# Patient Record
Sex: Male | Born: 1935
Health system: Southern US, Community
[De-identification: ages and names within clinical notes are randomized; demographics above are authoritative.]

## PROBLEM LIST (undated history)

## (undated) DIAGNOSIS — I428 Other cardiomyopathies: Secondary | ICD-10-CM

## (undated) DIAGNOSIS — E785 Hyperlipidemia, unspecified: Secondary | ICD-10-CM

## (undated) DIAGNOSIS — C9000 Multiple myeloma not having achieved remission: Secondary | ICD-10-CM

## (undated) DIAGNOSIS — R42 Dizziness and giddiness: Secondary | ICD-10-CM

## (undated) DIAGNOSIS — G629 Polyneuropathy, unspecified: Secondary | ICD-10-CM

## (undated) DIAGNOSIS — I10 Essential (primary) hypertension: Secondary | ICD-10-CM

## (undated) DIAGNOSIS — E119 Type 2 diabetes mellitus without complications: Secondary | ICD-10-CM

## (undated) DIAGNOSIS — I5042 Chronic combined systolic (congestive) and diastolic (congestive) heart failure: Secondary | ICD-10-CM

## (undated) DIAGNOSIS — R600 Localized edema: Secondary | ICD-10-CM

## (undated) DIAGNOSIS — N183 Chronic kidney disease, stage 3 unspecified: Secondary | ICD-10-CM

## (undated) DIAGNOSIS — I453 Trifascicular block: Secondary | ICD-10-CM

## (undated) DIAGNOSIS — I639 Cerebral infarction, unspecified: Secondary | ICD-10-CM

## (undated) DIAGNOSIS — I00 Rheumatic fever without heart involvement: Secondary | ICD-10-CM

## (undated) DIAGNOSIS — I2510 Atherosclerotic heart disease of native coronary artery without angina pectoris: Secondary | ICD-10-CM

## (undated) DIAGNOSIS — C61 Malignant neoplasm of prostate: Secondary | ICD-10-CM

## (undated) HISTORY — DX: Localized edema: R60.0

## (undated) HISTORY — DX: Hyperlipidemia, unspecified: E78.5

## (undated) HISTORY — DX: Trifascicular block: I45.3

## (undated) HISTORY — DX: Rheumatic fever without heart involvement: I00

## (undated) HISTORY — DX: Other cardiomyopathies: I42.8

## (undated) HISTORY — DX: Polyneuropathy, unspecified: G62.9

## (undated) HISTORY — DX: Malignant neoplasm of prostate: C61

## (undated) HISTORY — DX: Atherosclerotic heart disease of native coronary artery without angina pectoris: I25.10

## (undated) HISTORY — DX: Type 2 diabetes mellitus without complications: E11.9

## (undated) HISTORY — DX: Essential (primary) hypertension: I10

## (undated) HISTORY — DX: Chronic kidney disease, stage 3 unspecified: N18.30

## (undated) HISTORY — DX: Chronic combined systolic (congestive) and diastolic (congestive) heart failure: I50.42

## (undated) HISTORY — DX: Chronic kidney disease, stage 3 (moderate): N18.3

## (undated) NOTE — *Deleted (*Deleted)
***In Progress*** PCP:  [name], MD        Cardiologist:  [name], MD HF Cardiology: Dr Aundra Dubin   HPI: Allen Dyer is a 89 y.o. male with a history of DM, rheumatic fever, HTN, hyperlipidemia, CKD 3, prostate CA s/p XRT, systolic HF due to NICM (EF 30-35%) 05/2018,pulmonary venous HTN, and mild nonobstructive CAD.  Admitted 03/11-03/18/20 with volume overload. Diuresed with IV lasix. HF team consulted with concern for pulmonary HTN due to ongoing oxygen need. RHC completed and showed moderate pulmonary venousHTN with normal PVR 1.7 (no PAH). Transitioned from Lasix to torsemide 40 mg BID at DC. Oxygen weaned off. HF medications optimized as able. Limited by CKD. DC weight 213 lbs.  Patient was diagnosed with smoldering myeloma in 10/2018, no treatment planned at this time.   Cardiolite in 05/2019 showed EF 36%, fixed anteroapical defect, no ischemia.   Returned for followup of CHF with Dr. Aundra Dubin on 11/21/19.  He was using CPAP.  No chest pain.  No exertional dyspnea.  No palpitations.  No orthopnea/PND.  Main complaint had been low BP at times.  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  He got lightheaded, nurse checked BP and found it to be 86/50.  He got lightheaded again the following Tuesday, SBP in 80s when he checked.  His home SBP readings ranged from 80s-130s.  BP was good. No syncope, no falls.  Weight was stable. No palpitations.   Recently returned to HF clinic for pharmacist medication titration. At previous visit with MD, carvedilol was reduced to 12.5 mg BID and hydralazine was reduced to 50 mg TID due to periodic symptomatic hypotension. Overall, he felt well. Denied dizziness/lightheadedness or orthostasis. No instances of SBP <100. No fatigue. Denied chest pain or palpitations. Able to walk a few miles before getting SOB. Reported taking torsemide 40 mg BID and denied need for additional diuretic dose. No LEE on exam.  Denied PND/Orthopnea. No problem affording or obtaining medications.  Today he returns to HF clinic for pharmacist medication titration. At last visit with pharmacy clinic, Imdur was discontinued due to contraindication of coadministration with Cialis that was likely causing his orthostatic hypotension. Additionally, carvedilol was increased to 18.75 mg BID.   HF Medications: Carvedilol 18.75 mg BID Spironolactone 25 mg daily Hydralazine 50 mg BID Imdur 90 mg daily  Torsemide 40 mg BID  Has the patient been experiencing any side effects to the medications prescribed?  no  Does the patient have any problems obtaining medications due to transportation or finances?  No- [employer] Prescription insurance  And Medicare Part A/B  Understanding of regimen: good Understanding of indications: good Potential of compliance: good Patient understands to avoid NSAIDs. Patient understands to avoid decongestants.    Pertinent Lab Values: 12/02/2019 . Serum creatinine 2.54, BUN 32, Potassium 3.9, Sodium 142, BNP 139 pg/mL (09/18/2019)  Vital Signs: . Weight: 227.6 lbs (last clinic weight: 223 lbs) . Blood pressure: 152/86  - home BP reading this morning 131/60 after taking medications. Patient endorses history of white coat hypertension. Marland Kitchen Heart rate: 66 bpm  Assessment: 1. Chronic systolic HF:  New diagnosis 05/2018, EF 30-35% by ECHO.Due to NICM. LHC 05/2018 with mild non-osbtructive CAD (50% proximal LAD stenosis).Has had high BP for a long time, but it has generally been controlled recently. Had palpitations in past, but very few PVCs on telemetry when in the hospital. Prior hx of ETOH abuse, but none  in 40+ years. Did not receive chemotherapy with prior prostate cancer. QRS is wide on EKG, but it is RBBB. He has multiple myeloma but he does not have ventricular wall thickening that would be suggestive of cardiac amyloidosis.  [place] 06/2018 with mildly elevated filling pressures, CO preserved,  pulmonary venous HTN. No PAH. ECHO in 10/2018 showed that EF remains low at 25-30% with moderate LV dilation, no LVH, and normal RV.   - NYHA class II symptoms.  He is not volume overloaded on exam.    - Continue torsemide 40 mg BID - Increase carvedilol to 18.75 mg BID - No ACE/ARB/ARNI with CKD stage 3 -Continuespironolactone25mg  qHS - Continue hydralazine 50 mg TID  - Discontinue Imdur 90 mg daily due to contraindication of coadministration with Cialis likely causing his orthostatic hypotension. - With advanced age and nonischemic etiology of cardiomyopathy, would avoid ICD.  He has RBBB so would not likely benefit from CRT.  - Repeat echo at 3 month followup.   2. CKD Stage 3: Creatinine stable at 2.54 on 12/02/2019. - Referred to nephrology.   3. CAD: 50% pLAD on LHC in 05/2018. Cardiolite in 05/2019 showed no ischemia.   - Has not been able to tolerate statins but LDL has been low.  -ContinueASA 81 mg daily.   4. Severe OSA: Continue CPAP  5. Pulmonary hypertension: He had pulmonary venous hypertension based on RHC.   6. Smoldering myeloma:  No plans for chemotherapy treatment for now.     Plan: 1) Medication changes: Based on clinical presentation, vital signs and recent labs will *** 2) Follow-up in 4 weeks with Dr. Rush Farmer, PharmD, BCPS, [facility], [facility] Pharmacist [phone]

---
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
## 2016-04-25 DIAGNOSIS — I83893 Varicose veins of bilateral lower extremities with other complications: Secondary | ICD-10-CM | POA: Diagnosis not present

## 2016-04-26 DIAGNOSIS — I129 Hypertensive chronic kidney disease with stage 1 through stage 4 chronic kidney disease, or unspecified chronic kidney disease: Secondary | ICD-10-CM | POA: Diagnosis not present

## 2016-04-26 DIAGNOSIS — N183 Chronic kidney disease, stage 3 (moderate): Secondary | ICD-10-CM | POA: Diagnosis not present

## 2016-04-26 DIAGNOSIS — N179 Acute kidney failure, unspecified: Secondary | ICD-10-CM | POA: Diagnosis not present

## 2016-04-26 DIAGNOSIS — R609 Edema, unspecified: Secondary | ICD-10-CM | POA: Diagnosis not present

## 2016-04-26 DIAGNOSIS — E1122 Type 2 diabetes mellitus with diabetic chronic kidney disease: Secondary | ICD-10-CM | POA: Diagnosis not present

## 2016-05-01 DIAGNOSIS — I872 Venous insufficiency (chronic) (peripheral): Secondary | ICD-10-CM | POA: Diagnosis not present

## 2016-05-01 DIAGNOSIS — I83893 Varicose veins of bilateral lower extremities with other complications: Secondary | ICD-10-CM | POA: Diagnosis not present

## 2016-05-04 DIAGNOSIS — E119 Type 2 diabetes mellitus without complications: Secondary | ICD-10-CM | POA: Diagnosis not present

## 2016-05-04 DIAGNOSIS — M542 Cervicalgia: Secondary | ICD-10-CM | POA: Diagnosis not present

## 2016-05-05 DIAGNOSIS — E113593 Type 2 diabetes mellitus with proliferative diabetic retinopathy without macular edema, bilateral: Secondary | ICD-10-CM | POA: Diagnosis not present

## 2016-05-09 DIAGNOSIS — G5601 Carpal tunnel syndrome, right upper limb: Secondary | ICD-10-CM | POA: Diagnosis not present

## 2016-05-18 DIAGNOSIS — E119 Type 2 diabetes mellitus without complications: Secondary | ICD-10-CM | POA: Diagnosis not present

## 2016-05-18 DIAGNOSIS — N390 Urinary tract infection, site not specified: Secondary | ICD-10-CM | POA: Diagnosis not present

## 2016-05-18 DIAGNOSIS — R350 Frequency of micturition: Secondary | ICD-10-CM | POA: Diagnosis not present

## 2016-05-22 DIAGNOSIS — M79671 Pain in right foot: Secondary | ICD-10-CM | POA: Diagnosis not present

## 2016-05-22 DIAGNOSIS — L988 Other specified disorders of the skin and subcutaneous tissue: Secondary | ICD-10-CM | POA: Diagnosis not present

## 2016-05-22 DIAGNOSIS — R600 Localized edema: Secondary | ICD-10-CM | POA: Diagnosis not present

## 2016-05-22 DIAGNOSIS — M19071 Primary osteoarthritis, right ankle and foot: Secondary | ICD-10-CM | POA: Diagnosis not present

## 2016-05-22 DIAGNOSIS — M7751 Other enthesopathy of right foot: Secondary | ICD-10-CM | POA: Diagnosis not present

## 2016-05-22 DIAGNOSIS — L853 Xerosis cutis: Secondary | ICD-10-CM | POA: Diagnosis not present

## 2016-06-01 DIAGNOSIS — C61 Malignant neoplasm of prostate: Secondary | ICD-10-CM | POA: Diagnosis not present

## 2016-06-01 DIAGNOSIS — N3040 Irradiation cystitis without hematuria: Secondary | ICD-10-CM | POA: Diagnosis not present

## 2016-06-01 DIAGNOSIS — N3000 Acute cystitis without hematuria: Secondary | ICD-10-CM | POA: Diagnosis not present

## 2016-06-01 DIAGNOSIS — N451 Epididymitis: Secondary | ICD-10-CM | POA: Diagnosis not present

## 2016-06-02 DIAGNOSIS — N3941 Urge incontinence: Secondary | ICD-10-CM | POA: Diagnosis not present

## 2016-06-02 DIAGNOSIS — R3915 Urgency of urination: Secondary | ICD-10-CM | POA: Diagnosis not present

## 2016-06-19 DIAGNOSIS — I8311 Varicose veins of right lower extremity with inflammation: Secondary | ICD-10-CM | POA: Diagnosis not present

## 2016-06-19 DIAGNOSIS — I83893 Varicose veins of bilateral lower extremities with other complications: Secondary | ICD-10-CM | POA: Diagnosis not present

## 2016-06-19 DIAGNOSIS — I872 Venous insufficiency (chronic) (peripheral): Secondary | ICD-10-CM | POA: Diagnosis not present

## 2016-06-19 DIAGNOSIS — I8312 Varicose veins of left lower extremity with inflammation: Secondary | ICD-10-CM | POA: Diagnosis not present

## 2016-06-26 DIAGNOSIS — I83893 Varicose veins of bilateral lower extremities with other complications: Secondary | ICD-10-CM | POA: Diagnosis not present

## 2016-07-13 DIAGNOSIS — N3040 Irradiation cystitis without hematuria: Secondary | ICD-10-CM | POA: Diagnosis not present

## 2016-07-13 DIAGNOSIS — C61 Malignant neoplasm of prostate: Secondary | ICD-10-CM | POA: Diagnosis not present

## 2016-07-13 DIAGNOSIS — N451 Epididymitis: Secondary | ICD-10-CM | POA: Diagnosis not present

## 2016-07-13 DIAGNOSIS — N3000 Acute cystitis without hematuria: Secondary | ICD-10-CM | POA: Diagnosis not present

## 2016-07-17 DIAGNOSIS — I872 Venous insufficiency (chronic) (peripheral): Secondary | ICD-10-CM | POA: Diagnosis not present

## 2016-07-17 DIAGNOSIS — I83892 Varicose veins of left lower extremities with other complications: Secondary | ICD-10-CM | POA: Diagnosis not present

## 2016-07-25 DIAGNOSIS — N3000 Acute cystitis without hematuria: Secondary | ICD-10-CM | POA: Diagnosis not present

## 2016-08-02 DIAGNOSIS — H4321 Crystalline deposits in vitreous body, right eye: Secondary | ICD-10-CM | POA: Diagnosis not present

## 2016-08-02 DIAGNOSIS — E113593 Type 2 diabetes mellitus with proliferative diabetic retinopathy without macular edema, bilateral: Secondary | ICD-10-CM | POA: Diagnosis not present

## 2016-08-15 DIAGNOSIS — Z7982 Long term (current) use of aspirin: Secondary | ICD-10-CM | POA: Diagnosis not present

## 2016-08-15 DIAGNOSIS — M1990 Unspecified osteoarthritis, unspecified site: Secondary | ICD-10-CM | POA: Diagnosis not present

## 2016-08-15 DIAGNOSIS — R351 Nocturia: Secondary | ICD-10-CM | POA: Diagnosis not present

## 2016-08-15 DIAGNOSIS — Z08 Encounter for follow-up examination after completed treatment for malignant neoplasm: Secondary | ICD-10-CM | POA: Diagnosis not present

## 2016-08-15 DIAGNOSIS — Z79818 Long term (current) use of other agents affecting estrogen receptors and estrogen levels: Secondary | ICD-10-CM | POA: Diagnosis not present

## 2016-08-15 DIAGNOSIS — Z7984 Long term (current) use of oral hypoglycemic drugs: Secondary | ICD-10-CM | POA: Diagnosis not present

## 2016-08-15 DIAGNOSIS — N400 Enlarged prostate without lower urinary tract symptoms: Secondary | ICD-10-CM | POA: Diagnosis not present

## 2016-08-15 DIAGNOSIS — Z79899 Other long term (current) drug therapy: Secondary | ICD-10-CM | POA: Diagnosis not present

## 2016-08-15 DIAGNOSIS — C61 Malignant neoplasm of prostate: Secondary | ICD-10-CM | POA: Diagnosis not present

## 2016-08-15 DIAGNOSIS — I10 Essential (primary) hypertension: Secondary | ICD-10-CM | POA: Diagnosis not present

## 2016-08-15 DIAGNOSIS — E119 Type 2 diabetes mellitus without complications: Secondary | ICD-10-CM | POA: Diagnosis not present

## 2016-08-18 DIAGNOSIS — E119 Type 2 diabetes mellitus without complications: Secondary | ICD-10-CM | POA: Diagnosis not present

## 2016-08-18 DIAGNOSIS — Z08 Encounter for follow-up examination after completed treatment for malignant neoplasm: Secondary | ICD-10-CM | POA: Diagnosis not present

## 2016-08-18 DIAGNOSIS — C61 Malignant neoplasm of prostate: Secondary | ICD-10-CM | POA: Diagnosis not present

## 2016-08-18 DIAGNOSIS — Z7984 Long term (current) use of oral hypoglycemic drugs: Secondary | ICD-10-CM | POA: Diagnosis not present

## 2016-08-18 DIAGNOSIS — I10 Essential (primary) hypertension: Secondary | ICD-10-CM | POA: Diagnosis not present

## 2016-08-18 DIAGNOSIS — Z79818 Long term (current) use of other agents affecting estrogen receptors and estrogen levels: Secondary | ICD-10-CM | POA: Diagnosis not present

## 2016-09-01 DIAGNOSIS — H6123 Impacted cerumen, bilateral: Secondary | ICD-10-CM | POA: Diagnosis not present

## 2016-09-04 DIAGNOSIS — E119 Type 2 diabetes mellitus without complications: Secondary | ICD-10-CM | POA: Diagnosis not present

## 2016-09-04 DIAGNOSIS — I83891 Varicose veins of right lower extremities with other complications: Secondary | ICD-10-CM | POA: Diagnosis not present

## 2016-09-04 DIAGNOSIS — I872 Venous insufficiency (chronic) (peripheral): Secondary | ICD-10-CM | POA: Diagnosis not present

## 2016-09-04 DIAGNOSIS — R600 Localized edema: Secondary | ICD-10-CM | POA: Diagnosis not present

## 2016-09-11 DIAGNOSIS — I10 Essential (primary) hypertension: Secondary | ICD-10-CM | POA: Diagnosis not present

## 2016-09-11 DIAGNOSIS — N644 Mastodynia: Secondary | ICD-10-CM | POA: Diagnosis not present

## 2016-09-11 DIAGNOSIS — N400 Enlarged prostate without lower urinary tract symptoms: Secondary | ICD-10-CM | POA: Diagnosis not present

## 2016-09-11 DIAGNOSIS — E119 Type 2 diabetes mellitus without complications: Secondary | ICD-10-CM | POA: Diagnosis not present

## 2016-09-11 DIAGNOSIS — G589 Mononeuropathy, unspecified: Secondary | ICD-10-CM | POA: Diagnosis not present

## 2016-10-10 DIAGNOSIS — N3000 Acute cystitis without hematuria: Secondary | ICD-10-CM | POA: Diagnosis not present

## 2016-10-13 DIAGNOSIS — I83892 Varicose veins of left lower extremities with other complications: Secondary | ICD-10-CM | POA: Diagnosis not present

## 2016-10-19 DIAGNOSIS — I8000 Phlebitis and thrombophlebitis of superficial vessels of unspecified lower extremity: Secondary | ICD-10-CM | POA: Diagnosis not present

## 2016-11-01 DIAGNOSIS — I8002 Phlebitis and thrombophlebitis of superficial vessels of left lower extremity: Secondary | ICD-10-CM | POA: Diagnosis not present

## 2016-11-02 DIAGNOSIS — N3040 Irradiation cystitis without hematuria: Secondary | ICD-10-CM | POA: Diagnosis not present

## 2016-11-02 DIAGNOSIS — N451 Epididymitis: Secondary | ICD-10-CM | POA: Diagnosis not present

## 2016-11-02 DIAGNOSIS — C61 Malignant neoplasm of prostate: Secondary | ICD-10-CM | POA: Diagnosis not present

## 2016-11-02 DIAGNOSIS — N3000 Acute cystitis without hematuria: Secondary | ICD-10-CM | POA: Diagnosis not present

## 2016-11-08 DIAGNOSIS — N644 Mastodynia: Secondary | ICD-10-CM | POA: Diagnosis not present

## 2016-11-08 DIAGNOSIS — Z8546 Personal history of malignant neoplasm of prostate: Secondary | ICD-10-CM | POA: Diagnosis not present

## 2016-11-08 DIAGNOSIS — N62 Hypertrophy of breast: Secondary | ICD-10-CM | POA: Diagnosis not present

## 2016-11-08 DIAGNOSIS — C61 Malignant neoplasm of prostate: Secondary | ICD-10-CM | POA: Diagnosis not present

## 2016-11-09 DIAGNOSIS — M65341 Trigger finger, right ring finger: Secondary | ICD-10-CM | POA: Diagnosis not present

## 2016-12-04 DIAGNOSIS — R600 Localized edema: Secondary | ICD-10-CM | POA: Diagnosis not present

## 2016-12-04 DIAGNOSIS — E119 Type 2 diabetes mellitus without complications: Secondary | ICD-10-CM | POA: Diagnosis not present

## 2016-12-04 DIAGNOSIS — G629 Polyneuropathy, unspecified: Secondary | ICD-10-CM | POA: Diagnosis not present

## 2016-12-04 DIAGNOSIS — I872 Venous insufficiency (chronic) (peripheral): Secondary | ICD-10-CM | POA: Diagnosis not present

## 2016-12-11 DIAGNOSIS — E119 Type 2 diabetes mellitus without complications: Secondary | ICD-10-CM | POA: Diagnosis not present

## 2016-12-11 DIAGNOSIS — N62 Hypertrophy of breast: Secondary | ICD-10-CM | POA: Diagnosis not present

## 2016-12-11 DIAGNOSIS — N644 Mastodynia: Secondary | ICD-10-CM | POA: Diagnosis not present

## 2016-12-11 DIAGNOSIS — H6120 Impacted cerumen, unspecified ear: Secondary | ICD-10-CM | POA: Diagnosis not present

## 2016-12-11 DIAGNOSIS — I129 Hypertensive chronic kidney disease with stage 1 through stage 4 chronic kidney disease, or unspecified chronic kidney disease: Secondary | ICD-10-CM | POA: Diagnosis not present

## 2016-12-11 DIAGNOSIS — N400 Enlarged prostate without lower urinary tract symptoms: Secondary | ICD-10-CM | POA: Diagnosis not present

## 2016-12-11 DIAGNOSIS — N179 Acute kidney failure, unspecified: Secondary | ICD-10-CM | POA: Diagnosis not present

## 2016-12-11 DIAGNOSIS — M5412 Radiculopathy, cervical region: Secondary | ICD-10-CM | POA: Diagnosis not present

## 2016-12-11 DIAGNOSIS — N183 Chronic kidney disease, stage 3 (moderate): Secondary | ICD-10-CM | POA: Diagnosis not present

## 2016-12-11 DIAGNOSIS — E1122 Type 2 diabetes mellitus with diabetic chronic kidney disease: Secondary | ICD-10-CM | POA: Diagnosis not present

## 2016-12-20 DIAGNOSIS — H9313 Tinnitus, bilateral: Secondary | ICD-10-CM | POA: Diagnosis not present

## 2016-12-20 DIAGNOSIS — H6123 Impacted cerumen, bilateral: Secondary | ICD-10-CM | POA: Diagnosis not present

## 2016-12-20 DIAGNOSIS — Z87891 Personal history of nicotine dependence: Secondary | ICD-10-CM | POA: Diagnosis not present

## 2016-12-22 DIAGNOSIS — N183 Chronic kidney disease, stage 3 (moderate): Secondary | ICD-10-CM | POA: Diagnosis not present

## 2016-12-22 DIAGNOSIS — R609 Edema, unspecified: Secondary | ICD-10-CM | POA: Diagnosis not present

## 2016-12-22 DIAGNOSIS — E1122 Type 2 diabetes mellitus with diabetic chronic kidney disease: Secondary | ICD-10-CM | POA: Diagnosis not present

## 2016-12-22 DIAGNOSIS — I129 Hypertensive chronic kidney disease with stage 1 through stage 4 chronic kidney disease, or unspecified chronic kidney disease: Secondary | ICD-10-CM | POA: Diagnosis not present

## 2017-01-01 DIAGNOSIS — R351 Nocturia: Secondary | ICD-10-CM | POA: Diagnosis not present

## 2017-01-01 DIAGNOSIS — N400 Enlarged prostate without lower urinary tract symptoms: Secondary | ICD-10-CM | POA: Diagnosis not present

## 2017-01-01 DIAGNOSIS — C61 Malignant neoplasm of prostate: Secondary | ICD-10-CM | POA: Diagnosis not present

## 2017-01-01 DIAGNOSIS — R32 Unspecified urinary incontinence: Secondary | ICD-10-CM | POA: Diagnosis not present

## 2017-01-10 DIAGNOSIS — Z23 Encounter for immunization: Secondary | ICD-10-CM | POA: Diagnosis not present

## 2017-02-13 DIAGNOSIS — Z7982 Long term (current) use of aspirin: Secondary | ICD-10-CM | POA: Diagnosis not present

## 2017-02-13 DIAGNOSIS — E119 Type 2 diabetes mellitus without complications: Secondary | ICD-10-CM | POA: Diagnosis not present

## 2017-02-13 DIAGNOSIS — Z7984 Long term (current) use of oral hypoglycemic drugs: Secondary | ICD-10-CM | POA: Diagnosis not present

## 2017-02-13 DIAGNOSIS — M1990 Unspecified osteoarthritis, unspecified site: Secondary | ICD-10-CM | POA: Diagnosis not present

## 2017-02-13 DIAGNOSIS — I10 Essential (primary) hypertension: Secondary | ICD-10-CM | POA: Diagnosis not present

## 2017-02-13 DIAGNOSIS — Z79899 Other long term (current) drug therapy: Secondary | ICD-10-CM | POA: Diagnosis not present

## 2017-02-13 DIAGNOSIS — C61 Malignant neoplasm of prostate: Secondary | ICD-10-CM | POA: Diagnosis not present

## 2017-02-15 DIAGNOSIS — M1990 Unspecified osteoarthritis, unspecified site: Secondary | ICD-10-CM | POA: Diagnosis not present

## 2017-02-15 DIAGNOSIS — C61 Malignant neoplasm of prostate: Secondary | ICD-10-CM | POA: Diagnosis not present

## 2017-02-15 DIAGNOSIS — Z7984 Long term (current) use of oral hypoglycemic drugs: Secondary | ICD-10-CM | POA: Diagnosis not present

## 2017-02-15 DIAGNOSIS — E119 Type 2 diabetes mellitus without complications: Secondary | ICD-10-CM | POA: Diagnosis not present

## 2017-02-15 DIAGNOSIS — Z7982 Long term (current) use of aspirin: Secondary | ICD-10-CM | POA: Diagnosis not present

## 2017-02-15 DIAGNOSIS — I10 Essential (primary) hypertension: Secondary | ICD-10-CM | POA: Diagnosis not present

## 2017-02-21 DIAGNOSIS — C61 Malignant neoplasm of prostate: Secondary | ICD-10-CM | POA: Diagnosis not present

## 2017-02-21 DIAGNOSIS — I10 Essential (primary) hypertension: Secondary | ICD-10-CM | POA: Diagnosis not present

## 2017-02-21 DIAGNOSIS — N189 Chronic kidney disease, unspecified: Secondary | ICD-10-CM | POA: Diagnosis not present

## 2017-02-21 DIAGNOSIS — E119 Type 2 diabetes mellitus without complications: Secondary | ICD-10-CM | POA: Diagnosis not present

## 2017-02-21 DIAGNOSIS — N400 Enlarged prostate without lower urinary tract symptoms: Secondary | ICD-10-CM | POA: Diagnosis not present

## 2017-02-22 DIAGNOSIS — N3040 Irradiation cystitis without hematuria: Secondary | ICD-10-CM | POA: Diagnosis not present

## 2017-02-22 DIAGNOSIS — N451 Epididymitis: Secondary | ICD-10-CM | POA: Diagnosis not present

## 2017-02-22 DIAGNOSIS — N3000 Acute cystitis without hematuria: Secondary | ICD-10-CM | POA: Diagnosis not present

## 2017-02-22 DIAGNOSIS — C61 Malignant neoplasm of prostate: Secondary | ICD-10-CM | POA: Diagnosis not present

## 2017-03-26 DIAGNOSIS — N62 Hypertrophy of breast: Secondary | ICD-10-CM | POA: Diagnosis not present

## 2017-03-26 DIAGNOSIS — Z8546 Personal history of malignant neoplasm of prostate: Secondary | ICD-10-CM | POA: Diagnosis not present

## 2017-03-30 ENCOUNTER — Ambulatory Visit: Payer: Self-pay | Admitting: Podiatry

## 2017-04-05 DIAGNOSIS — H6123 Impacted cerumen, bilateral: Secondary | ICD-10-CM | POA: Diagnosis not present

## 2017-04-18 ENCOUNTER — Ambulatory Visit (INDEPENDENT_AMBULATORY_CARE_PROVIDER_SITE_OTHER): Payer: Medicare Other | Admitting: Podiatry

## 2017-04-18 ENCOUNTER — Encounter: Payer: Self-pay | Admitting: Podiatry

## 2017-04-18 DIAGNOSIS — B351 Tinea unguium: Secondary | ICD-10-CM | POA: Diagnosis not present

## 2017-04-18 DIAGNOSIS — E1142 Type 2 diabetes mellitus with diabetic polyneuropathy: Secondary | ICD-10-CM | POA: Diagnosis not present

## 2017-04-18 DIAGNOSIS — M79675 Pain in left toe(s): Secondary | ICD-10-CM

## 2017-04-18 DIAGNOSIS — M79674 Pain in right toe(s): Secondary | ICD-10-CM

## 2017-04-18 DIAGNOSIS — I890 Lymphedema, not elsewhere classified: Secondary | ICD-10-CM

## 2017-04-18 NOTE — Progress Notes (Addendum)
This patient presents the office for preventative foot care services.  He says he has moved here from [place] and needs to see one of the podiatrists in [place].  He says his nails have grown thick and long and are painful walking and wearing his shoes.  He says he is a diabetic and is taking Januvia.  He also has a doctor treating his swollen legs.   He presents the office today for an evaluation and treatment of his diabetic feet   General Appearance  Alert, conversant and in no acute stress.  Vascular  Dorsalis pedis and posterior pulses are not  palpable  Bilaterally due to swelling.  Capillary return is within normal limits  bilaterally. Temperature is within normal limits  Bilaterally.  Neurologic  Senn-Weinstein monofilament wire test within normal limits  Right foot.  LOPS diminished left foot.. Muscle power within normal limits bilaterally.  Nails Thick disfigured discolored nails with subungual debris bilaterally from hallux to fifth toes bilaterally. No evidence of bacterial infection or drainage bilaterally.  Orthopedic  No limitations of motion of motion feet bilaterally.  No crepitus or effusions noted.  No bony pathology or digital deformities noted.  Skin  normotropic skin with no porokeratosis noted bilaterally.  No signs of infections or ulcers noted.  Masceration 4th interdigital space right foot.   Onychomycosis  B/L  Diabetes with neuropathy.   IE  Debridement of nails.  Padding for interdigital masceration right foot.  RTC 3 months  ABN signed for 2018.   Gardiner Barefoot DPM

## 2017-04-19 DIAGNOSIS — H6093 Unspecified otitis externa, bilateral: Secondary | ICD-10-CM | POA: Diagnosis not present

## 2017-04-19 DIAGNOSIS — H6123 Impacted cerumen, bilateral: Secondary | ICD-10-CM | POA: Diagnosis not present

## 2017-05-10 DIAGNOSIS — G5601 Carpal tunnel syndrome, right upper limb: Secondary | ICD-10-CM | POA: Diagnosis not present

## 2017-06-12 DIAGNOSIS — E119 Type 2 diabetes mellitus without complications: Secondary | ICD-10-CM | POA: Diagnosis not present

## 2017-06-12 DIAGNOSIS — I10 Essential (primary) hypertension: Secondary | ICD-10-CM | POA: Diagnosis not present

## 2017-06-12 DIAGNOSIS — C61 Malignant neoplasm of prostate: Secondary | ICD-10-CM | POA: Diagnosis not present

## 2017-06-12 DIAGNOSIS — I8390 Asymptomatic varicose veins of unspecified lower extremity: Secondary | ICD-10-CM | POA: Diagnosis not present

## 2017-06-12 DIAGNOSIS — N400 Enlarged prostate without lower urinary tract symptoms: Secondary | ICD-10-CM | POA: Diagnosis not present

## 2017-06-12 DIAGNOSIS — M1990 Unspecified osteoarthritis, unspecified site: Secondary | ICD-10-CM | POA: Diagnosis not present

## 2017-06-12 DIAGNOSIS — N189 Chronic kidney disease, unspecified: Secondary | ICD-10-CM | POA: Diagnosis not present

## 2017-06-12 DIAGNOSIS — G5760 Lesion of plantar nerve, unspecified lower limb: Secondary | ICD-10-CM | POA: Diagnosis not present

## 2017-06-19 DIAGNOSIS — I83893 Varicose veins of bilateral lower extremities with other complications: Secondary | ICD-10-CM | POA: Diagnosis not present

## 2017-07-17 ENCOUNTER — Encounter: Payer: Self-pay | Admitting: Podiatry

## 2017-07-17 ENCOUNTER — Ambulatory Visit (INDEPENDENT_AMBULATORY_CARE_PROVIDER_SITE_OTHER): Payer: Medicare Other | Admitting: Podiatry

## 2017-07-17 DIAGNOSIS — M79674 Pain in right toe(s): Secondary | ICD-10-CM | POA: Diagnosis not present

## 2017-07-17 DIAGNOSIS — I890 Lymphedema, not elsewhere classified: Secondary | ICD-10-CM

## 2017-07-17 DIAGNOSIS — M79675 Pain in left toe(s): Secondary | ICD-10-CM | POA: Diagnosis not present

## 2017-07-17 DIAGNOSIS — E1142 Type 2 diabetes mellitus with diabetic polyneuropathy: Secondary | ICD-10-CM

## 2017-07-17 DIAGNOSIS — B351 Tinea unguium: Secondary | ICD-10-CM | POA: Diagnosis not present

## 2017-07-17 NOTE — Progress Notes (Signed)
Complaint:  Visit Type: Patient returns to my office for continued preventative foot care services. Complaint: Patient states" my nails have grown long and thick and become painful to walk and wear shoes" Patient has been diagnosed with DM with no foot complications. The patient presents for preventative foot care services. No changes to ROS  Podiatric Exam: Vascular: dorsalis pedis and posterior tibial pulses are not  palpable bilateral. Capillary return is immediate. Temperature gradient is WNL. Skin turgor WNL  Sensorium: Normal Semmes Weinstein monofilament test. Normal tactile sensation bilaterally. Nail Exam: Pt has thick disfigured discolored nails with subungual debris noted bilateral entire nail hallux through fifth toenails Ulcer Exam: There is no evidence of ulcer or pre-ulcerative changes or infection. Orthopedic Exam: Muscle tone and strength are WNL. No limitations in general ROM. No crepitus or effusions noted. Foot type and digits show no abnormalities. Masceration 4th interspace right foot. Skin: No Porokeratosis. No infection or ulcers  Diagnosis:  Onychomycosis, , Pain in right toe, pain in left toes  Treatment & Plan Procedures and Treatment: Consent by patient was obtained for treatment procedures.   Debridement of mycotic and hypertrophic toenails, 1 through 5 bilateral and clearing of subungual debris. No ulceration, no infection noted. Gentian violet 4th interspace right foot. ABN signed for 2019. Return Visit-Office Procedure: Patient instructed to return to the office for a follow up visit 3 months for continued evaluation and treatment.    Mistey Hoffert DPM 

## 2017-08-13 DIAGNOSIS — I129 Hypertensive chronic kidney disease with stage 1 through stage 4 chronic kidney disease, or unspecified chronic kidney disease: Secondary | ICD-10-CM | POA: Diagnosis not present

## 2017-08-13 DIAGNOSIS — N183 Chronic kidney disease, stage 3 (moderate): Secondary | ICD-10-CM | POA: Diagnosis not present

## 2017-08-13 DIAGNOSIS — E1122 Type 2 diabetes mellitus with diabetic chronic kidney disease: Secondary | ICD-10-CM | POA: Diagnosis not present

## 2017-08-21 DIAGNOSIS — Z8546 Personal history of malignant neoplasm of prostate: Secondary | ICD-10-CM | POA: Diagnosis not present

## 2017-08-21 DIAGNOSIS — M1990 Unspecified osteoarthritis, unspecified site: Secondary | ICD-10-CM | POA: Diagnosis not present

## 2017-08-21 DIAGNOSIS — N400 Enlarged prostate without lower urinary tract symptoms: Secondary | ICD-10-CM | POA: Diagnosis not present

## 2017-08-21 DIAGNOSIS — Z7984 Long term (current) use of oral hypoglycemic drugs: Secondary | ICD-10-CM | POA: Diagnosis not present

## 2017-08-21 DIAGNOSIS — I83893 Varicose veins of bilateral lower extremities with other complications: Secondary | ICD-10-CM | POA: Diagnosis not present

## 2017-08-21 DIAGNOSIS — E119 Type 2 diabetes mellitus without complications: Secondary | ICD-10-CM | POA: Diagnosis not present

## 2017-08-21 DIAGNOSIS — I10 Essential (primary) hypertension: Secondary | ICD-10-CM | POA: Diagnosis not present

## 2017-08-21 DIAGNOSIS — N401 Enlarged prostate with lower urinary tract symptoms: Secondary | ICD-10-CM | POA: Diagnosis not present

## 2017-08-21 DIAGNOSIS — Z888 Allergy status to other drugs, medicaments and biological substances status: Secondary | ICD-10-CM | POA: Diagnosis not present

## 2017-08-21 DIAGNOSIS — Z08 Encounter for follow-up examination after completed treatment for malignant neoplasm: Secondary | ICD-10-CM | POA: Diagnosis not present

## 2017-08-21 DIAGNOSIS — Z882 Allergy status to sulfonamides status: Secondary | ICD-10-CM | POA: Diagnosis not present

## 2017-08-21 DIAGNOSIS — R351 Nocturia: Secondary | ICD-10-CM | POA: Diagnosis not present

## 2017-08-21 DIAGNOSIS — Z923 Personal history of irradiation: Secondary | ICD-10-CM | POA: Diagnosis not present

## 2017-08-21 DIAGNOSIS — C61 Malignant neoplasm of prostate: Secondary | ICD-10-CM | POA: Diagnosis not present

## 2017-08-21 DIAGNOSIS — I872 Venous insufficiency (chronic) (peripheral): Secondary | ICD-10-CM | POA: Diagnosis not present

## 2017-08-21 DIAGNOSIS — Z79899 Other long term (current) drug therapy: Secondary | ICD-10-CM | POA: Diagnosis not present

## 2017-08-21 DIAGNOSIS — Z7982 Long term (current) use of aspirin: Secondary | ICD-10-CM | POA: Diagnosis not present

## 2017-08-22 DIAGNOSIS — E1122 Type 2 diabetes mellitus with diabetic chronic kidney disease: Secondary | ICD-10-CM | POA: Diagnosis not present

## 2017-08-22 DIAGNOSIS — I129 Hypertensive chronic kidney disease with stage 1 through stage 4 chronic kidney disease, or unspecified chronic kidney disease: Secondary | ICD-10-CM | POA: Diagnosis not present

## 2017-08-22 DIAGNOSIS — N183 Chronic kidney disease, stage 3 (moderate): Secondary | ICD-10-CM | POA: Diagnosis not present

## 2017-08-22 DIAGNOSIS — R609 Edema, unspecified: Secondary | ICD-10-CM | POA: Diagnosis not present

## 2017-08-22 DIAGNOSIS — N179 Acute kidney failure, unspecified: Secondary | ICD-10-CM | POA: Diagnosis not present

## 2017-09-11 DIAGNOSIS — N401 Enlarged prostate with lower urinary tract symptoms: Secondary | ICD-10-CM | POA: Diagnosis not present

## 2017-09-11 DIAGNOSIS — D508 Other iron deficiency anemias: Secondary | ICD-10-CM | POA: Diagnosis not present

## 2017-09-11 DIAGNOSIS — E119 Type 2 diabetes mellitus without complications: Secondary | ICD-10-CM | POA: Diagnosis not present

## 2017-09-11 DIAGNOSIS — I10 Essential (primary) hypertension: Secondary | ICD-10-CM | POA: Diagnosis not present

## 2017-09-11 DIAGNOSIS — E785 Hyperlipidemia, unspecified: Secondary | ICD-10-CM | POA: Diagnosis not present

## 2017-09-11 DIAGNOSIS — N3281 Overactive bladder: Secondary | ICD-10-CM | POA: Diagnosis not present

## 2017-09-11 DIAGNOSIS — N183 Chronic kidney disease, stage 3 (moderate): Secondary | ICD-10-CM | POA: Diagnosis not present

## 2017-09-25 DIAGNOSIS — C61 Malignant neoplasm of prostate: Secondary | ICD-10-CM | POA: Diagnosis not present

## 2017-10-02 DIAGNOSIS — Z8546 Personal history of malignant neoplasm of prostate: Secondary | ICD-10-CM | POA: Diagnosis not present

## 2017-10-17 ENCOUNTER — Ambulatory Visit (INDEPENDENT_AMBULATORY_CARE_PROVIDER_SITE_OTHER): Payer: Medicare Other | Admitting: Podiatry

## 2017-10-17 ENCOUNTER — Encounter: Payer: Self-pay | Admitting: Podiatry

## 2017-10-17 DIAGNOSIS — B351 Tinea unguium: Secondary | ICD-10-CM

## 2017-10-17 DIAGNOSIS — E1142 Type 2 diabetes mellitus with diabetic polyneuropathy: Secondary | ICD-10-CM

## 2017-10-17 DIAGNOSIS — M79674 Pain in right toe(s): Secondary | ICD-10-CM | POA: Diagnosis not present

## 2017-10-17 DIAGNOSIS — M79675 Pain in left toe(s): Secondary | ICD-10-CM | POA: Diagnosis not present

## 2017-10-17 NOTE — Progress Notes (Signed)
Complaint:  Visit Type: Patient returns to my office for continued preventative foot care services. Complaint: Patient states" my nails have grown long and thick and become painful to walk and wear shoes" Patient has been diagnosed with DM with no foot complications. The patient presents for preventative foot care services. No changes to ROS  Podiatric Exam: Vascular: dorsalis pedis and posterior tibial pulses are not  palpable bilateral. Capillary return is immediate. Temperature gradient is WNL. Skin turgor WNL  Sensorium: Normal Semmes Weinstein monofilament test. Normal tactile sensation bilaterally. Nail Exam: Pt has thick disfigured discolored nails with subungual debris noted bilateral entire nail hallux through fifth toenails Ulcer Exam: There is no evidence of ulcer or pre-ulcerative changes or infection. Orthopedic Exam: Muscle tone and strength are WNL. No limitations in general ROM. No crepitus or effusions noted. Foot type and digits show no abnormalities. Masceration 4th interspace right foot. Skin: No Porokeratosis. No infection or ulcers  Diagnosis:  Onychomycosis, , Pain in right toe, pain in left toes  Treatment & Plan Procedures and Treatment: Consent by patient was obtained for treatment procedures.   Debridement of mycotic and hypertrophic toenails, 1 through 5 bilateral and clearing of subungual debris. No ulceration, no infection noted. Gentian violet 4th interspace right foot. ABN signed for 2019. Return Visit-Office Procedure: Patient instructed to return to the office for a follow up visit 3 months for continued evaluation and treatment.    Gardiner Barefoot DPM

## 2017-11-05 DIAGNOSIS — H6123 Impacted cerumen, bilateral: Secondary | ICD-10-CM | POA: Diagnosis not present

## 2017-11-05 DIAGNOSIS — H6242 Otitis externa in other diseases classified elsewhere, left ear: Secondary | ICD-10-CM | POA: Diagnosis not present

## 2017-11-05 DIAGNOSIS — B369 Superficial mycosis, unspecified: Secondary | ICD-10-CM | POA: Diagnosis not present

## 2017-11-19 DIAGNOSIS — G5601 Carpal tunnel syndrome, right upper limb: Secondary | ICD-10-CM | POA: Diagnosis not present

## 2017-12-13 DIAGNOSIS — H43811 Vitreous degeneration, right eye: Secondary | ICD-10-CM | POA: Diagnosis not present

## 2017-12-13 DIAGNOSIS — E113593 Type 2 diabetes mellitus with proliferative diabetic retinopathy without macular edema, bilateral: Secondary | ICD-10-CM | POA: Diagnosis not present

## 2017-12-13 DIAGNOSIS — H4321 Crystalline deposits in vitreous body, right eye: Secondary | ICD-10-CM | POA: Diagnosis not present

## 2017-12-31 DIAGNOSIS — Z23 Encounter for immunization: Secondary | ICD-10-CM | POA: Diagnosis not present

## 2018-01-07 DIAGNOSIS — H6123 Impacted cerumen, bilateral: Secondary | ICD-10-CM | POA: Diagnosis not present

## 2018-01-08 DIAGNOSIS — E119 Type 2 diabetes mellitus without complications: Secondary | ICD-10-CM | POA: Diagnosis not present

## 2018-01-08 DIAGNOSIS — N182 Chronic kidney disease, stage 2 (mild): Secondary | ICD-10-CM | POA: Diagnosis not present

## 2018-01-08 DIAGNOSIS — I10 Essential (primary) hypertension: Secondary | ICD-10-CM | POA: Diagnosis not present

## 2018-01-08 DIAGNOSIS — N400 Enlarged prostate without lower urinary tract symptoms: Secondary | ICD-10-CM | POA: Diagnosis not present

## 2018-01-08 DIAGNOSIS — I2510 Atherosclerotic heart disease of native coronary artery without angina pectoris: Secondary | ICD-10-CM | POA: Diagnosis not present

## 2018-01-08 DIAGNOSIS — E1151 Type 2 diabetes mellitus with diabetic peripheral angiopathy without gangrene: Secondary | ICD-10-CM | POA: Diagnosis not present

## 2018-01-08 DIAGNOSIS — D508 Other iron deficiency anemias: Secondary | ICD-10-CM | POA: Diagnosis not present

## 2018-01-16 ENCOUNTER — Encounter: Payer: Self-pay | Admitting: Podiatry

## 2018-01-16 ENCOUNTER — Ambulatory Visit (INDEPENDENT_AMBULATORY_CARE_PROVIDER_SITE_OTHER): Payer: Medicare Other | Admitting: Podiatry

## 2018-01-16 DIAGNOSIS — M79675 Pain in left toe(s): Secondary | ICD-10-CM

## 2018-01-16 DIAGNOSIS — B351 Tinea unguium: Secondary | ICD-10-CM

## 2018-01-16 DIAGNOSIS — M79674 Pain in right toe(s): Secondary | ICD-10-CM

## 2018-01-16 DIAGNOSIS — E1142 Type 2 diabetes mellitus with diabetic polyneuropathy: Secondary | ICD-10-CM

## 2018-01-16 NOTE — Progress Notes (Signed)
Complaint:  Visit Type: Patient returns to my office for continued preventative foot care services. Complaint: Patient states" my nails have grown long and thick and become painful to walk and wear shoes" Patient has been diagnosed with DM with no foot complications. The patient presents for preventative foot care services. No changes to ROS  Podiatric Exam: Vascular: dorsalis pedis and posterior tibial pulses are not  palpable bilateral. Capillary return is immediate. Temperature gradient is WNL. Skin turgor WNL  Sensorium: Normal Semmes Weinstein monofilament test. Normal tactile sensation bilaterally. Nail Exam: Pt has thick disfigured discolored nails with subungual debris noted bilateral entire nail hallux through fifth toenails Ulcer Exam: There is no evidence of ulcer or pre-ulcerative changes or infection. Orthopedic Exam: Muscle tone and strength are WNL. No limitations in general ROM. No crepitus or effusions noted. Foot type and digits show no abnormalities. Skin: No Porokeratosis. No infection or ulcers  Diagnosis:  Onychomycosis, , Pain in right toe, pain in left toes  Treatment & Plan Procedures and Treatment: Consent by patient was obtained for treatment procedures.   Debridement of mycotic and hypertrophic toenails, 1 through 5 bilateral and clearing of subungual debris. No ulceration, no infection noted. ABN signed for 2019. Return Visit-Office Procedure: Patient instructed to return to the office for a follow up visit 3 months for continued evaluation and treatment.    Gardiner Barefoot DPM

## 2018-02-19 DIAGNOSIS — H6123 Impacted cerumen, bilateral: Secondary | ICD-10-CM | POA: Diagnosis not present

## 2018-02-25 DIAGNOSIS — C61 Malignant neoplasm of prostate: Secondary | ICD-10-CM | POA: Diagnosis not present

## 2018-03-07 DIAGNOSIS — Z6834 Body mass index (BMI) 34.0-34.9, adult: Secondary | ICD-10-CM | POA: Diagnosis not present

## 2018-03-07 DIAGNOSIS — M2550 Pain in unspecified joint: Secondary | ICD-10-CM | POA: Diagnosis not present

## 2018-03-07 DIAGNOSIS — E119 Type 2 diabetes mellitus without complications: Secondary | ICD-10-CM | POA: Diagnosis not present

## 2018-03-07 DIAGNOSIS — I10 Essential (primary) hypertension: Secondary | ICD-10-CM | POA: Diagnosis not present

## 2018-03-19 DIAGNOSIS — H6122 Impacted cerumen, left ear: Secondary | ICD-10-CM | POA: Diagnosis not present

## 2018-03-26 DIAGNOSIS — L218 Other seborrheic dermatitis: Secondary | ICD-10-CM | POA: Diagnosis not present

## 2018-03-26 DIAGNOSIS — M79641 Pain in right hand: Secondary | ICD-10-CM | POA: Diagnosis not present

## 2018-03-26 DIAGNOSIS — Z6835 Body mass index (BMI) 35.0-35.9, adult: Secondary | ICD-10-CM | POA: Diagnosis not present

## 2018-03-26 DIAGNOSIS — E119 Type 2 diabetes mellitus without complications: Secondary | ICD-10-CM | POA: Diagnosis not present

## 2018-04-02 DIAGNOSIS — C61 Malignant neoplasm of prostate: Secondary | ICD-10-CM | POA: Diagnosis not present

## 2018-04-09 DIAGNOSIS — R232 Flushing: Secondary | ICD-10-CM | POA: Diagnosis not present

## 2018-04-09 DIAGNOSIS — Z8546 Personal history of malignant neoplasm of prostate: Secondary | ICD-10-CM | POA: Diagnosis not present

## 2018-04-10 DIAGNOSIS — H6122 Impacted cerumen, left ear: Secondary | ICD-10-CM | POA: Diagnosis not present

## 2018-04-12 ENCOUNTER — Ambulatory Visit (INDEPENDENT_AMBULATORY_CARE_PROVIDER_SITE_OTHER): Payer: Medicare Other | Admitting: Podiatry

## 2018-04-12 ENCOUNTER — Encounter: Payer: Self-pay | Admitting: Podiatry

## 2018-04-12 DIAGNOSIS — E1142 Type 2 diabetes mellitus with diabetic polyneuropathy: Secondary | ICD-10-CM | POA: Diagnosis not present

## 2018-04-12 DIAGNOSIS — M79675 Pain in left toe(s): Secondary | ICD-10-CM | POA: Diagnosis not present

## 2018-04-12 DIAGNOSIS — M79674 Pain in right toe(s): Secondary | ICD-10-CM | POA: Diagnosis not present

## 2018-04-12 DIAGNOSIS — B351 Tinea unguium: Secondary | ICD-10-CM

## 2018-04-12 NOTE — Progress Notes (Signed)
Complaint:  Visit Type: Patient returns to my office for continued preventative foot care services. Complaint: Patient states" my nails have grown long and thick and become painful to walk and wear shoes" Patient has been diagnosed with DM with no foot complications. The patient presents for preventative foot care services. No changes to ROS  Podiatric Exam: Vascular: dorsalis pedis and posterior tibial pulses are not  palpable bilateral. Capillary return is immediate. Temperature gradient is WNL. Skin turgor WNL  Sensorium: Normal Semmes Weinstein monofilament test. Normal tactile sensation bilaterally. Nail Exam: Pt has thick disfigured discolored nails with subungual debris noted bilateral entire nail hallux through fifth toenails Ulcer Exam: There is no evidence of ulcer or pre-ulcerative changes or infection. Orthopedic Exam: Muscle tone and strength are WNL. No limitations in general ROM. No crepitus or effusions noted. Foot type and digits show no abnormalities. Skin: No Porokeratosis. No infection or ulcers  Diagnosis:  Onychomycosis, , Pain in right toe, pain in left toes  Treatment & Plan Procedures and Treatment: Consent by patient was obtained for treatment procedures.   Debridement of mycotic and hypertrophic toenails, 1 through 5 bilateral and clearing of subungual debris. No ulceration, no infection noted. ABN signed for 2019. Return Visit-Office Procedure: Patient instructed to return to the office for a follow up visit 3 months for continued evaluation and treatment.    Gardiner Barefoot DPM

## 2018-04-16 DIAGNOSIS — Z8546 Personal history of malignant neoplasm of prostate: Secondary | ICD-10-CM | POA: Diagnosis not present

## 2018-04-16 DIAGNOSIS — R600 Localized edema: Secondary | ICD-10-CM | POA: Diagnosis not present

## 2018-04-16 DIAGNOSIS — N401 Enlarged prostate with lower urinary tract symptoms: Secondary | ICD-10-CM | POA: Diagnosis not present

## 2018-04-16 DIAGNOSIS — E7800 Pure hypercholesterolemia, unspecified: Secondary | ICD-10-CM | POA: Diagnosis not present

## 2018-04-16 DIAGNOSIS — G8929 Other chronic pain: Secondary | ICD-10-CM | POA: Diagnosis not present

## 2018-04-16 DIAGNOSIS — Z7984 Long term (current) use of oral hypoglycemic drugs: Secondary | ICD-10-CM | POA: Diagnosis not present

## 2018-04-16 DIAGNOSIS — N644 Mastodynia: Secondary | ICD-10-CM | POA: Diagnosis not present

## 2018-04-16 DIAGNOSIS — N183 Chronic kidney disease, stage 3 (moderate): Secondary | ICD-10-CM | POA: Diagnosis not present

## 2018-04-16 DIAGNOSIS — R05 Cough: Secondary | ICD-10-CM | POA: Diagnosis not present

## 2018-04-16 DIAGNOSIS — E119 Type 2 diabetes mellitus without complications: Secondary | ICD-10-CM | POA: Diagnosis not present

## 2018-05-01 DIAGNOSIS — G5603 Carpal tunnel syndrome, bilateral upper limbs: Secondary | ICD-10-CM | POA: Diagnosis not present

## 2018-05-01 DIAGNOSIS — M5030 Other cervical disc degeneration, unspecified cervical region: Secondary | ICD-10-CM | POA: Diagnosis not present

## 2018-05-08 DIAGNOSIS — H6121 Impacted cerumen, right ear: Secondary | ICD-10-CM | POA: Diagnosis not present

## 2018-05-20 DIAGNOSIS — J90 Pleural effusion, not elsewhere classified: Secondary | ICD-10-CM | POA: Diagnosis not present

## 2018-05-20 DIAGNOSIS — R0602 Shortness of breath: Secondary | ICD-10-CM | POA: Diagnosis not present

## 2018-05-20 DIAGNOSIS — J811 Chronic pulmonary edema: Secondary | ICD-10-CM | POA: Diagnosis not present

## 2018-05-20 DIAGNOSIS — R600 Localized edema: Secondary | ICD-10-CM | POA: Diagnosis not present

## 2018-05-22 DIAGNOSIS — I5021 Acute systolic (congestive) heart failure: Secondary | ICD-10-CM | POA: Diagnosis not present

## 2018-05-24 DIAGNOSIS — N183 Chronic kidney disease, stage 3 (moderate): Secondary | ICD-10-CM | POA: Diagnosis not present

## 2018-05-24 DIAGNOSIS — I5021 Acute systolic (congestive) heart failure: Secondary | ICD-10-CM | POA: Diagnosis not present

## 2018-05-27 ENCOUNTER — Other Ambulatory Visit: Payer: Self-pay | Admitting: Family Medicine

## 2018-05-27 ENCOUNTER — Other Ambulatory Visit (HOSPITAL_COMMUNITY): Payer: Self-pay | Admitting: Family Medicine

## 2018-05-27 DIAGNOSIS — I5021 Acute systolic (congestive) heart failure: Secondary | ICD-10-CM

## 2018-05-29 ENCOUNTER — Ambulatory Visit (HOSPITAL_COMMUNITY): Payer: Medicare Other | Attending: Cardiovascular Disease

## 2018-05-29 DIAGNOSIS — I5021 Acute systolic (congestive) heart failure: Secondary | ICD-10-CM | POA: Diagnosis not present

## 2018-06-11 DIAGNOSIS — I83893 Varicose veins of bilateral lower extremities with other complications: Secondary | ICD-10-CM | POA: Diagnosis not present

## 2018-06-11 DIAGNOSIS — I872 Venous insufficiency (chronic) (peripheral): Secondary | ICD-10-CM | POA: Diagnosis not present

## 2018-06-12 DIAGNOSIS — H6121 Impacted cerumen, right ear: Secondary | ICD-10-CM | POA: Diagnosis not present

## 2018-06-12 DIAGNOSIS — H938X2 Other specified disorders of left ear: Secondary | ICD-10-CM | POA: Diagnosis not present

## 2018-06-14 ENCOUNTER — Encounter: Payer: Self-pay | Admitting: Cardiology

## 2018-06-14 ENCOUNTER — Ambulatory Visit (INDEPENDENT_AMBULATORY_CARE_PROVIDER_SITE_OTHER): Payer: Medicare Other | Admitting: Cardiology

## 2018-06-14 VITALS — BP 131/82 | HR 60 | Ht 67.5 in | Wt 221.8 lb

## 2018-06-14 DIAGNOSIS — I5022 Chronic systolic (congestive) heart failure: Secondary | ICD-10-CM

## 2018-06-14 DIAGNOSIS — N183 Chronic kidney disease, stage 3 unspecified: Secondary | ICD-10-CM

## 2018-06-14 DIAGNOSIS — E785 Hyperlipidemia, unspecified: Secondary | ICD-10-CM | POA: Insufficient documentation

## 2018-06-14 DIAGNOSIS — I00 Rheumatic fever without heart involvement: Secondary | ICD-10-CM | POA: Insufficient documentation

## 2018-06-14 DIAGNOSIS — I10 Essential (primary) hypertension: Secondary | ICD-10-CM | POA: Insufficient documentation

## 2018-06-14 DIAGNOSIS — G629 Polyneuropathy, unspecified: Secondary | ICD-10-CM

## 2018-06-14 DIAGNOSIS — R600 Localized edema: Secondary | ICD-10-CM | POA: Insufficient documentation

## 2018-06-14 DIAGNOSIS — I5042 Chronic combined systolic (congestive) and diastolic (congestive) heart failure: Secondary | ICD-10-CM | POA: Insufficient documentation

## 2018-06-14 DIAGNOSIS — C61 Malignant neoplasm of prostate: Secondary | ICD-10-CM | POA: Insufficient documentation

## 2018-06-14 DIAGNOSIS — E119 Type 2 diabetes mellitus without complications: Secondary | ICD-10-CM | POA: Insufficient documentation

## 2018-06-14 DIAGNOSIS — Z8546 Personal history of malignant neoplasm of prostate: Secondary | ICD-10-CM | POA: Insufficient documentation

## 2018-06-14 DIAGNOSIS — E1142 Type 2 diabetes mellitus with diabetic polyneuropathy: Secondary | ICD-10-CM | POA: Insufficient documentation

## 2018-06-14 DIAGNOSIS — N184 Chronic kidney disease, stage 4 (severe): Secondary | ICD-10-CM | POA: Insufficient documentation

## 2018-06-14 LAB — CBC
Hematocrit: 33.9 % — ABNORMAL LOW (ref 37.5–51.0)
Hemoglobin: 11 g/dL — ABNORMAL LOW (ref 13.0–17.7)
MCH: 30 pg (ref 26.6–33.0)
MCHC: 32.4 g/dL (ref 31.5–35.7)
MCV: 92 fL (ref 79–97)
Platelets: 214 10*3/uL (ref 150–450)
RBC: 3.67 x10E6/uL — ABNORMAL LOW (ref 4.14–5.80)
RDW: 14.7 % (ref 11.6–15.4)
WBC: 5.5 10*3/uL (ref 3.4–10.8)

## 2018-06-14 LAB — BASIC METABOLIC PANEL
BUN/Creatinine Ratio: 13 (ref 10–24)
BUN: 20 mg/dL (ref 8–27)
CO2: 27 mmol/L (ref 20–29)
Calcium: 10.1 mg/dL (ref 8.6–10.2)
Chloride: 103 mmol/L (ref 96–106)
Creatinine, Ser: 1.6 mg/dL — ABNORMAL HIGH (ref 0.76–1.27)
GFR calc Af Amer: 46 mL/min/{1.73_m2} — ABNORMAL LOW (ref >59)
GFR calc non Af Amer: 40 mL/min/{1.73_m2} — ABNORMAL LOW (ref >59)
Glucose: 126 mg/dL — ABNORMAL HIGH (ref 65–99)
Potassium: 3.9 mmol/L (ref 3.5–5.2)
Sodium: 148 mmol/L — ABNORMAL HIGH (ref 134–144)

## 2018-06-14 LAB — PRO B NATRIURETIC PEPTIDE: NT-Pro BNP: 2678 pg/mL — ABNORMAL HIGH (ref 0–486)

## 2018-06-14 MED ORDER — CARVEDILOL 6.25 MG PO TABS: 6.2500 mg | ORAL_TABLET | Freq: Two times a day (BID) | ORAL | 3 refills | Status: DC

## 2018-06-14 NOTE — Patient Instructions (Addendum)
Medication Instructions:  Stop: Metoprolol  Start: Carvedilol  If you need a refill on your cardiac medications before your next appointment, please call your pharmacy.   Lab work: Today: BMET, CBC, ProBNP  If you have labs (blood work) drawn today and your tests are completely normal, you will receive your results only by: Marland Kitchen MyChart Message (if you have MyChart) OR . A paper copy in the mail If you have any lab test that is abnormal or we need to change your treatment, we will call you to review the results.  Testing/Procedures: Your physician has requested that you have a cardiac catheterization. Cardiac catheterization is used to diagnose and/or treat various heart conditions. Doctors may recommend this procedure for a number of different reasons. The most common reason is to evaluate chest pain. Chest pain can be a symptom of coronary artery disease (CAD), and cardiac catheterization can show whether plaque is narrowing or blocking your heart's arteries. This procedure is also used to evaluate the valves, as well as measure the blood flow and oxygen levels in different parts of your heart. For further information please visit [email or web address]. Please follow instruction sheet, as given.  Follow-Up: Schedule a 2 weeks follow up with the PA.  Any Other Special Instructions Will Be Listed Below (If Applicable).    [facility] [place], [place] [address] Dept: [phone] Loc: [phone]  Allen Dyer  06/14/2018  You are scheduled for a Cardiac Catheterization on Tuesday, February 25 with Dr. Shelva Majestic.  1. Please arrive at the [facility] (Main Entrance A) at [facility]: [address] [place], [place] [address] at 5:30 AM (This time is two hours before your procedure to ensure your preparation). Free valet parking service is available.   Special note: Every  effort is made to have your procedure done on time. Please understand that emergencies sometimes delay scheduled procedures.  2. Diet: Do not eat solid foods after midnight.  The patient may have clear liquids until 5am upon the day of the procedure.  3. Labs: Completed at the office on 06/14/18  4. Medication instructions in preparation for your procedure:   Contrast Allergy: No  Stop taking, Lasix (Furosemide)  Monday, February 24,  Do not take Diabetes Med Glucophage (Metformin) on the day of the procedure and HOLD 48 HOURS AFTER THE PROCEDURE.  On the morning of your procedure, take your Aspirin and any morning medicines NOT listed above.  You may use sips of water.  5. Plan for one night stay--bring personal belongings. 6. Bring a current list of your medications and current insurance cards. 7. You MUST have a responsible person to drive you home. 8. Someone MUST be with you the first 24 hours after you arrive home or your discharge will be delayed. 9. Please wear clothes that are easy to get on and off and wear slip-on shoes.  Thank you for allowing [place] to care for you!   -- [facility]

## 2018-06-14 NOTE — H&P (View-Only) (Signed)
 Cardiology Office Note    Date:  06/14/2018   ID:  Allen Dyer, DOB 12/02/1935, MRN [id number]  PCP:  Koirala, Dibas, MD  Cardiologist:  Sarath Privott, MD   Chief Complaint  Patient presents with  . Congestive Heart Failure  . Shortness of Breath  . Hypertension    History of Present Illness:  Allen Dyer is a 83 y.o. male who is being seen today for the evaluation of CHF at the request of Koirala, Dibas, MD.  This is a 83-year-old male with a history of type 2 diabetes mellitus, rheumatic fever, hypertension, peripheral neuropathy with edema, hyperlipidemia, chronic CKD stage III, prostate CA status post XRT.  He was seen by PCP due to shortness of breath and lower extremity edema.  BNP was elevated at 2798 and found on chest x-ray 05/20/2018 to have pulmonary interstitial edema with small bilateral posterior pleural effusions.  He was diagnosed with acute CHF.  Started on Lasix with no significant improvement by PCP and on last office visit in January his Lasix was increased to 40 mg twice daily.  2D echo was done in our office 05/29/2018 showing moderate to severe LV dysfunction with EF 30 to 35% with moderately dilated LV and grade 2 diastolic dysfunction.  There was moderate pulmonary hypertension with PASP 59 mmHg.  Left atrium was moderately dilated and mild AI was seen.  He is now here for evaluation. He denies any chest pain or pressure.  He continues to have shortness of breath with associated episodes of PND and orthopnea.  He denies any dizziness, palpitations or syncope. He is compliant with his meds and is tolerating meds with no SE.    Past Medical History:  Diagnosis Date  . Chronic systolic CHF (congestive heart failure) (HCC)   . CKD (chronic kidney disease), stage III (HCC)   . Hyperlipidemia   . Hypertension   . Lower extremity edema   . Peripheral neuropathy   . Prostate cancer (HCC)    Status post XRT  . Rheumatic fever   . Type 2 diabetes mellitus (HCC)      History reviewed. No pertinent surgical history.  Current Medications: Current Meds  Medication Sig  . aspirin EC 81 MG tablet Take by mouth.  . Cholecalciferol (VITAMIN D3 PO) Take by mouth.  . Cyanocobalamin (B-12 PO) Take by mouth.  . FLUZONE HIGH-DOSE 0.5 ML injection ADM 0.5ML IM UTD  . furosemide (LASIX) 40 MG tablet Take 40 mg by mouth daily.  . gabapentin (NEURONTIN) 800 MG tablet Take by mouth.  . glipiZIDE (GLUCOTROL) 10 MG tablet Take by mouth.  . JANUVIA 25 MG tablet   . metFORMIN (GLUCOPHAGE) 500 MG tablet Take by mouth 2 (two) times daily with a meal.  . NIFEdipine (PROCARDIA-XL/ADALAT CC) 30 MG 24 hr tablet   . oxybutynin (DITROPAN-XL) 5 MG 24 hr tablet oxybutynin chloride ER 5 mg tablet,extended release 24 hr  1 tablet by mouth daily  . potassium chloride (K-DUR) 10 MEQ tablet Take by mouth.  . PRECISION XTRA TEST STRIPS test strip   . tamsulosin (FLOMAX) 0.4 MG CAPS capsule   . UNABLE TO FIND nifedipine ER 30 mg tablet,extended release  1 tablet by mouth daily  . [DISCONTINUED] metoprolol tartrate (LOPRESSOR) 50 MG tablet Take by mouth.    Allergies:   Tramadol; Finasteride; Lisinopril; Sulfa antibiotics; Ciprofloxacin; Simvastatin; and Sulfamethoxazole   Social History   Socioeconomic History  . Marital status: Widowed    Spouse name: Not on   file  . Number of children: Not on file  . Years of education: Not on file  . Highest education level: Not on file  Occupational History  . Not on file  Social Needs  . Financial resource strain: Not on file  . Food insecurity:    Worry: Not on file    Inability: Not on file  . Transportation needs:    Medical: Not on file    Non-medical: Not on file  Tobacco Use  . Smoking status: Never Smoker  . Smokeless tobacco: Never Used  Substance and Sexual Activity  . Alcohol use: Never    Frequency: Never  . Drug use: Never  . Sexual activity: Not on file  Lifestyle  . Physical activity:    Days per week:  Not on file    Minutes per session: Not on file  . Stress: Not on file  Relationships  . Social connections:    Talks on phone: Not on file    Gets together: Not on file    Attends religious service: Not on file    Active member of club or organization: Not on file    Attends meetings of clubs or organizations: Not on file    Relationship status: Not on file  Other Topics Concern  . Not on file  Social History Narrative  . Not on file     Family History:  The patient's family history is not on file.   ROS:   Please see the history of present illness.    ROS All other systems reviewed and are negative.  No flowsheet data found.  PHYSICAL EXAM:   VS:  BP 131/82   Pulse 60   Ht 5' 7.5" (1.715 m)   Wt 221 lb 12.8 oz (100.6 kg)   BMI 34.23 kg/m    GEN: Well nourished, well developed, in no acute distress  HEENT: normal  Neck: no JVD, carotid bruits, or masses Cardiac: RRR; no murmurs, rubs, or gallops,no edema.  Intact distal pulses bilaterally.  There is a fixed split S2 Respiratory:  clear to auscultation bilaterally, normal work of breathing GI: soft, nontender, nondistended, + BS MS: no deformity or atrophy  Skin: warm and dry, no rash Neuro:  Alert and Oriented x 3, Strength and sensation are intact Psych: euthymic mood, full affect  Wt Readings from Last 3 Encounters:  06/14/18 221 lb 12.8 oz (100.6 kg)      Studies/Labs Reviewed:   EKG:  EKG is ordered today.  The ekg ordered today demonstrates normal sinus rhythm with first-degree AV block, right bundle branch block and left anterior fascicular block.  Recent Labs: No results found for requested labs within last 8760 hours.   Lipid Panel No results found for: CHOL, TRIG, HDL, CHOLHDL, VLDL, LDLCALC, LDLDIRECT  Additional studies/ records that were reviewed today include:  Office notes from PCP    ASSESSMENT:    1. Chronic systolic CHF (congestive heart failure) (HCC)   2. Essential hypertension    3. CKD (chronic kidney disease), stage III (HCC)      PLAN:  In order of problems listed above:  1.  Acute systolic CHF - he recently presented with symptoms of volume overload and 2D echocardiogram showed moderate to severe LV dysfunction with moderately dilated LV and EF 30 to 35% with diffuse hypokinesis.  He also was noted to have moderate pulmonary hypertension.  He was started on Lasix 40 mg daily and increase to 40 mg twice daily   due to persistent volume overload for a few days and then back to 40 mg daily.  He had a.m. elevated BNP at 2700.  Etiology of cardiomyopathy unknown but he has multiple cardiac risk factors for CAD including diabetes mellitus, hypertension, hyperlipidemia.  He also has a strong family history of CAD with his father dying after multiple MIs at 50 and he has a brother who had CHF and unsure if he had coronary disease who died at 72.  I recommended that we proceed with right and left heart catheterization to define coronary anatomy and make sure he does not have severe three-vessel coronary disease resulting in ischemic cardiomyopathy.  We will also be able to assess his filling pressures.  For now he appears euvolemic on exam.  I am going to continue Lasix 40 mg daily.  Change Toprol to carvedilol 6.25 mg twice daily and titrate as blood pressure and heart rate allow.  I will get a be met today and if renal function is stable will start losartan 25 mg daily otherwise will start hydralazine/Imdur.  Cardiac catheterization was discussed with the patient fully. The patient understands that risks include but are not limited to stroke (1 in 1000), death (1 in 59), kidney failure [usually temporary] (1 in 500), bleeding (1 in 200), allergic reaction [possibly serious] (1 in 200).  The patient understands and is willing to proceed.    2.  HTN - BP is controlled on exam today.  I am going to have him stop his Lopressor and start him on carvedilol 6.25 mg twice daily.  If bmet  is stable today we will stop nifedipine and start him on losartan 25 mg daily otherwise if creatinine remains elevated will start on hydralazine and Imdur.  3.  CKD stage III -creatinine 1. 3 3 and potassium 3. 4 on 05/24/2018.  We will repeat bmet today.  Will need to follow closely with titration of heart failure medicines as well as post cardiac cath.    Medication Adjustments/Labs and Tests Ordered: Current medicines are reviewed at length with the patient today.  Concerns regarding medicines are outlined above.  Medication changes, Labs and Tests ordered today are listed in the Patient Instructions below.  Patient Instructions  Medication Instructions:  Stop: Metoprolol  Start: Carvedilol  If you need a refill on your cardiac medications before your next appointment, please call your pharmacy.   Lab work: Today: BMET, CBC, ProBNP  If you have labs (blood work) drawn today and your tests are completely normal, you will receive your results only by: Marland Kitchen MyChart Message (if you have MyChart) OR . A paper copy in the mail If you have any lab test that is abnormal or we need to change your treatment, we will call you to review the results.  Testing/Procedures: Your physician has requested that you have a cardiac catheterization. Cardiac catheterization is used to diagnose and/or treat various heart conditions. Doctors may recommend this procedure for a number of different reasons. The most common reason is to evaluate chest pain. Chest pain can be a symptom of coronary artery disease (CAD), and cardiac catheterization can show whether plaque is narrowing or blocking your heart's arteries. This procedure is also used to evaluate the valves, as well as measure the blood flow and oxygen levels in different parts of your heart. For further information please visit [email or web address]. Please follow instruction sheet, as given.  Follow-Up: Schedule a 2 weeks follow up with the PA.  Any Other  Special  Instructions Will Be Listed Below (If Applicable).    [facility] OFFICE [place], [place] [address] Dept: [phone] Loc: [phone]  Allen Dyer  06/14/2018  You are scheduled for a Cardiac Catheterization on Tuesday, February 25 with Dr. Shelva Majestic.  1. Please arrive at the [facility] (Main Entrance A) at [facility]: [address] [place], [place] [address] at 5:30 AM (This time is two hours before your procedure to ensure your preparation). Free valet parking service is available.   Special note: Every effort is made to have your procedure done on time. Please understand that emergencies sometimes delay scheduled procedures.  2. Diet: Do not eat solid foods after midnight.  The patient may have clear liquids until 5am upon the day of the procedure.  3. Labs: Completed at the office on 06/14/18  4. Medication instructions in preparation for your procedure:   Contrast Allergy: No   Current Outpatient Medications (Endocrine & Metabolic):  .  glipiZIDE (GLUCOTROL) 10 MG tablet, Take by mouth. Marland Kitchen  JANUVIA 25 MG tablet,  .  metFORMIN (GLUCOPHAGE) 500 MG tablet, Take by mouth 2 (two) times daily with a meal.  Current Outpatient Medications (Cardiovascular):  .  furosemide (LASIX) 40 MG tablet, Take 40 mg by mouth daily. Marland Kitchen  NIFEdipine (PROCARDIA-XL/ADALAT CC) 30 MG 24 hr tablet,  .  carvedilol (COREG) 6.25 MG tablet, Take 1 tablet (6.25 mg total) by mouth 2 (two) times daily.   Current Outpatient Medications (Analgesics):  .  aspirin EC 81 MG tablet, Take by mouth.  Current Outpatient Medications (Hematological):  Marland Kitchen  Cyanocobalamin (B-12 PO), Take by mouth.  Current Outpatient Medications (Other):  Marland Kitchen  Cholecalciferol (VITAMIN D3 PO), Take by mouth. Marland Kitchen  FLUZONE HIGH-DOSE 0.5 ML injection, ADM 0.5ML IM UTD .  gabapentin (NEURONTIN) 800 MG tablet, Take by  mouth. .  oxybutynin (DITROPAN-XL) 5 MG 24 hr tablet, oxybutynin chloride ER 5 mg tablet,extended release 24 hr  1 tablet by mouth daily .  potassium chloride (K-DUR) 10 MEQ tablet, Take by mouth. Marland Kitchen  PRECISION XTRA TEST STRIPS test strip,  .  tamsulosin (FLOMAX) 0.4 MG CAPS capsule,  .  UNABLE TO FIND, nifedipine ER 30 mg tablet,extended release  1 tablet by mouth daily *For reference purposes while preparing patient instructions.   Delete this med list prior to printing instructions for patient.*   Stop taking, Lasix (Furosemide)  Monday, February 24,  Do not take Diabetes Med Glucophage (Metformin) on the day of the procedure and HOLD 48 HOURS AFTER THE PROCEDURE.  On the morning of your procedure, take your Aspirin and any morning medicines NOT listed above.  You may use sips of water.  5. Plan for one night stay--bring personal belongings. 6. Bring a current list of your medications and current insurance cards. 7. You MUST have a responsible person to drive you home. 8. Someone MUST be with you the first 24 hours after you arrive home or your discharge will be delayed. 9. Please wear clothes that are easy to get on and off and wear slip-on shoes.  Thank you for allowing [place] to care for you!   -- [facility]       Signed, [name], MD  06/14/2018 12:13 PM    [address], [place], [place]  [address] Phone: [phone]; Fax: [phone]

## 2018-06-14 NOTE — Progress Notes (Signed)
Cardiology Office Note    Date:  06/14/2018   ID:  Allen Dyer, DOB [birth year], MRN [id number]  PCP:  [name], MD  Cardiologist:  Allen Him, MD   Chief Complaint  Patient presents with  . Congestive Heart Failure  . Shortness of Breath  . Hypertension    History of Present Illness:  Allen Dyer is a 83 y.o. male who is being seen today for the evaluation of CHF at the request of Koirala, Dibas, MD.  This is a 83 year old male with a history of type 2 diabetes mellitus, rheumatic fever, hypertension, peripheral neuropathy with edema, hyperlipidemia, chronic CKD stage III, prostate CA status post XRT.  He was seen by PCP due to shortness of breath and lower extremity edema.  BNP was elevated at 2798 and found on chest x-ray 05/20/2018 to have pulmonary interstitial edema with small bilateral posterior pleural effusions.  He was diagnosed with acute CHF.  Started on Lasix with no significant improvement by PCP and on last office visit in January his Lasix was increased to 40 mg twice daily.  2D echo was done in our office 05/29/2018 showing moderate to severe LV dysfunction with EF 30 to 35% with moderately dilated LV and grade 2 diastolic dysfunction.  There was moderate pulmonary hypertension with PASP 59 mmHg.  Left atrium was moderately dilated and mild AI was seen.  He is now here for evaluation. He denies any chest pain or pressure.  He continues to have shortness of breath with associated episodes of PND and orthopnea.  He denies any dizziness, palpitations or syncope. He is compliant with his meds and is tolerating meds with no SE.    Past Medical History:  Diagnosis Date  . Chronic systolic CHF (congestive heart failure) ([place])   . CKD (chronic kidney disease), stage III ([place])   . Hyperlipidemia   . Hypertension   . Lower extremity edema   . Peripheral neuropathy   . Prostate cancer ([place])    Status post XRT  . Rheumatic fever   . Type 2 diabetes mellitus ([place])      History reviewed. No pertinent surgical history.  Current Medications: Current Meds  Medication Sig  . aspirin EC 81 MG tablet Take by mouth.  . Cholecalciferol (VITAMIN D3 PO) Take by mouth.  . Cyanocobalamin (B-12 PO) Take by mouth.  Marland Kitchen FLUZONE HIGH-DOSE 0.5 ML injection ADM 0.5ML IM UTD  . furosemide (LASIX) 40 MG tablet Take 40 mg by mouth daily.  Marland Kitchen gabapentin (NEURONTIN) 800 MG tablet Take by mouth.  Marland Kitchen glipiZIDE (GLUCOTROL) 10 MG tablet Take by mouth.  Marland Kitchen JANUVIA 25 MG tablet   . metFORMIN (GLUCOPHAGE) 500 MG tablet Take by mouth 2 (two) times daily with a meal.  . NIFEdipine (PROCARDIA-XL/ADALAT CC) 30 MG 24 hr tablet   . oxybutynin (DITROPAN-XL) 5 MG 24 hr tablet oxybutynin chloride ER 5 mg tablet,extended release 24 hr  1 tablet by mouth daily  . potassium chloride (K-DUR) 10 MEQ tablet Take by mouth.  Marland Kitchen PRECISION XTRA TEST STRIPS test strip   . tamsulosin (FLOMAX) 0.4 MG CAPS capsule   . UNABLE TO FIND nifedipine ER 30 mg tablet,extended release  1 tablet by mouth daily  . [DISCONTINUED] metoprolol tartrate (LOPRESSOR) 50 MG tablet Take by mouth.    Allergies:   Tramadol; Finasteride; Lisinopril; Sulfa antibiotics; Ciprofloxacin; Simvastatin; and Sulfamethoxazole   Social History   Socioeconomic History  . Marital status: Widowed    Spouse name: Not on  file  . Number of children: Not on file  . Years of education: Not on file  . Highest education level: Not on file  Occupational History  . Not on file  Social Needs  . Financial resource strain: Not on file  . Food insecurity:    Worry: Not on file    Inability: Not on file  . Transportation needs:    Medical: Not on file    Non-medical: Not on file  Tobacco Use  . Smoking status: Never Smoker  . Smokeless tobacco: Never Used  Substance and Sexual Activity  . Alcohol use: Never    Frequency: Never  . Drug use: Never  . Sexual activity: Not on file  Lifestyle  . Physical activity:    Days per week:  Not on file    Minutes per session: Not on file  . Stress: Not on file  Relationships  . Social connections:    Talks on phone: Not on file    Gets together: Not on file    Attends religious service: Not on file    Active member of club or organization: Not on file    Attends meetings of clubs or organizations: Not on file    Relationship status: Not on file  Other Topics Concern  . Not on file  Social History Narrative  . Not on file     Family History:  The patient's family history is not on file.   ROS:   Please see the history of present illness.    ROS All other systems reviewed and are negative.  No flowsheet data found.  PHYSICAL EXAM:   VS:  BP 131/82   Pulse 60   Ht 5' 7.5" (1.715 m)   Wt 221 lb 12.8 oz (100.6 kg)   BMI 34.23 kg/m    GEN: Well nourished, well developed, in no acute distress  HEENT: normal  Neck: no JVD, carotid bruits, or masses Cardiac: RRR; no murmurs, rubs, or gallops,no edema.  Intact distal pulses bilaterally.  There is a fixed split S2 Respiratory:  clear to auscultation bilaterally, normal work of breathing GI: soft, nontender, nondistended, + BS MS: no deformity or atrophy  Skin: warm and dry, no rash Neuro:  Alert and Oriented x 3, Strength and sensation are intact Psych: euthymic mood, full affect  Wt Readings from Last 3 Encounters:  06/14/18 221 lb 12.8 oz (100.6 kg)      Studies/Labs Reviewed:   EKG:  EKG is ordered today.  The ekg ordered today demonstrates normal sinus rhythm with first-degree AV block, right bundle branch block and left anterior fascicular block.  Recent Labs: No results found for requested labs within last 8760 hours.   Lipid Panel No results found for: CHOL, TRIG, HDL, CHOLHDL, VLDL, LDLCALC, LDLDIRECT  Additional studies/ records that were reviewed today include:  Office notes from PCP    ASSESSMENT:    1. Chronic systolic CHF (congestive heart failure) ([place])   2. Essential hypertension    3. CKD (chronic kidney disease), stage III ([place])      PLAN:  In order of problems listed above:  1.  Acute systolic CHF - he recently presented with symptoms of volume overload and 2D echocardiogram showed moderate to severe LV dysfunction with moderately dilated LV and EF 30 to 35% with diffuse hypokinesis.  He also was noted to have moderate pulmonary hypertension.  He was started on Lasix 40 mg daily and increase to 40 mg twice daily  due to persistent volume overload for a few days and then back to 40 mg daily.  He had a.m. elevated BNP at 2700.  Etiology of cardiomyopathy unknown but he has multiple cardiac risk factors for CAD including diabetes mellitus, hypertension, hyperlipidemia.  He also has a strong family history of CAD with his father dying after multiple MIs at 50 and he has a brother who had CHF and unsure if he had coronary disease who died at 72.  I recommended that we proceed with right and left heart catheterization to define coronary anatomy and make sure he does not have severe three-vessel coronary disease resulting in ischemic cardiomyopathy.  We will also be able to assess his filling pressures.  For now he appears euvolemic on exam.  I am going to continue Lasix 40 mg daily.  Change Toprol to carvedilol 6.25 mg twice daily and titrate as blood pressure and heart rate allow.  I will get a be met today and if renal function is stable will start losartan 25 mg daily otherwise will start hydralazine/Imdur.  Cardiac catheterization was discussed with the patient fully. The patient understands that risks include but are not limited to stroke (1 in 1000), death (1 in 59), kidney failure [usually temporary] (1 in 500), bleeding (1 in 200), allergic reaction [possibly serious] (1 in 200).  The patient understands and is willing to proceed.    2.  HTN - BP is controlled on exam today.  I am going to have Dyer stop his Lopressor and start Dyer on carvedilol 6.25 mg twice daily.  If bmet  is stable today we will stop nifedipine and start Dyer on losartan 25 mg daily otherwise if creatinine remains elevated will start on hydralazine and Imdur.  3.  CKD stage III -creatinine 1. 3 3 and potassium 3. 4 on 05/24/2018.  We will repeat bmet today.  Will need to follow closely with titration of heart failure medicines as well as post cardiac cath.    Medication Adjustments/Labs and Tests Ordered: Current medicines are reviewed at length with the patient today.  Concerns regarding medicines are outlined above.  Medication changes, Labs and Tests ordered today are listed in the Patient Instructions below.  Patient Instructions  Medication Instructions:  Stop: Metoprolol  Start: Carvedilol  If you need a refill on your cardiac medications before your next appointment, please call your pharmacy.   Lab work: Today: BMET, CBC, ProBNP  If you have labs (blood work) drawn today and your tests are completely normal, you will receive your results only by: Marland Kitchen MyChart Message (if you have MyChart) OR . A paper copy in the mail If you have any lab test that is abnormal or we need to change your treatment, we will call you to review the results.  Testing/Procedures: Your physician has requested that you have a cardiac catheterization. Cardiac catheterization is used to diagnose and/or treat various heart conditions. Doctors may recommend this procedure for a number of different reasons. The most common reason is to evaluate chest pain. Chest pain can be a symptom of coronary artery disease (CAD), and cardiac catheterization can show whether plaque is narrowing or blocking your heart's arteries. This procedure is also used to evaluate the valves, as well as measure the blood flow and oxygen levels in different parts of your heart. For further information please visit [email or web address]. Please follow instruction sheet, as given.  Follow-Up: Schedule a 2 weeks follow up with the PA.  Any Other  Special  Instructions Will Be Listed Below (If Applicable).    [facility] OFFICE [place], [place] [address] Dept: [phone] Loc: [phone]  Allen Dyer  06/14/2018  You are scheduled for a Cardiac Catheterization on Tuesday, February 25 with Dr. Shelva Majestic.  1. Please arrive at the [facility] (Main Entrance A) at [facility]: [address] [place], [place] [address] at 5:30 AM (This time is two hours before your procedure to ensure your preparation). Free valet parking service is available.   Special note: Every effort is made to have your procedure done on time. Please understand that emergencies sometimes delay scheduled procedures.  2. Diet: Do not eat solid foods after midnight.  The patient may have clear liquids until 5am upon the day of the procedure.  3. Labs: Completed at the office on 06/14/18  4. Medication instructions in preparation for your procedure:   Contrast Allergy: No   Current Outpatient Medications (Endocrine & Metabolic):  .  glipiZIDE (GLUCOTROL) 10 MG tablet, Take by mouth. Marland Kitchen  JANUVIA 25 MG tablet,  .  metFORMIN (GLUCOPHAGE) 500 MG tablet, Take by mouth 2 (two) times daily with a meal.  Current Outpatient Medications (Cardiovascular):  .  furosemide (LASIX) 40 MG tablet, Take 40 mg by mouth daily. Marland Kitchen  NIFEdipine (PROCARDIA-XL/ADALAT CC) 30 MG 24 hr tablet,  .  carvedilol (COREG) 6.25 MG tablet, Take 1 tablet (6.25 mg total) by mouth 2 (two) times daily.   Current Outpatient Medications (Analgesics):  .  aspirin EC 81 MG tablet, Take by mouth.  Current Outpatient Medications (Hematological):  Marland Kitchen  Cyanocobalamin (B-12 PO), Take by mouth.  Current Outpatient Medications (Other):  Marland Kitchen  Cholecalciferol (VITAMIN D3 PO), Take by mouth. Marland Kitchen  FLUZONE HIGH-DOSE 0.5 ML injection, ADM 0.5ML IM UTD .  gabapentin (NEURONTIN) 800 MG tablet, Take by  mouth. .  oxybutynin (DITROPAN-XL) 5 MG 24 hr tablet, oxybutynin chloride ER 5 mg tablet,extended release 24 hr  1 tablet by mouth daily .  potassium chloride (K-DUR) 10 MEQ tablet, Take by mouth. Marland Kitchen  PRECISION XTRA TEST STRIPS test strip,  .  tamsulosin (FLOMAX) 0.4 MG CAPS capsule,  .  UNABLE TO FIND, nifedipine ER 30 mg tablet,extended release  1 tablet by mouth daily *For reference purposes while preparing patient instructions.   Delete this med list prior to printing instructions for patient.*   Stop taking, Lasix (Furosemide)  Monday, February 24,  Do not take Diabetes Med Glucophage (Metformin) on the day of the procedure and HOLD 48 HOURS AFTER THE PROCEDURE.  On the morning of your procedure, take your Aspirin and any morning medicines NOT listed above.  You may use sips of water.  5. Plan for one night stay--bring personal belongings. 6. Bring a current list of your medications and current insurance cards. 7. You MUST have a responsible person to drive you home. 8. Someone MUST be with you the first 24 hours after you arrive home or your discharge will be delayed. 9. Please wear clothes that are easy to get on and off and wear slip-on shoes.  Thank you for allowing [place] to care for you!   -- [facility]       Signed, Allen Him, MD  06/14/2018 12:13 PM    [address], [place], [place]  [address] Phone: [phone]; Fax: [phone]

## 2018-06-17 ENCOUNTER — Telehealth: Payer: Self-pay | Admitting: *Deleted

## 2018-06-17 DIAGNOSIS — H4321 Crystalline deposits in vitreous body, right eye: Secondary | ICD-10-CM | POA: Diagnosis not present

## 2018-06-17 DIAGNOSIS — H43811 Vitreous degeneration, right eye: Secondary | ICD-10-CM | POA: Diagnosis not present

## 2018-06-17 DIAGNOSIS — E113593 Type 2 diabetes mellitus with proliferative diabetic retinopathy without macular edema, bilateral: Secondary | ICD-10-CM | POA: Diagnosis not present

## 2018-06-17 MED ORDER — ISOSORBIDE MONONITRATE ER 30 MG PO TB24: ORAL_TABLET | ORAL | 0 refills | Status: DC

## 2018-06-17 MED ORDER — HYDRALAZINE HCL 10 MG PO TABS: 10.0000 mg | ORAL_TABLET | Freq: Three times a day (TID) | ORAL | 0 refills | Status: DC

## 2018-06-17 NOTE — Telephone Encounter (Signed)
Pt contacted pre-catheterization scheduled at [facility] for: Tuesday June 18, 2018 7:30 AM Verified arrival time and place: [facility] Entrance A at: 5:30 AM  No solid food after midnight prior to cath, clear liquids until 5 AM day of procedure.  Hold: Lasix-day before and day of procedure. KCl-day before and day of procedure. Metformin-day of procedure and 48 hours post procedure. Glipizide-AM of procedure.  Except hold medications AM meds can be  taken pre-cath with sip of water including: ASA 81 mg  Please drink fluids (water is best) in moderation today to hydrate prior to catheterization tomorrow.   Confirmed patient has responsible person to drive home post procedure and observe 24 hours after arriving home: yes   Notes below copied from lab results done 06/14/18: Notes recorded by [name], MD on 06/15/2018 at 10:22 PM EST Please have patient stop nifedipine. Creatinine mildly elevated. Please have patient start Hydralazine 10mg  TID and Imdur 15mg  daily. Hold Lasix the day before and the day of cardiac cath. Needs a stat BMET on arrival in day hospital for cath  I discussed these medication changes with patient today, he verbalized understanding, thanked me for call.  BMET ordered on arrival to hospital morning of cath.

## 2018-06-17 NOTE — Telephone Encounter (Signed)
I also discussed medication changes with patient's daughter, Verdene [facility]), she verbalized understanding, thanked me for call.

## 2018-06-18 ENCOUNTER — Encounter (HOSPITAL_COMMUNITY)
Admission: RE | Disposition: A | Discharge status: Home / Self Care | Payer: Self-pay | Source: Ambulatory Visit | Attending: Cardiovascular Disease

## 2018-06-18 ENCOUNTER — Other Ambulatory Visit: Payer: Self-pay

## 2018-06-18 ENCOUNTER — Inpatient Hospital Stay (HOSPITAL_COMMUNITY)
Admission: RE | Admit: 2018-06-18 | Discharge: 2018-06-20 | DRG: 286 | Disposition: A | Discharge status: Home / Self Care | Payer: Medicare Other | Source: Ambulatory Visit | Attending: Cardiovascular Disease | Admitting: Cardiovascular Disease

## 2018-06-18 DIAGNOSIS — R0602 Shortness of breath: Secondary | ICD-10-CM | POA: Diagnosis not present

## 2018-06-18 DIAGNOSIS — I272 Pulmonary hypertension, unspecified: Secondary | ICD-10-CM | POA: Diagnosis present

## 2018-06-18 DIAGNOSIS — Z7984 Long term (current) use of oral hypoglycemic drugs: Secondary | ICD-10-CM

## 2018-06-18 DIAGNOSIS — Z882 Allergy status to sulfonamides status: Secondary | ICD-10-CM

## 2018-06-18 DIAGNOSIS — G629 Polyneuropathy, unspecified: Secondary | ICD-10-CM | POA: Diagnosis present

## 2018-06-18 DIAGNOSIS — Z923 Personal history of irradiation: Secondary | ICD-10-CM

## 2018-06-18 DIAGNOSIS — Z8249 Family history of ischemic heart disease and other diseases of the circulatory system: Secondary | ICD-10-CM

## 2018-06-18 DIAGNOSIS — I428 Other cardiomyopathies: Secondary | ICD-10-CM | POA: Diagnosis present

## 2018-06-18 DIAGNOSIS — Z888 Allergy status to other drugs, medicaments and biological substances status: Secondary | ICD-10-CM | POA: Diagnosis not present

## 2018-06-18 DIAGNOSIS — I5023 Acute on chronic systolic (congestive) heart failure: Secondary | ICD-10-CM | POA: Diagnosis present

## 2018-06-18 DIAGNOSIS — I509 Heart failure, unspecified: Secondary | ICD-10-CM

## 2018-06-18 DIAGNOSIS — I452 Bifascicular block: Secondary | ICD-10-CM | POA: Diagnosis present

## 2018-06-18 DIAGNOSIS — I441 Atrioventricular block, second degree: Secondary | ICD-10-CM | POA: Diagnosis present

## 2018-06-18 DIAGNOSIS — E1122 Type 2 diabetes mellitus with diabetic chronic kidney disease: Secondary | ICD-10-CM | POA: Diagnosis present

## 2018-06-18 DIAGNOSIS — Z881 Allergy status to other antibiotic agents status: Secondary | ICD-10-CM

## 2018-06-18 DIAGNOSIS — E785 Hyperlipidemia, unspecified: Secondary | ICD-10-CM | POA: Diagnosis present

## 2018-06-18 DIAGNOSIS — Z8546 Personal history of malignant neoplasm of prostate: Secondary | ICD-10-CM

## 2018-06-18 DIAGNOSIS — Z7982 Long term (current) use of aspirin: Secondary | ICD-10-CM

## 2018-06-18 DIAGNOSIS — I5041 Acute combined systolic (congestive) and diastolic (congestive) heart failure: Secondary | ICD-10-CM

## 2018-06-18 DIAGNOSIS — N183 Chronic kidney disease, stage 3 (moderate): Secondary | ICD-10-CM | POA: Diagnosis present

## 2018-06-18 DIAGNOSIS — I130 Hypertensive heart and chronic kidney disease with heart failure and stage 1 through stage 4 chronic kidney disease, or unspecified chronic kidney disease: Secondary | ICD-10-CM | POA: Diagnosis present

## 2018-06-18 HISTORY — PX: RIGHT/LEFT HEART CATH AND CORONARY ANGIOGRAPHY: CATH118266

## 2018-06-18 LAB — POCT I-STAT EG7
Acid-Base Excess: 3 mmol/L — ABNORMAL HIGH (ref 0.0–2.0)
Acid-Base Excess: 3 mmol/L — ABNORMAL HIGH (ref 0.0–2.0)
Bicarbonate: 28.9 mmol/L — ABNORMAL HIGH (ref 20.0–28.0)
Bicarbonate: 28.4 mmol/L — ABNORMAL HIGH (ref 20.0–28.0)
Calcium, Ion: 1.25 mmol/L (ref 1.15–1.40)
Calcium, Ion: 1.25 mmol/L (ref 1.15–1.40)
HCT: 32 % — ABNORMAL LOW (ref 39.0–52.0)
HCT: 33 % — ABNORMAL LOW (ref 39.0–52.0)
Hemoglobin: 10.9 g/dL — ABNORMAL LOW (ref 13.0–17.0)
Hemoglobin: 11.2 g/dL — ABNORMAL LOW (ref 13.0–17.0)
O2 Saturation: 56 %
O2 Saturation: 61 %
Potassium: 3.4 mmol/L — ABNORMAL LOW (ref 3.5–5.1)
Potassium: 3.4 mmol/L — ABNORMAL LOW (ref 3.5–5.1)
Sodium: 142 mmol/L (ref 135–145)
Sodium: 142 mmol/L (ref 135–145)
TCO2: 30 mmol/L (ref 22–32)
TCO2: 30 mmol/L (ref 22–32)
pCO2, Ven: 46.5 mmHg (ref 44.0–60.0)
pCO2, Ven: 45.7 mmHg (ref 44.0–60.0)
pH, Ven: 7.401 (ref 7.250–7.430)
pH, Ven: 7.401 (ref 7.250–7.430)
pO2, Ven: 30 mmHg — CL (ref 32.0–45.0)
pO2, Ven: 32 mmHg (ref 32.0–45.0)

## 2018-06-18 LAB — POCT I-STAT 7, (LYTES, BLD GAS, ICA,H+H)
Acid-Base Excess: 2 mmol/L (ref 0.0–2.0)
Bicarbonate: 26.7 mmol/L (ref 20.0–28.0)
Calcium, Ion: 1.22 mmol/L (ref 1.15–1.40)
HCT: 31 % — ABNORMAL LOW (ref 39.0–52.0)
Hemoglobin: 10.5 g/dL — ABNORMAL LOW (ref 13.0–17.0)
O2 Saturation: 91 %
Potassium: 3.4 mmol/L — ABNORMAL LOW (ref 3.5–5.1)
Sodium: 138 mmol/L (ref 135–145)
TCO2: 28 mmol/L (ref 22–32)
pCO2 arterial: 41.8 mmHg (ref 32.0–48.0)
pH, Arterial: 7.413 (ref 7.350–7.450)
pO2, Arterial: 62 mmHg — ABNORMAL LOW (ref 83.0–108.0)

## 2018-06-18 LAB — BASIC METABOLIC PANEL
Anion gap: 11 (ref 5–15)
BUN: 17 mg/dL (ref 8–23)
CO2: 28 mmol/L (ref 22–32)
Calcium: 9.4 mg/dL (ref 8.9–10.3)
Chloride: 101 mmol/L (ref 98–111)
Creatinine, Ser: 1.52 mg/dL — ABNORMAL HIGH (ref 0.61–1.24)
GFR calc Af Amer: 49 mL/min — ABNORMAL LOW (ref >60)
GFR calc non Af Amer: 42 mL/min — ABNORMAL LOW (ref >60)
Glucose, Bld: 100 mg/dL — ABNORMAL HIGH (ref 70–99)
Potassium: 3.5 mmol/L (ref 3.5–5.1)
Sodium: 140 mmol/L (ref 135–145)

## 2018-06-18 LAB — GLUCOSE, CAPILLARY
Glucose-Capillary: 90 mg/dL (ref 70–99)
Glucose-Capillary: 96 mg/dL (ref 70–99)
Glucose-Capillary: 165 mg/dL — ABNORMAL HIGH (ref 70–99)
Glucose-Capillary: 127 mg/dL — ABNORMAL HIGH (ref 70–99)

## 2018-06-18 LAB — BRAIN NATRIURETIC PEPTIDE: B Natriuretic Peptide: 1470.4 pg/mL — ABNORMAL HIGH (ref 0.0–100.0)

## 2018-06-18 SURGERY — RIGHT/LEFT HEART CATH AND CORONARY ANGIOGRAPHY
Anesthesia: LOCAL

## 2018-06-18 MED ORDER — FUROSEMIDE 10 MG/ML IJ SOLN
INTRAMUSCULAR | Status: AC
  Filled 2018-06-18: qty 4

## 2018-06-18 MED ORDER — SODIUM CHLORIDE 0.9% FLUSH: 3.0000 mL | INTRAVENOUS | Status: DC | PRN

## 2018-06-18 MED ORDER — VERAPAMIL HCL 2.5 MG/ML IV SOLN
INTRAVENOUS | Status: DC | PRN
  Administered 2018-06-18: 10 mL via INTRA_ARTERIAL

## 2018-06-18 MED ORDER — MIDAZOLAM HCL 2 MG/2ML IJ SOLN
INTRAMUSCULAR | Status: DC | PRN
  Administered 2018-06-18: 1 mg via INTRAVENOUS

## 2018-06-18 MED ORDER — DIAZEPAM 5 MG PO TABS: 5.0000 mg | ORAL_TABLET | ORAL | Status: DC | PRN

## 2018-06-18 MED ORDER — ACETAMINOPHEN 325 MG PO TABS: 650.0000 mg | ORAL_TABLET | ORAL | Status: DC | PRN

## 2018-06-18 MED ORDER — HYDRALAZINE HCL 20 MG/ML IJ SOLN
INTRAMUSCULAR | Status: AC
  Filled 2018-06-18: qty 1

## 2018-06-18 MED ORDER — HEPARIN SODIUM (PORCINE) 1000 UNIT/ML IJ SOLN
INTRAMUSCULAR | Status: DC | PRN
  Administered 2018-06-18: 5000 [IU] via INTRAVENOUS

## 2018-06-18 MED ORDER — POTASSIUM CHLORIDE CRYS ER 20 MEQ PO TBCR
40.0000 meq | EXTENDED_RELEASE_TABLET | Freq: Once | ORAL | Status: AC
  Administered 2018-06-18: 40 meq via ORAL

## 2018-06-18 MED ORDER — IOHEXOL 350 MG/ML SOLN
INTRAVENOUS | Status: DC | PRN
  Administered 2018-06-18: 92 mL via INTRA_ARTERIAL

## 2018-06-18 MED ORDER — ASPIRIN 81 MG PO CHEW
81.0000 mg | CHEWABLE_TABLET | Freq: Every day | ORAL | Status: DC
  Administered 2018-06-18 – 2018-06-20 (×3): 81 mg via ORAL
  Filled 2018-06-18 (×2): qty 1

## 2018-06-18 MED ORDER — MORPHINE SULFATE (PF) 2 MG/ML IV SOLN
INTRAVENOUS | Status: AC
  Filled 2018-06-18: qty 1

## 2018-06-18 MED ORDER — HEPARIN (PORCINE) IN NACL 1000-0.9 UT/500ML-% IV SOLN
INTRAVENOUS | Status: AC
  Filled 2018-06-18: qty 1000

## 2018-06-18 MED ORDER — FUROSEMIDE 10 MG/ML IJ SOLN
40.0000 mg | Freq: Once | INTRAMUSCULAR | Status: AC
  Administered 2018-06-18: 40 mg via INTRAVENOUS

## 2018-06-18 MED ORDER — ASPIRIN 81 MG PO CHEW: 81.0000 mg | CHEWABLE_TABLET | ORAL | Status: DC

## 2018-06-18 MED ORDER — MIDAZOLAM HCL 2 MG/2ML IJ SOLN
INTRAMUSCULAR | Status: AC
  Filled 2018-06-18: qty 2

## 2018-06-18 MED ORDER — FUROSEMIDE 10 MG/ML IJ SOLN
INTRAMUSCULAR | Status: DC | PRN
  Administered 2018-06-18 (×2): 20 mg via INTRAVENOUS

## 2018-06-18 MED ORDER — HEPARIN SODIUM (PORCINE) 1000 UNIT/ML IJ SOLN
INTRAMUSCULAR | Status: AC
  Filled 2018-06-18: qty 1

## 2018-06-18 MED ORDER — SODIUM CHLORIDE 0.9 % IV SOLN: 250.0000 mL | INTRAVENOUS | Status: DC | PRN

## 2018-06-18 MED ORDER — MORPHINE SULFATE (PF) 2 MG/ML IV SOLN
2.0000 mg | Freq: Once | INTRAVENOUS | Status: AC
  Administered 2018-06-18: 2 mg via INTRAVENOUS

## 2018-06-18 MED ORDER — FENTANYL CITRATE (PF) 100 MCG/2ML IJ SOLN
INTRAMUSCULAR | Status: AC
  Filled 2018-06-18: qty 2

## 2018-06-18 MED ORDER — ALBUTEROL SULFATE (2.5 MG/3ML) 0.083% IN NEBU
2.5000 mg | INHALATION_SOLUTION | Freq: Four times a day (QID) | RESPIRATORY_TRACT | Status: DC | PRN
  Administered 2018-06-18 (×2): 2.5 mg via RESPIRATORY_TRACT
  Filled 2018-06-18: qty 3

## 2018-06-18 MED ORDER — HEPARIN (PORCINE) IN NACL 1000-0.9 UT/500ML-% IV SOLN
INTRAVENOUS | Status: DC | PRN
  Administered 2018-06-18 (×2): 500 mL

## 2018-06-18 MED ORDER — LIDOCAINE HCL (PF) 1 % IJ SOLN
INTRAMUSCULAR | Status: AC
  Filled 2018-06-18: qty 30

## 2018-06-18 MED ORDER — FENTANYL CITRATE (PF) 100 MCG/2ML IJ SOLN
INTRAMUSCULAR | Status: DC | PRN
  Administered 2018-06-18: 25 ug via INTRAVENOUS

## 2018-06-18 MED ORDER — ATORVASTATIN CALCIUM 40 MG PO TABS
40.0000 mg | ORAL_TABLET | Freq: Every day | ORAL | Status: DC
  Administered 2018-06-18 – 2018-06-19 (×2): 40 mg via ORAL
  Filled 2018-06-18 (×2): qty 1

## 2018-06-18 MED ORDER — HYDRALAZINE HCL 20 MG/ML IJ SOLN
10.0000 mg | Freq: Four times a day (QID) | INTRAMUSCULAR | Status: DC | PRN
  Administered 2018-06-18: 10 mg via INTRAVENOUS

## 2018-06-18 MED ORDER — SODIUM CHLORIDE 0.9% FLUSH
3.0000 mL | Freq: Two times a day (BID) | INTRAVENOUS | Status: DC
  Administered 2018-06-19 – 2018-06-20 (×3): 3 mL via INTRAVENOUS

## 2018-06-18 MED ORDER — SODIUM CHLORIDE 0.9% FLUSH: 3.0000 mL | Freq: Two times a day (BID) | INTRAVENOUS | Status: DC

## 2018-06-18 MED ORDER — VERAPAMIL HCL 2.5 MG/ML IV SOLN
INTRAVENOUS | Status: AC
  Filled 2018-06-18: qty 2

## 2018-06-18 MED ORDER — POTASSIUM CHLORIDE CRYS ER 20 MEQ PO TBCR
EXTENDED_RELEASE_TABLET | ORAL | Status: AC
  Filled 2018-06-18: qty 2

## 2018-06-18 MED ORDER — LIDOCAINE HCL (PF) 1 % IJ SOLN
INTRAMUSCULAR | Status: DC | PRN
  Administered 2018-06-18 (×2): 2 mL via INTRADERMAL

## 2018-06-18 MED ORDER — ONDANSETRON HCL 4 MG/2ML IJ SOLN: 4.0000 mg | Freq: Four times a day (QID) | INTRAMUSCULAR | Status: DC | PRN

## 2018-06-18 MED ORDER — IPRATROPIUM-ALBUTEROL 0.5-2.5 (3) MG/3ML IN SOLN
3.0000 mL | Freq: Once | RESPIRATORY_TRACT | Status: DC
  Filled 2018-06-18: qty 3

## 2018-06-18 MED ORDER — ALBUTEROL SULFATE (2.5 MG/3ML) 0.083% IN NEBU
INHALATION_SOLUTION | RESPIRATORY_TRACT | Status: AC
  Filled 2018-06-18: qty 3

## 2018-06-18 MED ORDER — FUROSEMIDE 10 MG/ML IJ SOLN: 40.0000 mg | Freq: Once | INTRAMUSCULAR | Status: DC

## 2018-06-18 MED ORDER — HYDRALAZINE HCL 20 MG/ML IJ SOLN
10.0000 mg | Freq: Once | INTRAMUSCULAR | Status: AC
  Administered 2018-06-18: 10 mg via INTRAVENOUS

## 2018-06-18 MED ORDER — SODIUM CHLORIDE 0.9 % IV SOLN: INTRAVENOUS | Status: AC

## 2018-06-18 MED ORDER — SODIUM CHLORIDE 0.9 % IV SOLN
INTRAVENOUS | Status: DC
  Administered 2018-06-18: 06:00:00 via INTRAVENOUS

## 2018-06-18 MED ORDER — ATORVASTATIN CALCIUM 40 MG PO TABS: 40.0000 mg | ORAL_TABLET | Freq: Every day | ORAL | Status: DC

## 2018-06-18 SURGICAL SUPPLY — 12 items
CATH BALLN WEDGE 5F 110CM (CATHETERS) ×2 IMPLANT
CATH INFINITI 5 FR JL3.5 (CATHETERS) ×1 IMPLANT
CATH INFINITI 5FR JL4 (CATHETERS) ×1 IMPLANT
CATH OPTITORQUE TIG 4.0 5F (CATHETERS) ×1 IMPLANT
GLIDESHEATH SLEND SS 6F .021 (SHEATH) ×2 IMPLANT
GUIDEWIRE INQWIRE 1.5J.035X260 (WIRE) ×1 IMPLANT
INQWIRE 1.5J .035X260CM (WIRE) ×2
KIT HEART LEFT (KITS) ×2 IMPLANT
PACK CARDIAC CATHETERIZATION (CUSTOM PROCEDURE TRAY) ×2 IMPLANT
SHEATH GLIDE SLENDER 4/5FR (SHEATH) ×2 IMPLANT
TRANSDUCER W/STOPCOCK (MISCELLANEOUS) ×2 IMPLANT
TUBING CIL FLEX 10 FLL-RA (TUBING) ×2 IMPLANT

## 2018-06-18 NOTE — Progress Notes (Addendum)
Patient arrived to 4E room 04. Telemetry applied and CCMD notified. V/s and assessment complete. CHG done. Patient oriented to room and how to call nurse with any needs.

## 2018-06-18 NOTE — Interval H&P Note (Signed)
Cath Lab Visit (complete for each Cath Lab visit)  Clinical Evaluation Leading to the Procedure:   ACS: No.  Non-ACS:    Anginal Classification: CCS III  Anti-ischemic medical therapy: Minimal Therapy (1 class of medications)  Non-Invasive Test Results: No non-invasive testing performed  Prior CABG: No previous CABG      History and Physical Interval Note:  06/18/2018 7:39 AM  Allen Dyer  has presented today for surgery, with the diagnosis of chf  The various methods of treatment have been discussed with the patient and family. After consideration of risks, benefits and other options for treatment, the patient has consented to  Procedure(s): RIGHT/LEFT HEART CATH AND CORONARY ANGIOGRAPHY (N/A) as a surgical intervention .  The patient's history has been reviewed, patient examined, no change in status, stable for surgery.  I have reviewed the patient's chart and labs.  Questions were answered to the patient's satisfaction.     Shelva Majestic

## 2018-06-18 NOTE — Progress Notes (Signed)
In assiting with the care of Allen Dyer, 2mg  morphine IV was ordered by Dr. Claiborne Billings.  There was a contraindication for an allergic reaction.  I contacted the pharmacy and spoke with the pharmacist Sharyn Lull who reviewed his allergic reaction and approved administration of the morphine.

## 2018-06-18 NOTE — Progress Notes (Signed)
Pt is very short of breath. On 3 liters o2. Sats 89%.Non rebreather applied and Dr Blair Hailey. Condom catheter applied. Expiritory wheezing. Sats now 98%. Lasix given. Pt is more comfortable breathing and not gasping for breath.

## 2018-06-18 NOTE — Progress Notes (Signed)
Lindsay-NP in to see Allen Dyer.  Pt feels he breaths better on non rebreather than nasal canula. Sats are 100%. Pt is comfortable and sleeping. Responds to voice.  Rt radial band is off and replaced with a tegaderm and a 2x2. Daughter is at bedside. Waiting for a bed assignment.

## 2018-06-18 NOTE — Progress Notes (Signed)
Lindsay-NP id her to evaluate pt. Mr. Wiltsey breathing is more comfortable and daughter is at bedside. Pt still on non re-breather. Pt will be admitted.

## 2018-06-18 NOTE — Progress Notes (Addendum)
    Patient presented for cardiac cath today. Had been short of breath for several days. Held his lasix for 2 days prior to cath. Underwent cardiac cath with Dr. Claiborne Billings and developed onset of respiratory distress with pulmonary edema. Given 2 doses of 40mg  IV lasix, and placed on NRB. UOP +1L thus far. Improved following lasix.   General: Improved, wearing NRB.  Head: Normocephalic, atraumatic.  Neck: Supple without bruits, + JVD. Lungs:  Resp regular and slighted labored, decreased in the bases, expiratory wheezing. Heart: RRR, S1, S2, no S3, S4, or murmur; no rub. Abdomen: Soft, non-tender, non-distended with normoactive bowel sounds. No hepatomegaly. No rebound/guarding. No obvious abdominal masses. Extremities: No clubbing, cyanosis, 2+ LE edema. Distal pedal pulses are 2+ bilaterally.  Neuro: Alert and oriented X 3. Moves all extremities spontaneously. Psych: Normal affect.  Plan:  - admit to inpatient, progressive bed - order home medications - follow response to lasix, many need additional dose this evening - Potassium 72meq x1 now - wean NRB as tolerated - BMET in the am  Signed, Reino Bellis, NP-C 06/18/2018, 10:46 AM Pager: [phone]  Patient seen and examined. Agree with assessment and plan.  Please refer to the cardiac catheterization note.  The patient is feeling much better.  He has diuresed over 1 L of fluid since his catheterization after receiving Lasix 40 mg x 2.  Potassium was administered with K at 3.5.  I have spoken with Dr. Radford Pax.  We will keep the patient overnight and additional therapy with supplemental oxygen.  F/U laboratory in a.m. and will also check a BNP level at that time.  Troy Sine, MD, [facility] 06/18/2018 10:57 AM

## 2018-06-19 ENCOUNTER — Encounter (HOSPITAL_COMMUNITY): Payer: Self-pay | Admitting: Cardiovascular Disease

## 2018-06-19 LAB — BASIC METABOLIC PANEL
Anion gap: 10 (ref 5–15)
BUN: 16 mg/dL (ref 8–23)
CO2: 28 mmol/L (ref 22–32)
Calcium: 9.3 mg/dL (ref 8.9–10.3)
Chloride: 102 mmol/L (ref 98–111)
Creatinine, Ser: 1.56 mg/dL — ABNORMAL HIGH (ref 0.61–1.24)
GFR calc Af Amer: 48 mL/min — ABNORMAL LOW (ref >60)
GFR calc non Af Amer: 41 mL/min — ABNORMAL LOW (ref >60)
Glucose, Bld: 114 mg/dL — ABNORMAL HIGH (ref 70–99)
Potassium: 3.5 mmol/L (ref 3.5–5.1)
Sodium: 140 mmol/L (ref 135–145)

## 2018-06-19 LAB — CBC
HCT: 36 % — ABNORMAL LOW (ref 39.0–52.0)
Hemoglobin: 11.4 g/dL — ABNORMAL LOW (ref 13.0–17.0)
MCH: 30.2 pg (ref 26.0–34.0)
MCHC: 31.7 g/dL (ref 30.0–36.0)
MCV: 95.5 fL (ref 80.0–100.0)
Platelets: 213 10*3/uL (ref 150–400)
RBC: 3.77 MIL/uL — ABNORMAL LOW (ref 4.22–5.81)
RDW: 16 % — ABNORMAL HIGH (ref 11.5–15.5)
WBC: 7.5 10*3/uL (ref 4.0–10.5)
nRBC: 0 % (ref 0.0–0.2)

## 2018-06-19 LAB — GLUCOSE, CAPILLARY
Glucose-Capillary: 106 mg/dL — ABNORMAL HIGH (ref 70–99)
Glucose-Capillary: 194 mg/dL — ABNORMAL HIGH (ref 70–99)
Glucose-Capillary: 122 mg/dL — ABNORMAL HIGH (ref 70–99)
Glucose-Capillary: 176 mg/dL — ABNORMAL HIGH (ref 70–99)

## 2018-06-19 MED ORDER — FUROSEMIDE 10 MG/ML IJ SOLN
40.0000 mg | Freq: Once | INTRAMUSCULAR | Status: AC
  Administered 2018-06-19: 40 mg via INTRAVENOUS
  Filled 2018-06-19: qty 4

## 2018-06-19 MED ORDER — HYDRALAZINE HCL 10 MG PO TABS
10.0000 mg | ORAL_TABLET | Freq: Three times a day (TID) | ORAL | Status: DC
  Administered 2018-06-19 – 2018-06-20 (×4): 10 mg via ORAL
  Filled 2018-06-19 (×4): qty 1

## 2018-06-19 MED ORDER — ISOSORBIDE MONONITRATE ER 30 MG PO TB24
15.0000 mg | ORAL_TABLET | Freq: Every day | ORAL | Status: DC
  Administered 2018-06-19 – 2018-06-20 (×2): 15 mg via ORAL
  Filled 2018-06-19 (×2): qty 1

## 2018-06-19 MED ORDER — POTASSIUM CHLORIDE CRYS ER 20 MEQ PO TBCR
60.0000 meq | EXTENDED_RELEASE_TABLET | Freq: Once | ORAL | Status: AC
  Administered 2018-06-19: 60 meq via ORAL
  Filled 2018-06-19: qty 3

## 2018-06-19 MED ORDER — CARVEDILOL 6.25 MG PO TABS
6.2500 mg | ORAL_TABLET | Freq: Two times a day (BID) | ORAL | Status: DC
  Administered 2018-06-19 – 2018-06-20 (×3): 6.25 mg via ORAL
  Filled 2018-06-19 (×3): qty 1

## 2018-06-19 MED ORDER — INSULIN ASPART 100 UNIT/ML ~~LOC~~ SOLN
0.0000 [IU] | Freq: Three times a day (TID) | SUBCUTANEOUS | Status: DC
  Administered 2018-06-19: 3 [IU] via SUBCUTANEOUS
  Administered 2018-06-19: 2 [IU] via SUBCUTANEOUS
  Administered 2018-06-20: 3 [IU] via SUBCUTANEOUS

## 2018-06-19 NOTE — Progress Notes (Addendum)
Notified by CCMD taht patient had a 5 beat run of VTach. Patient standing at bedside with NT after bowel movement. Patient asymptomatic. Will continue to monitor. Silvana Newness, NP notified via [place] system.  Emelda Fear, RN

## 2018-06-19 NOTE — Addendum Note (Signed)
Addended by: Patterson Hammersmith A on: 06/19/2018 01:27 PM   Modules accepted: Orders

## 2018-06-19 NOTE — Progress Notes (Addendum)
SATURATION QUALIFICATIONS: (This note is used to comply with regulatory documentation for home oxygen)  Patient Saturations on Room Air at Rest = 95-99%  Patient Saturations on Room Air while Ambulating = 90-95%   K. Clement Husbands, RN

## 2018-06-19 NOTE — Progress Notes (Addendum)
Progress Note  Patient Name: Allen Dyer Date of Encounter: 06/19/2018  Primary Cardiologist: No primary care provider on file.   Subjective   Feeling much better this morning. Now on [place] at 2L.   Inpatient Medications    Scheduled Meds: . aspirin  81 mg Oral Daily  . atorvastatin  40 mg Oral q1800  . furosemide  40 mg Intravenous Once  . ipratropium-albuterol  3 mL Nebulization Once  . potassium chloride  60 mEq Oral Once  . sodium chloride flush  3 mL Intravenous Q12H   Continuous Infusions: . sodium chloride     PRN Meds: sodium chloride, acetaminophen, albuterol, diazepam, hydrALAZINE, ondansetron (ZOFRAN) IV, sodium chloride flush   Vital Signs    Vitals:   06/18/18 1921 06/18/18 2000 06/18/18 2349 06/19/18 0459  BP: (!) 154/96  (!) 149/94 140/75  Pulse: 72 76 87 66  Resp: 17 17 20 13   Temp: 98.6 F (37 C)  98.9 F (37.2 C) 98 F (36.7 C)  TempSrc: Oral  Oral Oral  SpO2: 99% 98% 96% 97%  Weight:      Height:        Intake/Output Summary (Last 24 hours) at 06/19/2018 0844 Last data filed at 06/19/2018 0505 Gross per 24 hour  Intake 120 ml  Output 4750 ml  Net -4630 ml   Last 3 Weights 06/18/2018 06/14/2018  Weight (lbs) 221 lb 221 lb 12.8 oz  Weight (kg) 100.245 kg 100.608 kg      Telemetry    SR with PVCs - Personally Reviewed  ECG    N/a - Personally Reviewed  Physical Exam   GEN: No acute distress, wearing [place] Neck: + JVD Cardiac: RRR, no murmurs, rubs, or gallops.  Respiratory: Clear to auscultation bilaterally. GI: Soft, nontender, non-distended  MS: 1+ LE on the right, trace to the left, No deformity.  Neuro:  Nonfocal  Psych: Normal affect   Labs    Chemistry Recent Labs  Lab 06/14/18 1222  06/18/18 0620 06/18/18 0801 06/18/18 0813 06/19/18 0305  NA 148*   < > 140 142  142 138 140  K 3.9  --  3.5 3.4*  3.4* 3.4* 3.5  CL 103  --  101  --   --  102  CO2 27  --  28  --   --  28  GLUCOSE 126*  --  100*  --   --  114*    BUN 20  --  17  --   --  16  CREATININE 1.60*  --  1.52*  --   --  1.56*  CALCIUM 10.1  --  9.4  --   --  9.3  GFRNONAA 40*  --  42*  --   --  41*  GFRAA 46*  --  49*  --   --  48*  ANIONGAP  --   --  11  --   --  10   < > = values in this interval not displayed.     Hematology Recent Labs  Lab 06/14/18 1222 06/18/18 0801 06/18/18 0813 06/19/18 0305  WBC 5.5  --   --  7.5  RBC 3.67*  --   --  3.77*  HGB 11.0* 11.2*  10.9* 10.5* 11.4*  HCT 33.9* 33.0*  32.0* 31.0* 36.0*  MCV 92  --   --  95.5  MCH 30.0  --   --  30.2  MCHC 32.4  --   --  31.7  RDW 14.7  --   --  16.0*  PLT 214  --   --  213    Cardiac EnzymesNo results for input(s): TROPONINI in the last 168 hours. No results for input(s): TROPIPOC in the last 168 hours.   BNP Recent Labs  Lab 06/14/18 1222 06/18/18 1625  BNP  --  1,470.4*  PROBNP 2,678*  --      DDimer No results for input(s): DDIMER in the last 168 hours.   Radiology    No results found.  Cardiac Studies   Cath: 06/18/2018   Prox RCA lesion is 30% stenosed.  Dist RCA-1 lesion is 20% stenosed.  Dist RCA-2 lesion is 20% stenosed.  Prox Cx lesion is 10% stenosed.  Dist LM to Ost LAD lesion is 20% stenosed.  Prox LAD lesion is 50% stenosed.  Prox LAD to Mid LAD lesion is 40% stenosed.   Significant right heart pressure elevation with severe pulmonary hypertension with a PA pressure of 72/27, mean 47.  Mild to moderate nonobstructive disease with mild proximal calcification of the LAD with 20% narrowing, 50% smooth narrowing after the diagonal vessel with 40% mid stenosis and a large wraparound LAD; mild 10% narrowing in the proximal circumflex; and mild irregularity of the RCA with 30% proximal stenosis and segmental 20% distal stenoses.  Findings consistent with a nonischemic cardiomyopathy.  RECOMMENDATION: The patient was short of breath at the completion of the procedure and received Lasix 40 mg at the completion of the  procedure with prompt diuresis of over 450 cc prior to leaving the laboratory.  Will initially be monitored in the holding area to assess oxygenation and additional diuresis.  The patient had held his Lasix for 2 days prior to the procedure which undoubtedly contributed to his significant right heart pressure elevation.  Guideline directed medical therapy for his cardiomyopathy will be necessary.  Medical therapy initially for his CAD.  Initiation of lipid-lowering therapy.  Optimal blood pressure control with ideal blood pressure less than 120/80.  Patient will follow-up with Dr. Golden Hurter.  Patient Profile     83 y.o. male with PMH of DM, Rheumatic fever, HTN, HL, CKD stage III, prostate CA who was referred for outpatient cath and developed pulmonary edema in the setting of holding lasix prior to cath.   Assessment & Plan    1. Acute CHF: Received 40mg  IV lasix x2 yesterday. >4.5 L UOP. Feeling much better this morning. Still on [place], and some LE edema noted.  -- re-dose 40mg  IV lasix x1 now -- ambulate in the hallway -- will likely need to adjust home lasix dose prior to discharge -- restart BB, baseline CKD therefore no ACE (cough in the past)/ARB  2. HTN: was very hypertensive yesterday. Will restart home medications today  3. CKD stage III: Cr stable at 1.5 today  4. DM: Home medication held. Add SSI while admitted.   For questions or updates, please contact [place] Please consult [email or web address] for contact info under     Signed, Reino Bellis, NP  06/19/2018, 8:44 AM    I have examined the patient and reviewed assessment and plan and discussed with patient.  Agree with above as stated.  Additional IV Lasix today.  Switch to PO Lasix in AM.  CHF sx much better.  POssible discharge in AM.  All questions of patient and family were answered.   Larae Grooms

## 2018-06-20 ENCOUNTER — Other Ambulatory Visit: Payer: Self-pay | Admitting: Cardiology

## 2018-06-20 DIAGNOSIS — Z79899 Other long term (current) drug therapy: Secondary | ICD-10-CM

## 2018-06-20 LAB — BASIC METABOLIC PANEL
Anion gap: 8 (ref 5–15)
BUN: 22 mg/dL (ref 8–23)
CO2: 30 mmol/L (ref 22–32)
Calcium: 9.2 mg/dL (ref 8.9–10.3)
Chloride: 101 mmol/L (ref 98–111)
Creatinine, Ser: 1.79 mg/dL — ABNORMAL HIGH (ref 0.61–1.24)
GFR calc Af Amer: 40 mL/min — ABNORMAL LOW (ref >60)
GFR calc non Af Amer: 35 mL/min — ABNORMAL LOW (ref >60)
Glucose, Bld: 123 mg/dL — ABNORMAL HIGH (ref 70–99)
Potassium: 3.9 mmol/L (ref 3.5–5.1)
Sodium: 139 mmol/L (ref 135–145)

## 2018-06-20 LAB — GLUCOSE, CAPILLARY
Glucose-Capillary: 104 mg/dL — ABNORMAL HIGH (ref 70–99)
Glucose-Capillary: 163 mg/dL — ABNORMAL HIGH (ref 70–99)

## 2018-06-20 MED ORDER — ENOXAPARIN SODIUM 40 MG/0.4ML ~~LOC~~ SOLN: 40.0000 mg | SUBCUTANEOUS | Status: DC

## 2018-06-20 MED ORDER — POTASSIUM CHLORIDE CRYS ER 20 MEQ PO TBCR
40.0000 meq | EXTENDED_RELEASE_TABLET | Freq: Once | ORAL | Status: AC
  Administered 2018-06-20: 40 meq via ORAL
  Filled 2018-06-20: qty 2

## 2018-06-20 MED ORDER — FUROSEMIDE 40 MG PO TABS
40.0000 mg | ORAL_TABLET | Freq: Every day | ORAL | Status: DC
  Administered 2018-06-20: 40 mg via ORAL
  Filled 2018-06-20: qty 1

## 2018-06-20 MED ORDER — ATORVASTATIN CALCIUM 40 MG PO TABS: 40.0000 mg | ORAL_TABLET | Freq: Every day | ORAL | 2 refills | Status: DC

## 2018-06-20 NOTE — Care Management Note (Signed)
Case Management Note Marvetta Gibbons RN, BSN Transitions of Care Unit 4E- RN Case Manager [phone]  Patient Details  Name: Allen Dyer MRN: [id number] Date of Birth: Dec 17, 1935  Subjective/Objective:  Pt admitted with nonischemic cardiomyopathy, s/p left/right heart cath                  Action/Plan: PTA pt lived at home with family, is followed by the [facility] in [place], also has local PCP- Koirala, Dibas, MD. no CM needs noted for transition home.   Expected Discharge Date:  06/20/18               Expected Discharge Plan:  Home/Self Care  In-House Referral:  NA  Discharge planning Services  CM Consult  Post Acute Care Choice:  NA Choice offered to:  NA  DME Arranged:    DME Agency:     HH Arranged:    HH Agency:     Status of Service:  Completed, signed off  If discussed at [place] of Stay Meetings, dates discussed:    Discharge Disposition: home/self care   Additional Comments:  Dawayne Patricia, RN 06/20/2018, 11:11 AM

## 2018-06-20 NOTE — Plan of Care (Signed)
Care plan has been reviewed:  Problem: Clinical Measurements: acute congestive heart failure, pulmonary edema, with shortness of breath after cardiac cath. Goal: Cardiovascular complication will be avoided Outcome: Progressing: hemodynamic has been stable tonight, sinus rhythm on monitor, HR 55-75, no chest pain.    Problem: Clinical Measurements: SPO2 88-93% on room air. Goal: Respiratory complications will improve Outcome: Progressing: O2 NCL 2 LPM given tonight for adequate O2 supply, then SPO2 remained 98-100% , no signs of any distress seen. Will continue to monitor.  Calyse Murcia,BSN,RN,PCCN-CMC

## 2018-06-20 NOTE — Discharge Summary (Addendum)
Discharge Summary    Patient ID: Allen Dyer,  MRN: [id number], DOB/AGE: [birth year] 83 y.o.  Admit date: 06/18/2018 Discharge date: 06/20/2018  Primary Care Provider: Dorthy Cooler, Dibas Primary Cardiologist: Dr. Radford Pax   Discharge Diagnoses    Active Problems:   NICM (nonischemic cardiomyopathy) ([place])   Pulmonary hypertension ([place])   Acute CHF (congestive heart failure) ([facility])   Allergies Allergies  Allergen Reactions  . Tramadol Other (See Comments)    Dizziness (intolerance)   . Finasteride Swelling    Breast enlargement   . Lisinopril Cough  . Ciprofloxacin Other (See Comments)    Dizziness (intolerance)   . Simvastatin Hives and Other (See Comments)    Blisters/ Rash   . Sulfa Antibiotics Rash    Diagnostic Studies/Procedures    Cath: 06/18/2018   Prox RCA lesion is 30% stenosed.  Dist RCA-1 lesion is 20% stenosed.  Dist RCA-2 lesion is 20% stenosed.  Prox Cx lesion is 10% stenosed.  Dist LM to Ost LAD lesion is 20% stenosed.  Prox LAD lesion is 50% stenosed.  Prox LAD to Mid LAD lesion is 40% stenosed.   Significant right heart pressure elevation with severe pulmonary hypertension with a PA pressure of 72/27, mean 47.  Mild to moderate nonobstructive disease with mild proximal calcification of the LAD with 20% narrowing, 50% smooth narrowing after the diagonal vessel with 40% mid stenosis and a large wraparound LAD; mild 10% narrowing in the proximal circumflex; and mild irregularity of the RCA with 30% proximal stenosis and segmental 20% distal stenoses.  Findings consistent with a nonischemic cardiomyopathy.  RECOMMENDATION: The patient was short of breath at the completion of the procedure and received Lasix 40 mg at the completion of the procedure with prompt diuresis of over 450 cc prior to leaving the laboratory.  Will initially be monitored in the holding area to assess oxygenation and additional diuresis.  The patient had held his Lasix  for 2 days prior to the procedure which undoubtedly contributed to his significant right heart pressure elevation.  Guideline directed medical therapy for his cardiomyopathy will be necessary.  Medical therapy initially for his CAD.  Initiation of lipid-lowering therapy.  Optimal blood pressure control with ideal blood pressure less than 120/80.  Patient will follow-up with Dr. Golden Hurter. _____________   History of Present Illness     83 y.o. male who was seen for the evaluation of CHF at the request of Koirala, Dibas, MD.  83 year old male with a history of type 2 diabetes mellitus, rheumatic fever, hypertension, peripheral neuropathy with edema, hyperlipidemia, chronic CKD stage III, prostate CA status post XRT.  He was seen by PCP due to shortness of breath and lower extremity edema.  BNP was elevated at 2798 and found on chest x-ray 05/20/2018 to have pulmonary interstitial edema with small bilateral posterior pleural effusions.  He was diagnosed with acute CHF.  Started on Lasix with no significant improvement by PCP and on last office visit in January his Lasix was increased to 40 mg twice daily.  2D echo was done in our office 05/29/2018 showing moderate to severe LV dysfunction with EF 30 to 35% with moderately dilated LV and grade 2 diastolic dysfunction.  There was moderate pulmonary hypertension with PASP 59 mmHg.  Left atrium was moderately dilated and mild AI was seen.  Given his ongoing symptoms, he was set up for outpatient cardiac cath.   Hospital Course    He presented for cath. Had held his lasix  2 days prior to cath. Had developed worsening shortness of breath prior. Was able to undergo cath but shortly afterwards developed pulmonary edema. Given IV lasix and required a NRB. He was diuresed a total of 5L. Cr 1.5>>1.7, but baseline around 1.6. Resumed his home medications including BB and hydralazine. Unable to add ARB/spiro with renal function. He was able to wean from O2. Ambulated  in the hallway. Transitioned back to 40mg  oral lasix. Plan for follow up BMET.  Allen Dyer was seen by Dr. Irish Lack and determined stable for discharge home. Follow up in the office has been arranged. Medications are listed below.   _____________  Discharge Vitals Blood pressure 139/79, pulse 87, temperature 98.3 F (36.8 C), temperature source Oral, resp. rate 17, height 5\' 8"  (1.727 m), weight 100.2 kg, SpO2 91 %.  Filed Weights   06/18/18 0544  Weight: 100.2 kg    Labs & Radiologic Studies    CBC Recent Labs    06/18/18 0813 06/19/18 0305  WBC  --  7.5  HGB 10.5* 11.4*  HCT 31.0* 36.0*  MCV  --  95.5  PLT  --  169   Basic Metabolic Panel Recent Labs    06/19/18 0305 06/20/18 0149  NA 140 139  K 3.5 3.9  CL 102 101  CO2 28 30  GLUCOSE 114* 123*  BUN 16 22  CREATININE 1.56* 1.79*  CALCIUM 9.3 9.2   Liver Function Tests No results for input(s): AST, ALT, ALKPHOS, BILITOT, PROT, ALBUMIN in the last 72 hours. No results for input(s): LIPASE, AMYLASE in the last 72 hours. Cardiac Enzymes No results for input(s): CKTOTAL, CKMB, CKMBINDEX, TROPONINI in the last 72 hours. BNP Invalid input(s): POCBNP D-Dimer No results for input(s): DDIMER in the last 72 hours. Hemoglobin A1C No results for input(s): HGBA1C in the last 72 hours. Fasting Lipid Panel No results for input(s): CHOL, HDL, LDLCALC, TRIG, CHOLHDL, LDLDIRECT in the last 72 hours. Thyroid Function Tests No results for input(s): TSH, T4TOTAL, T3FREE, THYROIDAB in the last 72 hours.  Invalid input(s): FREET3 _____________  No results found. Disposition   Pt is being discharged home today in good condition.  Follow-up Plans & Appointments    Follow-up Information    Charlie Pitter, PA-C Follow up on 07/01/2018.   Specialties:  Cardiology, Radiology Why:  at 11:30am for your follow up appt.  Contact information: [address] [place] [address] [phone]         [facility] Follow up on 06/24/2018.   Why:  please come in for follow up labs.  Contact information: [place] [address] [phone]         Discharge Instructions    Diet - low sodium heart healthy   Complete by:  As directed    Discharge instructions   Complete by:  As directed    Radial Site Care Refer to this sheet in the next few weeks. These instructions provide you with information on caring for yourself after your procedure. Your caregiver may also give you more specific instructions. Your treatment has been planned according to current medical practices, but problems sometimes occur. Call your caregiver if you have any problems or questions after your procedure. HOME CARE INSTRUCTIONS You may shower the day after the procedure.Remove the bandage (dressing) and gently wash the site with plain soap and water.Gently pat the site dry.  Do not apply powder or lotion to  the site.  Do not submerge the affected site in water for 3 to 5 days.  Inspect the site at least twice daily.  Do not flex or bend the affected arm for 24 hours.  No lifting over 5 pounds (2.3 kg) for 5 days after your procedure.  Do not drive home if you are discharged the same day of the procedure. Have someone else drive you.  You may drive 24 hours after the procedure unless otherwise instructed by your caregiver.  What to expect: Any bruising will usually fade within 1 to 2 weeks.  Blood that collects in the tissue (hematoma) may be painful to the touch. It should usually decrease in size and tenderness within 1 to 2 weeks.  SEEK IMMEDIATE MEDICAL CARE IF: You have unusual pain at the radial site.  You have redness, warmth, swelling, or pain at the radial site.  You have drainage (other than a small amount of blood on the dressing).  You have chills.  You have a fever or persistent symptoms for more than 72 hours.    You have a fever and your symptoms suddenly get worse.  Your arm becomes pale, cool, tingly, or numb.  You have heavy bleeding from the site. Hold pressure on the site.   Increase activity slowly   Complete by:  As directed        Discharge Medications     Medication List    TAKE these medications   aspirin EC 81 MG tablet Take 81 mg by mouth every evening.   atorvastatin 40 MG tablet Commonly known as:  LIPITOR Take 1 tablet (40 mg total) by mouth daily at 6 PM.   B-12 PO Take 1 tablet by mouth daily.   carvedilol 6.25 MG tablet Commonly known as:  COREG Take 1 tablet (6.25 mg total) by mouth 2 (two) times daily.   furosemide 40 MG tablet Commonly known as:  LASIX Take 40 mg by mouth daily.   gabapentin 800 MG tablet Commonly known as:  NEURONTIN Take 800 mg by mouth 2 (two) times daily.   glipiZIDE 10 MG tablet Commonly known as:  GLUCOTROL Take 10 mg by mouth 2 (two) times daily.   hydrALAZINE 10 MG tablet Commonly known as:  APRESOLINE Take 1 tablet (10 mg total) by mouth 3 (three) times daily.   isosorbide mononitrate 30 MG 24 hr tablet Commonly known as:  IMDUR 1/2 tablet (15 mg) by mouth daily   metFORMIN 500 MG tablet Commonly known as:  GLUCOPHAGE Take 500 mg by mouth 2 (two) times daily with a meal.   multivitamin with minerals Tabs tablet Take 1 tablet by mouth 2 (two) times daily.   oxybutynin 5 MG 24 hr tablet Commonly known as:  DITROPAN-XL Take 5 mg by mouth daily.   potassium chloride 10 MEQ tablet Commonly known as:  K-DUR Take 10 mEq by mouth daily.   PRECISION XTRA TEST STRIPS test strip Generic drug:  glucose blood   tamsulosin 0.4 MG Caps capsule Commonly known as:  FLOMAX Take 0.4 mg by mouth 2 (two) times daily.   VITAMIN D3 PO Take 1 tablet by mouth daily.        Outstanding Labs/Studies   BMET on 3/2  Duration of Discharge Encounter   Greater than 30 minutes including physician time.  Signed, Reino Bellis NP-C 06/20/2018, 10:51 AM   I have examined the patient and reviewed assessment and plan and discussed with patient.  Agree with above  as stated.  Breathing better.  Mild renal insufficiency.  Recheck BMet early next week.   No orthopnea.  Swelling persists.  Elevate legs as well.  Lasix should help.  Likely related to pulmonary HTN.  WOuld consider w/u for OSA.  Larae Grooms

## 2018-06-20 NOTE — Progress Notes (Signed)
PT received discharge instructions and provided education. PT iv removed and intact. PT denies complaints. Vitals stable. Family at bedside. Pt has all belongings. Volunteers called to tx pt via wheelchair to meet ride at ED. Jerald Kief, RN

## 2018-06-20 NOTE — Progress Notes (Addendum)
Progress Note  Patient Name: Allen Dyer Date of Encounter: 06/20/2018  Primary Cardiologist: No primary care provider on file.   Subjective   Feeling so much better today.   Inpatient Medications    Scheduled Meds: . aspirin  81 mg Oral Daily  . atorvastatin  40 mg Oral q1800  . carvedilol  6.25 mg Oral BID WC  . enoxaparin (LOVENOX) injection  40 mg Subcutaneous Q24H  . hydrALAZINE  10 mg Oral Q8H  . insulin aspart  0-15 Units Subcutaneous TID WC  . ipratropium-albuterol  3 mL Nebulization Once  . isosorbide mononitrate  15 mg Oral Daily  . sodium chloride flush  3 mL Intravenous Q12H   Continuous Infusions: . sodium chloride     PRN Meds: sodium chloride, acetaminophen, albuterol, diazepam, hydrALAZINE, ondansetron (ZOFRAN) IV, sodium chloride flush   Vital Signs    Vitals:   06/20/18 0537 06/20/18 0539 06/20/18 0805 06/20/18 0828  BP: (!) 147/112 (!) 141/97 139/79 139/79  Pulse: 73 81 87 87  Resp: 15 15 17    Temp: 97.7 F (36.5 C)  98.3 F (36.8 C)   TempSrc: Oral  Oral   SpO2: 100% 99% 91%   Weight:      Height:        Intake/Output Summary (Last 24 hours) at 06/20/2018 0846 Last data filed at 06/20/2018 0500 Gross per 24 hour  Intake 410 ml  Output 825 ml  Net -415 ml   Last 3 Weights 06/18/2018 06/14/2018  Weight (lbs) 221 lb 221 lb 12.8 oz  Weight (kg) 100.245 kg 100.608 kg      Telemetry    SR with PVCs - Personally Reviewed  ECG    N/a - Personally Reviewed  Physical Exam   GEN: No acute distress.   Neck: + JVD Cardiac: RRR, soft systolic murmur, rubs, or gallops.  Respiratory: Clear to auscultation bilaterally. GI: Soft, nontender, non-distended  MS: Trace to the LLE, 1+ on the RLE; No deformity. Neuro:  Nonfocal  Psych: Normal affect   Labs    Chemistry Recent Labs  Lab 06/18/18 0620  06/18/18 0813 06/19/18 0305 06/20/18 0149  NA 140   < > 138 140 139  K 3.5   < > 3.4* 3.5 3.9  CL 101  --   --  102 101  CO2 28  --    --  28 30  GLUCOSE 100*  --   --  114* 123*  BUN 17  --   --  16 22  CREATININE 1.52*  --   --  1.56* 1.79*  CALCIUM 9.4  --   --  9.3 9.2  GFRNONAA 42*  --   --  41* 35*  GFRAA 49*  --   --  48* 40*  ANIONGAP 11  --   --  10 8   < > = values in this interval not displayed.     Hematology Recent Labs  Lab 06/14/18 1222 06/18/18 0801 06/18/18 0813 06/19/18 0305  WBC 5.5  --   --  7.5  RBC 3.67*  --   --  3.77*  HGB 11.0* 11.2*  10.9* 10.5* 11.4*  HCT 33.9* 33.0*  32.0* 31.0* 36.0*  MCV 92  --   --  95.5  MCH 30.0  --   --  30.2  MCHC 32.4  --   --  31.7  RDW 14.7  --   --  16.0*  PLT 214  --   --  213    Cardiac EnzymesNo results for input(s): TROPONINI in the last 168 hours. No results for input(s): TROPIPOC in the last 168 hours.   BNP Recent Labs  Lab 06/14/18 1222 06/18/18 1625  BNP  --  1,470.4*  PROBNP 2,678*  --      DDimer No results for input(s): DDIMER in the last 168 hours.   Radiology    No results found.  Cardiac Studies   Cath: 06/18/2018   Prox RCA lesion is 30% stenosed.  Dist RCA-1 lesion is 20% stenosed.  Dist RCA-2 lesion is 20% stenosed.  Prox Cx lesion is 10% stenosed.  Dist LM to Ost LAD lesion is 20% stenosed.  Prox LAD lesion is 50% stenosed.  Prox LAD to Mid LAD lesion is 40% stenosed.  Significant right heart pressure elevation with severe pulmonary hypertension with a PA pressure of 72/27, mean 47.  Mild to moderate nonobstructive disease with mild proximal calcification of the LAD with 20% narrowing, 50% smooth narrowing after the diagonal vessel with 40% mid stenosis and a large wraparound LAD; mild 10% narrowing in the proximal circumflex; and mild irregularity of the RCA with 30% proximal stenosis and segmental 20% distal stenoses.  Findings consistent with a nonischemic cardiomyopathy.  RECOMMENDATION: The patient was short of breath at the completion of the procedure and received Lasix 40 mg at the completion  of the procedure with prompt diuresis of over 450 cc prior to leaving the laboratory. Will initially be monitored in the holding area to assess oxygenation and additional diuresis. The patient had held his Lasix for 2 days prior to the procedure which undoubtedly contributed to his significant right heart pressure elevation. Guideline directed medical therapy for his cardiomyopathy will be necessary. Medical therapy initially for his CAD. Initiation of lipid-lowering therapy. Optimal blood pressure control with ideal blood pressure less than 120/80. Patient will follow-up with Dr. Golden Hurter.  Patient Profile     83 y.o. male with PMH of DM, Rheumatic fever, HTN, HL, CKD stage III, prostate CA who was referred for outpatient cath and developed pulmonary edema in the setting of holding lasix prior to cath.    Assessment & Plan    1. Acute CHF: UOP -5L, much improved. Walked in the hallway yesterday and felt well. Will restart home lasix dose of 40mg  PO today. -- continue BB, baseline CKD therefore no ACE (cough in the past)/ARB. May be able to add as outpatient or spiro.   2. HTN: stable  3. CKD stage III: Cr stable at 1.7 today, baseline 1.5-1.6?Marland Kitchen  - BMET in the morning  4. DM: SSI while admitted.   For questions or updates, please contact [place] Please consult [email or web address] for contact info under     Signed, Reino Bellis, NP  06/20/2018, 8:46 AM    I have examined the patient and reviewed assessment and plan and discussed with patient.  Agree with above as stated.  Breathing better.  Mild renal insufficiency.  Recheck BMet early next week.   No orthopnea.  Swelling persists.  Elevate legs as well.  Lasix should help.  Likely related to pulmonary HTN.  WOuld consider w/u for OSA.  Larae Grooms

## 2018-06-21 NOTE — Addendum Note (Signed)
Addended by: Patterson Hammersmith A on: 06/21/2018 04:22 PM   Modules accepted: Orders

## 2018-06-24 ENCOUNTER — Other Ambulatory Visit: Payer: Medicare Other | Admitting: *Deleted

## 2018-06-24 DIAGNOSIS — Z79899 Other long term (current) drug therapy: Secondary | ICD-10-CM | POA: Diagnosis not present

## 2018-06-24 LAB — BASIC METABOLIC PANEL
BUN/Creatinine Ratio: 12 (ref 10–24)
BUN: 22 mg/dL (ref 8–27)
CO2: 25 mmol/L (ref 20–29)
Calcium: 8.9 mg/dL (ref 8.6–10.2)
Chloride: 102 mmol/L (ref 96–106)
Creatinine, Ser: 1.78 mg/dL — ABNORMAL HIGH (ref 0.76–1.27)
GFR calc Af Amer: 40 mL/min/{1.73_m2} — ABNORMAL LOW (ref >59)
GFR calc non Af Amer: 35 mL/min/{1.73_m2} — ABNORMAL LOW (ref >59)
Glucose: 192 mg/dL — ABNORMAL HIGH (ref 65–99)
Potassium: 4.1 mmol/L (ref 3.5–5.2)
Sodium: 141 mmol/L (ref 134–144)

## 2018-06-26 ENCOUNTER — Encounter: Payer: Self-pay | Admitting: Physician Assistant

## 2018-06-26 NOTE — Progress Notes (Signed)
Cardiology Office Note    Date:  07/01/2018  ID:  Christian Borgerding, DOB 1935-09-11, MRN [id number] PCP:  [name], MD  Cardiologist:  [name], MD   Chief Complaint: f/u cath, CHF  History of Present Illness:  Allen Dyer is a 83 y.o. male with history of type 2 diabetes mellitus, rheumatic fever, hypertension, peripheral neuropathy with edema, hyperlipidemia, CKD stage III, prostate CA status post XRT, trifascicular block, mild dilation ascending aorta by echo 05/2018, and recently diagnosed combined CHF/NICM with nonobstructive CAD. He was seen by PCP recently due to shortness of breath and lower extremity edema and was diagnosed with CHF based on elevated EF and pulmonary edema on CXR. 2D echo was done in our office 05/29/2018 showing moderate to severe LV dysfunction with EF 30 to 35% with moderately dilated LV, grade 2 diastolic dysfunction, moderate pulmonary hypertension with PASP 59 mmHg, moderate LAE, mild AI. At OV 06/14/18 Dr. Radford Pax changed metoprolol to carvedilol and added Imdur/hydralazine. He underwent cardiac cath 06/18/18 showing mild nonobstructive CAD c/w NICM, severe pulmonary HTN. He was SOB at completion of the procedure and required IV diuretic in the cath lab (450cc UOP immediately), with overnight admission for diuresis. He had had to hold his Lasix prior to cath due to CKD which was felt to be contributing. Last labs 06/24/18 showed Cr 1.78, K 4.1, Hgb 11.4.  He returns for follow-up with a 7lb weight gain by home scales. His symptoms have recurred with DOE, LEE and orthopnea. No chest pain or palpitations. No syncope. He is abiding by sodium and fluid restriction. His daughter notes she's previously had a pulse ox on him and noticed his oxygen would dip while sleeping, but also occasionally while sitting in a chair leading up to recent diagnosis. His med list says Lasix 40mg  once daily but he affirms he is taking it BID. He recently had atorvastatin prescribed but has not  started this because he had a true allergic reaction with simvastatin with hives and blistering.   Past Medical History:  Diagnosis Date  . Chronic combined systolic and diastolic CHF (congestive heart failure) ([place])   . CKD (chronic kidney disease), stage III ([place])   . Hyperlipidemia   . Hypertension   . Lower extremity edema   . Mild CAD    a. mild-mod by cath 05/2018.  Marland Kitchen NICM (nonischemic cardiomyopathy) ([place])   . Peripheral neuropathy   . Prostate cancer ([place])    Status post XRT  . Rheumatic fever   . Trifascicular block   . Type 2 diabetes mellitus ([place])     Past Surgical History:  Procedure Laterality Date  . RIGHT/LEFT HEART CATH AND CORONARY ANGIOGRAPHY N/A 06/18/2018   Procedure: RIGHT/LEFT HEART CATH AND CORONARY ANGIOGRAPHY;  Surgeon: Troy Sine, MD;  Location: [facility];  Service: Cardiovascular;  Laterality: N/A;    Current Medications: Current Meds  Medication Sig  . aspirin EC 81 MG tablet Take 81 mg by mouth every evening.   . carvedilol (COREG) 6.25 MG tablet Take 1 tablet (6.25 mg total) by mouth 2 (two) times daily.  . Cholecalciferol (VITAMIN D3 PO) Take 1 tablet by mouth daily.   . Cyanocobalamin (B-12 PO) Take 1 tablet by mouth daily.   . furosemide (LASIX) 40 MG tablet Take 40 mg by mouth daily.  Marland Kitchen gabapentin (NEURONTIN) 800 MG tablet Take 800 mg by mouth 2 (two) times daily.   Marland Kitchen glipiZIDE (GLUCOTROL) 10 MG tablet Take 10 mg by  mouth 2 (two) times daily.   . hydrALAZINE (APRESOLINE) 10 MG tablet Take 1 tablet (10 mg total) by mouth 3 (three) times daily.  . isosorbide mononitrate (IMDUR) 30 MG 24 hr tablet 1/2 tablet (15 mg) by mouth daily  . metFORMIN (GLUCOPHAGE) 500 MG tablet Take 500 mg by mouth 2 (two) times daily with a meal.   . Multiple Vitamin (MULTIVITAMIN WITH MINERALS) TABS tablet Take 1 tablet by mouth 2 (two) times daily.  Marland Kitchen oxybutynin (DITROPAN-XL) 5 MG 24 hr tablet Take 5 mg by mouth daily.   . potassium chloride (K-DUR) 10  MEQ tablet Take 10 mEq by mouth daily.   Marland Kitchen PRECISION XTRA TEST STRIPS test strip   . tamsulosin (FLOMAX) 0.4 MG CAPS capsule Take 0.4 mg by mouth 2 (two) times daily.     Allergies:   Tramadol; Finasteride; Lisinopril; Ciprofloxacin; Simvastatin; and Sulfa antibiotics   Social History   Socioeconomic History  . Marital status: Widowed    Spouse name: Not on file  . Number of children: Not on file  . Years of education: Not on file  . Highest education level: Not on file  Occupational History  . Not on file  Social Needs  . Financial resource strain: Not on file  . Food insecurity:    Worry: Not on file    Inability: Not on file  . Transportation needs:    Medical: Not on file    Non-medical: Not on file  Tobacco Use  . Smoking status: Never Smoker  . Smokeless tobacco: Never Used  Substance and Sexual Activity  . Alcohol use: Never    Frequency: Never  . Drug use: Never  . Sexual activity: Not on file  Lifestyle  . Physical activity:    Days per week: Not on file    Minutes per session: Not on file  . Stress: Not on file  Relationships  . Social connections:    Talks on phone: Not on file    Gets together: Not on file    Attends religious service: Not on file    Active member of club or organization: Not on file    Attends meetings of clubs or organizations: Not on file    Relationship status: Not on file  Other Topics Concern  . Not on file  Social History Narrative  . Not on file     Family History:  The patient's family history includes Congestive Heart Failure in his father.  ROS:   Please see the history of present illness.  All other systems are reviewed and otherwise negative.    PHYSICAL EXAM:   VS:  BP (!) 142/76   Pulse 92   Ht 5' 7.5" (1.715 m)   Wt 231 lb (104.8 kg)   SpO2 93%   BMI 35.65 kg/m   BMI: Body mass index is 35.65 kg/m. GEN: Well nourished, well developed AAM in no acute distress. Smiling, jovial. HEENT: normocephalic,  atraumatic Neck: no JVD, carotid bruits, or masses Cardiac: RRR with occasional ectopy; no murmurs, rubs, or gallops, 2+ stiff BLE edema  Respiratory:  Diminished at bases otherwise no wheezes, rales, rhonchi. Normal work of breathing GI: soft, nontender, nondistended, + BS MS: no deformity or atrophy Skin: warm and dry, no rash. Right radial cath site without hematoma or ecchymosis; good pulse. Neuro:  Alert and Oriented x 3, Strength and sensation are intact, follows commands Psych: euthymic mood, full affect  Wt Readings from Last 3 Encounters:  07/01/18  231 lb (104.8 kg)  06/18/18 221 lb (100.2 kg)  06/14/18 221 lb 12.8 oz (100.6 kg)      Studies/Labs Reviewed:   EKG:  EKG was not ordered today  Recent Labs: 06/14/2018: NT-Pro BNP 2,678 06/18/2018: B Natriuretic Peptide 1,470.4 06/19/2018: Hemoglobin 11.4; Platelets 213 06/24/2018: BUN 22; Creatinine, Ser 1.78; Potassium 4.1; Sodium 141   Lipid Panel No results found for: CHOL, TRIG, HDL, CHOLHDL, VLDL, LDLCALC, LDLDIRECT  Additional studies/ records that were reviewed today include: Summarized above    ASSESSMENT & PLAN:   1. Acute on chronic combined CHF/NICM - appears volume overloaded. Will update BMET and BNP today. He reports he is taking Lasix 40mg  BID instead of daily already. Will further increase to 80mg  BID. Reviewed 2g sodium restriction, 2L fluid restriction, daily weights with patient. I told him that if he does not begin to mobilize fluid or symptoms escalate to call us at which time I'd recommend a switch to torsemide 40mg  BID versus add a dose of metolazone. His kidney function will likely make management somewhat challenging. Given need to titrate Lasix I do not think we should start ARB or spironolactone just yet. He is already on afterload reduction with hydralazine/Imdur in the meantime. Will follow BP with diuresis and consider further titration as he progresses.  2. Pulmonary HTN - recommend sleep study,  will arrange. Will also obtain baseline 2V CXR to evaluate lung fields. If dyspnea continues despite euvolemia, could consider pulmonary eval to exclude any lung disease contributing to pHTN. Difficult to know if this is related to CHF versus an alternative cause like sleep apnea. Will need reassessment of pulmonary pressures once well-diuresed and if still abnormal consider additional w/u. His pulse ox was 91% at discharge in late February, is 93% today. Discussed plan with Dr. Radford Pax who is in agreement. 3. Trifascicular block- EKG shows trifasicuclar block actually with first degree AVB, RBBB, LAFB. Will hold off beta blocker titration at present time. Monitor for bradycardia. 4. CKD III - recheck BMET today. Advised to avoid NSAIDS. 5. CAD - given ongoing issues with CHF, do not think we should re-challenge with statin right now. I discussed with pharmacist and if he truly had blisters/hives with simvastatin there is a chance that he could have this with other statins. The recommendation would be to consider something that was different structurally, such as low dose Crestor. She does not feel Zetia monotherapy would typically convey the same cardiovascular benefit in the absence of statin therapy. We can consider PCSK9 down the road. However, I think we should table this discussion for now and focus on HF management so that we do not complicate the picture in case he does have a med reaction. I worry the adrenergic response (if it occurred) would stress his heart.  Disposition: F/u with Dr. Radford Pax or care team APP in 1 week (I do not have any availability).  Medication Adjustments/Labs and Tests Ordered: Current medicines are reviewed at length with the patient today.  Concerns regarding medicines are outlined above. Medication changes, Labs and Tests ordered today are summarized above and listed in the Patient Instructions accessible in Encounters.   Signed, Charlie Pitter, PA-C  07/01/2018 11:47 AM      [address], [place], [place]  [address] Phone: [phone]; Fax: [phone]

## 2018-07-01 ENCOUNTER — Ambulatory Visit (INDEPENDENT_AMBULATORY_CARE_PROVIDER_SITE_OTHER): Payer: Medicare Other | Admitting: Physician Assistant

## 2018-07-01 ENCOUNTER — Ambulatory Visit
Admission: RE | Admit: 2018-07-01 | Discharge: 2018-07-01 | Disposition: A | Discharge status: Home / Self Care | Payer: Medicare Other | Source: Ambulatory Visit | Attending: Physician Assistant | Admitting: Physician Assistant

## 2018-07-01 ENCOUNTER — Encounter: Payer: Self-pay | Admitting: Physician Assistant

## 2018-07-01 ENCOUNTER — Telehealth: Payer: Self-pay | Admitting: *Deleted

## 2018-07-01 VITALS — BP 142/76 | HR 92 | Ht 67.5 in | Wt 231.0 lb

## 2018-07-01 DIAGNOSIS — I452 Bifascicular block: Secondary | ICD-10-CM

## 2018-07-01 DIAGNOSIS — I5043 Acute on chronic combined systolic (congestive) and diastolic (congestive) heart failure: Secondary | ICD-10-CM

## 2018-07-01 DIAGNOSIS — N183 Chronic kidney disease, stage 3 unspecified: Secondary | ICD-10-CM

## 2018-07-01 DIAGNOSIS — I272 Pulmonary hypertension, unspecified: Secondary | ICD-10-CM | POA: Diagnosis not present

## 2018-07-01 DIAGNOSIS — I10 Essential (primary) hypertension: Secondary | ICD-10-CM

## 2018-07-01 DIAGNOSIS — J9811 Atelectasis: Secondary | ICD-10-CM | POA: Diagnosis not present

## 2018-07-01 DIAGNOSIS — J90 Pleural effusion, not elsewhere classified: Secondary | ICD-10-CM | POA: Diagnosis not present

## 2018-07-01 MED ORDER — FUROSEMIDE 40 MG PO TABS: 80.0000 mg | ORAL_TABLET | Freq: Two times a day (BID) | ORAL | 3 refills | Status: DC

## 2018-07-01 NOTE — Telephone Encounter (Signed)
-----   Message from Jeanann Lewandowsky, [place] sent at 07/01/2018 12:12 PM EDT ----- Regarding: sleep study PT NEEDS A SLEEP STUDY   THANKS

## 2018-07-01 NOTE — Patient Instructions (Addendum)
Medication Instructions:  Your physician has recommended you make the following change in your medication:  1.  STOP the Atorvastatin 2.  INCREASE the Lasix to 40 MG TAKING 2 TABLETS TWICE A DAY   If you need a refill on your cardiac medications before your next appointment, please call your pharmacy.   Lab work: TODAY:  BMET & PRO BNP  If you have labs (blood work) drawn today and your tests are completely normal, you will receive your results only by: Marland Kitchen MyChart Message (if you have MyChart) OR . A paper copy in the mail If you have any lab test that is abnormal or we need to change your treatment, we will call you to review the results.  Testing/Procedures: A chest x-ray takes a picture of the organs and structures inside the chest, including the heart, lungs, and blood vessels. This test can show several things, including, whether the heart is enlarges; whether fluid is building up in the lungs; and whether pacemaker / defibrillator leads are still in place.  [facility] [place]  Your physician has recommended that you have a sleep study.  They will you to schedule this in about a week or so.  This test records several body functions during sleep, including: brain activity, eye movement, oxygen and carbon dioxide blood levels, heart rate and rhythm, breathing rate and rhythm, the flow of air through your mouth and nose, snoring, body muscle movements, and chest and belly movement.    Follow-Up: Your physician recommends that you schedule a follow-up appointment in: [facility] TE[facility]   Any Other Special Instructions Will Be Listed Below (If Applicable).  Just an FYI - Patients with kidney issues should generally stay away from medicines like ibuprofen, Advil, Motrin, naproxen, and Aleve due to risk of worsening kidney function so do not take any of these medicines. You may take Tylenol as directed or talk to primary doctor about  alternatives for pain issues.

## 2018-07-02 ENCOUNTER — Telehealth: Payer: Self-pay

## 2018-07-02 DIAGNOSIS — N183 Chronic kidney disease, stage 3 unspecified: Secondary | ICD-10-CM

## 2018-07-02 LAB — BASIC METABOLIC PANEL
BUN/Creatinine Ratio: 14 (ref 10–24)
BUN: 27 mg/dL (ref 8–27)
CO2: 27 mmol/L (ref 20–29)
Calcium: 9.3 mg/dL (ref 8.6–10.2)
Chloride: 100 mmol/L (ref 96–106)
Creatinine, Ser: 1.98 mg/dL — ABNORMAL HIGH (ref 0.76–1.27)
GFR calc Af Amer: 36 mL/min/{1.73_m2} — ABNORMAL LOW (ref >59)
GFR calc non Af Amer: 31 mL/min/{1.73_m2} — ABNORMAL LOW (ref >59)
Glucose: 172 mg/dL — ABNORMAL HIGH (ref 65–99)
Potassium: 4.2 mmol/L (ref 3.5–5.2)
Sodium: 143 mmol/L (ref 134–144)

## 2018-07-02 LAB — PRO B NATRIURETIC PEPTIDE: NT-Pro BNP: 5013 pg/mL — ABNORMAL HIGH (ref 0–486)

## 2018-07-02 NOTE — Telephone Encounter (Signed)
Called pt regarding results, he verbalized understanding. Nephro referral placed.

## 2018-07-02 NOTE — Telephone Encounter (Signed)
-----   Message from Charlie Pitter, [place] sent at 07/02/2018 10:45 AM EDT ----- Please let patient know labs show elevated BNP level higher than prior consistent with CHF. His Cr is higher but I suspect this is a cardiorenal effect from his fluid overload. Continue plan as discussed yesterday with higher dose of diuretic, but would also like to initiate a referral to nephrology for CKD stage III in context of CHF. Please also see CXR result. Keep close f/u as planned. Let us know if fluid does not start to mobilize in 1-2 days Dayna Dunn PA-C

## 2018-07-03 ENCOUNTER — Emergency Department (HOSPITAL_COMMUNITY): Payer: Medicare Other

## 2018-07-03 ENCOUNTER — Encounter (HOSPITAL_COMMUNITY): Payer: Self-pay

## 2018-07-03 ENCOUNTER — Inpatient Hospital Stay (HOSPITAL_COMMUNITY)
Admission: EM | Admit: 2018-07-03 | Discharge: 2018-07-10 | DRG: 286 | Disposition: A | Discharge status: Home Health | Payer: Medicare Other | Attending: Family Medicine | Admitting: Family Medicine

## 2018-07-03 ENCOUNTER — Other Ambulatory Visit: Payer: Self-pay

## 2018-07-03 DIAGNOSIS — N183 Chronic kidney disease, stage 3 unspecified: Secondary | ICD-10-CM | POA: Diagnosis present

## 2018-07-03 DIAGNOSIS — G629 Polyneuropathy, unspecified: Secondary | ICD-10-CM | POA: Diagnosis present

## 2018-07-03 DIAGNOSIS — I130 Hypertensive heart and chronic kidney disease with heart failure and stage 1 through stage 4 chronic kidney disease, or unspecified chronic kidney disease: Principal | ICD-10-CM | POA: Diagnosis present

## 2018-07-03 DIAGNOSIS — I5022 Chronic systolic (congestive) heart failure: Secondary | ICD-10-CM | POA: Diagnosis present

## 2018-07-03 DIAGNOSIS — Z79899 Other long term (current) drug therapy: Secondary | ICD-10-CM

## 2018-07-03 DIAGNOSIS — Z7982 Long term (current) use of aspirin: Secondary | ICD-10-CM

## 2018-07-03 DIAGNOSIS — I110 Hypertensive heart disease with heart failure: Secondary | ICD-10-CM | POA: Diagnosis not present

## 2018-07-03 DIAGNOSIS — G4733 Obstructive sleep apnea (adult) (pediatric): Secondary | ICD-10-CM | POA: Diagnosis present

## 2018-07-03 DIAGNOSIS — I088 Other rheumatic multiple valve diseases: Secondary | ICD-10-CM | POA: Diagnosis present

## 2018-07-03 DIAGNOSIS — I5021 Acute systolic (congestive) heart failure: Secondary | ICD-10-CM | POA: Diagnosis present

## 2018-07-03 DIAGNOSIS — J9601 Acute respiratory failure with hypoxia: Secondary | ICD-10-CM | POA: Diagnosis present

## 2018-07-03 DIAGNOSIS — I428 Other cardiomyopathies: Secondary | ICD-10-CM | POA: Diagnosis present

## 2018-07-03 DIAGNOSIS — I2510 Atherosclerotic heart disease of native coronary artery without angina pectoris: Secondary | ICD-10-CM | POA: Diagnosis present

## 2018-07-03 DIAGNOSIS — M25531 Pain in right wrist: Secondary | ICD-10-CM | POA: Diagnosis not present

## 2018-07-03 DIAGNOSIS — Z923 Personal history of irradiation: Secondary | ICD-10-CM

## 2018-07-03 DIAGNOSIS — R05 Cough: Secondary | ICD-10-CM | POA: Diagnosis not present

## 2018-07-03 DIAGNOSIS — E876 Hypokalemia: Secondary | ICD-10-CM | POA: Diagnosis not present

## 2018-07-03 DIAGNOSIS — I5043 Acute on chronic combined systolic (congestive) and diastolic (congestive) heart failure: Secondary | ICD-10-CM

## 2018-07-03 DIAGNOSIS — E119 Type 2 diabetes mellitus without complications: Secondary | ICD-10-CM

## 2018-07-03 DIAGNOSIS — I5042 Chronic combined systolic (congestive) and diastolic (congestive) heart failure: Secondary | ICD-10-CM | POA: Diagnosis present

## 2018-07-03 DIAGNOSIS — I4510 Unspecified right bundle-branch block: Secondary | ICD-10-CM | POA: Diagnosis present

## 2018-07-03 DIAGNOSIS — R0602 Shortness of breath: Secondary | ICD-10-CM

## 2018-07-03 DIAGNOSIS — Z6834 Body mass index (BMI) 34.0-34.9, adult: Secondary | ICD-10-CM

## 2018-07-03 DIAGNOSIS — D631 Anemia in chronic kidney disease: Secondary | ICD-10-CM | POA: Diagnosis present

## 2018-07-03 DIAGNOSIS — N184 Chronic kidney disease, stage 4 (severe): Secondary | ICD-10-CM | POA: Diagnosis present

## 2018-07-03 DIAGNOSIS — Z888 Allergy status to other drugs, medicaments and biological substances status: Secondary | ICD-10-CM

## 2018-07-03 DIAGNOSIS — E1110 Type 2 diabetes mellitus with ketoacidosis without coma: Secondary | ICD-10-CM

## 2018-07-03 DIAGNOSIS — I77810 Thoracic aortic ectasia: Secondary | ICD-10-CM | POA: Diagnosis present

## 2018-07-03 DIAGNOSIS — Z8249 Family history of ischemic heart disease and other diseases of the circulatory system: Secondary | ICD-10-CM

## 2018-07-03 DIAGNOSIS — Z8546 Personal history of malignant neoplasm of prostate: Secondary | ICD-10-CM

## 2018-07-03 DIAGNOSIS — Z881 Allergy status to other antibiotic agents status: Secondary | ICD-10-CM

## 2018-07-03 DIAGNOSIS — N179 Acute kidney failure, unspecified: Secondary | ICD-10-CM | POA: Diagnosis present

## 2018-07-03 DIAGNOSIS — Z7984 Long term (current) use of oral hypoglycemic drugs: Secondary | ICD-10-CM

## 2018-07-03 DIAGNOSIS — I509 Heart failure, unspecified: Secondary | ICD-10-CM

## 2018-07-03 DIAGNOSIS — I10 Essential (primary) hypertension: Secondary | ICD-10-CM | POA: Diagnosis present

## 2018-07-03 DIAGNOSIS — E785 Hyperlipidemia, unspecified: Secondary | ICD-10-CM | POA: Diagnosis present

## 2018-07-03 DIAGNOSIS — I272 Pulmonary hypertension, unspecified: Secondary | ICD-10-CM | POA: Diagnosis not present

## 2018-07-03 DIAGNOSIS — Z885 Allergy status to narcotic agent status: Secondary | ICD-10-CM

## 2018-07-03 DIAGNOSIS — Z882 Allergy status to sulfonamides status: Secondary | ICD-10-CM

## 2018-07-03 DIAGNOSIS — N400 Enlarged prostate without lower urinary tract symptoms: Secondary | ICD-10-CM | POA: Diagnosis present

## 2018-07-03 DIAGNOSIS — I453 Trifascicular block: Secondary | ICD-10-CM | POA: Diagnosis present

## 2018-07-03 DIAGNOSIS — R0902 Hypoxemia: Secondary | ICD-10-CM

## 2018-07-03 DIAGNOSIS — Z87891 Personal history of nicotine dependence: Secondary | ICD-10-CM

## 2018-07-03 DIAGNOSIS — E1122 Type 2 diabetes mellitus with diabetic chronic kidney disease: Secondary | ICD-10-CM | POA: Diagnosis present

## 2018-07-03 DIAGNOSIS — I440 Atrioventricular block, first degree: Secondary | ICD-10-CM | POA: Diagnosis present

## 2018-07-03 DIAGNOSIS — E669 Obesity, unspecified: Secondary | ICD-10-CM | POA: Diagnosis present

## 2018-07-03 LAB — CBC WITH DIFFERENTIAL/PLATELET
Abs Immature Granulocytes: 0.01 10*3/uL (ref 0.00–0.07)
Basophils Absolute: 0 10*3/uL (ref 0.0–0.1)
Basophils Relative: 0 %
Eosinophils Absolute: 0.1 10*3/uL (ref 0.0–0.5)
Eosinophils Relative: 2 %
HCT: 32.9 % — ABNORMAL LOW (ref 39.0–52.0)
Hemoglobin: 9.8 g/dL — ABNORMAL LOW (ref 13.0–17.0)
Immature Granulocytes: 0 %
Lymphocytes Relative: 15 %
Lymphs Abs: 0.8 10*3/uL (ref 0.7–4.0)
MCH: 28.8 pg (ref 26.0–34.0)
MCHC: 29.8 g/dL — ABNORMAL LOW (ref 30.0–36.0)
MCV: 96.8 fL (ref 80.0–100.0)
Monocytes Absolute: 0.6 10*3/uL (ref 0.1–1.0)
Monocytes Relative: 11 %
Neutro Abs: 3.9 10*3/uL (ref 1.7–7.7)
Neutrophils Relative %: 72 %
Platelets: 194 10*3/uL (ref 150–400)
RBC: 3.4 MIL/uL — ABNORMAL LOW (ref 4.22–5.81)
RDW: 16.1 % — ABNORMAL HIGH (ref 11.5–15.5)
WBC: 5.4 10*3/uL (ref 4.0–10.5)
nRBC: 0 % (ref 0.0–0.2)

## 2018-07-03 LAB — CREATININE, SERUM
Creatinine, Ser: 2.08 mg/dL — ABNORMAL HIGH (ref 0.61–1.24)
GFR calc Af Amer: 34 mL/min — ABNORMAL LOW (ref >60)
GFR calc non Af Amer: 29 mL/min — ABNORMAL LOW (ref >60)

## 2018-07-03 LAB — COMPREHENSIVE METABOLIC PANEL
ALT: 59 U/L — ABNORMAL HIGH (ref 0–44)
AST: 40 U/L (ref 15–41)
Albumin: 3.2 g/dL — ABNORMAL LOW (ref 3.5–5.0)
Alkaline Phosphatase: 74 U/L (ref 38–126)
Anion gap: 8 (ref 5–15)
BUN: 27 mg/dL — ABNORMAL HIGH (ref 8–23)
CO2: 31 mmol/L (ref 22–32)
Calcium: 9.3 mg/dL (ref 8.9–10.3)
Chloride: 104 mmol/L (ref 98–111)
Creatinine, Ser: 2.16 mg/dL — ABNORMAL HIGH (ref 0.61–1.24)
GFR calc Af Amer: 32 mL/min — ABNORMAL LOW (ref >60)
GFR calc non Af Amer: 28 mL/min — ABNORMAL LOW (ref >60)
Glucose, Bld: 80 mg/dL (ref 70–99)
Potassium: 3.5 mmol/L (ref 3.5–5.1)
Sodium: 143 mmol/L (ref 135–145)
Total Bilirubin: 0.7 mg/dL (ref 0.3–1.2)
Total Protein: 6.9 g/dL (ref 6.5–8.1)

## 2018-07-03 LAB — MAGNESIUM: Magnesium: 2.2 mg/dL (ref 1.7–2.4)

## 2018-07-03 LAB — CBC
HCT: 33.5 % — ABNORMAL LOW (ref 39.0–52.0)
Hemoglobin: 10.1 g/dL — ABNORMAL LOW (ref 13.0–17.0)
MCH: 29.2 pg (ref 26.0–34.0)
MCHC: 30.1 g/dL (ref 30.0–36.0)
MCV: 96.8 fL (ref 80.0–100.0)
Platelets: 198 10*3/uL (ref 150–400)
RBC: 3.46 MIL/uL — ABNORMAL LOW (ref 4.22–5.81)
RDW: 16.2 % — ABNORMAL HIGH (ref 11.5–15.5)
WBC: 5.5 10*3/uL (ref 4.0–10.5)
nRBC: 0 % (ref 0.0–0.2)

## 2018-07-03 LAB — BRAIN NATRIURETIC PEPTIDE: B Natriuretic Peptide: 1606.3 pg/mL — ABNORMAL HIGH (ref 0.0–100.0)

## 2018-07-03 LAB — I-STAT TROPONIN, ED: Troponin i, poc: 0.04 ng/mL (ref 0.00–0.08)

## 2018-07-03 LAB — GLUCOSE, CAPILLARY: Glucose-Capillary: 127 mg/dL — ABNORMAL HIGH (ref 70–99)

## 2018-07-03 MED ORDER — FUROSEMIDE 10 MG/ML IJ SOLN: 80.0000 mg | Freq: Once | INTRAMUSCULAR | Status: DC

## 2018-07-03 MED ORDER — FUROSEMIDE 10 MG/ML IJ SOLN
40.0000 mg | Freq: Once | INTRAMUSCULAR | Status: AC
  Administered 2018-07-03: 40 mg via INTRAVENOUS
  Filled 2018-07-03: qty 4

## 2018-07-03 MED ORDER — FUROSEMIDE 10 MG/ML IJ SOLN
40.0000 mg | Freq: Once | INTRAMUSCULAR | Status: DC
  Filled 2018-07-03: qty 4

## 2018-07-03 MED ORDER — CARVEDILOL 6.25 MG PO TABS
6.2500 mg | ORAL_TABLET | Freq: Two times a day (BID) | ORAL | Status: DC
  Administered 2018-07-03 – 2018-07-10 (×14): 6.25 mg via ORAL
  Filled 2018-07-03 (×14): qty 1

## 2018-07-03 MED ORDER — HYDRALAZINE HCL 10 MG PO TABS: 10.0000 mg | ORAL_TABLET | Freq: Three times a day (TID) | ORAL | Status: DC

## 2018-07-03 MED ORDER — FUROSEMIDE 10 MG/ML IJ SOLN: 40.0000 mg | Freq: Once | INTRAMUSCULAR | Status: DC

## 2018-07-03 MED ORDER — ISOSORBIDE MONONITRATE ER 30 MG PO TB24: 15.0000 mg | ORAL_TABLET | Freq: Every day | ORAL | Status: DC

## 2018-07-03 MED ORDER — ISOSORBIDE MONONITRATE ER 30 MG PO TB24
15.0000 mg | ORAL_TABLET | Freq: Every day | ORAL | Status: DC
  Administered 2018-07-04 – 2018-07-09 (×6): 15 mg via ORAL
  Filled 2018-07-03 (×6): qty 1

## 2018-07-03 MED ORDER — OXYBUTYNIN CHLORIDE ER 5 MG PO TB24
5.0000 mg | ORAL_TABLET | Freq: Every day | ORAL | Status: DC
  Filled 2018-07-03: qty 1

## 2018-07-03 MED ORDER — HYDRALAZINE HCL 10 MG PO TABS
10.0000 mg | ORAL_TABLET | Freq: Three times a day (TID) | ORAL | Status: DC
  Administered 2018-07-03 – 2018-07-05 (×5): 10 mg via ORAL
  Filled 2018-07-03 (×5): qty 1

## 2018-07-03 MED ORDER — FUROSEMIDE 10 MG/ML IJ SOLN
80.0000 mg | Freq: Two times a day (BID) | INTRAMUSCULAR | Status: DC
  Administered 2018-07-04 – 2018-07-07 (×8): 80 mg via INTRAVENOUS
  Filled 2018-07-03 (×8): qty 8

## 2018-07-03 MED ORDER — INSULIN ASPART 100 UNIT/ML ~~LOC~~ SOLN
0.0000 [IU] | Freq: Three times a day (TID) | SUBCUTANEOUS | Status: DC
  Administered 2018-07-04: 2 [IU] via SUBCUTANEOUS
  Administered 2018-07-04 (×2): 1 [IU] via SUBCUTANEOUS
  Administered 2018-07-05: 2 [IU] via SUBCUTANEOUS
  Administered 2018-07-05: 1 [IU] via SUBCUTANEOUS
  Administered 2018-07-05: 2 [IU] via SUBCUTANEOUS
  Administered 2018-07-06: 1 [IU] via SUBCUTANEOUS
  Administered 2018-07-06 – 2018-07-07 (×2): 2 [IU] via SUBCUTANEOUS
  Administered 2018-07-07: 1 [IU] via SUBCUTANEOUS
  Administered 2018-07-07 – 2018-07-08 (×4): 2 [IU] via SUBCUTANEOUS
  Administered 2018-07-09 (×2): 3 [IU] via SUBCUTANEOUS
  Administered 2018-07-09: 5 [IU] via SUBCUTANEOUS
  Administered 2018-07-10: 2 [IU] via SUBCUTANEOUS

## 2018-07-03 MED ORDER — TAMSULOSIN HCL 0.4 MG PO CAPS
0.4000 mg | ORAL_CAPSULE | Freq: Two times a day (BID) | ORAL | Status: DC
  Administered 2018-07-03 – 2018-07-10 (×14): 0.4 mg via ORAL
  Filled 2018-07-03 (×14): qty 1

## 2018-07-03 MED ORDER — OXYBUTYNIN CHLORIDE ER 5 MG PO TB24
5.0000 mg | ORAL_TABLET | Freq: Every day | ORAL | Status: DC
  Administered 2018-07-04 – 2018-07-10 (×7): 5 mg via ORAL
  Filled 2018-07-03 (×7): qty 1

## 2018-07-03 MED ORDER — ALBUTEROL SULFATE (2.5 MG/3ML) 0.083% IN NEBU
2.5000 mg | INHALATION_SOLUTION | RESPIRATORY_TRACT | Status: DC | PRN
  Administered 2018-07-03: 2.5 mg via RESPIRATORY_TRACT
  Filled 2018-07-03 (×2): qty 3

## 2018-07-03 MED ORDER — HEPARIN SODIUM (PORCINE) 5000 UNIT/ML IJ SOLN
5000.0000 [IU] | Freq: Three times a day (TID) | INTRAMUSCULAR | Status: DC
  Administered 2018-07-04 – 2018-07-10 (×18): 5000 [IU] via SUBCUTANEOUS
  Filled 2018-07-03 (×21): qty 1

## 2018-07-03 MED ORDER — POTASSIUM CHLORIDE CRYS ER 20 MEQ PO TBCR
20.0000 meq | EXTENDED_RELEASE_TABLET | Freq: Two times a day (BID) | ORAL | Status: AC
  Administered 2018-07-03 – 2018-07-04 (×3): 20 meq via ORAL
  Filled 2018-07-03 (×3): qty 1

## 2018-07-03 NOTE — ED Triage Notes (Signed)
Pt arrives from Dr. Jerrilyn Cairo for eval of [facility] and was noted as 89%sat on room air. Pt seen by Cardiologist Monday and told fluid around right lung with lasix increased. Pt state increased swelling and SHOB over last 2 days . Sleeping in chair.O@ noted as 89-90. Placed on 2l/[place]

## 2018-07-03 NOTE — Progress Notes (Signed)
Pt wants to speak with someone about getting oxygen at home.

## 2018-07-03 NOTE — H&P (Signed)
History and Physical    Allen Dyer DUK:[id number] DOB: 1935/12/23 DOA: 07/03/2018  I have briefly reviewed the patient's prior medical records in [facility]  PCP: [name], MD  Patient coming from: Home  Chief Complaint: Shortness of breath, swelling  HPI: Allen Dyer is a 83 y.o. male with medical history significant of chronic combined systolic and diastolic CHF, type 2 diabetes mellitus, hypertension, hyperlipidemia, chronic kidney disease stage III with baseline creatinine 1.5-1.7, recently admitted on cardiology service in February for fluid overload, was diuresed.  Underwent a 2D echo which showed moderate to severe LV dysfunction with EF of 30-35%, also had a cardiac cath at the end of February showing mild nonobstructive coronary artery disease and severe pulmonary hypertension.  Since his discharge couple weeks ago he has progressively noted increased swelling as well as weight gain of about 7 pounds.  He was evaluated by cardiology 2 days ago in office and his Lasix was increased from 40 twice daily to 80 twice daily.  Despite that, patient has continued to complain of shortness of breath and felt like his swelling is gotten even worse and decided to come to the emergency room today.  He also has been complaining of chest tightness every time he tries to ambulate and get short of breath.  He denies any chest pain currently.  He denies any abdominal pain, nausea or vomiting.  He denies any fever chills.  He denies any abdominal pain, nausea vomiting or diarrhea.  ED Course: In the emergency room he is afebrile, he is on room air, his heart rate is in the 70s and his blood pressure is stable.  His BNP is elevated at 1600.  His blood work reveals a creatinine of 2.16 which is slightly higher than his baseline, CBC relatively unremarkable.  Chest x-ray personally reviewed with some evidence of vascular congestion.  Cardiology was consulted, he will be placed on IV Lasix, and we  are asked to admit due to coexisting multiple medical problems.  Review of Systems: As per HPI otherwise 10 point review of systems negative.   Past Medical History:  Diagnosis Date  . Chronic combined systolic and diastolic CHF (congestive heart failure) ([place])   . CKD (chronic kidney disease), stage III ([place])   . Hyperlipidemia   . Hypertension   . Lower extremity edema   . Mild CAD    a. mild-mod by cath 05/2018.  Marland Kitchen NICM (nonischemic cardiomyopathy) ([place])   . Peripheral neuropathy   . Prostate cancer ([place])    Status post XRT  . Rheumatic fever   . Trifascicular block   . Type 2 diabetes mellitus ([place])     Past Surgical History:  Procedure Laterality Date  . RIGHT/LEFT HEART CATH AND CORONARY ANGIOGRAPHY N/A 06/18/2018   Procedure: RIGHT/LEFT HEART CATH AND CORONARY ANGIOGRAPHY;  Surgeon: Troy Sine, MD;  Location: [facility];  Service: Cardiovascular;  Laterality: N/A;     reports that he has never smoked. He has never used smokeless tobacco. He reports that he does not drink alcohol or use drugs.  Allergies  Allergen Reactions  . Tramadol Other (See Comments)    Dizziness (intolerance)   . Finasteride Swelling    Breast enlargement   . Lisinopril Cough  . Ciprofloxacin Other (See Comments)    Dizziness (intolerance)   . Simvastatin Hives and Other (See Comments)    Blisters/ Rash   . Sulfa Antibiotics Rash    Family History  Problem Relation  Age of Onset  . Congestive Heart Failure Father        died from heart failure at the age of 3  . Cancer Mother   . Cancer Sister   . Congestive Heart Failure Brother        died from heart failure at the age of 10    Prior to Admission medications   Medication Sig Start Date End Date Taking? Authorizing Provider  aspirin EC 81 MG tablet Take 81 mg by mouth every evening.     [provider]  carvedilol (COREG) 6.25 MG tablet Take 1 tablet (6.25 mg total) by mouth 2 (two) times daily. 06/14/18  06/14/19  [name], MD  Cholecalciferol (VITAMIN D3 PO) Take 1 tablet by mouth daily.     [provider]  Cyanocobalamin (B-12 PO) Take 1 tablet by mouth daily.     [provider]  furosemide (LASIX) 40 MG tablet Take 2 tablets (80 mg total) by mouth 2 (two) times daily. 07/01/18 09/29/18  Dunn, Nedra Hai, PA-C  gabapentin (NEURONTIN) 800 MG tablet Take 800 mg by mouth 2 (two) times daily.     [provider]  glipiZIDE (GLUCOTROL) 10 MG tablet Take 10 mg by mouth 2 (two) times daily.     [provider]  hydrALAZINE (APRESOLINE) 10 MG tablet Take 1 tablet (10 mg total) by mouth 3 (three) times daily. 06/17/18   [name], MD  isosorbide mononitrate (IMDUR) 30 MG 24 hr tablet 1/2 tablet (15 mg) by mouth daily 06/17/18   [name], MD  metFORMIN (GLUCOPHAGE) 500 MG tablet Take 500 mg by mouth 2 (two) times daily with a meal.     [provider]  Multiple Vitamin (MULTIVITAMIN WITH MINERALS) TABS tablet Take 1 tablet by mouth 2 (two) times daily.    [provider]  oxybutynin (DITROPAN-XL) 5 MG 24 hr tablet Take 5 mg by mouth daily.     [provider]  potassium chloride (K-DUR) 10 MEQ tablet Take 10 mEq by mouth daily.  08/19/15   [provider]  PRECISION XTRA TEST STRIPS test strip  03/10/17   [provider]  tamsulosin (FLOMAX) 0.4 MG CAPS capsule Take 0.4 mg by mouth 2 (two) times daily.  03/24/17   [provider]    Physical Exam: Vitals:   07/03/18 1115 07/03/18 1215 07/03/18 1246 07/03/18 1300  BP:  (!) 140/94  (!) 152/98  Pulse:  (!) 57  94  Resp:  20  (!) 28  Temp:      TempSrc:      SpO2:  97%  96%  Weight: 103 kg  101.8 kg   Height: 5' 7.5" (1.715 m)      Constitutional: NAD, calm, comfortable Eyes: PERRL, lids and conjunctivae normal ENMT: Mucous membranes are moist. Neck: normal, supple Respiratory: No wheezing heard, bibasilar crackles present.  Slightly increased  respiratory effort.  No accessory muscle use.   Cardiovascular: Regular rate and rhythm, no murmurs / rubs / gallops.  Plus pitting lower extremity edema. 2+ pedal pulses.  Abdomen: no tenderness, no masses palpated. Bowel sounds positive.  Musculoskeletal: no clubbing / cyanosis.  Skin: no rashes Neurologic: CN 2-12 grossly intact. Strength 5/5 in all 4.  Psychiatric: Normal judgment and insight. Alert and oriented x 3. Normal mood.   Labs on Admission: I have personally reviewed following labs and imaging studies  CBC: Recent Labs  Lab 07/03/18 1155  WBC 5.4  NEUTROABS 3.9  HGB 9.8*  HCT 32.9*  MCV 96.8  PLT 209   Basic Metabolic Panel: Recent Labs  Lab 07/01/18 1233 07/03/18 1120  NA 143 143  K 4.2 3.5  CL 100 104  CO2 27 31  GLUCOSE 172* 80  BUN 27 27*  CREATININE 1.98* 2.16*  CALCIUM 9.3 9.3   GFR: Estimated Creatinine Clearance: 30.8 mL/min (A) (by C-G formula based on SCr of 2.16 mg/dL (H)). Liver Function Tests: Recent Labs  Lab 07/03/18 1120  AST 40  ALT 59*  ALKPHOS 74  BILITOT 0.7  PROT 6.9  ALBUMIN 3.2*   No results for input(s): LIPASE, AMYLASE in the last 168 hours. No results for input(s): AMMONIA in the last 168 hours. Coagulation Profile: No results for input(s): INR, PROTIME in the last 168 hours. Cardiac Enzymes: No results for input(s): CKTOTAL, CKMB, CKMBINDEX, TROPONINI in the last 168 hours. BNP (last 3 results) Recent Labs    06/14/18 1222 07/01/18 1233  PROBNP 2,678* 5,013*   HbA1C: No results for input(s): HGBA1C in the last 72 hours. CBG: No results for input(s): GLUCAP in the last 168 hours. Lipid Profile: No results for input(s): CHOL, HDL, LDLCALC, TRIG, CHOLHDL, LDLDIRECT in the last 72 hours. Thyroid Function Tests: No results for input(s): TSH, T4TOTAL, FREET4, T3FREE, THYROIDAB in the last 72 hours. Anemia Panel: No results for input(s): VITAMINB12, FOLATE, FERRITIN, TIBC, IRON, RETICCTPCT in the last 72 hours.  Urine analysis: No results found for: COLORURINE, APPEARANCEUR, LABSPEC, PHURINE, GLUCOSEU, HGBUR, BILIRUBINUR, KETONESUR, PROTEINUR, UROBILINOGEN, NITRITE, LEUKOCYTESUR   Radiological Exams on Admission: Dg Chest 2 View  Result Date: 07/03/2018 CLINICAL DATA:  Shortness of breath and cough for 3 weeks EXAM: CHEST - 2 VIEW COMPARISON:  07/01/2018 FINDINGS: Cardiac shadow is again enlarged but stable. Small right pleural effusion is again noted with right basilar atelectasis which has improved slightly in the interval from the prior exam. Mild vascular congestion is noted. Bibasilar atelectatic changes are seen. IMPRESSION: Slight improved aeration in the right base although persistent small effusion is noted. Electronically Signed   By: Inez Catalina M.D.   On: 07/03/2018 12:15    EKG: Independently reviewed.  Sinus rhythm, intraventricular conduction delay  Assessment/Plan Active Problems:   Type 2 diabetes mellitus (HCC)   CKD (chronic kidney disease), stage III (HCC)   Chronic systolic CHF (congestive heart failure) (H[facility])   Essential hypertension   Pulmonary hypertension (HCC)   CHF exacerbation (HCC)   AKI (acute kidney injury) ([place])   Principal Problem Acute on chronic combined CHF /pulmonary hypertension -Patient will be admitted to telemetry, cardiology consulted.  Patient will be started on Lasix 80 mg twice daily, monitor strict I's and O's as well as daily weights.  Placed on sodium and fluid restriction.  Suspect dietary noncompliance plays a role as patient ate few crackers in the ER which amounted to about 20% of daily sodium intake, he was unaware of how much sodium was in dose and states that even at home may not check for sodium all the time -2D echo and cardiac catheter done recently, will not repeat, further work-up per cardiology  Active Problems Type 2 diabetes mellitus -Hold home oral hypoglycemics, placed on sliding scale  Hypertension -Continue home regimen  Coreg, hydralazine, Imdur  BPH -Continue home medications  Coronary artery disease -Recent cath shows mild nonobstructive CAD, he did complain of chest pain but likely in the setting of decompensated CHF  Normocytic anemia -Likely in the setting of  CKD, no bleeding   DVT prophylaxis: heparin  Code Status: Full code  Family Communication: no family at bedside  Disposition Plan: home when ready  Consults called: cardiology     Marzetta Board, MD, PhD [facility]  Contact via [email or web address]  [facility] Office Info P: [phone]  F: [phone]   07/03/2018, 5:03 PM

## 2018-07-03 NOTE — Progress Notes (Signed)
Pt is feeling SOB. Pt states " I would benefit from a breathing treatment. Last time I was here they gave me one and it helped". O2 is 96. MD notified. Will continue to monitor.

## 2018-07-03 NOTE — Progress Notes (Signed)
Pt refusing bed alarm.

## 2018-07-03 NOTE — Consult Note (Addendum)
Cardiology Consult    Patient ID: Allen Dyer MRN: [id number], DOB/AGE: 1936-03-22   Admit date: 07/03/2018 Date of Consult: 07/03/2018  Primary Physician: [name], MD Primary Cardiologist: [name], MD Requesting Provider: Coral Ceo, PA-C (Emergency Department)  Patient Profile    Allen Dyer is a 83 y.o. male with a history of mild to moderate CAD on cardiac catheterization in 05/2018, non-ischemic cardiomyopathy with EF of 30-35%, severe pulmonary hypertension with mean PA pressure on 47 mmHg on right cardiac catheterization in 05/2018, hypertension, hyperlipidemia, type 2 diabetes mellitus, rheumatic fever, peripheral neuropathy, CKD stage III, and prostate cancer s/p radiation who is being seen today for the evaluation of acute CHF at the request of Allen Couture, PA-C (Emergency Department).  History of Present Illness    Allen Dyer is a 83 year old male with the above history who is followed by Dr. Radford Pax. Patient initially presented to PCP in 04/2018 for evaluation of shortness of breath and lower extremity edema. BNP was elevated at 2,798 at that time and chest x-ray showed pulmonary interstitial edema and small bilateral posterior pleural pleural effusion. He was diagnosed with acute CHF and started on Lasix. Echocardiogram on 05/29/2018 showed LVEF of 30-35% with moderately dilated LV, grade 2 diastolic dysfunction, and moderate pulmonary hypertension with PASP of 59 mmHg. He was seen by Dr. Radford Pax on 06/14/2018 at which time he continued to report shortness of breath with episodes of PND and orthopnea. Decision was made to plan for outpatient right/left heart catheterization on 06/18/2018 which showed mild to moderate non-obstructive CAD with significant right heart pressure elevation and severe pulmonary hypertension with PA pressure of 72/27 (mean 47). Patient was admitted for IV diuresis and continued on home beta blocker and Hydralazine. ARB and Spironolactone were  unable to be added due to renal function. Patient was last seen by Allen Copa, PA-C, for follow-up on 07/01/2018 at which time he had a recurrence of his dyspnea on exertion, orthopnea, and lower extremity edema as well as a 7lb weight gain by his home scales. Lasix was increased from 40mg  to 80mg  twice daily.   Patient presented to the ED today for evaluation of continued shortness of breath. Patient reports continued dyspnea on exertion, orthopnea, PND, and lower extremity edema despite increase in Lasix. He also reports some associated chest tightness with his dyspnea on exertion. He notes some palpitations every now and then but denies any lightheadedness or dizziness. He has had a mild nonproductive cough and some nasal congestion but no fevers, chills, or body aches. No recent travel or sick contacts.   Upon arrival to the ED, O2 sats noted to be 89% on room air so patient was placed on 2L via nasal cannula. EKG showed normal sinus rhythm with no acute ischemic changes. I-stat troponin negative. BNP elevated at 1,606.3 (1,470 on 06/18/2018). Chest x-ray showed slight improved aeration in the right base but persistent small effusion noted. WBC 5.4, Hgb 9.8, Plts 194. Na 143, K 3.5, Glucose 80, SCr 2.16. Patient was given one dose of IV Lasix 40mg  in the ED with some mild improvement in symptoms.  Patient report remote smoking history but states he quit in 1973. He also reports remote alcohol use but none since 1977 and no recreational drug use. He does have a family history of heart disease with his father dying of heart failure at the age of 38 and his brother dying of heart failure at the age of 42. He has never had any  genetic testing for this.  Past Medical History   Past Medical History:  Diagnosis Date   Chronic combined systolic and diastolic CHF (congestive heart failure) (HCC)    CKD (chronic kidney disease), stage III (HCC)    Hyperlipidemia    Hypertension    Lower extremity edema      Mild CAD    a. mild-mod by cath 05/2018.   NICM (nonischemic cardiomyopathy) ([place])    Peripheral neuropathy    Prostate cancer ([place])    Status post XRT   Rheumatic fever    Trifascicular block    Type 2 diabetes mellitus ([place])     Past Surgical History:  Procedure Laterality Date   RIGHT/LEFT HEART CATH AND CORONARY ANGIOGRAPHY N/A 06/18/2018   Procedure: RIGHT/LEFT HEART CATH AND CORONARY ANGIOGRAPHY;  Surgeon: Troy Sine, MD;  Location: [facility];  Service: Cardiovascular;  Laterality: N/A;     Allergies  Allergies  Allergen Reactions   Tramadol Other (See Comments)    Dizziness (intolerance)    Finasteride Swelling    Breast enlargement    Lisinopril Cough   Ciprofloxacin Other (See Comments)    Dizziness (intolerance)    Simvastatin Hives and Other (See Comments)    Blisters/ Rash    Sulfa Antibiotics Rash    Inpatient Medications      Family History    Family History  Problem Relation Age of Onset   Congestive Heart Failure Father        died from heart failure at the age of 84   Cancer Mother    Cancer Sister    Congestive Heart Failure Brother        died from heart failure at the age of 47   He indicated that the status of his mother is unknown. He indicated that his father is deceased. He indicated that the status of his sister is unknown. He indicated that his brother is deceased.   Social History    Social History   Socioeconomic History   Marital status: Widowed    Spouse name: Not on file   Number of children: Not on file   Years of education: Not on file   Highest education level: Not on file  Occupational History   Not on file  Social Needs   Financial resource strain: Not on file   Food insecurity:    Worry: Not on file    Inability: Not on file   Transportation needs:    Medical: Not on file    Non-medical: Not on file  Tobacco Use   Smoking status: Never Smoker   Smokeless tobacco:  Never Used  Substance and Sexual Activity   Alcohol use: Never    Frequency: Never   Drug use: Never   Sexual activity: Not on file  Lifestyle   Physical activity:    Days per week: Not on file    Minutes per session: Not on file   Stress: Not on file  Relationships   Social connections:    Talks on phone: Not on file    Gets together: Not on file    Attends religious service: Not on file    Active member of club or organization: Not on file    Attends meetings of clubs or organizations: Not on file    Relationship status: Not on file   Intimate partner violence:    Fear of current or ex partner: Not on file    Emotionally abused: Not  on file    Physically abused: Not on file    Forced sexual activity: Not on file  Other Topics Concern   Not on file  Social History Narrative   Not on file     Review of Systems    Review of Systems  Constitutional: Negative for chills and fever.  HENT: Positive for congestion.   Respiratory: Positive for cough and shortness of breath. Negative for hemoptysis and sputum production.   Cardiovascular: Positive for chest pain, palpitations, orthopnea, leg swelling and PND.  Gastrointestinal: Negative for blood in stool, nausea and vomiting.  Genitourinary: Negative for hematuria.  Musculoskeletal: Negative for myalgias.  Neurological: Negative for dizziness and loss of consciousness.  Endo/Heme/Allergies: Does not bruise/bleed easily.  Psychiatric/Behavioral: Negative for substance abuse.    Physical Exam    Blood pressure (!) 152/98, pulse 94, temperature (!) 97.5 F (36.4 C), temperature source Oral, resp. rate (!) 28, height 5' 7.5" (1.715 m), weight 101.8 kg, SpO2 96 %.  General: 83 y.o. obese African-American male resting comfortably in no acute distress. Pleasant and cooperative. Currently on supplemental O2 via nasal cannula. HEENT: Normocephalic and atraumatic. EOMs intact. Sclera clear. No xanthomas. No cervical  lymphadenopathy.   Neck: Supple. No carotid bruits or JVD appreciated. Lungs: No increased work of breathing. Bibasilar crackles noted. No wheezes or rhonchi.  Heart: RRR. Distinct S1 and S2. Gallop present. No murmurs or rubs. Abdomen: Soft, mildly distended/obse, and non-tender to palpation. Bowel sounds present in all 4 quadrants.   Extremities: 2-3+ pitting edema of bilateral lower extremities. Radial pulses 2+ and equal bilaterally. Skin: Warm and dry. Hyperpigmentation noted on bilateral lower extremities. Neuro: Alert and oriented x3. No focal deficits. Moves all extremities spontaneously. Psych: Normal affect. Responds appropriately.  Labs    Troponin (Point of Care Test) Recent Labs    07/03/18 1140  TROPIPOC 0.04   No results for input(s): CKTOTAL, CKMB, TROPONINI in the last 72 hours. Lab Results  Component Value Date   WBC 5.4 07/03/2018   HGB 9.8 (L) 07/03/2018   HCT 32.9 (L) 07/03/2018   MCV 96.8 07/03/2018   PLT 194 07/03/2018    Recent Labs  Lab 07/03/18 1120  NA 143  K 3.5  CL 104  CO2 31  BUN 27*  CREATININE 2.16*  CALCIUM 9.3  PROT 6.9  BILITOT 0.7  ALKPHOS 74  ALT 59*  AST 40  GLUCOSE 80   No results found for: CHOL, HDL, LDLCALC, TRIG No results found for: [facility]   Radiology Studies    Dg Chest 2 View  Result Date: 07/03/2018 CLINICAL DATA:  Shortness of breath and cough for 3 weeks EXAM: CHEST - 2 VIEW COMPARISON:  07/01/2018 FINDINGS: Cardiac shadow is again enlarged but stable. Small right pleural effusion is again noted with right basilar atelectasis which has improved slightly in the interval from the prior exam. Mild vascular congestion is noted. Bibasilar atelectatic changes are seen. IMPRESSION: Slight improved aeration in the right base although persistent small effusion is noted. Electronically Signed   By: Inez Catalina M.D.   On: 07/03/2018 12:15   Dg Chest 2 View  Result Date: 07/01/2018 CLINICAL DATA:  History of recent pleural  effusion and CHF. Shortness of breath. EXAM: CHEST - 2 VIEW COMPARISON:  None. FINDINGS: The heart is enlarged. There is moderate tortuosity of the thoracic aorta. Moderate central vascular congestion without overt pulmonary edema. Very small right pleural effusion. Streaky bibasilar atelectasis and/or scarring changes. No obvious infiltrates.  Comparison to any prior studies would be extremely helpful. The bony thorax is intact. IMPRESSION: Cardiac enlargement and central vascular congestion without overt pulmonary edema. Small right pleural effusion and overlying atelectasis. No obvious infiltrates. Electronically Signed   By: Marijo Sanes M.D.   On: 07/01/2018 18:18    EKG     EKG: EKG was personally reviewed and demonstrates: Normal sinus rhythm, rate 75 bpm, with PVC, RBBB, LAFB, and 1st degree AV block but no acute ischemic changes compared to prior tracings.  Telemetry: Telemetry was personally reviewed and demonstrates: Sinus rhythm with heart rates in the 50's (sleeping) to 80's with frequent PVCs.  Cardiac Imaging    Echocardiogram 05/29/2018:  1. The left ventricle has moderate-severely reduced systolic function of 86-76%. The cavity size was moderately increased. There is mildly increased left ventricular wall thickness. Echo evidence of pseudonormalization in diastolic relaxation Elevated  mean left atrial pressure Left ventricular diffuse hypokinesis.  2. The right ventricle has normal systolic function. The cavity was normal. There is no increase in right ventricular wall thickness. Right ventricular systolic pressure is moderately elevated with an estimated pressure of 59.3 mmHg.  3. Left atrial size was moderately dilated.  4. Right atrial size was mildly dilated.  5. The mitral valve is normal in structure.  6. The tricuspid valve is normal in structure.  7. The aortic valve is tricuspid There is mild thickening of the aortic valve. Aortic valve regurgitation is mild by color flow  Doppler.  8. The pulmonic valve was normal in structure. Pulmonic valve regurgitation is mild by color flow Doppler.  9. There is mild dilatation of the ascending aorta. 10. Moderate to severe global reduction in LV systolic function; moderate diastolic dysfunction; moderate LVE; mild LVH; mildly dilated ascending aorta; biatrial enlargement; mild AI, MR and TR; moderate pulmonary hypertension. _______________  Right/Left Heart Catheterization 06/18/2018:  Prox RCA lesion is 30% stenosed.  Dist RCA-1 lesion is 20% stenosed.  Dist RCA-2 lesion is 20% stenosed.  Prox Cx lesion is 10% stenosed.  Dist LM to Ost LAD lesion is 20% stenosed.  Prox LAD lesion is 50% stenosed.  Prox LAD to Mid LAD lesion is 40% stenosed.   Significant right heart pressure elevation with severe pulmonary hypertension with a PA pressure of 72/27, mean 47.  Mild to moderate nonobstructive disease with mild proximal calcification of the LAD with 20% narrowing, 50% smooth narrowing after the diagonal vessel with 40% mid stenosis and a large wraparound LAD; mild 10% narrowing in the proximal circumflex; and mild irregularity of the RCA with 30% proximal stenosis and segmental 20% distal stenoses.  Findings consistent with a nonischemic cardiomyopathy.  RECOMMENDATION: The patient was short of breath at the completion of the procedure and received Lasix 40 mg at the completion of the procedure with prompt diuresis of over 450 cc prior to leaving the laboratory.  Will initially be monitored in the holding area to assess oxygenation and additional diuresis.  The patient had held his Lasix for 2 days prior to the procedure which undoubtedly contributed to his significant right heart pressure elevation.  Guideline directed medical therapy for his cardiomyopathy will be necessary.  Medical therapy initially for his CAD.  Initiation of lipid-lowering therapy.  Optimal blood pressure control with ideal blood pressure less  than 120/80.  Patient will follow-up with Dr. Golden Hurter.  Assessment & Plan    Acute on Chronic Combined CHF / Non-Ischemic Cardiomyopathy - Patient presents with worsening dyspnea on exertion, orthopnea,  PND, and lower extremity edema despite increase in home Lasix.  - Recent Echo on 05/29/2018 showed LVEF of 30-35% with moderately dilated LV, grade 2 diastolic dysfunction, and moderate pulmonary hypertension with PASP of 59 mmHg.  - Recent right/left heart catheterization on 06/18/2018 showed mild to moderate non-obstructive CAD with with significant right heart pressure elevation and severe pulmonary hypertension with PA pressure of 72/27 (mean 47). - Patient given one dose of IV Lasix 40mg  in the ED with some urine output. Will give another dose of 40mg  tonight and then start 80mg  twice daily tomorrow. - Potassium 3.5 on admission. Will order K-Dur 53meq twice daily. Will also check Magnesium given frequent ectopy on telemetry. - Continue home Coreg 6.25mg  twice daily.  - Continue home Hydralazine 10mg  three times daily. - Continue home Imdur 15mg  daily. - Unable to start ACEi/ARB/Spironolacton due to renal function. - Continue to monitor daily weights, strict I/O's, and renal function.  Of note, patient reports his father died from heart failure at the age of 85 and his brother died from heart failure at the age of 66. Patient does have biological children. Would recommend genetic testing for them.  Pulmonary Hypertension - Right heart catheterization as above. - Sleep study ordered at last office visit. - Will likely need reassessment of pulmonary pressures once patient is well-diuresed.  Trifascicular Block - EKG shows RBBB, LAFB, and 1st degree AV blocker.  - Will need to use caution when up-titrating beta-blocker. - Monitor for bradycardia.  CAD - Mild to moderate disease on cardiac catheterization on 06/18/2018. - Continue aggressive primary prevention with home medications.    Hypertension - Most recent BP 152/98. Patient has not had afternoon dose of Hydralazine. - Continue home medications: Hydralazine 10mg  three times daily, Coreg 6.25mg  twice daily, and Imdur 15mg  twice daily.  Hyperlipidemia - Patient was recently prescribed Atorvastatin but has not started it yet because he had a true allergic reaction with Simvastatin (hives and blistering). Allen Copa, PA-C, discussed with Pharmacist at last office visit who said we could consider trying something that was different structurally, such as low dose Crestor. Pharmacist did not feel like Zetia monotherapy would have the same CV benefits in the absence of statin therapy. Could also consider PCSK9 down the road. Will defer to MD.  Type 2 Diabetes Mellitus - On Metformin 500mg  twice daily at home. - Management per primary team.  Acute on CKD Stage III - Serum creatinine 2.16 on admission, up from 1.98 two days ago. Baseline appears to be around 1.5 to 1.7.  - Will continue to monitor closely with diuresis.   Anemia - Hemoglobin 9.8 on admission. Baseline appears to be around 10 to 11. - Patient denies any abnormal bleeding including hemoptysis, hematochezia, and hematuria. - Management per primary team.   Signed, Allen Mclean, PA-C 07/03/2018, 5:00 PM  For questions or updates, please contact   Please consult [email or web address] for contact info under Cardiology/STEMI. ---------------------------------------------------------------------------------------------   History and all data above reviewed.  Patient examined.  I agree with the findings as above.  Allen Dyer is a pleasant 83 yo male with heart failure.  He feels that his dry weight is 210 pounds, and prior to admission his weight was 227 pounds.  He feels that his weight was increasing after hospital discharge steadily.  He feels short of breath and has significant lower extremity edema.  He notes that he had to sleep in a kneeling position  yesterday due to shortness of breath.  He had contacted the office and his Lasix was increased, however he continued to accumulate fluid, and outpatient cardiology team had a suspicion that he may require inpatient diuresis.  Constitutional: No acute distress Cardiovascular: regular rhythm, normal rate, no murmurs. S1 and S2 split likely 2/2 to RBBB. Radial pulses normal bilaterally. No jugular venous distention.  Respiratory: Bilateral crackles in the bases GI : normal bowel sounds, soft and nontender. No distention.   MSK: extremities warm, well perfused.  2+ pitting edema to the knee.  NEURO: grossly nonfocal exam, moves all extremities. PSYCH: alert and oriented x 3, normal mood and affect.   All available labs, radiology testing, previous records reviewed. Agree with documented assessment and plan of my colleague as stated above with the following additions or changes:  Active Problems:   Type 2 diabetes mellitus (HCC)   CKD (chronic kidney disease), stage III (HCC)   Chronic systolic CHF (congestive heart failure) (HCC)   Essential hypertension   Pulmonary hypertension (HCC)   CHF exacerbation (HCC)   AKI (acute kidney injury) ([place])    Plan: He has clear acute on chronic systolic and diastolic heart failure.  This will require inpatient diuresis for several days.  Strictly monitor ins and outs, and continue IV Lasix.  With his renal dysfunction this may be secondary to renal congestion, will give 80 mg of IV Lasix twice daily for adequate response.  Continue home meds: Hydralazine 10mg  three times daily, Coreg 6.25mg  twice daily, and Imdur 15mg  twice daily. Consider addition of doxazosin for added BP control if needed.  Length of Stay:  LOS: 0 days   Elouise Munroe, MD [facility] 5:16 PM  07/03/2018

## 2018-07-03 NOTE — Progress Notes (Signed)
Pt states "I cannot lay down in my bed. I feel like something is stuck in my throat and I am still SOB". RN gave breathing treatment an hour ago. PT vitals are stable. Pt O2 is 95 on 3L. MD notified. Will continue to monitor pt.

## 2018-07-03 NOTE — ED Notes (Signed)
ED TO INPATIENT HANDOFF REPORT  ED Nurse Name and Phone #: [phone] Darci Current Name/Age/Gender Allen Dyer 83 y.o. male Room/Bed: 043C/043C  Code Status   Code Status: Prior  Home/SNF/Other Home Patient oriented to: self, place, time and situation Is this baseline? Yes   Triage Complete: Triage complete  Chief Complaint sob  Triage Note Pt arrives from Dr. Jerrilyn Cairo for eval of [facility] and was noted as 89%sat on room air. Pt seen by Cardiologist Monday and told fluid around right lung with lasix increased. Pt state increased swelling and SHOB over last 2 days . Sleeping in chair.O@ noted as 89-90. Placed on 2l/[place]   Allergies Allergies  Allergen Reactions  . Tramadol Other (See Comments)    Dizziness (intolerance)   . Finasteride Swelling    Breast enlargement   . Lisinopril Cough  . Ciprofloxacin Other (See Comments)    Dizziness (intolerance)   . Simvastatin Hives and Other (See Comments)    Blisters/ Rash   . Sulfa Antibiotics Rash    Level of Care/Admitting Diagnosis ED Disposition    ED Disposition Condition Comment   Admit  Hospital Area: [place] [[id number]]  Level of Care: Telemetry Cardiac [103]  I expect the patient will be discharged within 24 hours: No (not a candidate for 5C-Observation unit)  Diagnosis: CHF exacerbation [facility]) [[id number]]  Admitting Physician: Caren Griffins [phone]  Attending Physician: Caren Griffins [[id number]]  PT Class (Do Not Modify): Observation [104]  PT Acc Code (Do Not Modify): Observation [10022]       B Medical/Surgery History Past Medical History:  Diagnosis Date  . Chronic combined systolic and diastolic CHF (congestive heart failure) ([place])   . CKD (chronic kidney disease), stage III ([place])   . Hyperlipidemia   . Hypertension   . Lower extremity edema   . Mild CAD    a. mild-mod by cath 05/2018.  Marland Kitchen NICM (nonischemic cardiomyopathy) ([place])   . Peripheral neuropathy   . Prostate cancer ([place])     Status post XRT  . Rheumatic fever   . Trifascicular block   . Type 2 diabetes mellitus ([place])    Past Surgical History:  Procedure Laterality Date  . RIGHT/LEFT HEART CATH AND CORONARY ANGIOGRAPHY N/A 06/18/2018   Procedure: RIGHT/LEFT HEART CATH AND CORONARY ANGIOGRAPHY;  Surgeon: Troy Sine, MD;  Location: [place] CV [facility];  Service: Cardiovascular;  Laterality: N/A;     A IV Location/Drains/Wounds Patient Lines/Drains/Airways Status   Active Line/Drains/Airways    None          Intake/Output Last 24 hours  Intake/Output Summary (Last 24 hours) at 07/03/2018 1806 Last data filed at 07/03/2018 1723 Gross per 24 hour  Intake -  Output 800 ml  Net -800 ml    Labs/Imaging Results for orders placed or performed during the hospital encounter of 07/03/18 (from the past 48 hour(s))  Comprehensive metabolic panel     Status: Abnormal   Collection Time: 07/03/18 11:20 AM  Result Value Ref Range   Sodium 143 135 - 145 mmol/L   Potassium 3.5 3.5 - 5.1 mmol/L   Chloride 104 98 - 111 mmol/L   CO2 31 22 - 32 mmol/L   Glucose, Bld 80 70 - 99 mg/dL   BUN 27 (H) 8 - 23 mg/dL   Creatinine, Ser 2.16 (H) 0.61 - 1.24 mg/dL   Calcium 9.3 8.9 - 10.3 mg/dL   Total Protein 6.9 6.5 - 8.1 g/dL  Albumin 3.2 (L) 3.5 - 5.0 g/dL   AST 40 15 - 41 U/L   ALT 59 (H) 0 - 44 U/L   Alkaline Phosphatase 74 38 - 126 U/L   Total Bilirubin 0.7 0.3 - 1.2 mg/dL   GFR calc non Af Amer 28 (L) >60 mL/min   GFR calc Af Amer 32 (L) >60 mL/min   Anion gap 8 5 - 15    Comment: Performed at [facility] [address] [place], [place] [address]  I-Stat Troponin, ED (not at [facility])     Status: None   Collection Time: 07/03/18 11:40 AM  Result Value Ref Range   Troponin i, poc 0.04 0.00 - 0.08 ng/mL   Comment 3            Comment: Due to the release kinetics of cTnI, a negative result within the first hours of the onset of symptoms does not rule out myocardial infarction with certainty. If  myocardial infarction is still suspected, repeat the test at appropriate intervals.   Brain natriuretic peptide     Status: Abnormal   Collection Time: 07/03/18 11:55 AM  Result Value Ref Range   B Natriuretic Peptide 1,606.3 (H) 0.0 - 100.0 pg/mL    Comment: Performed at [facility] [address] [place], [place] [address]  CBC with Differential/Platelet     Status: Abnormal   Collection Time: 07/03/18 11:55 AM  Result Value Ref Range   WBC 5.4 4.0 - 10.5 K/uL   RBC 3.40 (L) 4.22 - 5.81 MIL/uL   Hemoglobin 9.8 (L) 13.0 - 17.0 g/dL   HCT 32.9 (L) 39.0 - 52.0 %   MCV 96.8 80.0 - 100.0 fL   MCH 28.8 26.0 - 34.0 pg   MCHC 29.8 (L) 30.0 - 36.0 g/dL   RDW 16.1 (H) 11.5 - 15.5 %   Platelets 194 150 - 400 K/uL   nRBC 0.0 0.0 - 0.2 %   Neutrophils Relative % 72 %   Neutro Abs 3.9 1.7 - 7.7 K/uL   Lymphocytes Relative 15 %   Lymphs Abs 0.8 0.7 - 4.0 K/uL   Monocytes Relative 11 %   Monocytes Absolute 0.6 0.1 - 1.0 K/uL   Eosinophils Relative 2 %   Eosinophils Absolute 0.1 0.0 - 0.5 K/uL   Basophils Relative 0 %   Basophils Absolute 0.0 0.0 - 0.1 K/uL   Immature Granulocytes 0 %   Abs Immature Granulocytes 0.01 0.00 - 0.07 K/uL    Comment: Performed at [facility], [place] [address] [place], [place] [address]   Dg Chest 2 View  Result Date: 07/03/2018 CLINICAL DATA:  Shortness of breath and cough for 3 weeks EXAM: CHEST - 2 VIEW COMPARISON:  07/01/2018 FINDINGS: Cardiac shadow is again enlarged but stable. Small right pleural effusion is again noted with right basilar atelectasis which has improved slightly in the interval from the prior exam. Mild vascular congestion is noted. Bibasilar atelectatic changes are seen. IMPRESSION: Slight improved aeration in the right base although persistent small effusion is noted. Electronically Signed   By: Inez Catalina M.D.   On: 07/03/2018 12:15    Pending Labs Unresulted Labs (From admission, onward)    Start     Ordered    07/04/18 1287  Basic metabolic panel  Tomorrow morning,   R     07/03/18 1655   07/03/18 1656  Magnesium  Once,   R     07/03/18 1655  07/03/18 1115  CBC with Differential  ONCE - STAT,   STAT     07/03/18 1115   Signed and Held  CBC  (heparin)  Once,   R    Comments:  Baseline for heparin therapy IF NOT ALREADY DRAWN.  Notify MD if PLT < 100 K.    Signed and Held   Signed and Held  Creatinine, serum  (heparin)  Once,   R    Comments:  Baseline for heparin therapy IF NOT ALREADY DRAWN.    Signed and Held   Signed and Held  Basic metabolic panel  Daily,   R     Signed and Held          Vitals/Pain Today's Vitals   07/03/18 1215 07/03/18 1246 07/03/18 1250 07/03/18 1300  BP: (!) 140/94   (!) 152/98  Pulse: (!) 57   94  Resp: 20   (!) 28  Temp:      TempSrc:      SpO2: 97%   96%  Weight:  101.8 kg    Height:      PainSc:   0-No pain     Isolation Precautions No active isolations  Medications Medications  furosemide (LASIX) injection 80 mg (has no administration in time range)  potassium chloride SA (K-DUR,KLOR-CON) CR tablet 20 mEq (has no administration in time range)  furosemide (LASIX) injection 40 mg (has no administration in time range)  furosemide (LASIX) injection 40 mg (40 mg Intravenous Given 07/03/18 1247)    Mobility walks with person assist Low fall risk   Focused Assessments Pulmonary Assessment Handoff:  Lung sounds:   O2 Device: Nasal Cannula O2 Flow Rate (L/min): 2 L/min      R Recommendations: See Admitting Provider Note  Report given to:   Additional Notes:

## 2018-07-03 NOTE — ED Provider Notes (Signed)
[facility] EMERGENCY DEPARTMENT Provider Note   CSN: [id number] Arrival date & time: 07/03/18  1042    History   Chief Complaint Chief Complaint  Patient presents with   Shortness of Breath    HPI Allen Dyer is a 83 y.o. male.     HPI   Patient is an 83 year old male with a history of systolic and diastolic heart failure, CKD, HLD, hypertension, diabetes, who presents the emergency department today for evaluation of shortness of breath.  Patient states for the last several days he has had worsening shortness of breath, or dyspnea on exertion and orthopnea.  States he has not been able to sleep for the last several days because he cannot breathe when he lays flat.  He was recently admitted in late February for acute heart failure.  Was discharged on 40 mg Lasix twice daily.  Was seen by cardiology 2 days ago and Lasix was increased from 40 mg BID to 80 BID.  He states he has been adherent to his Lasix however has had continued worsening of his symptoms.  Has had increase in his bilateral lower extremity edema.  States he has gained 10 pounds since he was discharged.  He has been adherent to a low-salt diet.  He was seen by his PCP prior to arrival and was noted to have O2 sats at 89% on room air in the office and was therefore sent here for further evaluation.  He denies any chest pain.  Cardiology: Dr. Radford Pax  Past Medical History:  Diagnosis Date   Chronic combined systolic and diastolic CHF (congestive heart failure) (H[facility])    CKD (chronic kidney disease), stage III (HCC)    Hyperlipidemia    Hypertension    Lower extremity edema    Mild CAD    a. mild-mod by cath 05/2018.   NICM (nonischemic cardiomyopathy) ([place])    Peripheral neuropathy    Prostate cancer ([place])    Status post XRT   Rheumatic fever    Trifascicular block    Type 2 diabetes mellitus [facility])     Patient Active Problem List   Diagnosis Date Noted   CHF exacerbation ([place])  07/03/2018   AKI (acute kidney injury) ([place]) 07/03/2018   Acute CHF (congestive heart failure) ([place]) 06/18/2018   NICM (nonischemic cardiomyopathy) ([place])    Pulmonary hypertension ([place])    Essential hypertension 06/14/2018   Type 2 diabetes mellitus (H[facility])    Prostate cancer (HCC)    Rheumatic fever    Peripheral neuropathy    Lower extremity edema    CKD (chronic kidney disease), stage III (HCC)    Hyperlipidemia    Chronic systolic CHF (congestive heart failure) ([place])     Past Surgical History:  Procedure Laterality Date   RIGHT/LEFT HEART CATH AND CORONARY ANGIOGRAPHY N/A 06/18/2018   Procedure: RIGHT/LEFT HEART CATH AND CORONARY ANGIOGRAPHY;  Surgeon: Troy Sine, MD;  Location: [place] [facility];  Service: Cardiovascular;  Laterality: N/A;        Home Medications    Prior to Admission medications   Medication Sig Start Date End Date Taking? Authorizing Provider  aspirin EC 81 MG tablet Take 81 mg by mouth every evening.     [provider]  carvedilol (COREG) 6.25 MG tablet Take 1 tablet (6.25 mg total) by mouth 2 (two) times daily. 06/14/18 06/14/19  [name], MD  Cholecalciferol (VITAMIN D3 PO) Take 1 tablet by mouth daily.     [provider]  Cyanocobalamin (B-12 PO) Take 1 tablet by mouth daily.     [provider]  furosemide (LASIX) 40 MG tablet Take 2 tablets (80 mg total) by mouth 2 (two) times daily. 07/01/18 09/29/18  Dunn, Nedra Hai, PA-C  gabapentin (NEURONTIN) 800 MG tablet Take 800 mg by mouth 2 (two) times daily.     [provider]  glipiZIDE (GLUCOTROL) 10 MG tablet Take 10 mg by mouth 2 (two) times daily.     [provider]  hydrALAZINE (APRESOLINE) 10 MG tablet Take 1 tablet (10 mg total) by mouth 3 (three) times daily. 06/17/18   [name], MD  isosorbide mononitrate (IMDUR) 30 MG 24 hr tablet 1/2 tablet (15 mg) by mouth daily 06/17/18   [name], MD  metFORMIN (GLUCOPHAGE) 500 MG  tablet Take 500 mg by mouth 2 (two) times daily with a meal.     [provider]  Multiple Vitamin (MULTIVITAMIN WITH MINERALS) TABS tablet Take 1 tablet by mouth 2 (two) times daily.    [provider]  oxybutynin (DITROPAN-XL) 5 MG 24 hr tablet Take 5 mg by mouth daily.     [provider]  potassium chloride (K-DUR) 10 MEQ tablet Take 10 mEq by mouth daily.  08/19/15   [provider]  PRECISION XTRA TEST STRIPS test strip  03/10/17   [provider]  tamsulosin (FLOMAX) 0.4 MG CAPS capsule Take 0.4 mg by mouth 2 (two) times daily.  03/24/17   [provider]    Family History Family History  Problem Relation Age of Onset   Congestive Heart Failure Father        died from heart failure at the age of 82   Cancer Mother    Cancer Sister    Congestive Heart Failure Brother        died from heart failure at the age of 28    Social History Social History   Tobacco Use   Smoking status: Never Smoker   Smokeless tobacco: Never Used  Substance Use Topics   Alcohol use: Never    Frequency: Never   Drug use: Never     Allergies   Tramadol; Finasteride; Lisinopril; Ciprofloxacin; Simvastatin; and Sulfa antibiotics   Review of Systems Review of Systems  Constitutional: Negative for chills and fever.  HENT: Negative for ear pain and sore throat.   Eyes: Negative for visual disturbance.  Respiratory: Positive for shortness of breath. Negative for cough.        DOE, orthopnea  Cardiovascular: Positive for leg swelling. Negative for chest pain.  Gastrointestinal: Negative for abdominal pain, constipation, diarrhea, nausea and vomiting.  Genitourinary: Negative for decreased urine volume, dysuria and hematuria.  Musculoskeletal: Negative for back pain.  Skin: Negative for color change and rash.  Neurological: Negative for headaches.  All other systems reviewed and are negative.    Physical Exam Updated Vital Signs BP  (!) 152/98    Pulse 94    Temp (!) 97.5 F (36.4 C) (Oral)    Resp (!) 28    Ht 5' 7.5" (1.715 m)    Wt 101.8 kg    SpO2 96%    BMI 34.64 kg/m   Physical Exam Vitals signs and nursing note reviewed.  Constitutional:      General: He is not in acute distress.    Appearance: He is well-developed.  HENT:     Head: Normocephalic and atraumatic.  Eyes:  Conjunctiva/sclera: Conjunctivae normal.  Neck:     Musculoskeletal: Neck supple.  Cardiovascular:     Rate and Rhythm: Normal rate and regular rhythm.     Heart sounds: Normal heart sounds. No murmur.  Pulmonary:     Effort: Pulmonary effort is normal. No respiratory distress.     Breath sounds: Examination of the right-middle field reveals rales. Examination of the left-middle field reveals rales. Examination of the right-lower field reveals rales. Examination of the left-lower field reveals rales. Decreased breath sounds and rales present. No wheezing.     Comments: Speaking in full sentences Abdominal:     Palpations: Abdomen is soft.     Tenderness: There is no abdominal tenderness.  Musculoskeletal:     Comments: BLE 2-3+ pitting edema up to the thighs.  Skin:    General: Skin is warm and dry.  Neurological:     Mental Status: He is alert.  Psychiatric:        Mood and Affect: Mood normal.      ED Treatments / Results  Labs (all labs ordered are listed, but only abnormal results are displayed) Labs Reviewed  COMPREHENSIVE METABOLIC PANEL - Abnormal; Notable for the following components:      Result Value   BUN 27 (*)    Creatinine, Ser 2.16 (*)    Albumin 3.2 (*)    ALT 59 (*)    GFR calc non Af Amer 28 (*)    GFR calc Af Amer 32 (*)    All other components within normal limits  BRAIN NATRIURETIC PEPTIDE - Abnormal; Notable for the following components:   B Natriuretic Peptide 1,606.3 (*)    All other components within normal limits  CBC WITH DIFFERENTIAL/PLATELET - Abnormal; Notable for the following  components:   RBC 3.40 (*)    Hemoglobin 9.8 (*)    HCT 32.9 (*)    MCHC 29.8 (*)    RDW 16.1 (*)    All other components within normal limits  CBC WITH DIFFERENTIAL/PLATELET  MAGNESIUM  BASIC METABOLIC PANEL  I-STAT TROPONIN, ED    EKG EKG Interpretation  Date/Time:  Wednesday July 03 2018 10:57:59 EDT Ventricular Rate:  75 PR Interval:    QRS Duration: 166 QT Interval:  423 QTC Calculation: 473 R Axis:   -83 Text Interpretation:  Sinus rhythm Ventricular premature complex Prolonged PR interval RBBB and LAFB occasional PVC overall similar to previous Confirmed by Theotis Burrow [phone]) on 07/03/2018 11:02:29 AM Also confirmed by Theotis Burrow [phone]), editor Philomena Doheny [phone])  on 07/03/2018 1:29:29 PM   Radiology Dg Chest 2 View  Result Date: 07/03/2018 CLINICAL DATA:  Shortness of breath and cough for 3 weeks EXAM: CHEST - 2 VIEW COMPARISON:  07/01/2018 FINDINGS: Cardiac shadow is again enlarged but stable. Small right pleural effusion is again noted with right basilar atelectasis which has improved slightly in the interval from the prior exam. Mild vascular congestion is noted. Bibasilar atelectatic changes are seen. IMPRESSION: Slight improved aeration in the right base although persistent small effusion is noted. Electronically Signed   By: Inez Catalina M.D.   On: 07/03/2018 12:15    Procedures Procedures (including critical care time)  Medications Ordered in ED Medications  furosemide (LASIX) injection 80 mg (has no administration in time range)  potassium chloride SA (K-DUR,KLOR-CON) CR tablet 20 mEq (has no administration in time range)  furosemide (LASIX) injection 40 mg (has no administration in time range)  furosemide (LASIX) injection 40 mg (40 mg  Intravenous Given 07/03/18 1247)     Initial Impression / Assessment and Plan / ED Course  I have reviewed the triage vital signs and the nursing notes.  Pertinent labs & imaging results that were available  during my care of the patient were reviewed by me and considered in my medical decision making (see chart for details).       Final Clinical Impressions(s) / ED Diagnoses   Final diagnoses:  Acute on chronic congestive heart failure, unspecified heart failure type ([place])  Hypoxia   Patient with a history of CHF presenting to the emergency department today for evaluation of worsening as of breath, dyspnea on exertion, orthopnea and bilateral lower extremity edema.  He was sent to the ED for further evaluation after being noted to be hypoxic with O2 sat of 89% by his PCP prior to arrival.  On arrival patient is hypoxic with sats at 88 to 90% on room air.  Otherwise his vital signs are within normal limits.  O2 sats increased to the low 90s with 2 L.  CBC with no leukocytosis. Anemia noted with hgb 9.8, slightly lower than his baseline of 10.5-11.5. CMP with elevated BUN and worsening creatinine now 2.16, up from 1.98. ALT also slightly elevated at 59. BNP elevated at 1600 Trop negative  EKG with NSR, PVCs, Prolonged PR interval RBBB and LAFB, overall similar to previous   CXR with persistent small effusion in the right base with slightly improved aeration.   2:06 PM Contacted lab. They did not receive label to run BNP. They will run this now.   Pt with h/o CHF with worsening peripheral edema and persistent right pleural effusion now with O2 requirement. Also with worsening aki on ckd. He took his 80 mg lasix this AM And he was given 40mg  IV in the ED.   2:16 PM CONSULT with Dr. Radford Pax with cardiology who will see the patient.   4:00 PM Cardiology at bedside.  4:40 PM Cardiology will consult on pt. Dr. Margaretann Loveless recommends admission to hospitalist service  Discussed case with Dr. Cruzita Lederer who accepts pt for admission.   ED Discharge Orders    None       Bishop Dublin 07/03/18 1732    Little, Wenda Overland, MD 07/07/18 2105

## 2018-07-04 ENCOUNTER — Observation Stay (HOSPITAL_COMMUNITY): Payer: Medicare Other

## 2018-07-04 ENCOUNTER — Telehealth: Payer: Self-pay | Admitting: *Deleted

## 2018-07-04 DIAGNOSIS — I5021 Acute systolic (congestive) heart failure: Secondary | ICD-10-CM | POA: Diagnosis present

## 2018-07-04 DIAGNOSIS — Z882 Allergy status to sulfonamides status: Secondary | ICD-10-CM | POA: Diagnosis not present

## 2018-07-04 DIAGNOSIS — I2510 Atherosclerotic heart disease of native coronary artery without angina pectoris: Secondary | ICD-10-CM | POA: Diagnosis present

## 2018-07-04 DIAGNOSIS — Z8249 Family history of ischemic heart disease and other diseases of the circulatory system: Secondary | ICD-10-CM | POA: Diagnosis not present

## 2018-07-04 DIAGNOSIS — Z881 Allergy status to other antibiotic agents status: Secondary | ICD-10-CM | POA: Diagnosis not present

## 2018-07-04 DIAGNOSIS — N400 Enlarged prostate without lower urinary tract symptoms: Secondary | ICD-10-CM | POA: Diagnosis present

## 2018-07-04 DIAGNOSIS — E1110 Type 2 diabetes mellitus with ketoacidosis without coma: Secondary | ICD-10-CM | POA: Diagnosis not present

## 2018-07-04 DIAGNOSIS — Z7982 Long term (current) use of aspirin: Secondary | ICD-10-CM | POA: Diagnosis not present

## 2018-07-04 DIAGNOSIS — I509 Heart failure, unspecified: Secondary | ICD-10-CM

## 2018-07-04 DIAGNOSIS — N179 Acute kidney failure, unspecified: Secondary | ICD-10-CM | POA: Diagnosis present

## 2018-07-04 DIAGNOSIS — I453 Trifascicular block: Secondary | ICD-10-CM | POA: Diagnosis present

## 2018-07-04 DIAGNOSIS — R0902 Hypoxemia: Secondary | ICD-10-CM | POA: Diagnosis not present

## 2018-07-04 DIAGNOSIS — Z8546 Personal history of malignant neoplasm of prostate: Secondary | ICD-10-CM | POA: Diagnosis not present

## 2018-07-04 DIAGNOSIS — I130 Hypertensive heart and chronic kidney disease with heart failure and stage 1 through stage 4 chronic kidney disease, or unspecified chronic kidney disease: Secondary | ICD-10-CM | POA: Diagnosis present

## 2018-07-04 DIAGNOSIS — R0602 Shortness of breath: Secondary | ICD-10-CM

## 2018-07-04 DIAGNOSIS — I5043 Acute on chronic combined systolic (congestive) and diastolic (congestive) heart failure: Secondary | ICD-10-CM | POA: Diagnosis not present

## 2018-07-04 DIAGNOSIS — J9601 Acute respiratory failure with hypoxia: Secondary | ICD-10-CM | POA: Diagnosis present

## 2018-07-04 DIAGNOSIS — Z923 Personal history of irradiation: Secondary | ICD-10-CM | POA: Diagnosis not present

## 2018-07-04 DIAGNOSIS — Z79899 Other long term (current) drug therapy: Secondary | ICD-10-CM | POA: Diagnosis not present

## 2018-07-04 DIAGNOSIS — I428 Other cardiomyopathies: Secondary | ICD-10-CM | POA: Diagnosis present

## 2018-07-04 DIAGNOSIS — N183 Chronic kidney disease, stage 3 (moderate): Secondary | ICD-10-CM | POA: Diagnosis present

## 2018-07-04 DIAGNOSIS — Z7984 Long term (current) use of oral hypoglycemic drugs: Secondary | ICD-10-CM | POA: Diagnosis not present

## 2018-07-04 DIAGNOSIS — Z885 Allergy status to narcotic agent status: Secondary | ICD-10-CM | POA: Diagnosis not present

## 2018-07-04 DIAGNOSIS — E785 Hyperlipidemia, unspecified: Secondary | ICD-10-CM | POA: Diagnosis present

## 2018-07-04 DIAGNOSIS — Z888 Allergy status to other drugs, medicaments and biological substances status: Secondary | ICD-10-CM | POA: Diagnosis not present

## 2018-07-04 DIAGNOSIS — I272 Pulmonary hypertension, unspecified: Secondary | ICD-10-CM | POA: Diagnosis present

## 2018-07-04 DIAGNOSIS — D631 Anemia in chronic kidney disease: Secondary | ICD-10-CM | POA: Diagnosis present

## 2018-07-04 DIAGNOSIS — I10 Essential (primary) hypertension: Secondary | ICD-10-CM | POA: Diagnosis not present

## 2018-07-04 DIAGNOSIS — G629 Polyneuropathy, unspecified: Secondary | ICD-10-CM | POA: Diagnosis present

## 2018-07-04 DIAGNOSIS — I5022 Chronic systolic (congestive) heart failure: Secondary | ICD-10-CM | POA: Diagnosis not present

## 2018-07-04 DIAGNOSIS — E1122 Type 2 diabetes mellitus with diabetic chronic kidney disease: Secondary | ICD-10-CM | POA: Diagnosis present

## 2018-07-04 LAB — BASIC METABOLIC PANEL
Anion gap: 10 (ref 5–15)
BUN: 25 mg/dL — ABNORMAL HIGH (ref 8–23)
CO2: 30 mmol/L (ref 22–32)
Calcium: 9.3 mg/dL (ref 8.9–10.3)
Chloride: 104 mmol/L (ref 98–111)
Creatinine, Ser: 2.1 mg/dL — ABNORMAL HIGH (ref 0.61–1.24)
GFR calc Af Amer: 33 mL/min — ABNORMAL LOW (ref >60)
GFR calc non Af Amer: 29 mL/min — ABNORMAL LOW (ref >60)
Glucose, Bld: 162 mg/dL — ABNORMAL HIGH (ref 70–99)
Potassium: 4 mmol/L (ref 3.5–5.1)
Sodium: 144 mmol/L (ref 135–145)

## 2018-07-04 LAB — GLUCOSE, CAPILLARY
Glucose-Capillary: 128 mg/dL — ABNORMAL HIGH (ref 70–99)
Glucose-Capillary: 160 mg/dL — ABNORMAL HIGH (ref 70–99)
Glucose-Capillary: 141 mg/dL — ABNORMAL HIGH (ref 70–99)
Glucose-Capillary: 131 mg/dL — ABNORMAL HIGH (ref 70–99)

## 2018-07-04 MED ORDER — GUAIFENESIN-DM 100-10 MG/5ML PO SYRP
5.0000 mL | ORAL_SOLUTION | ORAL | Status: DC | PRN
  Administered 2018-07-05: 5 mL via ORAL
  Filled 2018-07-04 (×2): qty 5

## 2018-07-04 MED ORDER — IPRATROPIUM-ALBUTEROL 0.5-2.5 (3) MG/3ML IN SOLN
3.0000 mL | Freq: Four times a day (QID) | RESPIRATORY_TRACT | Status: DC
  Administered 2018-07-04 – 2018-07-08 (×14): 3 mL via RESPIRATORY_TRACT
  Filled 2018-07-04 (×17): qty 3

## 2018-07-04 MED ORDER — HYDRALAZINE HCL 20 MG/ML IJ SOLN: 10.0000 mg | INTRAMUSCULAR | Status: DC | PRN

## 2018-07-04 MED ORDER — FUROSEMIDE 10 MG/ML IJ SOLN
40.0000 mg | Freq: Once | INTRAMUSCULAR | Status: AC
  Administered 2018-07-04: 40 mg via INTRAVENOUS

## 2018-07-04 MED ORDER — DIPHENHYDRAMINE HCL 25 MG PO CAPS
25.0000 mg | ORAL_CAPSULE | Freq: Every evening | ORAL | Status: DC | PRN
  Administered 2018-07-06 (×2): 25 mg via ORAL
  Filled 2018-07-04 (×4): qty 1

## 2018-07-04 MED ORDER — GUAIFENESIN-DM 100-10 MG/5ML PO SYRP
ORAL_SOLUTION | ORAL | Status: AC
  Administered 2018-07-04: 5 mL
  Filled 2018-07-04: qty 10

## 2018-07-04 NOTE — Telephone Encounter (Signed)
-----   Message from Jeanann Lewandowsky, [place] sent at 07/01/2018 12:12 PM EDT ----- Regarding: sleep study PT NEEDS A SLEEP STUDY   THANKS

## 2018-07-04 NOTE — Progress Notes (Signed)
SATURATION QUALIFICATIONS: (This note is used to comply with regulatory documentation for home oxygen)  Patient Saturations on Room Air at Rest = 87%  Patient Saturations on Room Air while Ambulating = 81%  Patient Saturations on 4 Liters of oxygen while Ambulating = 91%  Please briefly explain why patient needs home oxygen: Pt currently requires supplemental oxygen to maintain SpO2 >/88% with mobility.  Mabeline Caras, PT, DPT Acute Rehabilitation Services  Pager [phone] Office [phone]

## 2018-07-04 NOTE — Progress Notes (Signed)
Called by RN to assess patient.  Patient sitting in chair. 2L O2 spo2 88%. Pt complains of SOB. Chest feels tight.  Non productive cough.  Increased Fio2 to 6lpm [place].  spo2 is 94%. Spoke with [facility] in regards to patient. Re-assessed with RN and [facility] at bedside.

## 2018-07-04 NOTE — Progress Notes (Signed)
Pt has been sitting on the side of the bed for the past couple of hours stating he cannot lay down because he wont be able to breath. Pt states that he has been coughing for the past three weeks. Assessed lung sounds. Rn found that at 5pm there was a missing dose of Lasixs that was not given. [facility] called. Respiratory called. MD notified.    [facility] came to assess, MD ordered portable chest x-ray. One time dose of 40mg  Lasixs, Robitussin ordered. Will continue to monitor pt.

## 2018-07-04 NOTE — Evaluation (Signed)
Physical Therapy Evaluation Patient Details Name: Allen Dyer MRN: [id number] DOB: 1935/11/13 Today's Date: 07/04/2018   History of Present Illness  Pt is an 83 y.o. male admitted 07/03/18 with SOB and swelling. Worked up for CHF and pulmonary hypertension. Of note, recent admission with heart cath 2/25 showing mild nonobstructive CAD. PMH includes HTN, DM2, CAD.    Clinical Impression  Pt presents with an overall decrease in functional mobility secondary to above. PTA, pt indep, remains active and lives with supportive family. Today, pt ambulatory without device at supervision-level; required 4L O2 [place] to maintain SpO2 >90% with mobility (see saturations qualifications note). Discussed energy conservation strategies and mobility recommendations with HF. Pt would benefit from continued acute PT services to maximize functional mobility and independence prior to d/c home.     Follow Up Recommendations No PT follow up;Supervision - Intermittent    Equipment Recommendations  None recommended by PT    Recommendations for Other Services       Precautions / Restrictions Precautions Precautions: Fall Precaution Comments: Currently requiring 4L O2 [place] to maintain SpO2 >90% (not on O2 baseline) Restrictions Weight Bearing Restrictions: No      Mobility  Bed Mobility Overal bed mobility: Modified Independent                Transfers Overall transfer level: Needs assistance Equipment used: None Transfers: Sit to/from Stand Sit to Stand: Supervision            Ambulation/Gait Ambulation/Gait assistance: Supervision Gait Distance (Feet): 400 Feet Assistive device: None Gait Pattern/deviations: Step-through pattern;Decreased stride length Gait velocity: Decreased Gait velocity interpretation: 1.31 - 2.62 ft/sec, indicative of limited community ambulator General Gait Details: Slow, steady gait without device; supervision for safety. SpO2 down to 81% on RA, maintaining >90% on  4L O2 [place]  Stairs            Wheelchair Mobility    Modified Rankin (Stroke Patients Only)       Balance Overall balance assessment: Needs assistance   Sitting balance-Leahy Scale: Good       Standing balance-Leahy Scale: Good                               Pertinent Vitals/Pain Pain Assessment: No/denies pain    Home Living Family/patient expects to be discharged to:: Private residence Living Arrangements: Children;Other relatives Available Help at Discharge: Family;Available 24 hours/day Type of Home: House Home Access: Stairs to enter Entrance Stairs-Rails: Right Entrance Stairs-Number of Steps: 5 Home Layout: Two level;1/2 bath on main level;Bed/bath upstairs Home Equipment: None Additional Comments: Lives with daughter's family    Prior Function Level of Independence: Independent         Comments: Independent without device. Drives, although has not been driving the past month for unspecified reasons; family able to provide transportation. Enjoys walking     Hand Dominance        Extremity/Trunk Assessment   Upper Extremity Assessment Upper Extremity Assessment: Overall WFL for tasks assessed    Lower Extremity Assessment Lower Extremity Assessment: Overall WFL for tasks assessed    Cervical / Trunk Assessment Cervical / Trunk Assessment: Normal  Communication   Communication: No difficulties  Cognition Arousal/Alertness: Awake/alert Behavior During Therapy: WFL for tasks assessed/performed Overall Cognitive Status: Within Functional Limits for tasks assessed  General Comments      Exercises     Assessment/Plan    PT Assessment Patient needs continued PT services  PT Problem List Decreased strength;Decreased activity tolerance;Decreased balance;Decreased mobility;Cardiopulmonary status limiting activity       PT Treatment Interventions DME instruction;Gait  training;Stair training;Functional mobility training;Therapeutic activities;Therapeutic exercise;Balance training;Patient/family education    PT Goals (Current goals can be found in the Care Plan section)  Acute Rehab PT Goals Patient Stated Goal: Improved breathing; "I am not going home until I'm ready this time" PT Goal Formulation: With patient Time For Goal Achievement: 07/18/18 Potential to Achieve Goals: Good    Frequency Min 3X/week   Barriers to discharge        Co-evaluation               AM-PAC PT "6 Clicks" Mobility  Outcome Measure Help needed turning from your back to your side while in a flat bed without using bedrails?: None Help needed moving from lying on your back to sitting on the side of a flat bed without using bedrails?: None Help needed moving to and from a bed to a chair (including a wheelchair)?: None Help needed standing up from a chair using your arms (e.g., wheelchair or bedside chair)?: None Help needed to walk in hospital room?: A Little Help needed climbing 3-5 steps with a railing? : A Little 6 Click Score: 22    End of Session Equipment Utilized During Treatment: Gait belt;Oxygen Activity Tolerance: Patient tolerated treatment well Patient left: in bed;with call bell/phone within reach(seated EOB) Nurse Communication: Mobility status PT Visit Diagnosis: Other abnormalities of gait and mobility (R26.89)    Time: 1287-8676 PT Time Calculation (min) (ACUTE ONLY): 27 min   Charges:   PT Evaluation $PT Eval Moderate Complexity: 1 Mod PT Treatments $Gait Training: 8-22 mins      Mabeline Caras, PT, DPT Acute Rehabilitation Services  Pager [phone] Office [place] 07/04/2018, 3:06 PM

## 2018-07-04 NOTE — Significant Event (Signed)
Rapid Response Event Note  Overview: Respiratory   Initial Focused Assessment: Called by RN to assess patient for shortness of breath. Per nurse, this has been ongoing through out the night. Upon arrival, patient was sitting up in chair, mild labored breathing at times, + dry non productive cough. Bibasilar coarse crackles, + 2 edema in BLE, + 1 edema in BUE, mild JVD. RT at the bedside as well. Oxygen saturations 94-98% on 6L [place]  Interventions: -- STAT CXR -- Lasix 40 mg IV  -- Robitussin   Plan of Care: -- Monitor respiratory status   Event Summary:    at    Call Time 0426 Arrival Time 0429 End Time 0510  Wendy Hoback R

## 2018-07-04 NOTE — Progress Notes (Addendum)
Pt refused heparin last night and this morning. Pt states he is on aspirin. Pt educated on medication and daughter and pt still want to speak with MD.

## 2018-07-04 NOTE — Progress Notes (Signed)
Progress Note  Patient Name: Allen Dyer Date of Encounter: 07/04/2018  Primary Cardiologist: [name], MD   Subjective   Feels he is improving but still needs further diuresis. Had chest pressure and SOB overnight.   Inpatient Medications    Scheduled Meds: . carvedilol  6.25 mg Oral BID  . furosemide  40 mg Intravenous Once  . furosemide  80 mg Intravenous BID  . heparin  5,000 Units Subcutaneous Q8H  . hydrALAZINE  10 mg Oral TID  . insulin aspart  0-9 Units Subcutaneous TID WC  . ipratropium-albuterol  3 mL Nebulization QID  . isosorbide mononitrate  15 mg Oral Daily  . oxybutynin  5 mg Oral Daily  . potassium chloride  20 mEq Oral BID  . tamsulosin  0.4 mg Oral BID   Continuous Infusions:  PRN Meds: albuterol, diphenhydrAMINE, guaiFENesin-dextromethorphan, hydrALAZINE   Vital Signs    Vitals:   07/04/18 0640 07/04/18 1034 07/04/18 1220 07/04/18 1453  BP:  (!) 158/105 (!) 146/88   Pulse:  65 85   Resp:  20 20   Temp:   97.8 F (36.6 C)   TempSrc: Oral  Oral   SpO2:  100% 100% 98%  Weight:      Height:        Intake/Output Summary (Last 24 hours) at 07/04/2018 1720 Last data filed at 07/04/2018 1543 Gross per 24 hour  Intake 240 ml  Output 3001 ml  Net -2761 ml   Filed Weights   07/03/18 1115 07/03/18 1246 07/04/18 0410  Weight: 103 kg 101.8 kg 100.9 kg    Telemetry     occasional ectopy- Personally Reviewed  ECG    No new  Physical Exam   GEN: No acute distress.   Neck: JVD to mid 1/3 of neck Cardiac: regular rhythm, normal rate, no murmurs, rubs, or gallops.  Respiratory: bibasilar crackles GI: Soft, nontender, non-distended  MS: 2+ pitting edema to knee, mildly improved; No deformity. Neuro:  Nonfocal  Psych: Normal affect   Labs    Chemistry Recent Labs  Lab 07/01/18 1233 07/03/18 1120 07/03/18 1958 07/04/18 0548  NA 143 143  --  144  K 4.2 3.5  --  4.0  CL 100 104  --  104  CO2 27 31  --  30  GLUCOSE 172* 80  --   162*  BUN 27 27*  --  25*  CREATININE 1.98* 2.16* 2.08* 2.10*  CALCIUM 9.3 9.3  --  9.3  PROT  --  6.9  --   --   ALBUMIN  --  3.2*  --   --   AST  --  40  --   --   ALT  --  59*  --   --   ALKPHOS  --  74  --   --   BILITOT  --  0.7  --   --   GFRNONAA 31* 28* 29* 29*  GFRAA 36* 32* 34* 33*  ANIONGAP  --  8  --  10     Hematology Recent Labs  Lab 07/03/18 1155 07/03/18 1958  WBC 5.4 5.5  RBC 3.40* 3.46*  HGB 9.8* 10.1*  HCT 32.9* 33.5*  MCV 96.8 96.8  MCH 28.8 29.2  MCHC 29.8* 30.1  RDW 16.1* 16.2*  PLT 194 198    Cardiac EnzymesNo results for input(s): TROPONINI in the last 168 hours.  Recent Labs  Lab 07/03/18 1140  TROPIPOC 0.04     BNP  Recent Labs  Lab 07/01/18 1233 07/03/18 1155  BNP  --  1,606.3*  PROBNP 5,013*  --      DDimer No results for input(s): DDIMER in the last 168 hours.   Radiology    Dg Chest 2 View  Result Date: 07/03/2018 CLINICAL DATA:  Shortness of breath and cough for 3 weeks EXAM: CHEST - 2 VIEW COMPARISON:  07/01/2018 FINDINGS: Cardiac shadow is again enlarged but stable. Small right pleural effusion is again noted with right basilar atelectasis which has improved slightly in the interval from the prior exam. Mild vascular congestion is noted. Bibasilar atelectatic changes are seen. IMPRESSION: Slight improved aeration in the right base although persistent small effusion is noted. Electronically Signed   By: Inez Catalina M.D.   On: 07/03/2018 12:15   Dg Chest Port 1 View  Result Date: 07/04/2018 CLINICAL DATA:  Shortness of breath EXAM: PORTABLE CHEST 1 VIEW COMPARISON:  Yesterday FINDINGS: Cardiomegaly and vascular pedicle widening. Diffuse interstitial opacity with symmetric hazy airspace opacity at the bases. There is likely pleural effusions. No pneumothorax. IMPRESSION: CHF pattern Electronically Signed   By: Monte Fantasia M.D.   On: 07/04/2018 06:31    Cardiac Studies     Patient Profile       Assessment & Plan    Active Problems:   Type 2 diabetes mellitus (HCC)   CKD (chronic kidney disease), stage III (HCC)   Chronic systolic CHF (congestive heart failure) (HCC)   Essential hypertension   Pulmonary hypertension (HCC)   CHF exacerbation (HCC)   AKI (acute kidney injury) ([place])   Acute on chronic congestive heart failure (HCC)   Continue lasix 80 mg IV BID, he is at 100 kg and dry weight is approximately 95 kg (210 lb).   Can consider uptitration of antihypertensive therapy.   AKI - hopefully will improve with diuresis. Continue to monitor.  Consider genetic screening as outpatient for nonischemic CM for family.    Consider PCSK9 inhibitor for hyperlipidemia as outpatient, workup has been initiated with cv clinic.     For questions or updates, please contact [place] Please consult [email or web address] for contact info under        Signed, Elouise Munroe, MD  07/04/2018, 5:20 PM

## 2018-07-04 NOTE — Progress Notes (Addendum)
Progress Note    Fadel Clason  ZOX:[id number] DOB: 01-11-1936  DOA: 07/03/2018 PCP: [name], MD    Brief Narrative:   Chief complaint: Shortness of breath and swelling  Medical records reviewed and are as summarized below:  Ly Bacchi is an 83 y.o. male with past medical history significant for chronic combined systolic and diastolic CHF, rheumatic fever, DM type II, HTN, HLD, prostate cancer s/p XRT, and CKD stage III; who presents with complaints of worsening shortness of breath over the last 3 weeks despite cardiology increasing Lasix to 80 mg twice daily recently.  Assessment/Plan:   Active Problems:   Type 2 diabetes mellitus (HCC)   CKD (chronic kidney disease), stage III (HCC)   Chronic systolic CHF (congestive heart failure) (HCC)   Essential hypertension   Pulmonary hypertension (HCC)   CHF exacerbation (HCC)   AKI (acute kidney injury) ([place]) Acute on chronic combined CHF /pulmonary hypertension: BNP in 1606 and previously 2678 during on last admission in February.  Chest x-ray showing slightly improved aeration in right lung base although still persistent small effusion noted with enlarged cardiac shadow.  No overt edema noted.  Suspect dietary noncompliance plays a role as patient ate few crackers in the ER which amounted to about 20% of daily sodium intake, he was unaware of how much sodium was in dose and states that even at home may not check for sodium all the time.  Echocardiogram in 05/2018 performed showing EF of 30-35%.  Patient underwent right heart cath during that admission as well showing nonischemic cardiomyopathy and pulmonary hypertension.  Telemetry overnight revealed intermittent PVCs and first-degree AV block.  Patient appears net -1402 L of fluid and weight down approximately 2 pounds.   -Monitor strict I's and O's and daily weights.   -Placed on sodium and fluid restriction.   -Lasix 80 mg twice daily -Appreciate cardiology consultative  services will follow for further recommendation  Acute on chronic kidney disease: Baseline creatinine previously noted to be around 1.5-1.7.  Patient creatinine currently trending upwards to 2.1 with diuresis. -Continue daily monitoring of kidney function  Type 2 diabetes mellitus:Blood sugars appear relatively well controlled -Hypoglycemia protocol -Hold home oral hypoglycemics -Continue sliding scale insulin  Respiratory failure with hypoxia: Patient on 2 L of nasal cannula oxygen at this time as O2 saturations noted to drop as low as 88%. -Wean oxygen to off as tolerated -Scheduled breathing treatments  Hypertension: Uncontrolled.  Blood pressures elevated at 158/105 -Continue home regimen Coreg, hydralazine, Imdur -IV hydralazine as needed for elevated blood pressures  Coronary artery disease: Recent cath shows mild nonobstructive CAD, he did complain of chest pain, but likely in the setting of decompensated CHF  Hyperlipidemia: Patient not taking atorvastatin due to previously reported allergic reaction with simvastatin. -Question trial of Crestor  Normocytic anemia: Chronic.  Hemoglobin 10.1 which appears near patient's baseline.  He does have increased RDW suggesting iron deficiency anemia.  BPH, history of prostate cancer: Patient s/p XRT  -Continue home medications of Flomax  Body mass index is 34.64 kg/m.   Family Communication/Anticipated D/C date and plan/Code Status   DVT prophylaxis: Lovenox ordered. Code Status: Full Code.  Family Communication: No family present at bedside at this time. Disposition Plan: Likely discharge home in 2 to 3 days   Medical Consultants:    Cardiology.   Anti-Infectives:    None  Subjective:   Patient reports still feeling very short of breath with any kind of exertion despite increases  in Lasix.  Objective:    Vitals:   07/03/18 2240 07/03/18 2259 07/04/18 0021 07/04/18 0330  BP:  (!) 146/96 (!) 144/97   Pulse:   70 74   Resp:  18 20   Temp:   97.8 F (36.6 C)   TempSrc:   Oral   SpO2: 96% 95% 96% 99%  Weight:      Height:        Intake/Output Summary (Last 24 hours) at 07/04/2018 0553 Last data filed at 07/03/2018 2330 Gross per 24 hour  Intake -  Output 1101 ml  Net -1101 ml   Filed Weights   07/03/18 1115 07/03/18 1246  Weight: 103 kg 101.8 kg    Exam: Constitutional: Elderly male NAD, calm, comfortable Eyes: PERRL, lids and conjunctivae normal ENMT: Mucous membranes are moist. Posterior pharynx clear of any exudate or lesions.  Neck: normal, supple, no masses, no thyromegaly,+ JVD Respiratory: Decreased overall air movement with positive expiratory wheeze.  Patient on 3 L nasal cannula oxygen at this time and talking in short sentences. Cardiovascular: Regular rate and rhythm, no murmurs / rubs / gallops. +2 pitting lower extremity edema extremity edema. 2+ pedal pulses. No carotid bruits.  Abdomen: no tenderness, no masses palpated. No hepatosplenomegaly. Bowel sounds positive.  Musculoskeletal: no clubbing / cyanosis. No joint deformity upper and lower extremities. Good ROM, no contractures. Normal muscle tone.  Skin: no rashes, lesions, ulcers. No induration Neurologic: CN 2-12 grossly intact. Sensation intact, DTR normal. Strength 5/5 in all 4.  Psychiatric: Normal judgment and insight. Alert and oriented x 3. Normal mood.    Data Reviewed:   I have personally reviewed following labs and imaging studies:  Labs: Labs show the following:   Basic Metabolic Panel: Recent Labs  Lab 07/01/18 1233 07/03/18 1120 07/03/18 1958  NA 143 143  --   K 4.2 3.5  --   CL 100 104  --   CO2 27 31  --   GLUCOSE 172* 80  --   BUN 27 27*  --   CREATININE 1.98* 2.16* 2.08*  CALCIUM 9.3 9.3  --   MG  --   --  2.2   GFR Estimated Creatinine Clearance: 32 mL/min (A) (by C-G formula based on SCr of 2.08 mg/dL (H)). Liver Function Tests: Recent Labs  Lab 07/03/18 1120  AST 40  ALT  59*  ALKPHOS 74  BILITOT 0.7  PROT 6.9  ALBUMIN 3.2*   No results for input(s): LIPASE, AMYLASE in the last 168 hours. No results for input(s): AMMONIA in the last 168 hours. Coagulation profile No results for input(s): INR, PROTIME in the last 168 hours.  CBC: Recent Labs  Lab 07/03/18 1155 07/03/18 1958  WBC 5.4 5.5  NEUTROABS 3.9  --   HGB 9.8* 10.1*  HCT 32.9* 33.5*  MCV 96.8 96.8  PLT 194 198   Cardiac Enzymes: No results for input(s): CKTOTAL, CKMB, CKMBINDEX, TROPONINI in the last 168 hours. BNP (last 3 results) Recent Labs    06/14/18 1222 07/01/18 1233  PROBNP 2,678* 5,013*   CBG: Recent Labs  Lab 07/03/18 2137  GLUCAP 127*   D-Dimer: No results for input(s): DDIMER in the last 72 hours. Hgb A1c: No results for input(s): HGBA1C in the last 72 hours. Lipid Profile: No results for input(s): CHOL, HDL, LDLCALC, TRIG, CHOLHDL, LDLDIRECT in the last 72 hours. Thyroid function studies: No results for input(s): TSH, T4TOTAL, T3FREE, THYROIDAB in the last 72 hours.  Invalid input(s):  FREET3 Anemia work up: No results for input(s): VITAMINB12, FOLATE, FERRITIN, TIBC, IRON, RETICCTPCT in the last 72 hours. Sepsis Labs: Recent Labs  Lab 07/03/18 1155 07/03/18 1958  WBC 5.4 5.5    Microbiology No results found for this or any previous visit (from the past 240 hour(s)).  Procedures and diagnostic studies:  Dg Chest 2 View  Result Date: 07/03/2018 CLINICAL DATA:  Shortness of breath and cough for 3 weeks EXAM: CHEST - 2 VIEW COMPARISON:  07/01/2018 FINDINGS: Cardiac shadow is again enlarged but stable. Small right pleural effusion is again noted with right basilar atelectasis which has improved slightly in the interval from the prior exam. Mild vascular congestion is noted. Bibasilar atelectatic changes are seen. IMPRESSION: Slight improved aeration in the right base although persistent small effusion is noted. Electronically Signed   By: Inez Catalina M.D.    On: 07/03/2018 12:15    Medications:   . carvedilol  6.25 mg Oral BID  . furosemide  40 mg Intravenous Once  . furosemide  80 mg Intravenous BID  . heparin  5,000 Units Subcutaneous Q8H  . hydrALAZINE  10 mg Oral TID  . insulin aspart  0-9 Units Subcutaneous TID WC  . isosorbide mononitrate  15 mg Oral Daily  . oxybutynin  5 mg Oral Daily  . potassium chloride  20 mEq Oral BID  . tamsulosin  0.4 mg Oral BID   Continuous Infusions:   LOS: 0 days   Aveyah Greenwood A Brycin Kille  [facility]   *Please refer to [email or web address], password [email or web address] to get updated schedule on who will round on this patient, as hospitalists switch teams weekly. If 7PM-7AM, please contact night-coverage at [email or web address], password [email or web address] for any overnight needs.

## 2018-07-04 NOTE — Progress Notes (Signed)
Pt states "breathing treatments only help me for two minutes and then I go back to not being able to breath. It still feels like something is in my throat". Respiratory notified. Second breathing treatment given. Will continue to monitor pt

## 2018-07-04 NOTE — Telephone Encounter (Signed)
Staff message sent to [name] no PA is required. Patient has Medicare. Ok to schedule sleep study.

## 2018-07-04 NOTE — Care Management Obs Status (Signed)
[facility] NOTIFICATION   Patient Details  Name: Allen Dyer MRN: [id number] Date of Birth: 24-Mar-1936   Medicare Observation Status Notification Given:  Yes    Royston Bake, RN 07/04/2018, 3:24 PM

## 2018-07-05 DIAGNOSIS — I5022 Chronic systolic (congestive) heart failure: Secondary | ICD-10-CM

## 2018-07-05 LAB — BASIC METABOLIC PANEL
Anion gap: 12 (ref 5–15)
BUN: 23 mg/dL (ref 8–23)
CO2: 33 mmol/L — ABNORMAL HIGH (ref 22–32)
Calcium: 9.4 mg/dL (ref 8.9–10.3)
Chloride: 98 mmol/L (ref 98–111)
Creatinine, Ser: 1.99 mg/dL — ABNORMAL HIGH (ref 0.61–1.24)
GFR calc Af Amer: 35 mL/min — ABNORMAL LOW (ref >60)
GFR calc non Af Amer: 31 mL/min — ABNORMAL LOW (ref >60)
Glucose, Bld: 125 mg/dL — ABNORMAL HIGH (ref 70–99)
Potassium: 3.7 mmol/L (ref 3.5–5.1)
Sodium: 143 mmol/L (ref 135–145)

## 2018-07-05 LAB — GLUCOSE, CAPILLARY
Glucose-Capillary: 163 mg/dL — ABNORMAL HIGH (ref 70–99)
Glucose-Capillary: 129 mg/dL — ABNORMAL HIGH (ref 70–99)
Glucose-Capillary: 174 mg/dL — ABNORMAL HIGH (ref 70–99)
Glucose-Capillary: 139 mg/dL — ABNORMAL HIGH (ref 70–99)

## 2018-07-05 MED ORDER — HYDRALAZINE HCL 25 MG PO TABS
25.0000 mg | ORAL_TABLET | Freq: Three times a day (TID) | ORAL | Status: DC
  Administered 2018-07-05 – 2018-07-10 (×16): 25 mg via ORAL
  Filled 2018-07-05 (×16): qty 1

## 2018-07-05 NOTE — Progress Notes (Signed)
PROGRESS NOTE    Allen Dyer  QZR:[id number] DOB: February 12, 1936 DOA: 07/03/2018 PCP: [name], MD    Brief Narrative:  83 year old male who presented with dyspnea and edema.  He does have significant past medical history for chronic systolic heart failure, type 2 diabetes mellitus, hypertension, dyslipidemia, and chronic kidney disease stage III.  He had a recent hospitalization for heart failure decompensation.  He reports worsening dyspnea for the last 2 weeks, associated with worsening edema and 7 pound weight gain.  He was seen at the outpatient cardiology clinic, his furosemide dose was increased without improvement of his symptoms.  On his initial physical examination his blood pressure was 140/94, heart rate 94, respiratory rate 28, oxygen saturation 96%, on supplemental oxygen, he had moist mucous membranes, lungs with bibasilar rales, increase inspiratory effort, heart S1-S2 present and rhythmic, abdomen soft nontender, positive pitting bilateral lower extremity edema.  Sodium 143, potassium 3.5, chloride 104, bicarb 31, glucose 80, BUN 27, creatinine 2.1, BNP 1606, white count 5.4, hemoglobin 9.8, creatinine 2.9, platelets 194.  His chest radiograph had cardiomegaly, bilateral interstitial infiltrates, vascular congestion and bilateral small pleural effusions.  His EKG was sinus rhythm with a first-degree AV block, rate 75 bpm, left axis with left anterior fascicular block and right bundle branch block, no significant ST segment or T wave changes.  Patient was admitted to the hospital with the working diagnosis of acute decompensated systolic heart failure.   Assessment & Plan:   Active Problems:   Type 2 diabetes mellitus (HCC)   CKD (chronic kidney disease), stage III (HCC)   Chronic systolic CHF (congestive heart failure) (HCC)   Essential hypertension   Pulmonary hypertension (HCC)   CHF exacerbation (HCC)   AKI (acute kidney injury) ([place])   Acute on chronic congestive heart  failure ([place])   1. Acute decompensated systolic heart failure. Will continue diuresis with IV furosemide, due to persistent hypervolemia. Urine output over last 24 H is 3,200 ml. Stable blood pressure 137/76. Continue heart failure management with carvedilol and after load reduction with hydralazine and isosorbide. Nutrition consult for heart failure teaching.   2. AKI on CKD stage 3. Renal function with serum cr at 1,9 down from 2,10, K at 3,7 and serum bicarbonate at 33. Will continue aggressive diuresis with IV furosemide 80 mg IV q12. Follow on renal panel in am, avoid hypotension and nephrotoxic medications.   3. HTN. Continue blood pressure control with hydralazine and isosorbide.   4. T2DM. Continue glucose cover and monitoring with insulin sliding scale. Patient is tolerating po well, fasting glucose 125 mg/dl.   DVT prophylaxis: heparin   Code Status:  full Family Communication: no family at the  bedside Disposition Plan/ discharge barriers: pending clinical improvement   Body mass index is 33.49 kg/m. Malnutrition Type:      Malnutrition Characteristics:      Nutrition Interventions:     RN Pressure Injury Documentation:     Consultants:   Cardiology   Procedures:     Antimicrobials:       Subjective: Patient is feeling better but not yet back to baseline, continue to have dyspnea and lower extremity edema. No chest pain, no nausea or vomiting. At home has not been following a strict sodium and fluid restriction.   Objective: Vitals:   07/04/18 2144 07/05/18 0239 07/05/18 0544 07/05/18 0902  BP: (!) 143/85  (!) 153/97 (!) 140/93  Pulse:   75 83  Resp:   20 18  Temp:  98.9 F (37.2 C)   TempSrc:   Oral   SpO2:   95% 96%  Weight:  98.4 kg    Height:        Intake/Output Summary (Last 24 hours) at 07/05/2018 1222 Last data filed at 07/05/2018 9735 Gross per 24 hour  Intake 720 ml  Output 2600 ml  Net -1880 ml   Filed Weights   07/03/18  1246 07/04/18 0410 07/05/18 0239  Weight: 101.8 kg 100.9 kg 98.4 kg    Examination:   General: Not in pain or dyspnea, deconditioned  Neurology: Awake and alert, non focal  E ENT: no pallor, no icterus, oral mucosa moist Cardiovascular: No JVD. S1-S2 present, rhythmic, no gallops, rubs, or murmurs. ++/+++ pitting lower extremity edema. Pulmonary: positive breath sounds bilaterally, decreased breath sounds at bases, no wheezing, rhonchi or rales. Gastrointestinal. Abdomen with, no organomegaly, non tender, no rebound or guarding Skin. No rashes Musculoskeletal: no joint deformities     Data Reviewed: I have personally reviewed following labs and imaging studies  CBC: Recent Labs  Lab 07/03/18 1155 07/03/18 1958  WBC 5.4 5.5  NEUTROABS 3.9  --   HGB 9.8* 10.1*  HCT 32.9* 33.5*  MCV 96.8 96.8  PLT 194 329   Basic Metabolic Panel: Recent Labs  Lab 07/01/18 1233 07/03/18 1120 07/03/18 1958 07/04/18 0548 07/05/18 0514  NA 143 143  --  144 143  K 4.2 3.5  --  4.0 3.7  CL 100 104  --  104 98  CO2 27 31  --  30 33*  GLUCOSE 172* 80  --  162* 125*  BUN 27 27*  --  25* 23  CREATININE 1.98* 2.16* 2.08* 2.10* 1.99*  CALCIUM 9.3 9.3  --  9.3 9.4  MG  --   --  2.2  --   --    GFR: Estimated Creatinine Clearance: 32.8 mL/min (A) (by C-G formula based on SCr of 1.99 mg/dL (H)). Liver Function Tests: Recent Labs  Lab 07/03/18 1120  AST 40  ALT 59*  ALKPHOS 74  BILITOT 0.7  PROT 6.9  ALBUMIN 3.2*   No results for input(s): LIPASE, AMYLASE in the last 168 hours. No results for input(s): AMMONIA in the last 168 hours. Coagulation Profile: No results for input(s): INR, PROTIME in the last 168 hours. Cardiac Enzymes: No results for input(s): CKTOTAL, CKMB, CKMBINDEX, TROPONINI in the last 168 hours. BNP (last 3 results) Recent Labs    06/14/18 1222 07/01/18 1233  PROBNP 2,678* 5,013*   HbA1C: No results for input(s): HGBA1C in the last 72 hours. CBG: Recent  Labs  Lab 07/04/18 1131 07/04/18 1624 07/04/18 2149 07/05/18 0550 07/05/18 1201  GLUCAP 160* 141* 131* 163* 129*   Lipid Profile: No results for input(s): CHOL, HDL, LDLCALC, TRIG, CHOLHDL, LDLDIRECT in the last 72 hours. Thyroid Function Tests: No results for input(s): TSH, T4TOTAL, FREET4, T3FREE, THYROIDAB in the last 72 hours. Anemia Panel: No results for input(s): VITAMINB12, FOLATE, FERRITIN, TIBC, IRON, RETICCTPCT in the last 72 hours.    Radiology Studies: I have reviewed all of the imaging during this hospital visit personally     Scheduled Meds: . carvedilol  6.25 mg Oral BID  . furosemide  40 mg Intravenous Once  . furosemide  80 mg Intravenous BID  . heparin  5,000 Units Subcutaneous Q8H  . hydrALAZINE  25 mg Oral TID  . insulin aspart  0-9 Units Subcutaneous TID WC  . ipratropium-albuterol  3 mL Nebulization  QID  . isosorbide mononitrate  15 mg Oral Daily  . oxybutynin  5 mg Oral Daily  . tamsulosin  0.4 mg Oral BID   Continuous Infusions:   LOS: 1 day        Mauricio Gerome Apley, MD

## 2018-07-05 NOTE — Plan of Care (Signed)
Nutrition Education Note  RD consult for <2 g Sodium/day; Heart Failure Education.   Handouts given to the Pt were provided from [facility] Nutrition Care Manual and included:  "Low Sodium Nutrition Therapy."   Reviewed diet recall daughter does most of the cooking, some fresh items like a meat, starch, and vegetables. Pt states he loves fresh steamed vegetables. Other items included prepared meal plans, like blue apron. Some items pt was able to identify sodium was in fried foods and salt shaker. Points discussed from education was low sodium being 140 mg. Used bag of chips to indicate nutrition facts label and how to look for sodium. Emphasized the importance of weight monitoring and daily weighing to know if fluid weight was increasing. Teach back method used; pt was able to recall what a low sodium food was verse a reduced sodium food item. Pt also stated that he will be more focused on weighing more often. He was aware of needing to weigh, but would only do it every couple of days.   Pt expected compliance is good. Pt states that himself, daughter, and son in law will all benefit and need to go on sodium restriction diet.   Body mass index is 33.49 kg/m. BMI indicates Obese I.    Pt is on 2 gram Sodium restriction Diet. Pt meal completion is 50-75% per chart and pt reporting.   Labs and medications reviewed. No further nutrition interventions at this time. If further nutrition needs arise, please consult RD.   Herma Carson, [place] Dietetic Intern

## 2018-07-05 NOTE — Progress Notes (Addendum)
Progress Note  Patient Name: Allen Dyer Date of Encounter: 07/05/2018  Primary Cardiologist: [name], MD   Subjective   Pt still with lower extremity edema and on supplemental O2.  Inpatient Medications    Scheduled Meds: . carvedilol  6.25 mg Oral BID  . furosemide  40 mg Intravenous Once  . furosemide  80 mg Intravenous BID  . heparin  5,000 Units Subcutaneous Q8H  . hydrALAZINE  10 mg Oral TID  . insulin aspart  0-9 Units Subcutaneous TID WC  . ipratropium-albuterol  3 mL Nebulization QID  . isosorbide mononitrate  15 mg Oral Daily  . oxybutynin  5 mg Oral Daily  . tamsulosin  0.4 mg Oral BID   Continuous Infusions:  PRN Meds: albuterol, diphenhydrAMINE, guaiFENesin-dextromethorphan, hydrALAZINE   Vital Signs    Vitals:   07/04/18 2144 07/05/18 0239 07/05/18 0544 07/05/18 0902  BP: (!) 143/85  (!) 153/97 (!) 140/93  Pulse:   75 83  Resp:   20 18  Temp:   98.9 F (37.2 C)   TempSrc:   Oral   SpO2:   95% 96%  Weight:  98.4 kg    Height:        Intake/Output Summary (Last 24 hours) at 07/05/2018 0919 Last data filed at 07/05/2018 7124 Gross per 24 hour  Intake 720 ml  Output 3200 ml  Net -2480 ml   Last 3 Weights 07/05/2018 07/04/2018 07/03/2018  Weight (lbs) 217 lb 222 lb 6.4 oz 224 lb 8 oz  Weight (kg) 98.431 kg 100.88 kg 101.833 kg      Telemetry    sinus - Personally Reviewed  ECG    No new tracings - Personally Reviewed  Physical Exam   GEN: No acute distress, sitting on the side of the bed Neck: + JVD Cardiac: RRR, no murmurs, rubs, or gallops.  Respiratory: Clear to auscultation bilaterally in upper lobes, crackles in bases, on O2 GI: Soft, nontender, non-distended  MS: 1+ b LE edema Neuro:  Nonfocal  Psych: Normal affect   Labs    Chemistry Recent Labs  Lab 07/03/18 1120 07/03/18 1958 07/04/18 0548 07/05/18 0514  NA 143  --  144 143  K 3.5  --  4.0 3.7  CL 104  --  104 98  CO2 31  --  30 33*  GLUCOSE 80  --  162*  125*  BUN 27*  --  25* 23  CREATININE 2.16* 2.08* 2.10* 1.99*  CALCIUM 9.3  --  9.3 9.4  PROT 6.9  --   --   --   ALBUMIN 3.2*  --   --   --   AST 40  --   --   --   ALT 59*  --   --   --   ALKPHOS 74  --   --   --   BILITOT 0.7  --   --   --   GFRNONAA 28* 29* 29* 31*  GFRAA 32* 34* 33* 35*  ANIONGAP 8  --  10 12     Hematology Recent Labs  Lab 07/03/18 1155 07/03/18 1958  WBC 5.4 5.5  RBC 3.40* 3.46*  HGB 9.8* 10.1*  HCT 32.9* 33.5*  MCV 96.8 96.8  MCH 28.8 29.2  MCHC 29.8* 30.1  RDW 16.1* 16.2*  PLT 194 198    Cardiac EnzymesNo results for input(s): TROPONINI in the last 168 hours.  Recent Labs  Lab 07/03/18 1140  TROPIPOC 0.04  BNP Recent Labs  Lab 07/01/18 1233 07/03/18 1155  BNP  --  1,606.3*  PROBNP 5,013*  --      DDimer No results for input(s): DDIMER in the last 168 hours.   Radiology    Dg Chest 2 View  Result Date: 07/03/2018 CLINICAL DATA:  Shortness of breath and cough for 3 weeks EXAM: CHEST - 2 VIEW COMPARISON:  07/01/2018 FINDINGS: Cardiac shadow is again enlarged but stable. Small right pleural effusion is again noted with right basilar atelectasis which has improved slightly in the interval from the prior exam. Mild vascular congestion is noted. Bibasilar atelectatic changes are seen. IMPRESSION: Slight improved aeration in the right base although persistent small effusion is noted. Electronically Signed   By: Inez Catalina M.D.   On: 07/03/2018 12:15   Dg Chest Port 1 View  Result Date: 07/04/2018 CLINICAL DATA:  Shortness of breath EXAM: PORTABLE CHEST 1 VIEW COMPARISON:  Yesterday FINDINGS: Cardiomegaly and vascular pedicle widening. Diffuse interstitial opacity with symmetric hazy airspace opacity at the bases. There is likely pleural effusions. No pneumothorax. IMPRESSION: CHF pattern Electronically Signed   By: Monte Fantasia M.D.   On: 07/04/2018 06:31    Cardiac Studies   Echo 05/29/18: 1. The left ventricle has  moderate-severely reduced systolic function of 54-65%. The cavity size was moderately increased. There is mildly increased left ventricular wall thickness. Echo evidence of pseudonormalization in diastolic relaxation Elevated  mean left atrial pressure Left ventricular diffuse hypokinesis.  2. The right ventricle has normal systolic function. The cavity was normal. There is no increase in right ventricular wall thickness. Right ventricular systolic pressure is moderately elevated with an estimated pressure of 59.3 mmHg.  3. Left atrial size was moderately dilated.  4. Right atrial size was mildly dilated.  5. The mitral valve is normal in structure.  6. The tricuspid valve is normal in structure.  7. The aortic valve is tricuspid There is mild thickening of the aortic valve. Aortic valve regurgitation is mild by color flow Doppler.  8. The pulmonic valve was normal in structure. Pulmonic valve regurgitation is mild by color flow Doppler.  9. There is mild dilatation of the ascending aorta. 10. Moderate to severe global reduction in LV systolic function; moderate diastolic dysfunction; moderate LVE; mild LVH; mildly dilated ascending aorta; biatrial enlargement; mild AI, MR and TR; moderate pulmonary hypertension.   Right and left heart cath 06/18/18:  Prox RCA lesion is 30% stenosed.  Dist RCA-1 lesion is 20% stenosed.  Dist RCA-2 lesion is 20% stenosed.  Prox Cx lesion is 10% stenosed.  Dist LM to Ost LAD lesion is 20% stenosed.  Prox LAD lesion is 50% stenosed.  Prox LAD to Mid LAD lesion is 40% stenosed.   Significant right heart pressure elevation with severe pulmonary hypertension with a PA pressure of 72/27, mean 47.  Mild to moderate nonobstructive disease with mild proximal calcification of the LAD with 20% narrowing, 50% smooth narrowing after the diagonal vessel with 40% mid stenosis and a large wraparound LAD; mild 10% narrowing in the proximal circumflex; and mild  irregularity of the RCA with 30% proximal stenosis and segmental 20% distal stenoses.  Findings consistent with a nonischemic cardiomyopathy.  RECOMMENDATION: The patient was short of breath at the completion of the procedure and received Lasix 40 mg at the completion of the procedure with prompt diuresis of over 450 cc prior to leaving the laboratory.  Will initially be monitored in the holding area to assess  oxygenation and additional diuresis.  The patient had held his Lasix for 2 days prior to the procedure which undoubtedly contributed to his significant right heart pressure elevation.  Guideline directed medical therapy for his cardiomyopathy will be necessary.  Medical therapy initially for his CAD.  Initiation of lipid-lowering therapy.  Optimal blood pressure control with ideal blood pressure less than 120/80.  Patient will follow-up with Dr. Golden Hurter.   Patient Profile     83 y.o. male with a history of mild to moderate CAD on cardiac catheterization in 05/2018, non-ischemic cardiomyopathy with EF of 30-35%, severe pulmonary hypertension with mean PA pressure on 47 mmHg on right cardiac catheterization in 05/2018, hypertension, hyperlipidemia, type 2 diabetes mellitus, rheumatic fever, peripheral neuropathy, CKD stage III, and prostate cancer s/p radiation who is being seen today for acute on chronic CHF   Assessment & Plan    1. Acute on chronic systolic heart failure - he is diuresing on 80 mg IV lasix BID - he is overall net negative 3.8 L with 3.2 L urine output yesterday - his weight is 217 lbs from 227 on admission, pt states his dry weight is near 210 lbs - sCr 1.99 (2.10) - making good urine - K 3.7 - he is still volume overloaded, will likely need to continue IV diuresis through the weekend - remains on O2, will need to wean off prior to discharge - medications as below - continue BB, imdur/hydralazine, holding ACEI/ARB for renal function   2. Nonobstructive CAD - no  chest pain   3. Acute on chronic kidney disease stage III - making urine - sCr seems to have leveled off - sCr 1.99 (2.10), K stable - continue with IV diuresis   4. Hypertension - pressures have been elevated - on imdur 15 mg, hydralazine 10 mg TID,  Coreg 6.25 mg BID - increase hydralazine to 25 mg TID - follow pressure, consider increasing imdur       For questions or updates, please contact [place] Please consult [email or web address] for contact info under        Signed, Ledora Bottcher, PA  07/05/2018, 9:19 AM    Feels well, sitting up on side of the bed.  Gen: NAD CV: RRR, no murmurs Lungs: crackles in the bilateral bases Abd: soft Extrem: warm, 1+ edema  Continue IV diuresis. Agree with recommendations as noted above. Increase hydralazine for hypertension.  Elouise Munroe 07/05/18

## 2018-07-05 NOTE — Plan of Care (Signed)

## 2018-07-06 DIAGNOSIS — I5043 Acute on chronic combined systolic (congestive) and diastolic (congestive) heart failure: Secondary | ICD-10-CM

## 2018-07-06 LAB — BASIC METABOLIC PANEL
Anion gap: 9 (ref 5–15)
BUN: 25 mg/dL — ABNORMAL HIGH (ref 8–23)
CO2: 33 mmol/L — ABNORMAL HIGH (ref 22–32)
Calcium: 8.9 mg/dL (ref 8.9–10.3)
Chloride: 99 mmol/L (ref 98–111)
Creatinine, Ser: 1.84 mg/dL — ABNORMAL HIGH (ref 0.61–1.24)
GFR calc Af Amer: 39 mL/min — ABNORMAL LOW (ref >60)
GFR calc non Af Amer: 34 mL/min — ABNORMAL LOW (ref >60)
Glucose, Bld: 118 mg/dL — ABNORMAL HIGH (ref 70–99)
Potassium: 3.5 mmol/L (ref 3.5–5.1)
Sodium: 141 mmol/L (ref 135–145)

## 2018-07-06 LAB — GLUCOSE, CAPILLARY
Glucose-Capillary: 128 mg/dL — ABNORMAL HIGH (ref 70–99)
Glucose-Capillary: 178 mg/dL — ABNORMAL HIGH (ref 70–99)
Glucose-Capillary: 188 mg/dL — ABNORMAL HIGH (ref 70–99)

## 2018-07-06 MED ORDER — POTASSIUM CHLORIDE CRYS ER 20 MEQ PO TBCR
40.0000 meq | EXTENDED_RELEASE_TABLET | Freq: Once | ORAL | Status: AC
  Administered 2018-07-06: 40 meq via ORAL
  Filled 2018-07-06: qty 2

## 2018-07-06 MED ORDER — MUSCLE RUB 10-15 % EX CREA
TOPICAL_CREAM | CUTANEOUS | Status: DC | PRN
  Administered 2018-07-07 – 2018-07-10 (×2): via TOPICAL
  Filled 2018-07-06: qty 85

## 2018-07-06 NOTE — Progress Notes (Signed)
PROGRESS NOTE    Allen Dyer  HCW:[id number] DOB: 1935-10-31 DOA: 07/03/2018 PCP: [name], MD    Brief Narrative:  83 year old male who presented with dyspnea and edema.  He does have significant past medical history for chronic systolic heart failure, type 2 diabetes mellitus, hypertension, dyslipidemia, and chronic kidney disease stage III.  He had a recent hospitalization for heart failure decompensation.  He reports worsening dyspnea for the last 2 weeks, associated with worsening edema and 7 pound weight gain.  He was seen at the outpatient cardiology clinic, his furosemide dose was increased without improvement of his symptoms.  On his initial physical examination his blood pressure was 140/94, heart rate 94, respiratory rate 28, oxygen saturation 96%, on supplemental oxygen, he had moist mucous membranes, lungs with bibasilar rales, increase inspiratory effort, heart S1-S2 present and rhythmic, abdomen soft nontender, positive pitting bilateral lower extremity edema.  Sodium 143, potassium 3.5, chloride 104, bicarb 31, glucose 80, BUN 27, creatinine 2.1, BNP 1606, white count 5.4, hemoglobin 9.8, creatinine 2.9, platelets 194.  His chest radiograph had cardiomegaly, bilateral interstitial infiltrates, vascular congestion and bilateral small pleural effusions.  His EKG was sinus rhythm with a first-degree AV block, rate 75 bpm, left axis with left anterior fascicular block and right bundle branch block, no significant ST segment or T wave changes.  Patient was admitted to the hospital with the working diagnosis of acute decompensated systolic heart failure.   Assessment & Plan:   Active Problems:   Type 2 diabetes mellitus (HCC)   CKD (chronic kidney disease), stage III (HCC)   Chronic systolic CHF (congestive heart failure) (HCC)   Essential hypertension   Pulmonary hypertension (HCC)   CHF exacerbation (HCC)   AKI (acute kidney injury) ([place])   Acute on chronic congestive  heart failure ([place])  1. Acute decompensated systolic heart failure. Patient tolerating well  diuresis with IV furosemide 80 mg q12, urine output over last 24 H 1,850 ml. Persistent lower extremity edema. Heart failure management with carvedilol  hydralazine and isosorbide. Patient and his family have received heart failure nutrition teaching.    2. AKI on CKD stage 3. Renal function with serum cr at 1,84, today's  K at 3,4 and serum bicarbonate at 33. Will add Kcl for hypokalemia, for now will continue current dose of loop diuretic. Follow on renal panel in am, avoid hypotension and nephrotoxic medications.   3. HTN. Blood pressure control with hydralazine and isosorbide.   4. T2DM. Fasting glucose this am 118, will continue glucose cover and monitoring with insulin sliding scale.  5. Obesity. Calculated BMI is 33,4, will need outpatient follow up.   DVT prophylaxis: heparin   Code Status:  full Family Communication: no family at the  bedside Disposition Plan/ discharge barriers: pending clinical improvement   Body mass index is 33.45 kg/m. Malnutrition Type:      Malnutrition Characteristics:      Nutrition Interventions:     RN Pressure Injury Documentation:     Consultants:     Procedures:     Antimicrobials:       Subjective: Dyspnea and lower extremity edema have improved but not yet back to baseline, no chest pain, no nausea or vomiting.   Objective: Vitals:   07/06/18 0725 07/06/18 0923 07/06/18 1140 07/06/18 1149  BP:  (!) 142/79 135/79   Pulse:  83 73   Resp:  20 20   Temp:  98.3 F (36.8 C)    TempSrc:  Oral  SpO2: 97% 99% 90% 92%  Weight:      Height:        Intake/Output Summary (Last 24 hours) at 07/06/2018 1157 Last data filed at 07/06/2018 1140 Gross per 24 hour  Intake 720 ml  Output 3350 ml  Net -2630 ml   Filed Weights   07/04/18 0410 07/05/18 0239 07/06/18 0500  Weight: 100.9 kg 98.4 kg 98.3 kg    Examination:    General: Not in pain or dyspnea. Neurology: Awake and alert, non focal  E ENT: mild pallor, no icterus, oral mucosa moist Cardiovascular: No JVD. S1-S2 present, rhythmic, no gallops, rubs, or murmurs. +++ pitting lower extremity edema bilaterally. Pulmonary: positive breath sounds bilaterally, poor air movement, no frank wheezing, rhonchi or rales. Gastrointestinal. Abdomen with no organomegaly, non tender, no rebound or guarding Skin. No rashes Musculoskeletal: no joint deformities     Data Reviewed: I have personally reviewed following labs and imaging studies  CBC: Recent Labs  Lab 07/03/18 1155 07/03/18 1958  WBC 5.4 5.5  NEUTROABS 3.9  --   HGB 9.8* 10.1*  HCT 32.9* 33.5*  MCV 96.8 96.8  PLT 194 875   Basic Metabolic Panel: Recent Labs  Lab 07/01/18 1233 07/03/18 1120 07/03/18 1958 07/04/18 0548 07/05/18 0514 07/06/18 0402  NA 143 143  --  144 143 141  K 4.2 3.5  --  4.0 3.7 3.5  CL 100 104  --  104 98 99  CO2 27 31  --  30 33* 33*  GLUCOSE 172* 80  --  162* 125* 118*  BUN 27 27*  --  25* 23 25*  CREATININE 1.98* 2.16* 2.08* 2.10* 1.99* 1.84*  CALCIUM 9.3 9.3  --  9.3 9.4 8.9  MG  --   --  2.2  --   --   --    GFR: Estimated Creatinine Clearance: 35.5 mL/min (A) (by C-G formula based on SCr of 1.84 mg/dL (H)). Liver Function Tests: Recent Labs  Lab 07/03/18 1120  AST 40  ALT 59*  ALKPHOS 74  BILITOT 0.7  PROT 6.9  ALBUMIN 3.2*   No results for input(s): LIPASE, AMYLASE in the last 168 hours. No results for input(s): AMMONIA in the last 168 hours. Coagulation Profile: No results for input(s): INR, PROTIME in the last 168 hours. Cardiac Enzymes: No results for input(s): CKTOTAL, CKMB, CKMBINDEX, TROPONINI in the last 168 hours. BNP (last 3 results) Recent Labs    06/14/18 1222 07/01/18 1233  PROBNP 2,678* 5,013*   HbA1C: No results for input(s): HGBA1C in the last 72 hours. CBG: Recent Labs  Lab 07/05/18 0550 07/05/18 1201 07/05/18  1724 07/05/18 2129 07/06/18 0648  GLUCAP 163* 129* 174* 139* 128*   Lipid Profile: No results for input(s): CHOL, HDL, LDLCALC, TRIG, CHOLHDL, LDLDIRECT in the last 72 hours. Thyroid Function Tests: No results for input(s): TSH, T4TOTAL, FREET4, T3FREE, THYROIDAB in the last 72 hours. Anemia Panel: No results for input(s): VITAMINB12, FOLATE, FERRITIN, TIBC, IRON, RETICCTPCT in the last 72 hours.    Radiology Studies: I have reviewed all of the imaging during this hospital visit personally     Scheduled Meds: . carvedilol  6.25 mg Oral BID  . furosemide  40 mg Intravenous Once  . furosemide  80 mg Intravenous BID  . heparin  5,000 Units Subcutaneous Q8H  . hydrALAZINE  25 mg Oral TID  . insulin aspart  0-9 Units Subcutaneous TID WC  . ipratropium-albuterol  3 mL Nebulization QID  .  isosorbide mononitrate  15 mg Oral Daily  . oxybutynin  5 mg Oral Daily  . tamsulosin  0.4 mg Oral BID   Continuous Infusions:   LOS: 2 days        Camryn Lampson Gerome Apley, MD

## 2018-07-06 NOTE — Progress Notes (Signed)
02 dropped to 87 at rest walked on on 02 sating 90s, sitting up in the chair with not distress. Ordered muscle rub for Right knee pain. Plan to continue diuresing.

## 2018-07-06 NOTE — Progress Notes (Signed)
Progress Note  Patient Name: Allen Dyer Date of Encounter: 07/06/2018  Primary Cardiologist: [name], MD   Subjective   Improving  Inpatient Medications    Scheduled Meds:  carvedilol  6.25 mg Oral BID   furosemide  40 mg Intravenous Once   furosemide  80 mg Intravenous BID   heparin  5,000 Units Subcutaneous Q8H   hydrALAZINE  25 mg Oral TID   insulin aspart  0-9 Units Subcutaneous TID WC   ipratropium-albuterol  3 mL Nebulization QID   isosorbide mononitrate  15 mg Oral Daily   oxybutynin  5 mg Oral Daily   tamsulosin  0.4 mg Oral BID   Continuous Infusions:  PRN Meds: albuterol, diphenhydrAMINE, guaiFENesin-dextromethorphan, hydrALAZINE   Vital Signs    Vitals:   07/06/18 0725 07/06/18 0923 07/06/18 1140 07/06/18 1149  BP:  (!) 142/79 135/79   Pulse:  83 73   Resp:  20 20   Temp:  98.3 F (36.8 C)    TempSrc:  Oral    SpO2: 97% 99% 90% 92%  Weight:      Height:        Intake/Output Summary (Last 24 hours) at 07/06/2018 1212 Last data filed at 07/06/2018 1140 Gross per 24 hour  Intake 720 ml  Output 2700 ml  Net -1980 ml   Last 3 Weights 07/06/2018 07/05/2018 07/04/2018  Weight (lbs) 216 lb 12.8 oz 217 lb 222 lb 6.4 oz  Weight (kg) 98.34 kg 98.431 kg 100.88 kg      Telemetry    sinus - Personally Reviewed  ECG    No new tracings - Personally Reviewed  Physical Exam   GEN: No acute distress, sitting on the side of the bed Neck:  mild JVD Cardiac: RRR, no murmurs, rubs, or gallops.  Respiratory: Clear to auscultation bilaterally in upper lobes, faint crackles in bases GI: Soft, nontender, non-distended  MS: trace to 1+ b LE edema Neuro:  Nonfocal  Psych: Normal affect   Labs    Chemistry Recent Labs  Lab 07/03/18 1120  07/04/18 0548 07/05/18 0514 07/06/18 0402  NA 143  --  144 143 141  K 3.5  --  4.0 3.7 3.5  CL 104  --  104 98 99  CO2 31  --  30 33* 33*  GLUCOSE 80  --  162* 125* 118*  BUN 27*  --  25* 23 25*    CREATININE 2.16*   < > 2.10* 1.99* 1.84*  CALCIUM 9.3  --  9.3 9.4 8.9  PROT 6.9  --   --   --   --   ALBUMIN 3.2*  --   --   --   --   AST 40  --   --   --   --   ALT 59*  --   --   --   --   ALKPHOS 74  --   --   --   --   BILITOT 0.7  --   --   --   --   GFRNONAA 28*   < > 29* 31* 34*  GFRAA 32*   < > 33* 35* 39*  ANIONGAP 8  --  10 12 9    < > = values in this interval not displayed.     Hematology Recent Labs  Lab 07/03/18 1155 07/03/18 1958  WBC 5.4 5.5  RBC 3.40* 3.46*  HGB 9.8* 10.1*  HCT 32.9* 33.5*  MCV 96.8 96.8  MCH 28.8 29.2  MCHC 29.8* 30.1  RDW 16.1* 16.2*  PLT 194 198    Cardiac EnzymesNo results for input(s): TROPONINI in the last 168 hours.  Recent Labs  Lab 07/03/18 1140  TROPIPOC 0.04     BNP Recent Labs  Lab 07/01/18 1233 07/03/18 1155  BNP  --  1,606.3*  PROBNP 5,013*  --      DDimer No results for input(s): DDIMER in the last 168 hours.   Radiology    No results found.  Cardiac Studies   Echo 05/29/18: 1. The left ventricle has moderate-severely reduced systolic function of 57-01%. The cavity size was moderately increased. There is mildly increased left ventricular wall thickness. Echo evidence of pseudonormalization in diastolic relaxation Elevated  mean left atrial pressure Left ventricular diffuse hypokinesis.  2. The right ventricle has normal systolic function. The cavity was normal. There is no increase in right ventricular wall thickness. Right ventricular systolic pressure is moderately elevated with an estimated pressure of 59.3 mmHg.  3. Left atrial size was moderately dilated.  4. Right atrial size was mildly dilated.  5. The mitral valve is normal in structure.  6. The tricuspid valve is normal in structure.  7. The aortic valve is tricuspid There is mild thickening of the aortic valve. Aortic valve regurgitation is mild by color flow Doppler.  8. The pulmonic valve was normal in structure. Pulmonic valve regurgitation  is mild by color flow Doppler.  9. There is mild dilatation of the ascending aorta. 10. Moderate to severe global reduction in LV systolic function; moderate diastolic dysfunction; moderate LVE; mild LVH; mildly dilated ascending aorta; biatrial enlargement; mild AI, MR and TR; moderate pulmonary hypertension.   Right and left heart cath 06/18/18:  Prox RCA lesion is 30% stenosed.  Dist RCA-1 lesion is 20% stenosed.  Dist RCA-2 lesion is 20% stenosed.  Prox Cx lesion is 10% stenosed.  Dist LM to Ost LAD lesion is 20% stenosed.  Prox LAD lesion is 50% stenosed.  Prox LAD to Mid LAD lesion is 40% stenosed.   Significant right heart pressure elevation with severe pulmonary hypertension with a PA pressure of 72/27, mean 47.  Mild to moderate nonobstructive disease with mild proximal calcification of the LAD with 20% narrowing, 50% smooth narrowing after the diagonal vessel with 40% mid stenosis and a large wraparound LAD; mild 10% narrowing in the proximal circumflex; and mild irregularity of the RCA with 30% proximal stenosis and segmental 20% distal stenoses.  Findings consistent with a nonischemic cardiomyopathy.  RECOMMENDATION: The patient was short of breath at the completion of the procedure and received Lasix 40 mg at the completion of the procedure with prompt diuresis of over 450 cc prior to leaving the laboratory.  Will initially be monitored in the holding area to assess oxygenation and additional diuresis.  The patient had held his Lasix for 2 days prior to the procedure which undoubtedly contributed to his significant right heart pressure elevation.  Guideline directed medical therapy for his cardiomyopathy will be necessary.  Medical therapy initially for his CAD.  Initiation of lipid-lowering therapy.  Optimal blood pressure control with ideal blood pressure less than 120/80.  Patient will follow-up with Dr. Golden Hurter.   Patient Profile     83 y.o. male with a  history of mild to moderate CAD on cardiac catheterization in 05/2018, non-ischemic cardiomyopathy with EF of 30-35%, severe pulmonary hypertension with mean PA pressure on 47 mmHg on right cardiac catheterization in 05/2018,  hypertension, hyperlipidemia, type 2 diabetes mellitus, rheumatic fever, peripheral neuropathy, CKD stage III, and prostate cancer s/p radiation who is being seen today for acute on chronic CHF   Assessment & Plan    1. Acute on chronic systolic heart failure - he is diuresing on 80 mg IV lasix BID - he is overall net negative 6.5 L with 1.85 L urine output yesterday - his weight is 215 lbs from 227 on admission, pt states his dry weight is near 210 lbs - sCr 1.84 (2.10) - making good urine - K 3.7 -  Continue IV diuresis for additional day, consider transition to oral on Monday. - remains on O2, will need to wean off prior to discharge - medications as below - continue BB, imdur/hydralazine, holding ACEI/ARB for renal function   2. Nonobstructive CAD - no chest pain   3. Acute on chronic kidney disease stage III - making urine  - sCr seems to have leveled off - sCr 1.84 (2.10), K stable - continue with IV diuresis  - Baseline appears to be around 1.5 to 1.7.   4. Hypertension - pressures have been elevated - on imdur 15 mg, hydralazine 25 mg TID,  Coreg 6.25 mg BID - follow pressure, consider increasing imdur  5. Trifascicular Block - EKG shows RBBB, LAFB, and 1st degree AV blocker.  - Will need to use caution when up-titrating beta-blocker. - Monitor for bradycardia.      For questions or updates, please contact [place] Please consult [email or web address] for contact info under        Signed, Elouise Munroe, MD  07/06/2018, 12:12 PM

## 2018-07-07 LAB — GLUCOSE, CAPILLARY
Glucose-Capillary: 163 mg/dL — ABNORMAL HIGH (ref 70–99)
Glucose-Capillary: 133 mg/dL — ABNORMAL HIGH (ref 70–99)
Glucose-Capillary: 189 mg/dL — ABNORMAL HIGH (ref 70–99)
Glucose-Capillary: 160 mg/dL — ABNORMAL HIGH (ref 70–99)
Glucose-Capillary: 157 mg/dL — ABNORMAL HIGH (ref 70–99)

## 2018-07-07 LAB — BASIC METABOLIC PANEL
Anion gap: 9 (ref 5–15)
BUN: 21 mg/dL (ref 8–23)
CO2: 31 mmol/L (ref 22–32)
Calcium: 8.5 mg/dL — ABNORMAL LOW (ref 8.9–10.3)
Chloride: 97 mmol/L — ABNORMAL LOW (ref 98–111)
Creatinine, Ser: 1.86 mg/dL — ABNORMAL HIGH (ref 0.61–1.24)
GFR calc Af Amer: 38 mL/min — ABNORMAL LOW (ref >60)
GFR calc non Af Amer: 33 mL/min — ABNORMAL LOW (ref >60)
Glucose, Bld: 134 mg/dL — ABNORMAL HIGH (ref 70–99)
Potassium: 3.1 mmol/L — ABNORMAL LOW (ref 3.5–5.1)
Sodium: 137 mmol/L (ref 135–145)

## 2018-07-07 MED ORDER — POTASSIUM CHLORIDE CRYS ER 20 MEQ PO TBCR
40.0000 meq | EXTENDED_RELEASE_TABLET | ORAL | Status: AC
  Administered 2018-07-07 (×2): 40 meq via ORAL
  Filled 2018-07-07 (×2): qty 2

## 2018-07-07 NOTE — Progress Notes (Signed)
PROGRESS NOTE    Allen Dyer  FXJ:[id number] DOB: 1935/12/20 DOA: 07/03/2018 PCP: [name], MD    Brief Narrative:  83 year old male who presented with dyspnea and edema. He does have significant past medical history for chronic systolic heart failure, type 2 diabetes mellitus, hypertension, dyslipidemia, and chronic kidney disease stage III. He had a recent hospitalization for heart failure decompensation.He reports worsening dyspnea for the last 2 weeks, associated with worsening edema and 7pound weight gain. He was seen at the outpatient cardiology clinic, his furosemide dose was increased without improvement of his symptoms. On his initial physical examination his blood pressure was 140/94, heart rate 94, respiratory rate 28, oxygen saturation 96%, on supplemental oxygen, he had moist mucous membranes, lungs with bibasilar rales, increase inspiratory effort, heart S1-S2 presentandrhythmic, abdomen soft nontender, positive pitting bilateral lower extremity edema. Sodium 143, potassium 3.5, chloride 104, bicarb 31, glucose 80, BUN 27, creatinine 2.1, BNP 1606, white count 5.4, hemoglobin 9.8, creatinine 2.9, platelets 194.His chest radiograph had cardiomegaly, bilateral interstitial infiltrates,vascular congestion and bilateral small pleural effusions. His EKG was sinus rhythm with a first-degree AV block, rate 75 bpm, left axis with left anterior fascicular block and right bundle branch block, no significant ST segment or T wave changes.  Patient was admitted to the hospitalwith theworking diagnosis of acute decompensated systolic heart failure.   Assessment & Plan:   Active Problems:   Type 2 diabetes mellitus (HCC)   CKD (chronic kidney disease), stage III (HCC)   Chronic systolic CHF (congestive heart failure) (HCC)   Essential hypertension   Pulmonary hypertension (HCC)   CHF exacerbation (HCC)   AKI (acute kidney injury) ([place])   Acute on chronic congestive  heart failure (HCC)   Acute on chronic combined systolic and diastolic CHF (congestive heart failure) ([place])   1. Acute decompensated systolic heart failure with acute hypoxic respiratory failure. LV systolic function 64-15%, with diffuse hypokinesis. Continue with IV furosemide 80 mg q12, urine output over last 24 H 3,550  Ml. Improved symptoms but continue hypoxic on room air and persistent lower extremities edema. Continue carvedilol, hydralazine and isosorbide for heart failure management.  2. AKI on CKD stage 3. Today serum cr at 1,86, with K at 3,1  and serum bicarbonate at 13. Will add 80 meq Kcl today in 2 divided doses, and follow on renal panel in am. Continue diuresis with loop diuretic.   3. HTN. Continue hydralazine and isosorbide for blood pressure control.   4. T2DM. Glucose cover and monitoring with insulin sliding scale. Patient is tolerating po well  5. Obesity. Calculated BMI is 33,4,.   6. BPH. Continue tamsulosin.   DVT prophylaxis:heparin Code Status:full Family Communication:no family at the bedside Disposition Plan/ discharge barriers:pending clinical improvement  Body mass index is 36.73 kg/m. Malnutrition Type:      Malnutrition Characteristics:      Nutrition Interventions:     RN Pressure Injury Documentation:     Consultants:   Cardiology   Procedures:     Antimicrobials:       Subjective: Patient had oxygen desaturation on ambulation, his lower extremity edema is improving but not yet back to baseline. Has compression stockings placed.   Objective: Vitals:   07/06/18 1914 07/06/18 2100 07/07/18 0426 07/07/18 0826  BP:  133/77 (!) 129/95   Pulse:  81 73 66  Resp:  18 18 16   Temp:  99.9 F (37.7 C) 98.9 F (37.2 C)   TempSrc:  Oral Oral   SpO2:  97% 97% 98% 96%  Weight:   108 kg   Height:        Intake/Output Summary (Last 24 hours) at 07/07/2018 0915 Last data filed at 07/07/2018 0645 Gross per 24 hour   Intake 960 ml  Output 3350 ml  Net -2390 ml   Filed Weights   07/05/18 0239 07/06/18 0500 07/07/18 0426  Weight: 98.4 kg 98.3 kg 108 kg    Examination:   General: Not in pain or dyspnea Neurology: Awake and alert, non focal  E ENT: no pallor, no icterus, oral mucosa moist Cardiovascular: No JVD. S1-S2 present, rhythmic, no gallops, rubs, or murmurs. +++ pitting lower extremity edema. Pulmonary: positive breath sounds bilaterally, adequate air movement, no wheezing, rhonchi or rales. Gastrointestinal. Abdomen with no organomegaly, non tender, no rebound or guarding Skin. No rashes Musculoskeletal: no joint deformities     Data Reviewed: I have personally reviewed following labs and imaging studies  CBC: Recent Labs  Lab 07/03/18 1155 07/03/18 1958  WBC 5.4 5.5  NEUTROABS 3.9  --   HGB 9.8* 10.1*  HCT 32.9* 33.5*  MCV 96.8 96.8  PLT 194 353   Basic Metabolic Panel: Recent Labs  Lab 07/03/18 1120 07/03/18 1958 07/04/18 0548 07/05/18 0514 07/06/18 0402 07/07/18 0712  NA 143  --  144 143 141 137  K 3.5  --  4.0 3.7 3.5 3.1*  CL 104  --  104 98 99 97*  CO2 31  --  30 33* 33* 31  GLUCOSE 80  --  162* 125* 118* 134*  BUN 27*  --  25* 23 25* 21  CREATININE 2.16* 2.08* 2.10* 1.99* 1.84* 1.86*  CALCIUM 9.3  --  9.3 9.4 8.9 8.5*  MG  --  2.2  --   --   --   --    GFR: Estimated Creatinine Clearance: 36.8 mL/min (A) (by C-G formula based on SCr of 1.86 mg/dL (H)). Liver Function Tests: Recent Labs  Lab 07/03/18 1120  AST 40  ALT 59*  ALKPHOS 74  BILITOT 0.7  PROT 6.9  ALBUMIN 3.2*   No results for input(s): LIPASE, AMYLASE in the last 168 hours. No results for input(s): AMMONIA in the last 168 hours. Coagulation Profile: No results for input(s): INR, PROTIME in the last 168 hours. Cardiac Enzymes: No results for input(s): CKTOTAL, CKMB, CKMBINDEX, TROPONINI in the last 168 hours. BNP (last 3 results) Recent Labs    06/14/18 1222 07/01/18 1233   PROBNP 2,678* 5,013*   HbA1C: No results for input(s): HGBA1C in the last 72 hours. CBG: Recent Labs  Lab 07/06/18 0648 07/06/18 1142 07/06/18 1636 07/06/18 2100 07/07/18 0642  GLUCAP 128* 163* 178* 188* 133*   Lipid Profile: No results for input(s): CHOL, HDL, LDLCALC, TRIG, CHOLHDL, LDLDIRECT in the last 72 hours. Thyroid Function Tests: No results for input(s): TSH, T4TOTAL, FREET4, T3FREE, THYROIDAB in the last 72 hours. Anemia Panel: No results for input(s): VITAMINB12, FOLATE, FERRITIN, TIBC, IRON, RETICCTPCT in the last 72 hours.    Radiology Studies: I have reviewed all of the imaging during this hospital visit personally     Scheduled Meds: . carvedilol  6.25 mg Oral BID  . furosemide  40 mg Intravenous Once  . furosemide  80 mg Intravenous BID  . heparin  5,000 Units Subcutaneous Q8H  . hydrALAZINE  25 mg Oral TID  . insulin aspart  0-9 Units Subcutaneous TID WC  . ipratropium-albuterol  3 mL Nebulization QID  . isosorbide  mononitrate  15 mg Oral Daily  . oxybutynin  5 mg Oral Daily  . tamsulosin  0.4 mg Oral BID   Continuous Infusions:   LOS: 3 days        Allen Dyer Gerome Apley, MD

## 2018-07-07 NOTE — Progress Notes (Signed)
Progress Note  Patient Name: Allen Dyer Date of Encounter: 07/07/2018  Primary Cardiologist: [name], MD   Subjective   Improving  Inpatient Medications    Scheduled Meds:  carvedilol  6.25 mg Oral BID   furosemide  40 mg Intravenous Once   furosemide  80 mg Intravenous BID   heparin  5,000 Units Subcutaneous Q8H   hydrALAZINE  25 mg Oral TID   insulin aspart  0-9 Units Subcutaneous TID WC   ipratropium-albuterol  3 mL Nebulization QID   isosorbide mononitrate  15 mg Oral Daily   oxybutynin  5 mg Oral Daily   potassium chloride  40 mEq Oral Q4H   tamsulosin  0.4 mg Oral BID   Continuous Infusions:  PRN Meds: albuterol, diphenhydrAMINE, guaiFENesin-dextromethorphan, hydrALAZINE, Muscle Rub   Vital Signs    Vitals:   07/06/18 2100 07/07/18 0426 07/07/18 0826 07/07/18 1129  BP: 133/77 (!) 129/95  120/81  Pulse: 81 73 66 75  Resp: 18 18 16 18   Temp: 99.9 F (37.7 C) 98.9 F (37.2 C)    TempSrc: Oral Oral    SpO2: 97% 98% 96% 98%  Weight:  108 kg    Height:        Intake/Output Summary (Last 24 hours) at 07/07/2018 1140 Last data filed at 07/07/2018 1034 Gross per 24 hour  Intake 1200 ml  Output 2350 ml  Net -1150 ml   Last 3 Weights 07/07/2018 07/06/2018 07/05/2018  Weight (lbs) 238 lb 216 lb 12.8 oz 217 lb  Weight (kg) 107.956 kg 98.34 kg 98.431 kg      Telemetry    sinus - Personally Reviewed  ECG    No new tracings - Personally Reviewed  Physical Exam   GEN: No acute distress, sitting on the side of the bed Neck:  mild JVD Cardiac: RRR, no murmurs, rubs, or gallops.  Respiratory: Clear to auscultation bilaterally in upper lobes, faint crackles in bases GI: Soft, nontender, non-distended  MS: trace to 1+ b LE edema Neuro:  Nonfocal  Psych: Normal affect   Labs    Chemistry Recent Labs  Lab 07/03/18 1120  07/05/18 0514 07/06/18 0402 07/07/18 0712  NA 143   < > 143 141 137  K 3.5   < > 3.7 3.5 3.1*  CL 104   < > 98  99 97*  CO2 31   < > 33* 33* 31  GLUCOSE 80   < > 125* 118* 134*  BUN 27*   < > 23 25* 21  CREATININE 2.16*   < > 1.99* 1.84* 1.86*  CALCIUM 9.3   < > 9.4 8.9 8.5*  PROT 6.9  --   --   --   --   ALBUMIN 3.2*  --   --   --   --   AST 40  --   --   --   --   ALT 59*  --   --   --   --   ALKPHOS 74  --   --   --   --   BILITOT 0.7  --   --   --   --   GFRNONAA 28*   < > 31* 34* 33*  GFRAA 32*   < > 35* 39* 38*  ANIONGAP 8   < > 12 9 9    < > = values in this interval not displayed.     Hematology Recent Labs  Lab 07/03/18 1155 07/03/18 1958  WBC 5.4 5.5  RBC 3.40* 3.46*  HGB 9.8* 10.1*  HCT 32.9* 33.5*  MCV 96.8 96.8  MCH 28.8 29.2  MCHC 29.8* 30.1  RDW 16.1* 16.2*  PLT 194 198    Cardiac EnzymesNo results for input(s): TROPONINI in the last 168 hours.  Recent Labs  Lab 07/03/18 1140  TROPIPOC 0.04     BNP Recent Labs  Lab 07/01/18 1233 07/03/18 1155  BNP  --  1,606.3*  PROBNP 5,013*  --      DDimer No results for input(s): DDIMER in the last 168 hours.   Radiology    No results found.  Cardiac Studies   Echo 05/29/18: 1. The left ventricle has moderate-severely reduced systolic function of 81-01%. The cavity size was moderately increased. There is mildly increased left ventricular wall thickness. Echo evidence of pseudonormalization in diastolic relaxation Elevated  mean left atrial pressure Left ventricular diffuse hypokinesis.  2. The right ventricle has normal systolic function. The cavity was normal. There is no increase in right ventricular wall thickness. Right ventricular systolic pressure is moderately elevated with an estimated pressure of 59.3 mmHg.  3. Left atrial size was moderately dilated.  4. Right atrial size was mildly dilated.  5. The mitral valve is normal in structure.  6. The tricuspid valve is normal in structure.  7. The aortic valve is tricuspid There is mild thickening of the aortic valve. Aortic valve regurgitation is mild by  color flow Doppler.  8. The pulmonic valve was normal in structure. Pulmonic valve regurgitation is mild by color flow Doppler.  9. There is mild dilatation of the ascending aorta. 10. Moderate to severe global reduction in LV systolic function; moderate diastolic dysfunction; moderate LVE; mild LVH; mildly dilated ascending aorta; biatrial enlargement; mild AI, MR and TR; moderate pulmonary hypertension.   Right and left heart cath 06/18/18:  Prox RCA lesion is 30% stenosed.  Dist RCA-1 lesion is 20% stenosed.  Dist RCA-2 lesion is 20% stenosed.  Prox Cx lesion is 10% stenosed.  Dist LM to Ost LAD lesion is 20% stenosed.  Prox LAD lesion is 50% stenosed.  Prox LAD to Mid LAD lesion is 40% stenosed.   Significant right heart pressure elevation with severe pulmonary hypertension with a PA pressure of 72/27, mean 47.  Mild to moderate nonobstructive disease with mild proximal calcification of the LAD with 20% narrowing, 50% smooth narrowing after the diagonal vessel with 40% mid stenosis and a large wraparound LAD; mild 10% narrowing in the proximal circumflex; and mild irregularity of the RCA with 30% proximal stenosis and segmental 20% distal stenoses.  Findings consistent with a nonischemic cardiomyopathy.  RECOMMENDATION: The patient was short of breath at the completion of the procedure and received Lasix 40 mg at the completion of the procedure with prompt diuresis of over 450 cc prior to leaving the laboratory.  Will initially be monitored in the holding area to assess oxygenation and additional diuresis.  The patient had held his Lasix for 2 days prior to the procedure which undoubtedly contributed to his significant right heart pressure elevation.  Guideline directed medical therapy for his cardiomyopathy will be necessary.  Medical therapy initially for his CAD.  Initiation of lipid-lowering therapy.  Optimal blood pressure control with ideal blood pressure less than 120/80.   Patient will follow-up with Dr. Golden Hurter.   Patient Profile     83 y.o. male with a history of mild to moderate CAD on cardiac catheterization in 05/2018, non-ischemic cardiomyopathy  with EF of 30-35%, severe pulmonary hypertension with mean PA pressure on 47 mmHg on right cardiac catheterization in 05/2018, hypertension, hyperlipidemia, type 2 diabetes mellitus, rheumatic fever, peripheral neuropathy, CKD stage III, and prostate cancer s/p radiation who is being seen today for acute on chronic CHF   Assessment & Plan    1. Acute on chronic systolic heart failure - he is diuresing on 80 mg IV lasix BID - he is overall net negative 7.6 L with 3.5 L urine output yesterday - his weight is inaccurately recorded today. 227 on admission, pt states his dry weight is near 210 lbs, yesterday was 215 lb. - sCr 1.86 (2.10), stable - K 3.1, needs supplementation per IM -  Continue IV diuresis for additional day, consider transition to oral on Monday. - remains on O2, will need to wean off prior to discharge - medications as below - continue BB, imdur/hydralazine, holding ACEI/ARB for renal function  2. Nonobstructive CAD - no chest pain  3. Acute on chronic kidney disease stage III - making urine  - sCr seems to have leveled off - sCr 1.86 (2.10) - continue with IV diuresis  - Baseline appears to be around 1.5 to 1.7.   4. Hypertension - pressures have been elevated - on imdur 15 mg, hydralazine 25 mg TID,  Coreg 6.25 mg BID - follow pressure, consider increasing imdur  5. Trifascicular Block - EKG shows RBBB, LAFB, and 1st degree AV blocker.  - Will need to use caution when up-titrating beta-blocker. - Monitor for bradycardia.      For questions or updates, please contact [place] Please consult [email or web address] for contact info under        Signed, Elouise Munroe, MD  07/07/2018, 11:39 AM

## 2018-07-08 DIAGNOSIS — N179 Acute kidney failure, unspecified: Secondary | ICD-10-CM

## 2018-07-08 DIAGNOSIS — I10 Essential (primary) hypertension: Secondary | ICD-10-CM

## 2018-07-08 LAB — BASIC METABOLIC PANEL
Anion gap: 10 (ref 5–15)
BUN: 25 mg/dL — ABNORMAL HIGH (ref 8–23)
CO2: 33 mmol/L — ABNORMAL HIGH (ref 22–32)
Calcium: 9.2 mg/dL (ref 8.9–10.3)
Chloride: 94 mmol/L — ABNORMAL LOW (ref 98–111)
Creatinine, Ser: 2.09 mg/dL — ABNORMAL HIGH (ref 0.61–1.24)
GFR calc Af Amer: 33 mL/min — ABNORMAL LOW (ref >60)
GFR calc non Af Amer: 29 mL/min — ABNORMAL LOW (ref >60)
Glucose, Bld: 180 mg/dL — ABNORMAL HIGH (ref 70–99)
Potassium: 3.6 mmol/L (ref 3.5–5.1)
Sodium: 137 mmol/L (ref 135–145)

## 2018-07-08 LAB — GLUCOSE, CAPILLARY
Glucose-Capillary: 172 mg/dL — ABNORMAL HIGH (ref 70–99)
Glucose-Capillary: 162 mg/dL — ABNORMAL HIGH (ref 70–99)
Glucose-Capillary: 158 mg/dL — ABNORMAL HIGH (ref 70–99)
Glucose-Capillary: 211 mg/dL — ABNORMAL HIGH (ref 70–99)

## 2018-07-08 MED ORDER — PREDNISONE 20 MG PO TABS
20.0000 mg | ORAL_TABLET | Freq: Every day | ORAL | Status: DC
  Administered 2018-07-08 – 2018-07-10 (×3): 20 mg via ORAL
  Filled 2018-07-08 (×3): qty 1

## 2018-07-08 MED ORDER — DICLOFENAC SODIUM 1 % TD GEL
2.0000 g | Freq: Four times a day (QID) | TRANSDERMAL | Status: DC
  Administered 2018-07-08 – 2018-07-10 (×9): 2 g via TOPICAL
  Filled 2018-07-08: qty 100

## 2018-07-08 MED ORDER — PREDNISONE 20 MG PO TABS: 20.0000 mg | ORAL_TABLET | Freq: Every day | ORAL | Status: DC

## 2018-07-08 NOTE — Care Management Important Message (Signed)
Important Message  Patient Details  Name: Allen Dyer MRN: [id number] Date of Birth: [birth year]   Medicare Important Message Given:  Yes    Jameshia Hayashida P Merritt Kibby 07/08/2018, 4:08 PM

## 2018-07-08 NOTE — Progress Notes (Signed)
PROGRESS NOTE    Allen Dyer  TAV:[id number] DOB: 18-Mar-1936 DOA: 07/03/2018 PCP: [name], MD    Brief Narrative:  83 year old male who presented with dyspnea and edema. He does have significant past medical history for chronic systolic heart failure, type 2 diabetes mellitus, hypertension, dyslipidemia, and chronic kidney disease stage III. He had a recent hospitalization for heart failure decompensation.He reports worsening dyspnea for the last 2 weeks, associated with worsening edema and 7pound weight gain. He was seen at the outpatient cardiology clinic, his furosemide dose was increased without improvement of his symptoms. On his initial physical examination his blood pressure was 140/94, heart rate 94, respiratory rate 28, oxygen saturation 96%, on supplemental oxygen, he had moist mucous membranes, lungs with bibasilar rales, increase inspiratory effort, heart S1-S2 presentandrhythmic, abdomen soft nontender, positive pitting bilateral lower extremity edema. Sodium 143, potassium 3.5, chloride 104, bicarb 31, glucose 80, BUN 27, creatinine 2.1, BNP 1606, white count 5.4, hemoglobin 9.8, creatinine 2.9, platelets 194.His chest radiograph had cardiomegaly, bilateral interstitial infiltrates,vascular congestion and bilateral small pleural effusions. His EKG was sinus rhythm with a first-degree AV block, rate 75 bpm, left axis with left anterior fascicular block and right bundle branch block, no significant ST segment or T wave changes.  Patient was admitted to the hospitalwith theworking diagnosis of acute decompensated systolic heart failure.   Assessment & Plan:   Active Problems:   Type 2 diabetes mellitus (HCC)   CKD (chronic kidney disease), stage III (HCC)   Chronic systolic CHF (congestive heart failure) (HCC)   Essential hypertension   Pulmonary hypertension (HCC)   CHF exacerbation (HCC)   AKI (acute kidney injury) ([place])   Acute on chronic congestive  heart failure (HCC)   Acute on chronic combined systolic and diastolic CHF (congestive heart failure) ([place])  1. Acute decompensated systolic heart failure with acute hypoxic respiratory failure.LV systolic function 55-37%, with diffuse hypokinesis. Patient has been responding well to diuresis, his urine output over last 24 H 2,600 ml. Improved oxygenation on room air. Continue with compression stockings, possible transition to oral diuretics. Patient and his daughter had heart failure teaching, which I hope will help to reduce home sodium and fluid consumption.  Continue heart failure management with carvedilol,hydralazine and isosorbide.  2. AKI on CKD stage 3.  Stable renal function with serum Cr at 2,0 with K at 3,6 and serum bicarbonate at 33, will continue to follow on renal panel in am, avoid hypotension and nephrotoxic medications.  3. HTN.Tolerating well after load reduction with hydralazine and isosorbide, systolic blood pressure 482- 127 mmHg.   4. T2DM.Continue with glucose cover and monitoring, plus insulin sliding scale. Patient is tolerating po well  5. Obesity. Calculated BMI is 33,4,.  6. BPH. On tamsulosin.   7. Right wrist pain (new). Tender to palpation, local edema and decreased range of motion, possible gout related to loop diuretics, will add low dose prednisone and local diclofenac.   DVT prophylaxis:heparin Code Status:full Family Communication:no family at the bedside Disposition Plan/ discharge barriers:possible dc in am.     Body mass index is 33.18 kg/m. Malnutrition Type:      Malnutrition Characteristics:      Nutrition Interventions:     RN Pressure Injury Documentation:     Consultants:   Cardiology   Procedures:     Antimicrobials:       Subjective: Patient is feeling better, dyspnea and lower extremity continue to improve. Positive left wrist pain, tender to palpation, moderate to severe  pain, local edema  and decreased range of motion.   Objective: Vitals:   07/07/18 1900 07/07/18 2047 07/08/18 0421 07/08/18 0828  BP:  139/77 129/76   Pulse:  86 78   Resp:  18 20   Temp:  100.2 F (37.9 C) 98.1 F (36.7 C)   TempSrc:  Oral Oral   SpO2: 94% 98% 97% 95%  Weight:   97.5 kg   Height:        Intake/Output Summary (Last 24 hours) at 07/08/2018 0912 Last data filed at 07/08/2018 0600 Gross per 24 hour  Intake 720 ml  Output 2600 ml  Net -1880 ml   Filed Weights   07/06/18 0500 07/07/18 0426 07/08/18 0421  Weight: 98.3 kg 108 kg 97.5 kg    Examination:   General: Not in pain or dyspnea, deconditioned  Neurology: Awake and alert, non focal  E ENT: mild pallor, no icterus, oral mucosa moist Cardiovascular: No JVD. S1-S2 present, rhythmic, no gallops, rubs, or murmurs. ++ non pitting lower extremity edema. Pulmonary: positive breath sounds bilaterally, no wheezing, rhonchi or rales. Gastrointestinal. Abdomen fwith no organomegaly, non tender, no rebound or guarding Skin. No rashes Musculoskeletal: right wrist tender to palpation, decreased ranged of motion, positive edema, no erythema or increased local temperature.      Data Reviewed: I have personally reviewed following labs and imaging studies  CBC: Recent Labs  Lab 07/03/18 1155 07/03/18 1958  WBC 5.4 5.5  NEUTROABS 3.9  --   HGB 9.8* 10.1*  HCT 32.9* 33.5*  MCV 96.8 96.8  PLT 194 335   Basic Metabolic Panel: Recent Labs  Lab 07/03/18 1958 07/04/18 0548 07/05/18 0514 07/06/18 0402 07/07/18 0712 07/08/18 0606  NA  --  144 143 141 137 137  K  --  4.0 3.7 3.5 3.1* 3.6  CL  --  104 98 99 97* 94*  CO2  --  30 33* 33* 31 33*  GLUCOSE  --  162* 125* 118* 134* 180*  BUN  --  25* 23 25* 21 25*  CREATININE 2.08* 2.10* 1.99* 1.84* 1.86* 2.09*  CALCIUM  --  9.3 9.4 8.9 8.5* 9.2  MG 2.2  --   --   --   --   --    GFR: Estimated Creatinine Clearance: 31.1 mL/min (A) (by C-G formula based on SCr of 2.09 mg/dL (H)).  Liver Function Tests: Recent Labs  Lab 07/03/18 1120  AST 40  ALT 59*  ALKPHOS 74  BILITOT 0.7  PROT 6.9  ALBUMIN 3.2*   No results for input(s): LIPASE, AMYLASE in the last 168 hours. No results for input(s): AMMONIA in the last 168 hours. Coagulation Profile: No results for input(s): INR, PROTIME in the last 168 hours. Cardiac Enzymes: No results for input(s): CKTOTAL, CKMB, CKMBINDEX, TROPONINI in the last 168 hours. BNP (last 3 results) Recent Labs    06/14/18 1222 07/01/18 1233  PROBNP 2,678* 5,013*   HbA1C: No results for input(s): HGBA1C in the last 72 hours. CBG: Recent Labs  Lab 07/07/18 0642 07/07/18 1126 07/07/18 1629 07/07/18 2210 07/08/18 0630  GLUCAP 133* 189* 160* 157* 172*   Lipid Profile: No results for input(s): CHOL, HDL, LDLCALC, TRIG, CHOLHDL, LDLDIRECT in the last 72 hours. Thyroid Function Tests: No results for input(s): TSH, T4TOTAL, FREET4, T3FREE, THYROIDAB in the last 72 hours. Anemia Panel: No results for input(s): VITAMINB12, FOLATE, FERRITIN, TIBC, IRON, RETICCTPCT in the last 72 hours.    Radiology Studies: I have reviewed  all of the imaging during this hospital visit personally     Scheduled Meds: . carvedilol  6.25 mg Oral BID  . furosemide  40 mg Intravenous Once  . heparin  5,000 Units Subcutaneous Q8H  . hydrALAZINE  25 mg Oral TID  . insulin aspart  0-9 Units Subcutaneous TID WC  . ipratropium-albuterol  3 mL Nebulization QID  . isosorbide mononitrate  15 mg Oral Daily  . oxybutynin  5 mg Oral Daily  . tamsulosin  0.4 mg Oral BID   Continuous Infusions:   LOS: 4 days        Yuuki Skeens Gerome Apley, MD

## 2018-07-08 NOTE — Progress Notes (Signed)
SATURATION QUALIFICATIONS: (This note is used to comply with regulatory documentation for home oxygen)  Patient Saturations on Room Air at Rest = 86%  Patient Saturations on Room Air while Ambulating = 94%  Please briefly explain why patient needs home oxygen:Pt appears to need O2 at rest to keep sats >90% however with activity, does not desat.   [place] Pager:  [phone]  Office:  [phone]

## 2018-07-08 NOTE — Progress Notes (Addendum)
Physical Therapy Treatment Patient Details Name: Allen Dyer MRN: [id number] DOB: 01-07-1936 Today's Date: 07/08/2018    History of Present Illness Pt is an 83 y.o. male admitted 07/03/18 with SOB and swelling. Worked up for CHF and pulmonary hypertension. Of note, recent admission with heart cath 2/25 showing mild nonobstructive CAD. PMH includes HTN, DM2, CAD.    PT Comments    Pt admitted with above diagnosis. Pt currently with functional limitations due to the deficits listed below (see PT Problem List). Pt was able to ambulate with RW but definitely needing device and previously not using one.  Daughter supportive and states he will have 24 hour care.  Feel that pt will need RW and [place] f/u.  Desats at rest but O2 stayed above 90% with activity on RA.  May need home O2 at rest.  Pt will benefit from skilled PT to increase their independence and safety with mobility to allow discharge to the venue listed below.    SATURATION QUALIFICATIONS: (This note is used to comply with regulatory documentation for home oxygen)  Patient Saturations on Room Air at Rest = 86%  Patient Saturations on Room Air while Ambulating = 94%  Please briefly explain why patient needs home oxygen:Pt appears to need O2 at rest to keep sats >90% however with activity, does not desat.     Follow Up Recommendations  Supervision - Intermittent;Home health PT     Equipment Recommendations  Rolling walker with 5" wheels    Recommendations for Other Services       Precautions / Restrictions Precautions Precautions: Fall Restrictions Weight Bearing Restrictions: No    Mobility  Bed Mobility Overal bed mobility: Modified Independent                Transfers Overall transfer level: Needs assistance Equipment used: Rolling walker (2 wheeled) Transfers: Sit to/from Stand Sit to Stand: Min guard         General transfer comment: Pt took incr time to stand as he was sore and "stiff" per pt and  daughter due to not getting up over weekend.  Incr time to come to standing and get balanc.e    Ambulation/Gait Ambulation/Gait assistance: Min guard Gait Distance (Feet): 220 Feet Assistive device: Rolling walker (2 wheeled) Gait Pattern/deviations: Step-through pattern;Decreased stride length;Wide base of support Gait velocity: Decreased Gait velocity interpretation: 1.31 - 2.62 ft/sec, indicative of limited community ambulator General Gait Details: Slow, steady gait but needed RW for stability today.   SpO2 at rest 86-88% on RA if not practicing deep breathing, however maintaining O2 in >90% on RA with activity.     Stairs             Wheelchair Mobility    Modified Rankin (Stroke Patients Only)       Balance Overall balance assessment: Needs assistance Sitting-balance support: No upper extremity supported;Feet supported Sitting balance-Leahy Scale: Good     Standing balance support: Bilateral upper extremity supported;During functional activity Standing balance-Leahy Scale: Fair Standing balance comment: relies on UE support dynamically but can stand statically without device.                             Cognition Arousal/Alertness: Awake/alert Behavior During Therapy: WFL for tasks assessed/performed Overall Cognitive Status: Within Functional Limits for tasks assessed  Exercises      General Comments General comments (skin integrity, edema, etc.): Reviewed incentive spirometer use      Pertinent Vitals/Pain Pain Assessment: No/denies pain    Home Living                      Prior Function            PT Goals (current goals can now be found in the care plan section) Progress towards PT goals: Progressing toward goals    Frequency    Min 3X/week      PT Plan Discharge plan needs to be updated    Co-evaluation              AM-PAC PT "6 Clicks" Mobility    Outcome Measure  Help needed turning from your back to your side while in a flat bed without using bedrails?: None Help needed moving from lying on your back to sitting on the side of a flat bed without using bedrails?: None Help needed moving to and from a bed to a chair (including a wheelchair)?: None Help needed standing up from a chair using your arms (e.g., wheelchair or bedside chair)?: None Help needed to walk in hospital room?: A Little Help needed climbing 3-5 steps with a railing? : A Little 6 Click Score: 22    End of Session Equipment Utilized During Treatment: Gait belt;Oxygen Activity Tolerance: Patient tolerated treatment well Patient left: in bed;with call bell/phone within reach;with family/visitor present(seated EOB) Nurse Communication: Mobility status PT Visit Diagnosis: Other abnormalities of gait and mobility (R26.89)     Time: 0950-1030 PT Time Calculation (min) (ACUTE ONLY): 40 min  Charges:  $Gait Training: 23-37 mins $Self Care/Home Management: 8-22                     [place] Pager:  [phone]  Office:  [place] 07/08/2018, 1:16 PM

## 2018-07-08 NOTE — Progress Notes (Signed)
Progress Note  Patient Name: Allen Dyer Date of Encounter: 07/08/2018  Primary Cardiologist: [name], MD   Subjective   Feeling well.  Still short of breath with ambulation.  No chest pain or pressure.   Inpatient Medications    Scheduled Meds: . carvedilol  6.25 mg Oral BID  . furosemide  40 mg Intravenous Once  . furosemide  80 mg Intravenous BID  . heparin  5,000 Units Subcutaneous Q8H  . hydrALAZINE  25 mg Oral TID  . insulin aspart  0-9 Units Subcutaneous TID WC  . ipratropium-albuterol  3 mL Nebulization QID  . isosorbide mononitrate  15 mg Oral Daily  . oxybutynin  5 mg Oral Daily  . tamsulosin  0.4 mg Oral BID   Continuous Infusions:  PRN Meds: albuterol, diphenhydrAMINE, guaiFENesin-dextromethorphan, hydrALAZINE, Muscle Rub   Vital Signs    Vitals:   07/07/18 1900 07/07/18 2047 07/08/18 0421 07/08/18 0828  BP:  139/77 129/76   Pulse:  86 78   Resp:  18 20   Temp:  100.2 F (37.9 C) 98.1 F (36.7 C)   TempSrc:  Oral Oral   SpO2: 94% 98% 97% 95%  Weight:   97.5 kg   Height:        Intake/Output Summary (Last 24 hours) at 07/08/2018 0855 Last data filed at 07/08/2018 0600 Gross per 24 hour  Intake 720 ml  Output 2600 ml  Net -1880 ml   Last 3 Weights 07/08/2018 07/07/2018 07/06/2018  Weight (lbs) 215 lb 238 lb 216 lb 12.8 oz  Weight (kg) 97.523 kg 107.956 kg 98.34 kg      Telemetry    Sinus rhythm.  Rare PAC/PVC - Personally Reviewed  ECG    n/a - Personally Reviewed  Physical Exam   VS:  BP 129/76 (BP Location: Left Arm)   Pulse 78   Temp 98.1 F (36.7 C) (Oral)   Resp 20   Ht 5' 7.5" (1.715 m)   Wt 97.5 kg   SpO2 95%   BMI 33.18 kg/m  , BMI Body mass index is 33.18 kg/m. GENERAL:  Well appearing.  No acute distress HEENT: Pupils equal round and reactive, fundi not visualized, oral mucosa unremarkable NECK:  Jugular venous distention 1 cm above clavicle at 45 degrees, waveform within normal limits, carotid upstroke brisk  and symmetric, no bruits LUNGS:  Clear to auscultation bilaterally HEART:  RRR.  PMI not displaced or sustained,S1 and S2 within normal limits, no S3, no S4, no clicks, no rubs, no murmurs ABD:  Flat, positive bowel sounds normal in frequency in pitch, no bruits, no rebound, no guarding, no midline pulsatile mass, no hepatomegaly, no splenomegaly EXT:  2 plus pulses throughout, 2+ pedal edema bilaterally.  R UE edema, no cyanosis no clubbing SKIN:  No rashes no nodules NEURO:  Cranial nerves II through XII grossly intact, motor grossly intact throughout [facility]:  Cognitively intact, oriented to person place and time   Labs    Chemistry Recent Labs  Lab 07/03/18 1120  07/06/18 0402 07/07/18 0712 07/08/18 0606  NA 143   < > 141 137 137  K 3.5   < > 3.5 3.1* 3.6  CL 104   < > 99 97* 94*  CO2 31   < > 33* 31 33*  GLUCOSE 80   < > 118* 134* 180*  BUN 27*   < > 25* 21 25*  CREATININE 2.16*   < > 1.84* 1.86* 2.09*  CALCIUM 9.3   < >  8.9 8.5* 9.2  PROT 6.9  --   --   --   --   ALBUMIN 3.2*  --   --   --   --   AST 40  --   --   --   --   ALT 59*  --   --   --   --   ALKPHOS 74  --   --   --   --   BILITOT 0.7  --   --   --   --   GFRNONAA 28*   < > 34* 33* 29*  GFRAA 32*   < > 39* 38* 33*  ANIONGAP 8   < > 9 9 10    < > = values in this interval not displayed.     Hematology Recent Labs  Lab 07/03/18 1155 07/03/18 1958  WBC 5.4 5.5  RBC 3.40* 3.46*  HGB 9.8* 10.1*  HCT 32.9* 33.5*  MCV 96.8 96.8  MCH 28.8 29.2  MCHC 29.8* 30.1  RDW 16.1* 16.2*  PLT 194 198    Cardiac EnzymesNo results for input(s): TROPONINI in the last 168 hours.  Recent Labs  Lab 07/03/18 1140  TROPIPOC 0.04     BNP Recent Labs  Lab 07/01/18 1233 07/03/18 1155  BNP  --  1,606.3*  PROBNP 5,013*  --      DDimer No results for input(s): DDIMER in the last 168 hours.   Radiology    No results found.  Cardiac Studies   Echo 05/29/18: IMPRESSIONS   1. The left ventricle has  moderate-severely reduced systolic function of 86-57%. The cavity size was moderately increased. There is mildly increased left ventricular wall thickness. Echo evidence of pseudonormalization in diastolic relaxation Elevated  mean left atrial pressure Left ventricular diffuse hypokinesis.  2. The right ventricle has normal systolic function. The cavity was normal. There is no increase in right ventricular wall thickness. Right ventricular systolic pressure is moderately elevated with an estimated pressure of 59.3 mmHg.  3. Left atrial size was moderately dilated.  4. Right atrial size was mildly dilated.  5. The mitral valve is normal in structure.  6. The tricuspid valve is normal in structure.  7. The aortic valve is tricuspid There is mild thickening of the aortic valve. Aortic valve regurgitation is mild by color flow Doppler.  8. The pulmonic valve was normal in structure. Pulmonic valve regurgitation is mild by color flow Doppler.  9. There is mild dilatation of the ascending aorta. 10. Moderate to severe global reduction in LV systolic function; moderate diastolic dysfunction; moderate LVE; mild LVH; mildly dilated ascending aorta; biatrial enlargement; mild AI, MR and TR; moderate pulmonary hypertension.  LHC/RHC 06/18/18:  Prox RCA lesion is 30% stenosed.  Dist RCA-1 lesion is 20% stenosed.  Dist RCA-2 lesion is 20% stenosed.  Prox Cx lesion is 10% stenosed.  Dist LM to Ost LAD lesion is 20% stenosed.  Prox LAD lesion is 50% stenosed.  Prox LAD to Mid LAD lesion is 40% stenosed.   Significant right heart pressure elevation with severe pulmonary hypertension with a PA pressure of 72/27, mean 47.  Mild to moderate nonobstructive disease with mild proximal calcification of the LAD with 20% narrowing, 50% smooth narrowing after the diagonal vessel with 40% mid stenosis and a large wraparound LAD; mild 10% narrowing in the proximal circumflex; and mild irregularity of the RCA with  30% proximal stenosis and segmental 20% distal stenoses.  Patient Profile  83 y.o. male with chronic systolic and diastolic heart failure (NICM), mild-moderate CAD on cath 05/2018, severe pulmonary hypertension, hypertension, hyperlipidemia, DM, rheumatic fever, CKD III, and prostate cancer s/p XRT here with acute on chronic heart failure  Assessment & Plan    # Acute on chronic systolic and diastolic HF: # Severe pulmonary hypertension: Creatinine worsening today with diuresis.  His weight is now near baseline.Will stop IV lasix and hold AM dose.  Will give a dose of oral this afternoon.  He continues to have a supplemental oxygen requirement.  JVP is elevated and he has pedal edema but lungs are clear.  This is consistent with elevated R sided pressure.  It is unclear why he has severe pulmonary hypertension.  Mean PAP on cath was 47 mmHg.  PASP was 59 on echo.  However he was volume overloaded at the time (RA 13, LVEDP 32, PCWP 29).  Left sided heart failure was likely contributing.  Continue carvedilol, hydralazine and Imdur.  Sleep study pending as an outpatient.  Consider a V/Q scan to evaluate for chronic PE.  He will need PFTs once stable.  Will plan to have him see Dr. Aundra Dubin as an outpatient to see if the above testing is negative.    # Hypertension: BP well-controlled on current regimen.  He is not on Entresto 2/2 CKD and diuresis.  # CKD III: Creatinine slightly worse as above.  Holding diuresis as above.       For questions or updates, please contact [place] Please consult [email or web address] for contact info under        Signed, Skeet Latch, MD  07/08/2018, 8:55 AM

## 2018-07-08 NOTE — TOC Initial Note (Signed)
Transition of Care [facility]) - Initial/Assessment Note    Patient Details  Name: Allen Dyer MRN: [id number] Date of Birth: 28-Feb-1936  Transition of Care [facility]) CM/SW Contact:    Sherrilyn Rist Phone Number: [phone] 07/08/2018, 1:31 PM  Clinical Narrative:                 Patient lives at home alone; PCP: [name], MD; has private insurance with Medicare; no DME at home; has a supportive daughter. HHC choice offered, pt chose [facility]; [place] with [facility] called for arrangements; pt possibly need home oxygen at discharge. CM will continue to follow for progression of care  Expected Discharge Plan: [place] Barriers to Discharge: No Barriers Identified   Patient Goals and CMS Choice Patient states their goals for this hospitalization and ongoing recovery are:: to breathe better CMS Medicare.gov Compare Post Acute Care list provided to:: Patient Choice offered to / list presented to : Patient  Expected Discharge Plan and Services Expected Discharge Plan: [place] Arranged: RN, Disease Management, PT [place] Agency: [facility] (formerly [facility])  Prior Living Arrangements/Services   Lives with:: Self Patient language and need for interpreter reviewed:: No Do you feel safe going back to the place where you live?: Yes      Need for Family Participation in Patient Care: No (Comment) Care giver support system in place?: Yes (comment)   Criminal Activity/Legal Involvement Pertinent to Current Situation/Hospitalization: No - Comment as needed  Activities of Daily Living Home Assistive Devices/Equipment: None ADL Screening (condition at time of admission) Patient's cognitive ability adequate to safely complete daily activities?: Yes Is the patient deaf or have difficulty hearing?: Yes Does the patient have difficulty seeing, even when wearing glasses/contacts?:  No Does the patient have difficulty concentrating, remembering, or making decisions?: No Patient able to express need for assistance with ADLs?: Yes Does the patient have difficulty dressing or bathing?: No Independently performs ADLs?: Yes (appropriate for developmental age) Does the patient have difficulty walking or climbing stairs?: No Weakness of Legs: None Weakness of Arms/Hands: None  Permission Sought/Granted Permission sought to share information with : Case Manager Permission granted to share information with : Yes, Verbal Permission Granted  Share Information with NAME: Carmie Kanner (Daughter)  Permission granted to share info w AGENCY: [place] agency        Emotional Assessment Appearance:: Well-Groomed Attitude/Demeanor/Rapport: Gracious Affect (typically observed): Accepting Orientation: : Oriented to Self, Oriented to  Time, Oriented to Place, Oriented to Situation Alcohol / Substance Use: Not Applicable Psych Involvement: No (comment)  Admission diagnosis:  Hypoxia [R09.02] Acute on chronic congestive heart failure, unspecified heart failure type [facility]) [I50.9] Patient Active Problem List   Diagnosis Date Noted  . Acute on chronic combined systolic and diastolic CHF (congestive heart failure) ([place])   . Acute on chronic congestive heart failure ([place]) 07/04/2018  . CHF exacerbation ([place]) 07/03/2018  . AKI (acute kidney injury) ([place]) 07/03/2018  . Acute CHF (congestive heart failure) ([place]) 06/18/2018  . NICM (nonischemic cardiomyopathy) ([place])   . Pulmonary hypertension ([place])   . Essential hypertension 06/14/2018  . Type 2 diabetes mellitus ([place])   . Prostate cancer ([place])   . Rheumatic fever   . Peripheral neuropathy   . Lower extremity edema   .  CKD (chronic kidney disease), stage III ([place])   . Hyperlipidemia   . Chronic systolic CHF (congestive heart failure) ([place])    PCP:  [name], MD Pharmacy:   [facility] [place] [address] Phone: [phone] Fax: [phone]  [facility] [place], [place] [address] [place] [address] Phone: [phone] Fax: [phone]     Social Determinants of Health ([place]) Interventions    Readmission Risk Interventions 30 Day Unplanned Readmission Risk Score     ED to Hosp-Admission (Current) from 07/03/2018 in [place] CHF  30 Day Unplanned Readmission Risk Score (%)  20 Filed at 07/08/2018 1200     This score is the patient's risk of an unplanned readmission within 30 days of being discharged (0 -100%). The score is based on dignosis, age, lab data, medications, orders, and past utilization.   Low:  0-14.9   Medium: 15-21.9   High: 22-29.9   Extreme: 30 and above       No flowsheet data found.

## 2018-07-09 ENCOUNTER — Inpatient Hospital Stay (HOSPITAL_COMMUNITY): Payer: Medicare Other

## 2018-07-09 ENCOUNTER — Encounter: Payer: Self-pay | Admitting: *Deleted

## 2018-07-09 DIAGNOSIS — Z006 Encounter for examination for normal comparison and control in clinical research program: Secondary | ICD-10-CM

## 2018-07-09 LAB — BASIC METABOLIC PANEL
Anion gap: 10 (ref 5–15)
BUN: 33 mg/dL — ABNORMAL HIGH (ref 8–23)
CO2: 32 mmol/L (ref 22–32)
Calcium: 9.5 mg/dL (ref 8.9–10.3)
Chloride: 93 mmol/L — ABNORMAL LOW (ref 98–111)
Creatinine, Ser: 2.04 mg/dL — ABNORMAL HIGH (ref 0.61–1.24)
GFR calc Af Amer: 34 mL/min — ABNORMAL LOW (ref >60)
GFR calc non Af Amer: 30 mL/min — ABNORMAL LOW (ref >60)
Glucose, Bld: 168 mg/dL — ABNORMAL HIGH (ref 70–99)
Potassium: 4 mmol/L (ref 3.5–5.1)
Sodium: 135 mmol/L (ref 135–145)

## 2018-07-09 LAB — GLUCOSE, CAPILLARY
Glucose-Capillary: 211 mg/dL — ABNORMAL HIGH (ref 70–99)
Glucose-Capillary: 169 mg/dL — ABNORMAL HIGH (ref 70–99)
Glucose-Capillary: 203 mg/dL — ABNORMAL HIGH (ref 70–99)
Glucose-Capillary: 287 mg/dL — ABNORMAL HIGH (ref 70–99)
Glucose-Capillary: 337 mg/dL — ABNORMAL HIGH (ref 70–99)

## 2018-07-09 MED ORDER — TECHNETIUM TC 99M DIETHYLENETRIAME-PENTAACETIC ACID
32.0000 | Freq: Once | INTRAVENOUS | Status: AC | PRN
  Administered 2018-07-09: 32 via RESPIRATORY_TRACT

## 2018-07-09 MED ORDER — ISOSORBIDE MONONITRATE ER 30 MG PO TB24
30.0000 mg | ORAL_TABLET | Freq: Every day | ORAL | Status: DC
  Administered 2018-07-10: 30 mg via ORAL
  Filled 2018-07-09: qty 1

## 2018-07-09 MED ORDER — IPRATROPIUM-ALBUTEROL 0.5-2.5 (3) MG/3ML IN SOLN
3.0000 mL | Freq: Two times a day (BID) | RESPIRATORY_TRACT | Status: DC
  Administered 2018-07-09 – 2018-07-10 (×2): 3 mL via RESPIRATORY_TRACT
  Filled 2018-07-09 (×3): qty 3

## 2018-07-09 MED ORDER — TORSEMIDE 20 MG PO TABS
40.0000 mg | ORAL_TABLET | Freq: Two times a day (BID) | ORAL | Status: DC
  Administered 2018-07-09: 40 mg via ORAL
  Filled 2018-07-09 (×2): qty 2

## 2018-07-09 MED ORDER — ASPIRIN EC 81 MG PO TBEC
81.0000 mg | DELAYED_RELEASE_TABLET | Freq: Every day | ORAL | Status: DC
  Administered 2018-07-09 – 2018-07-10 (×2): 81 mg via ORAL
  Filled 2018-07-09 (×2): qty 1

## 2018-07-09 MED ORDER — INSULIN ASPART 100 UNIT/ML ~~LOC~~ SOLN
5.0000 [IU] | Freq: Once | SUBCUTANEOUS | Status: AC
  Administered 2018-07-09: 5 [IU] via SUBCUTANEOUS

## 2018-07-09 MED ORDER — TECHNETIUM TO 99M ALBUMIN AGGREGATED
4.2000 | Freq: Once | INTRAVENOUS | Status: AC | PRN
  Administered 2018-07-09: 4.2 via INTRAVENOUS

## 2018-07-09 MED ORDER — SODIUM CHLORIDE 0.9 % IV SOLN
INTRAVENOUS | Status: DC
  Administered 2018-07-10: 06:00:00 via INTRAVENOUS

## 2018-07-09 NOTE — H&P (View-Only) (Signed)
Advanced Heart Failure Team Consult Note   Primary Physician: [name], MD PCP-Cardiologist:  [name], MD  Reason for Consultation: PAH  HPI:    Allen Dyer is seen today for evaluation of PAH at the request of Dr Radford Pax.   Allen Dyer is a 83 y.o. male with a history of DM, rheumatic fever, HTN, hyperlipidemia, CKD 3, prostate CA s/p XRT, new diagnosis of systolic HF due to NICM (EF 30-35%) 05/2018, PAH, and mild nonobstructive CAD.   Twenty years ago he saw a cardiologist for palpitations and was started on a betablocker with improvement. He has had high blood pressure for at least 30 years, but it has been well controlled. He checks at home and SBP 130s typically. He did not require chemo for prostate cancer, only radiation. He snores, but has never had a sleep study. Daughter checks O2 at night and has run in mid 80s for quite some time.   Followed by PCP in January with SOB and BLE edema. He had elevated BNP and CXR showed CHF. He was started on lasix with minimal improvement. Lasix increased and he was referred to cardiology.  He saw Dr Radford Pax 06/14/18. Echo showed EF 30-35%, grade 2 DD, and mod PAH. He was set up for [facility] due to ongoing symptoms and was subsequently admitted for diuresis.   Admitted 2/25-2/27/20 post cath. He was given IV lasix and required NRB post cath. He was transitioned back to home dose of lasix 40 mg daily.   He saw Melina Copa on 07/01/18 for post hospital f/u. He was taking lasix 40 mg BID and had 7 lb weight gain. Lasix was increased to 80 mg BID.   He presented to [facility] on 07[facility]03[facility]18 with worsening SOB and 15 lb weight gain. He said UOP had finally started to pick up the day prior to admission. +bloating and BLE edema. Denies CP.    Pertinent admission labs include: K 3.5, creatinine 2.16, troponin 0.04, BNP 1606, WBC 5.4, hgb 9.8  CXR 07/03/18: mild vascular congestion, stable right pleural effusion with slight improvement  He diuresed with  80 mg IV lasix BID. Diuresed 13 lbs. Last dose was 3/15. Diuretics held with AKI. Creatinine today is 2.04. SBP 100-110s.  Advanced HF team asked to see him to evaluate PAH. V/Q scan has been ordered for today.  He feels a lot better. Denies SOB or CP. Says he was off oxygen yesterday and sats were 89-91%. Now back on 2 L. His dry weight is 211-212 lbs and he is 214 lbs today.   Cardiac Studies: Echo 05/29/18: EF 30-35%, diffuse HK, mod diastolic dysfunction, RV normal, LA moderately dilated, RA mildly dilated, mild AI, mild PI, mild dilation of ascending aorta  R/LHC 06/18/18:  Prox RCA lesion is 30% stenosed.  Dist RCA-1 lesion is 20% stenosed.  Dist RCA-2 lesion is 20% stenosed.  Prox Cx lesion is 10% stenosed.  Dist LM to Ost LAD lesion is 20% stenosed.  Prox LAD lesion is 50% stenosed.  Prox LAD to Mid LAD lesion is 40% stenosed. RA: A-wave 18, V wave 18, mean 13 RV: 76/17 PA: 72/27; mean 47 PW: A-wave 31, V wave 30; mean 29 AO: 156/97 LV: 154/32 Pullback: LV: 150/36 Ao: 155/90 Oxygen saturation in the PA 59% and in the AO 91%. Fick CO: 5.9 Fick CI: 2.8 SVR: 18.8 PVR: 3.0 WU  FH: Father died with multiple MIs at age 23. Younger brother died at age 78 with heart  problems, unsure what.   SH: Former heavy smoker. Quit in 1979, but smoked 2-3 PPD for 5 years. Former heavy drinker, but quit in 1977. No drug use. He lives with his daughter. Drives and manages his own medications.    Review of systems complete and found to be negative unless listed in HPI.   Home Medications Prior to Admission medications   Medication Sig Start Date End Date Taking? Authorizing Provider  aspirin EC 81 MG tablet Take 81 mg by mouth every evening.    Yes [provider]  carvedilol (COREG) 6.25 MG tablet Take 1 tablet (6.25 mg total) by mouth 2 (two) times daily. 06/14/18 06/14/19 Yes Turner, Eber Hong, MD  Cholecalciferol (VITAMIN D3 PO) Take 1 tablet by mouth daily.    Yes [provider]  Cyanocobalamin (B-12 PO) Take 1 tablet by mouth daily.    Yes [provider]  Famotidine (PEPCID PO) Take 1 tablet by mouth 2 (two) times daily.   Yes [provider]  furosemide (LASIX) 40 MG tablet Take 2 tablets (80 mg total) by mouth 2 (two) times daily. 07/01/18 09/29/18 Yes Dunn, Dayna N, PA-C  gabapentin (NEURONTIN) 800 MG tablet Take 800 mg by mouth 2 (two) times daily.    Yes [provider]  glipiZIDE (GLUCOTROL) 10 MG tablet Take 10 mg by mouth 2 (two) times daily.    Yes [provider]  hydrALAZINE (APRESOLINE) 10 MG tablet Take 1 tablet (10 mg total) by mouth 3 (three) times daily. 06/17/18  Yes Turner, Eber Hong, MD  isosorbide mononitrate (IMDUR) 30 MG 24 hr tablet 1/2 tablet (15 mg) by mouth daily Patient taking differently: Take 15 mg by mouth daily.  06/17/18  Yes Turner, Eber Hong, MD  metFORMIN (GLUCOPHAGE) 500 MG tablet Take 500 mg by mouth 2 (two) times daily with a meal.    Yes [provider]  Multiple Vitamin (MULTIVITAMIN WITH MINERALS) TABS tablet Take 1 tablet by mouth 2 (two) times daily.   Yes [provider]  oxybutynin (DITROPAN-XL) 5 MG 24 hr tablet Take 5 mg by mouth daily.    Yes [provider]  potassium chloride (K-DUR) 10 MEQ tablet Take 10-20 mEq by mouth See admin instructions. Take 2 tablets by mouth in the morning and 1 tablet in the evening.   Yes [provider]  tamsulosin (FLOMAX) 0.4 MG CAPS capsule Take 0.4 mg by mouth 2 (two) times daily.  03/24/17  Yes [provider]  PRECISION XTRA TEST STRIPS test strip  03/10/17   [provider]    Past Medical History: Past Medical History:  Diagnosis Date  . Chronic combined systolic and diastolic CHF (congestive heart failure) ([place])   . CKD (chronic kidney disease), stage III ([place])   . Hyperlipidemia   . Hypertension   . Lower extremity edema   . Mild CAD    a. mild-mod by cath 05/2018.  Marland Kitchen NICM  (nonischemic cardiomyopathy) ([place])   . Peripheral neuropathy   . Prostate cancer ([place])    Status post XRT  . Rheumatic fever   . Trifascicular block   . Type 2 diabetes mellitus ([place])     Past Surgical History: Past Surgical History:  Procedure Laterality Date  . RIGHT/LEFT HEART CATH AND CORONARY ANGIOGRAPHY N/A 06/18/2018   Procedure: RIGHT/LEFT HEART CATH AND CORONARY ANGIOGRAPHY;  Surgeon: Troy Sine, MD;  Location: [facility];  Service: Cardiovascular;  Laterality: N/A;    Family  History: Family History  Problem Relation Age of Onset  . Congestive Heart Failure Father        died from heart failure at the age of 57  . Cancer Mother   . Cancer Sister   . Congestive Heart Failure Brother        died from heart failure at the age of 27    Social History: Social History   Socioeconomic History  . Marital status: Widowed    Spouse name: Not on file  . Number of children: Not on file  . Years of education: Not on file  . Highest education level: Not on file  Occupational History  . Not on file  Social Needs  . Financial resource strain: Not on file  . Food insecurity:    Worry: Not on file    Inability: Not on file  . Transportation needs:    Medical: Not on file    Non-medical: Not on file  Tobacco Use  . Smoking status: Never Smoker  . Smokeless tobacco: Never Used  Substance and Sexual Activity  . Alcohol use: Never    Frequency: Never  . Drug use: Never  . Sexual activity: Not on file  Lifestyle  . Physical activity:    Days per week: Not on file    Minutes per session: Not on file  . Stress: Not on file  Relationships  . Social connections:    Talks on phone: Not on file    Gets together: Not on file    Attends religious service: Not on file    Active member of club or organization: Not on file    Attends meetings of clubs or organizations: Not on file    Relationship status: Not on file  Other Topics Concern  . Not on file  Social  History Narrative  . Not on file    Allergies:  Allergies  Allergen Reactions  . Tramadol Other (See Comments)    Dizziness (intolerance)   . Finasteride Swelling    Breast enlargement   . Lisinopril Cough  . Ciprofloxacin Other (See Comments)    Dizziness (intolerance)   . Simvastatin Hives and Other (See Comments)    Blisters/ Rash   . Sulfa Antibiotics Rash    Objective:    Vital Signs:   Temp:  [98 F (36.7 C)-99.3 F (37.4 C)] 98.3 F (36.8 C) (03/17 0906) Pulse Rate:  [47-79] 70 (03/17 0906) Resp:  [12-19] 12 (03/17 0441) BP: (105-129)/(56-83) 105/56 (03/17 0906) SpO2:  [83 %-100 %] 100 % (03/17 0906) Weight:  [97 kg] 97 kg (03/17 0500) Last BM Date: 07/08/18  Weight change: Filed Weights   07/07/18 0426 07/08/18 0421 07/09/18 0500  Weight: 108 kg 97.5 kg 97 kg    Intake/Output:   Intake/Output Summary (Last 24 hours) at 07/09/2018 0907 Last data filed at 07/09/2018 0537 Gross per 24 hour  Intake -  Output 751 ml  Net -751 ml      Physical Exam    General:  Well appearing. No resp difficulty. Sitting up in chair HEENT: normal Neck: supple. JVP 6-7. Carotids 2+ bilat; no bruits. No lymphadenopathy or thyromegaly appreciated. Cor: PMI nondisplaced. Regular rate & rhythm. No rubs, gallops or murmurs. Lungs: clear, slightly diminished in LLL Abdomen: soft, nontender, nondistended. No hepatosplenomegaly. No bruits or masses. Good bowel sounds. Extremities: no cyanosis, clubbing, rash, edema. BLE TED hose.  Neuro: alert & orientedx3, cranial nerves grossly intact. moves all 4 extremities w/o difficulty.  Affect pleasant   Telemetry   NSR 60s with 1st degree AV block. 1-3 PVCs/min. Personally reviewed.   EKG    NSR 75 bpm with RBBB (166 ms) and prolonged PR interval   Labs   Basic Metabolic Panel: Recent Labs  Lab 07/03/18 1958  07/05/18 0514 07/06/18 0402 07/07/18 0712 07/08/18 0606 07/09/18 0420  NA  --    < > 143 141 137 137 135  K   --    < > 3.7 3.5 3.1* 3.6 4.0  CL  --    < > 98 99 97* 94* 93*  CO2  --    < > 33* 33* 31 33* 32  GLUCOSE  --    < > 125* 118* 134* 180* 168*  BUN  --    < > 23 25* 21 25* 33*  CREATININE 2.08*   < > 1.99* 1.84* 1.86* 2.09* 2.04*  CALCIUM  --    < > 9.4 8.9 8.5* 9.2 9.5  MG 2.2  --   --   --   --   --   --    < > = values in this interval not displayed.    Liver Function Tests: Recent Labs  Lab 07/03/18 1120  AST 40  ALT 59*  ALKPHOS 74  BILITOT 0.7  PROT 6.9  ALBUMIN 3.2*   No results for input(s): LIPASE, AMYLASE in the last 168 hours. No results for input(s): AMMONIA in the last 168 hours.  CBC: Recent Labs  Lab 07/03/18 1155 07/03/18 1958  WBC 5.4 5.5  NEUTROABS 3.9  --   HGB 9.8* 10.1*  HCT 32.9* 33.5*  MCV 96.8 96.8  PLT 194 198    Cardiac Enzymes: No results for input(s): CKTOTAL, CKMB, CKMBINDEX, TROPONINI in the last 168 hours.  BNP: BNP (last 3 results) Recent Labs    06/18/18 1625 07/03/18 1155  BNP 1,470.4* 1,606.3*    ProBNP (last 3 results) Recent Labs    06/14/18 1222 07/01/18 1233  PROBNP 2,678* 5,013*     CBG: Recent Labs  Lab 07/08/18 1203 07/08/18 1629 07/08/18 2122 07/09/18 0602 07/09/18 0641  GLUCAP 162* 158* 211* 211* 169*    Coagulation Studies: No results for input(s): LABPROT, INR in the last 72 hours.   Imaging    No results found.   Medications:     Current Medications: . carvedilol  6.25 mg Oral BID  . diclofenac sodium  2 g Topical QID  . furosemide  40 mg Intravenous Once  . heparin  5,000 Units Subcutaneous Q8H  . hydrALAZINE  25 mg Oral TID  . insulin aspart  0-9 Units Subcutaneous TID WC  . ipratropium-albuterol  3 mL Nebulization BID  . isosorbide mononitrate  15 mg Oral Daily  . oxybutynin  5 mg Oral Daily  . predniSONE  20 mg Oral Q breakfast  . tamsulosin  0.4 mg Oral BID     Infusions:     Patient Profile   Allen Dyer is a 83 y.o. male with a history of DM, rheumatic  fever, HTN, hyperlipidemia, CKD 3, prostate CA s/p XRT, new diagnosis of systolic HF due to NICM (EF 30-35%) 05/2018, PAH, and mild nonobstructive CAD.  Admitted with volume overload on 07/03/18. Diuresed with IV lasix. HF team consulted for evaluation and management of PAH.   Assessment/Plan   1. PAH - PA pressures elevated on RHC 05/2018 with PA 72/27 and PAWP 29. He was volume overloaded with RA of  13 at the time. - Echo 05/29/18 showed normal RV function - Going for VQ scan today.  - Needs outpatient sleep study.  - I think PAH is likely group 3 secondary to OSA. Could also be group 2 with acute systolic HF. Will discuss with MD, but will likely check serologies. - Will likely need home oxygen. Has qualified for home O2.   2. Acute on chronic systolic HF. New diagnosis 05/2018. Due to NICM. LHC 05/2018 with mild non-osbtructive CAD. Has had high BP for a long time, but well controlled. Could be secondary to OSA. Had palpitations in past, but very few PVCs on tele. Prior hx of ETOH abuse, but none in 40+ years. Did not receive chemo with prior prostate ca. QRS is wide on EKG 166 ms, but it is RBBB.  - Echo 05/29/18: EF 30-35%, diffuse HK, mod diastolic dysfunction, RV normal, LA moderately dilated, RA mildly dilated, mild AI, mild PI, mild dilation of ascending aorta - Volume looks okay on exam. Last dose of lasix was 3/15. - Start torsemide 40 mg BID. He was on lasix 80 mg BID PTA.  - No ACE/ARB/ARNI/dig/spiro with AKI on CKD3 - Continue hydralazine 25 mg TID + imdur 15 mg daily - Continue coreg 6.25 mg BID - He will need outpatient sleep study.   3. CKD 3 - Baseline creatinine looks to be 1.5-1.7. Reviewed in [place]. In 2017, baseline was more 2.0-2.3. - Creatinine 2.04 today. Monitor closely.   4. Mild nonobstructive CAD on [facility] 05/2018 - Not currently on statin due to intolerance (had a rash). - Restart ASA 81 mg daily. He is on at home.  - No s/s ischemia. - Check lipid panel in  am. May need referral to lipid clinic.   5. HTN - Well controlled  6. ?OSA - Needs outpatient sleep study.  7. DM - Renal function is too poor for jardiance or farxiga.   Medication concerns reviewed with patient and pharmacy team. Barriers identified: none at this time.   Length of Stay: [place], NP  07/09/2018, 9:07 AM  Advanced Heart Failure Team Pager [phone] (M-F; 7a - 4p)  Please contact [facility] for night-coverage after hours (4p -7a ) and weekends on [email or web address]  Patient seen with NP, agree with the above note.   He was admitted with acute on chronic systolic CHF.  Echo in 2/20 with EF 30-35%, normal RV.  RHC/LHC in 2/20 with elevated left and right heart filling pressures and primarily pulmonary venous hypertension.   He has diuresed well this admission with weight down > 12 lbs.  He feels like breathing is back to normal though he is still on oxygen.   On exam, JVP 8 cm.  Somewhat distant BS with slight crackles at bases.  Regular S1S2.  Trace ankle edema.   1. Acute on chronic systolic CHF: Nonischemic cardiomyopathy by cath in 2/20.  Echo in 2/20 with EF 30-35%, normal RV.  [place] in 2/20 with elevated right and left heart filling pressures and pulmonary venous hypertension.  Cardiac output was preserved.  No history of heavy ETOH or drugs.  ECG with RBBB so not likely to be a CRT candidate.  On exam, he does not look particularly volume overloaded at this point but still requiring oxygen.  - I will arrange for right heart cath tomorrow to assess PA pressure and also filling pressures given ongoing need for oxygen. We discussed risks/benefits and he agrees to procedure.  -  Continue Coreg 6.25 mg bid.   - Continue hydralazine 25 mg tid and increase Imdur to 30 mg daily.  - If creatinine stable tomorrow, add spironolactone.  - Transition from IV Lasix to torsemide 40 mg bid.  2. Pulmonary hypertension: Based on RHC in 2/20, I think he likely has primarily  pulmonary venous hypertension.  V/Q scan today showed no chronic PE.  He was a prior smoker but quit in the 1970s so significant COPD would be surprising.  - I will take him for RHC tomorrow now that he appears well-diuresed, reassess PA pressure and PVR.  Would expect significant improvement if primarily pulmonary venous hypertension.  3. AKI on CKD stage 3: Mild increase in creatinine to 2.04.  Will transition to po torsemide.   Loralie Champagne 07/09/2018 1:37 PM

## 2018-07-09 NOTE — Consult Note (Addendum)
Advanced Heart Failure Team Consult Note   Primary Physician: [name], MD PCP-Cardiologist:  [name], MD  Reason for Consultation: PAH  HPI:    Allen Dyer is seen today for evaluation of PAH at the request of Dr Radford Pax.   Allen Dyer is a 83 y.o. male with a history of DM, rheumatic fever, HTN, hyperlipidemia, CKD 3, prostate CA s/p XRT, new diagnosis of systolic HF due to NICM (EF 30-35%) 05/2018, PAH, and mild nonobstructive CAD.   Twenty years ago he saw a cardiologist for palpitations and was started on a betablocker with improvement. He has had high blood pressure for at least 30 years, but it has been well controlled. He checks at home and SBP 130s typically. He did not require chemo for prostate cancer, only radiation. He snores, but has never had a sleep study. Daughter checks O2 at night and has run in mid 80s for quite some time.   Followed by PCP in January with SOB and BLE edema. He had elevated BNP and CXR showed CHF. He was started on lasix with minimal improvement. Lasix increased and he was referred to cardiology.  He saw Dr Radford Pax 06/14/18. Echo showed EF 30-35%, grade 2 DD, and mod PAH. He was set up for [facility] due to ongoing symptoms and was subsequently admitted for diuresis.   Admitted 2/25-2/27/20 post cath. He was given IV lasix and required NRB post cath. He was transitioned back to home dose of lasix 40 mg daily.   He saw Melina Copa on 07/01/18 for post hospital f/u. He was taking lasix 40 mg BID and had 7 lb weight gain. Lasix was increased to 80 mg BID.   He presented to [facility] on 07/03/18 with worsening SOB and 15 lb weight gain. He said UOP had finally started to pick up the day prior to admission. +bloating and BLE edema. Denies CP.    Pertinent admission labs include: K 3.5, creatinine 2.16, troponin 0.04, BNP 1606, WBC 5.4, hgb 9.8  CXR 07/03/18: mild vascular congestion, stable right pleural effusion with slight improvement  He diuresed with  80 mg IV lasix BID. Diuresed 13 lbs. Last dose was 3/15. Diuretics held with AKI. Creatinine today is 2.04. SBP 100-110s.  Advanced HF team asked to see him to evaluate PAH. V/Q scan has been ordered for today.  He feels a lot better. Denies SOB or CP. Says he was off oxygen yesterday and sats were 89-91%. Now back on 2 L. His dry weight is 211-212 lbs and he is 214 lbs today.   Cardiac Studies: Echo 05/29/18: EF 30-35%, diffuse HK, mod diastolic dysfunction, RV normal, LA moderately dilated, RA mildly dilated, mild AI, mild PI, mild dilation of ascending aorta  R/LHC 06/18/18:  Prox RCA lesion is 30% stenosed.  Dist RCA-1 lesion is 20% stenosed.  Dist RCA-2 lesion is 20% stenosed.  Prox Cx lesion is 10% stenosed.  Dist LM to Ost LAD lesion is 20% stenosed.  Prox LAD lesion is 50% stenosed.  Prox LAD to Mid LAD lesion is 40% stenosed. RA: A-wave 18, V wave 18, mean 13 RV: 76/17 PA: 72/27; mean 47 PW: A-wave 31, V wave 30; mean 29 AO: 156/97 LV: 154/32 Pullback: LV: 150/36 Ao: 155/90 Oxygen saturation in the PA 59% and in the AO 91%. Fick CO: 5.9 Fick CI: 2.8 SVR: 18.8 PVR: 3.0 WU  FH: Father died with multiple MIs at age 16. Younger brother died at age 31 with heart  problems, unsure what.   SH: Former heavy smoker. Quit in 1979, but smoked 2-3 PPD for 5 years. Former heavy drinker, but quit in 1977. No drug use. He lives with his daughter. Drives and manages his own medications.    Review of systems complete and found to be negative unless listed in HPI.   Home Medications Prior to Admission medications   Medication Sig Start Date End Date Taking? Authorizing Provider  aspirin EC 81 MG tablet Take 81 mg by mouth every evening.    Yes [provider]  carvedilol (COREG) 6.25 MG tablet Take 1 tablet (6.25 mg total) by mouth 2 (two) times daily. 06/14/18 06/14/19 Yes Turner, Eber Hong, MD  Cholecalciferol (VITAMIN D3 PO) Take 1 tablet by mouth daily.    Yes [provider]  Cyanocobalamin (B-12 PO) Take 1 tablet by mouth daily.    Yes [provider]  Famotidine (PEPCID PO) Take 1 tablet by mouth 2 (two) times daily.   Yes [provider]  furosemide (LASIX) 40 MG tablet Take 2 tablets (80 mg total) by mouth 2 (two) times daily. 07/01/18 09/29/18 Yes Dunn, Dayna N, PA-C  gabapentin (NEURONTIN) 800 MG tablet Take 800 mg by mouth 2 (two) times daily.    Yes [provider]  glipiZIDE (GLUCOTROL) 10 MG tablet Take 10 mg by mouth 2 (two) times daily.    Yes [provider]  hydrALAZINE (APRESOLINE) 10 MG tablet Take 1 tablet (10 mg total) by mouth 3 (three) times daily. 06/17/18  Yes Turner, Eber Hong, MD  isosorbide mononitrate (IMDUR) 30 MG 24 hr tablet 1/2 tablet (15 mg) by mouth daily Patient taking differently: Take 15 mg by mouth daily.  06/17/18  Yes Turner, Eber Hong, MD  metFORMIN (GLUCOPHAGE) 500 MG tablet Take 500 mg by mouth 2 (two) times daily with a meal.    Yes [provider]  Multiple Vitamin (MULTIVITAMIN WITH MINERALS) TABS tablet Take 1 tablet by mouth 2 (two) times daily.   Yes [provider]  oxybutynin (DITROPAN-XL) 5 MG 24 hr tablet Take 5 mg by mouth daily.    Yes [provider]  potassium chloride (K-DUR) 10 MEQ tablet Take 10-20 mEq by mouth See admin instructions. Take 2 tablets by mouth in the morning and 1 tablet in the evening.   Yes [provider]  tamsulosin (FLOMAX) 0.4 MG CAPS capsule Take 0.4 mg by mouth 2 (two) times daily.  03/24/17  Yes [provider]  PRECISION XTRA TEST STRIPS test strip  03/10/17   [provider]    Past Medical History: Past Medical History:  Diagnosis Date  . Chronic combined systolic and diastolic CHF (congestive heart failure) ([place])   . CKD (chronic kidney disease), stage III ([place])   . Hyperlipidemia   . Hypertension   . Lower extremity edema   . Mild CAD    a. mild-mod by cath 05/2018.  Marland Kitchen NICM  (nonischemic cardiomyopathy) ([place])   . Peripheral neuropathy   . Prostate cancer ([place])    Status post XRT  . Rheumatic fever   . Trifascicular block   . Type 2 diabetes mellitus ([place])     Past Surgical History: Past Surgical History:  Procedure Laterality Date  . RIGHT/LEFT HEART CATH AND CORONARY ANGIOGRAPHY N/A 06/18/2018   Procedure: RIGHT/LEFT HEART CATH AND CORONARY ANGIOGRAPHY;  Surgeon: Troy Sine, MD;  Location: [place] [facility];  Service: Cardiovascular;  Laterality: N/A;    Family  History: Family History  Problem Relation Age of Onset  . Congestive Heart Failure Father        died from heart failure at the age of 76  . Cancer Mother   . Cancer Sister   . Congestive Heart Failure Brother        died from heart failure at the age of 32    Social History: Social History   Socioeconomic History  . Marital status: Widowed    Spouse name: Not on file  . Number of children: Not on file  . Years of education: Not on file  . Highest education level: Not on file  Occupational History  . Not on file  Social Needs  . Financial resource strain: Not on file  . Food insecurity:    Worry: Not on file    Inability: Not on file  . Transportation needs:    Medical: Not on file    Non-medical: Not on file  Tobacco Use  . Smoking status: Never Smoker  . Smokeless tobacco: Never Used  Substance and Sexual Activity  . Alcohol use: Never    Frequency: Never  . Drug use: Never  . Sexual activity: Not on file  Lifestyle  . Physical activity:    Days per week: Not on file    Minutes per session: Not on file  . Stress: Not on file  Relationships  . Social connections:    Talks on phone: Not on file    Gets together: Not on file    Attends religious service: Not on file    Active member of club or organization: Not on file    Attends meetings of clubs or organizations: Not on file    Relationship status: Not on file  Other Topics Concern  . Not on file  Social  History Narrative  . Not on file    Allergies:  Allergies  Allergen Reactions  . Tramadol Other (See Comments)    Dizziness (intolerance)   . Finasteride Swelling    Breast enlargement   . Lisinopril Cough  . Ciprofloxacin Other (See Comments)    Dizziness (intolerance)   . Simvastatin Hives and Other (See Comments)    Blisters/ Rash   . Sulfa Antibiotics Rash    Objective:    Vital Signs:   Temp:  [98 F (36.7 C)-99.3 F (37.4 C)] 98.3 F (36.8 C) (03/17 0906) Pulse Rate:  [47-79] 70 (03/17 0906) Resp:  [12-19] 12 (03/17 0441) BP: (105-129)/(56-83) 105/56 (03/17 0906) SpO2:  [83 %-100 %] 100 % (03/17 0906) Weight:  [97 kg] 97 kg (03/17 0500) Last BM Date: 07/08/18  Weight change: Filed Weights   07/07/18 0426 07/08/18 0421 07/09/18 0500  Weight: 108 kg 97.5 kg 97 kg    Intake/Output:   Intake/Output Summary (Last 24 hours) at 07/09/2018 0907 Last data filed at 07/09/2018 0537 Gross per 24 hour  Intake -  Output 751 ml  Net -751 ml      Physical Exam    General:  Well appearing. No resp difficulty. Sitting up in chair HEENT: normal Neck: supple. JVP 6-7. Carotids 2+ bilat; no bruits. No lymphadenopathy or thyromegaly appreciated. Cor: PMI nondisplaced. Regular rate & rhythm. No rubs, gallops or murmurs. Lungs: clear, slightly diminished in LLL Abdomen: soft, nontender, nondistended. No hepatosplenomegaly. No bruits or masses. Good bowel sounds. Extremities: no cyanosis, clubbing, rash, edema. BLE TED hose.  Neuro: alert & orientedx3, cranial nerves grossly intact. moves all 4 extremities w/o difficulty.  Affect pleasant   Telemetry   NSR 60s with 1st degree AV block. 1-3 PVCs/min. Personally reviewed.   EKG    NSR 75 bpm with RBBB (166 ms) and prolonged PR interval   Labs   Basic Metabolic Panel: Recent Labs  Lab 07/03/18 1958  07/05/18 0514 07/06/18 0402 07/07/18 0712 07/08/18 0606 07/09/18 0420  NA  --    < > 143 141 137 137 135  K   --    < > 3.7 3.5 3.1* 3.6 4.0  CL  --    < > 98 99 97* 94* 93*  CO2  --    < > 33* 33* 31 33* 32  GLUCOSE  --    < > 125* 118* 134* 180* 168*  BUN  --    < > 23 25* 21 25* 33*  CREATININE 2.08*   < > 1.99* 1.84* 1.86* 2.09* 2.04*  CALCIUM  --    < > 9.4 8.9 8.5* 9.2 9.5  MG 2.2  --   --   --   --   --   --    < > = values in this interval not displayed.    Liver Function Tests: Recent Labs  Lab 07/03/18 1120  AST 40  ALT 59*  ALKPHOS 74  BILITOT 0.7  PROT 6.9  ALBUMIN 3.2*   No results for input(s): LIPASE, AMYLASE in the last 168 hours. No results for input(s): AMMONIA in the last 168 hours.  CBC: Recent Labs  Lab 07/03/18 1155 07/03/18 1958  WBC 5.4 5.5  NEUTROABS 3.9  --   HGB 9.8* 10.1*  HCT 32.9* 33.5*  MCV 96.8 96.8  PLT 194 198    Cardiac Enzymes: No results for input(s): CKTOTAL, CKMB, CKMBINDEX, TROPONINI in the last 168 hours.  BNP: BNP (last 3 results) Recent Labs    06/18/18 1625 07/03/18 1155  BNP 1,470.4* 1,606.3*    ProBNP (last 3 results) Recent Labs    06/14/18 1222 07/01/18 1233  PROBNP 2,678* 5,013*     CBG: Recent Labs  Lab 07/08/18 1203 07/08/18 1629 07/08/18 2122 07/09/18 0602 07/09/18 0641  GLUCAP 162* 158* 211* 211* 169*    Coagulation Studies: No results for input(s): LABPROT, INR in the last 72 hours.   Imaging    No results found.   Medications:     Current Medications: . carvedilol  6.25 mg Oral BID  . diclofenac sodium  2 g Topical QID  . furosemide  40 mg Intravenous Once  . heparin  5,000 Units Subcutaneous Q8H  . hydrALAZINE  25 mg Oral TID  . insulin aspart  0-9 Units Subcutaneous TID WC  . ipratropium-albuterol  3 mL Nebulization BID  . isosorbide mononitrate  15 mg Oral Daily  . oxybutynin  5 mg Oral Daily  . predniSONE  20 mg Oral Q breakfast  . tamsulosin  0.4 mg Oral BID     Infusions:     Patient Profile   Jahki Witham is a 83 y.o. male with a history of DM, rheumatic  fever, HTN, hyperlipidemia, CKD 3, prostate CA s/p XRT, new diagnosis of systolic HF due to NICM (EF 30-35%) 05/2018, PAH, and mild nonobstructive CAD.  Admitted with volume overload on 07/03/18. Diuresed with IV lasix. HF team consulted for evaluation and management of PAH.   Assessment/Plan   1. PAH - PA pressures elevated on RHC 05/2018 with PA 72/27 and PAWP 29. He was volume overloaded with RA of  13 at the time. - Echo 05/29/18 showed normal RV function - Going for VQ scan today.  - Needs outpatient sleep study.  - I think PAH is likely group 3 secondary to OSA. Could also be group 2 with acute systolic HF. Will discuss with MD, but will likely check serologies. - Will likely need home oxygen. Has qualified for home O2.   2. Acute on chronic systolic HF. New diagnosis 05/2018. Due to NICM. LHC 05/2018 with mild non-osbtructive CAD. Has had high BP for a long time, but well controlled. Could be secondary to OSA. Had palpitations in past, but very few PVCs on tele. Prior hx of ETOH abuse, but none in 40+ years. Did not receive chemo with prior prostate ca. QRS is wide on EKG 166 ms, but it is RBBB.  - Echo 05/29/18: EF 30-35%, diffuse HK, mod diastolic dysfunction, RV normal, LA moderately dilated, RA mildly dilated, mild AI, mild PI, mild dilation of ascending aorta - Volume looks okay on exam. Last dose of lasix was 3/15. - Start torsemide 40 mg BID. He was on lasix 80 mg BID PTA.  - No ACE/ARB/ARNI/dig/spiro with AKI on CKD3 - Continue hydralazine 25 mg TID + imdur 15 mg daily - Continue coreg 6.25 mg BID - He will need outpatient sleep study.   3. CKD 3 - Baseline creatinine looks to be 1.5-1.7. Reviewed in [place]. In 2017, baseline was more 2.0-2.3. - Creatinine 2.04 today. Monitor closely.   4. Mild nonobstructive CAD on [facility] 05/2018 - Not currently on statin due to intolerance (had a rash). - Restart ASA 81 mg daily. He is on at home.  - No s/s ischemia. - Check lipid panel in  am. May need referral to lipid clinic.   5. HTN - Well controlled  6. ?OSA - Needs outpatient sleep study.  7. DM - Renal function is too poor for jardiance or farxiga.   Medication concerns reviewed with patient and pharmacy team. Barriers identified: none at this time.   Length of Stay: [place], NP  07/09/2018, 9:07 AM  Advanced Heart Failure Team Pager [phone] (M-F; 7a - 4p)  Please contact [facility] for night-coverage after hours (4p -7a ) and weekends on [email or web address]  Patient seen with NP, agree with the above note.   He was admitted with acute on chronic systolic CHF.  Echo in 2/20 with EF 30-35%, normal RV.  RHC/LHC in 2/20 with elevated left and right heart filling pressures and primarily pulmonary venous hypertension.   He has diuresed well this admission with weight down > 12 lbs.  He feels like breathing is back to normal though he is still on oxygen.   On exam, JVP 8 cm.  Somewhat distant BS with slight crackles at bases.  Regular S1S2.  Trace ankle edema.   1. Acute on chronic systolic CHF: Nonischemic cardiomyopathy by cath in 2/20.  Echo in 2/20 with EF 30-35%, normal RV.  [place] in 2/20 with elevated right and left heart filling pressures and pulmonary venous hypertension.  Cardiac output was preserved.  No history of heavy ETOH or drugs.  ECG with RBBB so not likely to be a CRT candidate.  On exam, he does not look particularly volume overloaded at this point but still requiring oxygen.  - I will arrange for right heart cath tomorrow to assess PA pressure and also filling pressures given ongoing need for oxygen. We discussed risks/benefits and he agrees to procedure.  -  Continue Coreg 6.25 mg bid.   - Continue hydralazine 25 mg tid and increase Imdur to 30 mg daily.  - If creatinine stable tomorrow, add spironolactone.  - Transition from IV Lasix to torsemide 40 mg bid.  2. Pulmonary hypertension: Based on RHC in 2/20, I think he likely has primarily  pulmonary venous hypertension.  V/Q scan today showed no chronic PE.  He was a prior smoker but quit in the 1970s so significant COPD would be surprising.  - I will take him for RHC tomorrow now that he appears well-diuresed, reassess PA pressure and PVR.  Would expect significant improvement if primarily pulmonary venous hypertension.  3. AKI on CKD stage 3: Mild increase in creatinine to 2.04.  Will transition to po torsemide.   Loralie Champagne 07/09/2018 1:37 PM

## 2018-07-09 NOTE — Progress Notes (Signed)
Progress Note  Patient Name: Allen Dyer Date of Encounter: 07/09/2018  Primary Cardiologist: [name], MD   Subjective   Feeling well.  Still short of breath with ambulation, though improving.  No chest pain or pressure.   Inpatient Medications    Scheduled Meds: . carvedilol  6.25 mg Oral BID  . diclofenac sodium  2 g Topical QID  . furosemide  40 mg Intravenous Once  . heparin  5,000 Units Subcutaneous Q8H  . hydrALAZINE  25 mg Oral TID  . insulin aspart  0-9 Units Subcutaneous TID WC  . ipratropium-albuterol  3 mL Nebulization BID  . isosorbide mononitrate  15 mg Oral Daily  . oxybutynin  5 mg Oral Daily  . predniSONE  20 mg Oral Q breakfast  . tamsulosin  0.4 mg Oral BID   Continuous Infusions:  PRN Meds: albuterol, diphenhydrAMINE, guaiFENesin-dextromethorphan, hydrALAZINE, Muscle Rub   Vital Signs    Vitals:   07/08/18 2036 07/08/18 2055 07/09/18 0441 07/09/18 0500  BP:  129/83 119/72   Pulse:  79 (!) 47   Resp:  18 12   Temp:  98.2 F (36.8 C) 98 F (36.7 C)   TempSrc:  Oral Oral   SpO2: 91% 92% 99%   Weight:    97 kg  Height:        Intake/Output Summary (Last 24 hours) at 07/09/2018 0848 Last data filed at 07/09/2018 0537 Gross per 24 hour  Intake -  Output 751 ml  Net -751 ml   Last 3 Weights 07/09/2018 07/08/2018 07/07/2018  Weight (lbs) 213 lb 14.4 oz 215 lb 238 lb  Weight (kg) 97.024 kg 97.523 kg 107.956 kg      Telemetry    Sinus rhythm.  PACs - Personally Reviewed  ECG    n/a - Personally Reviewed  Physical Exam   VS:  BP 119/72 (BP Location: Left Arm)   Pulse (!) 47   Temp 98 F (36.7 C) (Oral)   Resp 12   Ht 5' 7.5" (1.715 m)   Wt 97 kg Comment: scale c  SpO2 99%   BMI 33.01 kg/m  , BMI Body mass index is 33.01 kg/m. GENERAL:  Well appearing.  No acute distress HEENT: Pupils equal round and reactive, fundi not visualized, oral mucosa unremarkable NECK:  +JVP to mid neck sitting upright.  +HJR. waveform within  normal limits, carotid upstroke brisk and symmetric, no bruits LUNGS:  Clear to auscultation bilaterally HEART:  RRR.  PMI not displaced or sustained,S1 and S2 within normal limits, no S3, no S4, no clicks, no rubs, no murmurs ABD:  Flat, positive bowel sounds normal in frequency in pitch, no bruits, no rebound, no guarding, no midline pulsatile mass, no hepatomegaly, no splenomegaly EXT:  2 plus pulses throughout, 2+ pedal edema to lower tibia bilaterally.  R UE edema, no cyanosis no clubbing.  Ted hose in place SKIN:  No rashes no nodules NEURO:  Cranial nerves II through XII grossly intact, motor grossly intact throughout [facility]:  Cognitively intact, oriented to person place and time   Labs    Chemistry Recent Labs  Lab 07/03/18 1120  07/07/18 0712 07/08/18 0606 07/09/18 0420  NA 143   < > 137 137 135  K 3.5   < > 3.1* 3.6 4.0  CL 104   < > 97* 94* 93*  CO2 31   < > 31 33* 32  GLUCOSE 80   < > 134* 180* 168*  BUN 27*   < >  21 25* 33*  CREATININE 2.16*   < > 1.86* 2.09* 2.04*  CALCIUM 9.3   < > 8.5* 9.2 9.5  PROT 6.9  --   --   --   --   ALBUMIN 3.2*  --   --   --   --   AST 40  --   --   --   --   ALT 59*  --   --   --   --   ALKPHOS 74  --   --   --   --   BILITOT 0.7  --   --   --   --   GFRNONAA 28*   < > 33* 29* 30*  GFRAA 32*   < > 38* 33* 34*  ANIONGAP 8   < > 9 10 10    < > = values in this interval not displayed.     Hematology Recent Labs  Lab 07/03/18 1155 07/03/18 1958  WBC 5.4 5.5  RBC 3.40* 3.46*  HGB 9.8* 10.1*  HCT 32.9* 33.5*  MCV 96.8 96.8  MCH 28.8 29.2  MCHC 29.8* 30.1  RDW 16.1* 16.2*  PLT 194 198    Cardiac EnzymesNo results for input(s): TROPONINI in the last 168 hours.  Recent Labs  Lab 07/03/18 1140  TROPIPOC 0.04     BNP Recent Labs  Lab 07/03/18 1155  BNP 1,606.3*     DDimer No results for input(s): DDIMER in the last 168 hours.   Radiology    No results found.  Cardiac Studies   Echo 05/29/18: IMPRESSIONS   1.  The left ventricle has moderate-severely reduced systolic function of 41-32%. The cavity size was moderately increased. There is mildly increased left ventricular wall thickness. Echo evidence of pseudonormalization in diastolic relaxation Elevated  mean left atrial pressure Left ventricular diffuse hypokinesis.  2. The right ventricle has normal systolic function. The cavity was normal. There is no increase in right ventricular wall thickness. Right ventricular systolic pressure is moderately elevated with an estimated pressure of 59.3 mmHg.  3. Left atrial size was moderately dilated.  4. Right atrial size was mildly dilated.  5. The mitral valve is normal in structure.  6. The tricuspid valve is normal in structure.  7. The aortic valve is tricuspid There is mild thickening of the aortic valve. Aortic valve regurgitation is mild by color flow Doppler.  8. The pulmonic valve was normal in structure. Pulmonic valve regurgitation is mild by color flow Doppler.  9. There is mild dilatation of the ascending aorta. 10. Moderate to severe global reduction in LV systolic function; moderate diastolic dysfunction; moderate LVE; mild LVH; mildly dilated ascending aorta; biatrial enlargement; mild AI, MR and TR; moderate pulmonary hypertension.  LHC/RHC 06/18/18:  Prox RCA lesion is 30% stenosed.  Dist RCA-1 lesion is 20% stenosed.  Dist RCA-2 lesion is 20% stenosed.  Prox Cx lesion is 10% stenosed.  Dist LM to Ost LAD lesion is 20% stenosed.  Prox LAD lesion is 50% stenosed.  Prox LAD to Mid LAD lesion is 40% stenosed.   Significant right heart pressure elevation with severe pulmonary hypertension with a PA pressure of 72/27, mean 47.  Mild to moderate nonobstructive disease with mild proximal calcification of the LAD with 20% narrowing, 50% smooth narrowing after the diagonal vessel with 40% mid stenosis and a large wraparound LAD; mild 10% narrowing in the proximal circumflex; and mild  irregularity of the RCA with 30% proximal stenosis and segmental  20% distal stenoses.  RA: A-wave 18, V wave 18, mean 13 RV: 76/17 PA: 72/27; mean 47 PW: A-wave 31, V wave 30; mean 29  AO: 156/97 LV: 154/32 By the Fick method, cardiac output 5.9 liters per minute and cardiac index 2.8 L/min/m.  SVR: 18.8 PVR: 3.0 WU  Patient Profile     83 y.o. male with chronic systolic and diastolic heart failure (NICM), mild-moderate CAD on cath 05/2018, severe pulmonary hypertension, hypertension, hyperlipidemia, DM, rheumatic fever, CKD III, and prostate cancer s/p XRT here with acute on chronic heart failure  Assessment & Plan    # Acute on chronic systolic and diastolic HF: # Severe pulmonary hypertension: Creatinine worsening with diuresis.  Lasix was held yesterday and his creatinine has leveled off around 2.  His weight is now near baseline.  He continues to have a supplemental oxygen requirement.  JVP is elevated and he has pedal edema but lungs are clear.  This is consistent with elevated R sided pressure.  It is unclear why he has severe pulmonary hypertension.  I personally reviewed his echo and RV function/size are normal.  Mean PAP on cath was 47 mmHg. RV 72/27.  PVR 3.  PASP was 59 on echo.  He was volume overloaded at the time (RA 13, LVEDP 32, PCWP 29).  Left sided heart failure was likely contributing.  Continue carvedilol, hydralazine and Imdur.  Sleep study pending as an outpatient.  We will get a V/Q scan today and ask the advanced HF team to see him for consideration of pulmonary hypertension therapies.   He will need PFTs once stable.  Will defer additional rheumatologic lab work up and attempts at diuresis to their team.  # Hypertension: BP well-controlled on current regimen.  He is not on Entresto 2/2 CKD and diuresis.  # CKD III: Creatinine slightly worse as above.  Holding diuresis as above.       For questions or updates, please contact [place] Please consult  [email or web address] for contact info under        Signed, Skeet Latch, MD  07/09/2018, 8:48 AM

## 2018-07-09 NOTE — Progress Notes (Signed)
PT Cancellation Note  Patient Details Name: Alwaleed Obeso MRN: [id number] DOB: 1936/02/09   Cancelled Treatment:    Reason Eval/Treat Not Completed: Patient at procedure or test/unavailable Pt off floor in Diagnostic radiology. Will follow up as time allows.   Marguarite Arbour A Leevi Cullars 07/09/2018, 11:09 AM Wray Kearns, PT, DPT Acute Rehabilitation Services Pager [phone] Office [phone]

## 2018-07-09 NOTE — Progress Notes (Signed)
Physical Therapy Treatment Patient Details Name: Allen Dyer MRN: [id number] DOB: 22-Mar-1936 Today's Date: 07/09/2018    History of Present Illness Pt is an 83 y.o. male admitted 07/03/18 with SOB and swelling. Worked up for CHF and pulmonary hypertension. Of note, recent admission with heart cath 2/25 showing mild nonobstructive CAD. Cath planned for 3/18. PMH includes HTN, DM2, CAD.    PT Comments    Patient progressing well towards PT goals. Tolerated gait training with Min guard for balance safety. Requires use of RW for support and stability. Reports stiffness in right knee. Pt saturating at 84% on RA at rest sitting EOB. Sp02 stayed in low to mid 80s on 2-3L/min 02 despite cues for pursed lip breathing. Able to maintain Sp02 >90% on 4L/min 02 [place]. Assist to rise from EOB today. Plan for cardiac cath tomorrow. Will plan for stair training tomorrow prior to d/c. Will follow acutely.     Follow Up Recommendations  Supervision - Intermittent;Home health PT     Equipment Recommendations  Rolling walker with 5" wheels    Recommendations for Other Services       Precautions / Restrictions Precautions Precautions: Fall Restrictions Weight Bearing Restrictions: No    Mobility  Bed Mobility               General bed mobility comments: Sitting EOB upon PT arrival.   Transfers Overall transfer level: Needs assistance Equipment used: Rolling walker (2 wheeled) Transfers: Sit to/from Stand Sit to Stand: Min assist         General transfer comment: ASsist to power to standing with cues for hand placement; daughter helping pt up. Increased time to stand due to having stiff and sore knees.   Ambulation/Gait Ambulation/Gait assistance: Min guard Gait Distance (Feet): 350 Feet Assistive device: Rolling walker (2 wheeled) Gait Pattern/deviations: Step-through pattern;Decreased stride length;Wide base of support Gait velocity: Decreased   General Gait Details: Slow, steady  gait but needed RW for stability. Sp02 dropped to 78% on RA, stayed >90% on 4L/min 02. SpO2 on 2-3 L/min 02 80-85%   Stairs             Wheelchair Mobility    Modified Rankin (Stroke Patients Only)       Balance Overall balance assessment: Needs assistance Sitting-balance support: No upper extremity supported;Feet supported Sitting balance-Leahy Scale: Good     Standing balance support: Bilateral upper extremity supported;During functional activity Standing balance-Leahy Scale: Fair Standing balance comment: relies on UE support dynamically but can stand statically without device.                             Cognition Arousal/Alertness: Awake/alert Behavior During Therapy: WFL for tasks assessed/performed Overall Cognitive Status: Within Functional Limits for tasks assessed                                        Exercises      General Comments General comments (skin integrity, edema, etc.): Daughter present during session.       Pertinent Vitals/Pain Pain Assessment: No/denies pain    Home Living                      Prior Function            PT Goals (current goals can now be found in the care  plan section) Progress towards PT goals: Progressing toward goals    Frequency    Min 3X/week      PT Plan Current plan remains appropriate    Co-evaluation              AM-PAC PT "6 Clicks" Mobility   Outcome Measure  Help needed turning from your back to your side while in a flat bed without using bedrails?: None Help needed moving from lying on your back to sitting on the side of a flat bed without using bedrails?: None Help needed moving to and from a bed to a chair (including a wheelchair)?: A Little Help needed standing up from a chair using your arms (e.g., wheelchair or bedside chair)?: A Little Help needed to walk in hospital room?: A Little Help needed climbing 3-5 steps with a railing? : A Little 6  Click Score: 20    End of Session Equipment Utilized During Treatment: Gait belt;Oxygen Activity Tolerance: Patient tolerated treatment well Patient left: in chair;with call bell/phone within reach;with family/visitor present Nurse Communication: Mobility status PT Visit Diagnosis: Other abnormalities of gait and mobility (R26.89)     Time: 7092-9574 PT Time Calculation (min) (ACUTE ONLY): 30 min  Charges:  $Gait Training: 23-37 mins                     Wray Kearns, PT, DPT Acute Rehabilitation Services Pager [phone] Office [place] 07/09/2018, 3:29 PM

## 2018-07-09 NOTE — Progress Notes (Signed)
PROGRESS NOTE    Allen Dyer  OBS:[id number] DOB: Oct 02, 1935 DOA: 07/03/2018 PCP: [name], MD    Brief Narrative:  83 year old male who presented with dyspnea and edema. He does have significant past medical history for chronic systolic heart failure, type 2 diabetes mellitus, hypertension, dyslipidemia, and chronic kidney disease stage III. He had a recent hospitalization for heart failure decompensation.He reports worsening dyspnea for the last 2 weeks, associated with worsening edema and 7pound weight gain. He was seen at the outpatient cardiology clinic, his furosemide dose was increased without improvement of his symptoms. On his initial physical examination his blood pressure was 140/94, heart rate 94, respiratory rate 28, oxygen saturation 96%, on supplemental oxygen, he had moist mucous membranes, lungs with bibasilar rales, increase inspiratory effort, heart S1-S2 presentandrhythmic, abdomen soft nontender, positive pitting bilateral lower extremity edema. Sodium 143, potassium 3.5, chloride 104, bicarb 31, glucose 80, BUN 27, creatinine 2.1, BNP 1606, white count 5.4, hemoglobin 9.8, creatinine 2.9, platelets 194.His chest radiograph had cardiomegaly, bilateral interstitial infiltrates,vascular congestion and bilateral small pleural effusions. His EKG was sinus rhythm with a first-degree AV block, rate 75 bpm, left axis with left anterior fascicular block and right bundle branch block, no significant ST segment or T wave changes.  Patient was admitted to the hospitalwith theworking diagnosis of acute decompensated systolic heart failure.    Assessment & Plan:   Active Problems:   Type 2 diabetes mellitus (HCC)   CKD (chronic kidney disease), stage III (HCC)   Chronic systolic CHF (congestive heart failure) (HCC)   Essential hypertension   Pulmonary hypertension (HCC)   CHF exacerbation (HCC)   AKI (acute kidney injury) ([place])   Acute on chronic congestive  heart failure (HCC)   Acute on chronic combined systolic and diastolic CHF (congestive heart failure) ([place])  1. Acute decompensated systolic heart failurewith acute hypoxic respiratory failure.LV systolic function 47-65%, with diffuse hypokinesis. patient has achieved a negative fluid balance of -9,992 ml since admission with improvement of his symptoms. Will continue heart failure management with carvedilol,hydralazine and isosorbide. His oxymetry on room air at rest are 86 to 97%. Follow up chest film personally reviewed noted small bilateral pleural effusions and improved bilateral interstitial infiltrates. Continue diuresis.   2. AKI on CKD stage 3. Today serum creatinine stable at 2,04 with K at 4,0 and serum bicarbonate at 32. Plan to resume oral diuretics and follow up renal panel in am.   3. HTN.Continue with hydralazine and isosorbide, for after load reduction.  4. T2DM.Glucose cover and monitoring, plus insulin sliding scale.Fasting glucose this am 168 mg/dl.   5. Obesity. Calculated BMI is 33,4,. Will need outpatient follow up.   6. BPH. Continue tamsulosin no signs of urinary retention.  7. Right wrist pain (new). Will continue topical diclofenac, with good toleration, hold on further steroids.   8. Pulmonary HTN. Mean PA pressure 47 per cardiac catheterization, further work up with V/Q scan low probability for pulmonary embolism. Possible pulmonary HTN class 2 due to heart failure.   DVT prophylaxis:heparin Code Status:full Family Communication:no family at the bedside Disposition Plan/ discharge barriers:possible dc in am.  Body mass index is 33.01 kg/m. Malnutrition Type:      Malnutrition Characteristics:      Nutrition Interventions:     RN Pressure Injury Documentation:     Consultants:   Cardiology   Procedures:     Antimicrobials:       Subjective: Patient is feeling better, continue to improve dyspnea and lower  extremity edema, but not yet back to baseline. Right wrist with improved pain and mobility.   Objective: Vitals:   07/09/18 0441 07/09/18 0500 07/09/18 0906 07/09/18 1300  BP: 119/72  (!) 105/56 (!) 105/57  Pulse: (!) 47  70 65  Resp: 12     Temp: 98 F (36.7 C)  98.3 F (36.8 C) 98.1 F (36.7 C)  TempSrc: Oral  Oral Oral  SpO2: 99%  100% 97%  Weight:  97 kg    Height:        Intake/Output Summary (Last 24 hours) at 07/09/2018 1513 Last data filed at 07/09/2018 1300 Gross per 24 hour  Intake 240 ml  Output 751 ml  Net -511 ml   Filed Weights   07/07/18 0426 07/08/18 0421 07/09/18 0500  Weight: 108 kg 97.5 kg 97 kg    Examination:   General: Not in pain or dyspnea, deconditioned  Neurology: Awake and alert, non focal  E ENT: no pallor, no icterus, oral mucosa moist Cardiovascular: No JVD. S1-S2 present, rhythmic, no gallops, rubs, or murmurs. ++ pitting lower extremity edema. Pulmonary: positive breath sounds bilaterally, adequate air movement, no wheezing, rhonchi or rales. Gastrointestinal. Abdomen protuberant no organomegaly, non tender, no rebound or guarding Skin. No rashes Musculoskeletal: no joint deformities     Data Reviewed: I have personally reviewed following labs and imaging studies  CBC: Recent Labs  Lab 07/03/18 1155 07/03/18 1958  WBC 5.4 5.5  NEUTROABS 3.9  --   HGB 9.8* 10.1*  HCT 32.9* 33.5*  MCV 96.8 96.8  PLT 194 791   Basic Metabolic Panel: Recent Labs  Lab 07/03/18 1958  07/05/18 0514 07/06/18 0402 07/07/18 0712 07/08/18 0606 07/09/18 0420  NA  --    < > 143 141 137 137 135  K  --    < > 3.7 3.5 3.1* 3.6 4.0  CL  --    < > 98 99 97* 94* 93*  CO2  --    < > 33* 33* 31 33* 32  GLUCOSE  --    < > 125* 118* 134* 180* 168*  BUN  --    < > 23 25* 21 25* 33*  CREATININE 2.08*   < > 1.99* 1.84* 1.86* 2.09* 2.04*  CALCIUM  --    < > 9.4 8.9 8.5* 9.2 9.5  MG 2.2  --   --   --   --   --   --    < > = values in this interval not  displayed.   GFR: Estimated Creatinine Clearance: 31.8 mL/min (A) (by C-G formula based on SCr of 2.04 mg/dL (H)). Liver Function Tests: Recent Labs  Lab 07/03/18 1120  AST 40  ALT 59*  ALKPHOS 74  BILITOT 0.7  PROT 6.9  ALBUMIN 3.2*   No results for input(s): LIPASE, AMYLASE in the last 168 hours. No results for input(s): AMMONIA in the last 168 hours. Coagulation Profile: No results for input(s): INR, PROTIME in the last 168 hours. Cardiac Enzymes: No results for input(s): CKTOTAL, CKMB, CKMBINDEX, TROPONINI in the last 168 hours. BNP (last 3 results) Recent Labs    06/14/18 1222 07/01/18 1233  PROBNP 2,678* 5,013*   HbA1C: No results for input(s): HGBA1C in the last 72 hours. CBG: Recent Labs  Lab 07/08/18 1629 07/08/18 2122 07/09/18 0602 07/09/18 0641 07/09/18 1303  GLUCAP 158* 211* 211* 169* 203*   Lipid Profile: No results for input(s): CHOL, HDL, LDLCALC, TRIG, CHOLHDL, LDLDIRECT in  the last 72 hours. Thyroid Function Tests: No results for input(s): TSH, T4TOTAL, FREET4, T3FREE, THYROIDAB in the last 72 hours. Anemia Panel: No results for input(s): VITAMINB12, FOLATE, FERRITIN, TIBC, IRON, RETICCTPCT in the last 72 hours.    Radiology Studies: I have reviewed all of the imaging during this hospital visit personally     Scheduled Meds:  aspirin EC  81 mg Oral Daily   carvedilol  6.25 mg Oral BID   diclofenac sodium  2 g Topical QID   furosemide  40 mg Intravenous Once   heparin  5,000 Units Subcutaneous Q8H   hydrALAZINE  25 mg Oral TID   insulin aspart  0-9 Units Subcutaneous TID WC   ipratropium-albuterol  3 mL Nebulization BID   [START ON 07/10/2018] isosorbide mononitrate  30 mg Oral Daily   oxybutynin  5 mg Oral Daily   predniSONE  20 mg Oral Q breakfast   tamsulosin  0.4 mg Oral BID   torsemide  40 mg Oral BID   Continuous Infusions:   LOS: 5 days        Tamika Nou Gerome Apley, MD

## 2018-07-10 ENCOUNTER — Encounter (HOSPITAL_COMMUNITY): Payer: Self-pay | Admitting: Cardiology

## 2018-07-10 ENCOUNTER — Encounter (HOSPITAL_COMMUNITY)
Admission: EM | Disposition: A | Discharge status: Home Health | Payer: Self-pay | Source: Home / Self Care | Attending: Internal Medicine

## 2018-07-10 DIAGNOSIS — I509 Heart failure, unspecified: Secondary | ICD-10-CM

## 2018-07-10 HISTORY — PX: RIGHT HEART CATH: CATH118263

## 2018-07-10 LAB — POCT I-STAT EG7
Acid-Base Excess: 13 mmol/L — ABNORMAL HIGH (ref 0.0–2.0)
Acid-Base Excess: 14 mmol/L — ABNORMAL HIGH (ref 0.0–2.0)
Bicarbonate: 38.5 mmol/L — ABNORMAL HIGH (ref 20.0–28.0)
Bicarbonate: 38.8 mmol/L — ABNORMAL HIGH (ref 20.0–28.0)
Calcium, Ion: 1.23 mmol/L (ref 1.15–1.40)
Calcium, Ion: 1.24 mmol/L (ref 1.15–1.40)
HCT: 28 % — ABNORMAL LOW (ref 39.0–52.0)
HCT: 28 % — ABNORMAL LOW (ref 39.0–52.0)
Hemoglobin: 9.5 g/dL — ABNORMAL LOW (ref 13.0–17.0)
Hemoglobin: 9.5 g/dL — ABNORMAL LOW (ref 13.0–17.0)
O2 Saturation: 65 %
O2 Saturation: 67 %
Potassium: 3.6 mmol/L (ref 3.5–5.1)
Potassium: 3.6 mmol/L (ref 3.5–5.1)
Sodium: 139 mmol/L (ref 135–145)
Sodium: 139 mmol/L (ref 135–145)
TCO2: 40 mmol/L — ABNORMAL HIGH (ref 22–32)
TCO2: 40 mmol/L — ABNORMAL HIGH (ref 22–32)
pCO2, Ven: 51.8 mmHg (ref 44.0–60.0)
pCO2, Ven: 51.9 mmHg (ref 44.0–60.0)
pH, Ven: 7.479 — ABNORMAL HIGH (ref 7.250–7.430)
pH, Ven: 7.482 — ABNORMAL HIGH (ref 7.250–7.430)
pO2, Ven: 32 mmHg (ref 32.0–45.0)
pO2, Ven: 33 mmHg (ref 32.0–45.0)

## 2018-07-10 LAB — CBC
HCT: 30.4 % — ABNORMAL LOW (ref 39.0–52.0)
Hemoglobin: 9.6 g/dL — ABNORMAL LOW (ref 13.0–17.0)
MCH: 29.8 pg (ref 26.0–34.0)
MCHC: 31.6 g/dL (ref 30.0–36.0)
MCV: 94.4 fL (ref 80.0–100.0)
Platelets: 185 10*3/uL (ref 150–400)
RBC: 3.22 MIL/uL — ABNORMAL LOW (ref 4.22–5.81)
RDW: 15.7 % — ABNORMAL HIGH (ref 11.5–15.5)
WBC: 6.5 10*3/uL (ref 4.0–10.5)
nRBC: 0 % (ref 0.0–0.2)

## 2018-07-10 LAB — BASIC METABOLIC PANEL
Anion gap: 6 (ref 5–15)
BUN: 41 mg/dL — ABNORMAL HIGH (ref 8–23)
CO2: 38 mmol/L — ABNORMAL HIGH (ref 22–32)
Calcium: 9.5 mg/dL (ref 8.9–10.3)
Chloride: 96 mmol/L — ABNORMAL LOW (ref 98–111)
Creatinine, Ser: 2.15 mg/dL — ABNORMAL HIGH (ref 0.61–1.24)
GFR calc Af Amer: 32 mL/min — ABNORMAL LOW (ref >60)
GFR calc non Af Amer: 28 mL/min — ABNORMAL LOW (ref >60)
Glucose, Bld: 100 mg/dL — ABNORMAL HIGH (ref 70–99)
Potassium: 3.7 mmol/L (ref 3.5–5.1)
Sodium: 140 mmol/L (ref 135–145)

## 2018-07-10 LAB — GLUCOSE, CAPILLARY
Glucose-Capillary: 99 mg/dL (ref 70–99)
Glucose-Capillary: 162 mg/dL — ABNORMAL HIGH (ref 70–99)

## 2018-07-10 LAB — HEMOGLOBIN A1C
Hgb A1c MFr Bld: 6.6 % — ABNORMAL HIGH (ref 4.8–5.6)
Mean Plasma Glucose: 142.72 mg/dL

## 2018-07-10 LAB — LIPID PANEL
Cholesterol: 108 mg/dL (ref 0–200)
HDL: 32 mg/dL — ABNORMAL LOW (ref >40)
LDL Cholesterol: 62 mg/dL (ref 0–99)
Total CHOL/HDL Ratio: 3.4 RATIO
Triglycerides: 68 mg/dL (ref <150)
VLDL: 14 mg/dL (ref 0–40)

## 2018-07-10 SURGERY — RIGHT HEART CATH
Anesthesia: LOCAL

## 2018-07-10 MED ORDER — LIDOCAINE HCL (PF) 1 % IJ SOLN
INTRAMUSCULAR | Status: AC
  Filled 2018-07-10: qty 30

## 2018-07-10 MED ORDER — TORSEMIDE 20 MG PO TABS: 40.0000 mg | ORAL_TABLET | Freq: Two times a day (BID) | ORAL | Status: DC

## 2018-07-10 MED ORDER — LIDOCAINE HCL (PF) 1 % IJ SOLN
INTRAMUSCULAR | Status: DC | PRN
  Administered 2018-07-10: 2 mL via INTRADERMAL

## 2018-07-10 MED ORDER — HYDRALAZINE HCL 25 MG PO TABS: 25.0000 mg | ORAL_TABLET | Freq: Three times a day (TID) | ORAL | 3 refills | Status: DC

## 2018-07-10 MED ORDER — HEPARIN (PORCINE) IN NACL 1000-0.9 UT/500ML-% IV SOLN
INTRAVENOUS | Status: DC | PRN
  Administered 2018-07-10 (×2): 500 mL

## 2018-07-10 MED ORDER — TORSEMIDE 20 MG PO TABS
40.0000 mg | ORAL_TABLET | Freq: Two times a day (BID) | ORAL | Status: DC
  Filled 2018-07-10: qty 2

## 2018-07-10 MED ORDER — SPIRONOLACTONE 25 MG PO TABS: 12.5000 mg | ORAL_TABLET | Freq: Every day | ORAL | 2 refills | Status: DC

## 2018-07-10 MED ORDER — HEPARIN (PORCINE) IN NACL 1000-0.9 UT/500ML-% IV SOLN
INTRAVENOUS | Status: AC
  Filled 2018-07-10: qty 500

## 2018-07-10 MED ORDER — SPIRONOLACTONE 12.5 MG HALF TABLET
12.5000 mg | ORAL_TABLET | Freq: Every day | ORAL | Status: DC
  Administered 2018-07-10: 12.5 mg via ORAL
  Filled 2018-07-10: qty 1

## 2018-07-10 MED ORDER — TORSEMIDE 20 MG PO TABS: 40.0000 mg | ORAL_TABLET | Freq: Two times a day (BID) | ORAL | 2 refills | Status: DC

## 2018-07-10 MED ORDER — ISOSORBIDE MONONITRATE ER 30 MG PO TB24: 30.0000 mg | ORAL_TABLET | Freq: Every day | ORAL | 2 refills | Status: DC

## 2018-07-10 SURGICAL SUPPLY — 6 items
CATH BALLN WEDGE 5F 110CM (CATHETERS) ×2 IMPLANT
KIT HEART LEFT (KITS) ×2 IMPLANT
PACK CARDIAC CATHETERIZATION (CUSTOM PROCEDURE TRAY) ×2 IMPLANT
SHEATH GLIDE SLENDER 4/5FR (SHEATH) ×2 IMPLANT
TRANSDUCER W/STOPCOCK (MISCELLANEOUS) ×2 IMPLANT
TUBING CIL FLEX 10 FLL-RA (TUBING) ×2 IMPLANT

## 2018-07-10 NOTE — Progress Notes (Signed)
Patient down for cardiac cath.

## 2018-07-10 NOTE — Plan of Care (Signed)
  Problem: Education: Goal: Ability to demonstrate management of disease process will improve Outcome: Adequate for Discharge

## 2018-07-10 NOTE — Discharge Summary (Signed)
Physician Discharge Summary  Allen Dyer ZOX:[id number] DOB: 21-Jun-1935 DOA: 07/03/2018  PCP: [name], MD  Admit date: 07/03/2018 Discharge date: 07/10/2018  Time spent: 45* minutes  Recommendations for Outpatient Follow-up:  1. Follow-up heart failure clinic as outpatient  Discharge Diagnoses:  Active Problems:   Type 2 diabetes mellitus (HCC)   CKD (chronic kidney disease), stage III (HCC)   Chronic systolic CHF (congestive heart failure) (HCC)   Essential hypertension   Pulmonary hypertension (HCC)   CHF exacerbation (HCC)   AKI (acute kidney injury) ([place])   Acute on chronic congestive heart failure (HCC)   Acute on chronic combined systolic and diastolic CHF (congestive heart failure) ([place])   Discharge Condition: Stable  Diet recommendation: Heart healthy diet  Filed Weights   07/08/18 0421 07/09/18 0500 07/10/18 0019  Weight: 97.5 kg 97 kg 96.7 kg    History of present illness:  83 year old male with a history of chronic systolic heart failure, type 2 diabetes mellitus, hypertension, dyslipidemia, chronic kidney disease stage III came with shortness of breath.  Patient says that he had 7 pound weight gain.  Patient was seen by cardiology started on IV Lasix.  Hospital Course:   Acute on chronic systolic heart failure-improved with IV diuresis.  Net -11.9 L.  Patient was seen by cardiology and underwent right Heart cath.  Showed mild elevation of right and left heart filling pressures, moderate pulmonary venous hypertension.  Preserved cardiac output.  Dose of torsemide changed to 40 mg twice a day.Continue Imdur 30 mg p.o. daily.  Acute on chronic kidney disease stage III-today creatinine is 2.15.  Patient has been started on torsemide 40 mg p.o. twice daily. Will need BMP as outpatient at the heart failure clinic.  Hypertension-blood pressure stable, continue hydralazine, dose changed to 25 mg 3 times daily, Imdur 30 mg daily  Diabetes mellitus type 2-we will  hold metformin due to worsening renal function, continue Glucotrol.  BPH-continue tamsulosin    Procedures:  Right heart cath  Consultations:  Cardiology   Discharge Exam: Vitals:   07/10/18 0900 07/10/18 1013  BP: 125/75 124/73  Pulse: 66 61  Resp:    Temp: 98 F (36.7 C) 97.7 F (36.5 C)  SpO2: 94% 94%    General: Appears in no acute distress Cardiovascular: S1-S2, regular, no murmur auscultated Respiratory: Clear to auscultation bilaterally  Discharge Instructions   Discharge Instructions    Diet - low sodium heart healthy   Complete by:  As directed    Increase activity slowly   Complete by:  As directed      Allergies as of 07/10/2018      Reactions   Tramadol Other (See Comments)   Dizziness (intolerance)   Finasteride Swelling   Breast enlargement   Lisinopril Cough   Ciprofloxacin Other (See Comments)   Dizziness (intolerance)   Simvastatin Hives, Other (See Comments)   Blisters/ Rash   Sulfa Antibiotics Rash      Medication List    STOP taking these medications   furosemide 40 MG tablet Commonly known as:  LASIX   metFORMIN 500 MG tablet Commonly known as:  GLUCOPHAGE     TAKE these medications   aspirin EC 81 MG tablet Take 81 mg by mouth every evening.   B-12 PO Take 1 tablet by mouth daily.   carvedilol 6.25 MG tablet Commonly known as:  COREG Take 1 tablet (6.25 mg total) by mouth 2 (two) times daily.   gabapentin 800 MG tablet Commonly known  as:  NEURONTIN Take 800 mg by mouth 2 (two) times daily.   glipiZIDE 10 MG tablet Commonly known as:  GLUCOTROL Take 10 mg by mouth 2 (two) times daily.   hydrALAZINE 25 MG tablet Commonly known as:  APRESOLINE Take 1 tablet (25 mg total) by mouth 3 (three) times daily. What changed:    medication strength  how much to take   isosorbide mononitrate 30 MG 24 hr tablet Commonly known as:  IMDUR Take 1 tablet (30 mg total) by mouth daily. Start taking on:  July 11, 2018 What changed:    how much to take  how to take this  when to take this  additional instructions   multivitamin with minerals Tabs tablet Take 1 tablet by mouth 2 (two) times daily.   oxybutynin 5 MG 24 hr tablet Commonly known as:  DITROPAN-XL Take 5 mg by mouth daily.   PEPCID PO Take 1 tablet by mouth 2 (two) times daily.   potassium chloride 10 MEQ tablet Commonly known as:  K-DUR Take 10-20 mEq by mouth See admin instructions. Take 2 tablets by mouth in the morning and 1 tablet in the evening.   PRECISION XTRA TEST STRIPS test strip Generic drug:  glucose blood   spironolactone 25 MG tablet Commonly known as:  ALDACTONE Take 0.5 tablets (12.5 mg total) by mouth daily. Start taking on:  July 11, 2018   tamsulosin 0.4 MG Caps capsule Commonly known as:  FLOMAX Take 0.4 mg by mouth 2 (two) times daily.   torsemide 20 MG tablet Commonly known as:  DEMADEX Take 2 tablets (40 mg total) by mouth 2 (two) times daily.   VITAMIN D3 PO Take 1 tablet by mouth daily.            Durable Medical Equipment  (From admission, onward)         Start     Ordered   07/10/18 1258  For home use only DME Walker rolling  Once    Question:  Patient needs a walker to treat with the following condition  Answer:  CHF (congestive heart failure) ([place])   07/10/18 1258         Allergies  Allergen Reactions  . Tramadol Other (See Comments)    Dizziness (intolerance)   . Finasteride Swelling    Breast enlargement   . Lisinopril Cough  . Ciprofloxacin Other (See Comments)    Dizziness (intolerance)   . Simvastatin Hives and Other (See Comments)    Blisters/ Rash   . Sulfa Antibiotics Rash   Follow-up Information    [facility] Follow up.   Specialty:  Cardiology Contact information: [address] [id number] [place] [address] [phone]       [name], MD.   Specialty:  Family  Medicine Contact information: [place] [address] [phone]            The results of significant diagnostics from this hospitalization (including imaging, microbiology, ancillary and laboratory) are listed below for reference.    Significant Diagnostic Studies: Dg Chest 2 View  Result Date: 07/09/2018 CLINICAL DATA:  CHF EXAM: CHEST - 2 VIEW COMPARISON:  07/04/2018 FINDINGS: Bilateral diffuse interstitial thickening. Trace bilateral pleural effusions. No focal consolidation or pneumothorax. Stable cardiomegaly. No acute osseous abnormality. IMPRESSION: 1. Mild CHF. Electronically Signed   By: Kathreen Devoid   On: 07/09/2018 10:42   Dg Chest 2 View  Result Date: 07/03/2018 CLINICAL DATA:  Shortness of breath and cough for 3 weeks EXAM: CHEST - 2 VIEW COMPARISON:  07/01/2018 FINDINGS: Cardiac shadow is again enlarged but stable. Small right pleural effusion is again noted with right basilar atelectasis which has improved slightly in the interval from the prior exam. Mild vascular congestion is noted. Bibasilar atelectatic changes are seen. IMPRESSION: Slight improved aeration in the right base although persistent small effusion is noted. Electronically Signed   By: Inez Catalina M.D.   On: 07/03/2018 12:15   Dg Chest 2 View  Result Date: 07/01/2018 CLINICAL DATA:  History of recent pleural effusion and CHF. Shortness of breath. EXAM: CHEST - 2 VIEW COMPARISON:  None. FINDINGS: The heart is enlarged. There is moderate tortuosity of the thoracic aorta. Moderate central vascular congestion without overt pulmonary edema. Very small right pleural effusion. Streaky bibasilar atelectasis and/or scarring changes. No obvious infiltrates. Comparison to any prior studies would be extremely helpful. The bony thorax is intact. IMPRESSION: Cardiac enlargement and central vascular congestion without overt pulmonary edema. Small right pleural effusion and overlying  atelectasis. No obvious infiltrates. Electronically Signed   By: Marijo Sanes M.D.   On: 07/01/2018 18:18   Nm Pulmonary Perf And Vent  Result Date: 07/09/2018 CLINICAL DATA:  Shortness of breath EXAM: NUCLEAR MEDICINE VENTILATION - PERFUSION LUNG SCAN VIEWS: Anterior, posterior, left lateral, right lateral, RPO, LPO, RAO, LAO-ventilation and perfusion RADIOPHARMACEUTICALS:  32.0 mCi of Tc-91m DTPA aerosol inhalation and 4.2 mCi Tc47m MAA IV COMPARISON:  Chest radiograph July 09, 2018 FINDINGS: Ventilation: Radiotracer uptake is homogeneous and symmetric bilaterally. No ventilation defects are evident. There is cardiomegaly. Perfusion: Radiotracer uptake is homogeneous and symmetric bilaterally. No perfusion defects are evident. There is cardiomegaly. IMPRESSION: No appreciable ventilation or perfusion defects. Very low probability of pulmonary embolus. Cardiomegaly noted. Electronically Signed   By: Lowella Grip III M.D.   On: 07/09/2018 12:57   Dg Chest Port 1 View  Result Date: 07/04/2018 CLINICAL DATA:  Shortness of breath EXAM: PORTABLE CHEST 1 VIEW COMPARISON:  Yesterday FINDINGS: Cardiomegaly and vascular pedicle widening. Diffuse interstitial opacity with symmetric hazy airspace opacity at the bases. There is likely pleural effusions. No pneumothorax. IMPRESSION: CHF pattern Electronically Signed   By: Monte Fantasia M.D.   On: 07/04/2018 06:31    Microbiology: No results found for this or any previous visit (from the past 240 hour(s)).   Labs: Basic Metabolic Panel: Recent Labs  Lab 07/03/18 1958  07/06/18 0402 07/07/18 8850 07/08/18 0606 07/09/18 0420 07/10/18 0337  NA  --    < > 141 137 137 135 140  K  --    < > 3.5 3.1* 3.6 4.0 3.7  CL  --    < > 99 97* 94* 93* 96*  CO2  --    < > 33* 31 33* 32 38*  GLUCOSE  --    < > 118* 134* 180* 168* 100*  BUN  --    < > 25* 21 25* 33* 41*  CREATININE 2.08*   < > 1.84* 1.86* 2.09* 2.04* 2.15*  CALCIUM  --    < > 8.9 8.5* 9.2 9.5  9.5  MG 2.2  --   --   --   --   --   --    < > = values in this interval not displayed.   Liver Function Tests: No results for input(s): AST, ALT, ALKPHOS, BILITOT, PROT, ALBUMIN in the last 168 hours. No  results for input(s): LIPASE, AMYLASE in the last 168 hours. No results for input(s): AMMONIA in the last 168 hours. CBC: Recent Labs  Lab 07/03/18 1958 07/10/18 0337  WBC 5.5 6.5  HGB 10.1* 9.6*  HCT 33.5* 30.4*  MCV 96.8 94.4  PLT 198 185   Cardiac Enzymes: No results for input(s): CKTOTAL, CKMB, CKMBINDEX, TROPONINI in the last 168 hours. BNP: BNP (last 3 results) Recent Labs    06/18/18 1625 07/03/18 1155  BNP 1,470.4* 1,606.3*    ProBNP (last 3 results) Recent Labs    06/14/18 1222 07/01/18 1233  PROBNP 2,678* 5,013*    CBG: Recent Labs  Lab 07/09/18 1303 07/09/18 1651 07/09/18 2114 07/10/18 0631 07/10/18 1140  GLUCAP 203* 287* 337* 99 162*       Signed:  Oswald Hillock MD.  [facility] 07/10/2018, 12:59 PM

## 2018-07-10 NOTE — Interval H&P Note (Signed)
History and Physical Interval Note:  07/10/2018 10:09 AM  Allen Dyer  has presented today for surgery, with the diagnosis of pulmonary hypertention.  The various methods of treatment have been discussed with the patient and family. After consideration of risks, benefits and other options for treatment, the patient has consented to  Procedure(s): RIGHT HEART CATH (N/A) as a surgical intervention.  The patient's history has been reviewed, patient examined, no change in status, stable for surgery.  I have reviewed the patient's chart and labs.  Questions were answered to the patient's satisfaction.     Karliah Kowalchuk [employer]

## 2018-07-10 NOTE — Progress Notes (Addendum)
Advanced Heart Failure Rounding Note  PCP-Cardiologist: [name], MD   Subjective:    Started him on torsemide 40 mg BID yesterday. Received 1 dose. I/O negative 1.3 L. Weight unchanged. Creatinine trending up 2.04 -> 2.15.   Going for RHC today to reassess PA pressures now that volume seems to be stable. Thought to likely have pulmonary venous HTN.   He is now on RA with sats 92-95%. Walked with PT and was stable on RA this morning. Feels good. No SOB. Wants to go home today.   Objective:   Weight Range: 96.7 kg Body mass index is 32.9 kg/m.   Vital Signs:   Temp:  [97.7 F (36.5 C)-98.8 F (37.1 C)] 98.1 F (36.7 C) (03/18 0547) Pulse Rate:  [58-70] 61 (03/18 0547) Resp:  [16-18] 16 (03/18 0547) BP: (105-125)/(56-69) 125/69 (03/18 0547) SpO2:  [90 %-100 %] 92 % (03/18 0547) Weight:  [96.7 kg] 96.7 kg (03/18 0019) Last BM Date: 07/09/18  Weight change: Filed Weights   07/08/18 0421 07/09/18 0500 07/10/18 0019  Weight: 97.5 kg 97 kg 96.7 kg    Intake/Output:   Intake/Output Summary (Last 24 hours) at 07/10/2018 0824 Last data filed at 07/10/2018 0655 Gross per 24 hour  Intake 484.86 ml  Output 1825 ml  Net -1340.14 ml      Physical Exam    General:  Well appearing. No resp difficulty. Sitting on side of bed.  HEENT: Normal Neck: Supple. JVP 5-6. Carotids 2+ bilat; no bruits. No lymphadenopathy or thyromegaly appreciated. Cor: PMI nondisplaced. Regular rate & rhythm. No rubs, gallops or murmurs. Lungs: Clear Abdomen: Soft, nontender, nondistended. No hepatosplenomegaly. No bruits or masses. Good bowel sounds. Extremities: No cyanosis, clubbing, rash, edema Neuro: Alert & orientedx3, cranial nerves grossly intact. moves all 4 extremities w/o difficulty. Affect pleasant   Telemetry   NSR 60s with 1st degree AV block. Personally reviewed.   EKG    No new tracings.   Labs    CBC Recent Labs    07/10/18 0337  WBC 6.5  HGB 9.6*  HCT 30.4*   MCV 94.4  PLT 831   Basic Metabolic Panel Recent Labs    07/09/18 0420 07/10/18 0337  NA 135 140  K 4.0 3.7  CL 93* 96*  CO2 32 38*  GLUCOSE 168* 100*  BUN 33* 41*  CREATININE 2.04* 2.15*  CALCIUM 9.5 9.5   Liver Function Tests No results for input(s): AST, ALT, ALKPHOS, BILITOT, PROT, ALBUMIN in the last 72 hours. No results for input(s): LIPASE, AMYLASE in the last 72 hours. Cardiac Enzymes No results for input(s): CKTOTAL, CKMB, CKMBINDEX, TROPONINI in the last 72 hours.  BNP: BNP (last 3 results) Recent Labs    06/18/18 1625 07/03/18 1155  BNP 1,470.4* 1,606.3*    ProBNP (last 3 results) Recent Labs    06/14/18 1222 07/01/18 1233  PROBNP 2,678* 5,013*     D-Dimer No results for input(s): DDIMER in the last 72 hours. Hemoglobin A1C Recent Labs    07/10/18 0337  HGBA1C 6.6*   Fasting Lipid Panel Recent Labs    07/10/18 0337  CHOL 108  HDL 32*  LDLCALC 62  TRIG 68  CHOLHDL 3.4   Thyroid Function Tests No results for input(s): TSH, T4TOTAL, T3FREE, THYROIDAB in the last 72 hours.  Invalid input(s): FREET3  Other results:   Imaging    Dg Chest 2 View  Result Date: 07/09/2018 CLINICAL DATA:  CHF EXAM: CHEST - 2 VIEW  COMPARISON:  07/04/2018 FINDINGS: Bilateral diffuse interstitial thickening. Trace bilateral pleural effusions. No focal consolidation or pneumothorax. Stable cardiomegaly. No acute osseous abnormality. IMPRESSION: 1. Mild CHF. Electronically Signed   By: Kathreen Devoid   On: 07/09/2018 10:42   Nm Pulmonary Perf And Vent  Result Date: 07/09/2018 CLINICAL DATA:  Shortness of breath EXAM: NUCLEAR MEDICINE VENTILATION - PERFUSION LUNG SCAN VIEWS: Anterior, posterior, left lateral, right lateral, RPO, LPO, RAO, LAO-ventilation and perfusion RADIOPHARMACEUTICALS:  32.0 mCi of Tc-55m DTPA aerosol inhalation and 4.2 mCi Tc19m MAA IV COMPARISON:  Chest radiograph July 09, 2018 FINDINGS: Ventilation: Radiotracer uptake is homogeneous and  symmetric bilaterally. No ventilation defects are evident. There is cardiomegaly. Perfusion: Radiotracer uptake is homogeneous and symmetric bilaterally. No perfusion defects are evident. There is cardiomegaly. IMPRESSION: No appreciable ventilation or perfusion defects. Very low probability of pulmonary embolus. Cardiomegaly noted. Electronically Signed   By: Lowella Grip III M.D.   On: 07/09/2018 12:57      Medications:     Scheduled Medications: . aspirin EC  81 mg Oral Daily  . carvedilol  6.25 mg Oral BID  . diclofenac sodium  2 g Topical QID  . furosemide  40 mg Intravenous Once  . heparin  5,000 Units Subcutaneous Q8H  . hydrALAZINE  25 mg Oral TID  . insulin aspart  0-9 Units Subcutaneous TID WC  . ipratropium-albuterol  3 mL Nebulization BID  . isosorbide mononitrate  30 mg Oral Daily  . oxybutynin  5 mg Oral Daily  . predniSONE  20 mg Oral Q breakfast  . tamsulosin  0.4 mg Oral BID  . torsemide  40 mg Oral BID     Infusions: . sodium chloride 10 mL/hr at 07/10/18 0625     PRN Medications:  albuterol, diphenhydrAMINE, guaiFENesin-dextromethorphan, hydrALAZINE, Muscle Rub    Patient Profile   Allen Dyer is a 83 y.o. male with a history of DM, rheumatic fever, HTN, hyperlipidemia, CKD 3, prostate CA s/p XRT, new diagnosis of systolic HF due to NICM (EF 30-35%) 05/2018, PAH, and mild nonobstructive CAD.  Admitted with volume overload on 07/03/18. Diuresed with IV lasix. HF team consulted for evaluation and management of PAH.   Assessment/Plan   1. PAH - PA pressures elevated on RHC 05/2018 with PA 72/27 and PAWP 29. He was volume overloaded with RA of 13 at the time. PVR 3.0 - Echo 05/29/18 showed normal RV function - VQ scan negative for chronic PE.  - Needs outpatient sleep study.  - Thought to have primarily pulmonary venous HTN. Repeat RHC today to reassess PA pressures and PVR now that volume is improved.  - May need home oxygen. Has qualified for  home O2. Currently on RA and sats stable with ambulation.   2. Acute on chronic systolic HF. New diagnosis 05/2018. Due to NICM. LHC 05/2018 with mild non-osbtructive CAD. Has had high BP for a long time, but well controlled. Could be secondary to OSA. Had palpitations in past, but very few PVCs on tele. Prior hx of ETOH abuse, but none in 40+ years. Did not receive chemo with prior prostate ca. QRS is wide on EKG 166 ms, but it is RBBB.  - Echo 05/29/18: EF 30-35%, diffuse HK, mod diastolic dysfunction, RV normal, LA moderately dilated, RA mildly dilated, mild AI, mild PI, mild dilation of ascending aorta - Volume stable on exam.  - Continue torsemide 40 mg BID. Hold dose this am with mild creatinine bump. He is going for RHC  at 10 am and can reassess dosing.  - No ACE/ARB/ARNI/digwith CKD3 - Continue hydralazine 25 mg TID + imdur 30 mg daily - Continue coreg 6.25 mg BID - Add spiro 12.5 mg qHS.  - He will need outpatient sleep study.  - RHC today as above.   3. CKD 3 - Baseline creatinine looks to be 1.5-1.7. Reviewed in [place]. In 2017, baseline was more 2.0-2.3. - Creatinine 2.04 -> 2.15. Hold torsemide this am and assess pressures on RHC.   4. Mild nonobstructive CAD on [facility] 05/2018 - Not currently on statin due to intolerance (had a rash). - Restart ASA 81 mg daily. He is on at home.  - No s/s ischemia. - LDL is 62.   5. HTN - SBP 120s  6. ?OSA - Needs outpatient sleep study. No change.   7. DM - Renal function is too poor for jardiance or farxiga. No change.   Medication concerns reviewed with patient and pharmacy team. Barriers identified: none at this time.    Length of Stay: [place], NP  07/10/2018, 8:24 AM  Advanced Heart Failure Team Pager [phone] (M-F; 7a - 4p)  Please contact [facility] for night-coverage after hours (4p -7a ) and weekends on [email or web address]  Patient seen with NP, agree with the above note.   RHC done today, results  below.  RHC Procedural Findings: Hemodynamics (mmHg) RA mean 9 RV 58/13 PA 55/20, mean 32 PCWP mean 20 Oxygen saturations: PA 66% AO 97% Cardiac Output (Fick) 6.91  Cardiac Index (Fick) 3.29 PVR 1.7 WU  He is off oxygen, breathing stable.    On exam, JVP difficult. Regular S1S2, no S3.  Clear lungs.  No edema.   1. Acute on chronic systolic CHF: Nonischemic cardiomyopathy by cath in 2/20.  Echo in 2/20 with EF 30-35%, normal RV.  [place] in 2/20 with elevated right and left heart filling pressures and pulmonary venous hypertension.  No history of heavy ETOH or drugs.  ECG with RBBB so not likely to be a CRT candidate. RHC repeated today showing that filling pressures remain elevated but relatively mildly, cardiac output preserved.  Creatinine up to 2.1 with diuresis.  - Continue torsemide 40 mg bid for now.  - Continue Coreg 6.25 mg bid.   - Continue hydralazine 25 mg tid and Imdur 30 mg daily.  - Spironolactone 12.5 mg daily.  2. Pulmonary hypertension: V/Q scan today showed no chronic PE.  Pulmonary venous hypertension from LV failure based on cath today.  No selective pulmonary vasodilators. 3. AKI on CKD stage 3: Mild increase in creatinine to 2.04.  Will transition to po torsemide.   Will need followup in CHF clinic.   Loralie Champagne 07/10/2018 10:41 AM

## 2018-07-10 NOTE — Telephone Encounter (Signed)
RE: sleep study  Lauralee Evener, CMA  Freada Bergeron, CMA        Nina patient has Medicare and does not need a PA. Ok to schedule sleep study.

## 2018-07-10 NOTE — Progress Notes (Signed)
RW ordered as requested and to be delivered to the patient prior to discharging home. [place] arranged with [facility]; Jacinto Reap Burke RN,MHA,BSN [phone]

## 2018-07-10 NOTE — Clinical Social Work Note (Signed)
Readmission risk screening entered.  Allen Dyer, [name]

## 2018-07-10 NOTE — Progress Notes (Signed)
Physical Therapy Treatment Patient Details Name: Allen Dyer MRN: [id number] DOB: 1935/12/07 Today's Date: 07/10/2018    History of Present Illness Pt is an 83 y.o. male admitted 07/03/18 with SOB and swelling. Worked up for CHF and pulmonary hypertension. Of note, recent admission with heart cath 2/25 showing mild nonobstructive CAD. Cath planned for 3/18. PMH includes HTN, DM2, CAD.   PT Comments    Pt progressing with mobility. Ambulatory with RW at [place]. Pt declined stair training; reports family has moved his room to main level to avoid flight of stairs to bedroom. Pt scheduled for cardiac cath today and hopeful for return home after this. SpO2 92-94% on RA with ambulation this session; pt reports he has been using incentive spirometer frequently.   Follow Up Recommendations  Home health PT;Supervision - Intermittent     Equipment Recommendations  Rolling walker with 5" wheels    Recommendations for Other Services       Precautions / Restrictions Precautions Precautions: Fall Restrictions Weight Bearing Restrictions: No    Mobility  Bed Mobility Overal bed mobility: Modified Independent                Transfers Overall transfer level: Needs assistance Equipment used: None Transfers: Sit to/from Stand Sit to Stand: Supervision            Ambulation/Gait Ambulation/Gait assistance: Supervision Gait Distance (Feet): 400 Feet Assistive device: Rolling walker (2 wheeled) Gait Pattern/deviations: Step-through pattern;Decreased stride length;Wide base of support Gait velocity: Decreased Gait velocity interpretation: 1.31 - 2.62 ft/sec, indicative of limited community ambulator General Gait Details: Slow, steady gait with RW and supervision for safety. SpO2 92-94% on RA; no DOE noted, pt conversant throughout ambulation   Stairs Stairs: (Pt declined; reports comfort ascending 3 steps into home with assist from "my 250 lb son". Family has moved  pt's room to main floor so he will not have to ascend flight to bedroom)           Wheelchair Mobility    Modified Rankin (Stroke Patients Only)       Balance Overall balance assessment: Needs assistance Sitting-balance support: No upper extremity supported;Feet supported Sitting balance-Leahy Scale: Good     Standing balance support: Bilateral upper extremity supported;During functional activity Standing balance-Leahy Scale: Fair Standing balance comment: Can static stand and take stesps without UE support; dynamic stability improved with RW                            Cognition Arousal/Alertness: Awake/alert Behavior During Therapy: WFL for tasks assessed/performed Overall Cognitive Status: Within Functional Limits for tasks assessed                                        Exercises      General Comments        Pertinent Vitals/Pain Pain Assessment: Faces Faces Pain Scale: Hurts a little bit Pain Location: R wrist Pain Descriptors / Indicators: Sore Pain Intervention(s): Limited activity within patient's tolerance    Home Living                      Prior Function            PT Goals (current goals can now be found in the care plan section) Progress towards PT goals: Progressing toward goals    Frequency  Min 3X/week      PT Plan Current plan remains appropriate    Co-evaluation              AM-PAC PT "6 Clicks" Mobility   Outcome Measure  Help needed turning from your back to your side while in a flat bed without using bedrails?: None Help needed moving from lying on your back to sitting on the side of a flat bed without using bedrails?: None Help needed moving to and from a bed to a chair (including a wheelchair)?: None Help needed standing up from a chair using your arms (e.g., wheelchair or bedside chair)?: A Little Help needed to walk in hospital room?: A Little Help needed climbing 3-5 steps  with a railing? : A Little 6 Click Score: 21    End of Session Equipment Utilized During Treatment: Gait belt Activity Tolerance: Patient tolerated treatment well Patient left: with call bell/phone within reach(seated EOB to receive breathing treatent with RT) Nurse Communication: Mobility status PT Visit Diagnosis: Other abnormalities of gait and mobility (R26.89)     Time: 8206-0156 PT Time Calculation (min) (ACUTE ONLY): 19 min  Charges:  $Gait Training: 8-22 mins                    Mabeline Caras, PT, DPT Acute Rehabilitation Services  Pager [phone] Office [place] 07/10/2018, 8:50 AM

## 2018-07-10 NOTE — Progress Notes (Signed)
To the best of my knowledge, documentation by Truett Perna nursing student is complete and correct.

## 2018-07-10 NOTE — Telephone Encounter (Signed)
Cancelled by sleep center.

## 2018-07-10 NOTE — Telephone Encounter (Addendum)
Patient is scheduled for lab study on 09/03/18.

## 2018-07-10 NOTE — Progress Notes (Signed)
Patient ready for discharge, daughter called.

## 2018-07-11 ENCOUNTER — Ambulatory Visit: Payer: Medicare Other | Admitting: Physician Assistant

## 2018-07-12 ENCOUNTER — Encounter: Payer: Self-pay | Admitting: Podiatry

## 2018-07-12 ENCOUNTER — Ambulatory Visit (INDEPENDENT_AMBULATORY_CARE_PROVIDER_SITE_OTHER): Payer: Medicare Other | Admitting: Podiatry

## 2018-07-12 ENCOUNTER — Other Ambulatory Visit: Payer: Self-pay

## 2018-07-12 DIAGNOSIS — M79674 Pain in right toe(s): Secondary | ICD-10-CM | POA: Diagnosis not present

## 2018-07-12 DIAGNOSIS — I2510 Atherosclerotic heart disease of native coronary artery without angina pectoris: Secondary | ICD-10-CM | POA: Diagnosis not present

## 2018-07-12 DIAGNOSIS — M79675 Pain in left toe(s): Secondary | ICD-10-CM

## 2018-07-12 DIAGNOSIS — I130 Hypertensive heart and chronic kidney disease with heart failure and stage 1 through stage 4 chronic kidney disease, or unspecified chronic kidney disease: Secondary | ICD-10-CM | POA: Diagnosis not present

## 2018-07-12 DIAGNOSIS — E1142 Type 2 diabetes mellitus with diabetic polyneuropathy: Secondary | ICD-10-CM | POA: Diagnosis not present

## 2018-07-12 DIAGNOSIS — I5033 Acute on chronic diastolic (congestive) heart failure: Secondary | ICD-10-CM | POA: Diagnosis not present

## 2018-07-12 DIAGNOSIS — Z48812 Encounter for surgical aftercare following surgery on the circulatory system: Secondary | ICD-10-CM | POA: Diagnosis not present

## 2018-07-12 DIAGNOSIS — E1122 Type 2 diabetes mellitus with diabetic chronic kidney disease: Secondary | ICD-10-CM | POA: Diagnosis not present

## 2018-07-12 DIAGNOSIS — B351 Tinea unguium: Secondary | ICD-10-CM

## 2018-07-12 DIAGNOSIS — Z7984 Long term (current) use of oral hypoglycemic drugs: Secondary | ICD-10-CM | POA: Diagnosis not present

## 2018-07-12 DIAGNOSIS — C61 Malignant neoplasm of prostate: Secondary | ICD-10-CM | POA: Diagnosis not present

## 2018-07-12 DIAGNOSIS — I5043 Acute on chronic combined systolic (congestive) and diastolic (congestive) heart failure: Secondary | ICD-10-CM | POA: Diagnosis not present

## 2018-07-12 DIAGNOSIS — E785 Hyperlipidemia, unspecified: Secondary | ICD-10-CM | POA: Diagnosis not present

## 2018-07-12 DIAGNOSIS — N183 Chronic kidney disease, stage 3 (moderate): Secondary | ICD-10-CM | POA: Diagnosis not present

## 2018-07-12 DIAGNOSIS — I2729 Other secondary pulmonary hypertension: Secondary | ICD-10-CM | POA: Diagnosis not present

## 2018-07-12 DIAGNOSIS — D631 Anemia in chronic kidney disease: Secondary | ICD-10-CM | POA: Diagnosis not present

## 2018-07-12 DIAGNOSIS — I428 Other cardiomyopathies: Secondary | ICD-10-CM | POA: Diagnosis not present

## 2018-07-12 DIAGNOSIS — N400 Enlarged prostate without lower urinary tract symptoms: Secondary | ICD-10-CM | POA: Diagnosis not present

## 2018-07-12 NOTE — Progress Notes (Signed)
Complaint:  Visit Type: Patient returns to my office for continued preventative foot care services. Complaint: Patient states" my nails have grown long and thick and become painful to walk and wear shoes" Patient has been diagnosed with DM with no foot complications. The patient presents for preventative foot care services. No changes to ROS  Podiatric Exam: Vascular: dorsalis pedis and posterior tibial pulses are not  palpable bilateral. Capillary return is immediate. Temperature gradient is WNL. Skin turgor WNL  Sensorium: Normal Semmes Weinstein monofilament test. Normal tactile sensation bilaterally. Nail Exam: Pt has thick disfigured discolored nails with subungual debris noted bilateral entire nail hallux through fifth toenails Ulcer Exam: There is no evidence of ulcer or pre-ulcerative changes or infection. Orthopedic Exam: Muscle tone and strength are WNL. No limitations in general ROM. No crepitus or effusions noted. Foot type and digits show no abnormalities. Skin: No Porokeratosis. No infection or ulcers  Diagnosis:  Onychomycosis, , Pain in right toe, pain in left toes  Treatment & Plan Procedures and Treatment: Consent by patient was obtained for treatment procedures.   Debridement of mycotic and hypertrophic toenails, 1 through 5 bilateral and clearing of subungual debris. No ulceration, no infection noted. ABN signed for 2019. Return Visit-Office Procedure: Patient instructed to return to the office for a follow up visit 3 months for continued evaluation and treatment.    Gardiner Barefoot DPM

## 2018-07-15 DIAGNOSIS — D631 Anemia in chronic kidney disease: Secondary | ICD-10-CM | POA: Diagnosis not present

## 2018-07-15 DIAGNOSIS — E1122 Type 2 diabetes mellitus with diabetic chronic kidney disease: Secondary | ICD-10-CM | POA: Diagnosis not present

## 2018-07-15 DIAGNOSIS — N183 Chronic kidney disease, stage 3 (moderate): Secondary | ICD-10-CM | POA: Diagnosis not present

## 2018-07-15 DIAGNOSIS — I5043 Acute on chronic combined systolic (congestive) and diastolic (congestive) heart failure: Secondary | ICD-10-CM | POA: Diagnosis not present

## 2018-07-15 DIAGNOSIS — I130 Hypertensive heart and chronic kidney disease with heart failure and stage 1 through stage 4 chronic kidney disease, or unspecified chronic kidney disease: Secondary | ICD-10-CM | POA: Diagnosis not present

## 2018-07-15 DIAGNOSIS — I2729 Other secondary pulmonary hypertension: Secondary | ICD-10-CM | POA: Diagnosis not present

## 2018-07-16 DIAGNOSIS — E119 Type 2 diabetes mellitus without complications: Secondary | ICD-10-CM | POA: Diagnosis not present

## 2018-07-16 DIAGNOSIS — Z7984 Long term (current) use of oral hypoglycemic drugs: Secondary | ICD-10-CM | POA: Diagnosis not present

## 2018-07-16 DIAGNOSIS — N183 Chronic kidney disease, stage 3 (moderate): Secondary | ICD-10-CM | POA: Diagnosis not present

## 2018-07-16 DIAGNOSIS — I5022 Chronic systolic (congestive) heart failure: Secondary | ICD-10-CM | POA: Diagnosis not present

## 2018-07-17 DIAGNOSIS — N183 Chronic kidney disease, stage 3 (moderate): Secondary | ICD-10-CM | POA: Diagnosis not present

## 2018-07-17 DIAGNOSIS — E1122 Type 2 diabetes mellitus with diabetic chronic kidney disease: Secondary | ICD-10-CM | POA: Diagnosis not present

## 2018-07-17 DIAGNOSIS — I5043 Acute on chronic combined systolic (congestive) and diastolic (congestive) heart failure: Secondary | ICD-10-CM | POA: Diagnosis not present

## 2018-07-17 DIAGNOSIS — D631 Anemia in chronic kidney disease: Secondary | ICD-10-CM | POA: Diagnosis not present

## 2018-07-17 DIAGNOSIS — I2729 Other secondary pulmonary hypertension: Secondary | ICD-10-CM | POA: Diagnosis not present

## 2018-07-17 DIAGNOSIS — I130 Hypertensive heart and chronic kidney disease with heart failure and stage 1 through stage 4 chronic kidney disease, or unspecified chronic kidney disease: Secondary | ICD-10-CM | POA: Diagnosis not present

## 2018-07-18 ENCOUNTER — Ambulatory Visit (HOSPITAL_COMMUNITY)
Admission: RE | Admit: 2018-07-18 | Discharge: 2018-07-18 | Disposition: A | Discharge status: Home / Self Care | Payer: Medicare Other | Source: Ambulatory Visit | Attending: Cardiology | Admitting: Cardiology

## 2018-07-18 ENCOUNTER — Other Ambulatory Visit: Payer: Self-pay

## 2018-07-18 VITALS — BP 119/69 | HR 59 | Temp 97.0°F | Wt 207.0 lb

## 2018-07-18 DIAGNOSIS — I5022 Chronic systolic (congestive) heart failure: Secondary | ICD-10-CM | POA: Diagnosis not present

## 2018-07-18 DIAGNOSIS — N183 Chronic kidney disease, stage 3 unspecified: Secondary | ICD-10-CM

## 2018-07-18 DIAGNOSIS — R0683 Snoring: Secondary | ICD-10-CM

## 2018-07-18 NOTE — Progress Notes (Signed)
Appointments scheduled and AVS mailed to patient

## 2018-07-18 NOTE — Patient Instructions (Addendum)
Please request BMET from yesterday from PCP office. Need to be faxed to [phone]  Follow up telephone visit on 08/08/2018 at 10:00am   Follow up in 3 months with Dr Aundra Dubin with echo  (please call our office at [phone] to schedule)

## 2018-07-18 NOTE — Addendum Note (Signed)
Encounter addended by: Harvie Junior, CMA on: 07/18/2018 12:12 PM  Actions taken: Medication long-term status modified, Order list changed, Order Reconciliation Section accessed, Clinical Note Signed

## 2018-07-18 NOTE — Progress Notes (Addendum)
Heart Failure TeleHealth Note  Due to national recommendations of social distancing due to [place] 19, telehealth visit is felt to be most appropriate for this patient at this time.  I discussed the limitations, risks, security and privacy concerns of performing an evaluation and management service by telephone and the availability of in person appointments. I also discussed with the patient that there may be a patient responsible charge related to this service. The patient expressed understanding and agreed to proceed.   ID:  Dalessandro Baldyga, DOB [birth year], MRN [id number]  Location: Home  Provider location: [address], [place] Type of Visit: Hospital follow up  PCP:  [name], MD  Cardiologist:  [name], MD Primary HF: Dr Aundra Dubin  Chief Complaint: Chronic systolic HF   History of Present Illness: Caz Weaver a 83 y.o.malewith a history of DM, rheumatic fever, HTN, hyperlipidemia, CKD 3, prostate CA s/p XRT, new diagnosis of systolic HF due to NICM (EF 30-35%) 05/2018, pulmonary venous HTN, and mild nonobstructive CAD.  Recently admitted 3/11-3/18/20 with volume overload. Diuresed with IV lasix. HF team consulted with concern for pulmonary HTN due to ongoing oxygen need. RHC completed and showed moderate pulmonary venous HTN with normal PVR 1.7 (no PAH). Transitioned from lasix to torsemide 40 mg BID at DC. Oxygen weaned off. HF medications optimized as able. Limited by CKD. DC weight 213 lbs.   He presents via Psychiatric nurse for a telehealth visit today. Overall doing well. He has [place] Therapist, sports and PT following him through [facility]. He feels like he is getting stronger. Getting around the house with no problems. No SOB or CP with stairs. No edema, orthopnea, or PND. No cough, fever, chills, or sweats. No dizziness. Taking all medications. No problems getting them. Does not need refills. Weights stable 208 to 207 lbs since discharge on home scale. Limits fluid and salt  intake. Daughter is doing grocery shopping. He is staying at home. He had labs at PCP office yesterday and was told to stop his K supp.  Vitals taken on home equipment: Temp: 97 BP: 119/69 O2: 98% HR: 59 Weight: 207 lb  He denies symptoms of cough, fevers, chills, or new SOB worrisome for COVID 19. No sick contacts.   Past Medical History:  Diagnosis Date  . Chronic combined systolic and diastolic CHF (congestive heart failure) ([place])   . CKD (chronic kidney disease), stage III ([place])   . Hyperlipidemia   . Hypertension   . Lower extremity edema   . Mild CAD    a. mild-mod by cath 05/2018.  Marland Kitchen NICM (nonischemic cardiomyopathy) ([place])   . Peripheral neuropathy   . Prostate cancer ([place])    Status post XRT  . Rheumatic fever   . Trifascicular block   . Type 2 diabetes mellitus ([place])    Past Surgical History:  Procedure Laterality Date  . RIGHT HEART CATH N/A 07/10/2018   Procedure: RIGHT HEART CATH;  Surgeon: Larey Dresser, MD;  Location: [place] [facility];  Service: Cardiovascular;  Laterality: N/A;  . RIGHT/LEFT HEART CATH AND CORONARY ANGIOGRAPHY N/A 06/18/2018   Procedure: RIGHT/LEFT HEART CATH AND CORONARY ANGIOGRAPHY;  Surgeon: Troy Sine, MD;  Location: [place] [facility];  Service: Cardiovascular;  Laterality: N/A;     Current Outpatient Medications  Medication Sig Dispense Refill  . aspirin EC 81 MG tablet Take 81 mg by mouth every evening.     . carvedilol (COREG) 6.25 MG tablet Take 1  tablet (6.25 mg total) by mouth 2 (two) times daily. 180 tablet 3  . hydrALAZINE (APRESOLINE) 25 MG tablet Take 1 tablet (25 mg total) by mouth 3 (three) times daily. 90 tablet 3  . isosorbide mononitrate (IMDUR) 30 MG 24 hr tablet Take 1 tablet (30 mg total) by mouth daily. 30 tablet 2  . spironolactone (ALDACTONE) 25 MG tablet Take 0.5 tablets (12.5 mg total) by mouth daily. 30 tablet 2  . torsemide (DEMADEX) 20 MG tablet Take 2 tablets (40 mg total) by mouth 2 (two) times daily.  60 tablet 2  . Cholecalciferol (VITAMIN D3 PO) Take 1 tablet by mouth daily.     . Cyanocobalamin (B-12 PO) Take 1 tablet by mouth daily.     . Famotidine (PEPCID PO) Take 1 tablet by mouth 2 (two) times daily.    Marland Kitchen gabapentin (NEURONTIN) 800 MG tablet Take 800 mg by mouth 2 (two) times daily.     Marland Kitchen glipiZIDE (GLUCOTROL) 10 MG tablet Take 10 mg by mouth 2 (two) times daily.     . Multiple Vitamin (MULTIVITAMIN WITH MINERALS) TABS tablet Take 1 tablet by mouth 2 (two) times daily.    Marland Kitchen oxybutynin (DITROPAN-XL) 5 MG 24 hr tablet Take 5 mg by mouth daily.     . potassium chloride (K-DUR) 10 MEQ tablet Take 10-20 mEq by mouth See admin instructions. Take 2 tablets by mouth in the morning and 1 tablet in the evening.    Marland Kitchen PRECISION XTRA TEST STRIPS test strip     . tamsulosin (FLOMAX) 0.4 MG CAPS capsule Take 0.4 mg by mouth 2 (two) times daily.      No current facility-administered medications for this encounter.     Allergies:   Tramadol; Finasteride; Lisinopril; Ciprofloxacin; Simvastatin; and Sulfa antibiotics   Social History:  The patient  reports that he has never smoked. He has never used smokeless tobacco. He reports that he does not drink alcohol or use drugs.   Family History:  The patient's family history includes Cancer in his mother and sister; Congestive Heart Failure in his brother and father.   ROS:  Please see the history of present illness.   All other systems are personally reviewed and negative.   Exam:  [facility] Call) Lungs: Normal respiratory effort with conversation.  Neuro: Alert & oriented x 3.   Recent Labs: 07/01/2018: NT-Pro BNP 5,013 07/03/2018: ALT 59; B Natriuretic Peptide 1,606.3; Magnesium 2.2 07/10/2018: BUN 41; Creatinine, Ser 2.15; Hemoglobin 9.5; Hemoglobin 9.5; Platelets 185; Potassium 3.6; Potassium 3.6; Sodium 139; Sodium 139  Personally reviewed   Wt Readings from Last 3 Encounters:  07/18/18 93.9 kg  07/10/18 96.7 kg  07/01/18 104.8 kg       Other studies personally reviewed: Additional studies/ records that were reviewed today include: [place] 07/10/18  ASSESSMENT AND PLAN:   1. Chronic systolic HF. New diagnosis 05/2018.Due to NICM. LHC 05/2018 with mild non-osbtructive CAD.Has had high BP for a long time, but well controlled. Could be secondary to OSA. Had palpitations in past, but very few PVCs on tele. Prior hx of ETOH abuse, but none in 40+ years. Did not receive chemo with prior prostate ca. QRS is wide on EKG 166 ms, but it is RBBB. -Echo2/5/20: EF 30-35%, diffuse HK, mod diastolic dysfunction, RV normal, LA moderately dilated, RA mildly dilated, mild AI, mild PI, mild dilation of ascending aorta - RHC 07/10/18: mildly elevated filling pressures, CO preserved, pulmonary venous HTN. No PAH.  - NYHA  II. Volume sounds stable -Continue torsemide 40 mg BID. Will request labs from yesterday from PCP office  - No ACE/ARB/ARNI/digwith CKD3 - Continue hydralazine 25 mg TID + imdur 30 mg daily. Hold off on increasing with SBP 110s and CKD 3.  - Continue coreg 6.25 mg BID. HR too low to increase.  - Continue spiro 12.5 mg qHS.  - Repeat echo 3 months with Dr Aundra Dubin. Discussed possibility of EP referral for ICD if EF remains <35%.   2. CKD 3 - Baseline creatinine looks to be 1.5-1.7. Reviewed in [place]. In 2017, baseline was more 2.0-2.3. - Will request labs from PCP office (done yesterday)  3. Mild nonobstructive CAD on [facility] 05/2018 - Not currently on statin due to intolerance(had a rash). LDL 62.  -Continue ASA 81 mg daily.  - No s/s ischemia.  4. ?OSA - He has a sleep study scheduled for 5/12.   COVID screen The patient does not have any symptoms that suggest any further testing/ screening at this time.  Social distancing reinforced today.    Relevant cardiac medications were reviewed at length with the patient today.  The patient does not have concerns regarding their medications at this time.   Recommended  follow-up: 3 weeks telephone visit with me. Schedule echo in 3 months with Dr Aundra Dubin.   The following changes were made today:  None at this time. Will review labwork from PCP office before making any changes.   Labs/ tests ordered today include: None. Will request bloodwork from PCP office yesterday (Dr Dorthy Cooler)  Patient Risk: After full review of this patients clinical status, I feel that they are at moderate risk for cardiac decompensation at this time.  Today, I have spent 19 minutes with the patient with telehealth technology discussing purpose of heart failure clinic, ICD requirements, medications, and diet restrictions.    Signed, Georgiana Shore, NP  07/18/2018 11:17 AM  [facility] [address] Heart and [place] [address] [phone] (office) [phone] (fax)  Spoke to [place] and requested labs be faxed. Reported creatinine is 1.85 and K is 4.3 from labs drawn on 07/16/18. Renal function is improving. Increase spiro to 25 mg qHS. Will request repeat BMET in 7-10 days through PCP office (I believe he has repeat labs already scheduled, but will make sure). His PCP stopped his K supp earlier this week.   Georgiana Shore, NP  10:51 AM 07/19/18

## 2018-07-19 ENCOUNTER — Telehealth (HOSPITAL_COMMUNITY): Payer: Self-pay

## 2018-07-19 NOTE — Telephone Encounter (Signed)
Called patient again to discuss paln of care. No answer. Unable to leave message

## 2018-07-19 NOTE — Telephone Encounter (Signed)
Called x2, regarding changes to plan of care.  unable to leave a message. (see NP note)

## 2018-07-19 NOTE — Telephone Encounter (Signed)
-----   Message from Georgiana Shore, NP sent at 07/19/2018 10:51 AM EDT ----- Regarding: Med changes Hey guys,   I spoke to his PCP office and got lab results. They are re-faxing to [place] today. Can you have him increase his spiro to 25 mg qHS? Make sure he stays off K supp. He told me he had repeat labs at PCP office soon - can you check on that and make sure we can get results? Ideally 7-10 days after med change.  Thanks!

## 2018-07-19 NOTE — Addendum Note (Signed)
Encounter addended by: Georgiana Shore, NP on: 07/19/2018 10:51 AM  Actions taken: Clinical Note Signed

## 2018-07-22 DIAGNOSIS — D631 Anemia in chronic kidney disease: Secondary | ICD-10-CM | POA: Diagnosis not present

## 2018-07-22 DIAGNOSIS — I2729 Other secondary pulmonary hypertension: Secondary | ICD-10-CM | POA: Diagnosis not present

## 2018-07-22 DIAGNOSIS — I130 Hypertensive heart and chronic kidney disease with heart failure and stage 1 through stage 4 chronic kidney disease, or unspecified chronic kidney disease: Secondary | ICD-10-CM | POA: Diagnosis not present

## 2018-07-22 DIAGNOSIS — N183 Chronic kidney disease, stage 3 (moderate): Secondary | ICD-10-CM | POA: Diagnosis not present

## 2018-07-22 DIAGNOSIS — I5043 Acute on chronic combined systolic (congestive) and diastolic (congestive) heart failure: Secondary | ICD-10-CM | POA: Diagnosis not present

## 2018-07-22 DIAGNOSIS — E1122 Type 2 diabetes mellitus with diabetic chronic kidney disease: Secondary | ICD-10-CM | POA: Diagnosis not present

## 2018-07-22 MED ORDER — SPIRONOLACTONE 25 MG PO TABS: 25.0000 mg | ORAL_TABLET | Freq: Every day | ORAL | 6 refills | Status: DC

## 2018-07-22 NOTE — Addendum Note (Signed)
Addended by: Valeda Malm on: 07/22/2018 10:54 AM   Modules accepted: Orders

## 2018-07-22 NOTE — Telephone Encounter (Signed)
Spoke with patient regarding changes to plan of care. Per NP Tamala Julian, pt to increase Spironolactone to 25mg  (1 tab) at night.  Advised patient that he needs to repeat blood work in 1 week. Pt reports he has a MD visit at his PCP in 1 week.  Fax number provided, pt will have PCP fax results to HF clinic.  Pt reports understanding of all information.

## 2018-07-23 DIAGNOSIS — D631 Anemia in chronic kidney disease: Secondary | ICD-10-CM | POA: Diagnosis not present

## 2018-07-23 DIAGNOSIS — I2729 Other secondary pulmonary hypertension: Secondary | ICD-10-CM | POA: Diagnosis not present

## 2018-07-23 DIAGNOSIS — E1122 Type 2 diabetes mellitus with diabetic chronic kidney disease: Secondary | ICD-10-CM | POA: Diagnosis not present

## 2018-07-23 DIAGNOSIS — I5043 Acute on chronic combined systolic (congestive) and diastolic (congestive) heart failure: Secondary | ICD-10-CM | POA: Diagnosis not present

## 2018-07-23 DIAGNOSIS — I130 Hypertensive heart and chronic kidney disease with heart failure and stage 1 through stage 4 chronic kidney disease, or unspecified chronic kidney disease: Secondary | ICD-10-CM | POA: Diagnosis not present

## 2018-07-23 DIAGNOSIS — N183 Chronic kidney disease, stage 3 (moderate): Secondary | ICD-10-CM | POA: Diagnosis not present

## 2018-07-29 DIAGNOSIS — I130 Hypertensive heart and chronic kidney disease with heart failure and stage 1 through stage 4 chronic kidney disease, or unspecified chronic kidney disease: Secondary | ICD-10-CM | POA: Diagnosis not present

## 2018-07-29 DIAGNOSIS — N183 Chronic kidney disease, stage 3 (moderate): Secondary | ICD-10-CM | POA: Diagnosis not present

## 2018-07-29 DIAGNOSIS — E1122 Type 2 diabetes mellitus with diabetic chronic kidney disease: Secondary | ICD-10-CM | POA: Diagnosis not present

## 2018-07-29 DIAGNOSIS — I5043 Acute on chronic combined systolic (congestive) and diastolic (congestive) heart failure: Secondary | ICD-10-CM | POA: Diagnosis not present

## 2018-07-29 DIAGNOSIS — I2729 Other secondary pulmonary hypertension: Secondary | ICD-10-CM | POA: Diagnosis not present

## 2018-07-29 DIAGNOSIS — D631 Anemia in chronic kidney disease: Secondary | ICD-10-CM | POA: Diagnosis not present

## 2018-07-30 ENCOUNTER — Other Ambulatory Visit (HOSPITAL_COMMUNITY): Payer: Self-pay | Admitting: Cardiology

## 2018-07-30 DIAGNOSIS — I5043 Acute on chronic combined systolic (congestive) and diastolic (congestive) heart failure: Secondary | ICD-10-CM | POA: Diagnosis not present

## 2018-07-30 DIAGNOSIS — E1122 Type 2 diabetes mellitus with diabetic chronic kidney disease: Secondary | ICD-10-CM | POA: Diagnosis not present

## 2018-07-30 DIAGNOSIS — I130 Hypertensive heart and chronic kidney disease with heart failure and stage 1 through stage 4 chronic kidney disease, or unspecified chronic kidney disease: Secondary | ICD-10-CM | POA: Diagnosis not present

## 2018-07-30 DIAGNOSIS — D631 Anemia in chronic kidney disease: Secondary | ICD-10-CM | POA: Diagnosis not present

## 2018-07-30 DIAGNOSIS — I2729 Other secondary pulmonary hypertension: Secondary | ICD-10-CM | POA: Diagnosis not present

## 2018-07-30 DIAGNOSIS — N183 Chronic kidney disease, stage 3 (moderate): Secondary | ICD-10-CM | POA: Diagnosis not present

## 2018-08-05 DIAGNOSIS — D631 Anemia in chronic kidney disease: Secondary | ICD-10-CM | POA: Diagnosis not present

## 2018-08-05 DIAGNOSIS — I5043 Acute on chronic combined systolic (congestive) and diastolic (congestive) heart failure: Secondary | ICD-10-CM | POA: Diagnosis not present

## 2018-08-05 DIAGNOSIS — E1122 Type 2 diabetes mellitus with diabetic chronic kidney disease: Secondary | ICD-10-CM | POA: Diagnosis not present

## 2018-08-05 DIAGNOSIS — I2729 Other secondary pulmonary hypertension: Secondary | ICD-10-CM | POA: Diagnosis not present

## 2018-08-05 DIAGNOSIS — I130 Hypertensive heart and chronic kidney disease with heart failure and stage 1 through stage 4 chronic kidney disease, or unspecified chronic kidney disease: Secondary | ICD-10-CM | POA: Diagnosis not present

## 2018-08-05 DIAGNOSIS — N183 Chronic kidney disease, stage 3 (moderate): Secondary | ICD-10-CM | POA: Diagnosis not present

## 2018-08-06 DIAGNOSIS — I130 Hypertensive heart and chronic kidney disease with heart failure and stage 1 through stage 4 chronic kidney disease, or unspecified chronic kidney disease: Secondary | ICD-10-CM | POA: Diagnosis not present

## 2018-08-06 DIAGNOSIS — N183 Chronic kidney disease, stage 3 (moderate): Secondary | ICD-10-CM | POA: Diagnosis not present

## 2018-08-06 DIAGNOSIS — E1122 Type 2 diabetes mellitus with diabetic chronic kidney disease: Secondary | ICD-10-CM | POA: Diagnosis not present

## 2018-08-06 DIAGNOSIS — I2729 Other secondary pulmonary hypertension: Secondary | ICD-10-CM | POA: Diagnosis not present

## 2018-08-06 DIAGNOSIS — I5043 Acute on chronic combined systolic (congestive) and diastolic (congestive) heart failure: Secondary | ICD-10-CM | POA: Diagnosis not present

## 2018-08-06 DIAGNOSIS — D631 Anemia in chronic kidney disease: Secondary | ICD-10-CM | POA: Diagnosis not present

## 2018-08-06 NOTE — Research (Signed)
PHDEV Informed Consent -Late Entry                 Subject Name:   Allen Dyer   Subject met inclusion and exclusion criteria.  The informed consent form, study requirements and expectations were reviewed with the subject and questions and concerns were addressed prior to the signing of the consent form.  The subject verbalized understanding of the trial requirements.  The subject agreed to participate in the [facility] trial and signed the informed consent.  The informed consent was obtained prior to performance of any protocol-specific procedures for the subject.  A copy of the signed informed consent was given to the subject and a copy was placed in the subject's medical record. This patient was consented by Waynetta Sandy on 07-09-2018 at 16:07 p.m.   [place] Charlie Char, Research Assistant 07/09/2018 16:07 p.m.

## 2018-08-08 ENCOUNTER — Encounter (HOSPITAL_COMMUNITY): Payer: Self-pay

## 2018-08-08 ENCOUNTER — Ambulatory Visit (HOSPITAL_COMMUNITY)
Admission: RE | Admit: 2018-08-08 | Discharge: 2018-08-08 | Disposition: A | Discharge status: Home / Self Care | Payer: Medicare Other | Source: Ambulatory Visit | Attending: Cardiology | Admitting: Cardiology

## 2018-08-08 ENCOUNTER — Other Ambulatory Visit: Payer: Self-pay

## 2018-08-08 VITALS — Wt 207.0 lb

## 2018-08-08 DIAGNOSIS — N183 Chronic kidney disease, stage 3 unspecified: Secondary | ICD-10-CM

## 2018-08-08 DIAGNOSIS — I5022 Chronic systolic (congestive) heart failure: Secondary | ICD-10-CM

## 2018-08-08 DIAGNOSIS — R0683 Snoring: Secondary | ICD-10-CM

## 2018-08-08 MED ORDER — CARVEDILOL 6.25 MG PO TABS: 9.3750 mg | ORAL_TABLET | Freq: Two times a day (BID) | ORAL | 1 refills | Status: DC

## 2018-08-08 NOTE — Progress Notes (Signed)
Spoke with patient, reviewed instructions. All questions answered. AVS mailed. Scheduler to contact patient to discuss appointments.

## 2018-08-08 NOTE — Progress Notes (Signed)
Heart Failure TeleHealth Note  Due to national recommendations of social distancing due to [place] 19, Audio/video telehealth visit is felt to be most appropriate for this patient at this time.  See MyChart message from today for patient consent regarding telehealth for [facility].  Date:  08/08/2018   ID:  Allen Dyer, DOB Nov 11, 1935, MRN [id number]  Location: Home  Provider location: [place] [facility] Type of Visit: Established patient   PCP:  [name], MD  Cardiologist:  [name], MD Primary HF: Dr Aundra Dubin  Chief Complaint: HF follow up   History of Present Illness: Allen Dyer a 83 y.o.malewith a history of DM, rheumatic fever, HTN, hyperlipidemia, CKD 3, prostate CA s/p XRT, new diagnosis of systolic HF due to NICM (EF 30-35%) 05/2018, pulmonary venous HTN, and mild nonobstructive CAD.  Recently admitted 3/11-3/18/20 with volume overload. Diuresed with IV lasix. HF team consulted with concern for pulmonary HTN due to ongoing oxygen need. RHC completed and showed moderate pulmonary venous HTN with normal PVR 1.7 (no PAH). Transitioned from lasix to torsemide 40 mg BID at DC. Oxygen weaned off. HF medications optimized as able. Limited by CKD. DC weight 213 lbs.   He presents via Engineer, civil (consulting) for a telehealth visit today. Last visit spiro was increased. He had recheck labs through [facility] Overall doing well. He has rare SOB that happens when he first starts moving around in the mornings. Improves throughout the day and with use of IS. Denies edema, orthopnea, or PND. No bloating. Appetite and energy level good. He had one episode of dizziness when he stood up quickly. None otherwise. Good UOP on torsemide. No CP. Weight stable 207 lb. Daughter is doing the grocery shopping and is limiting his fluid and salt. Taking all medications. Followed by [facility].  Vitals taken by patient today: BP 132/75 HR 76 O2 95-98% Weight 207  lbs  he denies symptoms worrisome for COVID 19.   Past Medical History:  Diagnosis Date   Chronic combined systolic and diastolic CHF (congestive heart failure) (HCC)    CKD (chronic kidney disease), stage III (HCC)    Hyperlipidemia    Hypertension    Lower extremity edema    Mild CAD    a. mild-mod by cath 05/2018.   NICM (nonischemic cardiomyopathy) ([place])    Peripheral neuropathy    Prostate cancer ([place])    Status post XRT   Rheumatic fever    Trifascicular block    Type 2 diabetes mellitus [facility])    Past Surgical History:  Procedure Laterality Date   RIGHT HEART CATH N/A 07/10/2018   Procedure: RIGHT HEART CATH;  Surgeon: Larey Dresser, MD;  Location: [place] CV LAB;  Service: Cardiovascular;  Laterality: N/A;   RIGHT/LEFT HEART CATH AND CORONARY ANGIOGRAPHY N/A 06/18/2018   Procedure: RIGHT/LEFT HEART CATH AND CORONARY ANGIOGRAPHY;  Surgeon: Troy Sine, MD;  Location: [place] CV LAB;  Service: Cardiovascular;  Laterality: N/A;     Current Outpatient Medications  Medication Sig Dispense Refill   aspirin EC 81 MG tablet Take 81 mg by mouth every evening.      carvedilol (COREG) 6.25 MG tablet Take 1 tablet (6.25 mg total) by mouth 2 (two) times daily. 180 tablet 3   hydrALAZINE (APRESOLINE) 25 MG tablet Take 1 tablet (25 mg total) by mouth 3 (three) times daily. 90 tablet 3   isosorbide mononitrate (IMDUR) 30 MG 24 hr tablet Take 1 tablet (30 mg  total) by mouth daily. 30 tablet 2   spironolactone (ALDACTONE) 25 MG tablet Take 1 tablet (25 mg total) by mouth at bedtime. 30 tablet 6   torsemide (DEMADEX) 20 MG tablet Take 2 tablets (40 mg total) by mouth 2 (two) times daily. 60 tablet 2   Cholecalciferol (VITAMIN D3 PO) Take 1 tablet by mouth daily.      Cyanocobalamin (B-12 PO) Take 1 tablet by mouth daily.      Famotidine (PEPCID PO) Take 1 tablet by mouth 2 (two) times daily.     gabapentin (NEURONTIN) 800 MG tablet Take 800 mg by mouth  2 (two) times daily.      glipiZIDE (GLUCOTROL) 10 MG tablet Take 10 mg by mouth 2 (two) times daily.      Multiple Vitamin (MULTIVITAMIN WITH MINERALS) TABS tablet Take 1 tablet by mouth 2 (two) times daily.     oxybutynin (DITROPAN-XL) 5 MG 24 hr tablet Take 5 mg by mouth daily.      PRECISION XTRA TEST STRIPS test strip      tamsulosin (FLOMAX) 0.4 MG CAPS capsule Take 0.4 mg by mouth 2 (two) times daily.      No current facility-administered medications for this encounter.     Allergies:   Tramadol; Finasteride; Lisinopril; Ciprofloxacin; Simvastatin; and Sulfa antibiotics   Social History:  The patient  reports that he has never smoked. He has never used smokeless tobacco. He reports that he does not drink alcohol or use drugs.   Family History:  The patient's family history includes Cancer in his mother and sister; Congestive Heart Failure in his brother and father.   ROS:  Please see the history of present illness.   All other systems are personally reviewed and negative.   Exam:  (Video/Tele Health Call; Exam is subjective and or/visual.) General:  Speaks in full sentences. No resp difficulty. Lungs: Normal respiratory effort with conversation.  Abdomen: Non-distended per patient report Extremities: Pt denies edema. Neuro: Alert & oriented x 3.   Recent Labs: 07/01/2018: NT-Pro BNP 5,013 07/03/2018: ALT 59; B Natriuretic Peptide 1,606.3; Magnesium 2.2 07/10/2018: BUN 41; Creatinine, Ser 2.15; Hemoglobin 9.5; Hemoglobin 9.5; Platelets 185; Potassium 3.6; Potassium 3.6; Sodium 139; Sodium 139  Personally reviewed   Wt Readings from Last 3 Encounters:  08/08/18 93.9 kg (207 lb)  07/18/18 93.9 kg (207 lb)  07/10/18 96.7 kg (213 lb 3.2 oz)      ASSESSMENT AND PLAN:  1. Chronic systolic HF. New diagnosis 05/2018.Due to NICM. LHC 05/2018 with mild non-osbtructive CAD.Has had high BP for a long time, but well controlled. Could be secondary to OSA. Had palpitations in past,  but very few PVCs on tele. Prior hx of ETOH abuse, but none in 40+ years. Did not receive chemo with prior prostate ca. QRS is wide on EKG 166 ms, but it is RBBB. -Echo2/5/20: EF 30-35%, diffuse HK, mod diastolic dysfunction, RV normal, LA moderately dilated, RA mildly dilated, mild AI, mild PI, mild dilation of ascending aorta - RHC 07/10/18: mildly elevated filling pressures, CO preserved, pulmonary venous HTN. No PAH.  - NYHA II. Volume sounds stable -Continuetorsemide 40 mg BID.  - No ACE/ARB/ARNI/digwith CKD3 - Continue hydralazine 25 mg TID + imdur30mg  daily.  - Increase coreg to 9.375 mg BID. - Continue spiro 25 mg qHS.Will request labs from PCP office from last week  - Repeat echo 3 months with Dr Aundra Dubin. Discussed possibility of EP referral for ICD if EF remains <35%.  2. CKD 3 - Baseline creatinine looks to be 1.5-1.7. Reviewed in [place]. In 2017, baseline was more 2.0-2.3. - Creatinine 1.85, K 4.3 on 07/16/18.  - Will request labs from [place].   3. Mild nonobstructive CAD on [facility] 05/2018 - Not currently on statin due to intolerance(had a rash). LDL 62.  -Continue ASA 81 mg daily.  -No s/s ischemia  4. ?OSA - He has a sleep study scheduled for 5/12. No change.    COVID screen The patient does not have any symptoms that suggest any further testing/ screening at this time.  Social distancing reinforced today.  The following changes were made today:  Increase coreg to 9.375 mg BID. Request labs from [facility] [place].  Recommended follow-up: Follow up in 6 weeks with APP. Echo in 3 months with Dr Aundra Dubin  Today, I have spent 14 minutes with the patient with telehealth technology discussing the above issues .    Signed, Georgiana Shore, NP  08/08/2018 10:06 AM  [place] [address] [phone] (office) [phone] (fax)

## 2018-08-08 NOTE — Addendum Note (Signed)
Encounter addended by: Valeda Malm, RN on: 08/08/2018 10:38 AM  Actions taken: Medication long-term status modified, Pharmacy for encounter modified, Order list changed, Diagnosis association updated, Clinical Note Signed

## 2018-08-08 NOTE — Patient Instructions (Addendum)
1. Increase coreg to 9.375 mg twice a day   2. Follow up in 6 weeks with Nurse Practitioners.   3. Follow up in 3 months with Dr Aundra Dubin with echo   Your physician has requested that you have an echocardiogram. Echocardiography is a painless test that uses sound waves to create images of your heart. It provides your doctor with information about the size and shape of your heart and how well your heart's chambers and valves are working. This procedure takes approximately one hour. There are no restrictions for this procedure.

## 2018-08-11 DIAGNOSIS — I428 Other cardiomyopathies: Secondary | ICD-10-CM | POA: Diagnosis not present

## 2018-08-11 DIAGNOSIS — I5043 Acute on chronic combined systolic (congestive) and diastolic (congestive) heart failure: Secondary | ICD-10-CM | POA: Diagnosis not present

## 2018-08-11 DIAGNOSIS — C61 Malignant neoplasm of prostate: Secondary | ICD-10-CM | POA: Diagnosis not present

## 2018-08-11 DIAGNOSIS — E1142 Type 2 diabetes mellitus with diabetic polyneuropathy: Secondary | ICD-10-CM | POA: Diagnosis not present

## 2018-08-11 DIAGNOSIS — I2510 Atherosclerotic heart disease of native coronary artery without angina pectoris: Secondary | ICD-10-CM | POA: Diagnosis not present

## 2018-08-11 DIAGNOSIS — Z48812 Encounter for surgical aftercare following surgery on the circulatory system: Secondary | ICD-10-CM | POA: Diagnosis not present

## 2018-08-11 DIAGNOSIS — I5033 Acute on chronic diastolic (congestive) heart failure: Secondary | ICD-10-CM | POA: Diagnosis not present

## 2018-08-11 DIAGNOSIS — N400 Enlarged prostate without lower urinary tract symptoms: Secondary | ICD-10-CM | POA: Diagnosis not present

## 2018-08-11 DIAGNOSIS — N183 Chronic kidney disease, stage 3 (moderate): Secondary | ICD-10-CM | POA: Diagnosis not present

## 2018-08-11 DIAGNOSIS — E785 Hyperlipidemia, unspecified: Secondary | ICD-10-CM | POA: Diagnosis not present

## 2018-08-11 DIAGNOSIS — Z7984 Long term (current) use of oral hypoglycemic drugs: Secondary | ICD-10-CM | POA: Diagnosis not present

## 2018-08-11 DIAGNOSIS — E1122 Type 2 diabetes mellitus with diabetic chronic kidney disease: Secondary | ICD-10-CM | POA: Diagnosis not present

## 2018-08-11 DIAGNOSIS — I2729 Other secondary pulmonary hypertension: Secondary | ICD-10-CM | POA: Diagnosis not present

## 2018-08-11 DIAGNOSIS — I130 Hypertensive heart and chronic kidney disease with heart failure and stage 1 through stage 4 chronic kidney disease, or unspecified chronic kidney disease: Secondary | ICD-10-CM | POA: Diagnosis not present

## 2018-08-11 DIAGNOSIS — D631 Anemia in chronic kidney disease: Secondary | ICD-10-CM | POA: Diagnosis not present

## 2018-08-13 ENCOUNTER — Other Ambulatory Visit (HOSPITAL_COMMUNITY): Payer: Self-pay | Admitting: Cardiology

## 2018-08-13 DIAGNOSIS — N183 Chronic kidney disease, stage 3 (moderate): Secondary | ICD-10-CM | POA: Diagnosis not present

## 2018-08-13 DIAGNOSIS — E1122 Type 2 diabetes mellitus with diabetic chronic kidney disease: Secondary | ICD-10-CM | POA: Diagnosis not present

## 2018-08-13 DIAGNOSIS — I2729 Other secondary pulmonary hypertension: Secondary | ICD-10-CM | POA: Diagnosis not present

## 2018-08-13 DIAGNOSIS — I130 Hypertensive heart and chronic kidney disease with heart failure and stage 1 through stage 4 chronic kidney disease, or unspecified chronic kidney disease: Secondary | ICD-10-CM | POA: Diagnosis not present

## 2018-08-13 DIAGNOSIS — D631 Anemia in chronic kidney disease: Secondary | ICD-10-CM | POA: Diagnosis not present

## 2018-08-13 DIAGNOSIS — I5043 Acute on chronic combined systolic (congestive) and diastolic (congestive) heart failure: Secondary | ICD-10-CM | POA: Diagnosis not present

## 2018-08-14 DIAGNOSIS — I5043 Acute on chronic combined systolic (congestive) and diastolic (congestive) heart failure: Secondary | ICD-10-CM | POA: Diagnosis not present

## 2018-08-14 DIAGNOSIS — I2729 Other secondary pulmonary hypertension: Secondary | ICD-10-CM | POA: Diagnosis not present

## 2018-08-14 DIAGNOSIS — I130 Hypertensive heart and chronic kidney disease with heart failure and stage 1 through stage 4 chronic kidney disease, or unspecified chronic kidney disease: Secondary | ICD-10-CM | POA: Diagnosis not present

## 2018-08-14 DIAGNOSIS — E1122 Type 2 diabetes mellitus with diabetic chronic kidney disease: Secondary | ICD-10-CM | POA: Diagnosis not present

## 2018-08-14 DIAGNOSIS — N183 Chronic kidney disease, stage 3 (moderate): Secondary | ICD-10-CM | POA: Diagnosis not present

## 2018-08-14 DIAGNOSIS — D631 Anemia in chronic kidney disease: Secondary | ICD-10-CM | POA: Diagnosis not present

## 2018-08-19 DIAGNOSIS — D631 Anemia in chronic kidney disease: Secondary | ICD-10-CM | POA: Diagnosis not present

## 2018-08-19 DIAGNOSIS — N183 Chronic kidney disease, stage 3 (moderate): Secondary | ICD-10-CM | POA: Diagnosis not present

## 2018-08-19 DIAGNOSIS — I130 Hypertensive heart and chronic kidney disease with heart failure and stage 1 through stage 4 chronic kidney disease, or unspecified chronic kidney disease: Secondary | ICD-10-CM | POA: Diagnosis not present

## 2018-08-19 DIAGNOSIS — I5043 Acute on chronic combined systolic (congestive) and diastolic (congestive) heart failure: Secondary | ICD-10-CM | POA: Diagnosis not present

## 2018-08-19 DIAGNOSIS — E1122 Type 2 diabetes mellitus with diabetic chronic kidney disease: Secondary | ICD-10-CM | POA: Diagnosis not present

## 2018-08-19 DIAGNOSIS — I2729 Other secondary pulmonary hypertension: Secondary | ICD-10-CM | POA: Diagnosis not present

## 2018-08-20 DIAGNOSIS — I5043 Acute on chronic combined systolic (congestive) and diastolic (congestive) heart failure: Secondary | ICD-10-CM | POA: Diagnosis not present

## 2018-08-20 DIAGNOSIS — D631 Anemia in chronic kidney disease: Secondary | ICD-10-CM | POA: Diagnosis not present

## 2018-08-20 DIAGNOSIS — E1122 Type 2 diabetes mellitus with diabetic chronic kidney disease: Secondary | ICD-10-CM | POA: Diagnosis not present

## 2018-08-20 DIAGNOSIS — N183 Chronic kidney disease, stage 3 (moderate): Secondary | ICD-10-CM | POA: Diagnosis not present

## 2018-08-20 DIAGNOSIS — I130 Hypertensive heart and chronic kidney disease with heart failure and stage 1 through stage 4 chronic kidney disease, or unspecified chronic kidney disease: Secondary | ICD-10-CM | POA: Diagnosis not present

## 2018-08-20 DIAGNOSIS — I2729 Other secondary pulmonary hypertension: Secondary | ICD-10-CM | POA: Diagnosis not present

## 2018-08-22 DIAGNOSIS — N183 Chronic kidney disease, stage 3 (moderate): Secondary | ICD-10-CM | POA: Diagnosis not present

## 2018-08-22 DIAGNOSIS — Z7984 Long term (current) use of oral hypoglycemic drugs: Secondary | ICD-10-CM | POA: Diagnosis not present

## 2018-08-22 DIAGNOSIS — I5022 Chronic systolic (congestive) heart failure: Secondary | ICD-10-CM | POA: Diagnosis not present

## 2018-08-22 DIAGNOSIS — E119 Type 2 diabetes mellitus without complications: Secondary | ICD-10-CM | POA: Diagnosis not present

## 2018-08-22 DIAGNOSIS — N63 Unspecified lump in unspecified breast: Secondary | ICD-10-CM | POA: Diagnosis not present

## 2018-08-26 DIAGNOSIS — D631 Anemia in chronic kidney disease: Secondary | ICD-10-CM | POA: Diagnosis not present

## 2018-08-26 DIAGNOSIS — E1122 Type 2 diabetes mellitus with diabetic chronic kidney disease: Secondary | ICD-10-CM | POA: Diagnosis not present

## 2018-08-26 DIAGNOSIS — I5043 Acute on chronic combined systolic (congestive) and diastolic (congestive) heart failure: Secondary | ICD-10-CM | POA: Diagnosis not present

## 2018-08-26 DIAGNOSIS — I2729 Other secondary pulmonary hypertension: Secondary | ICD-10-CM | POA: Diagnosis not present

## 2018-08-26 DIAGNOSIS — N183 Chronic kidney disease, stage 3 (moderate): Secondary | ICD-10-CM | POA: Diagnosis not present

## 2018-08-26 DIAGNOSIS — I130 Hypertensive heart and chronic kidney disease with heart failure and stage 1 through stage 4 chronic kidney disease, or unspecified chronic kidney disease: Secondary | ICD-10-CM | POA: Diagnosis not present

## 2018-08-27 DIAGNOSIS — I130 Hypertensive heart and chronic kidney disease with heart failure and stage 1 through stage 4 chronic kidney disease, or unspecified chronic kidney disease: Secondary | ICD-10-CM | POA: Diagnosis not present

## 2018-08-27 DIAGNOSIS — I5043 Acute on chronic combined systolic (congestive) and diastolic (congestive) heart failure: Secondary | ICD-10-CM | POA: Diagnosis not present

## 2018-08-27 DIAGNOSIS — N183 Chronic kidney disease, stage 3 (moderate): Secondary | ICD-10-CM | POA: Diagnosis not present

## 2018-08-27 DIAGNOSIS — D631 Anemia in chronic kidney disease: Secondary | ICD-10-CM | POA: Diagnosis not present

## 2018-08-27 DIAGNOSIS — I2729 Other secondary pulmonary hypertension: Secondary | ICD-10-CM | POA: Diagnosis not present

## 2018-08-27 DIAGNOSIS — E1122 Type 2 diabetes mellitus with diabetic chronic kidney disease: Secondary | ICD-10-CM | POA: Diagnosis not present

## 2018-09-02 DIAGNOSIS — N183 Chronic kidney disease, stage 3 (moderate): Secondary | ICD-10-CM | POA: Diagnosis not present

## 2018-09-02 DIAGNOSIS — I130 Hypertensive heart and chronic kidney disease with heart failure and stage 1 through stage 4 chronic kidney disease, or unspecified chronic kidney disease: Secondary | ICD-10-CM | POA: Diagnosis not present

## 2018-09-02 DIAGNOSIS — D631 Anemia in chronic kidney disease: Secondary | ICD-10-CM | POA: Diagnosis not present

## 2018-09-02 DIAGNOSIS — I5043 Acute on chronic combined systolic (congestive) and diastolic (congestive) heart failure: Secondary | ICD-10-CM | POA: Diagnosis not present

## 2018-09-02 DIAGNOSIS — I2729 Other secondary pulmonary hypertension: Secondary | ICD-10-CM | POA: Diagnosis not present

## 2018-09-02 DIAGNOSIS — E1122 Type 2 diabetes mellitus with diabetic chronic kidney disease: Secondary | ICD-10-CM | POA: Diagnosis not present

## 2018-09-02 NOTE — Addendum Note (Signed)
Addended by: Freada Bergeron on: 09/02/2018 02:03 PM   Modules accepted: Orders

## 2018-09-03 ENCOUNTER — Encounter (HOSPITAL_BASED_OUTPATIENT_CLINIC_OR_DEPARTMENT_OTHER): Payer: Medicare Other

## 2018-09-03 ENCOUNTER — Ambulatory Visit (HOSPITAL_BASED_OUTPATIENT_CLINIC_OR_DEPARTMENT_OTHER): Payer: Medicare Other | Attending: Cardiology | Admitting: Cardiology

## 2018-09-03 ENCOUNTER — Other Ambulatory Visit: Payer: Self-pay

## 2018-09-03 VITALS — Ht 67.0 in | Wt 207.0 lb

## 2018-09-03 DIAGNOSIS — N183 Chronic kidney disease, stage 3 (moderate): Secondary | ICD-10-CM | POA: Diagnosis not present

## 2018-09-03 DIAGNOSIS — G4733 Obstructive sleep apnea (adult) (pediatric): Secondary | ICD-10-CM

## 2018-09-03 DIAGNOSIS — I2510 Atherosclerotic heart disease of native coronary artery without angina pectoris: Secondary | ICD-10-CM | POA: Diagnosis not present

## 2018-09-03 DIAGNOSIS — I10 Essential (primary) hypertension: Secondary | ICD-10-CM | POA: Diagnosis not present

## 2018-09-03 DIAGNOSIS — E1122 Type 2 diabetes mellitus with diabetic chronic kidney disease: Secondary | ICD-10-CM | POA: Diagnosis not present

## 2018-09-03 DIAGNOSIS — I129 Hypertensive chronic kidney disease with stage 1 through stage 4 chronic kidney disease, or unspecified chronic kidney disease: Secondary | ICD-10-CM | POA: Diagnosis not present

## 2018-09-03 DIAGNOSIS — I00 Rheumatic fever without heart involvement: Secondary | ICD-10-CM | POA: Diagnosis not present

## 2018-09-03 DIAGNOSIS — N189 Chronic kidney disease, unspecified: Secondary | ICD-10-CM | POA: Diagnosis not present

## 2018-09-03 DIAGNOSIS — N2581 Secondary hyperparathyroidism of renal origin: Secondary | ICD-10-CM | POA: Diagnosis not present

## 2018-09-03 DIAGNOSIS — I5020 Unspecified systolic (congestive) heart failure: Secondary | ICD-10-CM | POA: Diagnosis not present

## 2018-09-03 DIAGNOSIS — N390 Urinary tract infection, site not specified: Secondary | ICD-10-CM | POA: Diagnosis not present

## 2018-09-03 DIAGNOSIS — C61 Malignant neoplasm of prostate: Secondary | ICD-10-CM | POA: Diagnosis not present

## 2018-09-03 DIAGNOSIS — D631 Anemia in chronic kidney disease: Secondary | ICD-10-CM | POA: Diagnosis not present

## 2018-09-03 DIAGNOSIS — E785 Hyperlipidemia, unspecified: Secondary | ICD-10-CM | POA: Diagnosis not present

## 2018-09-07 DIAGNOSIS — D631 Anemia in chronic kidney disease: Secondary | ICD-10-CM | POA: Diagnosis not present

## 2018-09-07 DIAGNOSIS — E1122 Type 2 diabetes mellitus with diabetic chronic kidney disease: Secondary | ICD-10-CM | POA: Diagnosis not present

## 2018-09-07 DIAGNOSIS — I2729 Other secondary pulmonary hypertension: Secondary | ICD-10-CM | POA: Diagnosis not present

## 2018-09-07 DIAGNOSIS — I130 Hypertensive heart and chronic kidney disease with heart failure and stage 1 through stage 4 chronic kidney disease, or unspecified chronic kidney disease: Secondary | ICD-10-CM | POA: Diagnosis not present

## 2018-09-07 DIAGNOSIS — I5043 Acute on chronic combined systolic (congestive) and diastolic (congestive) heart failure: Secondary | ICD-10-CM | POA: Diagnosis not present

## 2018-09-07 DIAGNOSIS — N183 Chronic kidney disease, stage 3 (moderate): Secondary | ICD-10-CM | POA: Diagnosis not present

## 2018-09-08 NOTE — Procedures (Signed)
   Patient Name: Allen Dyer, Allen Dyer Study Date:04/10/2017 09/03/2018 Gender: Male D.O.B: 04-May-1935 Age (years): 29 Referring Provider: Melina Copa Height (inches): 31 Interpreting Physician: [name] MD, ABSM Weight (lbs): 207 RPSGT: Zadie Rhine BMI: 32 MRN: [id number] Neck Size: 17.00  CLINICAL INFORMATION Sleep Study Type: NPSG  Indication for sleep study: Congestive Heart Failure, Hypertension  Epworth Sleepiness Score: 4  SLEEP STUDY TECHNIQUE As per the AASM Manual for the Scoring of Sleep and Associated Events v2.3 (April 2016) with a hypopnea requiring 4% desaturations.  The channels recorded and monitored were frontal, central and occipital EEG, electrooculogram (EOG), submentalis EMG (chin), nasal and oral airflow, thoracic and abdominal wall motion, anterior tibialis EMG, snore microphone, electrocardiogram, and pulse oximetry.  MEDICATIONS Medications self-administered by patient taken the night of the study : [place] The study was initiated at 10:23:34 PM and ended at 4:36:51 AM.  Sleep onset time was 16.4 minutes and the sleep efficiency was 72.4%. The total sleep time was 270.4 minutes.  Stage REM latency was 138.5 minutes.  The patient spent 3.9% of the night in stage N1 sleep, 84.1% in stage N2 sleep, 0.9% in stage N3 and 11.1% in REM.  Alpha intrusion was absent.  Supine sleep was 56.02%.  RESPIRATORY PARAMETERS The overall apnea/hypopnea index (AHI) was 59.2 per hour. There were 23 total apneas, including 22 obstructive, 1 central and 0 mixed apneas. There were 244 hypopneas and 2 RERAs.  The AHI during Stage REM sleep was 48.0 per hour.  AHI while supine was 74.5 per hour.  The mean oxygen saturation was 89.4%. The minimum SpO2 during sleep was 63.0%.  soft snoring was noted during this study.  CARDIAC DATA The 2 lead EKG demonstrated sinus rhythm. The mean heart rate was 65.4 beats per minute. Other EKG findings include:  None.  LEG MOVEMENT DATA The total PLMS were 0 with a resulting PLMS index of 0.0. Associated arousal with leg movement index was 0.0 .  IMPRESSIONS - Severe obstructive sleep apnea occurred during this study (AHI = 59.2/h). - No significant central sleep apnea occurred during this study (CAI = 0.2/h). - Severe oxygen desaturation was noted during this study (Min O2 = 63.0%). - The patient snored with soft snoring volume. - No cardiac abnormalities were noted during this study. - Clinically significant periodic limb movements did not occur during sleep. No significant associated arousals.  DIAGNOSIS - Obstructive Sleep Apnea (327.23 [G47.33 ICD-10]) - Nocturnal Hypoxemia (327.26 [G47.36 ICD-10])  RECOMMENDATIONS - Therapeutic CPAP titration to determine optimal pressure required to alleviate sleep disordered breathing. - Positional therapy avoiding supine position during sleep. - Avoid alcohol, sedatives and other CNS depressants that may worsen sleep apnea and disrupt normal sleep architecture. - Sleep hygiene should be reviewed to assess factors that may improve sleep quality. - Weight management and regular exercise should be initiated or continued if appropriate.  [Electronically signed] 09/08/2018 10:35 PM  [name] MD, ABSM Diplomate, American Board of Sleep Medicine

## 2018-09-09 DIAGNOSIS — I83893 Varicose veins of bilateral lower extremities with other complications: Secondary | ICD-10-CM | POA: Diagnosis not present

## 2018-09-09 DIAGNOSIS — I872 Venous insufficiency (chronic) (peripheral): Secondary | ICD-10-CM | POA: Diagnosis not present

## 2018-09-10 DIAGNOSIS — I2729 Other secondary pulmonary hypertension: Secondary | ICD-10-CM | POA: Diagnosis not present

## 2018-09-10 DIAGNOSIS — I5033 Acute on chronic diastolic (congestive) heart failure: Secondary | ICD-10-CM | POA: Diagnosis not present

## 2018-09-10 DIAGNOSIS — E785 Hyperlipidemia, unspecified: Secondary | ICD-10-CM | POA: Diagnosis not present

## 2018-09-10 DIAGNOSIS — Z7984 Long term (current) use of oral hypoglycemic drugs: Secondary | ICD-10-CM | POA: Diagnosis not present

## 2018-09-10 DIAGNOSIS — Z48812 Encounter for surgical aftercare following surgery on the circulatory system: Secondary | ICD-10-CM | POA: Diagnosis not present

## 2018-09-10 DIAGNOSIS — E1142 Type 2 diabetes mellitus with diabetic polyneuropathy: Secondary | ICD-10-CM | POA: Diagnosis not present

## 2018-09-10 DIAGNOSIS — I130 Hypertensive heart and chronic kidney disease with heart failure and stage 1 through stage 4 chronic kidney disease, or unspecified chronic kidney disease: Secondary | ICD-10-CM | POA: Diagnosis not present

## 2018-09-10 DIAGNOSIS — E1122 Type 2 diabetes mellitus with diabetic chronic kidney disease: Secondary | ICD-10-CM | POA: Diagnosis not present

## 2018-09-10 DIAGNOSIS — I5043 Acute on chronic combined systolic (congestive) and diastolic (congestive) heart failure: Secondary | ICD-10-CM | POA: Diagnosis not present

## 2018-09-10 DIAGNOSIS — I428 Other cardiomyopathies: Secondary | ICD-10-CM | POA: Diagnosis not present

## 2018-09-10 DIAGNOSIS — D631 Anemia in chronic kidney disease: Secondary | ICD-10-CM | POA: Diagnosis not present

## 2018-09-10 DIAGNOSIS — I2510 Atherosclerotic heart disease of native coronary artery without angina pectoris: Secondary | ICD-10-CM | POA: Diagnosis not present

## 2018-09-10 DIAGNOSIS — N183 Chronic kidney disease, stage 3 (moderate): Secondary | ICD-10-CM | POA: Diagnosis not present

## 2018-09-10 DIAGNOSIS — C61 Malignant neoplasm of prostate: Secondary | ICD-10-CM | POA: Diagnosis not present

## 2018-09-10 DIAGNOSIS — N400 Enlarged prostate without lower urinary tract symptoms: Secondary | ICD-10-CM | POA: Diagnosis not present

## 2018-09-12 ENCOUNTER — Telehealth: Payer: Self-pay | Admitting: *Deleted

## 2018-09-12 DIAGNOSIS — G4733 Obstructive sleep apnea (adult) (pediatric): Secondary | ICD-10-CM

## 2018-09-12 NOTE — Addendum Note (Signed)
Addended by: Freada Bergeron on: 09/12/2018 04:17 PM   Modules accepted: Orders

## 2018-09-12 NOTE — Telephone Encounter (Signed)
Informed patient of sleep study results and patient understanding was verbalized. Patient understands his sleep study showed  they have sleep apnea and recommend CPAP titration. Please set up titration in the sleep lab.    Titration sent to precert

## 2018-09-12 NOTE — Telephone Encounter (Signed)
-----   Message from [name], MD sent at 09/08/2018 10:39 PM EDT ----- Please let patient know that they have sleep apnea and recommend CPAP titration. Please set up titration in the sleep lab.

## 2018-09-17 DIAGNOSIS — I130 Hypertensive heart and chronic kidney disease with heart failure and stage 1 through stage 4 chronic kidney disease, or unspecified chronic kidney disease: Secondary | ICD-10-CM | POA: Diagnosis not present

## 2018-09-17 DIAGNOSIS — E1122 Type 2 diabetes mellitus with diabetic chronic kidney disease: Secondary | ICD-10-CM | POA: Diagnosis not present

## 2018-09-17 DIAGNOSIS — I2729 Other secondary pulmonary hypertension: Secondary | ICD-10-CM | POA: Diagnosis not present

## 2018-09-17 DIAGNOSIS — N183 Chronic kidney disease, stage 3 (moderate): Secondary | ICD-10-CM | POA: Diagnosis not present

## 2018-09-17 DIAGNOSIS — I5043 Acute on chronic combined systolic (congestive) and diastolic (congestive) heart failure: Secondary | ICD-10-CM | POA: Diagnosis not present

## 2018-09-17 DIAGNOSIS — D631 Anemia in chronic kidney disease: Secondary | ICD-10-CM | POA: Diagnosis not present

## 2018-09-19 DIAGNOSIS — I130 Hypertensive heart and chronic kidney disease with heart failure and stage 1 through stage 4 chronic kidney disease, or unspecified chronic kidney disease: Secondary | ICD-10-CM | POA: Diagnosis not present

## 2018-09-19 DIAGNOSIS — N183 Chronic kidney disease, stage 3 (moderate): Secondary | ICD-10-CM | POA: Diagnosis not present

## 2018-09-19 DIAGNOSIS — E1122 Type 2 diabetes mellitus with diabetic chronic kidney disease: Secondary | ICD-10-CM | POA: Diagnosis not present

## 2018-09-19 DIAGNOSIS — D631 Anemia in chronic kidney disease: Secondary | ICD-10-CM | POA: Diagnosis not present

## 2018-09-19 DIAGNOSIS — I2729 Other secondary pulmonary hypertension: Secondary | ICD-10-CM | POA: Diagnosis not present

## 2018-09-19 DIAGNOSIS — I5043 Acute on chronic combined systolic (congestive) and diastolic (congestive) heart failure: Secondary | ICD-10-CM | POA: Diagnosis not present

## 2018-09-24 ENCOUNTER — Encounter (HOSPITAL_COMMUNITY): Payer: Self-pay

## 2018-09-24 ENCOUNTER — Ambulatory Visit (HOSPITAL_COMMUNITY)
Admission: RE | Admit: 2018-09-24 | Discharge: 2018-09-24 | Disposition: A | Discharge status: Home / Self Care | Payer: Medicare Other | Source: Ambulatory Visit | Attending: Adult Health | Admitting: Adult Health

## 2018-09-24 ENCOUNTER — Other Ambulatory Visit: Payer: Self-pay

## 2018-09-24 VITALS — BP 148/60 | Wt 205.0 lb

## 2018-09-24 DIAGNOSIS — N183 Chronic kidney disease, stage 3 unspecified: Secondary | ICD-10-CM

## 2018-09-24 DIAGNOSIS — I130 Hypertensive heart and chronic kidney disease with heart failure and stage 1 through stage 4 chronic kidney disease, or unspecified chronic kidney disease: Secondary | ICD-10-CM | POA: Diagnosis not present

## 2018-09-24 DIAGNOSIS — E1122 Type 2 diabetes mellitus with diabetic chronic kidney disease: Secondary | ICD-10-CM | POA: Diagnosis not present

## 2018-09-24 DIAGNOSIS — I5043 Acute on chronic combined systolic (congestive) and diastolic (congestive) heart failure: Secondary | ICD-10-CM | POA: Diagnosis not present

## 2018-09-24 DIAGNOSIS — I5022 Chronic systolic (congestive) heart failure: Secondary | ICD-10-CM | POA: Diagnosis not present

## 2018-09-24 DIAGNOSIS — D631 Anemia in chronic kidney disease: Secondary | ICD-10-CM | POA: Diagnosis not present

## 2018-09-24 DIAGNOSIS — I2729 Other secondary pulmonary hypertension: Secondary | ICD-10-CM | POA: Diagnosis not present

## 2018-09-24 DIAGNOSIS — G4733 Obstructive sleep apnea (adult) (pediatric): Secondary | ICD-10-CM

## 2018-09-24 NOTE — Progress Notes (Signed)
Heart Failure TeleHealth Note  Due to national recommendations of social distancing due to [place] 19, Audio/video telehealth visit is felt to be most appropriate for this patient at this time.  See MyChart message from today for patient consent regarding telehealth for [facility].  Date:  09/24/2018   ID:  Allen Dyer, DOB 1935/07/10, MRN [id number]  Location: Home  Provider location: [place] [facility] Failure Type of Visit: Established patient   PCP:  [name], MD  Cardiologist:  [name], MD Primary HF: Dr Aundra Dubin  Nephrology:   Chief Complaint: Heart Failure  History of Present Illness: Allen Dyer is a 83 y.o. male with a history ofDM, rheumatic fever, HTN, hyperlipidemia, CKD 3, prostate CA s/p XRT, systolic HF due to NICM (EF 30-35%) 05/2018,pulmonary venous HTN, and mild nonobstructive CAD.  Recently admitted 3/11-3/18/20 with volume overload. Diuresed with IV lasix. HF team consulted with concern for pulmonary HTN due to ongoing oxygen need. RHC completed and showed moderate pulmonary venousHTN with normal PVR 1.7 (no PAH). Transitioned from lasix to torsemide 40 mg BID at DC. Oxygen weaned off. HF medications optimized as able. Limited by CKD. DC weight 213 lbs.    He presents via Engineer, civil (consulting) for a telehealth visit today.  Overall feeling fine.SBP at home around 105-110.  Denies SOB/PND/Orthopnea. Rarely dizzy. Appetite ok. No fever or chills. Weight at home 205  pounds. Taking all medications. Followed by [facility].    he denies symptoms worrisome for COVID 19.   Past Medical History:  Diagnosis Date  . Chronic combined systolic and diastolic CHF (congestive heart failure) ([place])   . CKD (chronic kidney disease), stage III ([place])   . Hyperlipidemia   . Hypertension   . Lower extremity edema   . Mild CAD    a. mild-mod by cath 05/2018.  Marland Kitchen NICM (nonischemic cardiomyopathy) ([place])   . Peripheral neuropathy   . Prostate cancer ([place])    Status post XRT  . Rheumatic fever   . Trifascicular block   . Type 2 diabetes mellitus ([place])    Past Surgical History:  Procedure Laterality Date  . RIGHT HEART CATH N/A 07/10/2018   Procedure: RIGHT HEART CATH;  Surgeon: Larey Dresser, MD;  Location: [place] CV LAB;  Service: Cardiovascular;  Laterality: N/A;  . RIGHT/LEFT HEART CATH AND CORONARY ANGIOGRAPHY N/A 06/18/2018   Procedure: RIGHT/LEFT HEART CATH AND CORONARY ANGIOGRAPHY;  Surgeon: Troy Sine, MD;  Location: [place] CV LAB;  Service: Cardiovascular;  Laterality: N/A;     Current Outpatient Medications  Medication Sig Dispense Refill  . aspirin EC 81 MG tablet Take 81 mg by mouth every evening.     . carvedilol (COREG) 6.25 MG tablet Take 1.5 tablets (9.375 mg total) by mouth 2 (two) times daily. 270 tablet 1  . Cholecalciferol (VITAMIN D3 PO) Take 1 tablet by mouth daily.     . Cyanocobalamin (B-12 PO) Take 1 tablet by mouth daily.     Mar[place] Kitchen gabapentin (NEURONTIN) 800 MG tablet Take 800 mg by mouth 2 (two) times daily.     [place] Kitchen glipiZIDE (GLUCOTROL) 10 MG tablet Take 10 mg by mouth 2 (two) times daily.     . hydrALAZINE (APRESOLINE) 25 MG tablet Take 1 tablet (25 mg total) by mouth 3 (three) times daily. 90 tablet 3  . isosorbide mononitrate (IMDUR) 30 MG 24 hr tablet Take 1 tablet (30 mg total) by mouth daily. 30 tablet 2  . Multiple Vitamin (  MULTIVITAMIN WITH MINERALS) TABS tablet Take 1 tablet by mouth 2 (two) times daily.    Mar[place] Kitchen oxybutynin (DITROPAN-XL) 5 MG 24 hr tablet Take 5 mg by mouth daily.     Mar[place] Kitchen PRECISION XTRA TEST STRIPS test strip     . spironolactone (ALDACTONE) 25 MG tablet Take 1 tablet (25 mg total) by mouth at bedtime. 30 tablet 6  . tamsulosin (FLOMAX) 0.4 MG CAPS capsule Take 0.4 mg by mouth 2 (two) times daily.     Mar[place] Kitchen torsemide (DEMADEX) 20 MG tablet Take 2 tablets (40 mg total) by mouth 2 (two) times daily. 60 tablet 2   No current facility-administered medications for this encounter.      Allergies:   Tramadol; Finasteride; Lisinopril; Ciprofloxacin; Simvastatin; and Sulfa antibiotics   Social History:  The patient  reports that he has never smoked. He has never used smokeless tobacco. He reports that he does not drink alcohol or use drugs.   Family History:  The patient's family history includes Cancer in his mother and sister; Congestive Heart Failure in his brother and father.   ROS:  Please see the history of present illness.   All other systems are personally reviewed and negative.  Vitals:   09/24/18 1148  BP: (!) 148/60    Exam:  Tele Health Call; Exam is subjective  General:  Speaks in full sentences. No resp difficulty. Lungs: Normal respiratory effort with conversation.  Abdomen: Non-distended per patient report Extremities: Pt denies edema. Neuro: Alert & oriented x 3.   Recent Labs: 07/01/2018: NT-Pro BNP 5,013 07/03/2018: ALT 59; B Natriuretic Peptide 1,606.3; Magnesium 2.2 07/10/2018: BUN 41; Creatinine, Ser 2.15; Hemoglobin 9.5; Hemoglobin 9.5; Platelets 185; Potassium 3.6; Potassium 3.6; Sodium 139; Sodium 139  Personally reviewed   Wt Readings from Last 3 Encounters:  09/24/18 93 kg (205 lb)  09/03/18 93.9 kg (207 lb)  08/08/18 93.9 kg (207 lb)      ASSESSMENT AND PLAN:  1. Chronic systolic HF. New diagnosis 05/2018.Due to NICM. LHC 05/2018 with mild non-osbtructive CAD.Has had high BP for a long time, but well controlled. Could be secondary to OSA. Had palpitations in past, but very few PVCs on tele. Prior hx of ETOH abuse, but none in 40+ years. Did not receive chemo with prior prostate ca. QRS is wide on EKG 166 ms, but it is RBBB. -Echo2/5/20: EF 30-35%, diffuse HK, mod diastolic dysfunction, RV normal, LA moderately dilated, RA mildly dilated, mild AI, mild PI, mild dilation of ascending aorta - RHC 07/10/18: mildly elevated filling pressures, CO preserved, pulmonary venous HTN. No PAH. -NYHA II. Volume status sounds stable.  Continuetorsemide 40 mg BID.  - No ACE/ARB/ARNI/digwith CKD3 -Continue hydralazine 25 mg TID + imdur30mg  daily. No titration with soft SBP. SBP runs 105-110.  - Continue coreg to 9.375 mg BID. -Continuespiro 25 mg qHS.Will request labs from PCP office from last week - No Arb with elevated creatinine.  - Plan to repeat ECHO next month with Dr Aundra Dubin.   2. CKD 3 - Baseline creatinine looks to be 1.5-1.7. Reviewed in [place]. In 2017, baseline was more 2.0-2.3. - Creatinine 1.85, K 4.3 on 07/16/18.  Followed by Nephrology.   3. Mild nonobstructive CAD on [facility] 05/2018 - Not currently on statin due to intolerance(had a rash).LDL 62. -ContinueASA 81 mg daily.  -No chest pain.   4. Severe OSA with nocturnal hypoxia.  - Sleep study completed 09/03/18 . Planning on therapeutic CPAP titration once approved by insurance.  COVID screen The patient does not have any symptoms that suggest any further testing/ screening at this time.  Social distancing reinforced today.  Patient Risk: After full review of this patients clinical status, I feel that they are at moderate risk for cardiac decompensation at this time.  Relevant cardiac medications were reviewed at length with the patient today. The patient does not have concerns regarding their medications at this time.   The following changes were made today: None  Recommended follow-up: Follow up next   Today, I have spent 11 minutes with the patient with telehealth technology discussing the above issues .     Jeanmarie Hubert, NP  09/24/2018 11:52 AM  [place] [address] [facility] and [place] [address] [phone] (office) [phone] (fax)

## 2018-09-24 NOTE — Addendum Note (Signed)
Encounter addended by: Scarlette Calico, RN on: 09/24/2018 12:43 PM  Actions taken: Clinical Note Signed

## 2018-09-24 NOTE — Patient Instructions (Signed)
Continue current medications  Your physician recommends that you keep your follow-up appointment on: Tue 10/29/2018 at 11 AM for echocardiogram and 12:00 PM with Dr Aundra Dubin  If you have any questions or concerns before your next appointment please send [place] a message through [place] or call our office at [phone].

## 2018-09-24 NOTE — Progress Notes (Signed)
AVS mailed to pt.

## 2018-09-25 ENCOUNTER — Telehealth: Payer: Self-pay | Admitting: *Deleted

## 2018-09-25 NOTE — Telephone Encounter (Signed)
Staff message sent to [name] ok to schedule CPAP titration per COVID protocol. No PA is required.

## 2018-09-25 NOTE — Telephone Encounter (Signed)
-----   Message from Freada Bergeron, [place] sent at 09/12/2018  3:47 PM EDT ----- Regarding: precert  recommend CPAP titration  Wants test around week of 8-28 of July

## 2018-09-26 DIAGNOSIS — D631 Anemia in chronic kidney disease: Secondary | ICD-10-CM | POA: Diagnosis not present

## 2018-09-26 DIAGNOSIS — I130 Hypertensive heart and chronic kidney disease with heart failure and stage 1 through stage 4 chronic kidney disease, or unspecified chronic kidney disease: Secondary | ICD-10-CM | POA: Diagnosis not present

## 2018-09-26 DIAGNOSIS — N183 Chronic kidney disease, stage 3 (moderate): Secondary | ICD-10-CM | POA: Diagnosis not present

## 2018-09-26 DIAGNOSIS — I2729 Other secondary pulmonary hypertension: Secondary | ICD-10-CM | POA: Diagnosis not present

## 2018-09-26 DIAGNOSIS — I5043 Acute on chronic combined systolic (congestive) and diastolic (congestive) heart failure: Secondary | ICD-10-CM | POA: Diagnosis not present

## 2018-09-26 DIAGNOSIS — E1122 Type 2 diabetes mellitus with diabetic chronic kidney disease: Secondary | ICD-10-CM | POA: Diagnosis not present

## 2018-10-01 DIAGNOSIS — I130 Hypertensive heart and chronic kidney disease with heart failure and stage 1 through stage 4 chronic kidney disease, or unspecified chronic kidney disease: Secondary | ICD-10-CM | POA: Diagnosis not present

## 2018-10-01 DIAGNOSIS — N183 Chronic kidney disease, stage 3 (moderate): Secondary | ICD-10-CM | POA: Diagnosis not present

## 2018-10-01 DIAGNOSIS — I2729 Other secondary pulmonary hypertension: Secondary | ICD-10-CM | POA: Diagnosis not present

## 2018-10-01 DIAGNOSIS — D631 Anemia in chronic kidney disease: Secondary | ICD-10-CM | POA: Diagnosis not present

## 2018-10-01 DIAGNOSIS — E1122 Type 2 diabetes mellitus with diabetic chronic kidney disease: Secondary | ICD-10-CM | POA: Diagnosis not present

## 2018-10-01 DIAGNOSIS — I5043 Acute on chronic combined systolic (congestive) and diastolic (congestive) heart failure: Secondary | ICD-10-CM | POA: Diagnosis not present

## 2018-10-02 ENCOUNTER — Encounter: Payer: Self-pay | Admitting: Internal Medicine

## 2018-10-08 ENCOUNTER — Telehealth: Payer: Self-pay | Admitting: *Deleted

## 2018-10-08 DIAGNOSIS — G5602 Carpal tunnel syndrome, left upper limb: Secondary | ICD-10-CM | POA: Diagnosis not present

## 2018-10-08 DIAGNOSIS — M79641 Pain in right hand: Secondary | ICD-10-CM | POA: Diagnosis not present

## 2018-10-08 DIAGNOSIS — G5601 Carpal tunnel syndrome, right upper limb: Secondary | ICD-10-CM | POA: Diagnosis not present

## 2018-10-08 DIAGNOSIS — I2729 Other secondary pulmonary hypertension: Secondary | ICD-10-CM | POA: Diagnosis not present

## 2018-10-08 DIAGNOSIS — E1122 Type 2 diabetes mellitus with diabetic chronic kidney disease: Secondary | ICD-10-CM | POA: Diagnosis not present

## 2018-10-08 DIAGNOSIS — N183 Chronic kidney disease, stage 3 (moderate): Secondary | ICD-10-CM | POA: Diagnosis not present

## 2018-10-08 DIAGNOSIS — D631 Anemia in chronic kidney disease: Secondary | ICD-10-CM | POA: Diagnosis not present

## 2018-10-08 DIAGNOSIS — I5043 Acute on chronic combined systolic (congestive) and diastolic (congestive) heart failure: Secondary | ICD-10-CM | POA: Diagnosis not present

## 2018-10-08 DIAGNOSIS — I130 Hypertensive heart and chronic kidney disease with heart failure and stage 1 through stage 4 chronic kidney disease, or unspecified chronic kidney disease: Secondary | ICD-10-CM | POA: Diagnosis not present

## 2018-10-08 NOTE — Telephone Encounter (Signed)
Patient is scheduled for CPAP Titration on 10/31/18. Patient/Daughter per dpr understands his titration study will be done at [facility] sleep lab. Patient understands he will receive a call in a week or so detailing appointment, date, time, and location. Patient understands to call if he does not receive that call in a timely manner. Left detailed message on voicemail with date and time of titration and informed patient to call back to confirm or reschedule.

## 2018-10-08 NOTE — Telephone Encounter (Signed)
-----   Message from Lauralee Evener, [place] sent at 09/25/2018  8:47 AM EDT ----- Regarding: RE: precert Ok to schedule study. No PA is required. ----- Message ----- From: Freada Bergeron, CMA Sent: 09/12/2018   3:47 PM EDT To: [facility] Subject: precert                                         recommend CPAP titration  Wants test around week of 8-28 of July

## 2018-10-09 DIAGNOSIS — C61 Malignant neoplasm of prostate: Secondary | ICD-10-CM | POA: Diagnosis not present

## 2018-10-10 ENCOUNTER — Other Ambulatory Visit: Payer: Self-pay

## 2018-10-10 ENCOUNTER — Inpatient Hospital Stay: Payer: Medicare Other

## 2018-10-10 ENCOUNTER — Inpatient Hospital Stay: Payer: Medicare Other | Attending: Internal Medicine | Admitting: Internal Medicine

## 2018-10-10 VITALS — BP 110/59 | HR 62 | Temp 98.2°F | Resp 18 | Ht 67.0 in | Wt 213.0 lb

## 2018-10-10 DIAGNOSIS — C61 Malignant neoplasm of prostate: Secondary | ICD-10-CM | POA: Insufficient documentation

## 2018-10-10 DIAGNOSIS — D631 Anemia in chronic kidney disease: Secondary | ICD-10-CM | POA: Diagnosis not present

## 2018-10-10 DIAGNOSIS — M25559 Pain in unspecified hip: Secondary | ICD-10-CM | POA: Diagnosis not present

## 2018-10-10 DIAGNOSIS — I5033 Acute on chronic diastolic (congestive) heart failure: Secondary | ICD-10-CM | POA: Diagnosis not present

## 2018-10-10 DIAGNOSIS — N400 Enlarged prostate without lower urinary tract symptoms: Secondary | ICD-10-CM | POA: Diagnosis not present

## 2018-10-10 DIAGNOSIS — B20 Human immunodeficiency virus [HIV] disease: Secondary | ICD-10-CM | POA: Insufficient documentation

## 2018-10-10 DIAGNOSIS — E1142 Type 2 diabetes mellitus with diabetic polyneuropathy: Secondary | ICD-10-CM | POA: Diagnosis not present

## 2018-10-10 DIAGNOSIS — I130 Hypertensive heart and chronic kidney disease with heart failure and stage 1 through stage 4 chronic kidney disease, or unspecified chronic kidney disease: Secondary | ICD-10-CM | POA: Diagnosis not present

## 2018-10-10 DIAGNOSIS — E785 Hyperlipidemia, unspecified: Secondary | ICD-10-CM | POA: Diagnosis not present

## 2018-10-10 DIAGNOSIS — I5043 Acute on chronic combined systolic (congestive) and diastolic (congestive) heart failure: Secondary | ICD-10-CM | POA: Diagnosis not present

## 2018-10-10 DIAGNOSIS — Z114 Encounter for screening for human immunodeficiency virus [HIV]: Secondary | ICD-10-CM

## 2018-10-10 DIAGNOSIS — E1122 Type 2 diabetes mellitus with diabetic chronic kidney disease: Secondary | ICD-10-CM

## 2018-10-10 DIAGNOSIS — D539 Nutritional anemia, unspecified: Secondary | ICD-10-CM

## 2018-10-10 DIAGNOSIS — I2510 Atherosclerotic heart disease of native coronary artery without angina pectoris: Secondary | ICD-10-CM | POA: Diagnosis not present

## 2018-10-10 DIAGNOSIS — D472 Monoclonal gammopathy: Secondary | ICD-10-CM | POA: Diagnosis not present

## 2018-10-10 DIAGNOSIS — N183 Chronic kidney disease, stage 3 (moderate): Secondary | ICD-10-CM | POA: Insufficient documentation

## 2018-10-10 DIAGNOSIS — I2729 Other secondary pulmonary hypertension: Secondary | ICD-10-CM | POA: Diagnosis not present

## 2018-10-10 DIAGNOSIS — Z7984 Long term (current) use of oral hypoglycemic drugs: Secondary | ICD-10-CM | POA: Diagnosis not present

## 2018-10-10 DIAGNOSIS — I428 Other cardiomyopathies: Secondary | ICD-10-CM | POA: Diagnosis not present

## 2018-10-10 DIAGNOSIS — Z48812 Encounter for surgical aftercare following surgery on the circulatory system: Secondary | ICD-10-CM | POA: Diagnosis not present

## 2018-10-10 LAB — CMP (CANCER CENTER ONLY)
ALT: 16 U/L (ref 0–44)
AST: 20 U/L (ref 15–41)
Albumin: 3.4 g/dL — ABNORMAL LOW (ref 3.5–5.0)
Alkaline Phosphatase: 84 U/L (ref 38–126)
Anion gap: 8 (ref 5–15)
BUN: 31 mg/dL — ABNORMAL HIGH (ref 8–23)
CO2: 32 mmol/L (ref 22–32)
Calcium: 9.6 mg/dL (ref 8.9–10.3)
Chloride: 101 mmol/L (ref 98–111)
Creatinine: 2.06 mg/dL — ABNORMAL HIGH (ref 0.61–1.24)
GFR, Est AFR Am: 34 mL/min — ABNORMAL LOW (ref >60)
GFR, Estimated: 29 mL/min — ABNORMAL LOW (ref >60)
Glucose, Bld: 120 mg/dL — ABNORMAL HIGH (ref 70–99)
Potassium: 3.9 mmol/L (ref 3.5–5.1)
Sodium: 141 mmol/L (ref 135–145)
Total Bilirubin: 0.4 mg/dL (ref 0.3–1.2)
Total Protein: 7.3 g/dL (ref 6.5–8.1)

## 2018-10-10 LAB — CBC WITH DIFFERENTIAL (CANCER CENTER ONLY)
Abs Immature Granulocytes: 0.01 10*3/uL (ref 0.00–0.07)
Basophils Absolute: 0 10*3/uL (ref 0.0–0.1)
Basophils Relative: 0 %
Eosinophils Absolute: 0.3 10*3/uL (ref 0.0–0.5)
Eosinophils Relative: 7 %
HCT: 34.6 % — ABNORMAL LOW (ref 39.0–52.0)
Hemoglobin: 11 g/dL — ABNORMAL LOW (ref 13.0–17.0)
Immature Granulocytes: 0 %
Lymphocytes Relative: 26 %
Lymphs Abs: 1.3 10*3/uL (ref 0.7–4.0)
MCH: 31.3 pg (ref 26.0–34.0)
MCHC: 31.8 g/dL (ref 30.0–36.0)
MCV: 98.3 fL (ref 80.0–100.0)
Monocytes Absolute: 0.6 10*3/uL (ref 0.1–1.0)
Monocytes Relative: 12 %
Neutro Abs: 2.7 10*3/uL (ref 1.7–7.7)
Neutrophils Relative %: 55 %
Platelet Count: 176 10*3/uL (ref 150–400)
RBC: 3.52 MIL/uL — ABNORMAL LOW (ref 4.22–5.81)
RDW: 15.1 % (ref 11.5–15.5)
WBC Count: 4.9 10*3/uL (ref 4.0–10.5)
nRBC: 0 % (ref 0.0–0.2)

## 2018-10-10 LAB — VITAMIN B12: Vitamin B-12: 264 pg/mL (ref 180–914)

## 2018-10-10 LAB — FOLATE: Folate: 10 ng/mL (ref >5.9)

## 2018-10-10 LAB — IRON AND TIBC
Iron: 87 ug/dL (ref 42–163)
Saturation Ratios: 32 % (ref 20–55)
TIBC: 274 ug/dL (ref 202–409)
UIBC: 187 ug/dL (ref 117–376)

## 2018-10-10 LAB — FERRITIN: Ferritin: 66 ng/mL (ref 24–336)

## 2018-10-10 LAB — SEDIMENTATION RATE: Sed Rate: 44 mm/hr — ABNORMAL HIGH (ref 0–16)

## 2018-10-10 LAB — LACTATE DEHYDROGENASE: LDH: 216 U/L — ABNORMAL HIGH (ref 98–192)

## 2018-10-10 NOTE — Progress Notes (Signed)
Referring Physician:  Edrick Oh, MD  Diagnosis Monoclonal gammopathy - Plan: CBC with Differential ([place] Only), CMP ([place] only), Lactate dehydrogenase (LDH), Ferritin, Iron and TIBC, Vitamin B12, Folate, Serum, Methylmalonic acid, serum, SPEP with reflex to IFE, Kappa/lambda light chains, QIG  (Quant. immunoglobulins  - IgG, IgA, IgM), Hemoglobinopathy evaluation, Hepatitis B surface antibody, Hepatitis B surface antigen, Hepatitis B core antibody, total, Hepatitis C antibody, HIV antibody (with reflex), ANA, IFA (with reflex), Rheumatoid factor, Sedimentation rate, DG Bone Survey Met  Pain in joint involving pelvic region and thigh, unspecified laterality - Plan: CBC with Differential ([facility] Only), CMP ([place] only), Lactate dehydrogenase (LDH), Ferritin, Iron and TIBC, Vitamin B12, Folate, Serum, Methylmalonic acid, serum, SPEP with reflex to IFE, Kappa/lambda light chains, QIG  (Quant. immunoglobulins  - IgG, IgA, IgM), Hemoglobinopathy evaluation, Hepatitis B surface antibody, Hepatitis B surface antigen, Hepatitis B core antibody, total, Hepatitis C antibody, HIV antibody (with reflex), ANA, IFA (with reflex), Rheumatoid factor, Sedimentation rate, DG Bone Survey Met  Screening for HIV (human immunodeficiency virus) - Plan: CBC with Differential ([place] Only), CMP ([place] only), Lactate dehydrogenase (LDH), Ferritin, Iron and TIBC, Vitamin B12, Folate, Serum, Methylmalonic acid, serum, SPEP with reflex to IFE, Kappa/lambda light chains, QIG  (Quant. immunoglobulins  - IgG, IgA, IgM), Hemoglobinopathy evaluation, Hepatitis B surface antibody, Hepatitis B surface antigen, Hepatitis B core antibody, total, Hepatitis C antibody, HIV antibody (with reflex), ANA, IFA (with reflex), Rheumatoid factor, Sedimentation rate, DG Bone Survey Met  Nutritional anemia - Plan: CBC with Differential (Cancer Center Only), CMP ([place] only), Lactate dehydrogenase  (LDH), Ferritin, Iron and TIBC, Vitamin B12, Folate, Serum, Methylmalonic acid, serum, SPEP with reflex to IFE, Kappa/lambda light chains, QIG  (Quant. immunoglobulins  - IgG, IgA, IgM), Hemoglobinopathy evaluation, Hepatitis B surface antibody, Hepatitis B surface antigen, Hepatitis B core antibody, total, Hepatitis C antibody, HIV antibody (with reflex), ANA, IFA (with reflex), Rheumatoid factor, Sedimentation rate, DG Bone Survey Met  Staging Cancer Staging No matching staging information was found for the patient.  Assessment and Plan:  1.  Monoclonal gammopathy.   83 year old male referred for evaluation due to monoclonal gammopathy.  Pt reports history of chronic kidney disease and joint pain.  He has history of prostate cancer.  Pt had labs done 09/03/2018 with SPEP that showed M spike measuring 0.4 g/dl.  Immunofixation showed IGA monoclonal protein with kappa light chain specificity.  Chemistries showed Cr 1.75 K+ 3.6.  Random UPEP showed no monoclonal protein.  Quant IG showed elevated IGA of 710.  He had normal IGG of 1288 and IGM of 34.  Pt reports sister with history of bone cancer.  Pt is seen today for consultation due to monoclonal gammopathy.    Labs done today 10/10/2018 reviewed and showed WBC 4000 HB 11 plts 176,000.  Awaiting results of chemistries, SPEP with reflex to IFE, Kappa/lambda light chains, QIG  (Quant. immunoglobulins  - IgG, IgA, IgM).  Pt is set up For Skeletal survey.    Monoclonal gammopathy of undetermined significance (MGUS) which is a condition in which an abnormal protein - known as monoclonal protein or M protein - is in the blood.  MGUS usually causes no problems. But sometimes it can progress to more-serious conditions.    Due to elevated IGA level, I discussed with him need for further work-up. Increased levels of IgA are seen in several inflammatory disorders, including IgA nephropathy, vasculitis, AIDS, alcoholic cirrhosis, advanced hepatitis, IgA myeloma,  and  several autoimmune diseases such as rheumatoid arthritis and lupus.  Awaiting results of Hepatitis B surface antibody, Hepatitis B surface antigen, Hepatitis B core antibody, total, Hepatitis C antibody, HIV antibody (with reflex), ANA, IFA (with reflex), Rheumatoid factor, Sedimentation rate.  Pt will have phone visit follow-up in 2 weeks to go over results.   2.  Anemia.  Labs done today 10/10/2018 show HB WNL at 11.  Awaiting results of SPEP, HB electrophoresis, iron studies, B12, folate, MMA.  Pt will have phone visit follow-up in 2 weeks to go over results.   3.  Joint pain.  Awaiting results of SPEP, ANA, RF, Sed rate and skeletal survey.  Pt will have phone visit follow-up in 2 weeks to go over results.    4.  Prostate Cancer.  Pt reports he was treated with RT.  He is followed by Urology and should follow-up as directed.    5.  Chronic kidney disease.  Labs done today 10/10/2018 reviewed and showed K+ 3.9 Cr 2 Calcium normal at 9.6 normal LFTs.  Follow-up with PCP or nephrology as recommended.    40 minutes spent with more than 50% spent in review of records, counseling and coordination of care.    HPI:  83 year old male referred for evaluation due to monoclonal gammopathy.  Pt reports history of chronic kidney disease and joint pain.  He has history of prostate cancer.  Pt had labs done 09/03/2018 with SPEP that showed M spike measuring 0.4 g/dl.  Immunofixation showed IGA monoclonal protein with kappa light chain specificity.  Chemistries showed Cr 1.75 K+ 3.6.  Random UPEP showed no monoclonal protein.  Quant IG showed elevated IGA of 710.  He had normal IGG of 1288 and IGM of 34.  Pt reports sister with history of bone cancer.  Pt is seen today for consultation due to monoclonal gammopathy.    Problem List Patient Active Problem List   Diagnosis Date Noted  . Acute on chronic combined systolic and diastolic CHF (congestive heart failure) ([place]) [I50.43]   . Acute on chronic congestive  heart failure ([place]) [I50.9] 07/04/2018  . CHF exacerbation ([place]) [I50.9] 07/03/2018  . AKI (acute kidney injury) ([place]) [N17.9] 07/03/2018  . Acute CHF (congestive heart failure) ([place]) [I50.9] 06/18/2018  . NICM (nonischemic cardiomyopathy) ([place]) [I42.8]   . Pulmonary hypertension ([place]) [I27.20]   . Essential hypertension [I10] 06/14/2018  . Type 2 diabetes mellitus ([place]) [E11.9]   . Prostate cancer ([place]) [C61]   . Rheumatic fever [I00]   . Peripheral neuropathy [G62.9]   . Lower extremity edema [R60.0]   . CKD (chronic kidney disease), stage III ([place]) [N18.3]   . Hyperlipidemia [E78.5]   . Chronic systolic CHF (congestive heart failure) ([facility]) [I50.22]     Past Medical History Past Medical History:  Diagnosis Date  . Chronic combined systolic and diastolic CHF (congestive heart failure) ([place])   . CKD (chronic kidney disease), stage III ([place])   . Hyperlipidemia   . Hypertension   . Lower extremity edema   . Mild CAD    a. mild-mod by cath 05/2018.  Marland Kitchen NICM (nonischemic cardiomyopathy) ([place])   . Peripheral neuropathy   . Prostate cancer ([place])    Status post XRT  . Rheumatic fever   . Trifascicular block   . Type 2 diabetes mellitus [facility])     Past Surgical History Past Surgical History:  Procedure Laterality Date  . RIGHT HEART CATH N/A 07/10/2018   Procedure: RIGHT  HEART CATH;  Surgeon: Larey Dresser, MD;  Location: [facility] [facility];  Service: Cardiovascular;  Laterality: N/A;  . RIGHT/LEFT HEART CATH AND CORONARY ANGIOGRAPHY N/A 06/18/2018   Procedure: RIGHT/LEFT HEART CATH AND CORONARY ANGIOGRAPHY;  Surgeon: [place] Sine, MD;  Location: [place] [facility];  Service: Cardiovascular;  Laterality: N/A;    Family History Family History  Problem Relation Age of Onset  . Congestive Heart Failure Father        died from heart failure at the age of 32  . Cancer Mother   . Cancer Sister   . Congestive Heart Failure Brother        died from heart failure at the age of 30      Social History  reports that he has never smoked. He has never used smokeless tobacco. He reports that he does not drink alcohol or use drugs.  Medications  Current Outpatient Medications:  .  aspirin EC 81 MG tablet, Take 81 mg by mouth every evening. , Disp: , Rfl:  .  carvedilol (COREG) 6.25 MG tablet, Take 1.5 tablets (9.375 mg total) by mouth 2 (two) times daily., Disp: 270 tablet, Rfl: 1 .  Cholecalciferol (VITAMIN D3 PO), Take 1 tablet by mouth daily. , Disp: , Rfl:  .  Cyanocobalamin (B-12 PO), Take 1 tablet by mouth daily. , Disp: , Rfl:  .  gabapentin (NEURONTIN) 800 MG tablet, Take 800 mg by mouth 2 (two) times daily. , Disp: , Rfl:  .  glipiZIDE (GLUCOTROL) 10 MG tablet, Take 10 mg by mouth 2 (two) times daily. , Disp: , Rfl:  .  hydrALAZINE (APRESOLINE) 25 MG tablet, Take 1 tablet (25 mg total) by mouth 3 (three) times daily., Disp: 90 tablet, Rfl: 3 .  isosorbide mononitrate (IMDUR) 30 MG 24 hr tablet, Take 1 tablet (30 mg total) by mouth daily., Disp: 30 tablet, Rfl: 2 .  Multiple Vitamin (MULTIVITAMIN WITH MINERALS) TABS tablet, Take 1 tablet by mouth 2 (two) times daily., Disp: , Rfl:  .  oxybutynin (DITROPAN-XL) 5 MG 24 hr tablet, Take 5 mg by mouth daily. , Disp: , Rfl:  .  PRECISION XTRA TEST STRIPS test strip, , Disp: , Rfl:  .  spironolactone (ALDACTONE) 25 MG tablet, Take 1 tablet (25 mg total) by mouth at bedtime., Disp: 30 tablet, Rfl: 6 .  tamsulosin (FLOMAX) 0.4 MG CAPS capsule, Take 0.4 mg by mouth 2 (two) times daily. , Disp: , Rfl:  .  torsemide (DEMADEX) 20 MG tablet, Take 2 tablets (40 mg total) by mouth 2 (two) times daily., Disp: 60 tablet, Rfl: 2  Allergies Tramadol, Finasteride, Lisinopril, Ciprofloxacin, Simvastatin, and Sulfa antibiotics  Review of Systems Review of Systems - Oncology ROS negative   Physical Exam  Vitals Wt Readings from Last 3 Encounters:  10/10/18 213 lb (96.6 kg)  09/24/18 205 lb (93 kg)  09/03/18 207 lb (93.9 kg)    Temp Readings from Last 3 Encounters:  10/10/18 98.2 F (36.8 C) (Oral)  07/18/18 (!) 97 F (36.1 C)  07/10/18 98 F (36.7 C) (Oral)   BP Readings from Last 3 Encounters:  10/10/18 (!) 110/59  09/24/18 (!) 148/60  07/18/18 119/69   Pulse Readings from Last 3 Encounters:  10/10/18 62  07/18/18 (!) 59  07/10/18 68   Constitutional: Well-developed, well-nourished, and in no distress.   HENT: Head: Normocephalic and atraumatic.  Mouth/Throat: No oropharyngeal exudate. Mucosa moist. Eyes: Pupils are equal, round, and reactive to  light. Conjunctivae are normal. No scleral icterus.  Neck: Normal range of motion. Neck supple. No JVD present.  Cardiovascular: Normal rate, regular rhythm and normal heart sounds.  Exam reveals no gallop and no friction rub.   No murmur heard. Pulmonary/Chest: Effort normal and breath sounds normal. No respiratory distress. No wheezes.No rales.  Abdominal: Soft. Bowel sounds are normal. No distension. There is no tenderness. There is no guarding.  Musculoskeletal: No edema.  Arthritis changes noted in hands.   Lymphadenopathy: No cervical,axillary or supraclavicular adenopathy.  Neurological: Alert and oriented to person, place, and time. No cranial nerve deficit.  Skin: Skin is warm and dry. No rash noted. No erythema. No pallor.  Psychiatric: Affect and judgment normal.   Labs Appointment on 10/10/2018  Component Date Value Ref Range Status  . WBC Count 10/10/2018 4.9  4.0 - 10.5 K/uL Final  . RBC 10/10/2018 3.52* 4.22 - 5.81 MIL/uL Final  . Hemoglobin 10/10/2018 11.0* 13.0 - 17.0 g/dL Final  . HCT 10/10/2018 34.6* 39.0 - 52.0 % Final  . MCV 10/10/2018 98.3  80.0 - 100.0 fL Final  . MCH 10/10/2018 31.3  26.0 - 34.0 pg Final  . MCHC 10/10/2018 31.8  30.0 - 36.0 g/dL Final  . RDW 10/10/2018 15.1  11.5 - 15.5 % Final  . Platelet Count 10/10/2018 176  150 - 400 K/uL Final  . nRBC 10/10/2018 0.0  0.0 - 0.2 % Final  . Neutrophils Relative %  10/10/2018 55  % Final  . Neutro Abs 10/10/2018 2.7  1.7 - 7.7 K/uL Final  . Lymphocytes Relative 10/10/2018 26  % Final  . Lymphs Abs 10/10/2018 1.3  0.7 - 4.0 K/uL Final  . Monocytes Relative 10/10/2018 12  % Final  . Monocytes Absolute 10/10/2018 0.6  0.1 - 1.0 K/uL Final  . Eosinophils Relative 10/10/2018 7  % Final  . Eosinophils Absolute 10/10/2018 0.3  0.0 - 0.5 K/uL Final  . Basophils Relative 10/10/2018 0  % Final  . Basophils Absolute 10/10/2018 0.0  0.0 - 0.1 K/uL Final  . Immature Granulocytes 10/10/2018 0  % Final  . Abs Immature Granulocytes 10/10/2018 0.01  0.00 - 0.07 K/uL Final   Performed at [facility], [place] [address] [place], [place] [address]     Pathology Orders Placed This Encounter  Procedures  . DG Bone Survey Met    Standing Status:   Future    Standing Expiration Date:   12/10/2019    Order Specific Question:   Reason for Exam (SYMPTOM  OR DIAGNOSIS REQUIRED)    Answer:   monoclonal gammopathy    Order Specific Question:   Preferred imaging location?    Answer:   [facility]    Order Specific Question:   Radiology Contrast Protocol - do NOT remove file path    Answer:   [email or web address]  . CBC with Differential ([place] Only)    Standing Status:   Future    Number of Occurrences:   1    Standing Expiration Date:   10/10/2019  . CMP ([place] only)    Standing Status:   Future    Number of Occurrences:   1    Standing Expiration Date:   10/10/2019  . Lactate dehydrogenase (LDH)    Standing Status:   Future    Number of Occurrences:   1    Standing Expiration Date:   10/10/2019  . Ferritin    Standing Status:  Future    Number of Occurrences:   1    Standing Expiration Date:   10/10/2019  . Iron and TIBC    Standing Status:   Future    Number of Occurrences:   1    Standing Expiration Date:   10/10/2019  . Vitamin B12    Standing Status:   Future    Number of  Occurrences:   1    Standing Expiration Date:   10/10/2019  . Folate, Serum    Standing Status:   Future    Number of Occurrences:   1    Standing Expiration Date:   10/10/2019  . Methylmalonic acid, serum    Standing Status:   Future    Number of Occurrences:   1    Standing Expiration Date:   10/10/2019  . SPEP with reflex to IFE    Standing Status:   Future    Number of Occurrences:   1    Standing Expiration Date:   10/10/2019  . Kappa/lambda light chains    Standing Status:   Future    Number of Occurrences:   1    Standing Expiration Date:   10/10/2019  . QIG  (Quant. immunoglobulins  - IgG, IgA, IgM)    Standing Status:   Future    Number of Occurrences:   1    Standing Expiration Date:   10/10/2019  . Hemoglobinopathy evaluation    Standing Status:   Future    Number of Occurrences:   1    Standing Expiration Date:   10/10/2019  . Hepatitis B surface antibody    Standing Status:   Future    Number of Occurrences:   1    Standing Expiration Date:   10/10/2019  . Hepatitis B surface antigen    Standing Status:   Future    Number of Occurrences:   1    Standing Expiration Date:   10/10/2019  . Hepatitis B core antibody, total    Standing Status:   Future    Number of Occurrences:   1    Standing Expiration Date:   10/10/2019  . Hepatitis C antibody    Standing Status:   Future    Number of Occurrences:   1    Standing Expiration Date:   10/10/2019  . HIV antibody (with reflex)    Standing Status:   Future    Number of Occurrences:   1    Standing Expiration Date:   10/10/2019  . ANA, IFA (with reflex)    Standing Status:   Future    Number of Occurrences:   1    Standing Expiration Date:   10/10/2019  . Rheumatoid factor    Standing Status:   Future    Number of Occurrences:   1    Standing Expiration Date:   10/10/2019  . Sedimentation rate    Standing Status:   Future    Number of Occurrences:   1    Standing Expiration Date:   10/10/2019       Zoila Shutter MD

## 2018-10-11 LAB — IGG, IGA, IGM
IgA: 670 mg/dL — ABNORMAL HIGH (ref 61–437)
IgG (Immunoglobin G), Serum: 1365 mg/dL (ref 603–1613)
IgM (Immunoglobulin M), Srm: 39 mg/dL (ref 15–143)

## 2018-10-11 LAB — RHEUMATOID FACTOR: Rheumatoid fact SerPl-aCnc: <10 IU/mL (ref 0.0–13.9)

## 2018-10-11 LAB — HEPATITIS C ANTIBODY: HCV Ab: 0.1 s/co ratio (ref 0.0–0.9)

## 2018-10-11 LAB — HEPATITIS B SURFACE ANTIGEN: Hepatitis B Surface Ag: NEGATIVE

## 2018-10-11 LAB — KAPPA/LAMBDA LIGHT CHAINS
Kappa free light chain: 119.2 mg/L — ABNORMAL HIGH (ref 3.3–19.4)
Kappa, lambda light chain ratio: 4.67 — ABNORMAL HIGH (ref 0.26–1.65)
Lambda free light chains: 25.5 mg/L (ref 5.7–26.3)

## 2018-10-11 LAB — HEPATITIS B SURFACE ANTIBODY,QUALITATIVE: Hep B S Ab: NONREACTIVE

## 2018-10-11 LAB — ANTINUCLEAR ANTIBODIES, IFA: ANA Ab, IFA: NEGATIVE

## 2018-10-11 LAB — HIV ANTIBODY (ROUTINE TESTING W REFLEX): HIV Screen 4th Generation wRfx: NONREACTIVE

## 2018-10-11 LAB — HEPATITIS B CORE ANTIBODY, TOTAL: Hep B Core Total Ab: POSITIVE — AB

## 2018-10-12 LAB — HEMOGLOBINOPATHY EVALUATION
Hgb A2 Quant: 1.7 % — ABNORMAL LOW (ref 1.8–3.2)
Hgb A: 98.3 % (ref 96.4–98.8)
Hgb C: 0 %
Hgb F Quant: 0 % (ref 0.0–2.0)
Hgb S Quant: 0 %
Hgb Variant: 0 %

## 2018-10-12 LAB — METHYLMALONIC ACID, SERUM: Methylmalonic Acid, Quantitative: 413 nmol/L — ABNORMAL HIGH (ref 0–378)

## 2018-10-14 ENCOUNTER — Telehealth: Payer: Self-pay | Admitting: Internal Medicine

## 2018-10-14 ENCOUNTER — Encounter: Payer: Self-pay | Admitting: *Deleted

## 2018-10-14 NOTE — Telephone Encounter (Signed)
Scheduled appt per los. Called and spoke with patient. Confirmed date and time of phone visit

## 2018-10-15 DIAGNOSIS — R232 Flushing: Secondary | ICD-10-CM | POA: Diagnosis not present

## 2018-10-15 DIAGNOSIS — Z8546 Personal history of malignant neoplasm of prostate: Secondary | ICD-10-CM | POA: Diagnosis not present

## 2018-10-15 DIAGNOSIS — N400 Enlarged prostate without lower urinary tract symptoms: Secondary | ICD-10-CM | POA: Diagnosis not present

## 2018-10-15 LAB — PROTEIN ELECTROPHORESIS, SERUM, WITH REFLEX
A/G Ratio: 0.9 (ref 0.7–1.7)
Albumin ELP: 3.2 g/dL (ref 2.9–4.4)
Alpha-1-Globulin: 0.2 g/dL (ref 0.0–0.4)
Alpha-2-Globulin: 0.6 g/dL (ref 0.4–1.0)
Beta Globulin: 1.4 g/dL — ABNORMAL HIGH (ref 0.7–1.3)
Gamma Globulin: 1.3 g/dL (ref 0.4–1.8)
Globulin, Total: 3.6 g/dL (ref 2.2–3.9)
Total Protein ELP: 6.8 g/dL (ref 6.0–8.5)

## 2018-10-16 DIAGNOSIS — D472 Monoclonal gammopathy: Secondary | ICD-10-CM | POA: Diagnosis not present

## 2018-10-16 DIAGNOSIS — E7800 Pure hypercholesterolemia, unspecified: Secondary | ICD-10-CM | POA: Diagnosis not present

## 2018-10-16 DIAGNOSIS — Z7984 Long term (current) use of oral hypoglycemic drugs: Secondary | ICD-10-CM | POA: Diagnosis not present

## 2018-10-16 DIAGNOSIS — N183 Chronic kidney disease, stage 3 (moderate): Secondary | ICD-10-CM | POA: Diagnosis not present

## 2018-10-16 DIAGNOSIS — E119 Type 2 diabetes mellitus without complications: Secondary | ICD-10-CM | POA: Diagnosis not present

## 2018-10-17 ENCOUNTER — Ambulatory Visit (HOSPITAL_COMMUNITY)
Admission: RE | Admit: 2018-10-17 | Discharge: 2018-10-17 | Disposition: A | Discharge status: Home / Self Care | Payer: Medicare Other | Source: Ambulatory Visit | Attending: Internal Medicine | Admitting: Internal Medicine

## 2018-10-17 ENCOUNTER — Other Ambulatory Visit: Payer: Self-pay

## 2018-10-17 DIAGNOSIS — N183 Chronic kidney disease, stage 3 (moderate): Secondary | ICD-10-CM | POA: Diagnosis not present

## 2018-10-17 DIAGNOSIS — M25559 Pain in unspecified hip: Secondary | ICD-10-CM | POA: Diagnosis not present

## 2018-10-17 DIAGNOSIS — D539 Nutritional anemia, unspecified: Secondary | ICD-10-CM | POA: Diagnosis not present

## 2018-10-17 DIAGNOSIS — I130 Hypertensive heart and chronic kidney disease with heart failure and stage 1 through stage 4 chronic kidney disease, or unspecified chronic kidney disease: Secondary | ICD-10-CM | POA: Diagnosis not present

## 2018-10-17 DIAGNOSIS — Z114 Encounter for screening for human immunodeficiency virus [HIV]: Secondary | ICD-10-CM | POA: Diagnosis not present

## 2018-10-17 DIAGNOSIS — D472 Monoclonal gammopathy: Secondary | ICD-10-CM | POA: Diagnosis not present

## 2018-10-17 DIAGNOSIS — E1122 Type 2 diabetes mellitus with diabetic chronic kidney disease: Secondary | ICD-10-CM | POA: Diagnosis not present

## 2018-10-17 DIAGNOSIS — D631 Anemia in chronic kidney disease: Secondary | ICD-10-CM | POA: Diagnosis not present

## 2018-10-17 DIAGNOSIS — C61 Malignant neoplasm of prostate: Secondary | ICD-10-CM | POA: Diagnosis not present

## 2018-10-17 DIAGNOSIS — I2729 Other secondary pulmonary hypertension: Secondary | ICD-10-CM | POA: Diagnosis not present

## 2018-10-17 DIAGNOSIS — I5043 Acute on chronic combined systolic (congestive) and diastolic (congestive) heart failure: Secondary | ICD-10-CM | POA: Diagnosis not present

## 2018-10-18 ENCOUNTER — Encounter: Payer: Self-pay | Admitting: Podiatry

## 2018-10-18 ENCOUNTER — Ambulatory Visit (INDEPENDENT_AMBULATORY_CARE_PROVIDER_SITE_OTHER): Payer: Medicare Other | Admitting: Podiatry

## 2018-10-18 ENCOUNTER — Other Ambulatory Visit: Payer: Self-pay

## 2018-10-18 DIAGNOSIS — M79674 Pain in right toe(s): Secondary | ICD-10-CM | POA: Diagnosis not present

## 2018-10-18 DIAGNOSIS — B351 Tinea unguium: Secondary | ICD-10-CM | POA: Diagnosis not present

## 2018-10-18 DIAGNOSIS — M79675 Pain in left toe(s): Secondary | ICD-10-CM

## 2018-10-18 DIAGNOSIS — E1159 Type 2 diabetes mellitus with other circulatory complications: Secondary | ICD-10-CM | POA: Insufficient documentation

## 2018-10-18 NOTE — Progress Notes (Signed)
Complaint:  Visit Type: Patient returns to my office for continued preventative foot care services. Complaint: Patient states" my nails have grown long and thick and become painful to walk and wear shoes" Patient has been diagnosed with DM with vascular  complications. The patient presents for preventative foot care services. No changes to ROS  Podiatric Exam: Vascular: dorsalis pedis and posterior tibial pulses are not  palpable bilateral. Capillary return is immediate. Temperature gradient is WNL. Skin turgor WNL  Sensorium: Normal Semmes Weinstein monofilament test. Normal tactile sensation bilaterally. Nail Exam: Pt has thick disfigured discolored nails with subungual debris noted bilateral entire nail hallux through fifth toenails Ulcer Exam: There is no evidence of ulcer or pre-ulcerative changes or infection. Orthopedic Exam: Muscle tone and strength are WNL. No limitations in general ROM. No crepitus or effusions noted. Foot type and digits show no abnormalities. Skin: No Porokeratosis. No infection or ulcers  Diagnosis:  Onychomycosis, , Pain in right toe, pain in left toes  Treatment & Plan Procedures and Treatment: Consent by patient was obtained for treatment procedures.   Debridement of mycotic and hypertrophic toenails, 1 through 5 bilateral and clearing of subungual debris. No ulceration, no infection noted. ABN signed for 2019. Return Visit-Office Procedure: Patient instructed to return to the office for a follow up visit 3 months for continued evaluation and treatment.    Gardiner Barefoot DPM

## 2018-10-22 ENCOUNTER — Other Ambulatory Visit (HOSPITAL_COMMUNITY)
Admission: RE | Admit: 2018-10-22 | Discharge: 2018-10-22 | Disposition: A | Discharge status: Home / Self Care | Payer: Medicare Other | Source: Ambulatory Visit | Attending: Cardiology | Admitting: Cardiology

## 2018-10-22 DIAGNOSIS — Z01812 Encounter for preprocedural laboratory examination: Secondary | ICD-10-CM | POA: Diagnosis not present

## 2018-10-22 DIAGNOSIS — Z1159 Encounter for screening for other viral diseases: Secondary | ICD-10-CM | POA: Insufficient documentation

## 2018-10-22 LAB — SARS CORONAVIRUS 2 (TAT 6-24 HRS): SARS Coronavirus 2: NEGATIVE

## 2018-10-23 DIAGNOSIS — I5043 Acute on chronic combined systolic (congestive) and diastolic (congestive) heart failure: Secondary | ICD-10-CM | POA: Diagnosis not present

## 2018-10-23 DIAGNOSIS — E119 Type 2 diabetes mellitus without complications: Secondary | ICD-10-CM | POA: Diagnosis not present

## 2018-10-23 DIAGNOSIS — N183 Chronic kidney disease, stage 3 (moderate): Secondary | ICD-10-CM | POA: Diagnosis not present

## 2018-10-23 DIAGNOSIS — D631 Anemia in chronic kidney disease: Secondary | ICD-10-CM | POA: Diagnosis not present

## 2018-10-23 DIAGNOSIS — I130 Hypertensive heart and chronic kidney disease with heart failure and stage 1 through stage 4 chronic kidney disease, or unspecified chronic kidney disease: Secondary | ICD-10-CM | POA: Diagnosis not present

## 2018-10-23 DIAGNOSIS — E1122 Type 2 diabetes mellitus with diabetic chronic kidney disease: Secondary | ICD-10-CM | POA: Diagnosis not present

## 2018-10-23 DIAGNOSIS — I2729 Other secondary pulmonary hypertension: Secondary | ICD-10-CM | POA: Diagnosis not present

## 2018-10-24 ENCOUNTER — Inpatient Hospital Stay: Payer: Medicare Other | Attending: Internal Medicine | Admitting: Internal Medicine

## 2018-10-24 ENCOUNTER — Other Ambulatory Visit: Payer: Self-pay

## 2018-10-24 ENCOUNTER — Ambulatory Visit (HOSPITAL_BASED_OUTPATIENT_CLINIC_OR_DEPARTMENT_OTHER): Payer: Medicare Other | Attending: Cardiology | Admitting: Cardiology

## 2018-10-24 VITALS — Ht 67.0 in | Wt 213.0 lb

## 2018-10-24 DIAGNOSIS — I471 Supraventricular tachycardia: Secondary | ICD-10-CM | POA: Insufficient documentation

## 2018-10-24 DIAGNOSIS — G4733 Obstructive sleep apnea (adult) (pediatric): Secondary | ICD-10-CM | POA: Diagnosis not present

## 2018-10-24 DIAGNOSIS — D649 Anemia, unspecified: Secondary | ICD-10-CM | POA: Insufficient documentation

## 2018-10-24 DIAGNOSIS — R0902 Hypoxemia: Secondary | ICD-10-CM | POA: Diagnosis not present

## 2018-10-24 DIAGNOSIS — D472 Monoclonal gammopathy: Secondary | ICD-10-CM

## 2018-10-24 DIAGNOSIS — Z79899 Other long term (current) drug therapy: Secondary | ICD-10-CM | POA: Insufficient documentation

## 2018-10-24 DIAGNOSIS — N183 Chronic kidney disease, stage 3 (moderate): Secondary | ICD-10-CM | POA: Insufficient documentation

## 2018-10-24 DIAGNOSIS — I130 Hypertensive heart and chronic kidney disease with heart failure and stage 1 through stage 4 chronic kidney disease, or unspecified chronic kidney disease: Secondary | ICD-10-CM | POA: Insufficient documentation

## 2018-10-24 DIAGNOSIS — Z7982 Long term (current) use of aspirin: Secondary | ICD-10-CM | POA: Insufficient documentation

## 2018-10-24 DIAGNOSIS — C9000 Multiple myeloma not having achieved remission: Secondary | ICD-10-CM | POA: Insufficient documentation

## 2018-10-24 DIAGNOSIS — Z923 Personal history of irradiation: Secondary | ICD-10-CM | POA: Insufficient documentation

## 2018-10-24 DIAGNOSIS — E1122 Type 2 diabetes mellitus with diabetic chronic kidney disease: Secondary | ICD-10-CM | POA: Insufficient documentation

## 2018-10-24 DIAGNOSIS — Z8546 Personal history of malignant neoplasm of prostate: Secondary | ICD-10-CM | POA: Insufficient documentation

## 2018-10-24 DIAGNOSIS — Z87891 Personal history of nicotine dependence: Secondary | ICD-10-CM | POA: Insufficient documentation

## 2018-10-24 DIAGNOSIS — I5043 Acute on chronic combined systolic (congestive) and diastolic (congestive) heart failure: Secondary | ICD-10-CM | POA: Insufficient documentation

## 2018-10-24 NOTE — Progress Notes (Signed)
Virtual Visit via Telephone Note  I connected with Allen Dyer on 10/24/18 at  9:50 AM EDT by telephone and verified that I am speaking with the correct person using two identifiers.   I discussed the limitations, risks, security and privacy concerns of performing an evaluation and management service by telephone and the availability of in person appointments. I also discussed with the patient that there may be a patient responsible charge related to this service. The patient expressed understanding and agreed to proceed.  Interval History.  Historical data obtained from note dated 10/10/2018.  83 year old male referred for evaluation due to monoclonal gammopathy.  Pt reports history of chronic kidney disease and joint pain.  He has history of prostate cancer.  Pt had labs done 09/03/2018 with SPEP that showed M spike measuring 0.4 g/dl.  Immunofixation showed IGA monoclonal protein with kappa light chain specificity.  Chemistries showed Cr 1.75 K+ 3.6.  Random UPEP showed no monoclonal protein.  Quant IG showed elevated IGA of 710.  He had normal IGG of 1288 and IGM of 34.  Pt reports sister with history of bone cancer.    Observations/Objective:  Review of labs and Xrays.     Assessment and Plan:  1.  Monoclonal gammopathy.   83 year old male referred for evaluation due to monoclonal gammopathy.  Pt reports history of chronic kidney disease and joint pain.  He has history of prostate cancer.  Pt had labs done 09/03/2018 with SPEP that showed M spike measuring 0.4 g/dl.  Immunofixation showed IGA monoclonal protein with kappa light chain specificity.  Chemistries showed Cr 1.75 K+ 3.6.  Random UPEP showed no monoclonal protein.  Quant IG showed elevated IGA of 710.  He had normal IGG of 1288 and IGM of 34.  Pt reports sister with history of bone cancer.    Labs done  10/10/2018 reviewed and showed WBC 4000 HB 11 plts 176,000.  Chemistries showed K+ 3.9 Cr 2.06 normal LFTs, normal calcium of 9.6.   Ferritin WNL at 66.   SPEP negative for monoclonal protein.  He has elevated IGA level of 670 with normal IGG and IGM.  Serum FLC shows Kappa light chain of 119.2, lambda light chain of 25.2 with FLC ratio of 4.67.    Skeletal survey done 10/17/2018 shows cardiomegaly, but no metastatic or myeloma bone lesions.    Pt will be set up for bone marrow biopsy due to findings of RI and elevated FLC ratio with previous SPEP showing monoclonal protein and elevated IGA level.    Monoclonal gammopathy of undetermined significance (MGUS) which is a condition in which an abnormal protein - known as monoclonal protein or M protein - is in the blood.  MGUS usually causes no problems. But sometimes it can progress to more-serious conditions.    Increased levels of IgA are seen in several inflammatory disorders, including IgA nephropathy, vasculitis, AIDS, alcoholic cirrhosis, advanced hepatitis, IgA myeloma, and several autoimmune diseases such as rheumatoid arthritis and lupus.  He has elevated sed rate of 44 but normal RF and ANA. HIV testing is negative.  He has a + Hepatitis B core antibody that indicates prior exposure.    Pt will have phone visit follow-up in 2 weeks to go over bone marrow biopsy results.  Daughter on phone with pt and all questions answered.   2.  Anemia.  Labs done  10/10/2018 show HB WNL at 11.  He has normal SPEP, HB electrophoresis, iron studies, B12,  folate, MMA.  Pt has RI and elevated FLC ratio.  Awaiting results of bone marrow biopsy.    3.  Joint pain.  He has normal SPEP, ANA, RF and skeletal survey.  Pt has slightly elevated sed rate of 44.  Follow-up with PCP if ongoing symptoms.   4.  Prostate Cancer.  Pt reports he was treated with RT.  He is followed by Urology and should follow-up as directed.  Recent skeletal survey done 10/17/2018 shows no bone lesions.    5.  Chronic kidney disease.  Labs done  10/10/2018 reviewed and showed K+ 3.9 Cr 2 Calcium normal at 9.6 normal LFTs.   Follow-up with PCP or nephrology as recommended.    Follow Up Instructions: Bone marrow aspirate and biopsy in 1 week.  Phone visit follow-up in 2 weeks to go over results.      I discussed the assessment and treatment plan with the patient. The patient was provided an opportunity to ask questions and all were answered. The patient agreed with the plan and demonstrated an understanding of the instructions.   The patient was advised to call back or seek an in-person evaluation if the symptoms worsen or if the condition fails to improve as anticipated.  I provided 15 minutes of non-face-to-face time during this encounter.   Zoila Shutter, MD

## 2018-10-28 ENCOUNTER — Other Ambulatory Visit: Payer: Self-pay

## 2018-10-28 ENCOUNTER — Other Ambulatory Visit (HOSPITAL_BASED_OUTPATIENT_CLINIC_OR_DEPARTMENT_OTHER): Payer: Self-pay

## 2018-10-28 ENCOUNTER — Telehealth: Payer: Self-pay | Admitting: Internal Medicine

## 2018-10-28 DIAGNOSIS — G4733 Obstructive sleep apnea (adult) (pediatric): Secondary | ICD-10-CM

## 2018-10-28 NOTE — Telephone Encounter (Signed)
Called and spoke with patient. Confirmed date and time of phone visit  ° °

## 2018-10-29 ENCOUNTER — Encounter (HOSPITAL_COMMUNITY): Payer: Medicare Other | Admitting: Cardiology

## 2018-10-29 ENCOUNTER — Other Ambulatory Visit (HOSPITAL_COMMUNITY): Payer: Medicare Other

## 2018-10-29 DIAGNOSIS — I130 Hypertensive heart and chronic kidney disease with heart failure and stage 1 through stage 4 chronic kidney disease, or unspecified chronic kidney disease: Secondary | ICD-10-CM | POA: Diagnosis not present

## 2018-10-29 DIAGNOSIS — D631 Anemia in chronic kidney disease: Secondary | ICD-10-CM | POA: Diagnosis not present

## 2018-10-29 DIAGNOSIS — E1122 Type 2 diabetes mellitus with diabetic chronic kidney disease: Secondary | ICD-10-CM | POA: Diagnosis not present

## 2018-10-29 DIAGNOSIS — N183 Chronic kidney disease, stage 3 (moderate): Secondary | ICD-10-CM | POA: Diagnosis not present

## 2018-10-29 DIAGNOSIS — I2729 Other secondary pulmonary hypertension: Secondary | ICD-10-CM | POA: Diagnosis not present

## 2018-10-29 DIAGNOSIS — I5043 Acute on chronic combined systolic (congestive) and diastolic (congestive) heart failure: Secondary | ICD-10-CM | POA: Diagnosis not present

## 2018-10-29 NOTE — Procedures (Signed)
    Patient Name: Allen Dyer, Allen Dyer Date: 10/24/2018 Gender: Male D.O.B: 06/11/1935 Age (years): 44 Referring Provider: Melina Copa Height (inches): 15 Interpreting Physician: [name] MD, ABSM Weight (lbs): 213 RPSGT: Zadie Rhine BMI: 33 MRN: [id number] Neck Size: 17.00  CLINICAL INFORMATION The patient is referred for a BiPAP titration to treat sleep apnea.  SLEEP STUDY TECHNIQUE As per the AASM Manual for the Scoring of Sleep and Associated Events v2.3 (April 2016) with a hypopnea requiring 4% desaturations.  The channels recorded and monitored were frontal, central and occipital EEG, electrooculogram (EOG), submentalis EMG (chin), nasal and oral airflow, thoracic and abdominal wall motion, anterior tibialis EMG, snore microphone, electrocardiogram, and pulse oximetry. Bilevel positive airway pressure (BPAP) was initiated at the beginning of the study and titrated to treat sleep-disordered breathing.  MEDICATIONS Medications self-administered by patient taken the night of the study : COREG  RESPIRATORY PARAMETERS Optimal IPAP Pressure (cm): 19  AHI at Optimal Pressure (/hr) 0.0 Optimal EPAP Pressure (cm):15  Overall Minimal O2 (%):86.0  Minimal O2 at Optimal Pressure (%): 92.0  SLEEP ARCHITECTURE Start Time:10:44:32 PM  Stop Time:5:34:33 AM  Total Time (min):410  Total Sleep Time (min):311 Sleep Latency (min):4.0  Sleep Efficiency (%):75.8%  REM Latency (min):164.0  WASO (min): 95.0 Stage N1 (%): 2.9%  Stage N2 (%): 79.7%  Stage N3 (%): 0.2%  Stage R (%):17.2 Supine (%):100.00  Arousal Index (/hr):20.5   CARDIAC DATA The 2 lead EKG demonstrated sinus rhythm. The mean heart rate was 51.9 beats per minute. Other EKG findings include: Frequent PACs, nonsustained atrial tachycardia and PVCs.  LEG MOVEMENT DATA The total Periodic Limb Movements of Sleep (PLMS) were 0. The PLMS index was 0.0. A PLMS index of <15 is considered normal in adults.  IMPRESSIONS  - An optimal PAP pressure was selected for this patient ( 19/15 cm of water) - Mild Central Sleep Apnea was noted during this titration (CAI = 7.5/h). - Moderete oxygen desaturations were observed during this titration (min O2 = 86.0%). - No snoring was audible during this study. - Frequent PACs, nonsustained atrial tachycardia and PVCs were observed during this study. - Clinically significant periodic limb movements were not noted during this study. Arousals associated with PLMs were rare.  DIAGNOSIS - Obstructive Sleep Apnea (327.23 [G47.33 ICD-10]) - Nocturnal Hypoxemia - Nonsustained atrial tachycardia  RECOMMENDATIONS - Trial of BiPAP therapy on 19/15 cm H2O with a Medium size Fisher&Paykel Full Face Mask Simplus mask and heated humidification. - Avoid alcohol, sedatives and other CNS depressants that may worsen sleep apnea and disrupt normal sleep architecture. - Sleep hygiene should be reviewed to assess factors that may improve sleep quality. - Weight management and regular exercise should be initiated or continued. - Return to Sleep Center for re-evaluation after 10 weeks of therapy  [Electronically signed] 10/29/2018 09:18 PM  [name] MD, ABSM Diplomate, American Board of Sleep Medicine

## 2018-10-30 ENCOUNTER — Telehealth: Payer: Self-pay | Admitting: *Deleted

## 2018-10-30 NOTE — Telephone Encounter (Signed)
Informed patient of sleep study results and patient understanding was verbalized. Patient understands his sleep study showed they had a successful PAP titration and orders are in Epic. Upon patient request DME selection is [facility]. Patient understands he will be contacted by [facility] to set up his cpap. Patient understands to call if [facility] does not contact him with new setup in a timely manner. Patient understands they will be called once confirmation has been received from [facility] that they have received their new machine to schedule 10 week follow up appointment.  [facility] notified of new cpap order  Please add to airview Patient was grateful for the call and thanked me.

## 2018-10-30 NOTE — Telephone Encounter (Signed)
-----   Message from [name], MD sent at 10/29/2018  9:22 PM EDT ----- Please let patient know that they had a successful PAP titration and let DME know that orders are in EPIC.  Please set up 10 week OV with me.

## 2018-10-31 ENCOUNTER — Encounter (HOSPITAL_BASED_OUTPATIENT_CLINIC_OR_DEPARTMENT_OTHER): Payer: Medicare Other

## 2018-11-01 ENCOUNTER — Other Ambulatory Visit: Payer: Self-pay | Admitting: Radiology

## 2018-11-01 ENCOUNTER — Telehealth: Payer: Self-pay | Admitting: Internal Medicine

## 2018-11-01 ENCOUNTER — Telehealth: Payer: Self-pay

## 2018-11-01 DIAGNOSIS — C9000 Multiple myeloma not having achieved remission: Secondary | ICD-10-CM

## 2018-11-01 NOTE — Telephone Encounter (Signed)
Unable to reach pt - called per 7/10 sch message - schedule change

## 2018-11-01 NOTE — Telephone Encounter (Signed)
Contacted patient and made him aware that he will be getting a call from the schedulers to reschedule the phone appt from 7/16 to 7/24 to discuss the results of the bone marrow biopsy on the 13th. Dr. Walden Field wants to push back the appt to make sure the results are back for review. Patient verbalized understanding. Scheduling message sent.

## 2018-11-04 ENCOUNTER — Telehealth: Payer: Self-pay | Admitting: *Deleted

## 2018-11-04 ENCOUNTER — Ambulatory Visit (HOSPITAL_COMMUNITY)
Admission: RE | Admit: 2018-11-04 | Discharge: 2018-11-04 | Disposition: A | Discharge status: Home / Self Care | Payer: Medicare Other | Source: Ambulatory Visit | Attending: Internal Medicine | Admitting: Internal Medicine

## 2018-11-04 ENCOUNTER — Encounter (HOSPITAL_COMMUNITY): Payer: Self-pay

## 2018-11-04 ENCOUNTER — Other Ambulatory Visit: Payer: Self-pay

## 2018-11-04 DIAGNOSIS — E1142 Type 2 diabetes mellitus with diabetic polyneuropathy: Secondary | ICD-10-CM | POA: Diagnosis not present

## 2018-11-04 DIAGNOSIS — D472 Monoclonal gammopathy: Secondary | ICD-10-CM | POA: Diagnosis not present

## 2018-11-04 DIAGNOSIS — Z8249 Family history of ischemic heart disease and other diseases of the circulatory system: Secondary | ICD-10-CM | POA: Diagnosis not present

## 2018-11-04 DIAGNOSIS — Z79899 Other long term (current) drug therapy: Secondary | ICD-10-CM | POA: Diagnosis not present

## 2018-11-04 DIAGNOSIS — Z7982 Long term (current) use of aspirin: Secondary | ICD-10-CM | POA: Insufficient documentation

## 2018-11-04 DIAGNOSIS — E785 Hyperlipidemia, unspecified: Secondary | ICD-10-CM | POA: Diagnosis not present

## 2018-11-04 DIAGNOSIS — I5032 Chronic diastolic (congestive) heart failure: Secondary | ICD-10-CM | POA: Diagnosis not present

## 2018-11-04 DIAGNOSIS — I130 Hypertensive heart and chronic kidney disease with heart failure and stage 1 through stage 4 chronic kidney disease, or unspecified chronic kidney disease: Secondary | ICD-10-CM | POA: Diagnosis not present

## 2018-11-04 DIAGNOSIS — R768 Other specified abnormal immunological findings in serum: Secondary | ICD-10-CM | POA: Diagnosis not present

## 2018-11-04 DIAGNOSIS — N183 Chronic kidney disease, stage 3 (moderate): Secondary | ICD-10-CM | POA: Insufficient documentation

## 2018-11-04 DIAGNOSIS — D649 Anemia, unspecified: Secondary | ICD-10-CM | POA: Diagnosis not present

## 2018-11-04 DIAGNOSIS — D4989 Neoplasm of unspecified behavior of other specified sites: Secondary | ICD-10-CM | POA: Diagnosis not present

## 2018-11-04 DIAGNOSIS — Z8546 Personal history of malignant neoplasm of prostate: Secondary | ICD-10-CM | POA: Diagnosis not present

## 2018-11-04 DIAGNOSIS — E1122 Type 2 diabetes mellitus with diabetic chronic kidney disease: Secondary | ICD-10-CM | POA: Insufficient documentation

## 2018-11-04 DIAGNOSIS — I428 Other cardiomyopathies: Secondary | ICD-10-CM | POA: Diagnosis not present

## 2018-11-04 LAB — CBC WITH DIFFERENTIAL/PLATELET
Abs Immature Granulocytes: 0.01 10*3/uL (ref 0.00–0.07)
Basophils Absolute: 0 10*3/uL (ref 0.0–0.1)
Basophils Relative: 0 %
Eosinophils Absolute: 0.3 10*3/uL (ref 0.0–0.5)
Eosinophils Relative: 6 %
HCT: 35.4 % — ABNORMAL LOW (ref 39.0–52.0)
Hemoglobin: 11.6 g/dL — ABNORMAL LOW (ref 13.0–17.0)
Immature Granulocytes: 0 %
Lymphocytes Relative: 26 %
Lymphs Abs: 1.3 10*3/uL (ref 0.7–4.0)
MCH: 32.2 pg (ref 26.0–34.0)
MCHC: 32.8 g/dL (ref 30.0–36.0)
MCV: 98.3 fL (ref 80.0–100.0)
Monocytes Absolute: 0.6 10*3/uL (ref 0.1–1.0)
Monocytes Relative: 12 %
Neutro Abs: 2.9 10*3/uL (ref 1.7–7.7)
Neutrophils Relative %: 56 %
Platelets: 157 10*3/uL (ref 150–400)
RBC: 3.6 MIL/uL — ABNORMAL LOW (ref 4.22–5.81)
RDW: 14.2 % (ref 11.5–15.5)
WBC: 5.1 10*3/uL (ref 4.0–10.5)
nRBC: 0 % (ref 0.0–0.2)

## 2018-11-04 LAB — BASIC METABOLIC PANEL
Anion gap: 9 (ref 5–15)
BUN: 42 mg/dL — ABNORMAL HIGH (ref 8–23)
CO2: 29 mmol/L (ref 22–32)
Calcium: 9.1 mg/dL (ref 8.9–10.3)
Chloride: 99 mmol/L (ref 98–111)
Creatinine, Ser: 2.29 mg/dL — ABNORMAL HIGH (ref 0.61–1.24)
GFR calc Af Amer: 30 mL/min — ABNORMAL LOW (ref >60)
GFR calc non Af Amer: 26 mL/min — ABNORMAL LOW (ref >60)
Glucose, Bld: 94 mg/dL (ref 70–99)
Potassium: 5.5 mmol/L — ABNORMAL HIGH (ref 3.5–5.1)
Sodium: 137 mmol/L (ref 135–145)

## 2018-11-04 LAB — GLUCOSE, CAPILLARY: Glucose-Capillary: 104 mg/dL — ABNORMAL HIGH (ref 70–99)

## 2018-11-04 LAB — PROTIME-INR
INR: 1.1 (ref 0.8–1.2)
Prothrombin Time: 14 seconds (ref 11.4–15.2)

## 2018-11-04 MED ORDER — SODIUM CHLORIDE 0.9 % IV SOLN
INTRAVENOUS | Status: DC
  Administered 2018-11-04: 10:00:00 via INTRAVENOUS

## 2018-11-04 MED ORDER — NALOXONE HCL 0.4 MG/ML IJ SOLN
INTRAMUSCULAR | Status: AC
  Filled 2018-11-04: qty 1

## 2018-11-04 MED ORDER — FLUMAZENIL 0.5 MG/5ML IV SOLN
INTRAVENOUS | Status: AC
  Filled 2018-11-04: qty 5

## 2018-11-04 MED ORDER — MIDAZOLAM HCL 2 MG/2ML IJ SOLN
INTRAMUSCULAR | Status: AC
  Filled 2018-11-04: qty 4

## 2018-11-04 MED ORDER — FENTANYL CITRATE (PF) 100 MCG/2ML IJ SOLN
INTRAMUSCULAR | Status: AC | PRN
  Administered 2018-11-04: 25 ug via INTRAVENOUS
  Administered 2018-11-04: 50 ug via INTRAVENOUS

## 2018-11-04 MED ORDER — MIDAZOLAM HCL 2 MG/2ML IJ SOLN
INTRAMUSCULAR | Status: AC | PRN
  Administered 2018-11-04 (×2): 0.5 mg via INTRAVENOUS
  Administered 2018-11-04: 1 mg via INTRAVENOUS

## 2018-11-04 MED ORDER — LIDOCAINE HCL (PF) 1 % IJ SOLN
INTRAMUSCULAR | Status: AC | PRN
  Administered 2018-11-04: 10 mL

## 2018-11-04 MED ORDER — FENTANYL CITRATE (PF) 100 MCG/2ML IJ SOLN
INTRAMUSCULAR | Status: AC
  Filled 2018-11-04: qty 2

## 2018-11-04 NOTE — Discharge Instructions (Signed)
Bone Marrow Aspiration and Bone Marrow Biopsy, Adult, Care After °This sheet gives you information about how to care for yourself after your procedure. Your health care provider may also give you more specific instructions. If you have problems or questions, contact your health care provider. °What can I expect after the procedure? °After the procedure, it is common to have: °· Mild pain and tenderness. °· Swelling. °· Bruising. °Follow these instructions at home: °Puncture site care ° °  ° °· Follow instructions from your health care provider about how to take care of the puncture site. Make sure you: °? Wash your hands with soap and water before you change your bandage (dressing). If soap and water are not available, use hand sanitizer. °? Change your dressing as told by your health care provider. °· Check your puncture site every day for signs of infection. Check for: °? More redness, swelling, or pain. °? More fluid or blood. °? Warmth. °? Pus or a bad smell. °General instructions °· Take over-the-counter and prescription medicines only as told by your health care provider. °· Do not take baths, swim, or use a hot tub until your health care provider approves. Ask if you can take a shower or have a sponge bath. °· Return to your normal activities as told by your health care provider. Ask your health care provider what activities are safe for you. °· Do not drive for 24 hours if you were given a medicine to help you relax (sedative) during your procedure. °· Keep all follow-up visits as told by your health care provider. This is important. °Contact a health care provider if: °· Your pain is not controlled with medicine. °Get help right away if: °· You have a fever. °· You have more redness, swelling, or pain around the puncture site. °· You have more fluid or blood coming from the puncture site. °· Your puncture site feels warm to the touch. °· You have pus or a bad smell coming from the puncture site. °These  symptoms may represent a serious problem that is an emergency. Do not wait to see if the symptoms will go away. Get medical help right away. Call your local emergency services (911 in the [place] Do not drive yourself to the hospital. °Summary °· After the procedure, it is common to have mild pain, tenderness, swelling, and bruising. °· Follow instructions from your health care provider about how to take care of the puncture site. °· Get help right away if you have any symptoms of infection or if you have more blood or fluid coming from the puncture site. °This information is not intended to replace advice given to you by your health care provider. Make sure you discuss any questions you have with your health care provider. °Document Released: 10/28/2004 Document Revised: 07/24/2017 Document Reviewed: 09/22/2015 °Elsevier Patient Education © 2020 Elsevier Inc. ° ° ° ° °Moderate Conscious Sedation, Adult, Care After °These instructions provide you with information about caring for yourself after your procedure. Your health care provider may also give you more specific instructions. Your treatment has been planned according to current medical practices, but problems sometimes occur. Call your health care provider if you have any problems or questions after your procedure. °What can I expect after the procedure? °After your procedure, it is common: °· To feel sleepy for several hours. °· To feel clumsy and have poor balance for several hours. °· To have poor judgment for several hours. °· To vomit if you eat too soon. °  Follow these instructions at home: °For at least 24 hours after the procedure: ° °· Do not: °? Participate in activities where you could fall or become injured. °? Drive. °? Use heavy machinery. °? Drink alcohol. °? Take sleeping pills or medicines that cause drowsiness. °? Make important decisions or sign legal documents. °? Take care of children on your own. °· Rest. °Eating and drinking °· Follow the  diet recommended by your health care provider. °· If you vomit: °? Drink water, juice, or soup when you can drink without vomiting. °? Make sure you have little or no nausea before eating solid foods. °General instructions °· Have a responsible adult stay with you until you are awake and alert. °· Take over-the-counter and prescription medicines only as told by your health care provider. °· If you smoke, do not smoke without supervision. °· Keep all follow-up visits as told by your health care provider. This is important. °Contact a health care provider if: °· You keep feeling nauseous or you keep vomiting. °· You feel light-headed. °· You develop a rash. °· You have a fever. °Get help right away if: °· You have trouble breathing. °This information is not intended to replace advice given to you by your health care provider. Make sure you discuss any questions you have with your health care provider. °Document Released: 01/29/2013 Document Revised: 03/23/2017 Document Reviewed: 07/31/2015 °Elsevier Patient Education © 2020 Elsevier Inc. ° °

## 2018-11-04 NOTE — Procedures (Signed)
Interventional Radiology Procedure: ? ? ?Indications: Monoclonal gammopathy ? ?Procedure: CT guided bone marrow biopsy ? ?Findings: 2 aspirates and 1 core from right ilium ? ?Complications: None ?    ?EBL: Minimal, less than 10 ml ? ?Plan: Discharge to home in one hour. ? ? ?Allen Dyer R. Allen Cataldo, MD  ?Pager: [phone] ? ?  ?

## 2018-11-04 NOTE — Progress Notes (Signed)
Informed patient of sleep study results and patient understanding was verbalized. Patient understands his sleep study showed they had a successful PAP titration and orders are in Epic. Upon patient request DME selection is [facility]. Patient understands he will be contacted by [facility] to set up his cpap. Patient understands to call if [facility] does not contact him with new setup in a timely manner. Patient understands they will be called once confirmation has been received from [facility] that they have received their new machine to schedule 10 week follow up appointment.

## 2018-11-04 NOTE — H&P (Signed)
Chief Complaint: Patient was seen in consultation today for monoclonal gammopathy  Referring Physician(s): Higgs,Vetta  Supervising Physician: Markus Daft  Patient Status: [facility] - Out-pt  History of Present Illness: Allen Dyer is a 83 y.o. male with past medical history of CKD, HTN, HLD, DM2, and prostate cancer recently found to have elevated IgA level.  He is referred to [facility] today for bone marrow biopsy at the request of Dr. Walden Field.   Patient presents today in his usual state of health. Denies fever, chills, nausea, vomiting, abdominal pain, cough, shortness of breath, dysuria.  He has been NPO.    Past Medical History:  Diagnosis Date   Chronic combined systolic and diastolic CHF (congestive heart failure) (HCC)    CKD (chronic kidney disease), stage III (HCC)    Hyperlipidemia    Hypertension    Lower extremity edema    Mild CAD    a. mild-mod by cath 05/2018.   NICM (nonischemic cardiomyopathy) ([place])    Peripheral neuropathy    Prostate cancer ([place])    Status post XRT   Rheumatic fever    Trifascicular block    Type 2 diabetes mellitus [facility])     Past Surgical History:  Procedure Laterality Date   RIGHT HEART CATH N/A 07/10/2018   Procedure: RIGHT HEART CATH;  Surgeon: Larey Dresser, MD;  Location: [facility];  Service: Cardiovascular;  Laterality: N/A;   RIGHT/LEFT HEART CATH AND CORONARY ANGIOGRAPHY N/A 06/18/2018   Procedure: RIGHT/LEFT HEART CATH AND CORONARY ANGIOGRAPHY;  Surgeon: Troy Sine, MD;  Location: [facility] [facility];  Service: Cardiovascular;  Laterality: N/A;    Allergies: Tramadol, Finasteride, Lisinopril, Ciprofloxacin, Simvastatin, and Sulfa antibiotics  Medications: Prior to Admission medications   Medication Sig Start Date End Date Taking? Authorizing Provider  aspirin EC 81 MG tablet Take 81 mg by mouth every evening.     [provider]  carvedilol (COREG) 6.25 MG tablet Take 1.5  tablets (9.375 mg total) by mouth 2 (two) times daily. 08/08/18   Georgiana Shore, NP  Cholecalciferol (VITAMIN D3 PO) Take 1 tablet by mouth daily.     [provider]  Cyanocobalamin (B-12 PO) Take 1 tablet by mouth daily.     [provider]  gabapentin (NEURONTIN) 800 MG tablet Take 800 mg by mouth 2 (two) times daily.     [provider]  glipiZIDE (GLUCOTROL) 10 MG tablet Take 10 mg by mouth 2 (two) times daily.     [provider]  hydrALAZINE (APRESOLINE) 25 MG tablet Take 1 tablet (25 mg total) by mouth 3 (three) times daily. 07/10/18   Oswald Hillock, MD  isosorbide mononitrate (IMDUR) 30 MG 24 hr tablet Take 1 tablet (30 mg total) by mouth daily. 07/11/18   Oswald Hillock, MD  Multiple Vitamin (MULTIVITAMIN WITH MINERALS) TABS tablet Take 1 tablet by mouth 2 (two) times daily.    [provider]  oxybutynin (DITROPAN-XL) 5 MG 24 hr tablet Take 5 mg by mouth daily.     [provider]  PRECISION XTRA TEST STRIPS test strip  03/10/17   [provider]  spironolactone (ALDACTONE) 25 MG tablet Take 1 tablet (25 mg total) by mouth at bedtime. 07/22/18   Georgiana Shore, NP  tamsulosin (FLOMAX) 0.4 MG CAPS capsule Take 0.4 mg by mouth 2 (two) times daily.  03/24/17   [provider]  torsemide (DEMADEX) 20 MG tablet Take 2 tablets (40  mg total) by mouth 2 (two) times daily. 07/10/18   Oswald Hillock, MD     Family History  Problem Relation Age of Onset   Congestive Heart Failure Father        died from heart failure at the age of 85   Cancer Mother    Cancer Sister    Congestive Heart Failure Brother        died from heart failure at the age of 11    Social History   Socioeconomic History   Marital status: Widowed    Spouse name: Not on file   Number of children: Not on file   Years of education: Not on file   Highest education level: Not on file  Occupational History   Not on file  Social Needs    Financial resource strain: Not on file   Food insecurity    Worry: Not on file    Inability: Not on file   Transportation needs    Medical: Not on file    Non-medical: Not on file  Tobacco Use   Smoking status: Never Smoker   Smokeless tobacco: Never Used  Substance and Sexual Activity   Alcohol use: Never    Frequency: Never   Drug use: Never   Sexual activity: Not on file  Lifestyle   Physical activity    Days per week: Not on file    Minutes per session: Not on file   Stress: Not on file  Relationships   Social connections    Talks on phone: Not on file    Gets together: Not on file    Attends religious service: Not on file    Active member of club or organization: Not on file    Attends meetings of clubs or organizations: Not on file    Relationship status: Not on file  Other Topics Concern   Not on file  Social History Narrative   Not on file     Review of Systems: A 12 point ROS discussed and pertinent positives are indicated in the HPI above.  All other systems are negative.  Review of Systems  Constitutional: Negative for fatigue and fever.  Respiratory: Negative for cough and shortness of breath.   Cardiovascular: Negative for chest pain.  Gastrointestinal: Negative for abdominal pain, diarrhea, nausea and vomiting.  Musculoskeletal: Negative for back pain.  Psychiatric/Behavioral: Negative for behavioral problems and confusion.    Vital Signs: BP 122/78 (BP Location: Right Arm)    Pulse 61    Temp 98.6 F (37 C) (Oral)    Resp 18    SpO2 96%   Physical Exam Vitals signs and nursing note reviewed.  Constitutional:      Appearance: Normal appearance.  HENT:     Mouth/Throat:     Mouth: Mucous membranes are moist.     Pharynx: Oropharynx is clear.  Cardiovascular:     Rate and Rhythm: Normal rate and regular rhythm.     Pulses: Normal pulses.     Heart sounds: Normal heart sounds. No murmur. No friction rub.  Pulmonary:     Effort:  Pulmonary effort is normal. No respiratory distress.     Breath sounds: Normal breath sounds.  Abdominal:     General: Abdomen is flat.     Palpations: Abdomen is soft.  Skin:    General: Skin is warm and dry.  Neurological:     General: No focal deficit present.     Mental Status: He  is alert and oriented to person, place, and time. Mental status is at baseline.  Psychiatric:        Mood and Affect: Mood normal.        Behavior: Behavior normal.        Thought Content: Thought content normal.        Judgment: Judgment normal.      MD Evaluation Airway: WNL Heart: WNL Abdomen: WNL Chest/ Lungs: WNL ASA  Classification: 3 Mallampati/Airway Score: One   Imaging: Dg Bone Survey Met  Result Date: 10/18/2018 CLINICAL DATA:  Prostate cancer. Monoclonal gammopathy. Bilateral hip pain. EXAM: METASTATIC BONE SURVEY COMPARISON:  Chest x-ray 07/09/2018. FINDINGS: Multiple images of the axial and appendicular skeleton performed. Chest x-ray reveals severe cardiomegaly. No pulmonary venous congestion. Previously identified changes of CHF has resolved. Diffuse severe degenerative changes noted throughout the cervical, thoracic, and lumbar spine. Mild thoracic scoliosis. Degenerative changes noted about both shoulders. Degenerative changes about both hips. Prostate seeds noted. Degenerative changes noted about both knees and ankles. No acute or focal bony abnormality identified. No evidence of metastatic disease or myeloma. IMPRESSION: 1. Interim clearing of congestive heart failure. Persistent cardiomegaly. 2.  No evidence of metastatic disease or myeloma. Electronically Signed   By: Marcello Moores  Register   On: 10/18/2018 06:18    Labs:  CBC: Recent Labs    07/03/18 1155 07/03/18 1958 07/10/18 0337 07/10/18 1024 10/10/18 1404  WBC 5.4 5.5 6.5  --  4.9  HGB 9.8* 10.1* 9.6* 9.5*   9.5* 11.0*  HCT 32.9* 33.5* 30.4* 28.0*   28.0* 34.6*  PLT 194 198 185  --  176    COAGS: No results for  input(s): INR, APTT in the last 8760 hours.  BMP: Recent Labs    07/08/18 0606 07/09/18 0420 07/10/18 0337 07/10/18 1024 10/10/18 1404  NA 137 135 140 139   139 141  K 3.6 4.0 3.7 3.6   3.6 3.9  CL 94* 93* 96*  --  101  CO2 33* 32 38*  --  32  GLUCOSE 180* 168* 100*  --  120*  BUN 25* 33* 41*  --  31*  CALCIUM 9.2 9.5 9.5  --  9.6  CREATININE 2.09* 2.04* 2.15*  --  2.06*  GFRNONAA 29* 30* 28*  --  29*  GFRAA 33* 34* 32*  --  34*    LIVER FUNCTION TESTS: Recent Labs    07/03/18 1120 10/10/18 1404  BILITOT 0.7 0.4  AST 40 20  ALT 59* 16  ALKPHOS 74 84  PROT 6.9 7.3  ALBUMIN 3.2* 3.4*    TUMOR MARKERS: No results for input(s): AFPTM, CEA, CA199, CHROMGRNA in the last 8760 hours.  Assessment and Plan: Patient with past medical history of DM, HTN, HLD, CAD, prostate cancer presents with complaint of monoclonal gammopathy/elevated IgA.  IR consulted for bone marrow biopsy at the request of Dr. Walden Field. Case reviewed by Dr. Anselm Pancoast who approves patient for procedure.  Patient presents today in their usual state of health.  She has been NPO and is not currently on blood thinners.   Risks and benefits of biopsy were discussed with the patient and/or patient's family including, but not limited to bleeding, infection, damage to adjacent structures or low yield requiring additional tests.  All of the questions were answered and there is agreement to proceed.  Consent signed and in chart.  Thank you for this interesting consult.  I greatly enjoyed meeting Allen Dyer and look forward to  participating in their care.  A copy of this report was sent to the requesting provider on this date.  Electronically Signed: Docia Barrier, PA 11/04/2018, 10:18 AM   I spent a total of  30 Minutes   in face to face in clinical consultation, greater than 50% of which was counseling/coordinating care for monoclonal gammopathy.

## 2018-11-04 NOTE — Telephone Encounter (Signed)
-----   Message from [name], MD sent at 10/29/2018  9:22 PM EDT ----- Please let patient know that they had a successful PAP titration and let DME know that orders are in EPIC.  Please set up 10 week OV with me.

## 2018-11-04 NOTE — Telephone Encounter (Signed)
Informed patient of sleep study results and patient understanding was verbalized. Patient understands his sleep study showed they had a successful PAP titration and orders are in Epic. Upon patient request DME selection is [facility]. Patient understands he will be contacted by [facility] to set up his cpap. Patient understands to call if [facility] does not contact him with new setup in a timely manner. Patient understands they will be called once confirmation has been received from [facility] that they have received their new machine to schedule 10 week follow up appointment.

## 2018-11-05 DIAGNOSIS — I2729 Other secondary pulmonary hypertension: Secondary | ICD-10-CM | POA: Diagnosis not present

## 2018-11-05 DIAGNOSIS — I130 Hypertensive heart and chronic kidney disease with heart failure and stage 1 through stage 4 chronic kidney disease, or unspecified chronic kidney disease: Secondary | ICD-10-CM | POA: Diagnosis not present

## 2018-11-05 DIAGNOSIS — N183 Chronic kidney disease, stage 3 (moderate): Secondary | ICD-10-CM | POA: Diagnosis not present

## 2018-11-05 DIAGNOSIS — E1122 Type 2 diabetes mellitus with diabetic chronic kidney disease: Secondary | ICD-10-CM | POA: Diagnosis not present

## 2018-11-05 DIAGNOSIS — D631 Anemia in chronic kidney disease: Secondary | ICD-10-CM | POA: Diagnosis not present

## 2018-11-05 DIAGNOSIS — I5043 Acute on chronic combined systolic (congestive) and diastolic (congestive) heart failure: Secondary | ICD-10-CM | POA: Diagnosis not present

## 2018-11-07 ENCOUNTER — Ambulatory Visit: Payer: Medicare Other | Admitting: Internal Medicine

## 2018-11-12 ENCOUNTER — Encounter (HOSPITAL_COMMUNITY): Payer: Self-pay | Admitting: Internal Medicine

## 2018-11-13 ENCOUNTER — Encounter (HOSPITAL_COMMUNITY): Payer: Self-pay | Admitting: Internal Medicine

## 2018-11-14 ENCOUNTER — Telehealth: Payer: Self-pay | Admitting: Internal Medicine

## 2018-11-14 NOTE — Telephone Encounter (Signed)
Patient has a 10 week follow up appointment scheduled for 01-13-19. Patient understands he needs to keep this appointment for insurance compliance. Patient was grateful for the call and thanked me.

## 2018-11-14 NOTE — Telephone Encounter (Signed)
Unable to leave voicemail to confirm appt and verify info.

## 2018-11-15 ENCOUNTER — Telehealth: Payer: Self-pay | Admitting: *Deleted

## 2018-11-15 ENCOUNTER — Other Ambulatory Visit: Payer: Self-pay

## 2018-11-15 ENCOUNTER — Telehealth: Payer: Self-pay | Admitting: Pharmacist

## 2018-11-15 ENCOUNTER — Inpatient Hospital Stay (HOSPITAL_BASED_OUTPATIENT_CLINIC_OR_DEPARTMENT_OTHER): Payer: Medicare Other | Admitting: Internal Medicine

## 2018-11-15 ENCOUNTER — Encounter: Payer: Self-pay | Admitting: Radiology

## 2018-11-15 VITALS — BP 118/62 | HR 67 | Temp 98.7°F | Resp 18 | Ht 67.0 in | Wt 213.4 lb

## 2018-11-15 DIAGNOSIS — N183 Chronic kidney disease, stage 3 (moderate): Secondary | ICD-10-CM | POA: Diagnosis not present

## 2018-11-15 DIAGNOSIS — Z8546 Personal history of malignant neoplasm of prostate: Secondary | ICD-10-CM

## 2018-11-15 DIAGNOSIS — Z923 Personal history of irradiation: Secondary | ICD-10-CM | POA: Diagnosis not present

## 2018-11-15 DIAGNOSIS — D649 Anemia, unspecified: Secondary | ICD-10-CM

## 2018-11-15 DIAGNOSIS — D472 Monoclonal gammopathy: Secondary | ICD-10-CM | POA: Insufficient documentation

## 2018-11-15 DIAGNOSIS — Z7982 Long term (current) use of aspirin: Secondary | ICD-10-CM | POA: Diagnosis not present

## 2018-11-15 DIAGNOSIS — I129 Hypertensive chronic kidney disease with stage 1 through stage 4 chronic kidney disease, or unspecified chronic kidney disease: Secondary | ICD-10-CM

## 2018-11-15 DIAGNOSIS — E1122 Type 2 diabetes mellitus with diabetic chronic kidney disease: Secondary | ICD-10-CM | POA: Diagnosis not present

## 2018-11-15 DIAGNOSIS — Z79899 Other long term (current) drug therapy: Secondary | ICD-10-CM | POA: Diagnosis not present

## 2018-11-15 DIAGNOSIS — C9000 Multiple myeloma not having achieved remission: Secondary | ICD-10-CM | POA: Diagnosis not present

## 2018-11-15 DIAGNOSIS — I5043 Acute on chronic combined systolic (congestive) and diastolic (congestive) heart failure: Secondary | ICD-10-CM | POA: Diagnosis not present

## 2018-11-15 DIAGNOSIS — I130 Hypertensive heart and chronic kidney disease with heart failure and stage 1 through stage 4 chronic kidney disease, or unspecified chronic kidney disease: Secondary | ICD-10-CM | POA: Diagnosis not present

## 2018-11-15 DIAGNOSIS — Z87891 Personal history of nicotine dependence: Secondary | ICD-10-CM | POA: Diagnosis not present

## 2018-11-15 HISTORY — DX: Multiple myeloma not having achieved remission: C90.00

## 2018-11-15 NOTE — Telephone Encounter (Signed)
Oral Oncology Pharmacist Encounter  I called the [employer] prior authorization department at [phone] to initiate insurance authorization request for Revlimid.  Address for Dr. Walden Field updated to in the [employer] system.  Insurance authorization for Revlimid capsules up to a days supply of 21 capsules per 28 days has been approved. Case ID: [id number] Effective dates: 10/16/18-04/23/98  Representative stated we would be receiving a fax determination of insurance authorization approval.  Johny Drilling, PharmD, BCPS, BCOP  11/15/2018 3:20 PM Oral Oncology Clinic [phone]

## 2018-11-15 NOTE — Telephone Encounter (Signed)
Oral Oncology Pharmacist Encounter  Received notification from MD that she planns to initiate Revlimid (lenalidomide) for the induction treatment of multiple myeloma not having acheived remission in conjunction with dexamethasone and bortezomib, planned duration 4-6 cycles of induction therapy and then reassessment.  Prescription has not yet been written. Per MD, Revlimid is planned to be administered at 80m by mouth once daily for 14 days on 14 days off, repeat every 28 days.  Labs from 11/04/18 assessed, OK for treatment initiation. SCr=2.29, est CrCl ~ 30 mL/min, Revlimid dose is planned to be appropriately decreased due to renal dysfunction  Current medication list in Epic reviewed, no DDIs with Revlimid identified.  Patient with aspirin 81 mg on medication list, I will ensure patient remains on this medication for thromboprophylaxis  No prescription for acyclovir noted on medication list. Patient will need prescription for acyclovir dose at 4021monce daily or 20028mwice daily for VZV prophylaxis due to the use of a proteosome inhibitor. Dose has been reduced for renal dysfunction. I will follow-up with MD to ensure this prescription is sent to patient's local pharmacy.  Prescription for Revlimid will be e-scribed to appropriate specialty pharmacy for dispensing once insurance authorization is approved and [employer] authorization number is obtained. Revlimid is not [facility] it is a limited distribution medication.  Oral Oncology Clinic will continue to follow for insurance authorization, copayment issues, initial counseling and start date.  JesJohny DrillingharmD, BCPS, BCOP  11/15/2018 2:30 PM Oral Oncology Clinic [phone]

## 2018-11-15 NOTE — Telephone Encounter (Signed)
       TCT                                   

## 2018-11-15 NOTE — Progress Notes (Signed)
Diagnosis Multiple myeloma not having achieved remission ([place]) - Plan: CBC with Differential ([place] Only), CMP ([place] only), Lactate dehydrogenase (LDH), SPEP with reflex to IFE, Kappa/lambda light chains, QIG  (Quant. immunoglobulins  - IgG, IgA, IgM)  Staging Cancer Staging No matching staging information was found for the patient.  Assessment and Plan:  1.  Multiple myeloma. 83 year old male referred for evaluation due to monoclonal gammopathy.  Pt reports history of chronic kidney disease and joint pain.  He has history of prostate cancer.  Pt had labs done 09/03/2018 with SPEP that showed M spike measuring 0.4 g/dl.  Immunofixation showed IGA monoclonal protein with kappa light chain specificity.  Chemistries showed Cr 1.75 K+ 3.6.  Random UPEP showed no monoclonal protein.  Quant IG showed elevated IGA of 710.  He had normal IGG of 1288 and IGM of 34.  Pt reports sister with history of bone cancer.    Labs done  10/10/2018 reviewed and showed WBC 4000 HB 11 plts 176,000.  Chemistries showed K+ 3.9 Cr 2.06 normal LFTs, normal calcium of 9.6.  Ferritin WNL at 66.   SPEP negative for monoclonal protein.  He has elevated IGA level of 670 with normal IGG and IGM.  Serum FLC shows Kappa light chain of 119.2, lambda light chain of 25.2 with FLC ratio of 4.67.    Skeletal survey done 10/17/2018 shows cardiomegaly, but no metastatic or myeloma bone lesions.    Bone marrow biopsy was recommended due to findings of RI and elevated FLC ratio with previous SPEP showing monoclonal protein and elevated IGA level and was done  11/04/2018 and showed  Diagnosis Bone Marrow, Aspirate,Biopsy, and Clot, right ilium - PLASMA CELL NEOPLASM, SEE COMMENT. - ATYPICAL MEGAKARYOCYTES. PERIPHERAL BLOOD: - NORMOCYTIC ANEMIA. Diagnosis Note The marrow is mildly hypercellular for age with increased plasma cells (15% by CD138 immunohistochemistry). These findings are consistent with a plasma cell  neoplasm.  Myeloma FISH panel is ABNORMAL and showed DEL 13q.  He has normal cytogenetics.    In the past Deletion of 13 q was considered an adverse prognostic marker but due to evolving therapies it is now considered standard risk.  Long talk held with pt and I also spoke with daughter Verdene Lennert separately.  I discussed with them that based on the marrow findings of 15% plasma cells along with renal failure with recent Cr 2.29 he meets criteria for Myeloma.  Prognosis depends on age, performance status, co-morbidities and response to therapy.    Due to his age of pt and prior history of prostate cancer, pt may not be a candidate for transplant, but he will be referred to Dr. Norma Fredrickson at [facility] for evaluation prior to therapy initiation.  I discussed with them option of standard induction regimen with Velcade dosed at 1.3 mg/m2 [place] D1,8 15 along with Revlimid 10 mg po daily D1-14 and Decadron 40 mg weekly day 1, 8 and 15. Clinical trials have shown VRd to increase overall response rates, progression free survival and overall survival.   Side effects of VRd include peripheral neuropathy, panycytopenia, fatigue, hyperglycemia and GI symptoms.  Pt would be recommended for antiviral and thrombosis prophylaxis with ASA and Acyclovir.  Due to Renal insufficiency, dose of Revlimid would be reduced.   Usually after induction therapy patients are recommended for maintenance therapy with Revlimid to delay progression if not transplant eligible.    Pt has undergone HIV testing which is negative.  He has a + Hepatitis B core  antibody that indicates prior exposure.  Pt will be set up for follow-up in 11/2018 once [facility] evaluation has been completed.  All questions answered.  They were provided written information regarding diagnosis and treatment.    2.  Anemia.  Labs done  10/10/2018 show HB WNL at 11.  He has normal SPEP, HB electrophoresis, iron studies, B12, folate, MMA.  Pt has RI and elevated FLC ratio. Bone  marrow biopsy shows 15% plasma cells.     3.  Joint pain.  He has normal SPEP, ANA, RF and skeletal survey.  Pt has slightly elevated sed rate of 44.  Follow-up with PCP if ongoing symptoms.   4.  Prostate Cancer.  Pt reports he was treated with RT.  He is followed by Urology and should follow-up as directed.  Recent skeletal survey done 10/17/2018 shows no bone lesions.    5.  Chronic kidney disease.  Labs done  10/10/2018 reviewed and showed K+ 3.9 Cr 2 Calcium normal at 9.6 normal LFTs.  Pt has been referred to nephrology.   Greater than 25 minutes spent with more than 50% spent in review of records, counseling and coordination of care.    Interval History:  Historical data obtained from note dated 10/10/2018.  83 year old male referred for evaluation due to monoclonal gammopathy.  Pt reports history of chronic kidney disease and joint pain.  He has history of prostate cancer.  Pt had labs done 09/03/2018 with SPEP that showed M spike measuring 0.4 g/dl.  Immunofixation showed IGA monoclonal protein with kappa light chain specificity.  Chemistries showed Cr 1.75 K+ 3.6.  Random UPEP showed no monoclonal protein.  Quant IG showed elevated IGA of 710.  He had normal IGG of 1288 and IGM of 34.  Pt reports sister with history of bone cancer.   Current Status:  Pt is seen today for follow-up.  He is here to go over bone marrow biopsy results.    Oncology History  Multiple myeloma ([place])  11/15/2018 Initial Diagnosis   Multiple myeloma ([place])      Problem List Patient Active Problem List   Diagnosis Date Noted  . Multiple myeloma ([place]) [C90.00] 11/15/2018  . Pain due to onychomycosis of toenails of both feet [B35.1, M79.675, M79.674] 10/18/2018  . Type 2 diabetes mellitus with vascular disease ([place]) [E11.59] 10/18/2018  . Acute on chronic combined systolic and diastolic CHF (congestive heart failure) ([place]) [I50.43]   . Acute on chronic congestive heart failure ([place]) [I50.9] 07/04/2018  . CHF  exacerbation ([place]) [I50.9] 07/03/2018  . AKI (acute kidney injury) ([place]) [N17.9] 07/03/2018  . Acute CHF (congestive heart failure) ([place]) [I50.9] 06/18/2018  . NICM (nonischemic cardiomyopathy) ([place]) [I42.8]   . Pulmonary hypertension ([place]) [I27.20]   . Essential hypertension [I10] 06/14/2018  . Type 2 diabetes mellitus ([place]) [E11.9]   . Prostate cancer ([place]) [C61]   . Rheumatic fever [I00]   . Peripheral neuropathy [G62.9]   . Lower extremity edema [R60.0]   . CKD (chronic kidney disease), stage III ([place]) [N18.3]   . Hyperlipidemia [E78.5]   . Chronic systolic CHF (congestive heart failure) ([facility]) [I50.22]     Past Medical History Past Medical History:  Diagnosis Date  . Chronic combined systolic and diastolic CHF (congestive heart failure) ([place])   . CKD (chronic kidney disease), stage III ([place])   . Hyperlipidemia   . Hypertension   . Lower extremity edema   . Mild CAD    a. mild-mod by  cath 05/2018.  . Multiple myeloma (H[facility]) 11/15/2018  . NICM (nonischemic cardiomyopathy) (H[facility])   . Peripheral neuropathy   . Prostate cancer (H[facility])    Status post XRT  . Rheumatic fever   . Trifascicular block   . Type 2 diabetes mellitus (H[facility])     Past Surgical History Past Surgical History:  Procedure Laterality Date  . RIGHT HEART CATH N/A 07/10/2018   Procedure: RIGHT HEART CATH;  Surgeon: McLean, Dalton S, MD;  Location: [facility] INVASIVE CV LAB;  Service: Cardiovascular;  Laterality: N/A;  . RIGHT/LEFT HEART CATH AND CORONARY ANGIOGRAPHY N/A 06/18/2018   Procedure: RIGHT/LEFT HEART CATH AND CORONARY ANGIOGRAPHY;  Surgeon: Kelly, Thomas A, MD;  Location: [facility] INVASIVE CV LAB;  Service: Cardiovascular;  Laterality: N/A;    Family History Family History  Problem Relation Age of Onset  . Congestive Heart Failure Father        died from heart failure at the age of 56  . Cancer Mother   . Cancer Sister   . Congestive Heart Failure Brother        died from heart failure at the age of 40      Social History  reports that he has never smoked. He has never used smokeless tobacco. He reports that he does not drink alcohol or use drugs.  Medications  Current Outpatient Medications:  .  aspirin EC 81 MG tablet, Take 81 mg by mouth every evening. , Disp: , Rfl:  .  carvedilol (COREG) 6.25 MG tablet, Take 1.5 tablets (9.375 mg total) by mouth 2 (two) times daily., Disp: 270 tablet, Rfl: 1 .  Cholecalciferol (VITAMIN D3 PO), Take 1 tablet by mouth daily. , Disp: , Rfl:  .  Cyanocobalamin (B-12 PO), Take 1 tablet by mouth daily. , Disp: , Rfl:  .  gabapentin (NEURONTIN) 800 MG tablet, Take 800 mg by mouth 2 (two) times daily. , Disp: , Rfl:  .  glipiZIDE (GLUCOTROL) 10 MG tablet, Take 10 mg by mouth 2 (two) times daily. , Disp: , Rfl:  .  hydrALAZINE (APRESOLINE) 25 MG tablet, Take 1 tablet (25 mg total) by mouth 3 (three) times daily., Disp: 90 tablet, Rfl: 3 .  isosorbide mononitrate (IMDUR) 30 MG 24 hr tablet, Take 1 tablet (30 mg total) by mouth daily., Disp: 30 tablet, Rfl: 2 .  Multiple Vitamin (MULTIVITAMIN WITH MINERALS) TABS tablet, Take 1 tablet by mouth 2 (two) times daily., Disp: , Rfl:  .  oxybutynin (DITROPAN-XL) 5 MG 24 hr tablet, Take 5 mg by mouth daily. , Disp: , Rfl:  .  PRECISION XTRA TEST STRIPS test strip, , Disp: , Rfl:  .  spironolactone (ALDACTONE) 25 MG tablet, Take 1 tablet (25 mg total) by mouth at bedtime., Disp: 30 tablet, Rfl: 6 .  tamsulosin (FLOMAX) 0.4 MG CAPS capsule, Take 0.4 mg by mouth 2 (two) times daily. , Disp: , Rfl:  .  torsemide (DEMADEX) 20 MG tablet, Take 2 tablets (40 mg total) by mouth 2 (two) times daily., Disp: 60 tablet, Rfl: 2  Allergies Tramadol, Finasteride, Lisinopril, Ciprofloxacin, Simvastatin, and Sulfa antibiotics  Review of Systems Review of Systems - Oncology ROS negative   Physical Exam  Vitals Wt Readings from Last 3 Encounters:  11/15/18 213 lb 6.4 oz (96.8 kg)  10/24/18 213 lb (96.6 kg)  10/10/18 213 lb (96.6  kg)   Temp Readings from Last 3 Encounters:  11/15/18 98.7 F (37.1 C) (Oral)  11/04/18 97.9 F (36.6 C) (Oral)    10/18/18 98.5 F (36.9 C)   BP Readings from Last 3 Encounters:  11/15/18 118/62  11/04/18 134/84  10/10/18 (!) 110/59   Pulse Readings from Last 3 Encounters:  11/15/18 67  11/04/18 60  10/10/18 62   Constitutional: Well-developed, well-nourished, and in no distress.   HENT: Head: Normocephalic and atraumatic.  Mouth/Throat: No oropharyngeal exudate. Mucosa moist. Eyes: Pupils are equal, round, and reactive to light. Conjunctivae are normal. No scleral icterus.  Neck: Normal range of motion. Neck supple. No JVD present.  Cardiovascular: Normal rate, regular rhythm and normal heart sounds.  Exam reveals no gallop and no friction rub.   No murmur heard. Pulmonary/Chest: Effort normal and breath sounds normal. No respiratory distress. No wheezes.No rales.  Abdominal: Soft. Bowel sounds are normal. No distension. There is no tenderness. There is no guarding.  Musculoskeletal: No edema or tenderness.  Lymphadenopathy: No cervical, axillary or supraclavicular adenopathy.  Neurological: Alert and oriented to person, place, and time. No cranial nerve deficit.  Skin: Skin is warm and dry. No rash noted. No erythema. No pallor.  Psychiatric: Affect and judgment normal.   Labs No visits with results within 3 Day(s) from this visit.  Latest known visit with results is:  Hospital Outpatient Visit on 11/04/2018  Component Date Value Ref Range Status  . Glucose-Capillary 11/04/2018 104* 70 - 99 mg/dL Final  . Comment 1 11/04/2018 Notify RN   Final     Pathology Orders Placed This Encounter  Procedures  . CBC with Differential (Cancer Center Only)    Standing Status:   Future    Standing Expiration Date:   11/15/2019  . CMP ([place] only)    Standing Status:   Future    Standing Expiration Date:   11/15/2019  . Lactate dehydrogenase (LDH)    Standing Status:    Future    Standing Expiration Date:   11/15/2019  . SPEP with reflex to IFE    Standing Status:   Future    Standing Expiration Date:   11/15/2019  . Kappa/lambda light chains    Standing Status:   Future    Standing Expiration Date:   11/15/2019  . QIG  (Quant. immunoglobulins  - IgG, IgA, IgM)    Standing Status:   Future    Standing Expiration Date:   11/15/2019       Zoila Shutter MD

## 2018-11-18 ENCOUNTER — Telehealth: Payer: Self-pay | Admitting: Internal Medicine

## 2018-11-18 ENCOUNTER — Telehealth: Payer: Self-pay | Admitting: Hematology and Oncology

## 2018-11-18 NOTE — Telephone Encounter (Signed)
Called and spoke with patient. Confirmed date and time  °

## 2018-11-18 NOTE — Telephone Encounter (Signed)
Former patient of Dr. Walden Field, needs appointment in office this week. Patient requests male provider. Confirmed 7/31 appointment with Dr. Alvy Bimler at 12 pm.

## 2018-11-19 NOTE — Telephone Encounter (Signed)
TCT pt's daughter re: referral to [facility] regarding new Multiple Myeloma diagnosis.  No answer but was able to leave vm message for daughter informing her of referral to [facility].

## 2018-11-20 NOTE — Telephone Encounter (Signed)
Oral Oncology Pharmacist Encounter  Patient is transferring care to Dr. Alvy Bimler. Revlimid referral will be cancelled until further directed by Dr. Alvy Bimler about treatment plan.  Johny Drilling, PharmD, BCPS, BCOP  11/20/2018 8:07 AM Oral Oncology Clinic [phone]

## 2018-11-21 ENCOUNTER — Other Ambulatory Visit: Payer: Self-pay

## 2018-11-21 ENCOUNTER — Encounter (HOSPITAL_COMMUNITY): Payer: Self-pay | Admitting: Cardiology

## 2018-11-21 ENCOUNTER — Ambulatory Visit (HOSPITAL_COMMUNITY)
Admission: RE | Admit: 2018-11-21 | Discharge: 2018-11-21 | Disposition: A | Discharge status: Home / Self Care | Payer: Medicare Other | Source: Ambulatory Visit | Attending: Cardiology | Admitting: Cardiology

## 2018-11-21 ENCOUNTER — Ambulatory Visit (HOSPITAL_BASED_OUTPATIENT_CLINIC_OR_DEPARTMENT_OTHER)
Admission: RE | Admit: 2018-11-21 | Discharge: 2018-11-21 | Disposition: A | Discharge status: Home / Self Care | Payer: Medicare Other | Source: Ambulatory Visit | Attending: Cardiology | Admitting: Cardiology

## 2018-11-21 VITALS — BP 140/82 | HR 56 | Wt 211.6 lb

## 2018-11-21 DIAGNOSIS — I351 Nonrheumatic aortic (valve) insufficiency: Secondary | ICD-10-CM | POA: Diagnosis not present

## 2018-11-21 DIAGNOSIS — G4733 Obstructive sleep apnea (adult) (pediatric): Secondary | ICD-10-CM | POA: Diagnosis not present

## 2018-11-21 DIAGNOSIS — I272 Pulmonary hypertension, unspecified: Secondary | ICD-10-CM | POA: Insufficient documentation

## 2018-11-21 DIAGNOSIS — Z79899 Other long term (current) drug therapy: Secondary | ICD-10-CM | POA: Diagnosis not present

## 2018-11-21 DIAGNOSIS — C9000 Multiple myeloma not having achieved remission: Secondary | ICD-10-CM | POA: Insufficient documentation

## 2018-11-21 DIAGNOSIS — Z7984 Long term (current) use of oral hypoglycemic drugs: Secondary | ICD-10-CM | POA: Diagnosis not present

## 2018-11-21 DIAGNOSIS — E1122 Type 2 diabetes mellitus with diabetic chronic kidney disease: Secondary | ICD-10-CM | POA: Insufficient documentation

## 2018-11-21 DIAGNOSIS — Z8249 Family history of ischemic heart disease and other diseases of the circulatory system: Secondary | ICD-10-CM | POA: Insufficient documentation

## 2018-11-21 DIAGNOSIS — N183 Chronic kidney disease, stage 3 unspecified: Secondary | ICD-10-CM

## 2018-11-21 DIAGNOSIS — I2510 Atherosclerotic heart disease of native coronary artery without angina pectoris: Secondary | ICD-10-CM | POA: Diagnosis not present

## 2018-11-21 DIAGNOSIS — I5022 Chronic systolic (congestive) heart failure: Secondary | ICD-10-CM

## 2018-11-21 DIAGNOSIS — I130 Hypertensive heart and chronic kidney disease with heart failure and stage 1 through stage 4 chronic kidney disease, or unspecified chronic kidney disease: Secondary | ICD-10-CM | POA: Diagnosis not present

## 2018-11-21 DIAGNOSIS — E785 Hyperlipidemia, unspecified: Secondary | ICD-10-CM | POA: Diagnosis not present

## 2018-11-21 DIAGNOSIS — I4510 Unspecified right bundle-branch block: Secondary | ICD-10-CM | POA: Diagnosis not present

## 2018-11-21 DIAGNOSIS — H6121 Impacted cerumen, right ear: Secondary | ICD-10-CM | POA: Diagnosis not present

## 2018-11-21 DIAGNOSIS — I428 Other cardiomyopathies: Secondary | ICD-10-CM | POA: Insufficient documentation

## 2018-11-21 DIAGNOSIS — Z7982 Long term (current) use of aspirin: Secondary | ICD-10-CM | POA: Insufficient documentation

## 2018-11-21 LAB — BASIC METABOLIC PANEL
Anion gap: 9 (ref 5–15)
BUN: 29 mg/dL — ABNORMAL HIGH (ref 8–23)
CO2: 28 mmol/L (ref 22–32)
Calcium: 9.2 mg/dL (ref 8.9–10.3)
Chloride: 103 mmol/L (ref 98–111)
Creatinine, Ser: 1.97 mg/dL — ABNORMAL HIGH (ref 0.61–1.24)
GFR calc Af Amer: 36 mL/min — ABNORMAL LOW (ref >60)
GFR calc non Af Amer: 31 mL/min — ABNORMAL LOW (ref >60)
Glucose, Bld: 69 mg/dL — ABNORMAL LOW (ref 70–99)
Potassium: 3.5 mmol/L (ref 3.5–5.1)
Sodium: 140 mmol/L (ref 135–145)

## 2018-11-21 MED ORDER — ISOSORBIDE MONONITRATE ER 60 MG PO TB24: 60.0000 mg | ORAL_TABLET | Freq: Every day | ORAL | 3 refills | Status: DC

## 2018-11-21 MED ORDER — HYDRALAZINE HCL 25 MG PO TABS: 37.5000 mg | ORAL_TABLET | Freq: Three times a day (TID) | ORAL | 3 refills | Status: DC

## 2018-11-21 NOTE — Patient Instructions (Signed)
Labs were done today. We will call you with any ABNORMAL results. No news is good news!  EKG was completed.   INCREASE Hydralazine to 37.5 mg (1.5 tabs) three times a day.  INCREASE Imdur to 60 mg (1 tab) daily.  Your physician recommends that you schedule a follow-up appointment in: 3 months.  At the [facility], you and your health needs are our priority. As part of our continuing mission to provide you with exceptional heart care, we have created designated Provider Care Teams. These Care Teams include your primary Cardiologist (physician) and Advanced Practice Providers (APPs- Physician Assistants and Nurse Practitioners) who all work together to provide you with the care you need, when you need it.   You may see any of the following providers on your designated Care Team at your next follow up: Marland Kitchen Dr Glori Bickers . Dr Loralie Champagne . Darrick Grinder, NP   Please be sure to bring in all your medications bottles to every appointment.

## 2018-11-21 NOTE — Progress Notes (Signed)
  Echocardiogram 2D Echocardiogram has been performed.  Pastor Sgro L Androw 11/21/2018, 11:45 AM

## 2018-11-21 NOTE — Progress Notes (Signed)
Date:  11/21/2018   ID:  Allen Dyer, DOB 27-Dec-1935, MRN [id number]    Provider location: [place] [facility] Failure Type of Visit: Established patient   PCP:  [name], MD  Cardiologist:  [name], MD HF Cardiology: Dr Aundra Dubin   History of Present Illness: Allen Dyer is a 83 y.o. male with a history of DM, rheumatic fever, HTN, hyperlipidemia, CKD 3, prostate CA s/p XRT, systolic HF due to NICM (EF 30-35%) 05/2018,pulmonary venous HTN, and mild nonobstructive CAD.  Admitted 3/11-3/18/20 with volume overload. Diuresed with IV lasix. HF team consulted with concern for pulmonary HTN due to ongoing oxygen need. RHC completed and showed moderate pulmonary venousHTN with normal PVR 1.7 (no PAH). Transitioned from Lasix to torsemide 40 mg BID at DC. Oxygen weaned off. HF medications optimized as able. Limited by CKD. DC weight 213 lbs.   Patient was diagnosed with multiple myeloma in 7/20, plans to start treatment soon and will also be evaluated at [facility] for bone marrow transplant.   He returns for followup of CHF.  He is now using CPAP and feels like this is helping a lot with his fatigue and daytime sleepiness.  BP is mildly elevated.  No lightheadedness or palpitations.  Generally feeling well.  No dyspnea walking on flat ground or up a flight of stairs.  He can walk about 1.5 miles without stopping.  No orthopnea/PND.  No chest pain.   ECG (personally reviewed): NSR, RBBB, LAFB, 1st degree AVB  Labs (7/20): creatinine 2.29   PMH: 1. CKD: Stage 3.  2. Hyperlipidemia: Statin intolerant.  3. HTN 4. Multiple myeloma: Diagnosed in 7/20.  5. Prostate cancer s/p XRT.  6. Type 2 diabetes.  7. Chronic systolic CHF: Nonischemic cardiomyopathy.  - RHC/LHC (2/20): Nonobstructive CAD with 50% pLAD; mean RA 13, PA 72/27 mean 47, mean PCWP 29, CI 2.8, PVR 3 WU - Echo (2/20): EF 30-35%.  - RHC (3/20): mean RA 9, PA 55/20 mean 32, mean PCWP 20, CI 3.29, PVR 1.7 WU - Echo  (7/20): EF 25-30%, moderate LV dilation without LVH, normal RV size and systolic function, mild-moderate AI.  8. Pulmonary venous hypertension.  - V/Q scan (3/20): No evidence for chronic PE.  9. OSA: Using CPAP.   Past Surgical History:  Procedure Laterality Date  . RIGHT HEART CATH N/A 07/10/2018   Procedure: RIGHT HEART CATH;  Surgeon: Larey Dresser, MD;  Location: [facility] CV LAB;  Service: Cardiovascular;  Laterality: N/A;  . RIGHT/LEFT HEART CATH AND CORONARY ANGIOGRAPHY N/A 06/18/2018   Procedure: RIGHT/LEFT HEART CATH AND CORONARY ANGIOGRAPHY;  Surgeon: Troy Sine, MD;  Location: [place] CV LAB;  Service: Cardiovascular;  Laterality: N/A;     Current Outpatient Medications  Medication Sig Dispense Refill  . aspirin EC 81 MG tablet Take 81 mg by mouth every evening.     . carvedilol (COREG) 6.25 MG tablet Take 1.5 tablets (9.375 mg total) by mouth 2 (two) times daily. 270 tablet 1  . Cholecalciferol (VITAMIN D3 PO) Take 1 tablet by mouth daily.     . Cyanocobalamin (B-12 PO) Take 1 tablet by mouth daily.     Marland Kitchen gabapentin (NEURONTIN) 800 MG tablet Take 800 mg by mouth 2 (two) times daily.     Marland Kitchen glipiZIDE (GLUCOTROL) 10 MG tablet Take 10 mg by mouth 2 (two) times daily.     . hydrALAZINE (APRESOLINE) 25 MG tablet Take 1.5 tablets (37.5 mg total) by mouth  3 (three) times daily. 405 tablet 3  . isosorbide mononitrate (IMDUR) 60 MG 24 hr tablet Take 1 tablet (60 mg total) by mouth daily. 90 tablet 3  . Multiple Vitamin (MULTIVITAMIN WITH MINERALS) TABS tablet Take 1 tablet by mouth 2 (two) times daily.    Marland Kitchen oxybutynin (DITROPAN-XL) 5 MG 24 hr tablet Take 5 mg by mouth daily.     Marland Kitchen PRECISION XTRA TEST STRIPS test strip     . spironolactone (ALDACTONE) 25 MG tablet Take 1 tablet (25 mg total) by mouth at bedtime. 30 tablet 6  . tamsulosin (FLOMAX) 0.4 MG CAPS capsule Take 0.4 mg by mouth 2 (two) times daily.     Marland Kitchen torsemide (DEMADEX) 20 MG tablet Take 2 tablets (40 mg total)  by mouth 2 (two) times daily. 60 tablet 2   No current facility-administered medications for this encounter.     Allergies:   Tramadol, Finasteride, Lisinopril, Ciprofloxacin, Simvastatin, and Sulfa antibiotics   Social History:  The patient  reports that he has never smoked. He has never used smokeless tobacco. He reports that he does not drink alcohol or use drugs.   Family History:  The patient's family history includes Cancer in his mother and sister; Congestive Heart Failure in his brother and father.   ROS:  Please see the history of present illness.   All other systems are personally reviewed and negative.  Vitals:   11/21/18 1152  BP: 140/82  Pulse: (!) 56  SpO2: 97%    Exam:   BP 140/82   Pulse (!) 56   Wt 96 kg (211 lb 9.6 oz)   SpO2 97%   BMI 33.14 kg/m  General: NAD Neck: No JVD, no thyromegaly or thyroid nodule.  Lungs: Clear to auscultation bilaterally with normal respiratory effort. CV: Nondisplaced PMI.  Heart regular S1/S2, no S3/S4, no murmur.  No peripheral edema.  No carotid bruit.  Normal pedal pulses.  Abdomen: Soft, nontender, no hepatosplenomegaly, no distention.  Skin: Intact without lesions or rashes.  Neurologic: Alert and oriented x 3.  Psych: Normal affect. Extremities: No clubbing or cyanosis.  HEENT: Normal.   Recent Labs: 07/01/2018: NT-Pro BNP 5,013 07/03/2018: B Natriuretic Peptide 1,606.3; Magnesium 2.2 10/10/2018: ALT 16 11/04/2018: Hemoglobin 11.6; Platelets 157 11/21/2018: BUN 29; Creatinine, Ser 1.97; Potassium 3.5; Sodium 140  Personally reviewed   Wt Readings from Last 3 Encounters:  11/21/18 96 kg (211 lb 9.6 oz)  11/15/18 96.8 kg (213 lb 6.4 oz)  10/24/18 96.6 kg (213 lb)      ASSESSMENT AND PLAN:  1. Chronic systolic HF:  New diagnosis 05/2018, EF 30-35% by echo.Due to NICM. LHC 05/2018 with mild non-osbtructive CAD (50% proximal LAD stenosis).Has had high BP for a long time, but it has generally been controlled recently. Had  palpitations in past, but very few PVCs on telemetry when in the hospital. Prior hx of ETOH abuse, but none in 40+ years. Did not receive chemotherapy with prior prostate cancer. QRS is wide on EKG 166 ms, but it is RBBB. He has multiple myeloma but he does not have ventricular wall thickening that would be suggestive of cardiac amyloidosis.  [place] 3/20 with mildly elevated filling pressures, CO preserved, pulmonary venous HTN. No PAH. Echo was done today and reviewed.  EF remains low at 25-30% with moderate LV dilation, no LVH, and normal RV.  NYHA class II symptoms.  He is not volume overloaded on exam.  - Continuetorsemide 40 mg BID. BMET today.  -  No ACE/ARB/ARNI with CKD stage 3.  - Increase hydralazine to 37.5 mg tid and Imdur to 60 mg daily.   - Continue coreg to 9.375 mg BID, HR in 50s. -Continuespironolactone 25 mg qhs.BMET today. - With advanced age and nonischemic etiology of cardiomyopathy, would avoid ICD.  He has RBBB so would not likely benefit from CRT.  2. CKD Stage 3: Creatinine 2-2.5 recently.  - He has been referred to nephrology.  3. CAD: 50% pLAD on LHC in 2/20. No chest pain.  - He has not been able to tolerate statins but LDL has been low.  -ContinueASA 81 mg daily.  4. Severe OSA: Continue CPAP.  5. Pulmonary hypertension: He had pulmonary venous hypertension based on RHC.  6. Multiple myeloma: To start treatment soon.   Followup in 3 months.      Signed, Loralie Champagne, MD  11/21/2018  [place] [address] [facility] [place] [address] [phone] (office) [phone] (fax)

## 2018-11-22 ENCOUNTER — Other Ambulatory Visit: Payer: Self-pay

## 2018-11-22 ENCOUNTER — Inpatient Hospital Stay (HOSPITAL_BASED_OUTPATIENT_CLINIC_OR_DEPARTMENT_OTHER): Payer: Medicare Other | Admitting: Hematology and Oncology

## 2018-11-22 ENCOUNTER — Encounter: Payer: Self-pay | Admitting: Hematology and Oncology

## 2018-11-22 DIAGNOSIS — N183 Chronic kidney disease, stage 3 unspecified: Secondary | ICD-10-CM

## 2018-11-22 DIAGNOSIS — E1159 Type 2 diabetes mellitus with other circulatory complications: Secondary | ICD-10-CM

## 2018-11-22 DIAGNOSIS — E1122 Type 2 diabetes mellitus with diabetic chronic kidney disease: Secondary | ICD-10-CM | POA: Diagnosis not present

## 2018-11-22 DIAGNOSIS — E1121 Type 2 diabetes mellitus with diabetic nephropathy: Secondary | ICD-10-CM

## 2018-11-22 DIAGNOSIS — I5041 Acute combined systolic (congestive) and diastolic (congestive) heart failure: Secondary | ICD-10-CM

## 2018-11-22 DIAGNOSIS — C9000 Multiple myeloma not having achieved remission: Secondary | ICD-10-CM

## 2018-11-22 DIAGNOSIS — I5043 Acute on chronic combined systolic (congestive) and diastolic (congestive) heart failure: Secondary | ICD-10-CM

## 2018-11-22 DIAGNOSIS — E1142 Type 2 diabetes mellitus with diabetic polyneuropathy: Secondary | ICD-10-CM

## 2018-11-22 DIAGNOSIS — Z8546 Personal history of malignant neoplasm of prostate: Secondary | ICD-10-CM

## 2018-11-22 DIAGNOSIS — D649 Anemia, unspecified: Secondary | ICD-10-CM | POA: Diagnosis not present

## 2018-11-22 DIAGNOSIS — C61 Malignant neoplasm of prostate: Secondary | ICD-10-CM

## 2018-11-22 DIAGNOSIS — I130 Hypertensive heart and chronic kidney disease with heart failure and stage 1 through stage 4 chronic kidney disease, or unspecified chronic kidney disease: Secondary | ICD-10-CM | POA: Diagnosis not present

## 2018-11-22 NOTE — Assessment & Plan Note (Signed)
He is currently on aggressive medical management under the guidance of his cardiologist I will discuss plan of care further with his cardiologist before embarking on a plan to start him on chemotherapy due to his current cardiac condition and kidney failure

## 2018-11-22 NOTE — Progress Notes (Signed)
[facility] FOLLOW-UP progress notes  Patient Care Team: [name], MD as PCP - General (Family Medicine) [name], MD as PCP - Cardiology (Cardiology)  CHIEF COMPLAINTS/PURPOSE OF VISIT:  Possible diagnosis of multiple myeloma, for further follow-up  HISTORY OF PRESENTING ILLNESS:  Allen Dyer 83 y.o. male was transferred to my care after his prior physician has left.  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, was recently told he has diagnosis of multiple myeloma The patient has longstanding history of diabetes for over 30 years, complicated by nephropathy, neuropathy as well as vascular disease He had reduced sensation in his hands and feet up to his ankles from peripheral neuropathy He is currently not on insulin therapy Recently, he was noted to have worsening renal failure An outside facility has performed serum protein electrophoresis that detected 0.4 M spike protein Due to concern for multiple myeloma, he was referred here for further evaluation Since then, he had skeletal survey, repeat serum protein electrophoresis and urine studies along with bone marrow biopsy He is referred to see me after his physician has left. Clinically, he is not symptomatic Denies bone pain His neuropathy is stable with gabapentin He had recent diagnosis of congestive heart failure and is on aggressive medical management Today, he denies cough, chest pain or shortness of breath Denies recent infection, fever or chills  I reviewed the patient's records extensive and collaborated the history with the patient. Summary of his history is as follows: Oncology History  Multiple myeloma ([place])  10/17/2018 Imaging   1. Interim clearing of congestive heart failure. Persistent cardiomegaly.   2.  No evidence of metastatic disease or myeloma   11/04/2018 Bone Marrow Biopsy   Bone Marrow, Aspirate,Biopsy, and Clot, right ilium - PLASMA CELL NEOPLASM, SEE  COMMENT. - ATYPICAL MEGAKARYOCYTES. PERIPHERAL BLOOD: - NORMOCYTIC ANEMIA. Diagnosis Note The marrow is mildly hypercellular for age with increased plasma cells (15% by CD138 immunohistochemistry). These findings are consistent with a plasma cell neoplasm. Correlation with clinical, radiographic, and laboratory data is required for further classification. There are a few atypical megakaryocytes the significance of which is unclear.  Del 13q noted but normal cytogenetics     MEDICAL HISTORY:  Past Medical History:  Diagnosis Date  . Chronic combined systolic and diastolic CHF (congestive heart failure) ([place])   . CKD (chronic kidney disease), stage III ([place])   . Hyperlipidemia   . Hypertension   . Lower extremity edema   . Mild CAD    a. mild-mod by cath 05/2018.  . Multiple myeloma ([place]) 11/15/2018  . NICM (nonischemic cardiomyopathy) ([place])   . Peripheral neuropathy   . Prostate cancer ([place])    Status post XRT  . Rheumatic fever   . Trifascicular block   . Type 2 diabetes mellitus ([place])     SURGICAL HISTORY: Past Surgical History:  Procedure Laterality Date  . RIGHT HEART CATH N/A 07/10/2018   Procedure: RIGHT HEART CATH;  Surgeon: Larey Dresser, MD;  Location: [facility];  Service: Cardiovascular;  Laterality: N/A;  . RIGHT/LEFT HEART CATH AND CORONARY ANGIOGRAPHY N/A 06/18/2018   Procedure: RIGHT/LEFT HEART CATH AND CORONARY ANGIOGRAPHY;  Surgeon: Troy Sine, MD;  Location: [facility];  Service: Cardiovascular;  Laterality: N/A;    SOCIAL HISTORY: Social History   Socioeconomic History  . Marital status: Widowed    Spouse name: Not on file  . Number of children: 4  . Years of education: Not  on file  . Highest education level: Not on file  Occupational History  . Occupation: retired from Management consultant  Social Needs  . Financial resource strain: Not on file  . Food insecurity    Worry: Not on file    Inability: Not on file  .  Transportation needs    Medical: Not on file    Non-medical: Not on file  Tobacco Use  . Smoking status: Former Research scientist (life sciences)  . Smokeless tobacco: Never Used  Substance and Sexual Activity  . Alcohol use: Never    Frequency: Never  . Drug use: Never  . Sexual activity: Not on file  Lifestyle  . Physical activity    Days per week: Not on file    Minutes per session: Not on file  . Stress: Not on file  Relationships  . Social Herbalist on phone: Not on file    Gets together: Not on file    Attends religious service: Not on file    Active member of club or organization: Not on file    Attends meetings of clubs or organizations: Not on file    Relationship status: Not on file  . Intimate partner violence    Fear of current or ex partner: Not on file    Emotionally abused: Not on file    Physically abused: Not on file    Forced sexual activity: Not on file  Other Topics Concern  . Not on file  Social History Narrative  . Not on file    FAMILY HISTORY: Family History  Problem Relation Age of Onset  . Congestive Heart Failure Father        died from heart failure at the age of 22  . Cancer Mother   . Cancer Sister   . Congestive Heart Failure Brother        died from heart failure at the age of 42    ALLERGIES:  is allergic to tramadol; finasteride; lisinopril; ciprofloxacin; simvastatin; and sulfa antibiotics.  MEDICATIONS:  Current Outpatient Medications  Medication Sig Dispense Refill  . aspirin EC 81 MG tablet Take 81 mg by mouth every evening.     . carvedilol (COREG) 6.25 MG tablet Take 1.5 tablets (9.375 mg total) by mouth 2 (two) times daily. 270 tablet 1  . Cholecalciferol (VITAMIN D3 PO) Take 1 tablet by mouth daily.     . Cyanocobalamin (B-12 PO) Take 1 tablet by mouth daily.     Marland Kitchen gabapentin (NEURONTIN) 800 MG tablet Take 800 mg by mouth 2 (two) times daily.     Marland Kitchen glipiZIDE (GLUCOTROL) 10 MG tablet Take 10 mg by mouth 2 (two) times daily.     .  hydrALAZINE (APRESOLINE) 25 MG tablet Take 1.5 tablets (37.5 mg total) by mouth 3 (three) times daily. 405 tablet 3  . isosorbide mononitrate (IMDUR) 60 MG 24 hr tablet Take 1 tablet (60 mg total) by mouth daily. 90 tablet 3  . Multiple Vitamin (MULTIVITAMIN WITH MINERALS) TABS tablet Take 1 tablet by mouth 2 (two) times daily.    Marland Kitchen oxybutynin (DITROPAN-XL) 5 MG 24 hr tablet Take 5 mg by mouth daily.     Marland Kitchen PRECISION XTRA TEST STRIPS test strip     . spironolactone (ALDACTONE) 25 MG tablet Take 1 tablet (25 mg total) by mouth at bedtime. 30 tablet 6  . tamsulosin (FLOMAX) 0.4 MG CAPS capsule Take 0.4 mg by mouth 2 (two) times daily.     Marland Kitchen  torsemide (DEMADEX) 20 MG tablet Take 2 tablets (40 mg total) by mouth 2 (two) times daily. 60 tablet 2   No current facility-administered medications for this visit.     REVIEW OF SYSTEMS:   Constitutional: Denies fevers, chills or abnormal night sweats Eyes: Denies blurriness of vision, double vision or watery eyes Ears, nose, mouth, throat, and face: Denies mucositis or sore throat Respiratory: Denies cough, dyspnea or wheezes Cardiovascular: Denies palpitation, chest discomfort  Gastrointestinal:  Denies nausea, heartburn or change in bowel habits Skin: Denies abnormal skin rashes Lymphatics: Denies new lymphadenopathy or easy bruising Neurological:Denies numbness, tingling or new weaknesses Behavioral/Psych: Mood is stable, no new changes  All other systems were reviewed with the patient and are negative.  PHYSICAL EXAMINATION: ECOG PERFORMANCE STATUS: 1 - Symptomatic but completely ambulatory  Vitals:   11/22/18 1208  BP: (!) 103/52  Pulse: (!) 56  Resp: 18  Temp: 98.7 F (37.1 C)  SpO2: 95%   Filed Weights   11/22/18 1208  Weight: 213 lb 6.4 oz (96.8 kg)    GENERAL:alert, no distress and comfortable SKIN: skin color, texture, turgor are normal, no rashes or significant lesions EYES: normal, conjunctiva are pink and non-injected,  sclera clear OROPHARYNX:no exudate, normal lips, buccal mucosa, and tongue  NECK: supple, thyroid normal size, non-tender, without nodularity LYMPH:  no palpable lymphadenopathy in the cervical, axillary or inguinal LUNGS: clear to auscultation and percussion with normal breathing effort HEART: regular rate & rhythm and no murmurs with mild bilateral lower extremity edema ABDOMEN:abdomen soft, non-tender and normal bowel sounds Musculoskeletal:no cyanosis of digits and no clubbing  PSYCH: alert & oriented x 3 with fluent speech NEURO: no focal motor/sensory deficits  LABORATORY DATA:  I have reviewed the data as listed Lab Results  Component Value Date   WBC 5.1 11/04/2018   HGB 11.6 (L) 11/04/2018   HCT 35.4 (L) 11/04/2018   MCV 98.3 11/04/2018   PLT 157 11/04/2018   Recent Labs    07/03/18 1120  10/10/18 1404 11/04/18 1009 11/21/18 1308  NA 143   < > 141 137 140  K 3.5   < > 3.9 5.5* 3.5  CL 104   < > 101 99 103  CO2 31   < > 32 29 28  GLUCOSE 80   < > 120* 94 69*  BUN 27*   < > 31* 42* 29*  CREATININE 2.16*   < > 2.06* 2.29* 1.97*  CALCIUM 9.3   < > 9.6 9.1 9.2  GFRNONAA 28*   < > 29* 26* 31*  GFRAA 32*   < > 34* 30* 36*  PROT 6.9  --  7.3  --   --   ALBUMIN 3.2*  --  3.4*  --   --   AST 40  --  20  --   --   ALT 59*  --  16  --   --   ALKPHOS 74  --  84  --   --   BILITOT 0.7  --  0.4  --   --    < > = values in this interval not displayed.    RADIOGRAPHIC STUDIES: I have personally reviewed the radiological images as listed and agreed with the findings in the report. Ct Biopsy  Result Date: 11/04/2018 INDICATION: 83 year old with elevated light chains and monoclonal gammopathy. EXAM: CT GUIDED BONE MARROW ASPIRATES AND BIOPSY Physician: Stephan Minister. Anselm Pancoast, MD MEDICATIONS: None. ANESTHESIA/SEDATION: Fentanyl 75 mcg IV; Versed 2.0 mg  IV Moderate Sedation Time:  15 minutes The patient was continuously monitored during the procedure by the interventional radiology nurse  under my direct supervision. COMPLICATIONS: None immediate. PROCEDURE: The procedure was explained to the patient. The risks and benefits of the procedure were discussed and the patient's questions were addressed. Informed consent was obtained from the patient. The patient was placed prone on CT table. Images of the pelvis were obtained. The right side of back was prepped and draped in sterile fashion. The skin and right posterior ilium were anesthetized with 1% lidocaine. 11 gauge bone needle was directed into the right ilium with CT guidance. Two aspirates and one core biopsy were obtained. Bandage placed over the puncture site. IMPRESSION: CT guided bone marrow aspiration and core biopsy. Electronically Signed   By: Markus Daft M.D.   On: 11/04/2018 17:57   Ct Bone Marrow Biopsy & Aspiration  Result Date: 11/04/2018 INDICATION: 83 year old with elevated light chains and monoclonal gammopathy. EXAM: CT GUIDED BONE MARROW ASPIRATES AND BIOPSY Physician: Stephan Minister. Anselm Pancoast, MD MEDICATIONS: None. ANESTHESIA/SEDATION: Fentanyl 75 mcg IV; Versed 2.0 mg IV Moderate Sedation Time:  15 minutes The patient was continuously monitored during the procedure by the interventional radiology nurse under my direct supervision. COMPLICATIONS: None immediate. PROCEDURE: The procedure was explained to the patient. The risks and benefits of the procedure were discussed and the patient's questions were addressed. Informed consent was obtained from the patient. The patient was placed prone on CT table. Images of the pelvis were obtained. The right side of back was prepped and draped in sterile fashion. The skin and right posterior ilium were anesthetized with 1% lidocaine. 11 gauge bone needle was directed into the right ilium with CT guidance. Two aspirates and one core biopsy were obtained. Bandage placed over the puncture site. IMPRESSION: CT guided bone marrow aspiration and core biopsy. Electronically Signed   By: Markus Daft M.D.    On: 11/04/2018 17:57    ASSESSMENT & PLAN:  Multiple myeloma ([place]) I have reviewed his case extensively. I am not convinced that the patient has diagnosis of multiple myeloma.  He could have smoldering myeloma given increased plasma cells and his renal failure is unlikely to be related to the plasma cell disorder. The patient was recently diagnosed with congestive heart failure and has longstanding history of diabetes that both can contribute to elevated creatinine I am planning to get his case presented at the next hematology tumor board for further discussion For now, the patient has no symptoms I will call him next week once we have the plan of care in place to determine the next step.   Acute CHF (congestive heart failure) ([place]) He is currently on aggressive medical management under the guidance of his cardiologist I will discuss plan of care further with his cardiologist before embarking on a plan to start him on chemotherapy due to his current cardiac condition and kidney failure  CKD (chronic kidney disease), stage III ([place]) He had recent acute on chronic renal failure Recent repeat blood work shows stable creatinine I do believe that his renal failure is unrelated to the plasma cell disorder He will continue medical management for now  Type 2 diabetes mellitus with vascular disease ([place]) The patient has multiple complication from diabetes with peripheral neuropathy, nephropathy and cardiovascular disease We discussed the importance of aggressive dietary modification and medical management.  Prostate cancer [facility]) He had history of prostate cancer status post radiation treatment He will continue close follow-up with  cardiologist He is currently not symptomatic  Diabetic peripheral neuropathy associated with type 2 diabetes mellitus ([place]) He has pre-existing peripheral neuropathy secondary to diabetes We will factor this condition in decision making for future chemotherapy  plan   No orders of the defined types were placed in this encounter.   All questions were answered. The patient knows to call the clinic with any problems, questions or concerns. I spent 60 minutes counseling the patient face to face. The total time spent in the appointment was 60 minutes and more than 50% was on counseling.     Heath Lark, MD 11/22/2018 3:21 PM

## 2018-11-22 NOTE — Assessment & Plan Note (Signed)
He had history of prostate cancer status post radiation treatment He will continue close follow-up with cardiologist He is currently not symptomatic

## 2018-11-22 NOTE — Assessment & Plan Note (Signed)
He has pre-existing peripheral neuropathy secondary to diabetes We will factor this condition in decision making for future chemotherapy plan

## 2018-11-22 NOTE — Assessment & Plan Note (Signed)
He had recent acute on chronic renal failure Recent repeat blood work shows stable creatinine I do believe that his renal failure is unrelated to the plasma cell disorder He will continue medical management for now

## 2018-11-22 NOTE — Assessment & Plan Note (Signed)
The patient has multiple complication from diabetes with peripheral neuropathy, nephropathy and cardiovascular disease We discussed the importance of aggressive dietary modification and medical management.

## 2018-11-22 NOTE — Assessment & Plan Note (Signed)
I have reviewed his case extensively. I am not convinced that the patient has diagnosis of multiple myeloma.  He could have smoldering myeloma given increased plasma cells and his renal failure is unlikely to be related to the plasma cell disorder. The patient was recently diagnosed with congestive heart failure and has longstanding history of diabetes that both can contribute to elevated creatinine I am planning to get his case presented at the next hematology tumor board for further discussion For now, the patient has no symptoms I will call him next week once we have the plan of care in place to determine the next step.

## 2018-11-26 ENCOUNTER — Telehealth: Payer: Self-pay | Admitting: Hematology and Oncology

## 2018-11-26 NOTE — Telephone Encounter (Signed)
Her spoke with his daughter Verdene Lennert to discuss final recommendation from hematology tumor board Overall, I believe the patient does not have multiple myeloma.  His chronic kidney disease and heart failure and related Just to be 100% sure, I will order Congo Red test on bone marrow to exclude amyloidosis If all tests are negative, we will observe him every 3 months I have addressed all her questions and concerns

## 2018-11-26 NOTE — Telephone Encounter (Signed)
I attempted to call the patient and his daughter twice today and not able to speak to the patient or his daughter I have left a voicemail to discuss outcome of tumor board discussion from this morning I will try to call again in a few days

## 2018-12-04 ENCOUNTER — Telehealth: Payer: Self-pay

## 2018-12-04 ENCOUNTER — Telehealth: Payer: Self-pay | Admitting: Hematology and Oncology

## 2018-12-04 NOTE — Telephone Encounter (Signed)
-----  Message from Heath Lark, MD sent at 12/04/2018  8:45 AM EDT ----- Regarding: final bone marrow biopsy negative for amyloid Pls call her daughter and let her know all the final tests I ordered are all clear I will see him in 3 months with labs to be done 1 week prior Scheduler will call

## 2018-12-04 NOTE — Telephone Encounter (Signed)
Called pt's dtr, Veronica, and left detailed vm with below information.  Advised her to call with any questions.

## 2018-12-04 NOTE — Telephone Encounter (Signed)
No answer and - no voicemail . Called per 8/12 sch message- sent reminder letter in the mail of appts scheduled

## 2018-12-09 NOTE — Telephone Encounter (Signed)
Appointment changed to 9/29 at 8:40 due to provider schedule change. I will notify the patient.

## 2018-12-10 DIAGNOSIS — R3912 Poor urinary stream: Secondary | ICD-10-CM | POA: Diagnosis not present

## 2018-12-10 DIAGNOSIS — R8279 Other abnormal findings on microbiological examination of urine: Secondary | ICD-10-CM | POA: Diagnosis not present

## 2018-12-10 DIAGNOSIS — R3914 Feeling of incomplete bladder emptying: Secondary | ICD-10-CM | POA: Diagnosis not present

## 2018-12-11 DIAGNOSIS — H60392 Other infective otitis externa, left ear: Secondary | ICD-10-CM | POA: Diagnosis not present

## 2018-12-11 DIAGNOSIS — H938X1 Other specified disorders of right ear: Secondary | ICD-10-CM | POA: Diagnosis not present

## 2018-12-18 ENCOUNTER — Other Ambulatory Visit: Payer: Medicare Other

## 2018-12-18 ENCOUNTER — Ambulatory Visit: Payer: Medicare Other | Admitting: Internal Medicine

## 2018-12-18 DIAGNOSIS — E113593 Type 2 diabetes mellitus with proliferative diabetic retinopathy without macular edema, bilateral: Secondary | ICD-10-CM | POA: Diagnosis not present

## 2018-12-18 DIAGNOSIS — H43811 Vitreous degeneration, right eye: Secondary | ICD-10-CM | POA: Diagnosis not present

## 2018-12-18 DIAGNOSIS — H4321 Crystalline deposits in vitreous body, right eye: Secondary | ICD-10-CM | POA: Diagnosis not present

## 2018-12-19 ENCOUNTER — Other Ambulatory Visit: Payer: Medicare Other

## 2018-12-19 ENCOUNTER — Ambulatory Visit: Payer: Medicare Other | Admitting: Internal Medicine

## 2018-12-31 ENCOUNTER — Telehealth: Payer: Self-pay | Admitting: Cardiology

## 2018-12-31 NOTE — Telephone Encounter (Signed)
New Message    Patient needs print out showing that he has sleep apnea to take to the [place].  Please call patient back.

## 2018-12-31 NOTE — Telephone Encounter (Signed)
Called and made patient aware that note will be left downstairs for him to pick up.

## 2019-01-13 ENCOUNTER — Telehealth: Payer: Medicare Other | Admitting: Cardiology

## 2019-01-17 ENCOUNTER — Ambulatory Visit (INDEPENDENT_AMBULATORY_CARE_PROVIDER_SITE_OTHER): Payer: Medicare Other | Admitting: Podiatry

## 2019-01-17 ENCOUNTER — Encounter: Payer: Self-pay | Admitting: Podiatry

## 2019-01-17 ENCOUNTER — Other Ambulatory Visit: Payer: Self-pay

## 2019-01-17 DIAGNOSIS — M79675 Pain in left toe(s): Secondary | ICD-10-CM | POA: Diagnosis not present

## 2019-01-17 DIAGNOSIS — M79674 Pain in right toe(s): Secondary | ICD-10-CM | POA: Diagnosis not present

## 2019-01-17 DIAGNOSIS — B351 Tinea unguium: Secondary | ICD-10-CM

## 2019-01-17 DIAGNOSIS — E1159 Type 2 diabetes mellitus with other circulatory complications: Secondary | ICD-10-CM

## 2019-01-17 DIAGNOSIS — E1142 Type 2 diabetes mellitus with diabetic polyneuropathy: Secondary | ICD-10-CM | POA: Diagnosis not present

## 2019-01-17 NOTE — Progress Notes (Signed)
Complaint:  Visit Type: Patient returns to my office for continued preventative foot care services. Complaint: Patient states" my nails have grown long and thick and become painful to walk and wear shoes" Patient has been diagnosed with DM with vascular  complications. The patient presents for preventative foot care services. No changes to ROS  Podiatric Exam: Vascular: dorsalis pedis and posterior tibial pulses are not  palpable bilateral. Capillary return is immediate. Temperature gradient is WNL. Skin turgor WNL  Sensorium: Normal Semmes Weinstein monofilament test. Normal tactile sensation bilaterally. Nail Exam: Pt has thick disfigured discolored nails with subungual debris noted bilateral entire nail hallux through fifth toenails Ulcer Exam: There is no evidence of ulcer or pre-ulcerative changes or infection. Orthopedic Exam: Muscle tone and strength are WNL. No limitations in general ROM. No crepitus or effusions noted. Foot type and digits show no abnormalities. Skin: No Porokeratosis. No infection or ulcers  Diagnosis:  Onychomycosis, , Pain in right toe, pain in left toes  Treatment & Plan Procedures and Treatment: Consent by patient was obtained for treatment procedures.   Debridement of mycotic and hypertrophic toenails, 1 through 5 bilateral and clearing of subungual debris. No ulceration, no infection noted. ABN signed for 2019. Return Visit-Office Procedure: Patient instructed to return to the office for a follow up visit 3 months for continued evaluation and treatment.    Oreatha Fabry DPM 

## 2019-01-20 ENCOUNTER — Telehealth: Payer: Self-pay

## 2019-01-20 NOTE — Progress Notes (Signed)
Virtual Visit via Telephone  Note   This visit type was conducted due to national recommendations for restrictions regarding the COVID-19 Pandemic (e.g. social distancing) in an effort to limit this patient's exposure and mitigate transmission in our community.  Due to his co-morbid illnesses, this patient is at least at moderate risk for complications without adequate follow up.  This format is felt to be most appropriate for this patient at this time.  All issues noted in this document were discussed and addressed.  A limited physical exam was performed with this format.  Please refer to the patient's chart for his consent to telehealth for [facility].   Evaluation Performed:  Follow-up visit  This visit type was conducted due to national recommendations for restrictions regarding the COVID-19 Pandemic (e.g. social distancing).  This format is felt to be most appropriate for this patient at this time.  All issues noted in this document were discussed and addressed.  No physical exam was performed (except for noted visual exam findings with Video Visits).  Please refer to the patient's chart (MyChart message for video visits and phone note for telephone visits) for the patient's consent to telehealth for [facility].  Date:  01/21/2019   ID:  Allen Dyer, DOB [birth year], MRN [id number]  Patient Location:  Home  Provider location:   [place]  PCP:  [name], MD  Cardiologist:  [name], MD/[name], MD Electrophysiologist:  None   Chief Complaint:  OSA, CHF, HTN  History of Present Illness:    Allen Dyer is a 83 y.o. male who presents via audio/video conferencing for a telehealth visit today.    This is a 83yo male with a history of type 2 diabetes mellitus, rheumatic fever, hypertension, peripheral neuropathy with edema, hyperlipidemia, chronic CKD stage III, prostate CA status post XRT.  He was seen by PCP due to shortness of breath and lower extremity  edema and ultimately diagnosed with acute CHF.   2D echo 05/29/2018 showed moderate to severe LV dysfunction with EF 30 to 35% with moderately dilated LV and grade 2 diastolic dysfunction, moderate pulmonary hypertension with PASP 59 mmHg.   He underwent R/L heart cath showing mildly elevated right and left heart filling pressures with moderate pulmonary venous HTN with normal PVR 1.7 and preserved CO. He had nonobstructive CAD with 50% pLADHe was changed form Lasix to Torsemide 62m BID.   Repeat 2D echo on guideline directed HF therapy revealed persistent LV dysfunction with EF 25-30% and grade 2 DD and mild to moderate AR.  He is followed in AHF clinic.   He was initially thought to have multiple myeloma 7/20 but after a tumor board mtg he was felt not to have myeloma.  He was dx with severe OSA with an AHI of 59/hr and nocturnal hypoxemia with O2 desats as low as 63% and was titrated and now on BiPAP at 19/15cm H2O.  He is doing well with his PAP device and thinks that He has gotten used to it.  He tolerates the mask and feels the pressure is adequate.  Since going on PAP he feels rested in the am and has no significant daytime sleepiness.  He denies any significant mouth or nasal dryness or nasal congestion.  He does not think that he snores.     The patient does not have symptoms concerning for COVID-19 infection (fever, chills, cough, or new shortness of breath).    Prior CV studies:   The following studies were  reviewed today:  PAP compliance download  Past Medical History:  Diagnosis Date  . Chronic combined systolic and diastolic CHF (congestive heart failure) ([place])   . CKD (chronic kidney disease), stage III ([place])   . Hyperlipidemia   . Hypertension   . Lower extremity edema   . Mild CAD    a. mild-mod by cath 05/2018.  . Multiple myeloma ([place]) 11/15/2018  . NICM (nonischemic cardiomyopathy) ([place])   . Peripheral neuropathy   . Prostate cancer ([place])    Status post XRT  . Rheumatic  fever   . Trifascicular block   . Type 2 diabetes mellitus ([place])    Past Surgical History:  Procedure Laterality Date  . RIGHT HEART CATH N/A 07/10/2018   Procedure: RIGHT HEART CATH;  Surgeon: Larey Dresser, MD;  Location: [facility];  Service: Cardiovascular;  Laterality: N/A;  . RIGHT/LEFT HEART CATH AND CORONARY ANGIOGRAPHY N/A 06/18/2018   Procedure: RIGHT/LEFT HEART CATH AND CORONARY ANGIOGRAPHY;  Surgeon: Troy Sine, MD;  Location: [facility];  Service: Cardiovascular;  Laterality: N/A;     No outpatient medications have been marked as taking for the 01/21/19 encounter (Telemedicine) with [name], MD.     Allergies:   Tramadol, Finasteride, Lisinopril, Ciprofloxacin, Simvastatin, and Sulfa antibiotics   Social History   Tobacco Use  . Smoking status: Former Research scientist (life sciences)  . Smokeless tobacco: Never Used  Substance Use Topics  . Alcohol use: Never    Frequency: Never  . Drug use: Never     Family Hx: The patient's family history includes Cancer in his mother and sister; Congestive Heart Failure in his brother and father.  ROS:   Please see the history of present illness.     All other systems reviewed and are negative.   Labs/Other Tests and Data Reviewed:    Recent Labs: 07/01/2018: NT-Pro BNP 5,013 07/03/2018: B Natriuretic Peptide 1,606.3; Magnesium 2.2 10/10/2018: ALT 16 11/04/2018: Hemoglobin 11.6; Platelets 157 11/21/2018: BUN 29; Creatinine, Ser 1.97; Potassium 3.5; Sodium 140   Recent Lipid Panel Lab Results  Component Value Date/Time   CHOL 108 07/10/2018 03:37 AM   TRIG 68 07/10/2018 03:37 AM   HDL 32 (L) 07/10/2018 03:37 AM   CHOLHDL 3.4 07/10/2018 03:37 AM   LDLCALC 62 07/10/2018 03:37 AM    Wt Readings from Last 3 Encounters:  01/21/19 208 lb 6 oz (94.5 kg)  11/22/18 213 lb 6.4 oz (96.8 kg)  11/21/18 211 lb 9.6 oz (96 kg)     Objective:    Vital Signs:  BP (!) 141/74   Pulse 61   Ht 5' 7"  (1.702 m)   Wt 208 lb 6 oz  (94.5 kg)   BMI 32.64 kg/m    ASSESSMENT & PLAN:    1.  OSA - The patient is tolerating PAP therapy well without any problems. The PAP download was reviewed today and showed an AHI of 8.4/hr on 19/15 cm H2O with 100% compliance in using more than 4 hours nightly.  The patient has been using and benefiting from PAP use and will continue to benefit from therapy. His AHI is elevated likely due to the mask leak (max leak 106 on download).  I have encouraged him check with his DME in regards to his mask leak. He may need a new cushion.  I will have my nurse call his DME.    2.  Chronic combined systolic/diastolic CHF -nonischemic with 50% pLAD at cath 05/2018 -his  weight is stable (211lbs in July) and has no edema -SOB is stable -continue Torsemide 31m BID, Imdur/Hydralazine, spiro and BB -He just took his BP meds and usually BP will drop after that to 110/63mg -repeat 2D echo with persistent HF.  Likely not a candidate for advanced therapies with ICD due to advanced age   3.68 HTN -BP controlled -continue on spiro 2583maily, Hydralazine 37.5mg20mD and Carvedilol 9.375mg16m.  4.  Obesity - I have encouraged he to get into a routine exercise program and cut back on carbs and portions.    COVID-19 Education: The signs and symptoms of COVID-19 were discussed with the patient and how to seek care for testing (follow up with PCP or arrange E-visit).  The importance of social distancing was discussed today.  Patient Risk:   After full review of this patient's clinical status, I feel that they are at least moderate risk at this time.  Time:   Today, I have spent 20 minutes directly with the patient on telemedicine discussing medical problems including OSA, CHF, HTN.  We also reviewed the symptoms of COVID 19 and the ways to protect against contracting the virus with telehealth technology.  I spent an additional 5 minutes reviewing patient's chart including PAP compliance download.  Medication  Adjustments/Labs and Tests Ordered: Current medicines are reviewed at length with the patient today.  Concerns regarding medicines are outlined above.  Tests Ordered: No orders of the defined types were placed in this encounter.  Medication Changes: No orders of the defined types were placed in this encounter.   Disposition:  Follow up in 1 year(s)  Signed, TraciFransico Him 01/21/2019 8:31 AM    [facility]

## 2019-01-20 NOTE — Telephone Encounter (Signed)
MEDS REVIEWED   YOUR CARDIOLOGY TEAM HAS ARRANGED FOR AN E-VISIT FOR YOUR APPOINTMENT - PLEASE REVIEW IMPORTANT INFORMATION BELOW SEVERAL DAYS PRIOR TO YOUR APPOINTMENT  Due to the recent COVID-19 pandemic, we are transitioning in-person office visits to tele-medicine visits in an effort to decrease unnecessary exposure to our patients, their families, and staff. These visits are billed to your insurance just like a normal visit is. We also encourage you to sign up for MyChart if you have not already done so. You will need a smartphone if possible. For patients that do not have this, we can still complete the visit using a regular telephone but do prefer a smartphone to enable video when possible. You may have a family member that lives with you that can help. If possible, we also ask that you have a blood pressure cuff and scale at home to measure your blood pressure, heart rate and weight prior to your scheduled appointment. Patients with clinical needs that need an in-person evaluation and testing will still be able to come to the office if absolutely necessary. If you have any questions, feel free to call our office.    YOUR PROVIDER WILL BE USING THE FOLLOWING PLATFORM TO COMPLETE YOUR VISIT: Doximiy  . IF USING MYCHART - How to Download the MyChart App to Your SmartPhone   - If Apple, go to [employer] and type in MyChart in the search bar and download the app. If Android, ask patient to go to [employer] and type in [place] in the search bar and download the app. The app is free but as with any other app downloads, your phone may require you to verify saved payment information or Apple/Android password.  - You will need to then log into the app with your MyChart username and password, and select [facility] as your healthcare provider to link the account.  - When it is time for your visit, go to the MyChart app, find appointments, and click Begin Video Visit. Be sure to Select Allow for your  device to access the Microphone and Camera for your visit. You will then be connected, and your provider will be with you shortly.  **If you have any issues connecting or need assistance, please contact MyChart service desk [phone])**  **If using a computer, in order to ensure the best quality for your visit, you will need to use either of the following Internet Browsers: Insurance underwriter or [employer]**  . IF USING DOXIMITY or DOXY.ME - The staff will give you instructions on receiving your link to join the meeting the day of your visit.      2-3 DAYS BEFORE YOUR APPOINTMENT  You will receive a telephone call from one of our [place] team members - your caller ID may say "Unknown caller." If this is a video visit, we will walk you through how to get the video launched on your phone. We will remind you check your blood pressure, heart rate and weight prior to your scheduled appointment. If you have an Apple Watch or Kardia, please upload any pertinent ECG strips the day before or morning of your appointment to [facility]. Our staff will also make sure you have reviewed the consent and agree to move forward with your scheduled tele-health visit.     THE DAY OF YOUR APPOINTMENT  Approximately 15 minutes prior to your scheduled appointment, you will receive a telephone call from one of [facility] team - your caller ID may say "  Unknown caller."  Our staff will confirm medications, vital signs for the day and any symptoms you may be experiencing. Please have this information available prior to the time of visit start. It may also be helpful for you to have a pad of paper and pen handy for any instructions given during your visit. They will also walk you through joining the smartphone meeting if this is a video visit.    CONSENT FOR TELE-HEALTH VISIT - PLEASE REVIEW  I hereby voluntarily request, consent and authorize [facility] and its employed or contracted physicians,  physician assistants, nurse practitioners or other licensed health care professionals (the Practitioner), to provide me with telemedicine health care services (the "Services") as deemed necessary by the treating Practitioner. I acknowledge and consent to receive the Services by the Practitioner via telemedicine. I understand that the telemedicine visit will involve communicating with the Practitioner through live audiovisual communication technology and the disclosure of certain medical information by electronic transmission. I acknowledge that I have been given the opportunity to request an in-person assessment or other available alternative prior to the telemedicine visit and am voluntarily participating in the telemedicine visit.  I understand that I have the right to withhold or withdraw my consent to the use of telemedicine in the course of my care at any time, without affecting my right to future care or treatment, and that the Practitioner or I may terminate the telemedicine visit at any time. I understand that I have the right to inspect all information obtained and/or recorded in the course of the telemedicine visit and may receive copies of available information for a reasonable fee.  I understand that some of the potential risks of receiving the Services via telemedicine include:  Marland Kitchen Delay or interruption in medical evaluation due to technological equipment failure or disruption; . Information transmitted may not be sufficient (e.g. poor resolution of images) to allow for appropriate medical decision making by the Practitioner; and/or  . In rare instances, security protocols could fail, causing a breach of personal health information.  Furthermore, I acknowledge that it is my responsibility to provide information about my medical history, conditions and care that is complete and accurate to the best of my ability. I acknowledge that Practitioner's advice, recommendations, and/or decision may be  based on factors not within their control, such as incomplete or inaccurate data provided by me or distortions of diagnostic images or specimens that may result from electronic transmissions. I understand that the practice of medicine is not an exact science and that Practitioner makes no warranties or guarantees regarding treatment outcomes. I acknowledge that I will receive a copy of this consent concurrently upon execution via email to the email address I last provided but may also request a printed copy by calling the office of [facility].    I understand that my insurance will be billed for this visit.   I have read or had this consent read to me. . I understand the contents of this consent, which adequately explains the benefits and risks of the Services being provided via telemedicine.  . I have been provided ample opportunity to ask questions regarding this consent and the Services and have had my questions answered to my satisfaction. . I give my informed consent for the services to be provided through the use of telemedicine in my medical care  By participating in this telemedicine visit I agree to the above.

## 2019-01-21 ENCOUNTER — Other Ambulatory Visit: Payer: Self-pay

## 2019-01-21 ENCOUNTER — Telehealth: Payer: Self-pay | Admitting: *Deleted

## 2019-01-21 ENCOUNTER — Telehealth (INDEPENDENT_AMBULATORY_CARE_PROVIDER_SITE_OTHER): Payer: Medicare Other | Admitting: Cardiology

## 2019-01-21 VITALS — BP 141/74 | HR 61 | Ht 67.0 in | Wt 208.4 lb

## 2019-01-21 DIAGNOSIS — I10 Essential (primary) hypertension: Secondary | ICD-10-CM | POA: Diagnosis not present

## 2019-01-21 DIAGNOSIS — G4733 Obstructive sleep apnea (adult) (pediatric): Secondary | ICD-10-CM

## 2019-01-21 DIAGNOSIS — I5022 Chronic systolic (congestive) heart failure: Secondary | ICD-10-CM

## 2019-01-21 NOTE — Telephone Encounter (Signed)
-----   Message from [name], MD sent at 01/21/2019  8:41 AM EDT ----- Please call DME to get patient a new cushion for a his mask. Followup with me in 1 year

## 2019-01-21 NOTE — Patient Instructions (Signed)
Medication Instructions:  Your physician recommends that you continue on your current medications as directed. Please refer to the Current Medication list given to you today.  If you need a refill on your cardiac medications before your next appointment, please call your pharmacy.   Lab work: None If you have labs (blood work) drawn today and your tests are completely normal, you will receive your results only by: Marland Kitchen MyChart Message (if you have MyChart) OR . A paper copy in the mail If you have any lab test that is abnormal or we need to change your treatment, we will call you to review the results.  Testing/Procedures: None  Follow-Up: At [facility], you and your health needs are our priority.  As part of our continuing mission to provide you with exceptional heart care, we have created designated Provider Care Teams.  These Care Teams include your primary Cardiologist (physician) and Advanced Practice Providers (APPs -  Physician Assistants and Nurse Practitioners) who all work together to provide you with the care you need, when you need it. You will need a follow up appointment in 12 months.  Please call our office 2 months in advance to schedule this appointment.  You may see [name], MD or one of the following Advanced Practice Providers on your designated Care Team:   [place], PA-C Melina Copa, PA-C . Ermalinda Barrios, PA-C  Any Other Special Instructions Will Be Listed Below (If Applicable).

## 2019-01-21 NOTE — Telephone Encounter (Signed)
Per Ivin Booty at [facility] she will contact patient for new cushions for his mask.

## 2019-01-22 DIAGNOSIS — H6121 Impacted cerumen, right ear: Secondary | ICD-10-CM | POA: Diagnosis not present

## 2019-01-22 DIAGNOSIS — H60392 Other infective otitis externa, left ear: Secondary | ICD-10-CM | POA: Diagnosis not present

## 2019-02-05 DIAGNOSIS — G5601 Carpal tunnel syndrome, right upper limb: Secondary | ICD-10-CM | POA: Diagnosis not present

## 2019-02-18 ENCOUNTER — Other Ambulatory Visit: Payer: Self-pay

## 2019-02-18 ENCOUNTER — Ambulatory Visit (HOSPITAL_COMMUNITY)
Admission: RE | Admit: 2019-02-18 | Discharge: 2019-02-18 | Disposition: A | Discharge status: Home / Self Care | Payer: Medicare Other | Source: Ambulatory Visit | Attending: Cardiology | Admitting: Cardiology

## 2019-02-18 ENCOUNTER — Encounter (HOSPITAL_COMMUNITY): Payer: Self-pay | Admitting: Cardiology

## 2019-02-18 VITALS — BP 141/69 | HR 62 | Wt 218.6 lb

## 2019-02-18 DIAGNOSIS — I5022 Chronic systolic (congestive) heart failure: Secondary | ICD-10-CM | POA: Diagnosis not present

## 2019-02-18 DIAGNOSIS — Z809 Family history of malignant neoplasm, unspecified: Secondary | ICD-10-CM | POA: Diagnosis not present

## 2019-02-18 DIAGNOSIS — C9000 Multiple myeloma not having achieved remission: Secondary | ICD-10-CM | POA: Diagnosis not present

## 2019-02-18 DIAGNOSIS — E1122 Type 2 diabetes mellitus with diabetic chronic kidney disease: Secondary | ICD-10-CM | POA: Insufficient documentation

## 2019-02-18 DIAGNOSIS — Z7982 Long term (current) use of aspirin: Secondary | ICD-10-CM | POA: Diagnosis not present

## 2019-02-18 DIAGNOSIS — R9431 Abnormal electrocardiogram [ECG] [EKG]: Secondary | ICD-10-CM | POA: Diagnosis not present

## 2019-02-18 DIAGNOSIS — Z87891 Personal history of nicotine dependence: Secondary | ICD-10-CM | POA: Insufficient documentation

## 2019-02-18 DIAGNOSIS — G4733 Obstructive sleep apnea (adult) (pediatric): Secondary | ICD-10-CM | POA: Insufficient documentation

## 2019-02-18 DIAGNOSIS — Z7984 Long term (current) use of oral hypoglycemic drugs: Secondary | ICD-10-CM | POA: Insufficient documentation

## 2019-02-18 DIAGNOSIS — I428 Other cardiomyopathies: Secondary | ICD-10-CM | POA: Insufficient documentation

## 2019-02-18 DIAGNOSIS — Z888 Allergy status to other drugs, medicaments and biological substances status: Secondary | ICD-10-CM | POA: Insufficient documentation

## 2019-02-18 DIAGNOSIS — N1832 Chronic kidney disease, stage 3b: Secondary | ICD-10-CM

## 2019-02-18 DIAGNOSIS — I2510 Atherosclerotic heart disease of native coronary artery without angina pectoris: Secondary | ICD-10-CM | POA: Diagnosis not present

## 2019-02-18 DIAGNOSIS — Z8546 Personal history of malignant neoplasm of prostate: Secondary | ICD-10-CM | POA: Insufficient documentation

## 2019-02-18 DIAGNOSIS — I130 Hypertensive heart and chronic kidney disease with heart failure and stage 1 through stage 4 chronic kidney disease, or unspecified chronic kidney disease: Secondary | ICD-10-CM | POA: Insufficient documentation

## 2019-02-18 DIAGNOSIS — Z882 Allergy status to sulfonamides status: Secondary | ICD-10-CM | POA: Insufficient documentation

## 2019-02-18 DIAGNOSIS — I272 Pulmonary hypertension, unspecified: Secondary | ICD-10-CM | POA: Diagnosis not present

## 2019-02-18 DIAGNOSIS — Z79899 Other long term (current) drug therapy: Secondary | ICD-10-CM | POA: Diagnosis not present

## 2019-02-18 DIAGNOSIS — Z885 Allergy status to narcotic agent status: Secondary | ICD-10-CM | POA: Diagnosis not present

## 2019-02-18 DIAGNOSIS — Z8249 Family history of ischemic heart disease and other diseases of the circulatory system: Secondary | ICD-10-CM | POA: Diagnosis not present

## 2019-02-18 DIAGNOSIS — I4510 Unspecified right bundle-branch block: Secondary | ICD-10-CM | POA: Diagnosis not present

## 2019-02-18 DIAGNOSIS — Z881 Allergy status to other antibiotic agents status: Secondary | ICD-10-CM | POA: Diagnosis not present

## 2019-02-18 DIAGNOSIS — F1021 Alcohol dependence, in remission: Secondary | ICD-10-CM | POA: Diagnosis present

## 2019-02-18 DIAGNOSIS — N183 Chronic kidney disease, stage 3 unspecified: Secondary | ICD-10-CM | POA: Insufficient documentation

## 2019-02-18 LAB — BASIC METABOLIC PANEL
Anion gap: 11 (ref 5–15)
BUN: 18 mg/dL (ref 8–23)
CO2: 29 mmol/L (ref 22–32)
Calcium: 9.5 mg/dL (ref 8.9–10.3)
Chloride: 100 mmol/L (ref 98–111)
Creatinine, Ser: 2.01 mg/dL — ABNORMAL HIGH (ref 0.61–1.24)
GFR calc Af Amer: 35 mL/min — ABNORMAL LOW (ref >60)
GFR calc non Af Amer: 30 mL/min — ABNORMAL LOW (ref >60)
Glucose, Bld: 139 mg/dL — ABNORMAL HIGH (ref 70–99)
Potassium: 3.7 mmol/L (ref 3.5–5.1)
Sodium: 140 mmol/L (ref 135–145)

## 2019-02-18 MED ORDER — CARVEDILOL 12.5 MG PO TABS: 12.5000 mg | ORAL_TABLET | Freq: Two times a day (BID) | ORAL | 6 refills | Status: DC

## 2019-02-18 MED ORDER — HYDRALAZINE HCL 50 MG PO TABS: 50.0000 mg | ORAL_TABLET | Freq: Three times a day (TID) | ORAL | 3 refills | Status: DC

## 2019-02-18 NOTE — Patient Instructions (Signed)
INCREASE Coreg 12.5mg  (1 tab) twice a day  INCREASE Hydralazine to 50mg  (1 tab) three times a day  Labs today We will only contact you if something comes back abnormal or we need to make some changes. Otherwise no news is good news!  Your physician recommends that you schedule a follow-up appointment in: 3 months with Dr Aundra Dubin  At the [facility], you and your health needs are our priority. As part of our continuing mission to provide you with exceptional heart care, we have created designated Provider Care Teams. These Care Teams include your primary Cardiologist (physician) and Advanced Practice Providers (APPs- Physician Assistants and Nurse Practitioners) who all work together to provide you with the care you need, when you need it.   You may see any of the following providers on your designated Care Team at your next follow up: Marland Kitchen Dr Glori Bickers . Dr Loralie Champagne . Darrick Grinder, NP . Lyda Jester, PA   Please be sure to bring in all your medications bottles to every appointment.

## 2019-02-19 NOTE — Progress Notes (Signed)
Date:  02/19/2019   ID:  Waldemar Siegel, DOB [birth year], MRN [id number]    Provider location: [facility] Type of Visit: Established patient   PCP:  [name], MD  Cardiologist:  [name], MD HF Cardiology: Dr Aundra Dubin   History of Present Illness: Allen Dyer is a 83 y.o. male with a history of DM, rheumatic fever, HTN, hyperlipidemia, CKD 3, prostate CA s/p XRT, systolic HF due to NICM (EF 30-35%) 05/2018,pulmonary venous HTN, and mild nonobstructive CAD.  Admitted 3/11-3/18/20 with volume overload. Diuresed with IV lasix. HF team consulted with concern for pulmonary HTN due to ongoing oxygen need. RHC completed and showed moderate pulmonary venousHTN with normal PVR 1.7 (no PAH). Transitioned from Lasix to torsemide 40 mg BID at DC. Oxygen weaned off. HF medications optimized as able. Limited by CKD. DC weight 213 lbs.   Patient was diagnosed with multiple myeloma versus smoldering myeloma in 7/20, his hematologist is working on the plan for treatment.   He returns for followup of CHF.  He is now using CPAP and feels like this is helping a lot with his fatigue and daytime sleepiness. He is doing well symptomatically.  No dyspnea walking on flat ground.  He can climb a flight of stairs without much trouble.  No chest pain.  No lightheadedness.   ECG (personally reviewed): NSR, RBBB, LAFB, 1st degree AVB 210 msec  Labs (7/20): creatinine 2.29 => 1.97, K 3.5, hgb 11.6.    PMH: 1. CKD: Stage 3.  2. Hyperlipidemia: Statin intolerant.  3. HTN 4. Multiple myeloma versus smoldering myeloma: Diagnosed in 7/20.  5. Prostate cancer s/p XRT.  6. Type 2 diabetes.  7. Chronic systolic CHF: Nonischemic cardiomyopathy.  - RHC/LHC (2/20): Nonobstructive CAD with 50% pLAD; mean RA 13, PA 72/27 mean 47, mean PCWP 29, CI 2.8, PVR 3 WU - Echo (2/20): EF 30-35%.  - RHC (3/20): mean RA 9, PA 55/20 mean 32, mean PCWP 20, CI 3.29, PVR 1.7 WU - Echo (7/20): EF  25-30%, moderate LV dilation without LVH, normal RV size and systolic function, mild-moderate AI.  8. Pulmonary venous hypertension.  - V/Q scan (3/20): No evidence for chronic PE.  9. OSA: Using CPAP.   Past Surgical History:  Procedure Laterality Date  . RIGHT HEART CATH N/A 07/10/2018   Procedure: RIGHT HEART CATH;  Surgeon: Larey Dresser, MD;  Location: [facility];  Service: Cardiovascular;  Laterality: N/A;  . RIGHT/LEFT HEART CATH AND CORONARY ANGIOGRAPHY N/A 06/18/2018   Procedure: RIGHT/LEFT HEART CATH AND CORONARY ANGIOGRAPHY;  Surgeon: Troy Sine, MD;  Location: [facility];  Service: Cardiovascular;  Laterality: N/A;     Current Outpatient Medications  Medication Sig Dispense Refill  . aspirin EC 81 MG tablet Take 81 mg by mouth every evening.     . carvedilol (COREG) 12.5 MG tablet Take 1 tablet (12.5 mg total) by mouth 2 (two) times daily. 60 tablet 6  . Cholecalciferol (VITAMIN D3 PO) Take 1 tablet by mouth daily.     . Cyanocobalamin (B-12 PO) Take 1 tablet by mouth daily.     Marland Kitchen gabapentin (NEURONTIN) 800 MG tablet Take 800 mg by mouth 2 (two) times daily.     Marland Kitchen glipiZIDE (GLUCOTROL) 10 MG tablet Take 10 mg by mouth 2 (two) times daily.     . hydrALAZINE (APRESOLINE) 50 MG tablet Take 1 tablet (50 mg total) by mouth 3 (three) times daily. [place]  tablet 3  . isosorbide mononitrate (IMDUR) 60 MG 24 hr tablet Take 1 tablet (60 mg total) by mouth daily. 90 tablet 3  . Multiple Vitamin (MULTIVITAMIN WITH MINERALS) TABS tablet Take 1 tablet by mouth 2 (two) times daily.    Marland Kitchen PRECISION XTRA TEST STRIPS test strip     . spironolactone (ALDACTONE) 25 MG tablet Take 1 tablet (25 mg total) by mouth at bedtime. 30 tablet 6  . tamsulosin (FLOMAX) 0.4 MG CAPS capsule Take 0.4 mg by mouth 2 (two) times daily.     Marland Kitchen torsemide (DEMADEX) 20 MG tablet Take 2 tablets (40 mg total) by mouth 2 (two) times daily. 60 tablet 2   No current facility-administered medications for  this encounter.     Allergies:   Tramadol, Finasteride, Lisinopril, Ciprofloxacin, Simvastatin, and Sulfa antibiotics   Social History:  The patient  reports that he has quit smoking. He has never used smokeless tobacco. He reports that he does not drink alcohol or use drugs.   Family History:  The patient's family history includes Cancer in his mother and sister; Congestive Heart Failure in his brother and father.   ROS:  Please see the history of present illness.   All other systems are personally reviewed and negative.  Vitals:   02/18/19 1407  BP: (!) 141/69  Pulse: 62  SpO2: 96%    Exam:   BP (!) 141/69   Pulse 62   Wt 99.2 kg (218 lb 9.6 oz)   SpO2 96%   BMI 34.24 kg/m  General: NAD Neck: No JVD, no thyromegaly or thyroid nodule.  Lungs: Clear to auscultation bilaterally with normal respiratory effort. CV: Nondisplaced PMI.  Heart regular S1/S2, no S3/S4, no murmur.  No peripheral edema.  No carotid bruit.  Normal pedal pulses.  Abdomen: Soft, nontender, no hepatosplenomegaly, no distention.  Skin: Intact without lesions or rashes.  Neurologic: Alert and oriented x 3.  Psych: Normal affect. Extremities: No clubbing or cyanosis.  HEENT: Normal.   Recent Labs: 07/01/2018: NT-Pro BNP 5,013 07/03/2018: B Natriuretic Peptide 1,606.3; Magnesium 2.2 10/10/2018: ALT 16 11/04/2018: Hemoglobin 11.6; Platelets 157 02/18/2019: BUN 18; Creatinine, Ser 2.01; Potassium 3.7; Sodium 140  Personally reviewed   Wt Readings from Last 3 Encounters:  02/18/19 99.2 kg (218 lb 9.6 oz)  01/21/19 94.5 kg (208 lb 6 oz)  11/22/18 96.8 kg (213 lb 6.4 oz)      ASSESSMENT AND PLAN:  1. Chronic systolic HF:  New diagnosis 05/2018, EF 30-35% by echo.Due to NICM. LHC 05/2018 with mild non-osbtructive CAD (50% proximal LAD stenosis).Has had high BP for a long time, but it has generally been controlled recently. Had palpitations in past, but very few PVCs on telemetry when in the hospital. Prior hx  of ETOH abuse, but none in 40+ years. Did not receive chemotherapy with prior prostate cancer. QRS is wide on EKG 166 ms, but it is RBBB. He has multiple myeloma but he does not have ventricular wall thickening that would be suggestive of cardiac amyloidosis.  [place] 3/20 with mildly elevated filling pressures, CO preserved, pulmonary venous HTN. No PAH. Echo in 7/20 shows that EF remains low at 25-30% with moderate LV dilation, no LVH, and normal RV.  NYHA class II symptoms.  He is not volume overloaded on exam.  - Continuetorsemide 40 mg BID. BMET today.  - No ACE/ARB/ARNI with CKD stage 3.  - Increase hydralazine to 50 mg tid and continue Imdur 60 mg daily.   -  Increase Coreg to 12.5 mg bid.  -Continuespironolactone 25 mg qhs.BMET today. - With advanced age and nonischemic etiology of cardiomyopathy, would avoid ICD.  He has RBBB so would not likely benefit from CRT.  2. CKD Stage 3: Creatinine 1.97 most recently.  - He has been referred to nephrology.  3. CAD: 50% pLAD on LHC in 2/20. No chest pain.  - He has not been able to tolerate statins but LDL has been low.  -ContinueASA 81 mg daily.  4. Severe OSA: Continue CPAP.  5. Pulmonary hypertension: He had pulmonary venous hypertension based on RHC.  6. Multiple myeloma versus smoldering myeloma:  Hematologist is deciding on treatment plan.    Followup in 3 months.      Signed, Loralie Champagne, MD  02/19/2019  [facility] [address] [facility] [place] [address] [phone] (office) [phone] (fax)

## 2019-02-20 DIAGNOSIS — Z8546 Personal history of malignant neoplasm of prostate: Secondary | ICD-10-CM | POA: Diagnosis not present

## 2019-02-20 DIAGNOSIS — E119 Type 2 diabetes mellitus without complications: Secondary | ICD-10-CM | POA: Diagnosis not present

## 2019-02-20 DIAGNOSIS — Z0001 Encounter for general adult medical examination with abnormal findings: Secondary | ICD-10-CM | POA: Diagnosis not present

## 2019-02-20 DIAGNOSIS — C9000 Multiple myeloma not having achieved remission: Secondary | ICD-10-CM | POA: Diagnosis not present

## 2019-02-20 DIAGNOSIS — Z79899 Other long term (current) drug therapy: Secondary | ICD-10-CM | POA: Diagnosis not present

## 2019-02-20 DIAGNOSIS — Z7984 Long term (current) use of oral hypoglycemic drugs: Secondary | ICD-10-CM | POA: Diagnosis not present

## 2019-02-20 DIAGNOSIS — I5022 Chronic systolic (congestive) heart failure: Secondary | ICD-10-CM | POA: Diagnosis not present

## 2019-02-24 DIAGNOSIS — E119 Type 2 diabetes mellitus without complications: Secondary | ICD-10-CM | POA: Diagnosis not present

## 2019-02-24 DIAGNOSIS — Z79899 Other long term (current) drug therapy: Secondary | ICD-10-CM | POA: Diagnosis not present

## 2019-02-24 DIAGNOSIS — Z23 Encounter for immunization: Secondary | ICD-10-CM | POA: Diagnosis not present

## 2019-02-24 DIAGNOSIS — E7800 Pure hypercholesterolemia, unspecified: Secondary | ICD-10-CM | POA: Diagnosis not present

## 2019-02-24 DIAGNOSIS — Z0001 Encounter for general adult medical examination with abnormal findings: Secondary | ICD-10-CM | POA: Diagnosis not present

## 2019-02-27 ENCOUNTER — Other Ambulatory Visit: Payer: Self-pay

## 2019-02-27 ENCOUNTER — Telehealth: Payer: Self-pay

## 2019-02-27 ENCOUNTER — Other Ambulatory Visit: Payer: Self-pay | Admitting: Hematology and Oncology

## 2019-02-27 ENCOUNTER — Inpatient Hospital Stay: Payer: Medicare Other | Attending: Hematology and Oncology

## 2019-02-27 DIAGNOSIS — C9000 Multiple myeloma not having achieved remission: Secondary | ICD-10-CM

## 2019-02-27 LAB — CBC WITH DIFFERENTIAL/PLATELET
Abs Immature Granulocytes: 0.01 10*3/uL (ref 0.00–0.07)
Basophils Absolute: 0 10*3/uL (ref 0.0–0.1)
Basophils Relative: 0 %
Eosinophils Absolute: 0.2 10*3/uL (ref 0.0–0.5)
Eosinophils Relative: 4 %
HCT: 32.7 % — ABNORMAL LOW (ref 39.0–52.0)
Hemoglobin: 10.8 g/dL — ABNORMAL LOW (ref 13.0–17.0)
Immature Granulocytes: 0 %
Lymphocytes Relative: 27 %
Lymphs Abs: 1.3 10*3/uL (ref 0.7–4.0)
MCH: 33 pg (ref 26.0–34.0)
MCHC: 33 g/dL (ref 30.0–36.0)
MCV: 100 fL (ref 80.0–100.0)
Monocytes Absolute: 0.6 10*3/uL (ref 0.1–1.0)
Monocytes Relative: 12 %
Neutro Abs: 2.6 10*3/uL (ref 1.7–7.7)
Neutrophils Relative %: 57 %
Platelets: 183 10*3/uL (ref 150–400)
RBC: 3.27 MIL/uL — ABNORMAL LOW (ref 4.22–5.81)
RDW: 13.3 % (ref 11.5–15.5)
WBC: 4.6 10*3/uL (ref 4.0–10.5)
nRBC: 0 % (ref 0.0–0.2)

## 2019-02-27 LAB — COMPREHENSIVE METABOLIC PANEL
ALT: 13 U/L (ref 0–44)
AST: 15 U/L (ref 15–41)
Albumin: 3.7 g/dL (ref 3.5–5.0)
Alkaline Phosphatase: 71 U/L (ref 38–126)
Anion gap: 10 (ref 5–15)
BUN: 35 mg/dL — ABNORMAL HIGH (ref 8–23)
CO2: 30 mmol/L (ref 22–32)
Calcium: 9.3 mg/dL (ref 8.9–10.3)
Chloride: 101 mmol/L (ref 98–111)
Creatinine, Ser: 2.53 mg/dL — ABNORMAL HIGH (ref 0.61–1.24)
GFR calc Af Amer: 27 mL/min — ABNORMAL LOW (ref >60)
GFR calc non Af Amer: 23 mL/min — ABNORMAL LOW (ref >60)
Glucose, Bld: 95 mg/dL (ref 70–99)
Potassium: 4.1 mmol/L (ref 3.5–5.1)
Sodium: 141 mmol/L (ref 135–145)
Total Bilirubin: 0.4 mg/dL (ref 0.3–1.2)
Total Protein: 7.4 g/dL (ref 6.5–8.1)

## 2019-02-27 NOTE — Telephone Encounter (Signed)
Called and offered earlier appt on 11/12 at 11:30, one family member can come in for appt. He is agreeable to earlier appt. Appt time changed.

## 2019-02-28 LAB — KAPPA/LAMBDA LIGHT CHAINS
Kappa free light chain: 121.2 mg/L — ABNORMAL HIGH (ref 3.3–19.4)
Kappa, lambda light chain ratio: 4.75 — ABNORMAL HIGH (ref 0.26–1.65)
Lambda free light chains: 25.5 mg/L (ref 5.7–26.3)

## 2019-03-03 LAB — MULTIPLE MYELOMA PANEL, SERUM
Albumin SerPl Elph-Mcnc: 3.5 g/dL (ref 2.9–4.4)
Albumin/Glob SerPl: 1.1 (ref 0.7–1.7)
Alpha 1: 0.2 g/dL (ref 0.0–0.4)
Alpha2 Glob SerPl Elph-Mcnc: 0.6 g/dL (ref 0.4–1.0)
B-Globulin SerPl Elph-Mcnc: 1.2 g/dL (ref 0.7–1.3)
Gamma Glob SerPl Elph-Mcnc: 1.1 g/dL (ref 0.4–1.8)
Globulin, Total: 3.2 g/dL (ref 2.2–3.9)
IgA: 655 mg/dL — ABNORMAL HIGH (ref 61–437)
IgG (Immunoglobin G), Serum: 1212 mg/dL (ref 603–1613)
IgM (Immunoglobulin M), Srm: 32 mg/dL (ref 15–143)
M Protein SerPl Elph-Mcnc: 0.3 g/dL — ABNORMAL HIGH
Total Protein ELP: 6.7 g/dL (ref 6.0–8.5)

## 2019-03-06 ENCOUNTER — Ambulatory Visit: Payer: Medicare Other | Admitting: Hematology and Oncology

## 2019-03-06 ENCOUNTER — Other Ambulatory Visit: Payer: Self-pay

## 2019-03-06 ENCOUNTER — Inpatient Hospital Stay (HOSPITAL_BASED_OUTPATIENT_CLINIC_OR_DEPARTMENT_OTHER): Payer: Medicare Other | Admitting: Hematology and Oncology

## 2019-03-06 ENCOUNTER — Encounter: Payer: Self-pay | Admitting: Hematology and Oncology

## 2019-03-06 VITALS — BP 141/69 | HR 59 | Temp 98.2°F | Resp 18 | Ht 67.0 in | Wt 215.9 lb

## 2019-03-06 DIAGNOSIS — C9000 Multiple myeloma not having achieved remission: Secondary | ICD-10-CM

## 2019-03-06 DIAGNOSIS — D649 Anemia, unspecified: Secondary | ICD-10-CM | POA: Diagnosis not present

## 2019-03-06 DIAGNOSIS — N183 Chronic kidney disease, stage 3 unspecified: Secondary | ICD-10-CM | POA: Diagnosis not present

## 2019-03-06 NOTE — Progress Notes (Signed)
[facility] OFFICE PROGRESS NOTE  Patient Care Team: Koirala, Dibas, MD as PCP - General (Family Medicine) [name], MD as PCP - Cardiology (Cardiology)  ASSESSMENT & PLAN:  Multiple myeloma [facility]) I reviewed his test result with the patient and his daughter Based on his blood work, the patient have MGUS The patient was recently diagnosed with congestive heart failure and has longstanding history of diabetes that both can contribute to elevated creatinine He has no signs of cancer progression For now, the patient has no symptoms I will see him again in August for further follow-up and blood work  CKD (chronic kidney disease), stage III ([place]) He had recent acute on chronic renal failure I do believe that his renal failure is unrelated to the plasma cell disorder He will continue medical management for now  Mild chronic anemia This is likely anemia of chronic disease. The patient denies recent history of bleeding such as epistaxis, hematuria or hematochezia. He is asymptomatic from the anemia. We will observe for now.     Orders Placed This Encounter  Procedures  . Comprehensive metabolic panel    Standing Status:   Future    Standing Expiration Date:   04/09/2020  . CBC with Differential    Standing Status:   Future    Standing Expiration Date:   04/09/2020  . Kappa/lambda light chains    Standing Status:   Future    Standing Expiration Date:   04/09/2020  . Multiple Myeloma Panel (SPEP&IFE w/QIG)    Standing Status:   Future    Standing Expiration Date:   04/09/2020    INTERVAL HISTORY: Please see below for problem oriented charting. He returns for further follow-up with his daughter He feels well No recent infection, fever or chills No new bone pain He has mild intermittent bilateral lower extremity edema secondary to congestive heart failure, currently on medical management  SUMMARY OF ONCOLOGIC HISTORY: Oncology History  Multiple myeloma ([place])   10/17/2018 Imaging   1. Interim clearing of congestive heart failure. Persistent cardiomegaly.   2.  No evidence of metastatic disease or myeloma   11/04/2018 Bone Marrow Biopsy   Bone Marrow, Aspirate,Biopsy, and Clot, right ilium - PLASMA CELL NEOPLASM, SEE COMMENT. - ATYPICAL MEGAKARYOCYTES. PERIPHERAL BLOOD: - NORMOCYTIC ANEMIA. Diagnosis Note The marrow is mildly hypercellular for age with increased plasma cells (15% by CD138 immunohistochemistry). These findings are consistent with a plasma cell neoplasm. Correlation with clinical, radiographic, and laboratory data is required for further classification. There are a few atypical megakaryocytes the significance of which is unclear.  Del 13q noted but normal cytogenetics [place] red stain is negative for amyloid     REVIEW OF SYSTEMS:   Constitutional: Denies fevers, chills or abnormal weight loss Eyes: Denies blurriness of vision Ears, nose, mouth, throat, and face: Denies mucositis or sore throat Respiratory: Denies cough, dyspnea or wheezes Cardiovascular: Denies palpitation, chest discomfort  Gastrointestinal:  Denies nausea, heartburn or change in bowel habits Skin: Denies abnormal skin rashes Lymphatics: Denies new lymphadenopathy or easy bruising Neurological:Denies numbness, tingling or new weaknesses Behavioral/Psych: Mood is stable, no new changes  All other systems were reviewed with the patient and are negative.  I have reviewed the past medical history, past surgical history, social history and family history with the patient and they are unchanged from previous note.  ALLERGIES:  is allergic to tramadol; finasteride; lisinopril; ciprofloxacin; simvastatin; and sulfa antibiotics.  MEDICATIONS:  Current Outpatient Medications  Medication Sig Dispense  Refill  . aspirin EC 81 MG tablet Take 81 mg by mouth every evening.     . carvedilol (COREG) 12.5 MG tablet Take 1 tablet (12.5 mg total) by mouth 2 (two) times  daily. 60 tablet 6  . Cholecalciferol (VITAMIN D3 PO) Take 1 tablet by mouth daily.     . Cyanocobalamin (B-12 PO) Take 1 tablet by mouth daily.     Marland Kitchen gabapentin (NEURONTIN) 800 MG tablet Take 800 mg by mouth 2 (two) times daily.     Marland Kitchen glipiZIDE (GLUCOTROL) 10 MG tablet Take 10 mg by mouth 2 (two) times daily.     . hydrALAZINE (APRESOLINE) 50 MG tablet Take 1 tablet (50 mg total) by mouth 3 (three) times daily. 90 tablet 3  . isosorbide mononitrate (IMDUR) 60 MG 24 hr tablet Take 1 tablet (60 mg total) by mouth daily. 90 tablet 3  . Multiple Vitamin (MULTIVITAMIN WITH MINERALS) TABS tablet Take 1 tablet by mouth 2 (two) times daily.    Marland Kitchen PRECISION XTRA TEST STRIPS test strip     . spironolactone (ALDACTONE) 25 MG tablet Take 1 tablet (25 mg total) by mouth at bedtime. 30 tablet 6  . tamsulosin (FLOMAX) 0.4 MG CAPS capsule Take 0.4 mg by mouth 2 (two) times daily.     Marland Kitchen torsemide (DEMADEX) 20 MG tablet Take 2 tablets (40 mg total) by mouth 2 (two) times daily. 60 tablet 2   No current facility-administered medications for this visit.     PHYSICAL EXAMINATION: ECOG PERFORMANCE STATUS: 1 - Symptomatic but completely ambulatory  Vitals:   03/06/19 1140  BP: (!) 141/69  Pulse: (!) 59  Resp: 18  Temp: 98.2 F (36.8 C)  SpO2: 99%   Filed Weights   03/06/19 1140  Weight: 215 lb 14.4 oz (97.9 kg)    GENERAL:alert, no distress and comfortable Musculoskeletal:no cyanosis of digits and no clubbing  NEURO: alert & oriented x 3 with fluent speech, no focal motor/sensory deficits  LABORATORY DATA:  I have reviewed the data as listed    Component Value Date/Time   NA 141 02/27/2019 1328   NA 143 07/01/2018 1233   K 4.1 02/27/2019 1328   CL 101 02/27/2019 1328   CO2 30 02/27/2019 1328   GLUCOSE 95 02/27/2019 1328   BUN 35 (H) 02/27/2019 1328   BUN 27 07/01/2018 1233   CREATININE 2.53 (H) 02/27/2019 1328   CREATININE 2.06 (H) 10/10/2018 1404   CALCIUM 9.3 02/27/2019 1328   PROT 7.4  02/27/2019 1328   ALBUMIN 3.7 02/27/2019 1328   AST 15 02/27/2019 1328   AST 20 10/10/2018 1404   ALT 13 02/27/2019 1328   ALT 16 10/10/2018 1404   ALKPHOS 71 02/27/2019 1328   BILITOT 0.4 02/27/2019 1328   BILITOT 0.4 10/10/2018 1404   GFRNONAA 23 (L) 02/27/2019 1328   GFRNONAA 29 (L) 10/10/2018 1404   GFRAA 27 (L) 02/27/2019 1328   GFRAA 34 (L) 10/10/2018 1404    Lab Results  Component Value Date   SPEP Comment 10/10/2018    Lab Results  Component Value Date   WBC 4.6 02/27/2019   NEUTROABS 2.6 02/27/2019   HGB 10.8 (L) 02/27/2019   HCT 32.7 (L) 02/27/2019   MCV 100.0 02/27/2019   PLT 183 02/27/2019      Chemistry      Component Value Date/Time   NA 141 02/27/2019 1328   NA 143 07/01/2018 1233   K 4.1 02/27/2019 1328   CL 101  02/27/2019 1328   CO2 30 02/27/2019 1328   BUN 35 (H) 02/27/2019 1328   BUN 27 07/01/2018 1233   CREATININE 2.53 (H) 02/27/2019 1328   CREATININE 2.06 (H) 10/10/2018 1404      Component Value Date/Time   CALCIUM 9.3 02/27/2019 1328   ALKPHOS 71 02/27/2019 1328   AST 15 02/27/2019 1328   AST 20 10/10/2018 1404   ALT 13 02/27/2019 1328   ALT 16 10/10/2018 1404   BILITOT 0.4 02/27/2019 1328   BILITOT 0.4 10/10/2018 1404      All questions were answered. The patient knows to call the clinic with any problems, questions or concerns. No barriers to learning was detected.  I spent 10 minutes counseling the patient face to face. The total time spent in the appointment was 15 minutes and more than 50% was on counseling and review of test results  Heath Lark, MD 03/06/2019 12:25 PM

## 2019-03-06 NOTE — Assessment & Plan Note (Signed)
He had recent acute on chronic renal failure I do believe that his renal failure is unrelated to the plasma cell disorder He will continue medical management for now

## 2019-03-06 NOTE — Assessment & Plan Note (Signed)
This is likely anemia of chronic disease. The patient denies recent history of bleeding such as epistaxis, hematuria or hematochezia. He is asymptomatic from the anemia. We will observe for now.  

## 2019-03-06 NOTE — Assessment & Plan Note (Signed)
I reviewed his test result with the patient and his daughter Based on his blood work, the patient have MGUS The patient was recently diagnosed with congestive heart failure and has longstanding history of diabetes that both can contribute to elevated creatinine He has no signs of cancer progression For now, the patient has no symptoms I will see him again in August for further follow-up and blood work

## 2019-03-07 ENCOUNTER — Telehealth: Payer: Self-pay | Admitting: Hematology and Oncology

## 2019-03-07 NOTE — Telephone Encounter (Signed)
Scheduled appt per 11/12 sch message - pt is aware of appt date and time.

## 2019-03-19 DIAGNOSIS — H938X3 Other specified disorders of ear, bilateral: Secondary | ICD-10-CM | POA: Diagnosis not present

## 2019-03-24 DIAGNOSIS — K5900 Constipation, unspecified: Secondary | ICD-10-CM | POA: Diagnosis not present

## 2019-03-24 DIAGNOSIS — Z86010 Personal history of colonic polyps: Secondary | ICD-10-CM | POA: Diagnosis not present

## 2019-04-11 ENCOUNTER — Encounter: Payer: Self-pay | Admitting: Podiatry

## 2019-04-11 ENCOUNTER — Other Ambulatory Visit: Payer: Self-pay

## 2019-04-11 ENCOUNTER — Ambulatory Visit (INDEPENDENT_AMBULATORY_CARE_PROVIDER_SITE_OTHER): Payer: Medicare Other | Admitting: Podiatry

## 2019-04-11 DIAGNOSIS — E1159 Type 2 diabetes mellitus with other circulatory complications: Secondary | ICD-10-CM | POA: Diagnosis not present

## 2019-04-11 DIAGNOSIS — B351 Tinea unguium: Secondary | ICD-10-CM | POA: Diagnosis not present

## 2019-04-11 DIAGNOSIS — M79674 Pain in right toe(s): Secondary | ICD-10-CM

## 2019-04-11 DIAGNOSIS — M79675 Pain in left toe(s): Secondary | ICD-10-CM

## 2019-04-11 NOTE — Progress Notes (Signed)
Complaint:  Visit Type: Patient returns to my office for continued preventative foot care services. Complaint: Patient states" my nails have grown long and thick and become painful to walk and wear shoes" Patient has been diagnosed with DM with vascular  complications. The patient presents for preventative foot care services. No changes to ROS  Podiatric Exam: Vascular: dorsalis pedis and posterior tibial pulses are not  palpable bilateral. Capillary return is immediate. Temperature gradient is WNL. Skin turgor WNL  Sensorium: Normal Semmes Weinstein monofilament test. Normal tactile sensation bilaterally. Nail Exam: Pt has thick disfigured discolored nails with subungual debris noted bilateral entire nail hallux through fifth toenails Ulcer Exam: There is no evidence of ulcer or pre-ulcerative changes or infection. Orthopedic Exam: Muscle tone and strength are WNL. No limitations in general ROM. No crepitus or effusions noted. Foot type and digits show no abnormalities. Skin: No Porokeratosis. No infection or ulcers.  Masceration 4/5  B/L.  Diagnosis:  Onychomycosis, , Pain in right toe, pain in left toes  Treatment & Plan Procedures and Treatment: Consent by patient was obtained for treatment procedures.   Debridement of mycotic and hypertrophic toenails, 1 through 5 bilateral and clearing of subungual debris. No ulceration, no infection noted. Apply gentian violet 4th interspace  B/L. Return Visit-Office Procedure: Patient instructed to return to the office for a follow up visit 3 months for continued evaluation and treatment.    Gardiner Barefoot DPM

## 2019-04-14 DIAGNOSIS — C61 Malignant neoplasm of prostate: Secondary | ICD-10-CM | POA: Diagnosis not present

## 2019-04-16 ENCOUNTER — Other Ambulatory Visit (HOSPITAL_COMMUNITY): Payer: Self-pay

## 2019-04-16 DIAGNOSIS — R8279 Other abnormal findings on microbiological examination of urine: Secondary | ICD-10-CM | POA: Diagnosis not present

## 2019-04-16 DIAGNOSIS — C61 Malignant neoplasm of prostate: Secondary | ICD-10-CM | POA: Diagnosis not present

## 2019-04-16 MED ORDER — CARVEDILOL 12.5 MG PO TABS: 12.5000 mg | ORAL_TABLET | Freq: Two times a day (BID) | ORAL | 6 refills | Status: DC

## 2019-04-23 ENCOUNTER — Other Ambulatory Visit (HOSPITAL_COMMUNITY): Payer: Self-pay

## 2019-04-23 MED ORDER — CARVEDILOL 12.5 MG PO TABS: 12.5000 mg | ORAL_TABLET | Freq: Two times a day (BID) | ORAL | 6 refills | Status: DC

## 2019-05-07 DIAGNOSIS — Z87891 Personal history of nicotine dependence: Secondary | ICD-10-CM | POA: Diagnosis not present

## 2019-05-07 DIAGNOSIS — H93A2 Pulsatile tinnitus, left ear: Secondary | ICD-10-CM | POA: Diagnosis not present

## 2019-05-07 DIAGNOSIS — H6123 Impacted cerumen, bilateral: Secondary | ICD-10-CM | POA: Diagnosis not present

## 2019-05-09 DIAGNOSIS — H903 Sensorineural hearing loss, bilateral: Secondary | ICD-10-CM | POA: Diagnosis not present

## 2019-05-10 DIAGNOSIS — M5030 Other cervical disc degeneration, unspecified cervical region: Secondary | ICD-10-CM | POA: Diagnosis not present

## 2019-05-21 DIAGNOSIS — Z1159 Encounter for screening for other viral diseases: Secondary | ICD-10-CM | POA: Diagnosis not present

## 2019-05-22 ENCOUNTER — Ambulatory Visit (HOSPITAL_COMMUNITY)
Admission: RE | Admit: 2019-05-22 | Discharge: 2019-05-22 | Disposition: A | Discharge status: Home / Self Care | Payer: Medicare Other | Source: Ambulatory Visit | Attending: Cardiology | Admitting: Cardiology

## 2019-05-22 ENCOUNTER — Other Ambulatory Visit: Payer: Self-pay

## 2019-05-22 ENCOUNTER — Encounter (HOSPITAL_COMMUNITY): Payer: Self-pay | Admitting: Cardiology

## 2019-05-22 VITALS — BP 129/64 | HR 59 | Wt 222.6 lb

## 2019-05-22 DIAGNOSIS — R079 Chest pain, unspecified: Secondary | ICD-10-CM | POA: Diagnosis not present

## 2019-05-22 DIAGNOSIS — I130 Hypertensive heart and chronic kidney disease with heart failure and stage 1 through stage 4 chronic kidney disease, or unspecified chronic kidney disease: Secondary | ICD-10-CM | POA: Diagnosis present

## 2019-05-22 DIAGNOSIS — E785 Hyperlipidemia, unspecified: Secondary | ICD-10-CM | POA: Insufficient documentation

## 2019-05-22 DIAGNOSIS — N1832 Chronic kidney disease, stage 3b: Secondary | ICD-10-CM

## 2019-05-22 DIAGNOSIS — I440 Atrioventricular block, first degree: Secondary | ICD-10-CM | POA: Insufficient documentation

## 2019-05-22 DIAGNOSIS — Z881 Allergy status to other antibiotic agents status: Secondary | ICD-10-CM | POA: Diagnosis not present

## 2019-05-22 DIAGNOSIS — I428 Other cardiomyopathies: Secondary | ICD-10-CM | POA: Insufficient documentation

## 2019-05-22 DIAGNOSIS — Z888 Allergy status to other drugs, medicaments and biological substances status: Secondary | ICD-10-CM | POA: Insufficient documentation

## 2019-05-22 DIAGNOSIS — Z79899 Other long term (current) drug therapy: Secondary | ICD-10-CM | POA: Diagnosis not present

## 2019-05-22 DIAGNOSIS — I5022 Chronic systolic (congestive) heart failure: Secondary | ICD-10-CM | POA: Insufficient documentation

## 2019-05-22 DIAGNOSIS — E1122 Type 2 diabetes mellitus with diabetic chronic kidney disease: Secondary | ICD-10-CM | POA: Insufficient documentation

## 2019-05-22 DIAGNOSIS — Z7984 Long term (current) use of oral hypoglycemic drugs: Secondary | ICD-10-CM | POA: Diagnosis not present

## 2019-05-22 DIAGNOSIS — Z809 Family history of malignant neoplasm, unspecified: Secondary | ICD-10-CM | POA: Insufficient documentation

## 2019-05-22 DIAGNOSIS — Z882 Allergy status to sulfonamides status: Secondary | ICD-10-CM | POA: Insufficient documentation

## 2019-05-22 DIAGNOSIS — I452 Bifascicular block: Secondary | ICD-10-CM | POA: Insufficient documentation

## 2019-05-22 DIAGNOSIS — C9000 Multiple myeloma not having achieved remission: Secondary | ICD-10-CM | POA: Insufficient documentation

## 2019-05-22 DIAGNOSIS — Z87891 Personal history of nicotine dependence: Secondary | ICD-10-CM | POA: Insufficient documentation

## 2019-05-22 DIAGNOSIS — I272 Pulmonary hypertension, unspecified: Secondary | ICD-10-CM | POA: Diagnosis not present

## 2019-05-22 DIAGNOSIS — I2510 Atherosclerotic heart disease of native coronary artery without angina pectoris: Secondary | ICD-10-CM | POA: Insufficient documentation

## 2019-05-22 DIAGNOSIS — Z885 Allergy status to narcotic agent status: Secondary | ICD-10-CM | POA: Insufficient documentation

## 2019-05-22 DIAGNOSIS — N183 Chronic kidney disease, stage 3 unspecified: Secondary | ICD-10-CM | POA: Diagnosis not present

## 2019-05-22 DIAGNOSIS — F1021 Alcohol dependence, in remission: Secondary | ICD-10-CM | POA: Insufficient documentation

## 2019-05-22 DIAGNOSIS — I4510 Unspecified right bundle-branch block: Secondary | ICD-10-CM | POA: Insufficient documentation

## 2019-05-22 DIAGNOSIS — Z8546 Personal history of malignant neoplasm of prostate: Secondary | ICD-10-CM | POA: Insufficient documentation

## 2019-05-22 DIAGNOSIS — Z7982 Long term (current) use of aspirin: Secondary | ICD-10-CM | POA: Diagnosis not present

## 2019-05-22 DIAGNOSIS — I444 Left anterior fascicular block: Secondary | ICD-10-CM | POA: Insufficient documentation

## 2019-05-22 DIAGNOSIS — Z8249 Family history of ischemic heart disease and other diseases of the circulatory system: Secondary | ICD-10-CM | POA: Insufficient documentation

## 2019-05-22 DIAGNOSIS — G4733 Obstructive sleep apnea (adult) (pediatric): Secondary | ICD-10-CM | POA: Diagnosis not present

## 2019-05-22 DIAGNOSIS — Z923 Personal history of irradiation: Secondary | ICD-10-CM | POA: Insufficient documentation

## 2019-05-22 LAB — BASIC METABOLIC PANEL
Anion gap: 7 (ref 5–15)
BUN: 29 mg/dL — ABNORMAL HIGH (ref 8–23)
CO2: 31 mmol/L (ref 22–32)
Calcium: 9.4 mg/dL (ref 8.9–10.3)
Chloride: 101 mmol/L (ref 98–111)
Creatinine, Ser: 2.28 mg/dL — ABNORMAL HIGH (ref 0.61–1.24)
GFR calc Af Amer: 30 mL/min — ABNORMAL LOW (ref >60)
GFR calc non Af Amer: 26 mL/min — ABNORMAL LOW (ref >60)
Glucose, Bld: 122 mg/dL — ABNORMAL HIGH (ref 70–99)
Potassium: 3.7 mmol/L (ref 3.5–5.1)
Sodium: 139 mmol/L (ref 135–145)

## 2019-05-22 MED ORDER — HYDRALAZINE HCL 50 MG PO TABS: 75.0000 mg | ORAL_TABLET | Freq: Three times a day (TID) | ORAL | 3 refills | Status: DC

## 2019-05-22 MED ORDER — ISOSORBIDE MONONITRATE ER 60 MG PO TB24: 90.0000 mg | ORAL_TABLET | Freq: Every day | ORAL | 3 refills | Status: DC

## 2019-05-22 NOTE — Patient Instructions (Signed)
Increase Hydralazine to 75 mg (1 & 1/2 tabs) Three times a day   Increase Isosorbide to 90 mg (1 & 1/2 tabs) daily  Labs done today, your results will be available in MyChart, we will contact you for abnormal readings.  Your physician has requested that you have en exercise stress myoview. For further information please visit [email or web address]. Please follow instruction sheet, as given.  Your physician recommends that you schedule a follow-up appointment in: 3 months  If you have any questions or concerns before your next appointment please send [place] a message through [place] or call our office at [phone].  At the [facility], you and your health needs are our priority. As part of our continuing mission to provide you with exceptional heart care, we have created designated Provider Care Teams. These Care Teams include your primary Cardiologist (physician) and Advanced Practice Providers (APPs- Physician Assistants and Nurse Practitioners) who all work together to provide you with the care you need, when you need it.   You may see any of the following providers on your designated Care Team at your next follow up: Marland Kitchen Dr Glori Bickers . Dr Loralie Champagne . Darrick Grinder, NP . Lyda Jester, PA . Audry Riles, PharmD    How to Prepare for Your Myoview Test (stress test):  1. Please do not take these medications the morning of your test:  Carvedilol, Glipizide, Torsemide 2. Your remaining medications may be taken with water. 3. Nothing to eat or drink, except water, 4 hours prior to arrival time.  NO caffeine/decaffeinated products, or chocolate 12 hours prior to arrival. 4. Ladies, please do not wear dresses.  Skirts or pants are approprate, please wear a short sleeve shirt. 5. NO perfume, cologne or lotion 6. Wear comfortable walking shoes.  NO HEELS! 7. Total time is 3 to 4 hours; you may want to bring reading material for the waiting time. 8. Please report to  [facility] for your test  What to expect after you arrive:  Once you arrive and check in for your appointment an IV will be started in your arm.  Then the Technoligist will inject a small amount of radioactive tracer.  There will be a 1 hour waiting period after this injection.  A series of pictures will be taken of your heart following this waiting period.  You will be prepped for the stress portion of the test.  During the stress portion of your test you will either walk on a treadmill or receive a small, safe amount of radioactive tracer injected in your IV.  After the stress portion, there is a short rest period during which time your heart and blood pressure will be monitored.  After the short rest period the Technologist will begin your second set of pictures.  Your doctor will inform you of your test results within 7-10 business days.  In preparation for your appointment, medication and supplies will be purchased.  Appointment availability is limited, so if you need to cancel or reschedule please call the office at [phone] 24 hours in advance to avoid a cancellation fee of $100.00  IF [place], [place] TECHNOLOGIST.

## 2019-05-23 NOTE — Progress Notes (Signed)
Date:  05/23/2019   ID:  Allen Dyer, DOB Apr 23, 1936, MRN [id number]    Provider location: [facility] Type of Visit: Established patient   PCP:  [name], MD  Cardiologist:  [name], MD HF Cardiology: Dr Aundra Dubin   History of Present Illness: Allen Dyer is a 84 y.o. male with a history of DM, rheumatic fever, HTN, hyperlipidemia, CKD 3, prostate CA s/p XRT, systolic HF due to NICM (EF 30-35%) 05/2018,pulmonary venous HTN, and mild nonobstructive CAD.  Admitted 3/11-3/18/20 with volume overload. Diuresed with IV lasix. HF team consulted with concern for pulmonary HTN due to ongoing oxygen need. RHC completed and showed moderate pulmonary venousHTN with normal PVR 1.7 (no PAH). Transitioned from Lasix to torsemide 40 mg BID at DC. Oxygen weaned off. HF medications optimized as able. Limited by CKD. DC weight 213 lbs.   Patient was diagnosed with smoldering myeloma in 7/20, no treatment planned at this time.   He returns for followup of CHF.  He is using CPAP.  He has had a couple of episodes of chest pain.  One was a month ago and one was earlier today.  She will get about 2 hours of mild chest heaviness that is not related to exertion.  ?Associated with emotional stress.  She is generally doing well symptomatically.  No dyspnea walking up a flight of stairs. He notes mild dyspnea when chasing after his great-granddaughter.  No edema. No palpitations.  No lightheadedness/syncope.    ECG (personally reviewed): NSR, RBBB, LAFB, 1st degree AVB  Labs (7/20): creatinine 2.29 => 1.97, K 3.5, hgb 11.6.  Labs (10/20): Creatinine 2.0 Labs (11/20): creatinine 2.5   PMH: 1. CKD: Stage 3.  2. Hyperlipidemia: Statin intolerant.  3. HTN 4. Multiple myeloma versus smoldering myeloma: Diagnosed in 7/20.  - Bone marrow biopsy stained negative for amyloidosis.  5. Prostate cancer s/p XRT.  6. Type 2 diabetes.  7. Chronic systolic CHF: Nonischemic  cardiomyopathy.  - RHC/LHC (2/20): Nonobstructive CAD with 50% pLAD; mean RA 13, PA 72/27 mean 47, mean PCWP 29, CI 2.8, PVR 3 WU - Echo (2/20): EF 30-35%.  - RHC (3/20): mean RA 9, PA 55/20 mean 32, mean PCWP 20, CI 3.29, PVR 1.7 WU - Echo (7/20): EF 25-30%, moderate LV dilation without LVH, normal RV size and systolic function, mild-moderate AI.  8. Pulmonary venous hypertension.  - V/Q scan (3/20): No evidence for chronic PE.  9. OSA: Using CPAP.   Past Surgical History:  Procedure Laterality Date  . RIGHT HEART CATH N/A 07/10/2018   Procedure: RIGHT HEART CATH;  Surgeon: Larey Dresser, MD;  Location: [facility];  Service: Cardiovascular;  Laterality: N/A;  . RIGHT/LEFT HEART CATH AND CORONARY ANGIOGRAPHY N/A 06/18/2018   Procedure: RIGHT/LEFT HEART CATH AND CORONARY ANGIOGRAPHY;  Surgeon: Troy Sine, MD;  Location: [facility];  Service: Cardiovascular;  Laterality: N/A;     Current Outpatient Medications  Medication Sig Dispense Refill  . aspirin EC 81 MG tablet Take 81 mg by mouth every evening.     . carvedilol (COREG) 12.5 MG tablet Take 1 tablet (12.5 mg total) by mouth 2 (two) times daily. 60 tablet 6  . Cholecalciferol (VITAMIN D3 PO) Take 1 tablet by mouth daily.     . cyanocobalamin 100 MCG tablet Take by mouth.    . gabapentin (NEURONTIN) 800 MG tablet Take 800 mg by mouth 2 (two) times daily.     Marland Kitchen  glipiZIDE (GLUCOTROL) 10 MG tablet Take 10 mg by mouth 2 (two) times daily.     . hydrALAZINE (APRESOLINE) 50 MG tablet Take 1.5 tablets (75 mg total) by mouth 3 (three) times daily. 135 tablet 3  . isosorbide mononitrate (IMDUR) 60 MG 24 hr tablet Take 1.5 tablets (90 mg total) by mouth daily. 45 tablet 3  . Multiple Vitamin (MULTIVITAMIN WITH MINERALS) TABS tablet Take 1 tablet by mouth 2 (two) times daily.    Marland Kitchen NIFEdipine (ADALAT CC) 30 MG 24 hr tablet nifedipine ER 30 mg tablet,extended release  1 tablet by mouth daily    . PRECISION XTRA TEST STRIPS  test strip     . spironolactone (ALDACTONE) 25 MG tablet Take 1 tablet (25 mg total) by mouth at bedtime. 30 tablet 6  . tamsulosin (FLOMAX) 0.4 MG CAPS capsule Take 0.4 mg by mouth 2 (two) times daily.     Marland Kitchen torsemide (DEMADEX) 20 MG tablet Take 2 tablets (40 mg total) by mouth 2 (two) times daily. 60 tablet 2   No current facility-administered medications for this encounter.    Allergies:   Tramadol, Finasteride, Lisinopril, Ciprofloxacin, Simvastatin, and Sulfa antibiotics   Social History:  The patient  reports that he has quit smoking. He has never used smokeless tobacco. He reports that he does not drink alcohol or use drugs.   Family History:  The patient's family history includes Cancer in his mother and sister; Congestive Heart Failure in his brother and father.   ROS:  Please see the history of present illness.   All other systems are personally reviewed and negative.  Vitals:   05/22/19 1334  BP: 129/64  Pulse: (!) 59  SpO2: 98%    Exam:   BP 129/64   Pulse (!) 59   Wt 101 kg (222 lb 9.6 oz)   SpO2 98%   BMI 34.86 kg/m  General: NAD Neck: No JVD, no thyromegaly or thyroid nodule.  Lungs: Clear to auscultation bilaterally with normal respiratory effort. CV: Nondisplaced PMI.  Heart regular S1/S2, no S3/S4, no murmur.  Trace ankle edema.  No carotid bruit.  Normal pedal pulses.  Abdomen: Soft, nontender, no hepatosplenomegaly, no distention.  Skin: Intact without lesions or rashes.  Neurologic: Alert and oriented x 3.  Psych: Normal affect. Extremities: No clubbing or cyanosis.  HEENT: Normal.   Recent Labs: 07/01/2018: NT-Pro BNP 5,013 07/03/2018: B Natriuretic Peptide 1,606.3; Magnesium 2.2 02/27/2019: ALT 13; Hemoglobin 10.8; Platelets 183 05/22/2019: BUN 29; Creatinine, Ser 2.28; Potassium 3.7; Sodium 139  Personally reviewed   Wt Readings from Last 3 Encounters:  05/22/19 101 kg (222 lb 9.6 oz)  03/06/19 97.9 kg (215 lb 14.4 oz)  02/18/19 99.2 kg (218 lb 9.6  oz)      ASSESSMENT AND PLAN:  1. Chronic systolic HF:  New diagnosis 05/2018, EF 30-35% by echo.Due to NICM. LHC 05/2018 with mild non-osbtructive CAD (50% proximal LAD stenosis).Has had high BP for a long time, but it has generally been controlled recently. Had palpitations in past, but very few PVCs on telemetry when in the hospital. Prior hx of ETOH abuse, but none in 40+ years. Did not receive chemotherapy with prior prostate cancer. QRS is wide on EKG 166 ms, but it is RBBB. He has multiple myeloma but he does not have ventricular wall thickening that would be suggestive of cardiac amyloidosis.  [place] 3/20 with mildly elevated filling pressures, CO preserved, pulmonary venous HTN. No PAH. Echo in 7/20 shows that  EF remains low at 25-30% with moderate LV dilation, no LVH, and normal RV.  NYHA class II symptoms.  He is not volume overloaded on exam.  - Continuetorsemide 40 mg BID. BMET today.  - No ACE/ARB/ARNI with CKD stage 3.  - Increase hydralazine to 75 mg tid and Imdur to 90 mg daily.   - Continue Coreg 12.5 mg bid.  -Continuespironolactone 25 mg qhs.BMET today. - With advanced age and nonischemic etiology of cardiomyopathy, would avoid ICD.  He has RBBB so would not likely benefit from CRT.  2. CKD Stage 3: Creatinine 2.5 most recently.  - He has been referred to nephrology.  3. CAD: 50% pLAD on LHC in 2/20. He has had a couple of prolonged episodes of atypical chest pain.  - I will order an ETT-Cardiolite to assess for ischemia.  - He has not been able to tolerate statins but LDL has been low.  -ContinueASA 81 mg daily.  4. Severe OSA: Continue CPAP.  5. Pulmonary hypertension: He had pulmonary venous hypertension based on RHC.  6. Smoldering myeloma:  No plans for chemotherapy treatment for now.    Followup in 3 months if Cardiolite is negative.       Signed, Loralie Champagne, MD  05/23/2019  [facility] [address] [facility]  [place] [address] [phone] (office) [phone] (fax)

## 2019-05-26 ENCOUNTER — Other Ambulatory Visit: Payer: Self-pay | Admitting: Physician Assistant

## 2019-05-26 DIAGNOSIS — Z86010 Personal history of colonic polyps: Secondary | ICD-10-CM | POA: Diagnosis not present

## 2019-05-26 DIAGNOSIS — D123 Benign neoplasm of transverse colon: Secondary | ICD-10-CM | POA: Diagnosis not present

## 2019-05-26 DIAGNOSIS — D125 Benign neoplasm of sigmoid colon: Secondary | ICD-10-CM | POA: Diagnosis not present

## 2019-05-26 DIAGNOSIS — H93A2 Pulsatile tinnitus, left ear: Secondary | ICD-10-CM

## 2019-05-26 DIAGNOSIS — K621 Rectal polyp: Secondary | ICD-10-CM | POA: Diagnosis not present

## 2019-05-27 ENCOUNTER — Other Ambulatory Visit (HOSPITAL_COMMUNITY)
Admission: RE | Admit: 2019-05-27 | Discharge: 2019-05-27 | Disposition: A | Discharge status: Home / Self Care | Payer: Medicare Other | Source: Ambulatory Visit | Attending: Cardiology | Admitting: Cardiology

## 2019-05-27 DIAGNOSIS — Z20822 Contact with and (suspected) exposure to covid-19: Secondary | ICD-10-CM | POA: Diagnosis not present

## 2019-05-27 DIAGNOSIS — Z01812 Encounter for preprocedural laboratory examination: Secondary | ICD-10-CM | POA: Insufficient documentation

## 2019-05-27 LAB — SARS CORONAVIRUS 2 (TAT 6-24 HRS): SARS Coronavirus 2: NEGATIVE

## 2019-05-28 ENCOUNTER — Other Ambulatory Visit: Payer: Medicare Other

## 2019-05-28 ENCOUNTER — Telehealth (HOSPITAL_COMMUNITY): Payer: Self-pay | Admitting: *Deleted

## 2019-05-28 DIAGNOSIS — D125 Benign neoplasm of sigmoid colon: Secondary | ICD-10-CM | POA: Diagnosis not present

## 2019-05-28 DIAGNOSIS — D123 Benign neoplasm of transverse colon: Secondary | ICD-10-CM | POA: Diagnosis not present

## 2019-05-28 NOTE — Telephone Encounter (Signed)
Patient given detailed instructions per Myocardial Perfusion Study Information Sheet for the test on 05/30/19 at 7:30. Patient notified to arrive 15 minutes early and that it is imperative to arrive on time for appointment to keep from having the test rescheduled.  If you need to cancel or reschedule your appointment, please call the office within 24 hours of your appointment. . Patient verbalized understanding.Allen Dyer\

## 2019-05-30 ENCOUNTER — Ambulatory Visit (HOSPITAL_COMMUNITY): Payer: Medicare Other | Attending: Cardiology

## 2019-05-30 ENCOUNTER — Other Ambulatory Visit: Payer: Self-pay

## 2019-05-30 DIAGNOSIS — R079 Chest pain, unspecified: Secondary | ICD-10-CM | POA: Diagnosis not present

## 2019-05-30 LAB — MYOCARDIAL PERFUSION IMAGING
Estimated workload: 4.6 METS
Exercise duration (min): 6 min
Exercise duration (sec): 0 s
LV dias vol: 216 mL (ref 62–150)
LV sys vol: 138 mL
MPHR: 139 {beats}/min
Peak HR: 146 {beats}/min
Percent HR: 105 %
Rest HR: 59 {beats}/min
SDS: 0
SRS: 10
SSS: 11
TID: 1.02

## 2019-05-30 MED ORDER — TECHNETIUM TC 99M TETROFOSMIN IV KIT
10.4000 | PACK | Freq: Once | INTRAVENOUS | Status: AC | PRN
  Administered 2019-05-30: 08:00:00 10.4 via INTRAVENOUS
  Filled 2019-05-30: qty 11

## 2019-05-30 MED ORDER — TECHNETIUM TC 99M TETROFOSMIN IV KIT
33.0000 | PACK | Freq: Once | INTRAVENOUS | Status: AC | PRN
  Administered 2019-05-30: 10:00:00 33 via INTRAVENOUS
  Filled 2019-05-30: qty 33

## 2019-06-02 DIAGNOSIS — E119 Type 2 diabetes mellitus without complications: Secondary | ICD-10-CM | POA: Diagnosis not present

## 2019-06-03 ENCOUNTER — Ambulatory Visit
Admission: RE | Admit: 2019-06-03 | Discharge: 2019-06-03 | Disposition: A | Discharge status: Home / Self Care | Payer: Medicare Other | Source: Ambulatory Visit | Attending: Physician Assistant | Admitting: Physician Assistant

## 2019-06-03 DIAGNOSIS — I6523 Occlusion and stenosis of bilateral carotid arteries: Secondary | ICD-10-CM | POA: Diagnosis not present

## 2019-06-03 DIAGNOSIS — H93A2 Pulsatile tinnitus, left ear: Secondary | ICD-10-CM

## 2019-06-06 ENCOUNTER — Ambulatory Visit: Payer: Medicare Other | Attending: Internal Medicine

## 2019-06-06 DIAGNOSIS — Z23 Encounter for immunization: Secondary | ICD-10-CM | POA: Insufficient documentation

## 2019-06-06 NOTE — Progress Notes (Signed)
   Covid-19 Vaccination Clinic  Name:  Allen Dyer    MRN: [id number] DOB: 1936/03/28  06/06/2019  [name] was observed post Covid-19 immunization for 15 minutes without incidence. He was provided with Vaccine Information Sheet and instruction to access the V-Safe system.   [name] was instructed to call 911 with any severe reactions post vaccine: Marland Kitchen Difficulty breathing  . Swelling of your face and throat  . A fast heartbeat  . A bad rash all over your body  . Dizziness and weakness    Immunizations Administered    Name Date Dose VIS Date Route   Pfizer COVID-19 Vaccine 06/06/2019  2:10 PM 0.3 mL 04/04/2019 Intramuscular   Manufacturer: [place]   Lot: [id number]   [place]: [address]-4

## 2019-06-17 DIAGNOSIS — N189 Chronic kidney disease, unspecified: Secondary | ICD-10-CM | POA: Diagnosis not present

## 2019-06-17 DIAGNOSIS — D472 Monoclonal gammopathy: Secondary | ICD-10-CM | POA: Diagnosis not present

## 2019-06-17 DIAGNOSIS — I129 Hypertensive chronic kidney disease with stage 1 through stage 4 chronic kidney disease, or unspecified chronic kidney disease: Secondary | ICD-10-CM | POA: Diagnosis not present

## 2019-06-17 DIAGNOSIS — C61 Malignant neoplasm of prostate: Secondary | ICD-10-CM | POA: Diagnosis not present

## 2019-06-17 DIAGNOSIS — N2581 Secondary hyperparathyroidism of renal origin: Secondary | ICD-10-CM | POA: Diagnosis not present

## 2019-06-17 DIAGNOSIS — N183 Chronic kidney disease, stage 3 unspecified: Secondary | ICD-10-CM | POA: Diagnosis not present

## 2019-06-17 DIAGNOSIS — E1122 Type 2 diabetes mellitus with diabetic chronic kidney disease: Secondary | ICD-10-CM | POA: Diagnosis not present

## 2019-06-17 DIAGNOSIS — I5020 Unspecified systolic (congestive) heart failure: Secondary | ICD-10-CM | POA: Diagnosis not present

## 2019-06-17 DIAGNOSIS — I2510 Atherosclerotic heart disease of native coronary artery without angina pectoris: Secondary | ICD-10-CM | POA: Diagnosis not present

## 2019-06-18 DIAGNOSIS — N390 Urinary tract infection, site not specified: Secondary | ICD-10-CM | POA: Diagnosis not present

## 2019-06-29 ENCOUNTER — Ambulatory Visit: Payer: Medicare Other | Attending: Internal Medicine

## 2019-06-29 DIAGNOSIS — Z23 Encounter for immunization: Secondary | ICD-10-CM | POA: Insufficient documentation

## 2019-06-29 NOTE — Progress Notes (Signed)
   Covid-19 Vaccination Clinic  Name:  Cochise Dinneen    MRN: [id number] DOB: [birth year]  06/29/2019  Mr. Hack was observed post Covid-19 immunization for 15 minutes without incident. He was provided with Vaccine Information Sheet and instruction to access the V-Safe system.   Mr. Chiasson was instructed to call 911 with any severe reactions post vaccine: Marland Kitchen Difficulty breathing  . Swelling of face and throat  . A fast heartbeat  . A bad rash all over body  . Dizziness and weakness   Immunizations Administered    Name Date Dose VIS Date Route   Pfizer COVID-19 Vaccine 06/29/2019 12:36 PM 0.3 mL 04/04/2019 Intramuscular   Manufacturer: [place]   Lot: [id number]   [place]: [address]-4

## 2019-06-30 DIAGNOSIS — I10 Essential (primary) hypertension: Secondary | ICD-10-CM | POA: Diagnosis not present

## 2019-06-30 DIAGNOSIS — Z8546 Personal history of malignant neoplasm of prostate: Secondary | ICD-10-CM | POA: Diagnosis not present

## 2019-06-30 DIAGNOSIS — N184 Chronic kidney disease, stage 4 (severe): Secondary | ICD-10-CM | POA: Diagnosis not present

## 2019-06-30 DIAGNOSIS — E7800 Pure hypercholesterolemia, unspecified: Secondary | ICD-10-CM | POA: Diagnosis not present

## 2019-06-30 DIAGNOSIS — D472 Monoclonal gammopathy: Secondary | ICD-10-CM | POA: Diagnosis not present

## 2019-06-30 DIAGNOSIS — I5022 Chronic systolic (congestive) heart failure: Secondary | ICD-10-CM | POA: Diagnosis not present

## 2019-07-02 DIAGNOSIS — H938X3 Other specified disorders of ear, bilateral: Secondary | ICD-10-CM | POA: Diagnosis not present

## 2019-07-04 ENCOUNTER — Encounter: Payer: Self-pay | Admitting: Podiatry

## 2019-07-04 ENCOUNTER — Ambulatory Visit (INDEPENDENT_AMBULATORY_CARE_PROVIDER_SITE_OTHER): Payer: Medicare Other | Admitting: Podiatry

## 2019-07-04 ENCOUNTER — Other Ambulatory Visit: Payer: Self-pay

## 2019-07-04 VITALS — Temp 98.3°F

## 2019-07-04 DIAGNOSIS — M79675 Pain in left toe(s): Secondary | ICD-10-CM | POA: Diagnosis not present

## 2019-07-04 DIAGNOSIS — M79674 Pain in right toe(s): Secondary | ICD-10-CM

## 2019-07-04 DIAGNOSIS — B351 Tinea unguium: Secondary | ICD-10-CM

## 2019-07-04 DIAGNOSIS — E1142 Type 2 diabetes mellitus with diabetic polyneuropathy: Secondary | ICD-10-CM

## 2019-07-04 NOTE — Progress Notes (Signed)
This patient returns to my office for at risk foot care.  This patient requires this care by a professional since this patient will be at risk due to having  Diabetes and chronic kidney disease.   This patient is unable to cut nails himself  since the patient cannot reach his  nails.These nails are painful walking and wearing shoes.  This patient presents for at risk foot care today.  General Appearance  Alert, conversant and in no acute stress.  Vascular  Dorsalis pedis and posterior tibial  pulses are palpable  bilaterally.  Capillary return is within normal limits  bilaterally. Temperature is within normal limits  bilaterally.  Neurologic  Senn-Weinstein monofilament wire test within normal limits  bilaterally. Muscle power within normal limits bilaterally.  Nails Thick disfigured discolored nails with subungual debris  from hallux to fifth toes bilaterally. No evidence of bacterial infection or drainage bilaterally.  Orthopedic  No limitations of motion  feet .  No crepitus or effusions noted.  No bony pathology or digital deformities noted.  Skin  normotropic skin with no porokeratosis noted bilaterally.  No signs of infections or ulcers noted.   Asymptomatic porokeratosis sub 5th  B/L.  Onychomycosis  Pain in right toes  Pain in left toes  Consent was obtained for treatment procedures.   Mechanical debridement of nails 1-5  bilaterally performed with a nail nipper.  Filed with dremel without incident. No infection or ulcer.     Return office visit          Told patient to return for periodic foot care and evaluation due to potential at risk complications.   Gardiner Barefoot DPM

## 2019-08-14 DIAGNOSIS — E113593 Type 2 diabetes mellitus with proliferative diabetic retinopathy without macular edema, bilateral: Secondary | ICD-10-CM | POA: Diagnosis not present

## 2019-08-14 DIAGNOSIS — H4321 Crystalline deposits in vitreous body, right eye: Secondary | ICD-10-CM | POA: Diagnosis not present

## 2019-08-14 DIAGNOSIS — H43813 Vitreous degeneration, bilateral: Secondary | ICD-10-CM | POA: Diagnosis not present

## 2019-08-14 DIAGNOSIS — H4312 Vitreous hemorrhage, left eye: Secondary | ICD-10-CM | POA: Diagnosis not present

## 2019-08-21 ENCOUNTER — Encounter (HOSPITAL_COMMUNITY): Payer: Self-pay | Admitting: Cardiology

## 2019-08-21 ENCOUNTER — Other Ambulatory Visit: Payer: Self-pay

## 2019-08-21 ENCOUNTER — Ambulatory Visit (HOSPITAL_COMMUNITY)
Admission: RE | Admit: 2019-08-21 | Discharge: 2019-08-21 | Disposition: A | Discharge status: Home / Self Care | Payer: Medicare Other | Source: Ambulatory Visit | Attending: Cardiology | Admitting: Cardiology

## 2019-08-21 VITALS — BP 130/62 | HR 62 | Wt 222.8 lb

## 2019-08-21 DIAGNOSIS — I2510 Atherosclerotic heart disease of native coronary artery without angina pectoris: Secondary | ICD-10-CM | POA: Insufficient documentation

## 2019-08-21 DIAGNOSIS — I5022 Chronic systolic (congestive) heart failure: Secondary | ICD-10-CM | POA: Insufficient documentation

## 2019-08-21 DIAGNOSIS — Z7984 Long term (current) use of oral hypoglycemic drugs: Secondary | ICD-10-CM | POA: Diagnosis not present

## 2019-08-21 DIAGNOSIS — Z7982 Long term (current) use of aspirin: Secondary | ICD-10-CM | POA: Insufficient documentation

## 2019-08-21 DIAGNOSIS — Z8546 Personal history of malignant neoplasm of prostate: Secondary | ICD-10-CM | POA: Diagnosis not present

## 2019-08-21 DIAGNOSIS — C9000 Multiple myeloma not having achieved remission: Secondary | ICD-10-CM | POA: Insufficient documentation

## 2019-08-21 DIAGNOSIS — I130 Hypertensive heart and chronic kidney disease with heart failure and stage 1 through stage 4 chronic kidney disease, or unspecified chronic kidney disease: Secondary | ICD-10-CM | POA: Diagnosis not present

## 2019-08-21 DIAGNOSIS — Z87891 Personal history of nicotine dependence: Secondary | ICD-10-CM | POA: Diagnosis not present

## 2019-08-21 DIAGNOSIS — Z923 Personal history of irradiation: Secondary | ICD-10-CM | POA: Insufficient documentation

## 2019-08-21 DIAGNOSIS — N183 Chronic kidney disease, stage 3 unspecified: Secondary | ICD-10-CM | POA: Diagnosis not present

## 2019-08-21 DIAGNOSIS — E1122 Type 2 diabetes mellitus with diabetic chronic kidney disease: Secondary | ICD-10-CM | POA: Diagnosis not present

## 2019-08-21 DIAGNOSIS — I272 Pulmonary hypertension, unspecified: Secondary | ICD-10-CM | POA: Insufficient documentation

## 2019-08-21 DIAGNOSIS — I4510 Unspecified right bundle-branch block: Secondary | ICD-10-CM | POA: Diagnosis not present

## 2019-08-21 DIAGNOSIS — Z8249 Family history of ischemic heart disease and other diseases of the circulatory system: Secondary | ICD-10-CM | POA: Diagnosis not present

## 2019-08-21 DIAGNOSIS — I428 Other cardiomyopathies: Secondary | ICD-10-CM | POA: Insufficient documentation

## 2019-08-21 DIAGNOSIS — Z79899 Other long term (current) drug therapy: Secondary | ICD-10-CM | POA: Diagnosis not present

## 2019-08-21 DIAGNOSIS — E785 Hyperlipidemia, unspecified: Secondary | ICD-10-CM | POA: Insufficient documentation

## 2019-08-21 DIAGNOSIS — G4733 Obstructive sleep apnea (adult) (pediatric): Secondary | ICD-10-CM | POA: Diagnosis not present

## 2019-08-21 LAB — BASIC METABOLIC PANEL
Anion gap: 12 (ref 5–15)
BUN: 26 mg/dL — ABNORMAL HIGH (ref 8–23)
CO2: 28 mmol/L (ref 22–32)
Calcium: 9.1 mg/dL (ref 8.9–10.3)
Chloride: 103 mmol/L (ref 98–111)
Creatinine, Ser: 2.39 mg/dL — ABNORMAL HIGH (ref 0.61–1.24)
GFR calc Af Amer: 28 mL/min — ABNORMAL LOW (ref >60)
GFR calc non Af Amer: 24 mL/min — ABNORMAL LOW (ref >60)
Glucose, Bld: 101 mg/dL — ABNORMAL HIGH (ref 70–99)
Potassium: 3.6 mmol/L (ref 3.5–5.1)
Sodium: 143 mmol/L (ref 135–145)

## 2019-08-21 MED ORDER — CARVEDILOL 12.5 MG PO TABS: 18.7500 mg | ORAL_TABLET | Freq: Two times a day (BID) | ORAL | 6 refills | Status: DC

## 2019-08-21 MED ORDER — TORSEMIDE 20 MG PO TABS: ORAL_TABLET | ORAL | 2 refills | Status: DC

## 2019-08-21 NOTE — Patient Instructions (Signed)
Increase Torsemide to 60 mg (3 tabs) in AM and 40 mg (2 tabs) in PM  Increase Carvedilol to 18.75 mg (1 & 1/2 tabs) Twice daily   Labs done today, your results will be available in MyChart, we will contact you for abnormal readings.  Labs needed in 1 week  Your physician recommends that you schedule a follow-up appointment in: 1 month  If you have any questions or concerns before your next appointment please send [place] a message through [place] or call our office at [phone].  At the [facility], you and your health needs are our priority. As part of our continuing mission to provide you with exceptional heart care, we have created designated Provider Care Teams. These Care Teams include your primary Cardiologist (physician) and Advanced Practice Providers (APPs- Physician Assistants and Nurse Practitioners) who all work together to provide you with the care you need, when you need it.   You may see any of the following providers on your designated Care Team at your next follow up: Marland Kitchen Dr Glori Bickers . Dr Loralie Champagne . Darrick Grinder, NP . Lyda Jester, PA . Audry Riles, PharmD   Please be sure to bring in all your medications bottles to every appointment.

## 2019-08-23 NOTE — Progress Notes (Signed)
Date:  08/23/2019   ID:  Allen Dyer, DOB [birth year], MRN [id number]    Provider location: [facility] Type of Visit: Established patient   PCP:  [name], MD  Cardiologist:  [name], MD HF Cardiology: Dr Aundra Dubin   History of Present Illness: Allen Dyer is a 84 y.o. male with a history of DM, rheumatic fever, HTN, hyperlipidemia, CKD 3, prostate CA s/p XRT, systolic HF due to NICM (EF 30-35%) 05/2018,pulmonary venous HTN, and mild nonobstructive CAD.  Admitted 3/11-3/18/20 with volume overload. Diuresed with IV lasix. HF team consulted with concern for pulmonary HTN due to ongoing oxygen need. RHC completed and showed moderate pulmonary venousHTN with normal PVR 1.7 (no PAH). Transitioned from Lasix to torsemide 40 mg BID at DC. Oxygen weaned off. HF medications optimized as able. Limited by CKD. DC weight 213 lbs.   Patient was diagnosed with smoldering myeloma in 7/20, no treatment planned at this time.   Cardiolite in 2/21 showed EF 36%, fixed anteroapical defect, no ischemia.   He returns for followup of CHF.  He is using CPAP.  No further chest pain.  He is now walking a mile several days a week, gets winded near the end of the walk.  No orthopnea/PND.  No lightheadedness. Abdomen feels full, worried that he is retaining some fluid.  Weight stable.   ECG (personally reviewed): NSR, RBBB, LAFB.   Labs (7/20): creatinine 2.29 => 1.97, K 3.5, hgb 11.6.  Labs (10/20): Creatinine 2.0 Labs (11/20): creatinine 2.5 Labs (1/21): K 3.7, creatinine 2.28   PMH: 1. CKD: Stage 3.  2. Hyperlipidemia: Statin intolerant.  3. HTN 4. Multiple myeloma versus smoldering myeloma: Diagnosed in 7/20.  - Bone marrow biopsy stained negative for amyloidosis.  5. Prostate cancer s/p XRT.  6. Type 2 diabetes.  7. Chronic systolic CHF: Nonischemic cardiomyopathy.  - RHC/LHC (2/20): Nonobstructive CAD with 50% pLAD; mean RA 13, PA 72/27 mean 47, mean PCWP 29, CI  2.8, PVR 3 WU - Echo (2/20): EF 30-35%.  - RHC (3/20): mean RA 9, PA 55/20 mean 32, mean PCWP 20, CI 3.29, PVR 1.7 WU - Echo (7/20): EF 25-30%, moderate LV dilation without LVH, normal RV size and systolic function, mild-moderate AI.  - Cardiolite (2/21): EF 36%, fixed anteroapical defect, no ischemia.  8. Pulmonary venous hypertension.  - V/Q scan (3/20): No evidence for chronic PE.  9. OSA: Using CPAP.   Past Surgical History:  Procedure Laterality Date  . RIGHT HEART CATH N/A 07/10/2018   Procedure: RIGHT HEART CATH;  Surgeon: Larey Dresser, MD;  Location: [facility];  Service: Cardiovascular;  Laterality: N/A;  . RIGHT/LEFT HEART CATH AND CORONARY ANGIOGRAPHY N/A 06/18/2018   Procedure: RIGHT/LEFT HEART CATH AND CORONARY ANGIOGRAPHY;  Surgeon: Troy Sine, MD;  Location: [facility];  Service: Cardiovascular;  Laterality: N/A;     Current Outpatient Medications  Medication Sig Dispense Refill  . aspirin EC 81 MG tablet Take 81 mg by mouth every evening.     . carvedilol (COREG) 12.5 MG tablet Take 1.5 tablets (18.75 mg total) by mouth 2 (two) times daily. 90 tablet 6  . Cholecalciferol (VITAMIN D3 PO) Take 1 tablet by mouth daily.     . cyanocobalamin 100 MCG tablet Take 100 mcg by mouth daily.     Marland Kitchen gabapentin (NEURONTIN) 800 MG tablet Take 800 mg by mouth 2 (two) times daily.     Marland Kitchen glipiZIDE (  GLUCOTROL) 10 MG tablet Take 10 mg by mouth 2 (two) times daily.     . hydrALAZINE (APRESOLINE) 50 MG tablet Take 1.5 tablets (75 mg total) by mouth 3 (three) times daily. 135 tablet 3  . isosorbide mononitrate (IMDUR) 60 MG 24 hr tablet Take 1.5 tablets (90 mg total) by mouth daily. 45 tablet 3  . Multiple Vitamin (MULTIVITAMIN WITH MINERALS) TABS tablet Take 1 tablet by mouth 2 (two) times daily.    Marland Kitchen NIFEdipine (ADALAT CC) 30 MG 24 hr tablet nifedipine ER 30 mg tablet,extended release  1 tablet by mouth daily    . PRECISION XTRA TEST STRIPS test strip     .  spironolactone (ALDACTONE) 25 MG tablet Take 1 tablet (25 mg total) by mouth at bedtime. 30 tablet 6  . tadalafil (CIALIS) 5 MG tablet Take 5 mg by mouth every 3 (three) days.    . tamsulosin (FLOMAX) 0.4 MG CAPS capsule Take 0.4 mg by mouth 2 (two) times daily.     Marland Kitchen torsemide (DEMADEX) 20 MG tablet Take 3 tablets (60 mg total) by mouth in the morning AND 2 tablets (40 mg total) every evening. 150 tablet 2   No current facility-administered medications for this encounter.    Allergies:   Tramadol, Finasteride, Lisinopril, Ciprofloxacin, Simvastatin, and Sulfa antibiotics   Social History:  The patient  reports that he has quit smoking. He has never used smokeless tobacco. He reports that he does not drink alcohol or use drugs.   Family History:  The patient's family history includes Cancer in his mother and sister; Congestive Heart Failure in his brother and father.   ROS:  Please see the history of present illness.   All other systems are personally reviewed and negative.  Vitals:   08/21/19 1420  BP: 130/62  Pulse: 62  SpO2: 94%    Exam:   BP 130/62   Pulse 62   Wt 101.1 kg (222 lb 12.8 oz)   SpO2 94%   BMI 34.90 kg/m  General: NAD Neck: JVP 8 cm with HJR, no thyromegaly or thyroid nodule.  Lungs: Clear to auscultation bilaterally with normal respiratory effort. CV: Nondisplaced PMI.  Heart regular S1/S2, no S3/S4, no murmur.  1+ edema 1/2 to knees bilaterally.   Abdomen: Soft, nontender, no hepatosplenomegaly, no distention.  Skin: Intact without lesions or rashes.  Neurologic: Alert and oriented x 3.  Psych: Normal affect. Extremities: No clubbing or cyanosis.  HEENT: Normal.   Recent Labs: 02/27/2019: ALT 13; Hemoglobin 10.8; Platelets 183 08/21/2019: BUN 26; Creatinine, Ser 2.39; Potassium 3.6; Sodium 143  Personally reviewed   Wt Readings from Last 3 Encounters:  08/21/19 101.1 kg (222 lb 12.8 oz)  05/30/19 100.7 kg (222 lb)  05/22/19 101 kg (222 lb 9.6 oz)       ASSESSMENT AND PLAN:  1. Chronic systolic HF:  New diagnosis 05/2018, EF 30-35% by echo.Due to NICM. LHC 05/2018 with mild non-osbtructive CAD (50% proximal LAD stenosis).Has had high BP for a long time, but it has generally been controlled recently. Had palpitations in past, but very few PVCs on telemetry when in the hospital. Prior hx of ETOH abuse, but none in 40+ years. Did not receive chemotherapy with prior prostate cancer. QRS is wide on EKG, but it is RBBB. He has multiple myeloma but he does not have ventricular wall thickening that would be suggestive of cardiac amyloidosis.  [place] 3/20 with mildly elevated filling pressures, CO preserved, pulmonary venous HTN. No  PAH. Echo in 7/20 shows that EF remains low at 25-30% with moderate LV dilation, no LVH, and normal RV.  NYHA class II symptoms.  He looks at least mildly volume overloaded on exam.   - Increase torsemide to 60 qam/40 qpm.  BMET today and in 10 days.   - No ACE/ARB/ARNI with CKD stage 3.  - Continue hydralazine 75 mg tid and Imdur 90 mg daily.   - Increase Coreg to 18.75 mg bid.  -Continuespironolactone 25 mg qhs. - With advanced age and nonischemic etiology of cardiomyopathy, would avoid ICD.  He has RBBB so would not likely benefit from CRT.  2. CKD Stage 3: Creatinine 2.28 most recently.  - He has been referred to nephrology.  3. CAD: 50% pLAD on LHC in 2/20. Cardiolite in 2/21 showed no ischemia.   - He has not been able to tolerate statins but LDL has been low.  -ContinueASA 81 mg daily.  4. Severe OSA: Continue CPAP.  5. Pulmonary hypertension: He had pulmonary venous hypertension based on RHC.  6. Smoldering myeloma:  No plans for chemotherapy treatment for now.    Followup with NP/PA to reassess volume in 1 month.       Signed, Loralie Champagne, MD  08/23/2019  [facility] [address] [facility] [place] [address] [phone] (office) [phone]  (fax)

## 2019-08-29 ENCOUNTER — Other Ambulatory Visit: Payer: Self-pay

## 2019-08-29 ENCOUNTER — Ambulatory Visit (HOSPITAL_COMMUNITY)
Admission: RE | Admit: 2019-08-29 | Discharge: 2019-08-29 | Disposition: A | Discharge status: Home / Self Care | Payer: Medicare Other | Source: Ambulatory Visit | Attending: Internal Medicine | Admitting: Internal Medicine

## 2019-08-29 DIAGNOSIS — I5022 Chronic systolic (congestive) heart failure: Secondary | ICD-10-CM | POA: Insufficient documentation

## 2019-08-29 LAB — BASIC METABOLIC PANEL
Anion gap: 11 (ref 5–15)
BUN: 33 mg/dL — ABNORMAL HIGH (ref 8–23)
CO2: 26 mmol/L (ref 22–32)
Calcium: 9 mg/dL (ref 8.9–10.3)
Chloride: 103 mmol/L (ref 98–111)
Creatinine, Ser: 2.56 mg/dL — ABNORMAL HIGH (ref 0.61–1.24)
GFR calc Af Amer: 26 mL/min — ABNORMAL LOW (ref >60)
GFR calc non Af Amer: 22 mL/min — ABNORMAL LOW (ref >60)
Glucose, Bld: 199 mg/dL — ABNORMAL HIGH (ref 70–99)
Potassium: 4.4 mmol/L (ref 3.5–5.1)
Sodium: 140 mmol/L (ref 135–145)

## 2019-09-02 ENCOUNTER — Telehealth (HOSPITAL_COMMUNITY): Payer: Self-pay

## 2019-09-02 MED ORDER — TORSEMIDE 20 MG PO TABS: 40.0000 mg | ORAL_TABLET | Freq: Two times a day (BID) | ORAL | 1 refills | Status: DC

## 2019-09-02 NOTE — Telephone Encounter (Signed)
Patient advised and verbalized understanding,pt has pending appt 5/27 will recheck bmet at that time. Med list updated to reflect changes

## 2019-09-02 NOTE — Telephone Encounter (Signed)
-----   Message from Larey Dresser, MD sent at 08/31/2019 10:03 PM EDT ----- Creatinine higher.  Hold torsemide for a day, then decrease to 40 mg bid. BMET 10 days

## 2019-09-08 ENCOUNTER — Other Ambulatory Visit (HOSPITAL_COMMUNITY): Payer: Self-pay

## 2019-09-08 MED ORDER — SPIRONOLACTONE 25 MG PO TABS: 25.0000 mg | ORAL_TABLET | Freq: Every day | ORAL | 11 refills | Status: DC

## 2019-09-18 ENCOUNTER — Ambulatory Visit (HOSPITAL_COMMUNITY)
Admission: RE | Admit: 2019-09-18 | Discharge: 2019-09-18 | Disposition: A | Discharge status: Home / Self Care | Payer: Medicare Other | Source: Ambulatory Visit | Attending: Cardiology | Admitting: Cardiology

## 2019-09-18 ENCOUNTER — Encounter (HOSPITAL_COMMUNITY): Payer: Self-pay

## 2019-09-18 ENCOUNTER — Other Ambulatory Visit: Payer: Self-pay

## 2019-09-18 VITALS — BP 158/80 | HR 59 | Wt 222.2 lb

## 2019-09-18 DIAGNOSIS — Z7982 Long term (current) use of aspirin: Secondary | ICD-10-CM | POA: Diagnosis not present

## 2019-09-18 DIAGNOSIS — Z8546 Personal history of malignant neoplasm of prostate: Secondary | ICD-10-CM | POA: Insufficient documentation

## 2019-09-18 DIAGNOSIS — I130 Hypertensive heart and chronic kidney disease with heart failure and stage 1 through stage 4 chronic kidney disease, or unspecified chronic kidney disease: Secondary | ICD-10-CM | POA: Diagnosis not present

## 2019-09-18 DIAGNOSIS — E785 Hyperlipidemia, unspecified: Secondary | ICD-10-CM | POA: Insufficient documentation

## 2019-09-18 DIAGNOSIS — Z7984 Long term (current) use of oral hypoglycemic drugs: Secondary | ICD-10-CM | POA: Insufficient documentation

## 2019-09-18 DIAGNOSIS — G4733 Obstructive sleep apnea (adult) (pediatric): Secondary | ICD-10-CM | POA: Diagnosis not present

## 2019-09-18 DIAGNOSIS — Z87891 Personal history of nicotine dependence: Secondary | ICD-10-CM | POA: Diagnosis not present

## 2019-09-18 DIAGNOSIS — I428 Other cardiomyopathies: Secondary | ICD-10-CM | POA: Insufficient documentation

## 2019-09-18 DIAGNOSIS — I2510 Atherosclerotic heart disease of native coronary artery without angina pectoris: Secondary | ICD-10-CM | POA: Insufficient documentation

## 2019-09-18 DIAGNOSIS — E1122 Type 2 diabetes mellitus with diabetic chronic kidney disease: Secondary | ICD-10-CM | POA: Diagnosis not present

## 2019-09-18 DIAGNOSIS — Z8249 Family history of ischemic heart disease and other diseases of the circulatory system: Secondary | ICD-10-CM | POA: Diagnosis not present

## 2019-09-18 DIAGNOSIS — C9000 Multiple myeloma not having achieved remission: Secondary | ICD-10-CM | POA: Insufficient documentation

## 2019-09-18 DIAGNOSIS — N183 Chronic kidney disease, stage 3 unspecified: Secondary | ICD-10-CM | POA: Insufficient documentation

## 2019-09-18 DIAGNOSIS — I5022 Chronic systolic (congestive) heart failure: Secondary | ICD-10-CM | POA: Diagnosis not present

## 2019-09-18 DIAGNOSIS — Z79899 Other long term (current) drug therapy: Secondary | ICD-10-CM | POA: Diagnosis not present

## 2019-09-18 DIAGNOSIS — I4510 Unspecified right bundle-branch block: Secondary | ICD-10-CM | POA: Insufficient documentation

## 2019-09-18 DIAGNOSIS — I272 Pulmonary hypertension, unspecified: Secondary | ICD-10-CM | POA: Insufficient documentation

## 2019-09-18 LAB — BASIC METABOLIC PANEL
Anion gap: 9 (ref 5–15)
BUN: 26 mg/dL — ABNORMAL HIGH (ref 8–23)
CO2: 28 mmol/L (ref 22–32)
Calcium: 8.7 mg/dL — ABNORMAL LOW (ref 8.9–10.3)
Chloride: 103 mmol/L (ref 98–111)
Creatinine, Ser: 2.33 mg/dL — ABNORMAL HIGH (ref 0.61–1.24)
GFR calc Af Amer: 29 mL/min — ABNORMAL LOW (ref >60)
GFR calc non Af Amer: 25 mL/min — ABNORMAL LOW (ref >60)
Glucose, Bld: 151 mg/dL — ABNORMAL HIGH (ref 70–99)
Potassium: 4 mmol/L (ref 3.5–5.1)
Sodium: 140 mmol/L (ref 135–145)

## 2019-09-18 LAB — BRAIN NATRIURETIC PEPTIDE: B Natriuretic Peptide: 139 pg/mL — ABNORMAL HIGH (ref 0.0–100.0)

## 2019-09-18 MED ORDER — HYDRALAZINE HCL 100 MG PO TABS: 100.0000 mg | ORAL_TABLET | Freq: Three times a day (TID) | ORAL | 6 refills | Status: DC

## 2019-09-18 NOTE — Progress Notes (Signed)
ReDS Vest / Clip - 09/18/19 1600      ReDS Vest / Clip   Station Marker  C    Ruler Value  31    ReDS Value Range  Moderate volume overload    ReDS Actual Value  36

## 2019-09-18 NOTE — Patient Instructions (Signed)
Increase Hydralazine to 100 mg Three times a day   Labs done today, your results will be available in MyChart, we will contact you for abnormal readings.  Your physician recommends that you schedule a follow-up appointment in: 2 months  If you have any questions or concerns before your next appointment please send [place] a message through [place] or call our office at [phone].    TO SPEAK WITH A NURSE SELECT OPTION 2, PLEASE LEAVE A MESSAGE INCLUDING: . YOUR NAME . DATE OF BIRTH . CALL BACK NUMBER . REASON FOR CALL  YOU WILL RECEIVE A CALL BACK THE SAME DAY AS LONG AS YOU CALL BEFORE 4:00 PM  At the [facility], you and your health needs are our priority. As part of our continuing mission to provide you with exceptional heart care, we have created designated Provider Care Teams. These Care Teams include your primary Cardiologist (physician) and Advanced Practice Providers (APPs- Physician Assistants and Nurse Practitioners) who all work together to provide you with the care you need, when you need it.   You may see any of the following providers on your designated Care Team at your next follow up: Marland Kitchen Dr Glori Bickers . Dr Loralie Champagne . Darrick Grinder, NP . Lyda Jester, PA . Audry Riles, PharmD   Please be sure to bring in all your medications bottles to every appointment.

## 2019-09-18 NOTE — Progress Notes (Signed)
Date:  09/18/2019   ID:  Allen Dyer, DOB January 07, 1936, MRN [id number]    Provider location: [facility] Type of Visit: Established patient   PCP:  [name], MD  Cardiologist:  [name], MD HF Cardiology: Dr Aundra Dubin   History of Present Illness: Allen Dyer is a 84 y.o. male with a history of DM, rheumatic fever, HTN, hyperlipidemia, CKD 3, prostate CA s/p XRT, systolic HF due to NICM (EF 30-35%) 05/2018,pulmonary venous HTN, and mild nonobstructive CAD.  Admitted 3/11-3/18/20 with volume overload. Diuresed with IV lasix. HF team consulted with concern for pulmonary HTN due to ongoing oxygen need. RHC completed and showed moderate pulmonary venousHTN with normal PVR 1.7 (no PAH). Transitioned from Lasix to torsemide 40 mg BID at DC. Oxygen weaned off. HF medications optimized as able. Limited by CKD. DC weight 213 lbs.   Patient was diagnosed with smoldering myeloma in 7/20, no treatment planned at this time.   Cardiolite in 2/21 showed EF 36%, fixed anteroapical defect, no ischemia.   He had return f/u w/ Dr. Aundra Dubin last month and was volume overloaded. Torsemide was increased to 60 qam/40 qpm. He had repeat BMP a week later and SCr had increased from 2.39>>2.56. he was instructed to hold torsemide x 1 day then decrease to 40 mg bid.   He returns today for f/u. His wt hasn't changed much but he reports significant subjective improvement. Notes good UOP w/ temporary diuretic increase. Also breathing has improved and his legs are less swollen and "less tight". He has trace edema on exam today. He is now able to mow his lawn w/o much exertional symptoms/ limitations. ReDs clip mildly elevated at 36%. BP is elevated 158/80. Reports full med compliance    Labs (7/20): creatinine 2.29 => 1.97, K 3.5, hgb 11.6.  Labs (10/20): Creatinine 2.0 Labs (11/20): creatinine 2.5 Labs (1/21): K 3.7, creatinine 2.28 Labs (5/21): K 4.4, creatinine 2.56    PMH: 1.  CKD: Stage 3.  2. Hyperlipidemia: Statin intolerant.  3. HTN 4. Multiple myeloma versus smoldering myeloma: Diagnosed in 7/20.  - Bone marrow biopsy stained negative for amyloidosis.  5. Prostate cancer s/p XRT.  6. Type 2 diabetes.  7. Chronic systolic CHF: Nonischemic cardiomyopathy.  - RHC/LHC (2/20): Nonobstructive CAD with 50% pLAD; mean RA 13, PA 72/27 mean 47, mean PCWP 29, CI 2.8, PVR 3 WU - Echo (2/20): EF 30-35%.  - RHC (3/20): mean RA 9, PA 55/20 mean 32, mean PCWP 20, CI 3.29, PVR 1.7 WU - Echo (7/20): EF 25-30%, moderate LV dilation without LVH, normal RV size and systolic function, mild-moderate AI.  - Cardiolite (2/21): EF 36%, fixed anteroapical defect, no ischemia.  8. Pulmonary venous hypertension.  - V/Q scan (3/20): No evidence for chronic PE.  9. OSA: Using CPAP.   Past Surgical History:  Procedure Laterality Date  . RIGHT HEART CATH N/A 07/10/2018   Procedure: RIGHT HEART CATH;  Surgeon: Larey Dresser, MD;  Location: [facility];  Service: Cardiovascular;  Laterality: N/A;  . RIGHT/LEFT HEART CATH AND CORONARY ANGIOGRAPHY N/A 06/18/2018   Procedure: RIGHT/LEFT HEART CATH AND CORONARY ANGIOGRAPHY;  Surgeon: Troy Sine, MD;  Location: [facility];  Service: Cardiovascular;  Laterality: N/A;     Current Outpatient Medications  Medication Sig Dispense Refill  . aspirin EC 81 MG tablet Take 81 mg by mouth every evening.     . carvedilol (COREG) 12.5 MG tablet Take  1.5 tablets (18.75 mg total) by mouth 2 (two) times daily. 90 tablet 6  . Cholecalciferol (VITAMIN D3 PO) Take 1 tablet by mouth daily.     . cyanocobalamin 100 MCG tablet Take 100 mcg by mouth daily.     Marland Kitchen gabapentin (NEURONTIN) 800 MG tablet Take 800 mg by mouth 2 (two) times daily.     Marland Kitchen glipiZIDE (GLUCOTROL) 10 MG tablet Take 10 mg by mouth 2 (two) times daily.     . hydrALAZINE (APRESOLINE) 50 MG tablet Take 1.5 tablets (75 mg total) by mouth 3 (three) times daily. 135 tablet 3  .  isosorbide mononitrate (IMDUR) 60 MG 24 hr tablet Take 1.5 tablets (90 mg total) by mouth daily. 45 tablet 3  . Multiple Vitamin (MULTIVITAMIN WITH MINERALS) TABS tablet Take 1 tablet by mouth 2 (two) times daily.    Marland Kitchen NIFEdipine (ADALAT CC) 30 MG 24 hr tablet nifedipine ER 30 mg tablet,extended release  1 tablet by mouth daily    . PRECISION XTRA TEST STRIPS test strip     . spironolactone (ALDACTONE) 25 MG tablet Take 1 tablet (25 mg total) by mouth at bedtime. 30 tablet 11  . tadalafil (CIALIS) 5 MG tablet Take 5 mg by mouth every 3 (three) days.    . tamsulosin (FLOMAX) 0.4 MG CAPS capsule Take 0.4 mg by mouth 2 (two) times daily.     Marland Kitchen torsemide (DEMADEX) 20 MG tablet Take 2 tablets (40 mg total) by mouth 2 (two) times daily. 360 tablet 1   No current facility-administered medications for this encounter.    Allergies:   Tramadol, Finasteride, Lisinopril, Ciprofloxacin, Simvastatin, and Sulfa antibiotics   Social History:  The patient  reports that he has quit smoking. He has never used smokeless tobacco. He reports that he does not drink alcohol or use drugs.   Family History:  The patient's family history includes Cancer in his mother and sister; Congestive Heart Failure in his brother and father.   ROS:  Please see the history of present illness.   All other systems are personally reviewed and negative.  Vitals:   09/18/19 1534  BP: (!) 158/80  Pulse: (!) 59  SpO2: 95%    Exam:   BP (!) 158/80   Pulse (!) 59   Wt 100.8 kg (222 lb 3.2 oz)   SpO2 95%   BMI 34.80 kg/m  PHYSICAL EXAM: General:  Well appearing elderly AAM. No respiratory difficulty HEENT: normal Neck: supple. no JVD. Carotids 2+ bilat; no bruits. No lymphadenopathy or thyromegaly appreciated. Cor: PMI nondisplaced. Regular rate & rhythm. No rubs, gallops or murmurs. Lungs: clear Abdomen: soft, nontender, nondistended. No hepatosplenomegaly. No bruits or masses. Good bowel sounds. Extremities: no cyanosis,  clubbing, rash, trace bilateral LE edema Neuro: alert & oriented x 3, cranial nerves grossly intact. moves all 4 extremities w/o difficulty. Affect pleasant.   Recent Labs: 02/27/2019: ALT 13; Hemoglobin 10.8; Platelets 183 08/29/2019: BUN 33; Creatinine, Ser 2.56; Potassium 4.4; Sodium 140  Personally reviewed   Wt Readings from Last 3 Encounters:  09/18/19 100.8 kg (222 lb 3.2 oz)  08/21/19 101.1 kg (222 lb 12.8 oz)  05/30/19 100.7 kg (222 lb)      ASSESSMENT AND PLAN:  1. Chronic systolic HF:  New diagnosis 05/2018, EF 30-35% by echo.Due to NICM. LHC 05/2018 with mild non-osbtructive CAD (50% proximal LAD stenosis).Has had high BP for a long time, but it has generally been controlled recently. Had palpitations in past, but very  few PVCs on telemetry when in the hospital. Prior hx of ETOH abuse, but none in 40+ years. Did not receive chemotherapy with prior prostate cancer. QRS is wide on EKG, but it is RBBB. He has multiple myeloma but he does not have ventricular wall thickening that would be suggestive of cardiac amyloidosis.  [place] 3/20 with mildly elevated filling pressures, CO preserved, pulmonary venous HTN. No PAH. Echo in 7/20 shows that EF remains low at 25-30% with moderate LV dilation, no LVH, and normal RV.  - NYHA Class II-III. Volume status ok.  - Continue torsemide 40 mg bid  - No ACE/ARB/ARNI with CKD stage 3.  - Increase hydralazine to 100 mg tid w/ elevated BP - Continue Imdur 90 mg daily.   - Continue Coreg 18.75 mg bid. HR limits further titration  -He is onspironolactone 25 mg qhs.Will check BMP today, may need to consider discontinuation pending SCr/K.  - With advanced age and nonischemic etiology of cardiomyopathy, would avoid ICD.  He has RBBB so would not likely benefit from CRT.   2. CKD Stage 3: Creatinine 2.56 most recently.  - He is followed by nephrology.  - repeat BMP today  3. CAD: 50% pLAD on LHC in 2/20. Cardiolite in 2/21 showed no ischemia.   -  he denies recent CP  - He has not been able to tolerate statins but LDL has been low.  -ContinueASA 81 mg daily.  - continue on  blocker and LA nitrate 4. Severe OSA: Continue CPAP qhs  5. Pulmonary hypertension: He had pulmonary venous hypertension based on RHC.  6. Smoldering myeloma:  No plans for chemotherapy treatment for now.    F/u w/ Dr. Aundra Dubin 2-3 months, or sooner if needed.    Signed, Lyda Jester, PA-C  09/18/2019  [facility] [place] [address] [phone] (office) [phone] (fax)

## 2019-09-19 ENCOUNTER — Telehealth (HOSPITAL_COMMUNITY): Payer: Self-pay | Admitting: *Deleted

## 2019-09-19 NOTE — Telephone Encounter (Signed)
Pt called stating he can't tolerate the increase in his bp meds his bp has dropped all day readings 90s/high 40s and he's been dizzy. Last bp reading was 99/46.  Patient said he was decreasing his hyrdalazine to lower dose until he hears back from provider.   Hydralazine increased yesterday to 100mg  tid during office visit with Ann Maki  Routed to [place] for advice

## 2019-09-23 NOTE — Telephone Encounter (Signed)
Called pt no answer- no vm set up  

## 2019-09-24 DIAGNOSIS — C61 Malignant neoplasm of prostate: Secondary | ICD-10-CM | POA: Diagnosis not present

## 2019-09-24 DIAGNOSIS — H6123 Impacted cerumen, bilateral: Secondary | ICD-10-CM | POA: Diagnosis not present

## 2019-10-01 DIAGNOSIS — R8279 Other abnormal findings on microbiological examination of urine: Secondary | ICD-10-CM | POA: Diagnosis not present

## 2019-10-01 DIAGNOSIS — Z8546 Personal history of malignant neoplasm of prostate: Secondary | ICD-10-CM | POA: Diagnosis not present

## 2019-10-03 ENCOUNTER — Encounter: Payer: Self-pay | Admitting: Podiatry

## 2019-10-03 ENCOUNTER — Ambulatory Visit (INDEPENDENT_AMBULATORY_CARE_PROVIDER_SITE_OTHER): Payer: Medicare Other | Admitting: Podiatry

## 2019-10-03 ENCOUNTER — Other Ambulatory Visit: Payer: Self-pay

## 2019-10-03 DIAGNOSIS — B351 Tinea unguium: Secondary | ICD-10-CM

## 2019-10-03 DIAGNOSIS — N183 Chronic kidney disease, stage 3 unspecified: Secondary | ICD-10-CM

## 2019-10-03 DIAGNOSIS — M79674 Pain in right toe(s): Secondary | ICD-10-CM | POA: Diagnosis not present

## 2019-10-03 DIAGNOSIS — M79675 Pain in left toe(s): Secondary | ICD-10-CM

## 2019-10-03 DIAGNOSIS — E1142 Type 2 diabetes mellitus with diabetic polyneuropathy: Secondary | ICD-10-CM

## 2019-10-03 NOTE — Progress Notes (Signed)
This patient returns to my office for at risk foot care.  This patient requires this care by a professional since this patient will be at risk due to having  Diabetes and chronic kidney disease.   This patient is unable to cut nails himself  since the patient cannot reach his  nails.These nails are painful walking and wearing shoes.  This patient presents for at risk foot care today.  Patient states he is having symptoms in his right foot/ankle.  General Appearance  Alert, conversant and in no acute stress.  Vascular  Dorsalis pedis and posterior tibial  pulses are palpable  bilaterally.  Capillary return is within normal limits  bilaterally. Temperature is within normal limits  bilaterally.  Neurologic  Senn-Weinstein monofilament wire test within normal limits  bilaterally. Muscle power within normal limits bilaterally.  Nails Thick disfigured discolored nails with subungual debris  from hallux to fifth toes bilaterally. No evidence of bacterial infection or drainage bilaterally.  Orthopedic  No limitations of motion  feet .  No crepitus or effusions noted.  No bony pathology or digital deformities noted.  Arthritis right foot/ankle.  Skin  normotropic skin with no porokeratosis noted bilaterally.  No signs of infections or ulcers noted.   Asymptomatic porokeratosis sub 5th  B/L.  Onychomycosis  Pain in right toes  Pain in left toes  Consent was obtained for treatment procedures.   Mechanical debridement of nails 1-5  bilaterally performed with a nail nipper.  Filed with dremel without incident. No infection or ulcer.  Refer this patient to Dr. Jacqualyn Posey for evaluation.   Return office visit          Told patient to return for periodic foot care and evaluation due to potential at risk complications.   Gardiner Barefoot DPM

## 2019-10-08 ENCOUNTER — Telehealth (HOSPITAL_COMMUNITY): Payer: Self-pay | Admitting: Vascular Surgery

## 2019-10-08 NOTE — Telephone Encounter (Signed)
Left pt vm that appt will be changed, asked pt to call back to reschedule upcoming appt in Aug to 7/29

## 2019-11-20 ENCOUNTER — Encounter (HOSPITAL_COMMUNITY): Payer: Medicare Other

## 2019-11-21 ENCOUNTER — Other Ambulatory Visit: Payer: Self-pay

## 2019-11-21 ENCOUNTER — Encounter (HOSPITAL_COMMUNITY): Payer: Self-pay | Admitting: Cardiology

## 2019-11-21 ENCOUNTER — Ambulatory Visit (HOSPITAL_COMMUNITY)
Admission: RE | Admit: 2019-11-21 | Discharge: 2019-11-21 | Disposition: A | Discharge status: Home / Self Care | Payer: Medicare Other | Source: Ambulatory Visit | Attending: Cardiology | Admitting: Cardiology

## 2019-11-21 VITALS — BP 134/84 | HR 61 | Wt 223.8 lb

## 2019-11-21 DIAGNOSIS — Z882 Allergy status to sulfonamides status: Secondary | ICD-10-CM | POA: Diagnosis not present

## 2019-11-21 DIAGNOSIS — I4510 Unspecified right bundle-branch block: Secondary | ICD-10-CM | POA: Insufficient documentation

## 2019-11-21 DIAGNOSIS — I428 Other cardiomyopathies: Secondary | ICD-10-CM | POA: Insufficient documentation

## 2019-11-21 DIAGNOSIS — E1122 Type 2 diabetes mellitus with diabetic chronic kidney disease: Secondary | ICD-10-CM | POA: Insufficient documentation

## 2019-11-21 DIAGNOSIS — C9000 Multiple myeloma not having achieved remission: Secondary | ICD-10-CM | POA: Insufficient documentation

## 2019-11-21 DIAGNOSIS — N183 Chronic kidney disease, stage 3 unspecified: Secondary | ICD-10-CM | POA: Insufficient documentation

## 2019-11-21 DIAGNOSIS — Z9989 Dependence on other enabling machines and devices: Secondary | ICD-10-CM | POA: Diagnosis not present

## 2019-11-21 DIAGNOSIS — Z881 Allergy status to other antibiotic agents status: Secondary | ICD-10-CM | POA: Diagnosis not present

## 2019-11-21 DIAGNOSIS — Z79899 Other long term (current) drug therapy: Secondary | ICD-10-CM | POA: Insufficient documentation

## 2019-11-21 DIAGNOSIS — Z7984 Long term (current) use of oral hypoglycemic drugs: Secondary | ICD-10-CM | POA: Insufficient documentation

## 2019-11-21 DIAGNOSIS — E785 Hyperlipidemia, unspecified: Secondary | ICD-10-CM | POA: Diagnosis not present

## 2019-11-21 DIAGNOSIS — I130 Hypertensive heart and chronic kidney disease with heart failure and stage 1 through stage 4 chronic kidney disease, or unspecified chronic kidney disease: Secondary | ICD-10-CM | POA: Insufficient documentation

## 2019-11-21 DIAGNOSIS — Z8249 Family history of ischemic heart disease and other diseases of the circulatory system: Secondary | ICD-10-CM | POA: Diagnosis not present

## 2019-11-21 DIAGNOSIS — Z923 Personal history of irradiation: Secondary | ICD-10-CM | POA: Insufficient documentation

## 2019-11-21 DIAGNOSIS — I2510 Atherosclerotic heart disease of native coronary artery without angina pectoris: Secondary | ICD-10-CM | POA: Diagnosis not present

## 2019-11-21 DIAGNOSIS — G4733 Obstructive sleep apnea (adult) (pediatric): Secondary | ICD-10-CM | POA: Diagnosis not present

## 2019-11-21 DIAGNOSIS — Z87891 Personal history of nicotine dependence: Secondary | ICD-10-CM | POA: Diagnosis not present

## 2019-11-21 DIAGNOSIS — Z885 Allergy status to narcotic agent status: Secondary | ICD-10-CM | POA: Insufficient documentation

## 2019-11-21 DIAGNOSIS — Z7982 Long term (current) use of aspirin: Secondary | ICD-10-CM | POA: Insufficient documentation

## 2019-11-21 DIAGNOSIS — Z9861 Coronary angioplasty status: Secondary | ICD-10-CM | POA: Insufficient documentation

## 2019-11-21 DIAGNOSIS — I272 Pulmonary hypertension, unspecified: Secondary | ICD-10-CM | POA: Diagnosis not present

## 2019-11-21 DIAGNOSIS — Z888 Allergy status to other drugs, medicaments and biological substances status: Secondary | ICD-10-CM | POA: Insufficient documentation

## 2019-11-21 DIAGNOSIS — Z8546 Personal history of malignant neoplasm of prostate: Secondary | ICD-10-CM | POA: Diagnosis not present

## 2019-11-21 DIAGNOSIS — I5022 Chronic systolic (congestive) heart failure: Secondary | ICD-10-CM | POA: Insufficient documentation

## 2019-11-21 LAB — BASIC METABOLIC PANEL
Anion gap: 10 (ref 5–15)
BUN: 32 mg/dL — ABNORMAL HIGH (ref 8–23)
CO2: 26 mmol/L (ref 22–32)
Calcium: 9 mg/dL (ref 8.9–10.3)
Chloride: 103 mmol/L (ref 98–111)
Creatinine, Ser: 2.33 mg/dL — ABNORMAL HIGH (ref 0.61–1.24)
GFR calc Af Amer: 29 mL/min — ABNORMAL LOW (ref >60)
GFR calc non Af Amer: 25 mL/min — ABNORMAL LOW (ref >60)
Glucose, Bld: 196 mg/dL — ABNORMAL HIGH (ref 70–99)
Potassium: 3.6 mmol/L (ref 3.5–5.1)
Sodium: 139 mmol/L (ref 135–145)

## 2019-11-21 MED ORDER — CARVEDILOL 12.5 MG PO TABS: 12.5000 mg | ORAL_TABLET | Freq: Two times a day (BID) | ORAL | 6 refills | Status: DC

## 2019-11-21 MED ORDER — HYDRALAZINE HCL 50 MG PO TABS: 50.0000 mg | ORAL_TABLET | Freq: Three times a day (TID) | ORAL | 3 refills | Status: DC

## 2019-11-21 NOTE — Patient Instructions (Signed)
DECREASE Carvedilol to 12.5 mg, one tab twice daily DECREASE Hydralazine to 50 mg, one tab three times daily  Labs today We will only contact you if something comes back abnormal or we need to make some changes. Otherwise no news is good news!  Your physician has requested that you regularly monitor and record your blood pressure readings at home. Please use the same machine at the same time of day to check your readings and record them to bring to your follow-up visit.  Your physician recommends that you schedule a follow-up appointment in: 3 months with Dr Aundra Dubin and echo  Your physician has requested that you have an echocardiogram. Echocardiography is a painless test that uses sound waves to create images of your heart. It provides your doctor with information about the size and shape of your heart and how well your heart's chambers and valves are working. This procedure takes approximately one hour. There are no restrictions for this procedure.  If you have any questions or concerns before your next appointment please send [place] a message through [place] or call our office at [phone].    TO LEAVE A MESSAGE FOR THE NURSE SELECT OPTION 2, PLEASE LEAVE A MESSAGE INCLUDING: . YOUR NAME . DATE OF BIRTH . CALL BACK NUMBER . REASON FOR CALL**this is important as we prioritize the call backs  YOU WILL RECEIVE A CALL BACK THE SAME DAY AS LONG AS YOU CALL BEFORE 4:00 PM

## 2019-11-21 NOTE — Progress Notes (Signed)
Date:  11/21/2019   ID:  Dravin Lance, DOB 10-30-1935, MRN [id number]    Provider location: [facility] Type of Visit: Established patient   PCP:  [name], MD  Cardiologist:  [name], MD HF Cardiology: Dr Aundra Dubin   History of Present Illness: Allen Dyer is a 84 y.o. male with a history of DM, rheumatic fever, HTN, hyperlipidemia, CKD 3, prostate CA s/p XRT, systolic HF due to NICM (EF 30-35%) 05/2018,pulmonary venous HTN, and mild nonobstructive CAD.  Admitted 3/11-3/18/20 with volume overload. Diuresed with IV lasix. HF team consulted with concern for pulmonary HTN due to ongoing oxygen need. RHC completed and showed moderate pulmonary venousHTN with normal PVR 1.7 (no PAH). Transitioned from Lasix to torsemide 40 mg BID at DC. Oxygen weaned off. HF medications optimized as able. Limited by CKD. DC weight 213 lbs.   Patient was diagnosed with smoldering myeloma in 7/20, no treatment planned at this time.   Cardiolite in 2/21 showed EF 36%, fixed anteroapical defect, no ischemia.   He returns for followup of CHF.  He is using CPAP.  No chest pain.  No exertional dyspnea.  No palpitations.  No orthopnea/PND.  Main complaint recently has been low BP at times.  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  He got lightheaded, nurse checked BP and found it to be 86/50.  He got lightheaded again on Tuesday, SBP in 80s when he checked.  His home SBP readings have ranged from 80s-130s recently.  BP is good today. No syncope, no falls.  Weight is stable. No palpitations.   ECG (personally reviewed): NSR, RBBB, LAFB (no changes)  Labs (7/20): creatinine 2.29 => 1.97, K 3.5, hgb 11.6.  Labs (10/20): Creatinine 2.0 Labs (11/20): creatinine 2.5 Labs (1/21): K 3.7, creatinine 2.28 Labs (5/21): K 4, creatinine 2.33   PMH: 1. CKD: Stage 3.  2. Hyperlipidemia: Statin intolerant.  3. HTN 4. Multiple myeloma versus  smoldering myeloma: Diagnosed in 7/20.  - Bone marrow biopsy stained negative for amyloidosis.  5. Prostate cancer s/p XRT.  6. Type 2 diabetes.  7. Chronic systolic CHF: Nonischemic cardiomyopathy.  - RHC/LHC (2/20): Nonobstructive CAD with 50% pLAD; mean RA 13, PA 72/27 mean 47, mean PCWP 29, CI 2.8, PVR 3 WU - Echo (2/20): EF 30-35%.  - RHC (3/20): mean RA 9, PA 55/20 mean 32, mean PCWP 20, CI 3.29, PVR 1.7 WU - Echo (7/20): EF 25-30%, moderate LV dilation without LVH, normal RV size and systolic function, mild-moderate AI.  - Cardiolite (2/21): EF 36%, fixed anteroapical defect, no ischemia.  8. Pulmonary venous hypertension.  - V/Q scan (3/20): No evidence for chronic PE.  9. OSA: Using CPAP.   Past Surgical History:  Procedure Laterality Date  . RIGHT HEART CATH N/A 07/10/2018   Procedure: RIGHT HEART CATH;  Surgeon: Larey Dresser, MD;  Location: [facility];  Service: Cardiovascular;  Laterality: N/A;  . RIGHT/LEFT HEART CATH AND CORONARY ANGIOGRAPHY N/A 06/18/2018   Procedure: RIGHT/LEFT HEART CATH AND CORONARY ANGIOGRAPHY;  Surgeon: Troy Sine, MD;  Location: [facility];  Service: Cardiovascular;  Laterality: N/A;     Current Outpatient Medications  Medication Sig Dispense Refill  . aspirin EC 81 MG tablet Take 81 mg by mouth every evening.     . carvedilol (COREG) 12.5 MG tablet Take 1 tablet (12.5 mg total) by mouth 2 (  two) times daily. 60 tablet 6  . Cholecalciferol (VITAMIN D3 PO) Take 1 tablet by mouth daily.     . cyanocobalamin 100 MCG tablet Take 100 mcg by mouth daily.     Marland Kitchen gabapentin (NEURONTIN) 800 MG tablet Take 800 mg by mouth 2 (two) times daily.     Marland Kitchen glipiZIDE (GLUCOTROL) 10 MG tablet Take 10 mg by mouth 2 (two) times daily.     . hydrALAZINE (APRESOLINE) 50 MG tablet Take 1 tablet (50 mg total) by mouth 3 (three) times daily. 90 tablet 3  . isosorbide mononitrate (IMDUR) 60 MG 24 hr tablet Take 1.5 tablets (90 mg total) by mouth daily.  45 tablet 3  . Multiple Vitamin (MULTIVITAMIN WITH MINERALS) TABS tablet Take 1 tablet by mouth 2 (two) times daily.    Marland Kitchen PRECISION XTRA TEST STRIPS test strip     . spironolactone (ALDACTONE) 25 MG tablet Take 1 tablet (25 mg total) by mouth at bedtime. 30 tablet 11  . tadalafil (CIALIS) 5 MG tablet Take 5 mg by mouth every 3 (three) days.    . tamsulosin (FLOMAX) 0.4 MG CAPS capsule Take 0.4 mg by mouth 2 (two) times daily.     Marland Kitchen torsemide (DEMADEX) 20 MG tablet Take 2 tablets (40 mg total) by mouth 2 (two) times daily. 360 tablet 1   No current facility-administered medications for this encounter.    Allergies:   Tramadol, Finasteride, Lisinopril, Ciprofloxacin, Simvastatin, and Sulfa antibiotics   Social History:  The patient  reports that he has quit smoking. He has never used smokeless tobacco. He reports that he does not drink alcohol and does not use drugs.   Family History:  The patient's family history includes Cancer in his mother and sister; Congestive Heart Failure in his brother and father.   ROS:  Please see the history of present illness.   All other systems are personally reviewed and negative.  Vitals:   11/21/19 0905  BP: (!) 134/84  Pulse: 61  SpO2: 95%    Exam:   BP (!) 134/84   Pulse 61   Wt (!) 101.5 kg (223 lb 12.8 oz)   SpO2 95%   BMI 35.05 kg/m  General: NAD Neck: No JVD, no thyromegaly or thyroid nodule.  Lungs: Clear to auscultation bilaterally with normal respiratory effort. CV: Nondisplaced PMI.  Heart regular S1/S2, no S3/S4, no murmur.  No peripheral edema.  No carotid bruit.  Normal pedal pulses.  Abdomen: Soft, nontender, no hepatosplenomegaly, no distention.  Skin: Intact without lesions or rashes.  Neurologic: Alert and oriented x 3.  Psych: Normal affect. Extremities: No clubbing or cyanosis.  HEENT: Normal.   Recent Labs: 02/27/2019: ALT 13; Hemoglobin 10.8; Platelets 183 09/18/2019: B Natriuretic Peptide 139.0 11/21/2019: BUN 32;  Creatinine, Ser 2.33; Potassium 3.6; Sodium 139  Personally reviewed   Wt Readings from Last 3 Encounters:  11/21/19 (!) 101.5 kg (223 lb 12.8 oz)  09/18/19 100.8 kg (222 lb 3.2 oz)  08/21/19 101.1 kg (222 lb 12.8 oz)     ASSESSMENT AND PLAN:  1. Chronic systolic HF:  New diagnosis 05/2018, EF 30-35% by echo.Due to NICM. LHC 05/2018 with mild non-osbtructive CAD (50% proximal LAD stenosis).Has had high BP for a long time, but it has generally been controlled recently. Had palpitations in past, but very few PVCs on telemetry when in the hospital. Prior hx of ETOH abuse, but none in 40+ years. Did not receive chemotherapy with prior prostate cancer. QRS is  wide on EKG, but it is RBBB. He has multiple myeloma but he does not have ventricular wall thickening that would be suggestive of cardiac amyloidosis.  [place] 3/20 with mildly elevated filling pressures, CO preserved, pulmonary venous HTN. No PAH. Echo in 7/20 showed that EF remains low at 25-30% with moderate LV dilation, no LVH, and normal RV.  NYHA class II symptoms.  He is not volume overloaded on exam.  BP runs low at times.   - Continue torsemide 40 mg bid, BMET today.    - No ACE/ARB/ARNI with CKD stage 3.  - With symptomatic hypotension at times, decrease Coreg to 12.5 mg bid and hydralazine to 50 mg tid.  Continue Imdur 90 mg daily.  -Continuespironolactone 25 mg qhs. - With advanced age and nonischemic etiology of cardiomyopathy, would avoid ICD.  He has RBBB so would not likely benefit from CRT.  - Repeat echo at 3 month followup.  2. CKD Stage 3: Creatinine stable at 2.33 most recently.  - He has been referred to nephrology.  3. CAD: 50% pLAD on LHC in 2/20. Cardiolite in 2/21 showed no ischemia.   - He has not been able to tolerate statins but LDL has been low.  -ContinueASA 81 mg daily.  4. Severe OSA: Continue CPAP.  5. Pulmonary hypertension: He had pulmonary venous hypertension based on RHC.  6. Smoldering myeloma:  No  plans for chemotherapy treatment for now.    Followup with me in 3 months with echo.        Signed, Loralie Champagne, MD  11/21/2019  [facility] [address] [facility] [place] [address] [phone] (office) [phone] (fax)

## 2019-11-25 ENCOUNTER — Ambulatory Visit (INDEPENDENT_AMBULATORY_CARE_PROVIDER_SITE_OTHER): Payer: Medicare Other | Admitting: Podiatry

## 2019-11-25 ENCOUNTER — Other Ambulatory Visit: Payer: Self-pay

## 2019-11-25 ENCOUNTER — Ambulatory Visit (INDEPENDENT_AMBULATORY_CARE_PROVIDER_SITE_OTHER): Payer: Medicare Other

## 2019-11-25 DIAGNOSIS — M779 Enthesopathy, unspecified: Secondary | ICD-10-CM

## 2019-11-25 DIAGNOSIS — E119 Type 2 diabetes mellitus without complications: Secondary | ICD-10-CM | POA: Diagnosis not present

## 2019-11-25 DIAGNOSIS — Z7984 Long term (current) use of oral hypoglycemic drugs: Secondary | ICD-10-CM | POA: Diagnosis not present

## 2019-11-25 DIAGNOSIS — M1990 Unspecified osteoarthritis, unspecified site: Secondary | ICD-10-CM | POA: Diagnosis not present

## 2019-11-25 DIAGNOSIS — M79671 Pain in right foot: Secondary | ICD-10-CM | POA: Diagnosis not present

## 2019-11-25 DIAGNOSIS — M19079 Primary osteoarthritis, unspecified ankle and foot: Secondary | ICD-10-CM

## 2019-11-25 DIAGNOSIS — E7800 Pure hypercholesterolemia, unspecified: Secondary | ICD-10-CM | POA: Diagnosis not present

## 2019-11-25 DIAGNOSIS — Z79899 Other long term (current) drug therapy: Secondary | ICD-10-CM | POA: Diagnosis not present

## 2019-11-27 ENCOUNTER — Encounter (HOSPITAL_COMMUNITY): Payer: Medicare Other

## 2019-11-28 NOTE — Progress Notes (Signed)
Subjective: 84 year old male presents the office today for concerns of ankle pain to his right ankle.  This started after injury in 1973 knees have surgery.  The ankle particular.  At rest he states also as long as he is staying active he does not have pain 5 days and he has not active as well it seems to hurt more. Denies any systemic complaints such as fevers, chills, nausea, vomiting. No acute changes since last appointment, and no other complaints at this time.   Objective: AAO x3, NAD DP/PT pulses palpable bilaterally, CRT less than 3 seconds Degrees range of motion of right ankle joint there is localized swelling but there is no erythema or warmth.  There is no area pinpoint tenderness.  No pain on the Achilles tendon, plantar fascia today.  Flexor, extensor tendons appear to be intact No pain with calf compression, swelling, warmth, erythema  Assessment: Severe right ankle arthritis  Plan: -All treatment options discussed with the patient including all alternatives, risks, complications.  -X-rays obtained reviewed.  There is severe arthritic changes present of the ankle. -We discussed the conservative as well as surgical options.  After long discussion regards to his options as well as using activities not have significant discomfort.  Due to this muscle and upon bracing.  I offered steroid injection today and will consider this in the future. -Patient encouraged to call the office with any questions, concerns, change in symptoms.   Trula Slade DPM

## 2019-12-02 ENCOUNTER — Other Ambulatory Visit: Payer: Self-pay

## 2019-12-02 ENCOUNTER — Inpatient Hospital Stay: Payer: Medicare Other | Attending: Hematology and Oncology

## 2019-12-02 DIAGNOSIS — D61818 Other pancytopenia: Secondary | ICD-10-CM | POA: Diagnosis not present

## 2019-12-02 DIAGNOSIS — C9000 Multiple myeloma not having achieved remission: Secondary | ICD-10-CM | POA: Diagnosis not present

## 2019-12-02 DIAGNOSIS — D539 Nutritional anemia, unspecified: Secondary | ICD-10-CM | POA: Insufficient documentation

## 2019-12-02 DIAGNOSIS — N183 Chronic kidney disease, stage 3 unspecified: Secondary | ICD-10-CM | POA: Insufficient documentation

## 2019-12-02 DIAGNOSIS — Z79899 Other long term (current) drug therapy: Secondary | ICD-10-CM | POA: Diagnosis not present

## 2019-12-02 DIAGNOSIS — E1122 Type 2 diabetes mellitus with diabetic chronic kidney disease: Secondary | ICD-10-CM | POA: Insufficient documentation

## 2019-12-02 DIAGNOSIS — Z7984 Long term (current) use of oral hypoglycemic drugs: Secondary | ICD-10-CM | POA: Insufficient documentation

## 2019-12-02 LAB — CBC WITH DIFFERENTIAL/PLATELET
Abs Immature Granulocytes: 0.01 10*3/uL (ref 0.00–0.07)
Basophils Absolute: 0 10*3/uL (ref 0.0–0.1)
Basophils Relative: 0 %
Eosinophils Absolute: 0.3 10*3/uL (ref 0.0–0.5)
Eosinophils Relative: 6 %
HCT: 33.6 % — ABNORMAL LOW (ref 39.0–52.0)
Hemoglobin: 10.8 g/dL — ABNORMAL LOW (ref 13.0–17.0)
Immature Granulocytes: 0 %
Lymphocytes Relative: 25 %
Lymphs Abs: 1.1 10*3/uL (ref 0.7–4.0)
MCH: 32.7 pg (ref 26.0–34.0)
MCHC: 32.1 g/dL (ref 30.0–36.0)
MCV: 101.8 fL — ABNORMAL HIGH (ref 80.0–100.0)
Monocytes Absolute: 0.5 10*3/uL (ref 0.1–1.0)
Monocytes Relative: 12 %
Neutro Abs: 2.5 10*3/uL (ref 1.7–7.7)
Neutrophils Relative %: 57 %
Platelets: 130 10*3/uL — ABNORMAL LOW (ref 150–400)
RBC: 3.3 MIL/uL — ABNORMAL LOW (ref 4.22–5.81)
RDW: 13.9 % (ref 11.5–15.5)
WBC: 4.4 10*3/uL (ref 4.0–10.5)
nRBC: 0 % (ref 0.0–0.2)

## 2019-12-02 LAB — COMPREHENSIVE METABOLIC PANEL
ALT: 13 U/L (ref 0–44)
AST: 16 U/L (ref 15–41)
Albumin: 3.5 g/dL (ref 3.5–5.0)
Alkaline Phosphatase: 67 U/L (ref 38–126)
Anion gap: 8 (ref 5–15)
BUN: 32 mg/dL — ABNORMAL HIGH (ref 8–23)
CO2: 30 mmol/L (ref 22–32)
Calcium: 10.2 mg/dL (ref 8.9–10.3)
Chloride: 104 mmol/L (ref 98–111)
Creatinine, Ser: 2.54 mg/dL — ABNORMAL HIGH (ref 0.61–1.24)
GFR calc Af Amer: 26 mL/min — ABNORMAL LOW (ref >60)
GFR calc non Af Amer: 23 mL/min — ABNORMAL LOW (ref >60)
Glucose, Bld: 155 mg/dL — ABNORMAL HIGH (ref 70–99)
Potassium: 3.9 mmol/L (ref 3.5–5.1)
Sodium: 142 mmol/L (ref 135–145)
Total Bilirubin: 0.4 mg/dL (ref 0.3–1.2)
Total Protein: 7.1 g/dL (ref 6.5–8.1)

## 2019-12-04 ENCOUNTER — Telehealth: Payer: Self-pay | Admitting: Cardiology

## 2019-12-04 LAB — KAPPA/LAMBDA LIGHT CHAINS
Kappa free light chain: 130.3 mg/L — ABNORMAL HIGH (ref 3.3–19.4)
Kappa, lambda light chain ratio: 5.69 — ABNORMAL HIGH (ref 0.26–1.65)
Lambda free light chains: 22.9 mg/L (ref 5.7–26.3)

## 2019-12-04 NOTE — Telephone Encounter (Signed)
Patient is requesting to schedule sleep study with Dr. Radford Pax.

## 2019-12-04 NOTE — Telephone Encounter (Signed)
Spoke with the patient who was calling to schedule his one year FU in Dr. Theodosia Blender sleep clinic. Patient has been scheduled for 08/30.

## 2019-12-05 LAB — MULTIPLE MYELOMA PANEL, SERUM
Albumin SerPl Elph-Mcnc: 3.3 g/dL (ref 2.9–4.4)
Albumin/Glob SerPl: 1.1 (ref 0.7–1.7)
Alpha 1: 0.2 g/dL (ref 0.0–0.4)
Alpha2 Glob SerPl Elph-Mcnc: 0.6 g/dL (ref 0.4–1.0)
B-Globulin SerPl Elph-Mcnc: 1.3 g/dL (ref 0.7–1.3)
Gamma Glob SerPl Elph-Mcnc: 1.1 g/dL (ref 0.4–1.8)
Globulin, Total: 3.2 g/dL (ref 2.2–3.9)
IgA: 665 mg/dL — ABNORMAL HIGH (ref 61–437)
IgG (Immunoglobin G), Serum: 1130 mg/dL (ref 603–1613)
IgM (Immunoglobulin M), Srm: 27 mg/dL (ref 15–143)
M Protein SerPl Elph-Mcnc: 0.6 g/dL — ABNORMAL HIGH
Total Protein ELP: 6.5 g/dL (ref 6.0–8.5)

## 2019-12-09 ENCOUNTER — Other Ambulatory Visit: Payer: Self-pay

## 2019-12-09 ENCOUNTER — Inpatient Hospital Stay (HOSPITAL_BASED_OUTPATIENT_CLINIC_OR_DEPARTMENT_OTHER): Payer: Medicare Other | Admitting: Hematology and Oncology

## 2019-12-09 ENCOUNTER — Telehealth: Payer: Self-pay | Admitting: Hematology and Oncology

## 2019-12-09 ENCOUNTER — Encounter: Payer: Self-pay | Admitting: Hematology and Oncology

## 2019-12-09 DIAGNOSIS — E1122 Type 2 diabetes mellitus with diabetic chronic kidney disease: Secondary | ICD-10-CM | POA: Diagnosis not present

## 2019-12-09 DIAGNOSIS — D61818 Other pancytopenia: Secondary | ICD-10-CM | POA: Diagnosis not present

## 2019-12-09 DIAGNOSIS — N183 Chronic kidney disease, stage 3 unspecified: Secondary | ICD-10-CM

## 2019-12-09 DIAGNOSIS — D539 Nutritional anemia, unspecified: Secondary | ICD-10-CM | POA: Insufficient documentation

## 2019-12-09 DIAGNOSIS — Z7984 Long term (current) use of oral hypoglycemic drugs: Secondary | ICD-10-CM | POA: Diagnosis not present

## 2019-12-09 DIAGNOSIS — C9000 Multiple myeloma not having achieved remission: Secondary | ICD-10-CM | POA: Diagnosis not present

## 2019-12-09 NOTE — Assessment & Plan Note (Signed)
I reviewed his test result with the patient  Based on his blood work, the patient have MGUS The patient was recently diagnosed with congestive heart failure and has longstanding history of diabetes that both can contribute to elevated creatinine He has no signs of cancer progression For now, the patient has no symptoms I will see him again in a few months for further follow-up We discussed the importance of influenza vaccination

## 2019-12-09 NOTE — Assessment & Plan Note (Signed)
He has mild intermittent acute on chronic renal failure This is likely the contributing factor to the elevated light chain We discussed the importance of getting his risk factors under control I also recommend the patient to hydrate adequately before each visit

## 2019-12-09 NOTE — Assessment & Plan Note (Signed)
He has mild pancytopenia on and off I plan to check iron studies and B12 in his next visit He is not symptomatic Observe for now

## 2019-12-09 NOTE — Telephone Encounter (Signed)
Scheduled appt per 8/17 sch msg. Gave pt a print out of AVS.

## 2019-12-09 NOTE — Progress Notes (Signed)
[place] OFFICE PROGRESS NOTE  Patient Care Team: Koirala, Dibas, MD as PCP - General (Family Medicine) [name], MD as PCP - Cardiology (Cardiology)  ASSESSMENT & PLAN:  Multiple myeloma [facility]) I reviewed his test result with the patient  Based on his blood work, the patient have MGUS The patient was recently diagnosed with congestive heart failure and has longstanding history of diabetes that both can contribute to elevated creatinine He has no signs of cancer progression For now, the patient has no symptoms I will see him again in a few months for further follow-up We discussed the importance of influenza vaccination  CKD (chronic kidney disease), stage III ([place]) He has mild intermittent acute on chronic renal failure This is likely the contributing factor to the elevated light chain We discussed the importance of getting his risk factors under control I also recommend the patient to hydrate adequately before each visit  Deficiency anemia He has mild pancytopenia on and off I plan to check iron studies and B12 in his next visit He is not symptomatic Observe for now   Orders Placed This Encounter  Procedures  . Comprehensive metabolic panel    Standing Status:   Standing    Number of Occurrences:   33    Standing Expiration Date:   12/08/2020  . CBC with Differential/Platelet    Standing Status:   Standing    Number of Occurrences:   22    Standing Expiration Date:   12/08/2020  . Iron and TIBC    Standing Status:   Future    Standing Expiration Date:   12/08/2020  . Sedimentation rate    Standing Status:   Future    Standing Expiration Date:   12/08/2020  . Vitamin B12    Standing Status:   Future    Standing Expiration Date:   12/08/2020  . Reticulocytes ([place])    Standing Status:   Future    Standing Expiration Date:   12/08/2020  . Ferritin    Standing Status:   Future    Standing Expiration Date:   12/08/2020  . Kappa/lambda light chains     Standing Status:   Standing    Number of Occurrences:   22    Standing Expiration Date:   12/08/2020  . Multiple Myeloma Panel (SPEP&IFE w/QIG)    Standing Status:   Standing    Number of Occurrences:   22    Standing Expiration Date:   12/08/2020    All questions were answered. The patient knows to call the clinic with any problems, questions or concerns. The total time spent in the appointment was 20 minutes encounter with patients including review of chart and various tests results, discussions about plan of care and coordination of care plan   Heath Lark, MD 12/09/2019 1:04 PM  INTERVAL HISTORY: Please see below for problem oriented charting. Returns for further follow-up for MGUS He is doing well Denies recent infection, fever or chills He has gained some weight due to poor dietary choices No new bone pain  SUMMARY OF ONCOLOGIC HISTORY: Oncology History  Multiple myeloma ([place])  10/17/2018 Imaging   1. Interim clearing of congestive heart failure. Persistent cardiomegaly.   2.  No evidence of metastatic disease or myeloma   11/04/2018 Bone Marrow Biopsy   Bone Marrow, Aspirate,Biopsy, and Clot, right ilium - PLASMA CELL NEOPLASM, SEE COMMENT. - ATYPICAL MEGAKARYOCYTES. PERIPHERAL BLOOD: - NORMOCYTIC ANEMIA. Diagnosis Note The marrow is mildly hypercellular  for age with increased plasma cells (15% by CD138 immunohistochemistry). These findings are consistent with a plasma cell neoplasm. Correlation with clinical, radiographic, and laboratory data is required for further classification. There are a few atypical megakaryocytes the significance of which is unclear.  Del 13q noted but normal cytogenetics [place] red stain is negative for amyloid     REVIEW OF SYSTEMS:   Constitutional: Denies fevers, chills or abnormal weight loss Eyes: Denies blurriness of vision Ears, nose, mouth, throat, and face: Denies mucositis or sore throat Respiratory: Denies cough, dyspnea or  wheezes Cardiovascular: Denies palpitation, chest discomfort or lower extremity swelling Gastrointestinal:  Denies nausea, heartburn or change in bowel habits Skin: Denies abnormal skin rashes Lymphatics: Denies new lymphadenopathy or easy bruising Neurological:Denies numbness, tingling or new weaknesses Behavioral/Psych: Mood is stable, no new changes  All other systems were reviewed with the patient and are negative.  I have reviewed the past medical history, past surgical history, social history and family history with the patient and they are unchanged from previous note.  ALLERGIES:  is allergic to tramadol, finasteride, lisinopril, ciprofloxacin, simvastatin, and sulfa antibiotics.  MEDICATIONS:  Current Outpatient Medications  Medication Sig Dispense Refill  . aspirin EC 81 MG tablet Take 81 mg by mouth every evening.     . carvedilol (COREG) 12.5 MG tablet Take 1 tablet (12.5 mg total) by mouth 2 (two) times daily. 60 tablet 6  . Cholecalciferol (VITAMIN D3 PO) Take 1 tablet by mouth daily.     . cyanocobalamin 100 MCG tablet Take 100 mcg by mouth daily.     Marland Kitchen gabapentin (NEURONTIN) 800 MG tablet Take 800 mg by mouth 2 (two) times daily.     Marland Kitchen glipiZIDE (GLUCOTROL) 10 MG tablet Take 10 mg by mouth 2 (two) times daily.     . hydrALAZINE (APRESOLINE) 50 MG tablet Take 1 tablet (50 mg total) by mouth 3 (three) times daily. 90 tablet 3  . isosorbide mononitrate (IMDUR) 60 MG 24 hr tablet Take 1.5 tablets (90 mg total) by mouth daily. 45 tablet 3  . Multiple Vitamin (MULTIVITAMIN WITH MINERALS) TABS tablet Take 1 tablet by mouth 2 (two) times daily.    Marland Kitchen PRECISION XTRA TEST STRIPS test strip     . spironolactone (ALDACTONE) 25 MG tablet Take 1 tablet (25 mg total) by mouth at bedtime. 30 tablet 11  . tadalafil (CIALIS) 5 MG tablet Take 5 mg by mouth every 3 (three) days.    . tamsulosin (FLOMAX) 0.4 MG CAPS capsule Take 0.4 mg by mouth 2 (two) times daily.     Marland Kitchen torsemide (DEMADEX) 20  MG tablet Take 2 tablets (40 mg total) by mouth 2 (two) times daily. 360 tablet 1   No current facility-administered medications for this visit.    PHYSICAL EXAMINATION: ECOG PERFORMANCE STATUS: 1 - Symptomatic but completely ambulatory  Vitals:   12/09/19 1047  BP: (!) 148/74  Pulse: (!) 51  Resp: 18  Temp: 98.5 F (36.9 C)  SpO2: 98%   Filed Weights   12/09/19 1047  Weight: 226 lb 3.2 oz (102.6 kg)    GENERAL:alert, no distress and comfortable NEURO: alert & oriented x 3 with fluent speech, no focal motor/sensory deficits  LABORATORY DATA:  I have reviewed the data as listed    Component Value Date/Time   NA 142 12/02/2019 1108   NA 143 07/01/2018 1233   K 3.9 12/02/2019 1108   CL 104 12/02/2019 1108   CO2 30 12/02/2019 1108  GLUCOSE 155 (H) 12/02/2019 1108   BUN 32 (H) 12/02/2019 1108   BUN 27 07/01/2018 1233   CREATININE 2.54 (H) 12/02/2019 1108   CREATININE 2.06 (H) 10/10/2018 1404   CALCIUM 10.2 12/02/2019 1108   PROT 7.1 12/02/2019 1108   ALBUMIN 3.5 12/02/2019 1108   AST 16 12/02/2019 1108   AST 20 10/10/2018 1404   ALT 13 12/02/2019 1108   ALT 16 10/10/2018 1404   ALKPHOS 67 12/02/2019 1108   BILITOT 0.4 12/02/2019 1108   BILITOT 0.4 10/10/2018 1404   GFRNONAA 23 (L) 12/02/2019 1108   GFRNONAA 29 (L) 10/10/2018 1404   GFRAA 26 (L) 12/02/2019 1108   GFRAA 34 (L) 10/10/2018 1404    Lab Results  Component Value Date   SPEP Comment 10/10/2018    Lab Results  Component Value Date   WBC 4.4 12/02/2019   NEUTROABS 2.5 12/02/2019   HGB 10.8 (L) 12/02/2019   HCT 33.6 (L) 12/02/2019   MCV 101.8 (H) 12/02/2019   PLT 130 (L) 12/02/2019      Chemistry      Component Value Date/Time   NA 142 12/02/2019 1108   NA 143 07/01/2018 1233   K 3.9 12/02/2019 1108   CL 104 12/02/2019 1108   CO2 30 12/02/2019 1108   BUN 32 (H) 12/02/2019 1108   BUN 27 07/01/2018 1233   CREATININE 2.54 (H) 12/02/2019 1108   CREATININE 2.06 (H) 10/10/2018 1404       Component Value Date/Time   CALCIUM 10.2 12/02/2019 1108   ALKPHOS 67 12/02/2019 1108   AST 16 12/02/2019 1108   AST 20 10/10/2018 1404   ALT 13 12/02/2019 1108   ALT 16 10/10/2018 1404   BILITOT 0.4 12/02/2019 1108   BILITOT 0.4 10/10/2018 1404

## 2019-12-12 ENCOUNTER — Encounter: Payer: Self-pay | Admitting: Podiatry

## 2019-12-12 ENCOUNTER — Other Ambulatory Visit: Payer: Self-pay

## 2019-12-12 ENCOUNTER — Ambulatory Visit (INDEPENDENT_AMBULATORY_CARE_PROVIDER_SITE_OTHER): Payer: Medicare Other | Admitting: Podiatry

## 2019-12-12 DIAGNOSIS — M79674 Pain in right toe(s): Secondary | ICD-10-CM | POA: Diagnosis not present

## 2019-12-12 DIAGNOSIS — B351 Tinea unguium: Secondary | ICD-10-CM

## 2019-12-12 DIAGNOSIS — E1159 Type 2 diabetes mellitus with other circulatory complications: Secondary | ICD-10-CM

## 2019-12-12 DIAGNOSIS — M79675 Pain in left toe(s): Secondary | ICD-10-CM

## 2019-12-12 DIAGNOSIS — E1142 Type 2 diabetes mellitus with diabetic polyneuropathy: Secondary | ICD-10-CM

## 2019-12-12 DIAGNOSIS — N183 Chronic kidney disease, stage 3 unspecified: Secondary | ICD-10-CM | POA: Diagnosis not present

## 2019-12-12 NOTE — Progress Notes (Signed)
This patient returns to my office for at risk foot care.  This patient requires this care by a professional since this patient will be at risk due to having  Diabetes and chronic kidney disease.   This patient is unable to cut nails himself  since the patient cannot reach his  nails.These nails are painful walking and wearing shoes.  This patient presents for at risk foot care today.  Patient states he is having symptoms in his right foot/ankle.  General Appearance  Alert, conversant and in no acute stress.  Vascular  Dorsalis pedis and posterior tibial  pulses are palpable  bilaterally.  Capillary return is within normal limits  bilaterally. Temperature is within normal limits  bilaterally.  Neurologic  Senn-Weinstein monofilament wire test within normal limits  bilaterally. Muscle power within normal limits bilaterally.  Nails Thick disfigured discolored nails with subungual debris  from hallux to fifth toes bilaterally. No evidence of bacterial infection or drainage bilaterally.  Orthopedic  No limitations of motion  feet .  No crepitus or effusions noted.  No bony pathology or digital deformities noted.  Arthritis right foot/ankle.  Skin  normotropic skin with no porokeratosis noted bilaterally.  No signs of infections or ulcers noted.   Asymptomatic porokeratosis sub 5th  B/L.  Onychomycosis  Pain in right toes  Pain in left toes  Consent was obtained for treatment procedures.   Mechanical debridement of nails 1-5  bilaterally performed with a nail nipper.  Filed with dremel without incident. No infection or ulcer.     Return office visit   3 months       Told patient to return for periodic foot care and evaluation due to potential at risk complications.   Gardiner Barefoot DPM

## 2019-12-17 ENCOUNTER — Ambulatory Visit (HOSPITAL_COMMUNITY)
Admission: RE | Admit: 2019-12-17 | Discharge: 2019-12-17 | Disposition: A | Discharge status: Home / Self Care | Payer: Medicare Other | Source: Ambulatory Visit | Attending: Internal Medicine | Admitting: Internal Medicine

## 2019-12-17 ENCOUNTER — Other Ambulatory Visit: Payer: Self-pay

## 2019-12-17 ENCOUNTER — Encounter (HOSPITAL_COMMUNITY): Payer: Self-pay

## 2019-12-17 VITALS — BP 152/86 | HR 66 | Wt 227.6 lb

## 2019-12-17 DIAGNOSIS — C9000 Multiple myeloma not having achieved remission: Secondary | ICD-10-CM | POA: Diagnosis not present

## 2019-12-17 DIAGNOSIS — E1122 Type 2 diabetes mellitus with diabetic chronic kidney disease: Secondary | ICD-10-CM | POA: Insufficient documentation

## 2019-12-17 DIAGNOSIS — I4510 Unspecified right bundle-branch block: Secondary | ICD-10-CM | POA: Diagnosis not present

## 2019-12-17 DIAGNOSIS — I2510 Atherosclerotic heart disease of native coronary artery without angina pectoris: Secondary | ICD-10-CM | POA: Diagnosis not present

## 2019-12-17 DIAGNOSIS — Z79899 Other long term (current) drug therapy: Secondary | ICD-10-CM | POA: Diagnosis not present

## 2019-12-17 DIAGNOSIS — I130 Hypertensive heart and chronic kidney disease with heart failure and stage 1 through stage 4 chronic kidney disease, or unspecified chronic kidney disease: Secondary | ICD-10-CM | POA: Diagnosis not present

## 2019-12-17 DIAGNOSIS — I428 Other cardiomyopathies: Secondary | ICD-10-CM | POA: Diagnosis not present

## 2019-12-17 DIAGNOSIS — E785 Hyperlipidemia, unspecified: Secondary | ICD-10-CM | POA: Insufficient documentation

## 2019-12-17 DIAGNOSIS — Z923 Personal history of irradiation: Secondary | ICD-10-CM | POA: Insufficient documentation

## 2019-12-17 DIAGNOSIS — Z7982 Long term (current) use of aspirin: Secondary | ICD-10-CM | POA: Diagnosis not present

## 2019-12-17 DIAGNOSIS — Z9989 Dependence on other enabling machines and devices: Secondary | ICD-10-CM | POA: Insufficient documentation

## 2019-12-17 DIAGNOSIS — Z8546 Personal history of malignant neoplasm of prostate: Secondary | ICD-10-CM | POA: Diagnosis not present

## 2019-12-17 DIAGNOSIS — N183 Chronic kidney disease, stage 3 unspecified: Secondary | ICD-10-CM | POA: Diagnosis not present

## 2019-12-17 DIAGNOSIS — G4733 Obstructive sleep apnea (adult) (pediatric): Secondary | ICD-10-CM | POA: Diagnosis not present

## 2019-12-17 DIAGNOSIS — I272 Pulmonary hypertension, unspecified: Secondary | ICD-10-CM | POA: Insufficient documentation

## 2019-12-17 DIAGNOSIS — I5022 Chronic systolic (congestive) heart failure: Secondary | ICD-10-CM | POA: Diagnosis not present

## 2019-12-17 MED ORDER — CARVEDILOL 12.5 MG PO TABS: 18.7500 mg | ORAL_TABLET | Freq: Two times a day (BID) | ORAL | 3 refills | Status: DC

## 2019-12-17 NOTE — Patient Instructions (Addendum)
It was a pleasure seeing you today!  MEDICATIONS: -We are changing your medications today -Stop taking isosorbide mononitrate (Imdur) -Increase carvedilol to 18.75 mg (1.5 tablets) twice daily -Call if you have questions about your medications.    NEXT APPOINTMENT: Return to clinic in 6 weeks wit Pharmacy Clinic  In general, to take care of your heart failure: -Limit your fluid intake to 2 Liters (half-gallon) per day.   -Limit your salt intake to ideally 2-3 grams (2000-3000 mg) per day. -Weigh yourself daily and record, and bring that "weight diary" to your next appointment.  (Weight gain of 2-3 pounds in 1 day typically means fluid weight.) -The medications for your heart are to help your heart and help you live longer.   -Please contact us before stopping any of your heart medications.  Call the clinic at [phone] with questions or to reschedule future appointments.

## 2019-12-17 NOTE — Progress Notes (Signed)
PCP:  [name], MD        Cardiologist:  [name], MD HF Cardiology: Dr Aundra Dubin   HPI: Allen Dyer is a 84 y.o. male with a history of DM, rheumatic fever, HTN, hyperlipidemia, CKD 3, prostate CA s/p XRT, systolic HF due to NICM (EF 30-35%) 05/2018,pulmonary venous HTN, and mild nonobstructive CAD.  Admitted 03/11-03/18/20 with volume overload. Diuresed with IV lasix. HF team consulted with concern for pulmonary HTN due to ongoing oxygen need. RHC completed and showed moderate pulmonary venousHTN with normal PVR 1.7 (no PAH). Transitioned from Lasix to torsemide 40 mg BID at DC. Oxygen weaned off. HF medications optimized as able. Limited by CKD. DC weight 213 lbs.  Patient was diagnosed with smoldering myeloma in 10/2018, no treatment planned at this time.   Cardiolite in 05/2019 showed EF 36%, fixed anteroapical defect, no ischemia.   Returned for followup of CHF with Dr. Aundra Dubin on 11/21/19.  He was using CPAP.  No chest pain.  No exertional dyspnea.  No palpitations.  No orthopnea/PND.  Main complaint had been low BP at times.  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  He got lightheaded, nurse checked BP and found it to be 86/50.  He got lightheaded again the following Tuesday, SBP in 80s when he checked.  His home SBP readings ranged from 80s-130s.  BP was good. No syncope, no falls.  Weight was stable. No palpitations.   Today he returns to HF clinic for pharmacist medication titration. At last visit with MD, carvedilol was reduced to 12.5 mg BID and hydralazine reduced to 50 mg TID due to periodic symptomatic hypotension. Overall, he's feels good today. Denies dizziness/lightheadedness or orthostasis. No instances of SBP <100. No fatigue. Denies chest pain or palpitations. Able to walk a few miles before getting SOB. Takes torsemide 40 mg BID and denies need for additional diuretic dose. No LEE on exam. Denies PND/Orthopnea. No  problem affording or obtaining medications.  HF Medications: Carvedilol 12.5 mg BID Spironolactone 25 mg daily Hydralazine 50 mg BID Imdur 90 mg daily  Torsemide 40 mg BID  Has the patient been experiencing any side effects to the medications prescribed?  no  Does the patient have any problems obtaining medications due to transportation or finances?  No- [employer] Prescription insurance  And Medicare Part A/B  Understanding of regimen: good Understanding of indications: good Potential of compliance: good Patient understands to avoid NSAIDs. Patient understands to avoid decongestants.    Pertinent Lab Values: 12/02/2019 . Serum creatinine 2.54, BUN 32, Potassium 3.9, Sodium 142, BNP 139 pg/mL (09/18/2019)  Vital Signs: . Weight: 227.6 lbs (last clinic weight: 223 lbs) . Blood pressure: 152/86  - home BP reading this morning 131/60 after taking medications. Patient endorses history of white coat hypertension. Marland Kitchen Heart rate: 66 bpm  Assessment: 1. Chronic systolic HF:  New diagnosis 05/2018, EF 30-35% by ECHO.Due to NICM. LHC 05/2018 with mild non-osbtructive CAD (50% proximal LAD stenosis).Has had high BP for a long time, but it has generally been controlled recently. Had palpitations in past, but very few PVCs on telemetry when in the hospital. Prior hx of ETOH abuse, but none in 40+ years. Did not receive chemotherapy with prior prostate cancer. QRS is wide on EKG, but it is RBBB. He has multiple myeloma but he does not have ventricular wall thickening that would be suggestive of cardiac amyloidosis.  [place] 06/2018 with mildly elevated  filling pressures, CO preserved, pulmonary venous HTN. No PAH. ECHO in 10/2018 showed that EF remains low at 25-30% with moderate LV dilation, no LVH, and normal RV.   - NYHA class II symptoms.  He is not volume overloaded on exam.    - Continue torsemide 40 mg BID - Increase carvedilol to 18.75 mg BID - No ACE/ARB/ARNI with CKD stage  3 -Continuespironolactone36m qHS - Continue hydralazine 50 mg TID  - Discontinue Imdur 90 mg daily due to contraindication of coadministration with Cialis likely causing his orthostatic hypotension. - With advanced age and nonischemic etiology of cardiomyopathy, would avoid ICD.  He has RBBB so would not likely benefit from CRT.  - Repeat echo at 3 month followup.   2. CKD Stage 3: Creatinine stable at 2.54 on 12/02/2019. - Referred to nephrology.   3. CAD: 50% pLAD on LHC in 05/2018. Cardiolite in 05/2019 showed no ischemia.   - Has not been able to tolerate statins but LDL has been low.  -ContinueASA 81 mg daily.   4. Severe OSA: Continue CPAP  5. Pulmonary hypertension: He had pulmonary venous hypertension based on RHC.   6. Smoldering myeloma:  No plans for chemotherapy treatment for now.     Plan: 1) Medication changes: Based on clinical presentation, vital signs and recent labs will discontinue Imdur 90 mg daily due to contraindication with Cialis and increase carvedilol to 18.75 mg BID. 2) Follow-up with pharmacy for further medication titration on 01/28/20. ECHO and follow-up on 02/23/2020 with Dr. MAundra Dubin   LAudry Riles PharmD, BCPS, BCCP, CPP Heart Failure Clinic Pharmacist [phone]

## 2019-12-21 NOTE — Progress Notes (Signed)
Virtual Visit via Telephone  Note   This visit type was conducted due to national recommendations for restrictions regarding the COVID-19 Pandemic (e.g. social distancing) in an effort to limit this patient's exposure and mitigate transmission in our community.  Due to his co-morbid illnesses, this patient is at least at moderate risk for complications without adequate follow up.  This format is felt to be most appropriate for this patient at this time.  All issues noted in this document were discussed and addressed.  A limited physical exam was performed with this format.  Please refer to the patient's chart for his consent to telehealth for [facility].   Evaluation Performed:  Follow-up visit  This visit type was conducted due to national recommendations for restrictions regarding the COVID-19 Pandemic (e.g. social distancing).  This format is felt to be most appropriate for this patient at this time.  All issues noted in this document were discussed and addressed.  No physical exam was performed (except for noted visual exam findings with Video Visits).  Please refer to the patient's chart (MyChart message for video visits and phone note for telephone visits) for the patient's consent to telehealth for [facility].  Date:  12/22/2019   ID:  Allen Dyer, DOB 07/21/1935, MRN [id number]  Patient Location:  Home  Provider location:   [place]  PCP:  [name], MD  Cardiologist:  [name], MD/[name], MD Electrophysiologist:  None   Chief Complaint:  OSA and HTN  History of Present Illness:    Allen Dyer is a 84 y.o. male who presents via audio/video conferencing for a telehealth visit today.    This is a 84yo male with a history of type 2 diabetes mellitus, rheumatic fever, hypertension, peripheral neuropathy with edema, hyperlipidemia, chronic CKD stage III, prostate CA status post XRT.   2D echo 05/29/2018 showed moderate to severe LV dysfunction with EF 30 to  35% with moderately dilated LV and grade 2 diastolic dysfunction, moderate pulmonary hypertension with PASP 59 mmHg.   He underwent R/L heart cath showing mildly elevated right and left heart filling pressures with moderate pulmonary venous HTN with normal PVR 1.7 and preserved CO. He had nonobstructive CAD with 50% pLADHe was changed form Lasix to Torsemide 34m BID.   Repeat 2D echo on guideline directed HF therapy revealed persistent LV dysfunction with EF 25-30% and grade 2 DD and mild to moderate AR.  He is followed in AHF clinic.     He was dx with severe OSA with an AHI of 59/hr and nocturnal hypoxemia with O2 desats as low as 63% and was titrated and now on BiPAP at 19/15cm H2O.  He is doing well with his CPAP device and thinks that he has gotten used to it.  He tolerates the mask and feels the pressure is adequate.  Since going on CPAP he feels rested in the am and has no significant daytime sleepiness.  He denies any significant mouth or nasal dryness or nasal congestion.  He does not think that he snores.     The patient does not have symptoms concerning for COVID-19 infection (fever, chills, cough, or new shortness of breath).    Prior CV studies:   The following studies were reviewed today:  PAP compliance download  Past Medical History:  Diagnosis Date  . Chronic combined systolic and diastolic CHF (congestive heart failure) ([place]   . CKD (chronic kidney disease), stage III   . Hyperlipidemia   . Hypertension   .  Lower extremity edema   . Mild CAD    a. mild-mod by cath 05/2018.  . Multiple myeloma ([place]) 11/15/2018  . NICM (nonischemic cardiomyopathy) ([place])   . Peripheral neuropathy   . Prostate cancer ([place])    Status post XRT  . Rheumatic fever   . Trifascicular block   . Type 2 diabetes mellitus ([place])    Past Surgical History:  Procedure Laterality Date  . RIGHT HEART CATH N/A 07/10/2018   Procedure: RIGHT HEART CATH;  Surgeon: Larey Dresser, MD;  Location: [place] [facility];  Service: Cardiovascular;  Laterality: N/A;  . RIGHT/LEFT HEART CATH AND CORONARY ANGIOGRAPHY N/A 06/18/2018   Procedure: RIGHT/LEFT HEART CATH AND CORONARY ANGIOGRAPHY;  Surgeon: Troy Sine, MD;  Location: [place] [facility];  Service: Cardiovascular;  Laterality: N/A;     Current Meds  Medication Sig  . aspirin EC 81 MG tablet Take 81 mg by mouth every evening.   . carvedilol (COREG) 12.5 MG tablet Take 1.5 tablets (18.75 mg total) by mouth 2 (two) times daily.  . Cholecalciferol (VITAMIN D3 PO) Take 1 tablet by mouth daily.   . cyanocobalamin 100 MCG tablet Take 100 mcg by mouth daily.   Marland Kitchen gabapentin (NEURONTIN) 800 MG tablet Take 800 mg by mouth 2 (two) times daily.   Marland Kitchen glipiZIDE (GLUCOTROL) 10 MG tablet Take by mouth. Take one tablet by mouth every morning and half tablet every evening  . hydrALAZINE (APRESOLINE) 50 MG tablet Take 1 tablet (50 mg total) by mouth 3 (three) times daily.  . Multiple Vitamin (MULTIVITAMIN WITH MINERALS) TABS tablet Take 1 tablet by mouth 2 (two) times daily.  Marland Kitchen PRECISION XTRA TEST STRIPS test strip   . spironolactone (ALDACTONE) 25 MG tablet Take 1 tablet (25 mg total) by mouth at bedtime.  . tadalafil (CIALIS) 5 MG tablet Take 5 mg by mouth every 3 (three) days.  . tamsulosin (FLOMAX) 0.4 MG CAPS capsule Take 0.4 mg by mouth 2 (two) times daily.   Marland Kitchen torsemide (DEMADEX) 20 MG tablet Take 2 tablets (40 mg total) by mouth 2 (two) times daily.     Allergies:   Tramadol, Finasteride, Lisinopril, Ciprofloxacin, Simvastatin, and Sulfa antibiotics   Social History   Tobacco Use  . Smoking status: Former Research scientist (life sciences)  . Smokeless tobacco: Never Used  Vaping Use  . Vaping Use: Never used  Substance Use Topics  . Alcohol use: Never  . Drug use: Never     Family Hx: The patient's family history includes Cancer in his mother and sister; Congestive Heart Failure in his brother and father.  ROS:   Please see the history of present illness.      All other systems reviewed and are negative.   Labs/Other Tests and Data Reviewed:    Recent Labs: 09/18/2019: B Natriuretic Peptide 139.0 12/02/2019: ALT 13; BUN 32; Creatinine, Ser 2.54; Hemoglobin 10.8; Platelets 130; Potassium 3.9; Sodium 142   Recent Lipid Panel Lab Results  Component Value Date/Time   CHOL 108 07/10/2018 03:37 AM   TRIG 68 07/10/2018 03:37 AM   HDL 32 (L) 07/10/2018 03:37 AM   CHOLHDL 3.4 07/10/2018 03:37 AM   LDLCALC 62 07/10/2018 03:37 AM    Wt Readings from Last 3 Encounters:  12/22/19 220 lb (99.8 kg)  12/17/19 227 lb 9.6 oz (103.2 kg)  12/09/19 226 lb 3.2 oz (102.6 kg)     Objective:    Vital Signs:  BP 129/70   Pulse (!) 55  Ht 5' 7"  (1.702 m)   Wt 220 lb (99.8 kg)   BMI 34.46 kg/m    ASSESSMENT & PLAN:    1.  OSA - The patient is tolerating PAP therapy well without any problems. The PAP download was reviewed today and showed an AHI of 4.8/hr on 19/15 cm H2O with 100% compliance in using more than 4 hours nightly.  The patient has been using and benefiting from PAP use and will continue to benefit from therapy.   2.  HTN -BP controlled -continue on spiro 40m daily, Hydralazine 542mTID and Carvedilol 18.7518mID.  3.  Obesity - I have encouraged he to get into a routine exercise program and cut back on carbs and portions.    COVID-19 Education: The signs and symptoms of COVID-19 were discussed with the patient and how to seek care for testing (follow up with PCP or arrange E-visit).  The importance of social distancing was discussed today.  Patient Risk:   After full review of this patient's clinical status, I feel that they are at least moderate risk at this time.  Time:   Today, I have spent 20 minutes on telemedicine discussing medical problems including OSA, HTN and  reviewing patient's chart including PAP compliance download.  Medication Adjustments/Labs and Tests Ordered: Current medicines are reviewed at length with the  patient today.  Concerns regarding medicines are outlined above.  Tests Ordered: No orders of the defined types were placed in this encounter.  Medication Changes: No orders of the defined types were placed in this encounter.   Disposition:  Follow up in 1 year(s)  Signed, TraFransico HimD  12/22/2019 10:08 AM     [facility]

## 2019-12-22 ENCOUNTER — Encounter: Payer: Self-pay | Admitting: Cardiology

## 2019-12-22 ENCOUNTER — Other Ambulatory Visit: Payer: Self-pay

## 2019-12-22 ENCOUNTER — Telehealth (INDEPENDENT_AMBULATORY_CARE_PROVIDER_SITE_OTHER): Payer: Medicare Other | Admitting: Cardiology

## 2019-12-22 VITALS — BP 129/70 | HR 55 | Ht 67.0 in | Wt 220.0 lb

## 2019-12-22 DIAGNOSIS — I10 Essential (primary) hypertension: Secondary | ICD-10-CM | POA: Diagnosis not present

## 2019-12-22 DIAGNOSIS — G4733 Obstructive sleep apnea (adult) (pediatric): Secondary | ICD-10-CM

## 2019-12-22 NOTE — Patient Instructions (Signed)
Medication Instructions:  Your physician recommends that you continue on your current medications as directed. Please refer to the Current Medication list given to you today.  *If you need a refill on your cardiac medications before your next appointment, please call your pharmacy*   Lab Work: none If you have labs (blood work) drawn today and your tests are completely normal, you will receive your results only by: . MyChart Message (if you have MyChart) OR . A paper copy in the mail If you have any lab test that is abnormal or we need to change your treatment, we will call you to review the results.   Testing/Procedures: none   Follow-Up: At [facility], you and your health needs are our priority.  As part of our continuing mission to provide you with exceptional heart care, we have created designated Provider Care Teams.  These Care Teams include your primary Cardiologist (physician) and Advanced Practice Providers (APPs -  Physician Assistants and Nurse Practitioners) who all work together to provide you with the care you need, when you need it.  We recommend signing up for the patient portal called "MyChart".  Sign up information is provided on this After Visit Summary.  MyChart is used to connect with patients for Virtual Visits (Telemedicine).  Patients are able to view lab/test results, encounter notes, upcoming appointments, etc.  Non-urgent messages can be sent to your provider as well.   To learn more about what you can do with MyChart, go to [email or web address].    Your next appointment:   12 month(s)  The format for your next appointment:   Virtual Visit   Provider:   Traci Turner, MD   Other Instructions   

## 2019-12-25 ENCOUNTER — Other Ambulatory Visit: Payer: Self-pay | Admitting: Podiatry

## 2019-12-25 DIAGNOSIS — M1990 Unspecified osteoarthritis, unspecified site: Secondary | ICD-10-CM

## 2019-12-25 DIAGNOSIS — H938X3 Other specified disorders of ear, bilateral: Secondary | ICD-10-CM | POA: Diagnosis not present

## 2020-01-06 ENCOUNTER — Ambulatory Visit: Payer: TRICARE For Life (TFL) | Admitting: Podiatry

## 2020-01-28 ENCOUNTER — Inpatient Hospital Stay (HOSPITAL_COMMUNITY)
Admission: RE | Admit: 2020-01-28 | Discharge: 2020-01-28 | Disposition: A | Discharge status: Home / Self Care | Payer: Medicare Other | Source: Ambulatory Visit

## 2020-01-30 DIAGNOSIS — Z79899 Other long term (current) drug therapy: Secondary | ICD-10-CM | POA: Diagnosis not present

## 2020-01-30 DIAGNOSIS — I5022 Chronic systolic (congestive) heart failure: Secondary | ICD-10-CM | POA: Diagnosis not present

## 2020-01-30 DIAGNOSIS — D472 Monoclonal gammopathy: Secondary | ICD-10-CM | POA: Diagnosis not present

## 2020-01-30 DIAGNOSIS — E7800 Pure hypercholesterolemia, unspecified: Secondary | ICD-10-CM | POA: Diagnosis not present

## 2020-01-30 DIAGNOSIS — N184 Chronic kidney disease, stage 4 (severe): Secondary | ICD-10-CM | POA: Diagnosis not present

## 2020-01-30 DIAGNOSIS — E1169 Type 2 diabetes mellitus with other specified complication: Secondary | ICD-10-CM | POA: Diagnosis not present

## 2020-02-06 DIAGNOSIS — L98491 Non-pressure chronic ulcer of skin of other sites limited to breakdown of skin: Secondary | ICD-10-CM | POA: Diagnosis not present

## 2020-02-23 ENCOUNTER — Ambulatory Visit (HOSPITAL_BASED_OUTPATIENT_CLINIC_OR_DEPARTMENT_OTHER)
Admission: RE | Admit: 2020-02-23 | Discharge: 2020-02-23 | Disposition: A | Discharge status: Home / Self Care | Payer: Medicare Other | Source: Ambulatory Visit | Attending: Cardiology | Admitting: Cardiology

## 2020-02-23 ENCOUNTER — Ambulatory Visit (HOSPITAL_COMMUNITY)
Admission: RE | Admit: 2020-02-23 | Discharge: 2020-02-23 | Disposition: A | Discharge status: Home / Self Care | Payer: Medicare Other | Source: Ambulatory Visit | Attending: Cardiology | Admitting: Cardiology

## 2020-02-23 ENCOUNTER — Other Ambulatory Visit: Payer: Self-pay

## 2020-02-23 ENCOUNTER — Encounter (HOSPITAL_COMMUNITY): Payer: Self-pay | Admitting: Cardiology

## 2020-02-23 VITALS — BP 132/80 | HR 55 | Wt 224.4 lb

## 2020-02-23 DIAGNOSIS — I272 Pulmonary hypertension, unspecified: Secondary | ICD-10-CM | POA: Diagnosis not present

## 2020-02-23 DIAGNOSIS — N183 Chronic kidney disease, stage 3 unspecified: Secondary | ICD-10-CM | POA: Diagnosis not present

## 2020-02-23 DIAGNOSIS — I10 Essential (primary) hypertension: Secondary | ICD-10-CM | POA: Diagnosis not present

## 2020-02-23 DIAGNOSIS — Z87891 Personal history of nicotine dependence: Secondary | ICD-10-CM | POA: Diagnosis not present

## 2020-02-23 DIAGNOSIS — Z8249 Family history of ischemic heart disease and other diseases of the circulatory system: Secondary | ICD-10-CM | POA: Insufficient documentation

## 2020-02-23 DIAGNOSIS — I4510 Unspecified right bundle-branch block: Secondary | ICD-10-CM | POA: Insufficient documentation

## 2020-02-23 DIAGNOSIS — R0602 Shortness of breath: Secondary | ICD-10-CM | POA: Insufficient documentation

## 2020-02-23 DIAGNOSIS — G4733 Obstructive sleep apnea (adult) (pediatric): Secondary | ICD-10-CM | POA: Diagnosis not present

## 2020-02-23 DIAGNOSIS — I5022 Chronic systolic (congestive) heart failure: Secondary | ICD-10-CM | POA: Insufficient documentation

## 2020-02-23 DIAGNOSIS — Z7984 Long term (current) use of oral hypoglycemic drugs: Secondary | ICD-10-CM | POA: Insufficient documentation

## 2020-02-23 DIAGNOSIS — E1122 Type 2 diabetes mellitus with diabetic chronic kidney disease: Secondary | ICD-10-CM | POA: Diagnosis not present

## 2020-02-23 DIAGNOSIS — Z7901 Long term (current) use of anticoagulants: Secondary | ICD-10-CM | POA: Diagnosis not present

## 2020-02-23 DIAGNOSIS — I2510 Atherosclerotic heart disease of native coronary artery without angina pectoris: Secondary | ICD-10-CM | POA: Insufficient documentation

## 2020-02-23 DIAGNOSIS — Z7982 Long term (current) use of aspirin: Secondary | ICD-10-CM | POA: Diagnosis not present

## 2020-02-23 DIAGNOSIS — I428 Other cardiomyopathies: Secondary | ICD-10-CM | POA: Diagnosis not present

## 2020-02-23 DIAGNOSIS — I130 Hypertensive heart and chronic kidney disease with heart failure and stage 1 through stage 4 chronic kidney disease, or unspecified chronic kidney disease: Secondary | ICD-10-CM | POA: Insufficient documentation

## 2020-02-23 DIAGNOSIS — Z79899 Other long term (current) drug therapy: Secondary | ICD-10-CM | POA: Diagnosis not present

## 2020-02-23 DIAGNOSIS — E785 Hyperlipidemia, unspecified: Secondary | ICD-10-CM | POA: Diagnosis not present

## 2020-02-23 LAB — BASIC METABOLIC PANEL
Anion gap: 7 (ref 5–15)
BUN: 23 mg/dL (ref 8–23)
CO2: 29 mmol/L (ref 22–32)
Calcium: 9.3 mg/dL (ref 8.9–10.3)
Chloride: 101 mmol/L (ref 98–111)
Creatinine, Ser: 2.03 mg/dL — ABNORMAL HIGH (ref 0.61–1.24)
GFR, Estimated: 32 mL/min — ABNORMAL LOW (ref >60)
Glucose, Bld: 227 mg/dL — ABNORMAL HIGH (ref 70–99)
Potassium: 3.7 mmol/L (ref 3.5–5.1)
Sodium: 137 mmol/L (ref 135–145)

## 2020-02-23 LAB — ECHOCARDIOGRAM COMPLETE
Area-P 1/2: 2.6 cm2
Calc EF: 32 %
P 1/2 time: 562 msec
S' Lateral: 5.3 cm
Single Plane A2C EF: 29.2 %
Single Plane A4C EF: 37.5 %

## 2020-02-23 MED ORDER — TORSEMIDE 20 MG PO TABS: ORAL_TABLET | ORAL | 3 refills | Status: DC

## 2020-02-23 MED ORDER — CARVEDILOL 25 MG PO TABS: 25.0000 mg | ORAL_TABLET | Freq: Two times a day (BID) | ORAL | 3 refills | Status: DC

## 2020-02-23 NOTE — Progress Notes (Signed)
  Echocardiogram 2D Echocardiogram has been performed.  Allen Dyer 02/23/2020, 9:40 AM

## 2020-02-23 NOTE — Patient Instructions (Signed)
INCREASE Carvedilol 25mg  (1 tablet) twice daily  INCREASE Torsemide 60mg  (3 tablets) twice a day FOR 2 DAYS   Then take Torsemide 60mg  (3 tablets) every morning and 40mg  (2 tablets) every evening.  Labs done today, your results will be available in MyChart, we will contact you for abnormal readings.  Your physician recommends that you return for repeat labs in 10 days.  Your physician recommends that you have a follow up appointment in 3 weeks.  If you have any questions or concerns before your next appointment please send [place] a message through [place] or call our office at [phone].    TO LEAVE A MESSAGE FOR THE NURSE SELECT OPTION 2, PLEASE LEAVE A MESSAGE INCLUDING: . YOUR NAME . DATE OF BIRTH . CALL BACK NUMBER . REASON FOR CALL**this is important as we prioritize the call backs  YOU WILL RECEIVE A CALL BACK THE SAME DAY AS LONG AS YOU CALL BEFORE 4:00 PM

## 2020-02-23 NOTE — Progress Notes (Signed)
Date:  02/23/2020   ID:  Fara Chute, DOB 03/28/1936, MRN [id number]    Provider location: [facility] Type of Visit: Established patient   PCP:  [name], MD  Cardiologist:  [name], MD HF Cardiology: Dr Aundra Dubin   History of Present Illness: Allen Dyer is a 84 y.o. male with a history of DM, rheumatic fever, HTN, hyperlipidemia, CKD 3, prostate CA s/p XRT, systolic HF due to NICM (EF 30-35%) 05/2018,pulmonary venous HTN, and mild nonobstructive CAD.  Admitted 3/11-3/18/20 with volume overload. Diuresed with IV lasix. HF team consulted with concern for pulmonary HTN due to ongoing oxygen need. RHC completed and showed moderate pulmonary venousHTN with normal PVR 1.7 (no PAH). Transitioned from Lasix to torsemide 40 mg BID at DC. Oxygen weaned off. HF medications optimized as able. Limited by CKD. DC weight 213 lbs.   Patient was diagnosed with smoldering myeloma in 7/20, no treatment planned at this time.   Cardiolite in 2/21 showed EF 36%, fixed anteroapical defect, no ischemia.  Echo was done today and reviewed, EF 30-35%, diffuse hypokinesis, normal RV, PASP 45 mmHg, dilated IVC.   He returns for followup of CHF.  He is using CPAP.  He stopped Imdur as he has been taking tadalafil for urinary retention, and the two meds may have been interacting making him lightheaded.  No further LH. Weight is up 1 lb.  He has been more short of breath recently but still can walk about 1-1.5 miles.  He has had abdominal and leg swelling.  No chest pain.  No orthopnea/PND.  He skips his am torsemide 2-3 times/week.  Follows with Dr. Justin Mend for nephrology.   ECG (personally reviewed): NSR, LAFB, RBBB (no changes)  Labs (7/20): creatinine 2.29 => 1.97, K 3.5, hgb 11.6.  Labs (10/20): Creatinine 2.0 Labs (11/20): creatinine 2.5 Labs (1/21): K 3.7, creatinine 2.28 Labs (5/21): K 4, creatinine 2.33 Labs (8/21): K 3.9, creatinine 2.5   PMH: 1. CKD: Stage 3.  2.  Hyperlipidemia: Statin intolerant.  3. HTN 4. Smoldering myeloma: Diagnosed in 7/20.  - Bone marrow biopsy stained negative for amyloidosis.  5. Prostate cancer s/p XRT.  6. Type 2 diabetes.  7. Chronic systolic CHF: Nonischemic cardiomyopathy.  - RHC/LHC (2/20): Nonobstructive CAD with 50% pLAD; mean RA 13, PA 72/27 mean 47, mean PCWP 29, CI 2.8, PVR 3 WU - Echo (2/20): EF 30-35%.  - RHC (3/20): mean RA 9, PA 55/20 mean 32, mean PCWP 20, CI 3.29, PVR 1.7 WU - Echo (7/20): EF 25-30%, moderate LV dilation without LVH, normal RV size and systolic function, mild-moderate AI.  - Cardiolite (2/21): EF 36%, fixed anteroapical defect, no ischemia.  - Echo (11/21): EF 30-35%, diffuse hypokinesis, normal RV, PASP 45 mmHg, dilated IVC.  8. Pulmonary venous hypertension.  - V/Q scan (3/20): No evidence for chronic PE.  9. OSA: Using CPAP.   Past Surgical History:  Procedure Laterality Date  . RIGHT HEART CATH N/A 07/10/2018   Procedure: RIGHT HEART CATH;  Surgeon: Larey Dresser, MD;  Location: [facility];  Service: Cardiovascular;  Laterality: N/A;  . RIGHT/LEFT HEART CATH AND CORONARY ANGIOGRAPHY N/A 06/18/2018   Procedure: RIGHT/LEFT HEART CATH AND CORONARY ANGIOGRAPHY;  Surgeon: Troy Sine, MD;  Location: [facility];  Service: Cardiovascular;  Laterality: N/A;     Current Outpatient Medications  Medication Sig Dispense Refill  . aspirin EC 81 MG tablet Take 81 mg by mouth  every evening.     . carvedilol (COREG) 25 MG tablet Take 1 tablet (25 mg total) by mouth 2 (two) times daily. 60 tablet 3  . Cholecalciferol (VITAMIN D3 PO) Take 1 tablet by mouth daily.     . cyanocobalamin 100 MCG tablet Take 100 mcg by mouth daily.     Marland Kitchen glipiZIDE (GLUCOTROL) 10 MG tablet Take 10 mg by mouth daily before breakfast.    . hydrALAZINE (APRESOLINE) 50 MG tablet Take 1 tablet (50 mg total) by mouth 3 (three) times daily. 90 tablet 3  . Multiple Vitamin (MULTIVITAMIN WITH MINERALS) TABS  tablet Take 1 tablet by mouth 2 (two) times daily.    Marland Kitchen PRECISION XTRA TEST STRIPS test strip     . spironolactone (ALDACTONE) 25 MG tablet Take 1 tablet (25 mg total) by mouth at bedtime. 30 tablet 11  . tadalafil (CIALIS) 5 MG tablet Take 5 mg by mouth every 3 (three) days.    . tamsulosin (FLOMAX) 0.4 MG CAPS capsule Take 0.4 mg by mouth 2 (two) times daily.     Marland Kitchen torsemide (DEMADEX) 20 MG tablet Take 3 tablets (60 mg total) by mouth in the morning AND 2 tablets (40 mg total) daily after supper. 150 tablet 3   No current facility-administered medications for this encounter.    Allergies:   Tramadol, Finasteride, Lisinopril, Ciprofloxacin, Simvastatin, and Sulfa antibiotics   Social History:  The patient  reports that he has quit smoking. He has never used smokeless tobacco. He reports that he does not drink alcohol and does not use drugs.   Family History:  The patient's family history includes Cancer in his mother and sister; Congestive Heart Failure in his brother and father.   ROS:  Please see the history of present illness.   All other systems are personally reviewed and negative.  Vitals:   02/23/20 0942  BP: 132/80  Pulse: (!) 55    Exam:   BP 132/80   Pulse (!) 55   Wt 101.8 kg (224 lb 6.4 oz)   BMI 35.15 kg/m  General: NAD Neck: JVP 8-9 cm, no thyromegaly or thyroid nodule.  Lungs: Clear to auscultation bilaterally with normal respiratory effort. CV: Nondisplaced PMI.  Heart regular S1/S2, no S3/S4, no murmur.  1+ edema 1/2 to knees bilaterally.  No carotid bruit.  Normal pedal pulses.  Abdomen: Soft, nontender, no hepatosplenomegaly, no distention.  Skin: Intact without lesions or rashes.  Neurologic: Alert and oriented x 3.  Psych: Normal affect. Extremities: No clubbing or cyanosis.  HEENT: Normal.   Recent Labs: 09/18/2019: B Natriuretic Peptide 139.0 12/02/2019: ALT 13; Hemoglobin 10.8; Platelets 130 02/23/2020: BUN 23; Creatinine, Ser 2.03; Potassium 3.7;  Sodium 137  Personally reviewed   Wt Readings from Last 3 Encounters:  02/23/20 101.8 kg (224 lb 6.4 oz)  12/22/19 99.8 kg (220 lb)  12/17/19 103.2 kg (227 lb 9.6 oz)     ASSESSMENT AND PLAN:  1. Chronic systolic HF:  New diagnosis 05/2018, EF 30-35% by echo.Due to NICM. LHC 05/2018 with mild non-osbtructive CAD (50% proximal LAD stenosis).Has had high BP for a long time, but it has generally been controlled recently. Had palpitations in past, but very few PVCs on telemetry when in the hospital. Prior hx of ETOH abuse, but none in 40+ years. Did not receive chemotherapy with prior prostate cancer. QRS is wide on EKG, but it is RBBB. He has multiple myeloma but he does not have ventricular wall thickening that would  be suggestive of cardiac amyloidosis.  [place] 3/20 with mildly elevated filling pressures, CO preserved, pulmonary venous HTN. No PAH. Echo in 7/20 showed that EF remains low at 25-30% with moderate LV dilation, no LVH, and normal RV.  Echo done today showed EF 30-35%, diffuse hypokinesis, normal RV, PASP 45 mmHg, dilated IVC.  He skips am torsemide several times a week and is now volume overloaded with NYHA class III symptoms.  This is complicated by CKD stage 3.  - Increase torsemide to 60 mg bid x 3 days then 60 qam/40 qpm after that.  BMET today and again in 10 days.    - No ACE/ARB/ARNI with CKD stage 3.  - Increase Coreg to 25 mg bid.  - Continue hydralazine 50 mg tid.  He is now off Imdur because he is on tadalafil for urine flow.  Ideally would be on Imdur, will restart if he is able to stop tadalafil in the future.  -Continuespironolactone 25 mg qhs but follow K and creatinine closely. - With advanced age and nonischemic etiology of cardiomyopathy, would avoid ICD.  He has RBBB so would not likely benefit from CRT.  2. CKD Stage 3: Last creatinine 2.5.  - BMET today.  - Sees nephrology.  3. CAD: 50% pLAD on LHC in 2/20. Cardiolite in 2/21 showed no ischemia.   - He has not  been able to tolerate statins but LDL has been low.  -ContinueASA 81 mg daily.  4. Severe OSA: Continue CPAP.  5. Pulmonary hypertension: He had pulmonary venous hypertension based on RHC.  6. Smoldering myeloma:  No plans for chemotherapy treatment for now.    See NP/PA in 3 wks to followup on volume status.         Signed, Loralie Champagne, MD  02/23/2020  [facility] [address] [facility] and [place] [address] [phone] (office) [phone] (fax)

## 2020-02-24 DIAGNOSIS — I2510 Atherosclerotic heart disease of native coronary artery without angina pectoris: Secondary | ICD-10-CM | POA: Diagnosis not present

## 2020-02-24 DIAGNOSIS — N183 Chronic kidney disease, stage 3 unspecified: Secondary | ICD-10-CM | POA: Diagnosis not present

## 2020-02-24 DIAGNOSIS — E1122 Type 2 diabetes mellitus with diabetic chronic kidney disease: Secondary | ICD-10-CM | POA: Diagnosis not present

## 2020-02-24 DIAGNOSIS — I5020 Unspecified systolic (congestive) heart failure: Secondary | ICD-10-CM | POA: Diagnosis not present

## 2020-02-24 DIAGNOSIS — I129 Hypertensive chronic kidney disease with stage 1 through stage 4 chronic kidney disease, or unspecified chronic kidney disease: Secondary | ICD-10-CM | POA: Diagnosis not present

## 2020-02-24 DIAGNOSIS — N2581 Secondary hyperparathyroidism of renal origin: Secondary | ICD-10-CM | POA: Diagnosis not present

## 2020-02-24 DIAGNOSIS — D472 Monoclonal gammopathy: Secondary | ICD-10-CM | POA: Diagnosis not present

## 2020-02-24 DIAGNOSIS — C61 Malignant neoplasm of prostate: Secondary | ICD-10-CM | POA: Diagnosis not present

## 2020-02-25 DIAGNOSIS — L819 Disorder of pigmentation, unspecified: Secondary | ICD-10-CM | POA: Diagnosis not present

## 2020-02-25 DIAGNOSIS — L98491 Non-pressure chronic ulcer of skin of other sites limited to breakdown of skin: Secondary | ICD-10-CM | POA: Diagnosis not present

## 2020-03-02 DIAGNOSIS — L280 Lichen simplex chronicus: Secondary | ICD-10-CM | POA: Diagnosis not present

## 2020-03-03 ENCOUNTER — Other Ambulatory Visit: Payer: Self-pay

## 2020-03-03 ENCOUNTER — Ambulatory Visit (HOSPITAL_COMMUNITY)
Admission: RE | Admit: 2020-03-03 | Discharge: 2020-03-03 | Disposition: A | Discharge status: Home / Self Care | Payer: Medicare Other | Source: Ambulatory Visit | Attending: Internal Medicine | Admitting: Internal Medicine

## 2020-03-03 DIAGNOSIS — I5022 Chronic systolic (congestive) heart failure: Secondary | ICD-10-CM

## 2020-03-03 LAB — BASIC METABOLIC PANEL
Anion gap: 8 (ref 5–15)
BUN: 35 mg/dL — ABNORMAL HIGH (ref 8–23)
CO2: 29 mmol/L (ref 22–32)
Calcium: 9.2 mg/dL (ref 8.9–10.3)
Chloride: 101 mmol/L (ref 98–111)
Creatinine, Ser: 2.25 mg/dL — ABNORMAL HIGH (ref 0.61–1.24)
GFR, Estimated: 28 mL/min — ABNORMAL LOW (ref >60)
Glucose, Bld: 216 mg/dL — ABNORMAL HIGH (ref 70–99)
Potassium: 3.7 mmol/L (ref 3.5–5.1)
Sodium: 138 mmol/L (ref 135–145)

## 2020-03-16 ENCOUNTER — Encounter: Payer: Self-pay | Admitting: Podiatry

## 2020-03-16 ENCOUNTER — Ambulatory Visit (INDEPENDENT_AMBULATORY_CARE_PROVIDER_SITE_OTHER): Payer: Medicare Other | Admitting: Podiatry

## 2020-03-16 ENCOUNTER — Other Ambulatory Visit: Payer: Self-pay

## 2020-03-16 DIAGNOSIS — M79675 Pain in left toe(s): Secondary | ICD-10-CM

## 2020-03-16 DIAGNOSIS — E1142 Type 2 diabetes mellitus with diabetic polyneuropathy: Secondary | ICD-10-CM

## 2020-03-16 DIAGNOSIS — N183 Chronic kidney disease, stage 3 unspecified: Secondary | ICD-10-CM

## 2020-03-16 DIAGNOSIS — M79674 Pain in right toe(s): Secondary | ICD-10-CM | POA: Diagnosis not present

## 2020-03-16 DIAGNOSIS — B351 Tinea unguium: Secondary | ICD-10-CM | POA: Diagnosis not present

## 2020-03-16 NOTE — Progress Notes (Signed)
This patient returns to my office for at risk foot care.  This patient requires this care by a professional since this patient will be at risk due to having  Diabetes and chronic kidney disease.   This patient is unable to cut nails himself  since the patient cannot reach his  nails.These nails are painful walking and wearing shoes.  This patient presents for at risk foot care today.    General Appearance  Alert, conversant and in no acute stress.  Vascular  Dorsalis pedis and posterior tibial  pulses are palpable  bilaterally.  Capillary return is within normal limits  bilaterally. Temperature is within normal limits  bilaterally.  Neurologic  Senn-Weinstein monofilament wire test diminished   bilaterally. Muscle power within normal limits bilaterally.  Nails Thick disfigured discolored nails with subungual debris  from hallux to fifth toes bilaterally. No evidence of bacterial infection or drainage bilaterally.  Orthopedic  No limitations of motion  feet .  No crepitus or effusions noted.  No bony pathology or digital deformities noted.  Arthritis right foot/ankle.  Skin  normotropic skin with no porokeratosis noted bilaterally.  No signs of infections or ulcers noted.   Asymptomatic porokeratosis sub 5th  B/L.  Onychomycosis  Pain in right toes  Pain in left toes  Consent was obtained for treatment procedures.   Mechanical debridement of nails 1-5  bilaterally performed with a nail nipper.  Filed with dremel without incident. No infection or ulcer.     Return office visit   3 months       Told patient to return for periodic foot care and evaluation due to potential at risk complications.   Chozen Latulippe DPM  

## 2020-03-23 ENCOUNTER — Ambulatory Visit (HOSPITAL_COMMUNITY)
Admission: RE | Admit: 2020-03-23 | Discharge: 2020-03-23 | Disposition: A | Discharge status: Home / Self Care | Payer: Medicare Other | Source: Ambulatory Visit | Attending: Adult Health | Admitting: Adult Health

## 2020-03-23 ENCOUNTER — Other Ambulatory Visit: Payer: Self-pay

## 2020-03-23 VITALS — BP 132/83 | HR 67 | Wt 222.2 lb

## 2020-03-23 DIAGNOSIS — I5022 Chronic systolic (congestive) heart failure: Secondary | ICD-10-CM | POA: Diagnosis not present

## 2020-03-23 DIAGNOSIS — Z923 Personal history of irradiation: Secondary | ICD-10-CM | POA: Diagnosis not present

## 2020-03-23 DIAGNOSIS — Z7984 Long term (current) use of oral hypoglycemic drugs: Secondary | ICD-10-CM | POA: Diagnosis not present

## 2020-03-23 DIAGNOSIS — Z87891 Personal history of nicotine dependence: Secondary | ICD-10-CM | POA: Insufficient documentation

## 2020-03-23 DIAGNOSIS — G4733 Obstructive sleep apnea (adult) (pediatric): Secondary | ICD-10-CM | POA: Insufficient documentation

## 2020-03-23 DIAGNOSIS — Z8546 Personal history of malignant neoplasm of prostate: Secondary | ICD-10-CM | POA: Insufficient documentation

## 2020-03-23 DIAGNOSIS — E1122 Type 2 diabetes mellitus with diabetic chronic kidney disease: Secondary | ICD-10-CM | POA: Diagnosis not present

## 2020-03-23 DIAGNOSIS — I10 Essential (primary) hypertension: Secondary | ICD-10-CM

## 2020-03-23 DIAGNOSIS — I2510 Atherosclerotic heart disease of native coronary artery without angina pectoris: Secondary | ICD-10-CM | POA: Diagnosis not present

## 2020-03-23 DIAGNOSIS — Z79899 Other long term (current) drug therapy: Secondary | ICD-10-CM | POA: Insufficient documentation

## 2020-03-23 DIAGNOSIS — N1832 Chronic kidney disease, stage 3b: Secondary | ICD-10-CM | POA: Diagnosis not present

## 2020-03-23 DIAGNOSIS — Z7982 Long term (current) use of aspirin: Secondary | ICD-10-CM | POA: Diagnosis not present

## 2020-03-23 DIAGNOSIS — I130 Hypertensive heart and chronic kidney disease with heart failure and stage 1 through stage 4 chronic kidney disease, or unspecified chronic kidney disease: Secondary | ICD-10-CM | POA: Insufficient documentation

## 2020-03-23 DIAGNOSIS — C9000 Multiple myeloma not having achieved remission: Secondary | ICD-10-CM | POA: Insufficient documentation

## 2020-03-23 DIAGNOSIS — N183 Chronic kidney disease, stage 3 unspecified: Secondary | ICD-10-CM | POA: Insufficient documentation

## 2020-03-23 DIAGNOSIS — I428 Other cardiomyopathies: Secondary | ICD-10-CM | POA: Diagnosis not present

## 2020-03-23 DIAGNOSIS — Z8249 Family history of ischemic heart disease and other diseases of the circulatory system: Secondary | ICD-10-CM | POA: Diagnosis not present

## 2020-03-23 DIAGNOSIS — I272 Pulmonary hypertension, unspecified: Secondary | ICD-10-CM | POA: Insufficient documentation

## 2020-03-23 DIAGNOSIS — E785 Hyperlipidemia, unspecified: Secondary | ICD-10-CM | POA: Insufficient documentation

## 2020-03-23 NOTE — Patient Instructions (Signed)
It was great to see you today! No medication changes are needed at this time.  Your physician recommends that you schedule a follow-up appointment in: 3-4 months with Dr McLean  If you have any questions or concerns before your next appointment please send us a message through mychart or call our office at [phone].    TO LEAVE A MESSAGE FOR THE NURSE SELECT OPTION 2, PLEASE LEAVE A MESSAGE INCLUDING: . YOUR NAME . DATE OF BIRTH . CALL BACK NUMBER . REASON FOR CALL**this is important as we prioritize the call backs  YOU WILL RECEIVE A CALL BACK THE SAME DAY AS LONG AS YOU CALL BEFORE 4:00 PM   

## 2020-03-23 NOTE — Progress Notes (Signed)
Date:  03/23/2020   ID:  Allen Dyer, DOB [birth year], MRN [id number]    Provider location: [facility] Type of Visit: Established patient   PCP:  [name], MD  Cardiologist:  [name], MD HF Cardiology: Dr Aundra Dubin   History of Present Illness: Allen Dyer is a 84 y.o. male with a history of DM, rheumatic fever, HTN, hyperlipidemia, CKD 3, prostate CA s/p XRT, systolic HF due to NICM (EF 30-35%) 05/2018,pulmonary venous HTN, and mild nonobstructive CAD.  Admitted 3/11-3/18/20 with volume overload. Diuresed with IV lasix. HF team consulted with concern for pulmonary HTN due to ongoing oxygen need. RHC completed and showed moderate pulmonary venousHTN with normal PVR 1.7 (no PAH). Transitioned from Lasix to torsemide 40 mg BID at DC. Oxygen weaned off. HF medications optimized as able. Limited by CKD. DC weight 213 lbs.   Patient was diagnosed with smoldering myeloma in 7/20, no treatment planned at this time.   Cardiolite in 2/21 showed EF 36%, fixed anteroapical defect, no ischemia.  Echo was done today and reviewed, EF 30-35%, diffuse hypokinesis, normal RV, PASP 45 mmHg, dilated IVC.   Today he returns for HF follow up. Last visit coreg increased to 25 mg twice a day and torsemide was increased for few days. Overall feeling much better. Denies SOB/PND/Orthopnea. Able to walk 1-1 1/2 miles per day. He has been active going to church. Appetite ok. No fever or chills. Weight at home has been stable. Taking all medications. Lives with his daughter.    Labs (7/20): creatinine 2.29 => 1.97, K 3.5, hgb 11.6.  Labs (10/20): Creatinine 2.0 Labs (11/20): creatinine 2.5 Labs (1/21): K 3.7, creatinine 2.28 Labs (5/21): K 4, creatinine 2.33 Labs (8/21): K 3.9, creatinine 2.5   PMH: 1. CKD: Stage 3.  2. Hyperlipidemia: Statin intolerant.  3. HTN 4. Smoldering myeloma: Diagnosed in 7/20.  - Bone marrow biopsy stained negative for amyloidosis.  5.  Prostate cancer s/p XRT.  6. Type 2 diabetes.  7. Chronic systolic CHF: Nonischemic cardiomyopathy.  - RHC/LHC (2/20): Nonobstructive CAD with 50% pLAD; mean RA 13, PA 72/27 mean 47, mean PCWP 29, CI 2.8, PVR 3 WU - Echo (2/20): EF 30-35%.  - RHC (3/20): mean RA 9, PA 55/20 mean 32, mean PCWP 20, CI 3.29, PVR 1.7 WU - Echo (7/20): EF 25-30%, moderate LV dilation without LVH, normal RV size and systolic function, mild-moderate AI.  - Cardiolite (2/21): EF 36%, fixed anteroapical defect, no ischemia.  - Echo (11/21): EF 30-35%, diffuse hypokinesis, normal RV, PASP 45 mmHg, dilated IVC.  8. Pulmonary venous hypertension.  - V/Q scan (3/20): No evidence for chronic PE.  9. OSA: Using CPAP.   Past Surgical History:  Procedure Laterality Date  . RIGHT HEART CATH N/A 07/10/2018   Procedure: RIGHT HEART CATH;  Surgeon: Larey Dresser, MD;  Location: [facility];  Service: Cardiovascular;  Laterality: N/A;  . RIGHT/LEFT HEART CATH AND CORONARY ANGIOGRAPHY N/A 06/18/2018   Procedure: RIGHT/LEFT HEART CATH AND CORONARY ANGIOGRAPHY;  Surgeon: Troy Sine, MD;  Location: [facility];  Service: Cardiovascular;  Laterality: N/A;     Current Outpatient Medications  Medication Sig Dispense Refill  . aspirin EC 81 MG tablet Take 81 mg by mouth every evening.     . carvedilol (COREG) 25 MG tablet Take 1 tablet (25 mg total) by mouth 2 (two) times daily. 60 tablet 3  . Cholecalciferol (VITAMIN D3 PO) Take  1 tablet by mouth daily.     . cyanocobalamin 100 MCG tablet Take 100 mcg by mouth daily.     Marland Kitchen glipiZIDE (GLUCOTROL) 10 MG tablet Take 10 mg by mouth daily before breakfast.    . hydrALAZINE (APRESOLINE) 50 MG tablet Take 1 tablet (50 mg total) by mouth 3 (three) times daily. 90 tablet 3  . Multiple Vitamin (MULTIVITAMIN WITH MINERALS) TABS tablet Take 1 tablet by mouth 2 (two) times daily.    Marland Kitchen NIFEdipine (ADALAT CC) 30 MG 24 hr tablet Take by mouth.    Marland Kitchen PRECISION XTRA TEST STRIPS  test strip     . spironolactone (ALDACTONE) 25 MG tablet Take 1 tablet (25 mg total) by mouth at bedtime. 30 tablet 11  . tadalafil (CIALIS) 5 MG tablet Take 5 mg by mouth every 3 (three) days.    . tamsulosin (FLOMAX) 0.4 MG CAPS capsule Take 0.4 mg by mouth 2 (two) times daily.     Marland Kitchen torsemide (DEMADEX) 20 MG tablet Take 3 tablets (60 mg total) by mouth in the morning AND 2 tablets (40 mg total) daily after supper. 150 tablet 3  . triamcinolone ointment (KENALOG) 0.1 % SMARTSIG:Sparingly Topical Twice Daily     No current facility-administered medications for this encounter.    Allergies:   Tramadol, Finasteride, Lisinopril, Ciprofloxacin, Simvastatin, and Sulfa antibiotics   Social History:  The patient  reports that he has quit smoking. He has never used smokeless tobacco. He reports that he does not drink alcohol and does not use drugs.   Family History:  The patient's family history includes Cancer in his mother and sister; Congestive Heart Failure in his brother and father.   ROS:  Please see the history of present illness.   All other systems are personally reviewed and negative.  Vitals:   03/23/20 1052  BP: 132/83  Pulse: 67  SpO2: 95%    Exam:   BP 132/83   Pulse 67   Wt 100.8 kg (222 lb 3.2 oz)   SpO2 95%   BMI 34.80 kg/m  .npr  Recent Labs: 09/18/2019: B Natriuretic Peptide 139.0 12/02/2019: ALT 13; Hemoglobin 10.8; Platelets 130 03/03/2020: BUN 35; Creatinine, Ser 2.25; Potassium 3.7; Sodium 138  Personally reviewed   Wt Readings from Last 3 Encounters:  03/23/20 100.8 kg (222 lb 3.2 oz)  02/23/20 101.8 kg (224 lb 6.4 oz)  12/22/19 99.8 kg (220 lb)    General:  Well appearing. No resp difficulty HEENT: normal Neck: supple. no JVD. Carotids 2+ bilat; no bruits. No lymphadenopathy or thryomegaly appreciated. Cor: PMI nondisplaced. Regular rate & rhythm. No rubs, gallops or murmurs. Lungs: clear Abdomen: soft, nontender, nondistended. No hepatosplenomegaly.  No bruits or masses. Good bowel sounds. Extremities: no cyanosis, clubbing, rash, edema Neuro: alert & orientedx3, cranial nerves grossly intact. moves all 4 extremities w/o difficulty. Affect pleasant  ASSESSMENT AND PLAN:  1. Chronic systolic HF:  New diagnosis 05/2018, EF 30-35% by echo.Due to NICM. LHC 05/2018 with mild non-osbtructive CAD (50% proximal LAD stenosis).Has had high BP for a long time, but it has generally been controlled recently. Had palpitations in past, but very few PVCs on telemetry when in the hospital. Prior hx of ETOH abuse, but none in 40+ years. Did not receive chemotherapy with prior prostate cancer. QRS is wide on EKG, but it is RBBB. He has multiple myeloma but he does not have ventricular wall thickening that would be suggestive of cardiac amyloidosis.  [place] 3/20 with mildly  elevated filling pressures, CO preserved, pulmonary venous HTN. No PAH. Echo in 7/20 showed that EF remains low at 25-30% with moderate LV dilation, no LVH, and normal RV.  Most recent ECHO showed EF 30-35%, diffuse hypokinesis, normal RV, PASP 45 mmHg, dilated IVC.  This is complicated by CKD stage 3. Not candidate for ICD with myeloma/age.  - NYHA II. Volume status stable. Continue torsemide 60 mg /40 mg daily. Renal function stable.   - No ACE/ARB/ARNI with CKD stage 3.  - Continue  Coreg to 25 mg bid.  - Continue hydralazine 50 mg tid.  He is now off Imdur because he is on tadalafil for urine flow.  Ideally would be on Imdur, will restart if he is able to stop tadalafil in the future.  -Continuespironolactone 25 mg qhs  -  With advanced age and nonischemic etiology of cardiomyopathy, would avoid ICD.  He has RBBB so would not likely benefit from CRT.  2. CKD Stage 3: Most recent creatinine stable 2.25 - Sees nephrology.  3. CAD: 50% pLAD on LHC in 2/20. Cardiolite in 2/21 showed no ischemia.   - He has not been able to tolerate statins but LDL has been low. No chest  [place]pin.  -ContinueASA  81 mg daily.  4. Severe OSA: Continue CPAP.  5. Pulmonary hypertension: He had pulmonary venous hypertension based on RHC.  6. Smoldering myeloma:  No plans for chemotherapy treatment for now.    Follow up in 3 months with Dr Aundra Dubin.    Jeanmarie Hubert, NP  03/23/2020  [facility] [address] [facility] and [place] [address] [phone] (office) [phone] (fax)

## 2020-03-26 DIAGNOSIS — E1169 Type 2 diabetes mellitus with other specified complication: Secondary | ICD-10-CM | POA: Diagnosis not present

## 2020-03-26 DIAGNOSIS — Z79899 Other long term (current) drug therapy: Secondary | ICD-10-CM | POA: Diagnosis not present

## 2020-04-06 DIAGNOSIS — E7800 Pure hypercholesterolemia, unspecified: Secondary | ICD-10-CM | POA: Diagnosis not present

## 2020-04-06 DIAGNOSIS — E1169 Type 2 diabetes mellitus with other specified complication: Secondary | ICD-10-CM | POA: Diagnosis not present

## 2020-04-07 DIAGNOSIS — L298 Other pruritus: Secondary | ICD-10-CM | POA: Diagnosis not present

## 2020-04-07 DIAGNOSIS — L280 Lichen simplex chronicus: Secondary | ICD-10-CM | POA: Diagnosis not present

## 2020-04-22 DIAGNOSIS — H35372 Puckering of macula, left eye: Secondary | ICD-10-CM | POA: Diagnosis not present

## 2020-04-22 DIAGNOSIS — E113593 Type 2 diabetes mellitus with proliferative diabetic retinopathy without macular edema, bilateral: Secondary | ICD-10-CM | POA: Diagnosis not present

## 2020-04-26 DIAGNOSIS — H6123 Impacted cerumen, bilateral: Secondary | ICD-10-CM | POA: Diagnosis not present

## 2020-05-27 DIAGNOSIS — E7800 Pure hypercholesterolemia, unspecified: Secondary | ICD-10-CM | POA: Diagnosis not present

## 2020-05-27 DIAGNOSIS — I10 Essential (primary) hypertension: Secondary | ICD-10-CM | POA: Diagnosis not present

## 2020-05-27 DIAGNOSIS — M79605 Pain in left leg: Secondary | ICD-10-CM | POA: Diagnosis not present

## 2020-05-27 DIAGNOSIS — E119 Type 2 diabetes mellitus without complications: Secondary | ICD-10-CM | POA: Diagnosis not present

## 2020-05-27 DIAGNOSIS — N184 Chronic kidney disease, stage 4 (severe): Secondary | ICD-10-CM | POA: Diagnosis not present

## 2020-05-27 DIAGNOSIS — Z79899 Other long term (current) drug therapy: Secondary | ICD-10-CM | POA: Diagnosis not present

## 2020-05-27 DIAGNOSIS — I5022 Chronic systolic (congestive) heart failure: Secondary | ICD-10-CM | POA: Diagnosis not present

## 2020-06-01 ENCOUNTER — Other Ambulatory Visit: Payer: Self-pay

## 2020-06-01 ENCOUNTER — Other Ambulatory Visit (HOSPITAL_COMMUNITY): Payer: Self-pay | Admitting: Family Medicine

## 2020-06-01 ENCOUNTER — Ambulatory Visit (HOSPITAL_COMMUNITY)
Admission: RE | Admit: 2020-06-01 | Discharge: 2020-06-01 | Disposition: A | Discharge status: Home / Self Care | Payer: Medicare Other | Source: Ambulatory Visit | Attending: Family Medicine | Admitting: Family Medicine

## 2020-06-01 DIAGNOSIS — R600 Localized edema: Secondary | ICD-10-CM

## 2020-06-10 DIAGNOSIS — Z79899 Other long term (current) drug therapy: Secondary | ICD-10-CM | POA: Diagnosis not present

## 2020-06-21 ENCOUNTER — Encounter: Payer: Self-pay | Admitting: Internal Medicine

## 2020-06-21 ENCOUNTER — Other Ambulatory Visit: Payer: Self-pay

## 2020-06-21 ENCOUNTER — Ambulatory Visit (INDEPENDENT_AMBULATORY_CARE_PROVIDER_SITE_OTHER): Payer: Medicare Other | Admitting: Internal Medicine

## 2020-06-21 VITALS — BP 140/72 | HR 71 | Ht 67.0 in | Wt 221.4 lb

## 2020-06-21 DIAGNOSIS — N184 Chronic kidney disease, stage 4 (severe): Secondary | ICD-10-CM

## 2020-06-21 DIAGNOSIS — E1122 Type 2 diabetes mellitus with diabetic chronic kidney disease: Secondary | ICD-10-CM | POA: Diagnosis not present

## 2020-06-21 DIAGNOSIS — E1159 Type 2 diabetes mellitus with other circulatory complications: Secondary | ICD-10-CM

## 2020-06-21 DIAGNOSIS — E1165 Type 2 diabetes mellitus with hyperglycemia: Secondary | ICD-10-CM | POA: Diagnosis not present

## 2020-06-21 LAB — POCT GLYCOSYLATED HEMOGLOBIN (HGB A1C): Hemoglobin A1C: 8.8 % — AB (ref 4.0–5.6)

## 2020-06-21 LAB — POCT GLUCOSE (DEVICE FOR HOME USE): POC Glucose: 191 mg/dl — AB (ref 70–99)

## 2020-06-21 MED ORDER — GLIPIZIDE 5 MG PO TABS: 5.0000 mg | ORAL_TABLET | Freq: Every day | ORAL | 3 refills | Status: DC

## 2020-06-21 MED ORDER — RYBELSUS 7 MG PO TABS: 7.0000 mg | ORAL_TABLET | Freq: Every day | ORAL | 3 refills | Status: DC

## 2020-06-21 MED ORDER — ONETOUCH VERIO VI STRP: 1.0000 | ORAL_STRIP | Freq: Every day | 3 refills | Status: DC

## 2020-06-21 NOTE — Patient Instructions (Addendum)
-   Decrease Glipizide 5 mg , 1 tablet before Breakfast  - Increase Rybelsus 7 mg , 1 tablet before Breakfast       HOW TO TREAT LOW BLOOD SUGARS (Blood sugar LESS THAN 70 MG/DL)  Please follow the RULE OF 15 for the treatment of hypoglycemia treatment (when your (blood sugars are less than 70 mg/dL)    STEP 1: Take 15 grams of carbohydrates when your blood sugar is low, which includes:   3-4 GLUCOSE TABS  OR  3-4 OZ OF JUICE OR REGULAR SODA OR  ONE TUBE OF GLUCOSE GEL     STEP 2: RECHECK blood sugar in 15 MINUTES STEP 3: If your blood sugar is still low at the 15 minute recheck --> then, go back to STEP 1 and treat AGAIN with another 15 grams of carbohydrates.

## 2020-06-21 NOTE — Progress Notes (Signed)
Name: Allen Dyer  MRN/ DOB: [id number], 21-Dec-1935   Age/ Sex: 85 y.o., male    PCP: [name], MD   Reason for Endocrinology Evaluation: Type 2 Diabetes Mellitus     Date of Initial Endocrinology Visit: 06/21/2020     PATIENT IDENTIFIER: Allen Dyer is a 85 y.o. male with a past medical history of T2DM, HTN, neuropathy, CHF (04/2018) , monoclonal gammopathy and OSA. The patient presented for initial endocrinology clinic visit on 06/21/2020 for consultative assistance with his diabetes management.    HPI: Allen Dyer has requested a referral to endocrinology    Diagnosed with DM > 30 yrs  Prior Medications tried/Intolerance: started Rybelsus 05/2020 Currently checking blood sugars 1 x / day,  before breakfast.  Hypoglycemia episodes : yes            Symptoms: yes                Frequency: last episode was 2 yrs  Hemoglobin A1c has ranged from 6.6% in 2020, peaking at 8.1% . Patient required assistance for hypoglycemia: no  Patient has required hospitalization within the last 1 year from hyper or hypoglycemia: no   In terms of diet, the patient   Has been eating peanut butter and cracker around 11 pm due to hypoglycemia overnight   Received steroid injection 2.5 months ago   HOME DIABETES REGIMEN: Glipizide 10 mg daily Rybelsus 3 mg daily   Statin: no ACE-I/ARB: no Prior Diabetic Education: no    METER DOWNLOAD SUMMARY: Unable to download      DIABETIC COMPLICATIONS: Microvascular complications:   Neuropathy, CKD IV  Denies: retinopathy  Last eye exam: Completed   Macrovascular complications:   CHF  Denies: CAD, PVD, CVA   PAST HISTORY: Past Medical History:  Past Medical History:  Diagnosis Date  . Chronic combined systolic and diastolic CHF (congestive heart failure) ([place])   . CKD (chronic kidney disease), stage III ([place])   . Hyperlipidemia   . Hypertension   . Lower extremity edema   . Mild CAD    a. mild-mod by cath 05/2018.  .  Multiple myeloma ([place]) 11/15/2018  . NICM (nonischemic cardiomyopathy) ([place])   . Peripheral neuropathy   . Prostate cancer ([place])    Status post XRT  . Rheumatic fever   . Trifascicular block   . Type 2 diabetes mellitus ([place])    Past Surgical History:  Past Surgical History:  Procedure Laterality Date  . RIGHT HEART CATH N/A 07/10/2018   Procedure: RIGHT HEART CATH;  Surgeon: Larey Dresser, MD;  Location: [facility];  Service: Cardiovascular;  Laterality: N/A;  . RIGHT/LEFT HEART CATH AND CORONARY ANGIOGRAPHY N/A 06/18/2018   Procedure: RIGHT/LEFT HEART CATH AND CORONARY ANGIOGRAPHY;  Surgeon: Troy Sine, MD;  Location: [facility];  Service: Cardiovascular;  Laterality: N/A;      Social History:  reports that he has quit smoking. He has never used smokeless tobacco. He reports that he does not drink alcohol and does not use drugs. Family History:  Family History  Problem Relation Age of Onset  . Congestive Heart Failure Father        died from heart failure at the age of 53  . Cancer Mother   . Cancer Sister   . Congestive Heart Failure Brother        died from heart failure at the age of 42     HOME MEDICATIONS: Allergies as of 06/21/2020  Reactions   Tramadol Other (See Comments)   Dizziness (intolerance) hallucinate   Finasteride Swelling   Breast enlargement   Lisinopril Cough   Ciprofloxacin Other (See Comments)   Dizziness (intolerance)   Simvastatin Hives, Other (See Comments)   Blisters/ Rash   Sulfa Antibiotics Rash      Medication List       Accurate as of June 21, 2020  1:56 PM. If you have any questions, ask your nurse or doctor.        aspirin EC 81 MG tablet Take 81 mg by mouth every evening.   carvedilol 25 MG tablet Commonly known as: COREG Take 1 tablet (25 mg total) by mouth 2 (two) times daily.   cyanocobalamin 100 MCG tablet Take 100 mcg by mouth daily.   glipiZIDE 10 MG tablet Commonly known as:  GLUCOTROL Take 10 mg by mouth daily before breakfast.   hydrALAZINE 50 MG tablet Commonly known as: APRESOLINE Take 1 tablet (50 mg total) by mouth 3 (three) times daily.   multivitamin with minerals Tabs tablet Take 1 tablet by mouth 2 (two) times daily.   NIFEdipine 30 MG 24 hr tablet Commonly known as: ADALAT CC Take by mouth.   PRECISION XTRA TEST STRIPS test strip Generic drug: glucose blood   Rybelsus 3 MG Tabs Generic drug: Semaglutide Take by mouth daily.   spironolactone 25 MG tablet Commonly known as: ALDACTONE Take 1 tablet (25 mg total) by mouth at bedtime.   tadalafil 5 MG tablet Commonly known as: CIALIS Take 5 mg by mouth every 3 (three) days.   tamsulosin 0.4 MG Caps capsule Commonly known as: FLOMAX Take 0.4 mg by mouth 2 (two) times daily.   torsemide 20 MG tablet Commonly known as: DEMADEX Take 3 tablets (60 mg total) by mouth in the morning AND 2 tablets (40 mg total) daily after supper.   triamcinolone ointment 0.1 % Commonly known as: KENALOG SMARTSIG:Sparingly Topical Twice Daily   VITAMIN D3 PO Take 1 tablet by mouth daily.        ALLERGIES: Allergies  Allergen Reactions  . Tramadol Other (See Comments)    Dizziness (intolerance) hallucinate  . Finasteride Swelling    Breast enlargement   . Lisinopril Cough  . Ciprofloxacin Other (See Comments)    Dizziness (intolerance)   . Simvastatin Hives and Other (See Comments)    Blisters/ Rash   . Sulfa Antibiotics Rash     REVIEW OF SYSTEMS: A comprehensive ROS was conducted with the patient and is negative except as per HPI and below:  Review of Systems  Gastrointestinal: Negative for diarrhea and nausea.  Endo/Heme/Allergies: Negative for polydipsia.      OBJECTIVE:   VITAL SIGNS: BP 140/72   Pulse 71   Ht 5' 7" (1.702 m)   Wt 221 lb 6 oz (100.4 kg)   SpO2 98%   BMI 34.67 kg/m    PHYSICAL EXAM:  General: Pt appears well and is in NAD  Neck: General: Supple  without adenopathy or carotid bruits. Thyroid: Thyroid size normal.  No goiter or nodules appreciated. No thyroid bruit.  Lungs: Clear with good BS bilat with no rales, rhonchi, or wheezes  Heart: RRR with normal S1 and S2 and no gallops; no murmurs; no rub  Abdomen: Normoactive bowel sounds, soft, nontender, without masses or organomegaly palpable  Extremities:  Lower extremities - No pretibial edema. No lesions.  Skin: Normal texture and temperature to palpation. No rash noted. No Acanthosis nigricans/skin tags.  No lipohypertrophy.  Neuro: MS is good with appropriate affect, pt is alert and Ox3    DM foot exam:    DATA REVIEWED:  Lab Results  Component Value Date   HGBA1C 8.8 (A) 06/21/2020   HGBA1C 6.6 (H) 07/10/2018   Lab Results  Component Value Date   LDLCALC 62 07/10/2018   CREATININE 2.25 (H) 03/03/2020   No results found for: [facility]  Lab Results  Component Value Date   CHOL 108 07/10/2018   HDL 32 (L) 07/10/2018   LDLCALC 62 07/10/2018   TRIG 68 07/10/2018   CHOLHDL 3.4 07/10/2018        ASSESSMENT / PLAN / RECOMMENDATIONS:   1) Type 2 Diabetes Mellitus, Poorly controlled, With CKD IV complications - Most recent A1c of 8.8 %. Goal A1c < 7.0 %.    - Pt has endorsed hypoglycemia in the past, these apparently used to happen overnight , he has resorted to eating peanut butter and crackers at bedtime to prevent hypoglycemia.  - We discussed our limitations in oral glycemic agents given kidney disease , we discussed risk of hypoglycemia with glipizide . - I am going to reduce Glipizide and he was advised to stop the bedtime snacks , otherwise he will end up with morning hyperglycemia - He is currently on Rybelsus and we have discussed the benefits of this, currently $38 . I have advised him to let me know if he gets in the donut hole , he may qualify for pt assistance program. [facility] - He was provided with onetouch verio  meter, as we were unable to download his current one    MEDICATIONS: - Decrease Glipizide 5 mg , 1 tablet before Breakfast  - Increase Rybelsus 7 mg , 1 tablet before Breakfast    EDUCATION / INSTRUCTIONS:  BG monitoring instructions: Patient is instructed to check his blood sugars 1 times a day, alternating between fasting and supper time.  Call [facility] if: BG persistently < 70  . I reviewed the Rule of 15 for the treatment of hypoglycemia in detail with the patient. Literature supplied.   2) Diabetic complications:   Eye: Does not have known diabetic retinopathy.   Neuro/ Feet: Does not have known diabetic peripheral neuropathy.  Renal: Patient does  have known baseline CKD. He is not on an ACEI/ARB at present.     F/U in 3 months     Signed electronically by: Mack Guise, MD  [facility]  [address] [place], [place] [address] Phone: [phone] FAX: [phone]   CC: [name], [address] [place] [address] Phone: [phone]  Fax: [phone]    Return to Endocrinology clinic as below: Future Appointments  Date Time Provider [place]  06/22/2020  1:45 PM Gardiner Barefoot, DPM [facility]  07/12/2020 11:00 AM [facility] None  07/19/2020 11:00 AM Heath Lark, MD [facility] None

## 2020-06-22 ENCOUNTER — Encounter: Payer: Self-pay | Admitting: Podiatry

## 2020-06-22 ENCOUNTER — Ambulatory Visit (INDEPENDENT_AMBULATORY_CARE_PROVIDER_SITE_OTHER): Payer: Medicare Other | Admitting: Podiatry

## 2020-06-22 DIAGNOSIS — E1142 Type 2 diabetes mellitus with diabetic polyneuropathy: Secondary | ICD-10-CM

## 2020-06-22 DIAGNOSIS — M79675 Pain in left toe(s): Secondary | ICD-10-CM

## 2020-06-22 DIAGNOSIS — N183 Chronic kidney disease, stage 3 unspecified: Secondary | ICD-10-CM

## 2020-06-22 DIAGNOSIS — B351 Tinea unguium: Secondary | ICD-10-CM | POA: Diagnosis not present

## 2020-06-22 DIAGNOSIS — M79674 Pain in right toe(s): Secondary | ICD-10-CM

## 2020-06-22 NOTE — Progress Notes (Signed)
This patient returns to my office for at risk foot care.  This patient requires this care by a professional since this patient will be at risk due to having  Diabetes and chronic kidney disease.   This patient is unable to cut nails himself  since the patient cannot reach his  nails.These nails are painful walking and wearing shoes.  This patient presents for at risk foot care today.    General Appearance  Alert, conversant and in no acute stress.  Vascular  Dorsalis pedis and posterior tibial  pulses are palpable  bilaterally.  Capillary return is within normal limits  bilaterally. Temperature is within normal limits  bilaterally.  Neurologic  Senn-Weinstein monofilament wire test diminished   bilaterally. Muscle power within normal limits bilaterally.  Nails Thick disfigured discolored nails with subungual debris  from hallux to fifth toes bilaterally. No evidence of bacterial infection or drainage bilaterally.  Orthopedic  No limitations of motion  feet .  No crepitus or effusions noted.  No bony pathology or digital deformities noted.  Arthritis right foot/ankle.  Skin  normotropic skin with no porokeratosis noted bilaterally.  No signs of infections or ulcers noted.   Asymptomatic porokeratosis sub 5th  B/L.  Onychomycosis  Pain in right toes  Pain in left toes  Consent was obtained for treatment procedures.   Mechanical debridement of nails 1-5  bilaterally performed with a nail nipper.  Filed with dremel without incident. No infection or ulcer.     Return office visit   3 months       Told patient to return for periodic foot care and evaluation due to potential at risk complications.   Phineas Mcenroe DPM  

## 2020-07-06 DIAGNOSIS — L280 Lichen simplex chronicus: Secondary | ICD-10-CM | POA: Diagnosis not present

## 2020-07-06 DIAGNOSIS — I8312 Varicose veins of left lower extremity with inflammation: Secondary | ICD-10-CM | POA: Diagnosis not present

## 2020-07-06 DIAGNOSIS — L810 Postinflammatory hyperpigmentation: Secondary | ICD-10-CM | POA: Diagnosis not present

## 2020-07-12 ENCOUNTER — Other Ambulatory Visit: Payer: Self-pay

## 2020-07-12 ENCOUNTER — Inpatient Hospital Stay: Payer: Medicare Other | Attending: Hematology and Oncology

## 2020-07-12 DIAGNOSIS — C9000 Multiple myeloma not having achieved remission: Secondary | ICD-10-CM | POA: Insufficient documentation

## 2020-07-12 DIAGNOSIS — N184 Chronic kidney disease, stage 4 (severe): Secondary | ICD-10-CM | POA: Diagnosis not present

## 2020-07-12 DIAGNOSIS — E1122 Type 2 diabetes mellitus with diabetic chronic kidney disease: Secondary | ICD-10-CM | POA: Insufficient documentation

## 2020-07-12 DIAGNOSIS — D631 Anemia in chronic kidney disease: Secondary | ICD-10-CM | POA: Insufficient documentation

## 2020-07-12 DIAGNOSIS — D539 Nutritional anemia, unspecified: Secondary | ICD-10-CM | POA: Diagnosis not present

## 2020-07-12 DIAGNOSIS — N183 Chronic kidney disease, stage 3 unspecified: Secondary | ICD-10-CM

## 2020-07-12 LAB — COMPREHENSIVE METABOLIC PANEL
ALT: 14 U/L (ref 0–44)
AST: 17 U/L (ref 15–41)
Albumin: 3.5 g/dL (ref 3.5–5.0)
Alkaline Phosphatase: 68 U/L (ref 38–126)
Anion gap: 6 (ref 5–15)
BUN: 21 mg/dL (ref 8–23)
CO2: 28 mmol/L (ref 22–32)
Calcium: 8.8 mg/dL — ABNORMAL LOW (ref 8.9–10.3)
Chloride: 105 mmol/L (ref 98–111)
Creatinine, Ser: 2.22 mg/dL — ABNORMAL HIGH (ref 0.61–1.24)
GFR, Estimated: 29 mL/min — ABNORMAL LOW (ref >60)
Glucose, Bld: 202 mg/dL — ABNORMAL HIGH (ref 70–99)
Potassium: 4 mmol/L (ref 3.5–5.1)
Sodium: 139 mmol/L (ref 135–145)
Total Bilirubin: 0.5 mg/dL (ref 0.3–1.2)
Total Protein: 7.1 g/dL (ref 6.5–8.1)

## 2020-07-12 LAB — CBC WITH DIFFERENTIAL/PLATELET
Abs Immature Granulocytes: 0.01 10*3/uL (ref 0.00–0.07)
Basophils Absolute: 0 10*3/uL (ref 0.0–0.1)
Basophils Relative: 0 %
Eosinophils Absolute: 0.2 10*3/uL (ref 0.0–0.5)
Eosinophils Relative: 5 %
HCT: 33.2 % — ABNORMAL LOW (ref 39.0–52.0)
Hemoglobin: 10.8 g/dL — ABNORMAL LOW (ref 13.0–17.0)
Immature Granulocytes: 0 %
Lymphocytes Relative: 26 %
Lymphs Abs: 1 10*3/uL (ref 0.7–4.0)
MCH: 33 pg (ref 26.0–34.0)
MCHC: 32.5 g/dL (ref 30.0–36.0)
MCV: 101.5 fL — ABNORMAL HIGH (ref 80.0–100.0)
Monocytes Absolute: 0.5 10*3/uL (ref 0.1–1.0)
Monocytes Relative: 12 %
Neutro Abs: 2.3 10*3/uL (ref 1.7–7.7)
Neutrophils Relative %: 57 %
Platelets: 152 10*3/uL (ref 150–400)
RBC: 3.27 MIL/uL — ABNORMAL LOW (ref 4.22–5.81)
RDW: 13.4 % (ref 11.5–15.5)
WBC: 4.1 10*3/uL (ref 4.0–10.5)
nRBC: 0 % (ref 0.0–0.2)

## 2020-07-12 LAB — IRON AND TIBC
Iron: 83 ug/dL (ref 42–163)
Saturation Ratios: 33 % (ref 20–55)
TIBC: 248 ug/dL (ref 202–409)
UIBC: 165 ug/dL (ref 117–376)

## 2020-07-12 LAB — FERRITIN: Ferritin: 96 ng/mL (ref 24–336)

## 2020-07-12 LAB — SEDIMENTATION RATE: Sed Rate: 45 mm/hr — ABNORMAL HIGH (ref 0–16)

## 2020-07-12 LAB — VITAMIN B12: Vitamin B-12: 741 pg/mL (ref 180–914)

## 2020-07-13 LAB — KAPPA/LAMBDA LIGHT CHAINS
Kappa free light chain: 136.9 mg/L — ABNORMAL HIGH (ref 3.3–19.4)
Kappa, lambda light chain ratio: 5.59 — ABNORMAL HIGH (ref 0.26–1.65)
Lambda free light chains: 24.5 mg/L (ref 5.7–26.3)

## 2020-07-13 IMAGING — NM NM PULMONARY VENTILATION AND PERFUSION SCAN
16 series · 16 of 16 positions shown · non-contrast
Comparison: Chest radiograph July 09, 2018

CLINICAL DATA: Shortness of breath

EXAM:
NUCLEAR MEDICINE VENTILATION - PERFUSION LUNG SCAN
VIEWS:
Anterior, posterior, left lateral, right lateral, RPO, LPO, RAO,
LAO-ventilation and perfusion
RADIOPHARMACEUTICALS:  32.0 mCi of 2c-88m DTPA aerosol inhalation
and 4.2 mCi Gc99m MAA IV

[Series 1: ant/post vent · 4.14mm/px · 1 of 1 slices shown (1 of 2)]
[im 1/1]
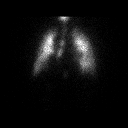

[Series 1: ant/post vent · 4.14mm/px · 1 of 1 slices shown (2 of 2)]
[im 1/1]
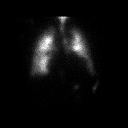

[Series 2: lao/rpo vent · 4.14mm/px · 1 of 1 slices shown (1 of 2)]
[im 1/1]
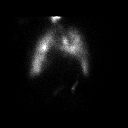

[Series 2: lao/rpo vent · 4.14mm/px · 1 of 1 slices shown (2 of 2)]
[im 1/1]
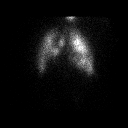

[Series 3: lpo/rao vent · 4.14mm/px · 1 of 1 slices shown (1 of 2)]
[im 1/1]
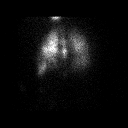

[Series 3: lpo/rao vent · 4.14mm/px · 1 of 1 slices shown (2 of 2)]
[im 1/1]
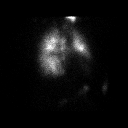

[Series 4: lt lat/rt lat vent · 4.14mm/px · 1 of 1 slices shown (1 of 2)]
[im 1/1]
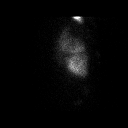

[Series 4: lt lat/rt lat vent · 4.14mm/px · 1 of 1 slices shown (2 of 2)]
[im 1/1]
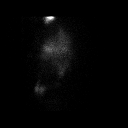

[Series 5: lt lat/rt lat perf · 4.14mm/px · 1 of 1 slices shown (1 of 2)]
[im 1/1]
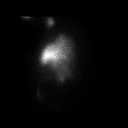

[Series 5: lt lat/rt lat perf · 4.14mm/px · 1 of 1 slices shown (2 of 2)]
[im 1/1]
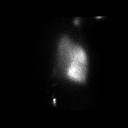

[Series 6: lpo/rao perf · 4.14mm/px · 1 of 1 slices shown (1 of 2)]
[im 1/1]
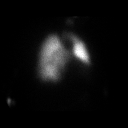

[Series 6: lpo/rao perf · 4.14mm/px · 1 of 1 slices shown (2 of 2)]
[im 1/1]
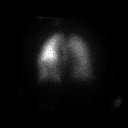

[Series 7: ant/post perf · 4.14mm/px · 1 of 1 slices shown (1 of 2)]
[im 1/1]
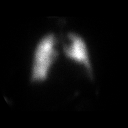

[Series 7: ant/post perf · 4.14mm/px · 1 of 1 slices shown (2 of 2)]
[im 1/1]
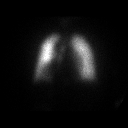

[Series 8: lao/rpo perf · 4.14mm/px · 1 of 1 slices shown (1 of 2)]
[im 1/1]
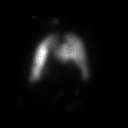

[Series 8: lao/rpo perf · 4.14mm/px · 1 of 1 slices shown (2 of 2)]
[im 1/1]
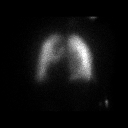

[16 of 16 positions shown; findings below may reference images not displayed]

FINDINGS: Ventilation: Radiotracer uptake is homogeneous and symmetric
bilaterally. No ventilation defects are evident. There is
cardiomegaly.

Perfusion: Radiotracer uptake is homogeneous and symmetric
bilaterally. No perfusion defects are evident. There is
cardiomegaly.
IMPRESSION: No appreciable ventilation or perfusion defects. Very low
probability of pulmonary embolus. Cardiomegaly noted.

## 2020-07-13 IMAGING — CR CHEST - 2 VIEW
2 series · 2 of 2 positions shown · non-contrast
Comparison: 07/04/2018

CLINICAL DATA: CHF

EXAM:
CHEST - 2 VIEW

[chest pa]
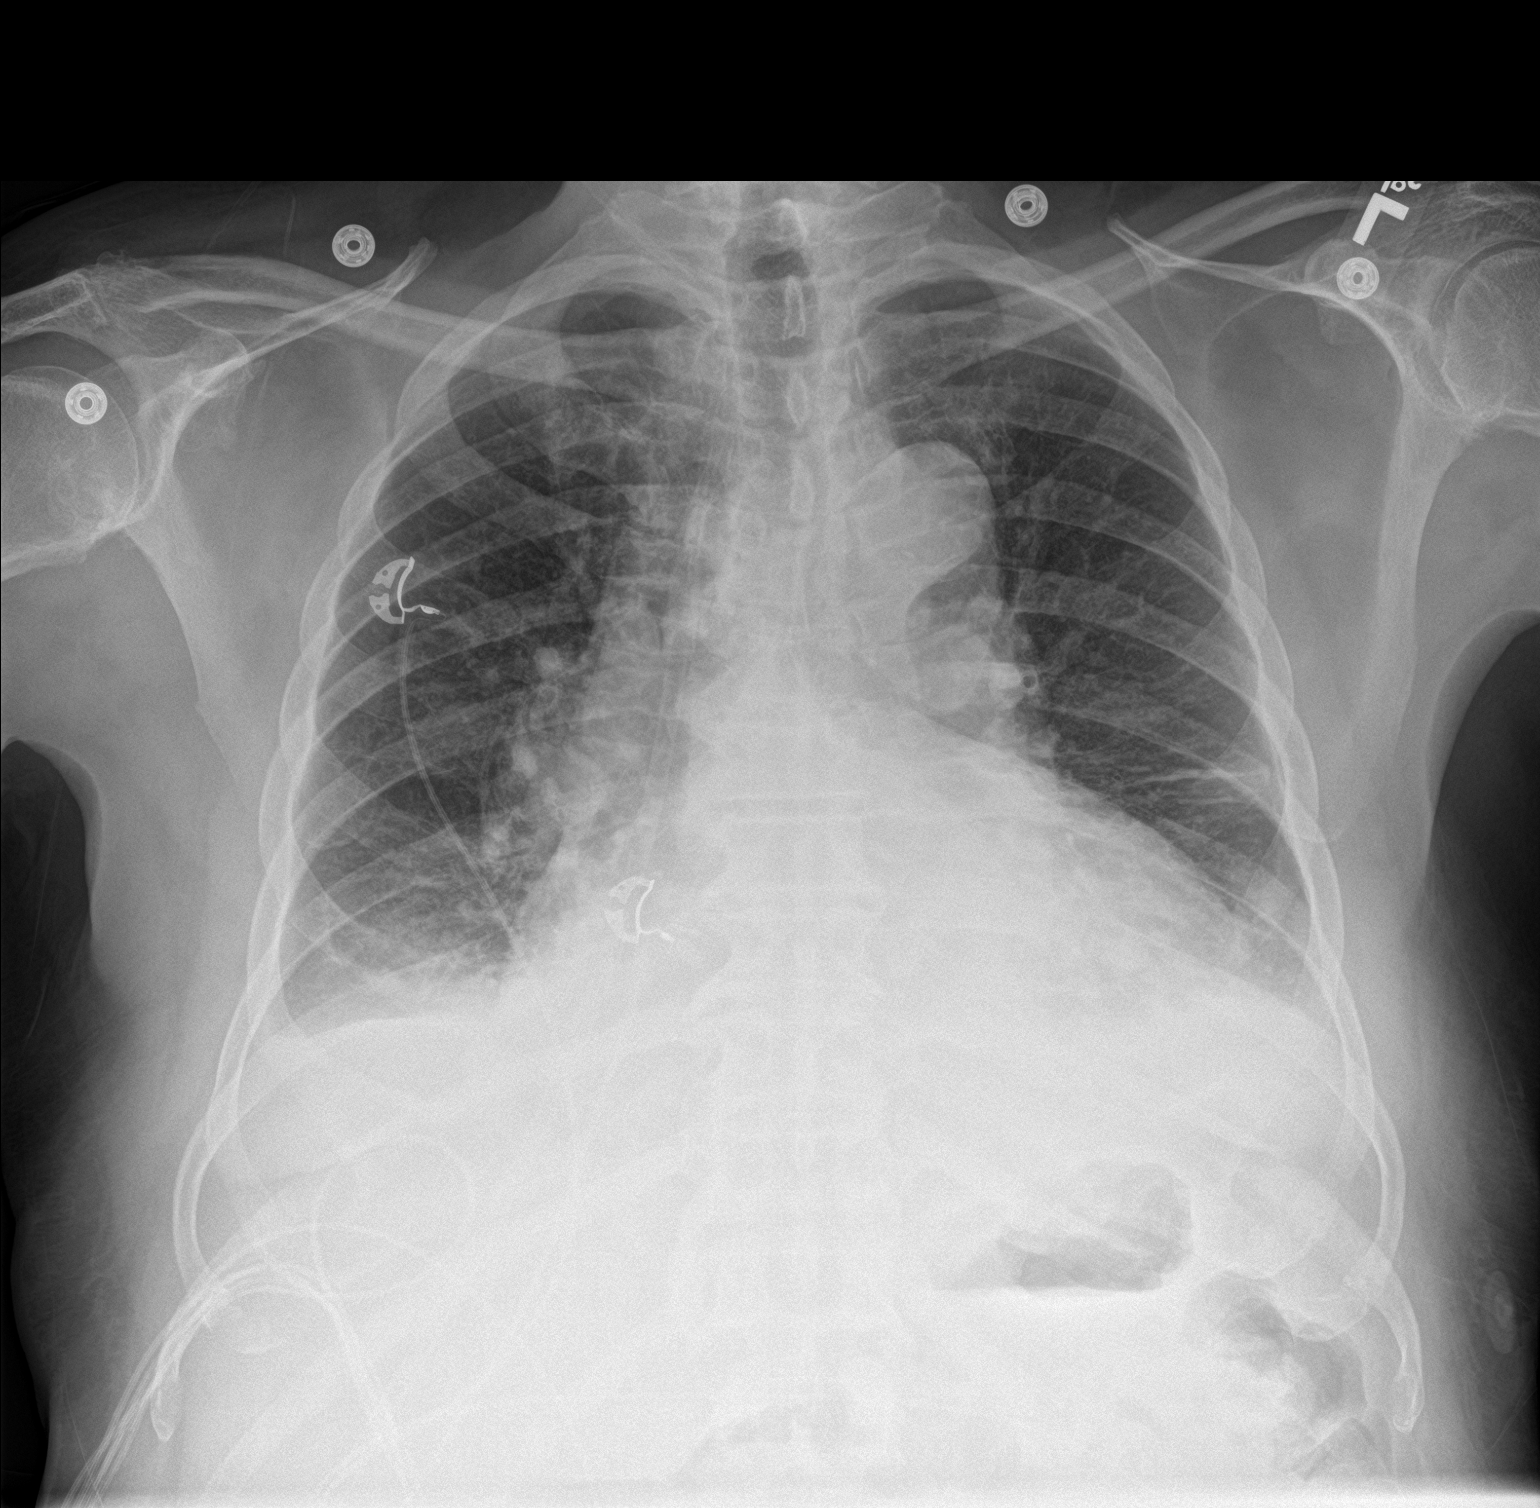

[chest lat]
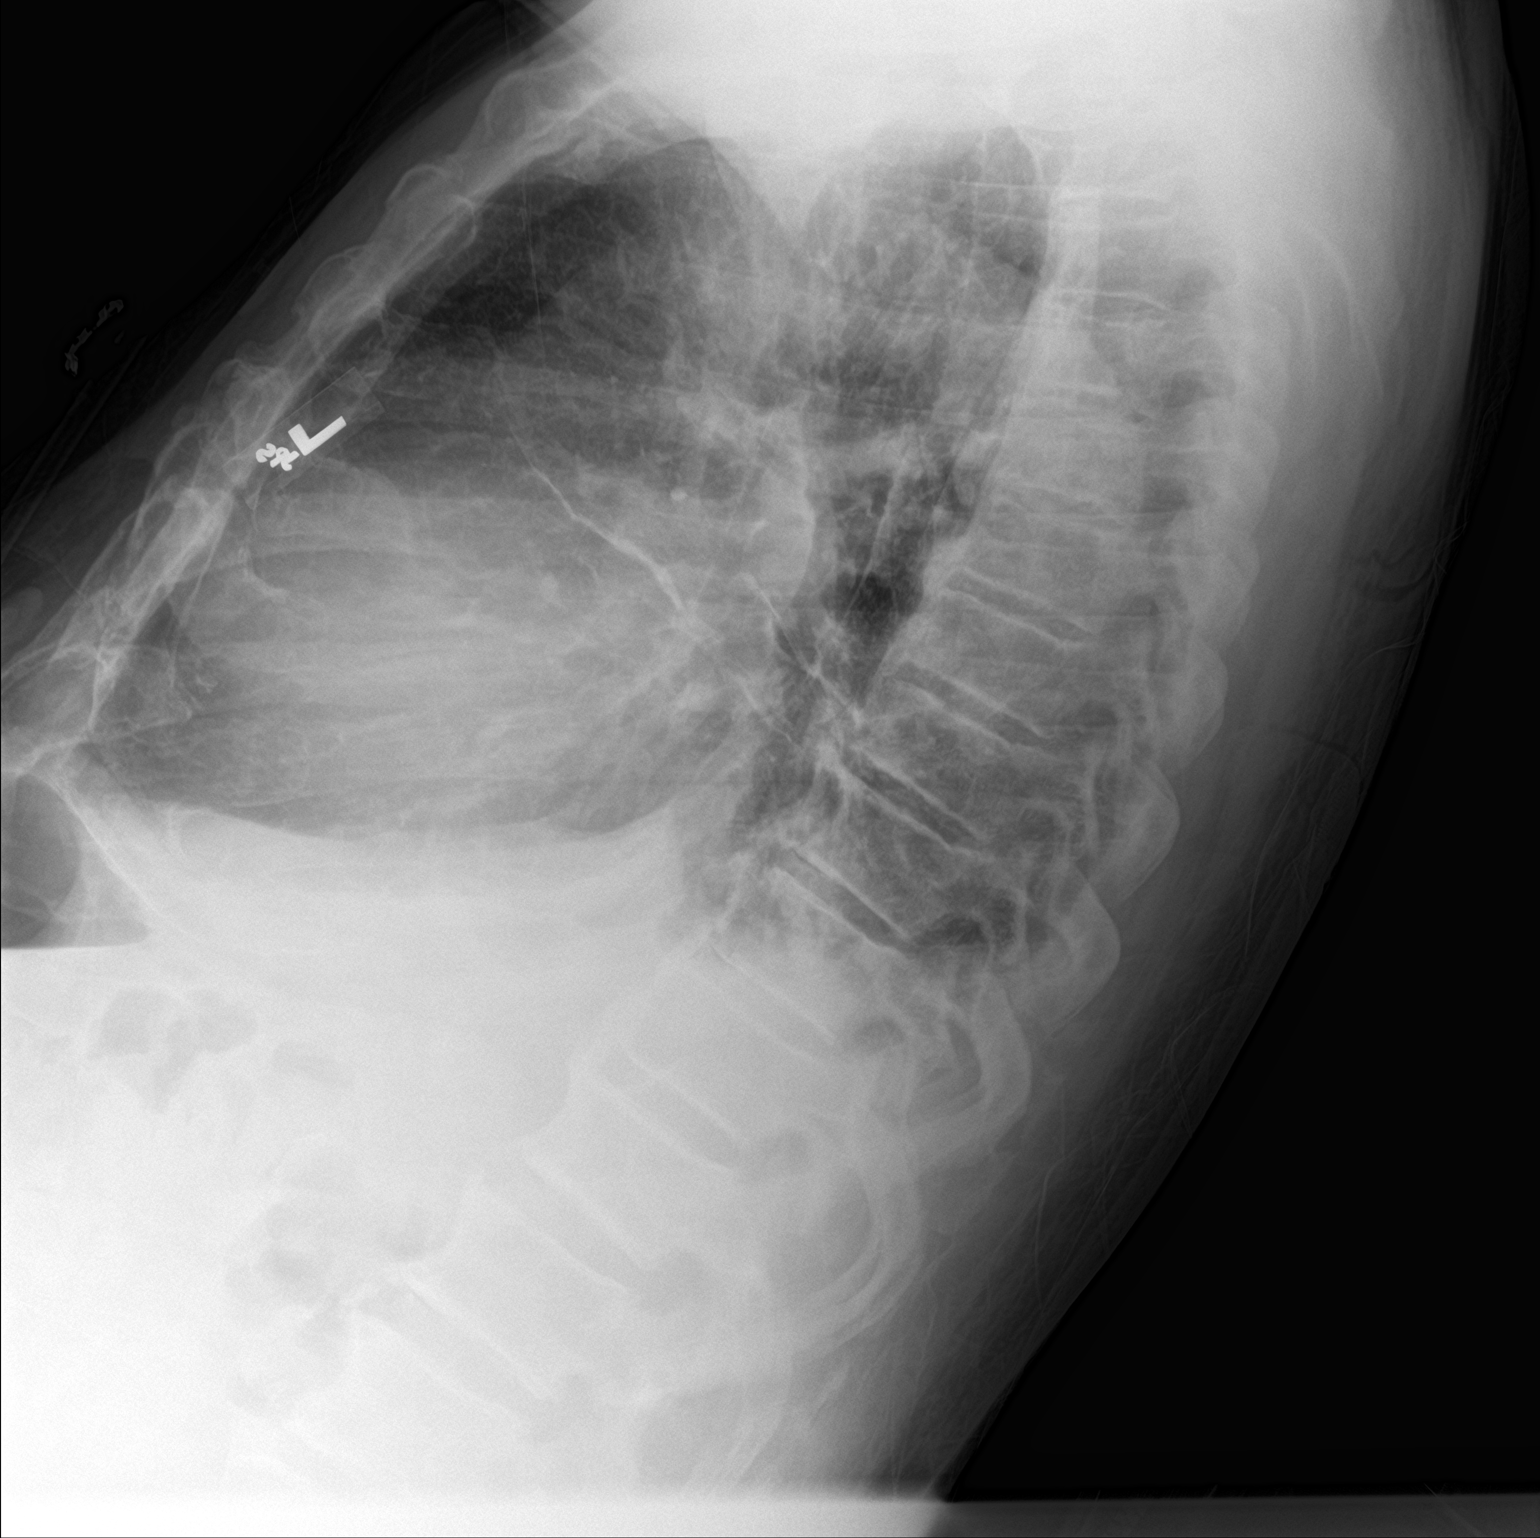

[2 of 2 positions shown; findings below may reference images not displayed]

FINDINGS: Bilateral diffuse interstitial thickening. Trace bilateral pleural
effusions. No focal consolidation or pneumothorax. Stable
cardiomegaly. No acute osseous abnormality.
IMPRESSION: 1. Mild CHF.

## 2020-07-14 LAB — MULTIPLE MYELOMA PANEL, SERUM
Albumin SerPl Elph-Mcnc: 3.5 g/dL (ref 2.9–4.4)
Albumin/Glob SerPl: 1.1 (ref 0.7–1.7)
Alpha 1: 0.2 g/dL (ref 0.0–0.4)
Alpha2 Glob SerPl Elph-Mcnc: 0.6 g/dL (ref 0.4–1.0)
B-Globulin SerPl Elph-Mcnc: 1.4 g/dL — ABNORMAL HIGH (ref 0.7–1.3)
Gamma Glob SerPl Elph-Mcnc: 1 g/dL (ref 0.4–1.8)
Globulin, Total: 3.2 g/dL (ref 2.2–3.9)
IgA: 705 mg/dL — ABNORMAL HIGH (ref 61–437)
IgG (Immunoglobin G), Serum: 1091 mg/dL (ref 603–1613)
IgM (Immunoglobulin M), Srm: 26 mg/dL (ref 15–143)
M Protein SerPl Elph-Mcnc: 0.6 g/dL — ABNORMAL HIGH
Total Protein ELP: 6.7 g/dL (ref 6.0–8.5)

## 2020-07-19 ENCOUNTER — Inpatient Hospital Stay (HOSPITAL_BASED_OUTPATIENT_CLINIC_OR_DEPARTMENT_OTHER): Payer: Medicare Other | Admitting: Hematology and Oncology

## 2020-07-19 ENCOUNTER — Other Ambulatory Visit: Payer: Self-pay

## 2020-07-19 ENCOUNTER — Encounter: Payer: Self-pay | Admitting: Hematology and Oncology

## 2020-07-19 DIAGNOSIS — C9000 Multiple myeloma not having achieved remission: Secondary | ICD-10-CM

## 2020-07-19 DIAGNOSIS — D539 Nutritional anemia, unspecified: Secondary | ICD-10-CM | POA: Diagnosis not present

## 2020-07-19 DIAGNOSIS — E1122 Type 2 diabetes mellitus with diabetic chronic kidney disease: Secondary | ICD-10-CM | POA: Diagnosis not present

## 2020-07-19 DIAGNOSIS — N184 Chronic kidney disease, stage 4 (severe): Secondary | ICD-10-CM

## 2020-07-19 DIAGNOSIS — D472 Monoclonal gammopathy: Secondary | ICD-10-CM

## 2020-07-19 DIAGNOSIS — D631 Anemia in chronic kidney disease: Secondary | ICD-10-CM | POA: Diagnosis not present

## 2020-07-19 NOTE — Assessment & Plan Note (Signed)
I reviewed his test result with the patient  Based on his blood work, the patient have MGUS His history of congestive heart failure and has longstanding history of diabetes both can contribute to elevated creatinine He has no signs of cancer progression For now, the patient has no symptoms I will see him again in 12 months for follow-up

## 2020-07-19 NOTE — Progress Notes (Signed)
[facility] OFFICE PROGRESS NOTE  Patient Care Team: Koirala, Dibas, MD as PCP - General (Family Medicine) [name], MD as PCP - Cardiology (Cardiology)  ASSESSMENT & PLAN:  Smoldering multiple myeloma ([place]) I reviewed his test result with the patient  Based on his blood work, the patient have MGUS His history of congestive heart failure and has longstanding history of diabetes both can contribute to elevated creatinine He has no signs of cancer progression For now, the patient has no symptoms I will see him again in 12 months for follow-up  Chronic kidney disease (CKD), stage IV (severe) ([place]) He has mild intermittent acute on chronic renal failure; The cause of renal failure is not from multiple myeloma, rather, related to his heart disease and diabetes This is likely the contributing factor to the elevated light chain We discussed the importance of getting his risk factors under control I also recommend the patient to hydrate adequately before each visit  Deficiency anemia The cause of his chronic anemia is due to chronic renal disease His recent vitamin B-12 and iron studies are adequate I do not recommend erythropoietin stimulating agent right now   Orders Placed This Encounter  Procedures  . CBC with Differential/Platelet    Standing Status:   Standing    Number of Occurrences:   22    Standing Expiration Date:   07/19/2021  . Comprehensive metabolic panel    Standing Status:   Standing    Number of Occurrences:   33    Standing Expiration Date:   07/19/2021  . Kappa/lambda light chains    Standing Status:   Standing    Number of Occurrences:   22    Standing Expiration Date:   07/19/2021  . Multiple Myeloma Panel (SPEP&IFE w/QIG)    Standing Status:   Standing    Number of Occurrences:   22    Standing Expiration Date:   07/19/2021    All questions were answered. The patient knows to call the clinic with any problems, questions or  concerns. The total time spent in the appointment was 20 minutes encounter with patients including review of chart and various tests results, discussions about plan of care and coordination of care plan   Heath Lark, MD 07/19/2020 11:01 AM  INTERVAL HISTORY: Please see below for problem oriented charting. He returns for follow-up on MGUS, renal failure and chronic anemia He feels well He has excellent control of his blood sugar recently He admits he is not drinking enough water No new bone pain no recent infection The patient denies any recent signs or symptoms of bleeding such as spontaneous epistaxis, hematuria or hematochezia.   SUMMARY OF ONCOLOGIC HISTORY: Oncology History  Smoldering multiple myeloma ([place])  10/17/2018 Imaging   1. Interim clearing of congestive heart failure. Persistent cardiomegaly.   2.  No evidence of metastatic disease or myeloma   11/04/2018 Bone Marrow Biopsy   Bone Marrow, Aspirate,Biopsy, and Clot, right ilium - PLASMA CELL NEOPLASM, SEE COMMENT. - ATYPICAL MEGAKARYOCYTES. PERIPHERAL BLOOD: - NORMOCYTIC ANEMIA. Diagnosis Note The marrow is mildly hypercellular for age with increased plasma cells (15% by CD138 immunohistochemistry). These findings are consistent with a plasma cell neoplasm. Correlation with clinical, radiographic, and laboratory data is required for further classification. There are a few atypical megakaryocytes the significance of which is unclear.  Del 13q noted but normal cytogenetics Congo red stain is negative for amyloid     REVIEW OF SYSTEMS:   Constitutional:  Denies fevers, chills or abnormal weight loss Eyes: Denies blurriness of vision Ears, nose, mouth, throat, and face: Denies mucositis or sore throat Respiratory: Denies cough, dyspnea or wheezes Cardiovascular: Denies palpitation, chest discomfort or lower extremity swelling Gastrointestinal:  Denies nausea, heartburn or change in bowel habits Skin: Denies abnormal  skin rashes Lymphatics: Denies new lymphadenopathy or easy bruising Neurological:Denies numbness, tingling or new weaknesses Behavioral/Psych: Mood is stable, no new changes  All other systems were reviewed with the patient and are negative.  I have reviewed the past medical history, past surgical history, social history and family history with the patient and they are unchanged from previous note.  ALLERGIES:  is allergic to tramadol, finasteride, lisinopril, ciprofloxacin, simvastatin, and sulfa antibiotics.  MEDICATIONS:  Current Outpatient Medications  Medication Sig Dispense Refill  . aspirin EC 81 MG tablet Take 81 mg by mouth every evening.     . carvedilol (COREG) 25 MG tablet Take 1 tablet (25 mg total) by mouth 2 (two) times daily. 60 tablet 3  . Cholecalciferol (VITAMIN D3 PO) Take 1 tablet by mouth daily.     . cyanocobalamin 100 MCG tablet Take 100 mcg by mouth daily.     Marland Kitchen glipiZIDE (GLUCOTROL) 5 MG tablet Take 1 tablet (5 mg total) by mouth daily before breakfast. 90 tablet 3  . glucose blood (ONETOUCH VERIO) test strip 1 each by Other route daily. Use as instructed 100 each 3  . hydrALAZINE (APRESOLINE) 50 MG tablet Take 1 tablet (50 mg total) by mouth 3 (three) times daily. 90 tablet 3  . Multiple Vitamin (MULTIVITAMIN WITH MINERALS) TABS tablet Take 1 tablet by mouth 2 (two) times daily.    Marland Kitchen NIFEdipine (ADALAT CC) 30 MG 24 hr tablet Take by mouth.    . Semaglutide (RYBELSUS) 7 MG TABS Take 7 mg by mouth daily before breakfast. 90 tablet 3  . spironolactone (ALDACTONE) 25 MG tablet Take 1 tablet (25 mg total) by mouth at bedtime. 30 tablet 11  . tadalafil (CIALIS) 5 MG tablet Take 5 mg by mouth every 3 (three) days.    . tamsulosin (FLOMAX) 0.4 MG CAPS capsule Take 0.4 mg by mouth 2 (two) times daily.     Marland Kitchen torsemide (DEMADEX) 20 MG tablet Take 3 tablets (60 mg total) by mouth in the morning AND 2 tablets (40 mg total) daily after supper. 150 tablet 3  . triamcinolone  ointment (KENALOG) 0.1 % SMARTSIG:Sparingly Topical Twice Daily     No current facility-administered medications for this visit.    PHYSICAL EXAMINATION: ECOG PERFORMANCE STATUS: 1 - Symptomatic but completely ambulatory  Vitals:   07/19/20 1045  BP: (!) 145/71  Pulse: 64  Resp: 18  Temp: (!) 97.3 F (36.3 C)  SpO2: 97%   Filed Weights   07/19/20 1045  Weight: 221 lb (100.2 kg)    GENERAL:alert, no distress and comfortable dema ABDOMEN:abdomen soft, non-tender and normal bowel sounds Musculoskeletal:no cyanosis of digits and no clubbing  NEURO: alert & oriented x 3 with fluent speech, no focal motor/sensory deficits  LABORATORY DATA:  I have reviewed the data as listed    Component Value Date/Time   NA 139 07/12/2020 1038   NA 143 07/01/2018 1233   K 4.0 07/12/2020 1038   CL 105 07/12/2020 1038   CO2 28 07/12/2020 1038   GLUCOSE 202 (H) 07/12/2020 1038   BUN 21 07/12/2020 1038   BUN 27 07/01/2018 1233   CREATININE 2.22 (H) 07/12/2020 1038   CREATININE  2.06 (H) 10/10/2018 1404   CALCIUM 8.8 (L) 07/12/2020 1038   PROT 7.1 07/12/2020 1038   ALBUMIN 3.5 07/12/2020 1038   AST 17 07/12/2020 1038   AST 20 10/10/2018 1404   ALT 14 07/12/2020 1038   ALT 16 10/10/2018 1404   ALKPHOS 68 07/12/2020 1038   BILITOT 0.5 07/12/2020 1038   BILITOT 0.4 10/10/2018 1404   GFRNONAA 29 (L) 07/12/2020 1038   GFRNONAA 29 (L) 10/10/2018 1404   GFRAA 26 (L) 12/02/2019 1108   GFRAA 34 (L) 10/10/2018 1404    Lab Results  Component Value Date   SPEP Comment 10/10/2018    Lab Results  Component Value Date   WBC 4.1 07/12/2020   NEUTROABS 2.3 07/12/2020   HGB 10.8 (L) 07/12/2020   HCT 33.2 (L) 07/12/2020   MCV 101.5 (H) 07/12/2020   PLT 152 07/12/2020      Chemistry      Component Value Date/Time   NA 139 07/12/2020 1038   NA 143 07/01/2018 1233   K 4.0 07/12/2020 1038   CL 105 07/12/2020 1038   CO2 28 07/12/2020 1038   BUN 21 07/12/2020 1038   BUN 27 07/01/2018  1233   CREATININE 2.22 (H) 07/12/2020 1038   CREATININE 2.06 (H) 10/10/2018 1404      Component Value Date/Time   CALCIUM 8.8 (L) 07/12/2020 1038   ALKPHOS 68 07/12/2020 1038   AST 17 07/12/2020 1038   AST 20 10/10/2018 1404   ALT 14 07/12/2020 1038   ALT 16 10/10/2018 1404   BILITOT 0.5 07/12/2020 1038   BILITOT 0.4 10/10/2018 1404

## 2020-07-19 NOTE — Assessment & Plan Note (Signed)
The cause of his chronic anemia is due to chronic renal disease His recent vitamin B-12 and iron studies are adequate I do not recommend erythropoietin stimulating agent right now

## 2020-07-19 NOTE — Assessment & Plan Note (Signed)
He has mild intermittent acute on chronic renal failure; The cause of renal failure is not from multiple myeloma, rather, related to his heart disease and diabetes This is likely the contributing factor to the elevated light chain We discussed the importance of getting his risk factors under control I also recommend the patient to hydrate adequately before each visit

## 2020-08-04 ENCOUNTER — Other Ambulatory Visit (HOSPITAL_COMMUNITY): Payer: Self-pay | Admitting: Cardiology

## 2020-08-17 DIAGNOSIS — N184 Chronic kidney disease, stage 4 (severe): Secondary | ICD-10-CM | POA: Diagnosis not present

## 2020-08-17 DIAGNOSIS — E119 Type 2 diabetes mellitus without complications: Secondary | ICD-10-CM | POA: Diagnosis not present

## 2020-08-17 DIAGNOSIS — C9000 Multiple myeloma not having achieved remission: Secondary | ICD-10-CM | POA: Diagnosis not present

## 2020-08-17 DIAGNOSIS — Z1152 Encounter for screening for COVID-19: Secondary | ICD-10-CM | POA: Diagnosis not present

## 2020-08-24 DIAGNOSIS — H6123 Impacted cerumen, bilateral: Secondary | ICD-10-CM | POA: Diagnosis not present

## 2020-09-02 DIAGNOSIS — I5020 Unspecified systolic (congestive) heart failure: Secondary | ICD-10-CM | POA: Diagnosis not present

## 2020-09-02 DIAGNOSIS — C61 Malignant neoplasm of prostate: Secondary | ICD-10-CM | POA: Diagnosis not present

## 2020-09-02 DIAGNOSIS — I2510 Atherosclerotic heart disease of native coronary artery without angina pectoris: Secondary | ICD-10-CM | POA: Diagnosis not present

## 2020-09-02 DIAGNOSIS — I129 Hypertensive chronic kidney disease with stage 1 through stage 4 chronic kidney disease, or unspecified chronic kidney disease: Secondary | ICD-10-CM | POA: Diagnosis not present

## 2020-09-02 DIAGNOSIS — E1122 Type 2 diabetes mellitus with diabetic chronic kidney disease: Secondary | ICD-10-CM | POA: Diagnosis not present

## 2020-09-02 DIAGNOSIS — D472 Monoclonal gammopathy: Secondary | ICD-10-CM | POA: Diagnosis not present

## 2020-09-02 DIAGNOSIS — N2581 Secondary hyperparathyroidism of renal origin: Secondary | ICD-10-CM | POA: Diagnosis not present

## 2020-09-02 DIAGNOSIS — N183 Chronic kidney disease, stage 3 unspecified: Secondary | ICD-10-CM | POA: Diagnosis not present

## 2020-09-17 ENCOUNTER — Other Ambulatory Visit (HOSPITAL_COMMUNITY): Payer: Self-pay

## 2020-09-17 MED ORDER — HYDRALAZINE HCL 50 MG PO TABS: 50.0000 mg | ORAL_TABLET | Freq: Three times a day (TID) | ORAL | 0 refills | Status: DC

## 2020-09-21 NOTE — Progress Notes (Signed)
Name: Allen Dyer  Age/ Sex: 85 y.o., male   MRN/ DOB: [id number], Mar 05, 1936     PCP: [name], MD   Reason for Endocrinology Evaluation: Type 2 Diabetes Mellitus  Initial Endocrine Consultative Visit: 06/21/2020    PATIENT IDENTIFIER: [name] is a 85 y.o. male with a past medical history of T2DM, HTN, Neuropathy, CHF (04/2018). The patient has followed with Endocrinology clinic since 06/21/2020 for consultative assistance with management of his diabetes.  DIABETIC HISTORY:  Allen Dyer was diagnosed with DM > 30 yrs.started Rybelsus 05/2020  . His hemoglobin A1c has ranged from 6.6% in 2020, peaking at 8.1% .   SUBJECTIVE:   During the last visit (06/21/2020): A1c 8.8% We decreased Glipizide and increased Rybelsus   Today (09/21/2020): Allen Dyer is here for a follow up on diabetes management . He checks his blood sugars occasionally  times daily. The patient has not had hypoglycemic episodes since the last clinic visit  Denies nausea or vomiting     HOME DIABETES REGIMEN:  Glipizide 5 mg , 1 tablet before Breakfast  Rybelsus 7 mg , 1 tablet before Breakfast   Statin: no ACE-I/ARB: no    METER DOWNLOAD SUMMARY: Date range evaluated: 5/18-09/22/2020  Average Number Tests/Day = 0.3 Overall Mean FS Glucose = 108 Standard Deviation = 23  BG Ranges: Low = 75 High = 125   Hypoglycemic Events/30 Days: BG < 50 = 0 Episodes of symptomatic severe hypoglycemia = 0    DIABETIC COMPLICATIONS: Microvascular complications:   Neuropathy, CKD IV  Denies: retinopathy  Last Eye Exam: Completed   Macrovascular complications:   CHF  Denies: CAD, CVA, PVD   HISTORY:  Past Medical History:  Past Medical History:  Diagnosis Date  . Chronic combined systolic and diastolic CHF (congestive heart failure) ([place])   . CKD (chronic kidney disease), stage III ([place])   . Hyperlipidemia   . Hypertension   . Lower extremity edema   . Mild CAD    a. mild-mod by cath  05/2018.  . Multiple myeloma ([facility]) 11/15/2018  . NICM (nonischemic cardiomyopathy) ([place])   . Peripheral neuropathy   . Prostate cancer ([place])    Status post XRT  . Rheumatic fever   . Trifascicular block   . Type 2 diabetes mellitus ([place])     Past Surgical History:  Past Surgical History:  Procedure Laterality Date  . RIGHT HEART CATH N/A 07/10/2018   Procedure: RIGHT HEART CATH;  Surgeon: Larey Dresser, MD;  Location: [facility];  Service: Cardiovascular;  Laterality: N/A;  . RIGHT/LEFT HEART CATH AND CORONARY ANGIOGRAPHY N/A 06/18/2018   Procedure: RIGHT/LEFT HEART CATH AND CORONARY ANGIOGRAPHY;  Surgeon: Troy Sine, MD;  Location: [facility];  Service: Cardiovascular;  Laterality: N/A;     Social History:  reports that he has quit smoking. He has never used smokeless tobacco. He reports that he does not drink alcohol and does not use drugs. Family History:  Family History  Problem Relation Age of Onset  . Congestive Heart Failure Father        died from heart failure at the age of 54  . Cancer Mother   . Cancer Sister   . Congestive Heart Failure Brother        died from heart failure at the age of 83      HOME MEDICATIONS: Allergies as of 09/22/2020      Reactions   Tramadol Other (See Comments)  Dizziness (intolerance) hallucinate   Finasteride Swelling   Breast enlargement   Lisinopril Cough   Ciprofloxacin Other (See Comments)   Dizziness (intolerance)   Simvastatin Hives, Other (See Comments)   Blisters/ Rash   Sulfa Antibiotics Rash      Medication List       Accurate as of Sep 21, 2020  3:51 PM. If you have any questions, ask your nurse or doctor.        aspirin EC 81 MG tablet Take 81 mg by mouth every evening.   carvedilol 25 MG tablet Commonly known as: COREG Take 1 tablet (25 mg total) by mouth 2 (two) times daily.   cyanocobalamin 100 MCG tablet Take 100 mcg by mouth daily.   glipiZIDE 5 MG tablet Commonly known as:  GLUCOTROL Take 1 tablet (5 mg total) by mouth daily before breakfast.   hydrALAZINE 50 MG tablet Commonly known as: APRESOLINE Take 1 tablet (50 mg total) by mouth 3 (three) times daily. Please make an appointment for further refills   multivitamin with minerals Tabs tablet Take 1 tablet by mouth 2 (two) times daily.   NIFEdipine 30 MG 24 hr tablet Commonly known as: ADALAT CC Take by mouth.   OneTouch Verio test strip Generic drug: glucose blood 1 each by Other route daily. Use as instructed   Rybelsus 7 MG Tabs Generic drug: Semaglutide Take 7 mg by mouth daily before breakfast.   spironolactone 25 MG tablet Commonly known as: ALDACTONE Take 1 tablet (25 mg total) by mouth at bedtime.   tadalafil 5 MG tablet Commonly known as: CIALIS Take 5 mg by mouth every 3 (three) days.   tamsulosin 0.4 MG Caps capsule Commonly known as: FLOMAX Take 0.4 mg by mouth 2 (two) times daily.   torsemide 20 MG tablet Commonly known as: DEMADEX Take 3 tablets (60 mg total) by mouth in the morning AND 2 tablets (40 mg total) daily after supper.   triamcinolone ointment 0.1 % Commonly known as: KENALOG SMARTSIG:Sparingly Topical Twice Daily   VITAMIN D3 PO Take 1 tablet by mouth daily.        OBJECTIVE:   Vital Signs:BP (!) 144/84   Pulse 74   Ht _0  (1.702 m)   Wt 220 lb 4 oz (99.9 kg)   SpO2 94%   BMI 34.50 kg/m   Wt Readings from Last 3 Encounters:  07/19/20 221 lb (100.2 kg)  06/21/20 221 lb 6 oz (100.4 kg)  03/23/20 222 lb 3.2 oz (100.8 kg)     Exam: General: Allen Dyer appears well and is in NAD  Lungs: Clear with good BS bilat with no rales, rhonchi, or wheezes  Heart: RRR with normal S1 and S2 and no gallops; no murmurs; no rub  Abdomen: Normoactive bowel sounds, soft, nontender, without masses or organomegaly palpable  Extremities: No pretibial edema. .  Neuro: MS is good with appropriate affect, Allen Dyer is alert and Ox3    DM foot exam: Follows with podiatry,             DATA REVIEWED:  Lab Results  Component Value Date   HGBA1C 8.8 (A) 06/21/2020   HGBA1C 6.6 (H) 07/10/2018   Lab Results  Component Value Date   LDLCALC 62 07/10/2018   CREATININE 2.22 (H) 07/12/2020   No results found for: [facility]   Lab Results  Component Value Date   CHOL 108 07/10/2018   HDL 32 (L) 07/10/2018   LDLCALC 62 07/10/2018   TRIG 68  07/10/2018   CHOLHDL 3.4 07/10/2018         ASSESSMENT / PLAN / RECOMMENDATIONS:   1) Type 2 Diabetes Mellitus, Optimally controlled, With CKD IV complications - Most recent A1c of 7.5 %. Goal A1c < 7.5 %.    - Praised the Allen Dyer on opmtizing glucose control , in review of his meter download, he has been noted with tight BG's with the lowest 75 mg/dL.   - Will reduce Glipizide as below     MEDICATIONS: - Decrease Glipizide 5 mg , HALF a tablet before Breakfast  - Continue  Rybelsus 7 mg , 1 tablet before Breakfast   EDUCATION / INSTRUCTIONS:  BG monitoring instructions: Patient is instructed to check his blood sugars 1 times a day, fasting .  Call [facility] if: BG persistently < 70 . Marland Kitchen I reviewed the Rule of 15 for the treatment of hypoglycemia in detail with the patient. Literature supplied.     2) Diabetic complications:   Eye: Does not have known diabetic retinopathy.   Neuro/ Feet: Does not have known diabetic peripheral neuropathy .   Renal: Patient does  have known baseline CKD. He   is not on an ACEI/ARB at present. Will defer to nephrology      F/U in 6 months    Signed electronically by: Mack Guise, MD  [facility]  [address] [place], [place] [address] Phone: [phone] FAX: [phone]   CC: [name], [address] [place] [address] Phone: [phone]  Fax: [phone]  Return to Endocrinology clinic as below: Future Appointments  Date Time Provider  [place]  09/22/2020  1:20 PM Sam Overbeck, Melanie Crazier, MD [facility] None  09/28/2020  1:15 PM Gardiner Barefoot, DPM [facility]  07/12/2021 11:00 AM [facility] None  07/19/2021 11:00 AM Heath Lark, MD [facility] None

## 2020-09-22 ENCOUNTER — Ambulatory Visit (INDEPENDENT_AMBULATORY_CARE_PROVIDER_SITE_OTHER): Payer: Medicare Other | Admitting: Internal Medicine

## 2020-09-22 ENCOUNTER — Encounter: Payer: Self-pay | Admitting: Internal Medicine

## 2020-09-22 ENCOUNTER — Other Ambulatory Visit: Payer: Self-pay

## 2020-09-22 VITALS — BP 144/84 | HR 74 | Ht 67.0 in | Wt 220.2 lb

## 2020-09-22 DIAGNOSIS — C61 Malignant neoplasm of prostate: Secondary | ICD-10-CM | POA: Diagnosis not present

## 2020-09-22 DIAGNOSIS — E1122 Type 2 diabetes mellitus with diabetic chronic kidney disease: Secondary | ICD-10-CM

## 2020-09-22 DIAGNOSIS — N184 Chronic kidney disease, stage 4 (severe): Secondary | ICD-10-CM

## 2020-09-22 DIAGNOSIS — E1159 Type 2 diabetes mellitus with other circulatory complications: Secondary | ICD-10-CM | POA: Diagnosis not present

## 2020-09-22 DIAGNOSIS — E1165 Type 2 diabetes mellitus with hyperglycemia: Secondary | ICD-10-CM | POA: Diagnosis not present

## 2020-09-22 LAB — POCT GLYCOSYLATED HEMOGLOBIN (HGB A1C): Hemoglobin A1C: 7.5 % — AB (ref 4.0–5.6)

## 2020-09-22 MED ORDER — GLIPIZIDE 5 MG PO TABS: 2.5000 mg | ORAL_TABLET | Freq: Every day | ORAL | 1 refills | Status: DC

## 2020-09-22 NOTE — Patient Instructions (Addendum)
-   Decrease Glipizide 5 mg , HALF a tablet before Breakfast  - Continue  Rybelsus 7 mg , 1 tablet before Breakfast       HOW TO TREAT LOW BLOOD SUGARS (Blood sugar LESS THAN 70 MG/DL)  Please follow the RULE OF 15 for the treatment of hypoglycemia treatment (when your (blood sugars are less than 70 mg/dL)    STEP 1: Take 15 grams of carbohydrates when your blood sugar is low, which includes:   3-4 GLUCOSE TABS  OR  3-4 OZ OF JUICE OR REGULAR SODA OR  ONE TUBE OF GLUCOSE GEL     STEP 2: RECHECK blood sugar in 15 MINUTES STEP 3: If your blood sugar is still low at the 15 minute recheck --> then, go back to STEP 1 and treat AGAIN with another 15 grams of carbohydrates.

## 2020-09-28 ENCOUNTER — Encounter: Payer: Self-pay | Admitting: Podiatry

## 2020-09-28 ENCOUNTER — Other Ambulatory Visit: Payer: Self-pay

## 2020-09-28 ENCOUNTER — Ambulatory Visit (INDEPENDENT_AMBULATORY_CARE_PROVIDER_SITE_OTHER): Payer: Medicare Other | Admitting: Podiatry

## 2020-09-28 DIAGNOSIS — E1142 Type 2 diabetes mellitus with diabetic polyneuropathy: Secondary | ICD-10-CM

## 2020-09-28 DIAGNOSIS — M79675 Pain in left toe(s): Secondary | ICD-10-CM | POA: Diagnosis not present

## 2020-09-28 DIAGNOSIS — N183 Chronic kidney disease, stage 3 unspecified: Secondary | ICD-10-CM | POA: Diagnosis not present

## 2020-09-28 DIAGNOSIS — M79674 Pain in right toe(s): Secondary | ICD-10-CM | POA: Diagnosis not present

## 2020-09-28 DIAGNOSIS — R350 Frequency of micturition: Secondary | ICD-10-CM | POA: Diagnosis not present

## 2020-09-28 DIAGNOSIS — B351 Tinea unguium: Secondary | ICD-10-CM

## 2020-09-28 DIAGNOSIS — Z8546 Personal history of malignant neoplasm of prostate: Secondary | ICD-10-CM | POA: Diagnosis not present

## 2020-09-28 DIAGNOSIS — R3915 Urgency of urination: Secondary | ICD-10-CM | POA: Diagnosis not present

## 2020-09-28 NOTE — Progress Notes (Signed)
This patient returns to my office for at risk foot care.  This patient requires this care by a professional since this patient will be at risk due to having  Diabetes and chronic kidney disease.   This patient is unable to cut nails himself  since the patient cannot reach his  nails.These nails are painful walking and wearing shoes.  This patient presents for at risk foot care today.    General Appearance  Alert, conversant and in no acute stress.  Vascular  Dorsalis pedis and posterior tibial  pulses are palpable  bilaterally.  Capillary return is within normal limits  bilaterally. Temperature is within normal limits  bilaterally.  Neurologic  Senn-Weinstein monofilament wire test diminished   bilaterally. Muscle power within normal limits bilaterally.  Nails Thick disfigured discolored nails with subungual debris  from hallux to fifth toes bilaterally. No evidence of bacterial infection or drainage bilaterally.  Orthopedic  No limitations of motion  feet .  No crepitus or effusions noted.  No bony pathology or digital deformities noted.  Arthritis right foot/ankle.  Skin  normotropic skin with no porokeratosis noted bilaterally.  No signs of infections or ulcers noted.   Asymptomatic porokeratosis sub 5th  B/L.  Onychomycosis  Pain in right toes  Pain in left toes  Consent was obtained for treatment procedures.   Mechanical debridement of nails 1-5  bilaterally performed with a nail nipper.  Filed with dremel without incident. No infection or ulcer.     Return office visit   3 months       Told patient to return for periodic foot care and evaluation due to potential at risk complications.   Faydra Korman DPM  

## 2020-10-10 DIAGNOSIS — N390 Urinary tract infection, site not specified: Secondary | ICD-10-CM | POA: Diagnosis not present

## 2020-10-10 DIAGNOSIS — N184 Chronic kidney disease, stage 4 (severe): Secondary | ICD-10-CM | POA: Diagnosis not present

## 2020-10-10 DIAGNOSIS — I10 Essential (primary) hypertension: Secondary | ICD-10-CM | POA: Diagnosis not present

## 2020-10-10 DIAGNOSIS — M25522 Pain in left elbow: Secondary | ICD-10-CM | POA: Diagnosis not present

## 2020-10-11 DIAGNOSIS — E790 Hyperuricemia without signs of inflammatory arthritis and tophaceous disease: Secondary | ICD-10-CM | POA: Diagnosis not present

## 2020-10-11 DIAGNOSIS — M109 Gout, unspecified: Secondary | ICD-10-CM | POA: Diagnosis not present

## 2020-10-11 DIAGNOSIS — M25522 Pain in left elbow: Secondary | ICD-10-CM | POA: Diagnosis not present

## 2020-10-11 DIAGNOSIS — N390 Urinary tract infection, site not specified: Secondary | ICD-10-CM | POA: Diagnosis not present

## 2020-10-19 DIAGNOSIS — E118 Type 2 diabetes mellitus with unspecified complications: Secondary | ICD-10-CM | POA: Diagnosis not present

## 2020-10-19 DIAGNOSIS — H35372 Puckering of macula, left eye: Secondary | ICD-10-CM | POA: Diagnosis not present

## 2020-10-19 DIAGNOSIS — I10 Essential (primary) hypertension: Secondary | ICD-10-CM | POA: Diagnosis not present

## 2020-10-19 DIAGNOSIS — H4321 Crystalline deposits in vitreous body, right eye: Secondary | ICD-10-CM | POA: Diagnosis not present

## 2020-10-19 DIAGNOSIS — I668 Occlusion and stenosis of other cerebral arteries: Secondary | ICD-10-CM | POA: Diagnosis not present

## 2020-10-19 DIAGNOSIS — E113593 Type 2 diabetes mellitus with proliferative diabetic retinopathy without macular edema, bilateral: Secondary | ICD-10-CM | POA: Diagnosis not present

## 2020-10-19 DIAGNOSIS — H43813 Vitreous degeneration, bilateral: Secondary | ICD-10-CM | POA: Diagnosis not present

## 2020-10-21 DIAGNOSIS — N183 Chronic kidney disease, stage 3 unspecified: Secondary | ICD-10-CM | POA: Diagnosis not present

## 2020-10-21 DIAGNOSIS — N2581 Secondary hyperparathyroidism of renal origin: Secondary | ICD-10-CM | POA: Diagnosis not present

## 2020-10-21 DIAGNOSIS — N189 Chronic kidney disease, unspecified: Secondary | ICD-10-CM | POA: Diagnosis not present

## 2020-10-21 DIAGNOSIS — I129 Hypertensive chronic kidney disease with stage 1 through stage 4 chronic kidney disease, or unspecified chronic kidney disease: Secondary | ICD-10-CM | POA: Diagnosis not present

## 2020-10-21 DIAGNOSIS — I2510 Atherosclerotic heart disease of native coronary artery without angina pectoris: Secondary | ICD-10-CM | POA: Diagnosis not present

## 2020-10-21 DIAGNOSIS — E1122 Type 2 diabetes mellitus with diabetic chronic kidney disease: Secondary | ICD-10-CM | POA: Diagnosis not present

## 2020-10-21 DIAGNOSIS — D472 Monoclonal gammopathy: Secondary | ICD-10-CM | POA: Diagnosis not present

## 2020-10-21 DIAGNOSIS — I5020 Unspecified systolic (congestive) heart failure: Secondary | ICD-10-CM | POA: Diagnosis not present

## 2020-10-21 DIAGNOSIS — C61 Malignant neoplasm of prostate: Secondary | ICD-10-CM | POA: Diagnosis not present

## 2020-11-05 DIAGNOSIS — Z20822 Contact with and (suspected) exposure to covid-19: Secondary | ICD-10-CM | POA: Diagnosis not present

## 2020-12-20 DIAGNOSIS — Z20822 Contact with and (suspected) exposure to covid-19: Secondary | ICD-10-CM | POA: Diagnosis not present

## 2020-12-20 DIAGNOSIS — U071 COVID-19: Secondary | ICD-10-CM | POA: Diagnosis not present

## 2020-12-28 ENCOUNTER — Ambulatory Visit: Payer: Medicare Other | Admitting: Podiatry

## 2021-01-06 DIAGNOSIS — R051 Acute cough: Secondary | ICD-10-CM | POA: Diagnosis not present

## 2021-01-06 DIAGNOSIS — J209 Acute bronchitis, unspecified: Secondary | ICD-10-CM | POA: Diagnosis not present

## 2021-01-17 ENCOUNTER — Ambulatory Visit (INDEPENDENT_AMBULATORY_CARE_PROVIDER_SITE_OTHER): Payer: Medicare Other | Admitting: Podiatry

## 2021-01-17 ENCOUNTER — Other Ambulatory Visit: Payer: Self-pay

## 2021-01-17 ENCOUNTER — Encounter: Payer: Self-pay | Admitting: Podiatry

## 2021-01-17 DIAGNOSIS — E1159 Type 2 diabetes mellitus with other circulatory complications: Secondary | ICD-10-CM | POA: Diagnosis not present

## 2021-01-17 DIAGNOSIS — M79675 Pain in left toe(s): Secondary | ICD-10-CM

## 2021-01-17 DIAGNOSIS — N183 Chronic kidney disease, stage 3 unspecified: Secondary | ICD-10-CM

## 2021-01-17 DIAGNOSIS — M79674 Pain in right toe(s): Secondary | ICD-10-CM | POA: Diagnosis not present

## 2021-01-17 DIAGNOSIS — E1142 Type 2 diabetes mellitus with diabetic polyneuropathy: Secondary | ICD-10-CM | POA: Diagnosis not present

## 2021-01-17 DIAGNOSIS — B351 Tinea unguium: Secondary | ICD-10-CM

## 2021-01-17 NOTE — Progress Notes (Signed)
This patient returns to my office for at risk foot care.  This patient requires this care by a professional since this patient will be at risk due to having  Diabetes and chronic kidney disease.   This patient is unable to cut nails himself  since the patient cannot reach his  nails.These nails are painful walking and wearing shoes.  This patient presents for at risk foot care today.    General Appearance  Alert, conversant and in no acute stress.  Vascular  Dorsalis pedis and posterior tibial  pulses are palpable  bilaterally.  Capillary return is within normal limits  bilaterally. Temperature is within normal limits  bilaterally.  Neurologic  Senn-Weinstein monofilament wire test diminished   bilaterally. Muscle power within normal limits bilaterally.  Nails Thick disfigured discolored nails with subungual debris  from hallux to fifth toes bilaterally. No evidence of bacterial infection or drainage bilaterally.  Orthopedic  No limitations of motion  feet .  No crepitus or effusions noted.  No bony pathology or digital deformities noted.  Arthritis right foot/ankle.  Skin  normotropic skin with no porokeratosis noted bilaterally.  No signs of infections or ulcers noted.   Asymptomatic porokeratosis sub 5th  B/L.  Onychomycosis  Pain in right toes  Pain in left toes  Consent was obtained for treatment procedures.   Mechanical debridement of nails 1-5  bilaterally performed with a nail nipper.  Filed with dremel without incident. No infection or ulcer.     Return office visit   3 months       Told patient to return for periodic foot care and evaluation due to potential at risk complications.   Frutoso Dimare DPM  

## 2021-01-20 ENCOUNTER — Telehealth: Payer: Medicare Other | Admitting: Cardiology

## 2021-01-20 DIAGNOSIS — L280 Lichen simplex chronicus: Secondary | ICD-10-CM | POA: Diagnosis not present

## 2021-01-24 DIAGNOSIS — H6122 Impacted cerumen, left ear: Secondary | ICD-10-CM | POA: Diagnosis not present

## 2021-02-21 DIAGNOSIS — N183 Chronic kidney disease, stage 3 unspecified: Secondary | ICD-10-CM | POA: Diagnosis not present

## 2021-02-21 DIAGNOSIS — N2581 Secondary hyperparathyroidism of renal origin: Secondary | ICD-10-CM | POA: Diagnosis not present

## 2021-02-21 DIAGNOSIS — I129 Hypertensive chronic kidney disease with stage 1 through stage 4 chronic kidney disease, or unspecified chronic kidney disease: Secondary | ICD-10-CM | POA: Diagnosis not present

## 2021-02-21 DIAGNOSIS — C61 Malignant neoplasm of prostate: Secondary | ICD-10-CM | POA: Diagnosis not present

## 2021-02-21 DIAGNOSIS — I5020 Unspecified systolic (congestive) heart failure: Secondary | ICD-10-CM | POA: Diagnosis not present

## 2021-02-21 DIAGNOSIS — I2510 Atherosclerotic heart disease of native coronary artery without angina pectoris: Secondary | ICD-10-CM | POA: Diagnosis not present

## 2021-02-21 DIAGNOSIS — E1122 Type 2 diabetes mellitus with diabetic chronic kidney disease: Secondary | ICD-10-CM | POA: Diagnosis not present

## 2021-02-21 DIAGNOSIS — D472 Monoclonal gammopathy: Secondary | ICD-10-CM | POA: Diagnosis not present

## 2021-02-28 ENCOUNTER — Telehealth: Payer: Self-pay

## 2021-02-28 MED ORDER — ONETOUCH VERIO VI STRP: 1.0000 | ORAL_STRIP | Freq: Every day | 3 refills | Status: DC

## 2021-02-28 NOTE — Telephone Encounter (Signed)
Script sent  

## 2021-03-06 NOTE — Progress Notes (Signed)
Virtual Visit via Video  Note   This visit type was conducted due to national recommendations for restrictions regarding the COVID-19 Pandemic (e.g. social distancing) in an effort to limit this patient's exposure and mitigate transmission in our community.  Due to his co-morbid illnesses, this patient is at least at moderate risk for complications without adequate follow up.  This format is felt to be most appropriate for this patient at this time.  All issues noted in this document were discussed and addressed.  A limited physical exam was performed with this format.  Please refer to the patient's chart for his consent to telehealth for [facility]   Evaluation Performed:  Follow-up visit  Date:  03/07/2021   ID:  Allen Dyer, DOB Mar 13, 1936, MRN [id number]  Patient Location:  Home  Provider location:   [place]  PCP:  [name], MD  Cardiologist:  [name], MD/[name], MD Electrophysiologist:  None   Chief Complaint:  OSA and HTN  History of Present Illness:    Allen Dyer is a 85 y.o. male who presents via audio/video conferencing for a telehealth visit today.    This is a 85yo male with a history of type 2 diabetes mellitus, rheumatic fever, hypertension, peripheral neuropathy with edema, hyperlipidemia, chronic CKD stage III, prostate CA status post XRT.   2D echo 05/29/2018 showed moderate to severe LV dysfunction with EF 30 to 35% with moderately dilated LV and grade 2 diastolic dysfunction, moderate pulmonary hypertension with PASP 59 mmHg.   He underwent R/L heart cath showing mildly elevated right and left heart filling pressures with moderate pulmonary venous HTN with normal PVR 1.7 and preserved CO. He had nonobstructive CAD with 50% pLADHe was changed form Lasix to Torsemide 62m BID.   Repeat 2D echo on guideline directed HF therapy revealed persistent LV dysfunction with EF 25-30% and grade 2 DD and mild to moderate AR.  He is followed in [facility]  clinic.     He was dx with severe OSA with an AHI of 59/hr and nocturnal hypoxemia with O2 desats as low as 63% and was titrated and now on BiPAP at 19/15cm H2O.  He is doing well with his PAP device and thinks that he has gotten used to it.  He tolerates the full face mask but it had been leaking and he just got a new one.  He feels the pressure at times is too high. Since going on PAP he feels rested in the am and has no significant daytime sleepiness.  He denies any significant mouth or nasal dryness or nasal congestion.  He does not think that he snores.     The patient does not have symptoms concerning for COVID-19 infection (fever, chills, cough, or new shortness of breath).    Prior CV studies:   The following studies were reviewed today:  PAP compliance download  Past Medical History:  Diagnosis Date   Chronic combined systolic and diastolic CHF (congestive heart failure) ([facility])    CKD (chronic kidney disease), stage III ([facility])    Hyperlipidemia    Hypertension    Lower extremity edema    Mild CAD    a. mild-mod by cath 05/2018.   Multiple myeloma ([facility] 11/15/2018   N[place] ([place]mic [place]) [place]    Peripheral neuropathy    Prostate cancer ([facility])    Status post XRT   Rheumatic fever    Trifascicular block    Type 2 diabetes mellitus ([facility])    Past  Surgical History:  Procedure Laterality Date   RIGHT HEART CATH N/A 07/10/2018   Procedure: RIGHT HEART CATH;  Surgeon: Larey Dresser, MD;  Location: [facility];  Service: Cardiovascular;  Laterality: N/A;   RIGHT/LEFT HEART CATH AND CORONARY ANGIOGRAPHY N/A 06/18/2018   Procedure: RIGHT/LEFT HEART CATH AND CORONARY ANGIOGRAPHY;  Surgeon: Troy Sine, MD;  Location: [facility];  Service: Cardiovascular;  Laterality: N/A;     Current Meds  Medication Sig   allopurinol (ZYLOPRIM) 100 MG tablet Take 0.5 tablets by mouth daily.   Ascorbic Acid (VITAMIN C) 1000 MG tablet 1 tablet   aspirin EC 81 MG tablet  Take 81 mg by mouth every evening.    carvedilol (COREG) 25 MG tablet Take 1 tablet (25 mg total) by mouth 2 (two) times daily.   Cholecalciferol (VITAMIN D3 PO) Take 1 tablet by mouth daily.    cyanocobalamin 100 MCG tablet Take 100 mcg by mouth daily.    gabapentin (NEURONTIN) 600 MG tablet Take 0.5 tablets by mouth 2 (two) times daily.   Garlic 1017 MG CAPS Take by mouth.   glipiZIDE (GLUCOTROL) 5 MG tablet Take 0.5 tablets (2.5 mg total) by mouth daily before breakfast.   glucose blood (ONETOUCH VERIO) test strip 1 each by Other route daily. Use as instructed   hydrALAZINE (APRESOLINE) 50 MG tablet Take 1 tablet (50 mg total) by mouth 3 (three) times daily. Please make an appointment for further refills   isosorbide mononitrate (IMDUR) 30 MG 24 hr tablet Take 1 tablet by mouth daily.   Multiple Vitamin (MULTIVITAMIN WITH MINERALS) TABS tablet Take 1 tablet by mouth 2 (two) times daily.   nitrofurantoin, macrocrystal-monohydrate, (MACROBID) 100 MG capsule Take 1 capsule by mouth 2 (two) times daily.   Semaglutide (RYBELSUS) 7 MG TABS Take 7 mg by mouth daily before breakfast.   spironolactone (ALDACTONE) 25 MG tablet Take 1 tablet (25 mg total) by mouth at bedtime.   tamsulosin (FLOMAX) 0.4 MG CAPS capsule Take 0.4 mg by mouth 2 (two) times daily.    torsemide (DEMADEX) 20 MG tablet Take 3 tablets (60 mg total) by mouth in the morning AND 2 tablets (40 mg total) daily after supper.   [DISCONTINUED] hydrALAZINE (APRESOLINE) 25 MG tablet 2 tablets     Allergies:   Tramadol, Finasteride, Lisinopril, Ciprofloxacin, Simvastatin, and Sulfa antibiotics   Social History   Tobacco Use   Smoking status: Former   Smokeless tobacco: Never  Vaping Use   Vaping Use: Never used  Substance Use Topics   Alcohol use: Never   Drug use: Never     Family Hx: The patient's family history includes Cancer in his mother and sister; Congestive Heart Failure in his brother and father.  ROS:   Please see  the history of present illness.     All other systems reviewed and are negative.   Labs/Other Tests and Data Reviewed:    Recent Labs: 07/12/2020: ALT 14; BUN 21; Creatinine, Ser 2.22; Hemoglobin 10.8; Platelets 152; Potassium 4.0; Sodium 139   Recent Lipid Panel Lab Results  Component Value Date/Time   CHOL 108 07/10/2018 03:37 AM   TRIG 68 07/10/2018 03:37 AM   HDL 32 (L) 07/10/2018 03:37 AM   CHOLHDL 3.4 07/10/2018 03:37 AM   LDLCALC 62 07/10/2018 03:37 AM    Wt Readings from Last 3 Encounters:  03/07/21 220 lb (99.8 kg)  09/22/20 220 lb 4 oz (99.9 kg)  07/19/20 221 lb (100.2 kg)  Objective:    Vital Signs:  BP 122/66   Pulse 66   Temp 97.7 F (36.5 C)   Ht 5' 7"  (1.702 m)   Wt 220 lb (99.8 kg)   SpO2 97%   BMI 34.46 kg/m   Well nourished, well developed male in no acute distress. Well appearing, alert and conversant, regular work of breathing,  good skin color  Eyes- anicteric mouth- oral mucosa is pink  neuro- grossly intact skin- no apparent rash or lesions or cyanosis   ASSESSMENT & PLAN:    1.  OSA - The patient is tolerating PAP therapy well without any problems. The PAP download performed by his DME was personally reviewed and interpreted by me today and showed an AHI of 13.2/hr on 19/15 cm H2O with 93% compliance in using more than 4 hours nightly.  The patient has been using and benefiting from PAP use and will continue to benefit from therapy.  -his AHI is too high>>his old mask was leaking too much and he just got a new mask -I am going to change him to auto BIPAP with IPAP max 20cm H2O, EPAP min 5cm H2O and PS 4cm H2O.   -check download in 4 weeks   2.  HTN -BP controlled on exam today -continue prescription drug management with spiro 38m daily, Hydralazine 567mTID and Carvedilol 18.7535mID with PRN refills  3.  Obesity - I have encouraged he to get into a routine exercise program and cut back on carbs and portions.    COVID-19  Education: The signs and symptoms of COVID-19 were discussed with the patient and how to seek care for testing (follow up with PCP or arrange E-visit).  The importance of social distancing was discussed today.  Patient Risk:   After full review of this patient's clinical status, I feel that they are at least moderate risk at this time.  Time:   Today, I have spent 20 minutes on telemedicine discussing medical problems including OSA, HTN and  reviewing patient's chart including PAP compliance download.  Medication Adjustments/Labs and Tests Ordered: Current medicines are reviewed at length with the patient today.  Concerns regarding medicines are outlined above.  Tests Ordered: No orders of the defined types were placed in this encounter.  Medication Changes: No orders of the defined types were placed in this encounter.   Disposition:  Follow up in 1 year(s)  Signed, TraFransico HimD  03/07/2021 10:42 AM    [facility]

## 2021-03-07 ENCOUNTER — Encounter: Payer: Self-pay | Admitting: Cardiology

## 2021-03-07 ENCOUNTER — Telehealth (INDEPENDENT_AMBULATORY_CARE_PROVIDER_SITE_OTHER): Payer: Medicare Other | Admitting: Cardiology

## 2021-03-07 ENCOUNTER — Other Ambulatory Visit: Payer: Self-pay

## 2021-03-07 VITALS — BP 122/66 | HR 66 | Temp 97.7°F | Ht 67.0 in | Wt 220.0 lb

## 2021-03-07 DIAGNOSIS — I10 Essential (primary) hypertension: Secondary | ICD-10-CM | POA: Diagnosis not present

## 2021-03-07 DIAGNOSIS — G4733 Obstructive sleep apnea (adult) (pediatric): Secondary | ICD-10-CM

## 2021-03-07 NOTE — Patient Instructions (Signed)

## 2021-03-15 ENCOUNTER — Telehealth: Payer: Self-pay | Admitting: *Deleted

## 2021-03-15 DIAGNOSIS — G4733 Obstructive sleep apnea (adult) (pediatric): Secondary | ICD-10-CM

## 2021-03-15 NOTE — Telephone Encounter (Signed)
Order placed to [facility] via fax. 

## 2021-03-15 NOTE — Telephone Encounter (Signed)
-----   Message from [place], [place] sent at 03/07/2021 11:17 AM EST ----- Please see message below from Dr. Radford Pax: change him to auto BIPAP with IPAP max 20cm H2O, EPAP min 5cm H2O and PS 4cm H2O. -check download in 4 weeks  Thanks!

## 2021-03-21 ENCOUNTER — Other Ambulatory Visit: Payer: Self-pay | Admitting: Internal Medicine

## 2021-03-29 DIAGNOSIS — Z20822 Contact with and (suspected) exposure to covid-19: Secondary | ICD-10-CM | POA: Diagnosis not present

## 2021-03-30 ENCOUNTER — Ambulatory Visit (INDEPENDENT_AMBULATORY_CARE_PROVIDER_SITE_OTHER): Payer: Medicare Other | Admitting: Internal Medicine

## 2021-03-30 ENCOUNTER — Other Ambulatory Visit: Payer: Self-pay

## 2021-03-30 ENCOUNTER — Encounter: Payer: Self-pay | Admitting: Internal Medicine

## 2021-03-30 VITALS — BP 128/80 | HR 78 | Ht 67.0 in | Wt 221.0 lb

## 2021-03-30 DIAGNOSIS — R0989 Other specified symptoms and signs involving the circulatory and respiratory systems: Secondary | ICD-10-CM | POA: Diagnosis not present

## 2021-03-30 DIAGNOSIS — I739 Peripheral vascular disease, unspecified: Secondary | ICD-10-CM | POA: Insufficient documentation

## 2021-03-30 DIAGNOSIS — E1165 Type 2 diabetes mellitus with hyperglycemia: Secondary | ICD-10-CM

## 2021-03-30 LAB — POCT GLYCOSYLATED HEMOGLOBIN (HGB A1C): Hemoglobin A1C: 9 % — AB (ref 4.0–5.6)

## 2021-03-30 LAB — GLUCOSE, POCT (MANUAL RESULT ENTRY): POC Glucose: 240 mg/dl — AB (ref 70–99)

## 2021-03-30 MED ORDER — GLIPIZIDE 5 MG PO TABS: ORAL_TABLET | ORAL | 3 refills | Status: DC

## 2021-03-30 MED ORDER — RYBELSUS 14 MG PO TABS: 14.0000 mg | ORAL_TABLET | Freq: Every day | ORAL | 3 refills | Status: DC

## 2021-03-30 NOTE — Patient Instructions (Addendum)
-   Increase Glipizide 5 mg ,1 tablet before Breakfast and Half a tablet before Supper  - Increase   Rybelsus to 14 mg , 1 tablet before Breakfast    HOW TO TREAT LOW BLOOD SUGARS (Blood sugar LESS THAN 70 MG/DL) Please follow the RULE OF 15 for the treatment of hypoglycemia treatment (when your (blood sugars are less than 70 mg/dL)   STEP 1: Take 15 grams of carbohydrates when your blood sugar is low, which includes:  3-4 GLUCOSE TABS  OR 3-4 OZ OF JUICE OR REGULAR SODA OR ONE TUBE OF GLUCOSE GEL    STEP 2: RECHECK blood sugar in 15 MINUTES STEP 3: If your blood sugar is still low at the 15 minute recheck --> then, go back to STEP 1 and treat AGAIN with another 15 grams of carbohydrates.

## 2021-03-30 NOTE — Progress Notes (Signed)
Name: Allen Dyer  Age/ Sex: 85 y.o., male   MRN/ DOB: [id number], [birth year]     PCP: [name], MD   Reason for Endocrinology Evaluation: Type 2 Diabetes Mellitus  Initial Endocrine Consultative Visit: 06/21/2020    PATIENT IDENTIFIER: Allen Dyer is a 85 y.o. male with a past medical history of T2DM, HTN, Neuropathy, CHF (04/2018). The patient has followed with Endocrinology clinic since 06/21/2020 for consultative assistance with management of his diabetes.  DIABETIC HISTORY:  Allen Dyer was diagnosed with DM > 30 yrs.started Rybelsus 05/2020  . His hemoglobin A1c has ranged from 6.6% in 2020, peaking at 8.1% .   SUBJECTIVE:   During the last visit (06/21/2020): A1c 8.8% We decreased Glipizide and increased Rybelsus   Today (03/30/2021): Allen Dyer is here for a follow up on diabetes management . He checks his blood sugars occasionally  times daily. The patient has not had hypoglycemic episodes since the last clinic visit  Denies nausea or vomiting or diarrhea  Weight stable     HOME DIABETES REGIMEN:  Glipizide 5 mg , 1 tablet before Breakfast  Rybelsus 7 mg , 1 tablet before Breakfast      Statin: no ACE-I/ARB: no    METER DOWNLOAD SUMMARY:  75-240 mg/dL     DIABETIC COMPLICATIONS: Microvascular complications:  Neuropathy, CKD IV Denies: retinopathy Last Eye Exam: Completed 10/2020  Macrovascular complications:  CHF Denies: CAD, CVA, PVD   HISTORY:  Past Medical History:  Past Medical History:  Diagnosis Date   Chronic combined systolic and diastolic CHF (congestive heart failure) (HCC)    CKD (chronic kidney disease), stage III (HCC)    Hyperlipidemia    Hypertension    Lower extremity edema    Mild CAD    a. mild-mod by cath 05/2018.   Multiple myeloma ([place]) 11/15/2018   NICM (nonischemic cardiomyopathy) ([place])    Peripheral neuropathy    Prostate cancer ([place])    Status post XRT   Rheumatic fever    Trifascicular block    Type 2  diabetes mellitus ([place])    Past Surgical History:  Past Surgical History:  Procedure Laterality Date   RIGHT HEART CATH N/A 07/10/2018   Procedure: RIGHT HEART CATH;  Surgeon: Larey Dresser, MD;  Location: [facility];  Service: Cardiovascular;  Laterality: N/A;   RIGHT/LEFT HEART CATH AND CORONARY ANGIOGRAPHY N/A 06/18/2018   Procedure: RIGHT/LEFT HEART CATH AND CORONARY ANGIOGRAPHY;  Surgeon: Troy Sine, MD;  Location: [facility];  Service: Cardiovascular;  Laterality: N/A;   Social History:  reports that he has quit smoking. He has never used smokeless tobacco. He reports that he does not drink alcohol and does not use drugs. Family History:  Family History  Problem Relation Age of Onset   Congestive Heart Failure Father        died from heart failure at the age of 62   Cancer Mother    Cancer Sister    Congestive Heart Failure Brother        died from heart failure at the age of 18     HOME MEDICATIONS: Allergies as of 03/30/2021       Reactions   Tramadol Other (See Comments)   Dizziness (intolerance) hallucinate   Finasteride Swelling   Breast enlargement   Lisinopril Cough   Ciprofloxacin Other (See Comments)   Dizziness (intolerance)   Simvastatin Hives, Other (See Comments)   Blisters/ Rash   Sulfa Antibiotics Rash  Medication List        Accurate as of March 30, 2021  1:29 PM. If you have any questions, ask your nurse or doctor.          STOP taking these medications    nitrofurantoin (macrocrystal-monohydrate) 100 MG capsule Commonly known as: MACROBID Stopped by: Dorita Sciara, MD       TAKE these medications    allopurinol 100 MG tablet Commonly known as: ZYLOPRIM Take 0.5 tablets by mouth daily.   aspirin EC 81 MG tablet Take 81 mg by mouth every evening.   carvedilol 25 MG tablet Commonly known as: COREG Take 1 tablet (25 mg total) by mouth 2 (two) times daily.   cyanocobalamin 100 MCG  tablet Take 100 mcg by mouth daily.   gabapentin 600 MG tablet Commonly known as: NEURONTIN Take 0.5 tablets by mouth 2 (two) times daily.   Garlic 0160 MG Caps Take by mouth.   glipiZIDE 5 MG tablet Commonly known as: GLUCOTROL Take 1 tablet (5 mg total) by mouth daily before breakfast AND 0.5 tablets (2.5 mg total) daily before supper. What changed: See the new instructions. Changed by: Dorita Sciara, MD   hydrALAZINE 50 MG tablet Commonly known as: APRESOLINE Take 1 tablet (50 mg total) by mouth 3 (three) times daily. Please make an appointment for further refills   isosorbide mononitrate 30 MG 24 hr tablet Commonly known as: IMDUR Take 1 tablet by mouth daily.   multivitamin with minerals Tabs tablet Take 1 tablet by mouth 2 (two) times daily.   NIFEdipine 30 MG 24 hr tablet Commonly known as: ADALAT CC Take by mouth.   OneTouch Verio test strip Generic drug: glucose blood 1 each by Other route daily. Use as instructed   Rybelsus 14 MG Tabs Generic drug: Semaglutide Take 14 mg by mouth daily. What changed:  medication strength how much to take when to take this Changed by: Dorita Sciara, MD   spironolactone 25 MG tablet Commonly known as: ALDACTONE Take 1 tablet (25 mg total) by mouth at bedtime.   tadalafil 5 MG tablet Commonly known as: CIALIS Take 5 mg by mouth every 3 (three) days.   tamsulosin 0.4 MG Caps capsule Commonly known as: FLOMAX Take 0.4 mg by mouth 2 (two) times daily.   torsemide 20 MG tablet Commonly known as: DEMADEX Take 3 tablets (60 mg total) by mouth in the morning AND 2 tablets (40 mg total) daily after supper.   triamcinolone ointment 0.1 % Commonly known as: KENALOG   vitamin C 1000 MG tablet 1 tablet   VITAMIN D3 PO Take 1 tablet by mouth daily.         OBJECTIVE:   Vital Signs:BP 128/80 (BP Location: Left Arm, Patient Position: Sitting, Cuff Size: Large)   Pulse 78   Ht 5' 7"  (1.702 m)   Wt  221 lb (100.2 kg)   SpO2 99%   BMI 34.61 kg/m   Wt Readings from Last 3 Encounters:  03/30/21 221 lb (100.2 kg)  03/07/21 220 lb (99.8 kg)  09/22/20 220 lb 4 oz (99.9 kg)     Exam: General: Pt appears well and is in NAD  Lungs: Clear with good BS bilat with no rales, rhonchi, or wheezes  Heart: RRR with normal S1 and S2 and no gallops; no murmurs; no rub  Abdomen: Normoactive bowel sounds, soft, nontender, without masses or organomegaly palpable  Extremities: Trace  pretibial edema. .  Neuro: MS is  good with appropriate affect, pt is alert and Ox3    DM foot exam: 03/30/2021  The skin of the feet is without sores or ulcerations.Toe nails discolored and dystrophic The pedal pulses are undetectable on today's exam  The sensation is decreased  to a screening 5.07, 10 gram monofilament bilaterally    DATA REVIEWED:  Lab Results  Component Value Date   HGBA1C 9.0 (A) 03/30/2021   HGBA1C 7.5 (A) 09/22/2020   HGBA1C 8.8 (A) 06/21/2020   Lab Results  Component Value Date   LDLCALC 62 07/10/2018   CREATININE 2.22 (H) 07/12/2020   No results found for: [facility]   Lab Results  Component Value Date   CHOL 108 07/10/2018   HDL 32 (L) 07/10/2018   LDLCALC 62 07/10/2018   TRIG 68 07/10/2018   CHOLHDL 3.4 07/10/2018         ASSESSMENT / PLAN / RECOMMENDATIONS:   1) Type 2 Diabetes Mellitus, Poorly controlled, With CKD IV and neuropathic complications - Most recent A1c of 9.0  %. Goal A1c < 7.5 %.     - Worsening glycemic control due to dietary indiscretions  - We discussed limiting CHO intake and avoiding sugar-sweetened foods and beverages  - He has continued to take 1 tablet of Glipizide before Breakfast rather then half, will add another Glipizide before Supper     MEDICATIONS: - Increase  Glipizide 5 mg , 1 tablet before Breakfast  and Half a tablet before Supper  - Increase  Rybelsus 14 mg , 1 tablet before Breakfast   EDUCATION / INSTRUCTIONS: BG  monitoring instructions: Patient is instructed to check his blood sugars 1 times a day, fasting . Call [facility] if: BG persistently < 70 . I reviewed the Rule of 15 for the treatment of hypoglycemia in detail with the patient. Literature supplied.     2) Diabetic complications:  Eye: Does not have known diabetic retinopathy.  Neuro/ Feet: Does have known diabetic peripheral neuropathy .  Renal: Patient does  have known baseline CKD. He   is not on an ACEI/ARB at present. Will defer to nephrology    3) Decreased Dorsalis Pedis Pulses    - Decrease DP on exam today could be due to edema  - Per  pt gets LE ultrasound every 6 months in the past and is requesting a referral to vascular . - I have ordered an ABI as I don't  see one in our system   F/U in 4 months    Signed electronically by: Mack Guise, MD  [facility]  [address] [address] [place], [place] [address] Phone: [phone] FAX: [phone]   CC: [name], [place] [address] [address] Phone: [phone]  Fax: [phone]  Return to Endocrinology clinic as below: Future Appointments  Date Time Provider [place]  04/22/2021 11:15 AM Gardiner Barefoot, DPM [facility]  07/12/2021 11:00 AM [facility] None  07/19/2021 11:00 AM Heath Lark, MD [facility] None

## 2021-04-12 ENCOUNTER — Other Ambulatory Visit: Payer: Self-pay

## 2021-04-12 DIAGNOSIS — I739 Peripheral vascular disease, unspecified: Secondary | ICD-10-CM

## 2021-04-15 ENCOUNTER — Other Ambulatory Visit (HOSPITAL_COMMUNITY): Payer: Self-pay | Admitting: Cardiology

## 2021-04-15 NOTE — Addendum Note (Signed)
Addended by: Jerl Mina on: 04/15/2021 03:58 PM   Modules accepted: Orders

## 2021-04-22 ENCOUNTER — Other Ambulatory Visit: Payer: Self-pay

## 2021-04-22 ENCOUNTER — Encounter: Payer: Self-pay | Admitting: Podiatry

## 2021-04-22 ENCOUNTER — Ambulatory Visit (INDEPENDENT_AMBULATORY_CARE_PROVIDER_SITE_OTHER): Payer: Medicare Other | Admitting: Podiatry

## 2021-04-22 DIAGNOSIS — M79675 Pain in left toe(s): Secondary | ICD-10-CM

## 2021-04-22 DIAGNOSIS — N183 Chronic kidney disease, stage 3 unspecified: Secondary | ICD-10-CM

## 2021-04-22 DIAGNOSIS — M79674 Pain in right toe(s): Secondary | ICD-10-CM

## 2021-04-22 DIAGNOSIS — B351 Tinea unguium: Secondary | ICD-10-CM

## 2021-04-22 DIAGNOSIS — E1159 Type 2 diabetes mellitus with other circulatory complications: Secondary | ICD-10-CM | POA: Diagnosis not present

## 2021-04-22 DIAGNOSIS — E1142 Type 2 diabetes mellitus with diabetic polyneuropathy: Secondary | ICD-10-CM | POA: Diagnosis not present

## 2021-04-22 NOTE — Progress Notes (Signed)
This patient returns to my office for at risk foot care.  This patient requires this care by a professional since this patient will be at risk due to having  Diabetes and chronic kidney disease.   This patient is unable to cut nails himself  since the patient cannot reach his  nails.These nails are painful walking and wearing shoes.  This patient presents for at risk foot care today.    General Appearance  Alert, conversant and in no acute stress.  Vascular  Dorsalis pedis and posterior tibial  pulses are palpable  bilaterally.  Capillary return is within normal limits  bilaterally. Temperature is within normal limits  bilaterally.  Neurologic  Senn-Weinstein monofilament wire test diminished   bilaterally. Muscle power within normal limits bilaterally.  Nails Thick disfigured discolored nails with subungual debris  from hallux to fifth toes bilaterally. No evidence of bacterial infection or drainage bilaterally.  Orthopedic  No limitations of motion  feet .  No crepitus or effusions noted.  No bony pathology or digital deformities noted.  Arthritis right foot/ankle.  Skin  normotropic skin with no porokeratosis noted bilaterally.  No signs of infections or ulcers noted.   Asymptomatic porokeratosis sub 5th  B/L.  Onychomycosis  Pain in right toes  Pain in left toes  Consent was obtained for treatment procedures.   Mechanical debridement of nails 1-5  bilaterally performed with a nail nipper.  Filed with dremel without incident. No infection or ulcer.     Return office visit   3 months       Told patient to return for periodic foot care and evaluation due to potential at risk complications.   Jameyah Fennewald DPM  

## 2021-04-27 DIAGNOSIS — H4321 Crystalline deposits in vitreous body, right eye: Secondary | ICD-10-CM | POA: Diagnosis not present

## 2021-04-27 DIAGNOSIS — H35372 Puckering of macula, left eye: Secondary | ICD-10-CM | POA: Diagnosis not present

## 2021-04-27 DIAGNOSIS — H43813 Vitreous degeneration, bilateral: Secondary | ICD-10-CM | POA: Diagnosis not present

## 2021-04-28 NOTE — Progress Notes (Signed)
Office Note     CC:  nonpalpable pedal pulses Requesting Provider:  [name], MD  HPI: Allen Dyer is a 86 y.o. ([birth year]) male presenting at the request of .Koirala, Dibas, MD for nonpalpable pedal pulses.  On exam, Allen Dyer was doing well.  He denied symptoms of claudication, rest pain, tissue loss but has appreciated new onset neuropathy, for which she is taking gabapentin.  Of note he is a longtime diabetic.    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  He continues to live an active lifestyle.  His wife recently passed, and he has been living with his daughter since.  He ambulates independently without the need of a walker or cane.  The pt is not on a statin for cholesterol management.  The pt is  on a daily aspirin.   Other AC:  - The pt is  on medication for hypertension.   The pt is  diabetic.  Tobacco hx:  -  Past Medical History:  Diagnosis Date   Chronic combined systolic and diastolic CHF (congestive heart failure) (HCC)    CKD (chronic kidney disease), stage III (HCC)    Hyperlipidemia    Hypertension    Lower extremity edema    Mild CAD    a. mild-mod by cath 05/2018.   Multiple myeloma ([place]) 11/15/2018   NICM (nonischemic cardiomyopathy) ([place])    Peripheral neuropathy    Prostate cancer ([place])    Status post XRT   Rheumatic fever    Trifascicular block    Type 2 diabetes mellitus [facility])     Past Surgical History:  Procedure Laterality Date   RIGHT HEART CATH N/A 07/10/2018   Procedure: RIGHT HEART CATH;  Surgeon: Larey Dresser, MD;  Location: [facility];  Service: Cardiovascular;  Laterality: N/A;   RIGHT/LEFT HEART CATH AND CORONARY ANGIOGRAPHY N/A 06/18/2018   Procedure: RIGHT/LEFT HEART CATH AND CORONARY ANGIOGRAPHY;  Surgeon: Troy Sine, MD;  Location: [facility];  Service: Cardiovascular;  Laterality: N/A;    Social History   Socioeconomic History   Marital status: Widowed     Spouse name: Not on file   Number of children: 4   Years of education: Not on file   Highest education level: Not on file  Occupational History   Occupation: retired from post [employer], educational  Tobacco Use   Smoking status: Former   Smokeless tobacco: Never  Scientific laboratory technician Use: Never used  Substance and Sexual Activity   Alcohol use: Never   Drug use: Never   Sexual activity: Not on file  Other Topics Concern   Not on file  Social History Narrative   Not on file   Social Determinants of Health   Financial Resource Strain: Not on file  Food Insecurity: Not on file  Transportation Needs: Not on file  Physical Activity: Not on file  Stress: Not on file  Social Connections: Not on file  Intimate Partner Violence: Not on file    Family History  Problem Relation Age of Onset   Congestive Heart Failure Father        died from heart failure at the age of 21   Cancer Mother    Cancer Sister    Congestive Heart Failure Brother        died from heart failure at the age of 63    Current Outpatient Medications  Medication Sig  Dispense Refill   allopurinol (ZYLOPRIM) 100 MG tablet Take 0.5 tablets by mouth daily.     Ascorbic Acid (VITAMIN C) 1000 MG tablet 1 tablet     aspirin EC 81 MG tablet Take 81 mg by mouth every evening.      carvedilol (COREG) 12.5 MG tablet Take 1.5 tablets (18.75 mg total) by mouth 2 (two) times daily with a meal. NEEDS APPOINTMENT FOR MORE REFILLS 90 tablet 0   Cholecalciferol (VITAMIN D3 PO) Take 1 tablet by mouth daily.      cyanocobalamin 100 MCG tablet Take 100 mcg by mouth daily.      gabapentin (NEURONTIN) 600 MG tablet Take 0.5 tablets by mouth 2 (two) times daily.     Garlic 2423 MG CAPS Take by mouth.     glipiZIDE (GLUCOTROL) 5 MG tablet Take 1 tablet (5 mg total) by mouth daily before breakfast AND 0.5 tablets (2.5 mg total) daily before supper. 135 tablet 3   glucose blood (ONETOUCH VERIO) test strip 1 each by Other route  daily. Use as instructed 100 each 3   hydrALAZINE (APRESOLINE) 50 MG tablet Take 1 tablet (50 mg total) by mouth 3 (three) times daily. Please make an appointment for further refills 90 tablet 0   isosorbide mononitrate (IMDUR) 30 MG 24 hr tablet Take 1 tablet by mouth daily.     Multiple Vitamin (MULTIVITAMIN WITH MINERALS) TABS tablet Take 1 tablet by mouth 2 (two) times daily.     NIFEdipine (ADALAT CC) 30 MG 24 hr tablet Take by mouth.     Semaglutide (RYBELSUS) 14 MG TABS Take 14 mg by mouth daily. 90 tablet 3   spironolactone (ALDACTONE) 25 MG tablet Take 1 tablet (25 mg total) by mouth at bedtime. 30 tablet 11   tadalafil (CIALIS) 5 MG tablet Take 5 mg by mouth every 3 (three) days.     tamsulosin (FLOMAX) 0.4 MG CAPS capsule Take 0.4 mg by mouth 2 (two) times daily.      torsemide (DEMADEX) 20 MG tablet Take 3 tablets (60 mg total) by mouth in the morning AND 2 tablets (40 mg total) daily after supper. 150 tablet 3   triamcinolone ointment (KENALOG) 0.1 %      No current facility-administered medications for this visit.    Allergies  Allergen Reactions   Tramadol Other (See Comments)    Dizziness (intolerance) hallucinate   Finasteride Swelling    Breast enlargement    Lisinopril Cough   Ciprofloxacin Other (See Comments)    Dizziness (intolerance)    Simvastatin Hives and Other (See Comments)    Blisters/ Rash    Sulfa Antibiotics Rash     REVIEW OF SYSTEMS:   _0  denotes positive finding, _1  denotes negative finding Cardiac  Comments:  Chest pain or chest pressure:    Shortness of breath upon exertion:    Short of breath when lying flat:    Irregular heart rhythm:        Vascular    Pain in calf, thigh, or hip brought on by ambulation:    Pain in feet at night that wakes you up from your sleep:     Blood clot in your veins:    Leg swelling:         Pulmonary    Oxygen at home:    Productive cough:     Wheezing:         Neurologic    Sudden weakness in  arms or legs:  Sudden numbness in arms or legs:     Sudden onset of difficulty speaking or slurred speech:    Temporary loss of vision in one eye:     Problems with dizziness:         Gastrointestinal    Blood in stool:     Vomited blood:         Genitourinary    Burning when urinating:     Blood in urine:        Psychiatric    Major depression:         Hematologic    Bleeding problems:    Problems with blood clotting too easily:        Skin    Rashes or ulcers:        Constitutional    Fever or chills:      PHYSICAL EXAMINATION:  There were no vitals filed for this visit.  General:  WDWN in NAD; vital signs documented above Gait: Not observed HENT: WNL, normocephalic Pulmonary: normal non-labored breathing , without wheezing Cardiac: regular HR,  Abdomen: soft, NT, no masses Skin: without rashes Vascular Exam/Pulses:  Right Left  Radial 2+ (normal) 2+ (normal)  Ulnar 2+ (normal) 2+ (normal)  Femoral    Popliteal    DP 2+ (normal) 2+ (normal)  PT 2+ (normal) 2+ (normal)   Extremities: without ischemic changes, without Gangrene , without cellulitis; without open wounds;  Musculoskeletal: no muscle wasting or atrophy  Neurologic: A&O X 3;  No focal weakness or paresthesias are detected Psychiatric:  The pt has Normal affect.   Non-Invasive Vascular Imaging:   +-------+-----------+-----------+------------+------------+   ABI/TBI Today's ABI Today's TBI Previous ABI Previous TBI   +-------+-----------+-----------+------------+------------+   Right   1.06        0.71                                    +-------+-----------+-----------+------------+------------+   Left    1.11        0.67                                    +-------+-----------+-----------+------------+------------+     ASSESSMENT/PLAN: Allen Dyer is a 86 y.o. male presenting with bilateral lower extremity neuropathy.  This is likely due to his longstanding diabetes. Imaging was  reviewed demonstrating normal ankle-brachial index.  On physical exam, the patient had palpable pedal pulses bilaterally.  Patient does not appear to have peripheral arterial disease.  We had a long discussion regarding his blood glucose control and sugar management, and discussed how neuropathy is a reversible.  I asked that he call our office immediately should a wound develop on his feet. He can follow-up as needed with our office.  Allen John, MD [facility] [phone]

## 2021-04-29 ENCOUNTER — Ambulatory Visit (HOSPITAL_COMMUNITY)
Admission: RE | Admit: 2021-04-29 | Discharge: 2021-04-29 | Disposition: A | Discharge status: Home / Self Care | Payer: Medicare Other | Source: Ambulatory Visit | Attending: Vascular Surgery | Admitting: Vascular Surgery

## 2021-04-29 ENCOUNTER — Other Ambulatory Visit: Payer: Self-pay

## 2021-04-29 ENCOUNTER — Encounter: Payer: Self-pay | Admitting: Vascular Surgery

## 2021-04-29 ENCOUNTER — Ambulatory Visit (INDEPENDENT_AMBULATORY_CARE_PROVIDER_SITE_OTHER): Payer: Medicare Other | Admitting: Vascular Surgery

## 2021-04-29 VITALS — BP 133/74 | HR 67 | Temp 98.2°F | Resp 20 | Ht 67.0 in | Wt 218.0 lb

## 2021-04-29 DIAGNOSIS — I739 Peripheral vascular disease, unspecified: Secondary | ICD-10-CM | POA: Diagnosis not present

## 2021-04-29 DIAGNOSIS — E1141 Type 2 diabetes mellitus with diabetic mononeuropathy: Secondary | ICD-10-CM | POA: Diagnosis not present

## 2021-05-12 ENCOUNTER — Telehealth: Payer: Self-pay | Admitting: Internal Medicine

## 2021-05-12 ENCOUNTER — Other Ambulatory Visit (HOSPITAL_COMMUNITY): Payer: Self-pay

## 2021-05-12 MED ORDER — ONETOUCH VERIO VI STRP: 1.0000 | ORAL_STRIP | Freq: Every day | 3 refills | Status: DC

## 2021-05-12 NOTE — Addendum Note (Signed)
Addended by: Jefferson Fuel on: 05/12/2021 11:13 AM   Modules accepted: Orders

## 2021-05-12 NOTE — Telephone Encounter (Signed)
[facility]

## 2021-05-12 NOTE — Telephone Encounter (Signed)
MEDICATION: glucose blood (ONETOUCH VERIO) test strip  PHARMACY:   [place], [place] Phone:  [phone]  Fax:  [phone]      HAS THE PATIENT CONTACTED THEIR PHARMACY?  YES  IS THIS A 90 DAY SUPPLY : YES  IS PATIENT OUT OF MEDICATION: YES  IF NOT; HOW MUCH IS LEFT:   LAST APPOINTMENT DATE: @12 /10/2020  NEXT APPOINTMENT DATE:@4 /13/2023  DO WE HAVE YOUR PERMISSION TO LEAVE A DETAILED MESSAGE?:  OTHER COMMENTS: Patient needs prior approval to be sent to [facility] for the above to be filled.   **Let patient know to contact pharmacy at the end of the day to make sure medication is ready. **  ** Please notify patient to allow 48-72 hours to process**  **Encourage patient to contact the pharmacy for refills or they can request refills through [facility]**

## 2021-05-13 ENCOUNTER — Other Ambulatory Visit (HOSPITAL_COMMUNITY): Payer: Self-pay

## 2021-05-13 ENCOUNTER — Telehealth: Payer: Self-pay

## 2021-05-13 NOTE — Telephone Encounter (Signed)
Patient Advocate Encounter   Received notification from [facility] that prior authorization for Blood Test Strips is required by his/her insurance [employer].   PA submitted on 05/13/21  Key#: [id number]  Preferred test strips are Freestyle Lite  Status is pending    [facility] will continue to follow:  Patient Advocate Fax: [phone]

## 2021-05-19 ENCOUNTER — Other Ambulatory Visit (HOSPITAL_COMMUNITY): Payer: Self-pay

## 2021-05-19 MED ORDER — HYDRALAZINE HCL 50 MG PO TABS: 50.0000 mg | ORAL_TABLET | Freq: Three times a day (TID) | ORAL | 0 refills | Status: DC

## 2021-05-24 ENCOUNTER — Other Ambulatory Visit: Payer: Self-pay | Admitting: Internal Medicine

## 2021-05-24 MED ORDER — FREESTYLE LITE TEST VI STRP: 1.0000 | ORAL_STRIP | Freq: Every day | 3 refills | Status: AC

## 2021-05-24 MED ORDER — FREESTYLE LITE W/DEVICE KIT: 1.0000 | PACK | Freq: Every day | 0 refills | Status: AC

## 2021-05-24 NOTE — Telephone Encounter (Signed)
Patient notified and will pick up new meter and strips.

## 2021-05-25 ENCOUNTER — Telehealth: Payer: Self-pay

## 2021-05-25 NOTE — Telephone Encounter (Signed)
Patient can't afford the Freestyle even with his insurance. He will go to [facility] today to see what he can afford and let us know.

## 2021-06-18 ENCOUNTER — Other Ambulatory Visit (HOSPITAL_COMMUNITY): Payer: Self-pay | Admitting: Cardiology

## 2021-06-23 ENCOUNTER — Encounter (HOSPITAL_COMMUNITY): Payer: Self-pay | Admitting: Cardiology

## 2021-06-23 ENCOUNTER — Ambulatory Visit (HOSPITAL_COMMUNITY)
Admission: RE | Admit: 2021-06-23 | Discharge: 2021-06-23 | Disposition: A | Discharge status: Home / Self Care | Payer: Medicare Other | Source: Ambulatory Visit | Attending: Cardiology | Admitting: Cardiology

## 2021-06-23 ENCOUNTER — Other Ambulatory Visit: Payer: Self-pay

## 2021-06-23 VITALS — BP 102/60 | HR 79 | Wt 224.2 lb

## 2021-06-23 DIAGNOSIS — N183 Chronic kidney disease, stage 3 unspecified: Secondary | ICD-10-CM | POA: Diagnosis not present

## 2021-06-23 DIAGNOSIS — I130 Hypertensive heart and chronic kidney disease with heart failure and stage 1 through stage 4 chronic kidney disease, or unspecified chronic kidney disease: Secondary | ICD-10-CM | POA: Diagnosis not present

## 2021-06-23 DIAGNOSIS — I2510 Atherosclerotic heart disease of native coronary artery without angina pectoris: Secondary | ICD-10-CM | POA: Insufficient documentation

## 2021-06-23 DIAGNOSIS — Z8546 Personal history of malignant neoplasm of prostate: Secondary | ICD-10-CM | POA: Insufficient documentation

## 2021-06-23 DIAGNOSIS — I4510 Unspecified right bundle-branch block: Secondary | ICD-10-CM | POA: Insufficient documentation

## 2021-06-23 DIAGNOSIS — Z7984 Long term (current) use of oral hypoglycemic drugs: Secondary | ICD-10-CM | POA: Diagnosis not present

## 2021-06-23 DIAGNOSIS — C9000 Multiple myeloma not having achieved remission: Secondary | ICD-10-CM | POA: Insufficient documentation

## 2021-06-23 DIAGNOSIS — Z79899 Other long term (current) drug therapy: Secondary | ICD-10-CM | POA: Diagnosis not present

## 2021-06-23 DIAGNOSIS — I5022 Chronic systolic (congestive) heart failure: Secondary | ICD-10-CM | POA: Diagnosis not present

## 2021-06-23 DIAGNOSIS — E785 Hyperlipidemia, unspecified: Secondary | ICD-10-CM | POA: Diagnosis not present

## 2021-06-23 DIAGNOSIS — Z9989 Dependence on other enabling machines and devices: Secondary | ICD-10-CM | POA: Diagnosis not present

## 2021-06-23 DIAGNOSIS — Z9981 Dependence on supplemental oxygen: Secondary | ICD-10-CM | POA: Insufficient documentation

## 2021-06-23 DIAGNOSIS — N644 Mastodynia: Secondary | ICD-10-CM | POA: Insufficient documentation

## 2021-06-23 DIAGNOSIS — G4733 Obstructive sleep apnea (adult) (pediatric): Secondary | ICD-10-CM | POA: Diagnosis not present

## 2021-06-23 DIAGNOSIS — I428 Other cardiomyopathies: Secondary | ICD-10-CM | POA: Diagnosis not present

## 2021-06-23 DIAGNOSIS — E1122 Type 2 diabetes mellitus with diabetic chronic kidney disease: Secondary | ICD-10-CM | POA: Diagnosis not present

## 2021-06-23 DIAGNOSIS — I272 Pulmonary hypertension, unspecified: Secondary | ICD-10-CM | POA: Diagnosis not present

## 2021-06-23 DIAGNOSIS — Z7901 Long term (current) use of anticoagulants: Secondary | ICD-10-CM | POA: Diagnosis not present

## 2021-06-23 LAB — BRAIN NATRIURETIC PEPTIDE: B Natriuretic Peptide: 65.5 pg/mL (ref 0.0–100.0)

## 2021-06-23 LAB — BASIC METABOLIC PANEL
Anion gap: 6 (ref 5–15)
BUN: 28 mg/dL — ABNORMAL HIGH (ref 8–23)
CO2: 30 mmol/L (ref 22–32)
Calcium: 9.4 mg/dL (ref 8.9–10.3)
Chloride: 102 mmol/L (ref 98–111)
Creatinine, Ser: 2.29 mg/dL — ABNORMAL HIGH (ref 0.61–1.24)
GFR, Estimated: 27 mL/min — ABNORMAL LOW (ref >60)
Glucose, Bld: 200 mg/dL — ABNORMAL HIGH (ref 70–99)
Potassium: 4.2 mmol/L (ref 3.5–5.1)
Sodium: 138 mmol/L (ref 135–145)

## 2021-06-23 LAB — LIPID PANEL
Cholesterol: 147 mg/dL (ref 0–200)
HDL: 37 mg/dL — ABNORMAL LOW (ref >40)
LDL Cholesterol: 78 mg/dL (ref 0–99)
Total CHOL/HDL Ratio: 4 RATIO
Triglycerides: 158 mg/dL — ABNORMAL HIGH (ref <150)
VLDL: 32 mg/dL (ref 0–40)

## 2021-06-23 MED ORDER — EPLERENONE 50 MG PO TABS: 50.0000 mg | ORAL_TABLET | Freq: Every day | ORAL | 3 refills | Status: DC

## 2021-06-23 MED ORDER — EMPAGLIFLOZIN 10 MG PO TABS: 10.0000 mg | ORAL_TABLET | Freq: Every day | ORAL | 11 refills | Status: DC

## 2021-06-23 MED ORDER — TORSEMIDE 20 MG PO TABS: 40.0000 mg | ORAL_TABLET | Freq: Two times a day (BID) | ORAL | 15 refills | Status: DC

## 2021-06-23 MED ORDER — DAPAGLIFLOZIN PROPANEDIOL 10 MG PO TABS: 10.0000 mg | ORAL_TABLET | Freq: Every day | ORAL | 3 refills | Status: DC

## 2021-06-23 NOTE — Patient Instructions (Signed)
Medication Changes: ? ?STOP Sprionolactone ? ?DECREASE Torsemide to 40 mg Twice daily ? ?START Farxiga 10 mg daily ? ?START Eplerenone 50 mg daily ? ?Lab Work: ? ?Labs done today, your results will be available in MyChart, we will contact you for abnormal readings. ? ? ?Testing/Procedures: ? ?Your physician has requested that you have an echocardiogram. Echocardiography is a painless test that uses sound waves to create images of your heart. It provides your doctor with information about the size and shape of your heart and how well your heart?s chambers and valves are working. This procedure takes approximately one hour. There are no restrictions for this procedure. ? ?Repeat blood work in 10 days  ? ?Referrals: ? ?none ? ?Special Instructions // Education: ? ?none ? ?Follow-Up in: 6 weeks  ? ?At the [facility], you and your health needs are our priority. We have a designated team specialized in the treatment of Heart Failure. This Care Team includes your primary Heart Failure Specialized Cardiologist (physician), Advanced Practice Providers (APPs- Physician Assistants and Nurse Practitioners), and Pharmacist who all work together to provide you with the care you need, when you need it.  ? ?You may see any of the following providers on your designated Care Team at your next follow up: ? ?Dr Glori Bickers ?Dr Loralie Champagne ?Darrick Grinder, NP ?Lyda Jester, PA ?Jessica Milford,NP ?Marlyce Huge, PA ?Audry Riles, PharmD ? ? ?Please be sure to bring in all your medications bottles to every appointment.  ? ?Need to Contact us: ? ?If you have any questions or concerns before your next appointment please send [place] a message through [place] or call our office at [phone].   ? ?TO LEAVE A MESSAGE FOR THE NURSE SELECT OPTION 2, PLEASE LEAVE A MESSAGE INCLUDING: ?YOUR NAME ?DATE OF BIRTH ?CALL BACK NUMBER ?REASON FOR CALL**this is important as we prioritize the call backs ? ?YOU WILL RECEIVE A CALL  BACK THE SAME DAY AS LONG AS YOU CALL BEFORE 4:00 PM ? ? ?

## 2021-06-23 NOTE — Progress Notes (Signed)
Date:  06/23/2021   ID:  Allen Dyer, DOB [birth year], MRN [id number]    Provider location: [facility] Type of Visit: Established patient   PCP:  [name], MD  Cardiologist:  [name], MD HF Cardiology: Dr Aundra Dubin   History of Present Illness: Allen Dyer is a 86 y.o. male with a history of DM, rheumatic fever, HTN, hyperlipidemia, CKD 3, prostate CA s/p XRT, systolic HF due to NICM (EF 30-35%) 05/2018, pulmonary venous HTN, and mild nonobstructive CAD.  He served for > 20 years in the [employer] including in [place].    Admitted 3/11-3/18/20 with volume overload. Diuresed with IV lasix. HF team consulted with concern for pulmonary HTN due to ongoing oxygen need. RHC completed and showed moderate pulmonary venous HTN with normal PVR 1.7 (no PAH). Transitioned from Lasix to torsemide 40 mg BID at DC. Oxygen weaned off. HF medications optimized as able. Limited by CKD. DC weight 213 lbs.   Patient was diagnosed with smoldering myeloma in 7/20, no treatment planned at this time.   Cardiolite in 2/21 showed EF 36%, fixed anteroapical defect, no ischemia.  Echo in 11/21 showed EF 30-35%, diffuse hypokinesis, normal RV, PASP 45 mmHg, dilated IVC.   He returns for followup of CHF.  Weight is up 2 lbs.  Symptomatically doing well, no dyspnea with exertion and able to walk 2 miles.  No chest pain.  No orthopnea/PND.  He does note breast pain, primarily on the right. No mass. He is not taking aspirin due to GI upset.   ECG (personally reviewed): NSR, 1st degree AVB LAFB, RBBB (no changes)  Labs (7/20): creatinine 2.29 => 1.97, K 3.5, hgb 11.6.  Labs (10/20): Creatinine 2.0 Labs (11/20): creatinine 2.5 Labs (1/21): K 3.7, creatinine 2.28 Labs (5/21): K 4, creatinine 2.33 Labs (8/21): K 3.9, creatinine 2.5 Labs (3/22): K 4, creatinine 2.22     PMH: 1. CKD: Stage 3.  2. Hyperlipidemia: Statin intolerant.  3. HTN 4. Smoldering myeloma: Diagnosed in 7/20.   - Bone marrow biopsy stained negative for amyloidosis.  5. Prostate cancer s/p XRT.  6. Type 2 diabetes.  7. Chronic systolic CHF: Nonischemic cardiomyopathy.  - RHC/LHC (2/20): Nonobstructive CAD with 50% pLAD; mean RA 13, PA 72/27 mean 47, mean PCWP 29, CI 2.8, PVR 3 WU - Echo (2/20): EF 30-35%.  - RHC (3/20): mean RA 9, PA 55/20 mean 32, mean PCWP 20, CI 3.29, PVR 1.7 WU - Echo (7/20): EF 25-30%, moderate LV dilation without LVH, normal RV size and systolic function, mild-moderate AI.  - Cardiolite (2/21): EF 36%, fixed anteroapical defect, no ischemia.  - Echo (11/21): EF 30-35%, diffuse hypokinesis, normal RV, PASP 45 mmHg, dilated IVC.  8. Pulmonary venous hypertension.  - V/Q scan (3/20): No evidence for chronic PE.  9. OSA: Using CPAP.  10. ABIs normal in 1/23  Past Surgical History:  Procedure Laterality Date   RIGHT HEART CATH N/A 07/10/2018   Procedure: RIGHT HEART CATH;  Surgeon: Larey Dresser, MD;  Location: [facility];  Service: Cardiovascular;  Laterality: N/A;   RIGHT/LEFT HEART CATH AND CORONARY ANGIOGRAPHY N/A 06/18/2018   Procedure: RIGHT/LEFT HEART CATH AND CORONARY ANGIOGRAPHY;  Surgeon: Troy Sine, MD;  Location: [facility];  Service: Cardiovascular;  Laterality: N/A;     Current Outpatient Medications  Medication Sig Dispense Refill   allopurinol (ZYLOPRIM) 100 MG tablet Take 0.5 tablets by mouth daily.  Ascorbic Acid (VITAMIN C PO) Take 4,000 mg by mouth daily in the afternoon.     Ascorbic Acid (VITAMIN C) 1000 MG tablet 1 tablet     Blood Glucose Monitoring Suppl (FREESTYLE LITE) w/Device KIT 1 Device by Does not apply route daily in the afternoon. 1 kit 0   carvedilol (COREG) 12.5 MG tablet TAKE 1 AND 1/2 TABLETS(18.75 MG) BY MOUTH TWICE DAILY WITH A MEAL 90 tablet 0   Cholecalciferol (VITAMIN D3 PO) Take 1 tablet by mouth daily.      cyanocobalamin 100 MCG tablet Take 100 mcg by mouth daily.      empagliflozin (JARDIANCE) 10 MG  TABS tablet Take 1 tablet (10 mg total) by mouth daily before breakfast. 90 each 11   eplerenone (INSPRA) 50 MG tablet Take 1 tablet (50 mg total) by mouth daily. 90 tablet 3   gabapentin (NEURONTIN) 600 MG tablet Take 0.5 tablets by mouth 2 (two) times daily.     Garlic 5056 MG CAPS Take by mouth.     glipiZIDE (GLUCOTROL) 5 MG tablet Take 1 tablet (5 mg total) by mouth daily before breakfast AND 0.5 tablets (2.5 mg total) daily before supper. 135 tablet 3   glucose blood (FREESTYLE LITE) test strip 1 each by Other route daily in the afternoon. Use as instructed 100 each 3   hydrALAZINE (APRESOLINE) 50 MG tablet Take 1 tablet (50 mg total) by mouth 3 (three) times daily. Please make an appointment for further refills 168 tablet 0   isosorbide mononitrate (IMDUR) 30 MG 24 hr tablet Take 1 tablet by mouth daily.     Multiple Vitamin (MULTIVITAMIN WITH MINERALS) TABS tablet Take 1 tablet by mouth 2 (two) times daily.     Semaglutide (RYBELSUS) 14 MG TABS Take 14 mg by mouth daily. 90 tablet 3   tamsulosin (FLOMAX) 0.4 MG CAPS capsule Take 0.4 mg by mouth 2 (two) times daily.      triamcinolone ointment (KENALOG) 0.1 %      torsemide (DEMADEX) 20 MG tablet Take 2 tablets (40 mg total) by mouth 2 (two) times daily. 90 tablet 15   No current facility-administered medications for this encounter.    Allergies:   Tramadol, Finasteride, Lisinopril, Ciprofloxacin, Simvastatin, and Sulfa antibiotics   Social History:  The patient  reports that he has quit smoking. He has never used smokeless tobacco. He reports that he does not drink alcohol and does not use drugs.   Family History:  The patient's family history includes Cancer in his mother and sister; Congestive Heart Failure in his brother and father.   ROS:  Please see the history of present illness.   All other systems are personally reviewed and negative.  Vitals:   06/23/21 0943  BP: 102/60  Pulse: 79  SpO2: 95%    Exam:   BP 102/60     Pulse 79    Wt 101.7 kg (224 lb 3.2 oz)    SpO2 95%    BMI 35.11 kg/m  General: NAD Neck: No JVD, no thyromegaly or thyroid nodule.  Lungs: Clear to auscultation bilaterally with normal respiratory effort. CV: Nondisplaced PMI.  Heart regular S1/S2, no S3/S4, no murmur.  Trace ankle edema.  No carotid bruit.  Normal pedal pulses.  Abdomen: Soft, nontender, no hepatosplenomegaly, no distention.  Skin: Intact without lesions or rashes.  Neurologic: Alert and oriented x 3.  Psych: Normal affect. Extremities: No clubbing or cyanosis.  HEENT: Normal.   Recent Labs: 07/12/2020: ALT  14; Hemoglobin 10.8; Platelets 152 06/23/2021: B Natriuretic Peptide 65.5; BUN 28; Creatinine, Ser 2.29; Potassium 4.2; Sodium 138  Personally reviewed   Wt Readings from Last 3 Encounters:  06/23/21 101.7 kg (224 lb 3.2 oz)  04/29/21 98.9 kg (218 lb)  03/30/21 100.2 kg (221 lb)     ASSESSMENT AND PLAN:  1. Chronic systolic HF:  New diagnosis 05/2018, EF 30-35% by echo. Due to NICM. LHC 05/2018 with mild non-osbtructive CAD (50% proximal LAD stenosis). Has had high BP for a long time, but it has generally been controlled recently. Had palpitations in past, but very few PVCs on telemetry when in the hospital. Prior hx of ETOH abuse, but none in 40+ years. Did not receive chemotherapy with prior prostate cancer. QRS is wide on EKG, but it is RBBB.  He has multiple myeloma but he does not have ventricular wall thickening that would be suggestive of cardiac amyloidosis.  [place] 3/20 with mildly elevated filling pressures, CO preserved, pulmonary venous HTN. No PAH.  Echo in 7/20 showed that EF remains low at 25-30% with moderate LV dilation, no LVH, and normal RV.  Echo in 11/21 showed EF 30-35%, diffuse hypokinesis, normal RV, PASP 45 mmHg, dilated IVC.  He is doing well in general, NYHA class II symptoms and not volume overloaded on exam.  - He is due to repeat echo, will arrange.  - Start Farxiga 10 mg daily.  - Decrease  torsemide to 40 mg bid.  - No ACE/ARB/ARNI with CKD stage 3.  - Continue Coreg 18.75 mg bid.  - Continue hydralazine 50 mg tid and Imdur 30 mg daily.  - With breast tenderness, will stop spironolactone for a week or so then start eplerenone 50 mg daily.  - BMET/BNP today and BMET in 10 days.   - With advanced age and nonischemic etiology of cardiomyopathy, would avoid ICD.  He has RBBB so would not likely benefit from CRT.  2. CKD Stage 3: Last creatinine 2.22.  - BMET today.  - Sees nephrology.  3. CAD: 50% pLAD on LHC in 2/20. Cardiolite in 2/21 showed no ischemia.   - He has not been able to tolerate statins but LDL has been low.   - He is off aspirin due to GI upset.   4. Severe OSA: Continue CPAP.  5. Pulmonary hypertension: He had pulmonary venous hypertension based on prior RHC.  6. Smoldering myeloma:  No plans for chemotherapy treatment. Sees Dr Alvy Bimler.   See NP/PA in 6 wks      Signed, Loralie Champagne, MD  06/23/2021  [facility] [address] [facility] [place] [address] [phone] (office) [phone] (fax)

## 2021-06-27 DIAGNOSIS — N184 Chronic kidney disease, stage 4 (severe): Secondary | ICD-10-CM | POA: Diagnosis not present

## 2021-06-27 DIAGNOSIS — I5022 Chronic systolic (congestive) heart failure: Secondary | ICD-10-CM | POA: Diagnosis not present

## 2021-06-27 DIAGNOSIS — I10 Essential (primary) hypertension: Secondary | ICD-10-CM | POA: Diagnosis not present

## 2021-06-27 DIAGNOSIS — E1169 Type 2 diabetes mellitus with other specified complication: Secondary | ICD-10-CM | POA: Diagnosis not present

## 2021-06-27 DIAGNOSIS — D472 Monoclonal gammopathy: Secondary | ICD-10-CM | POA: Diagnosis not present

## 2021-06-27 DIAGNOSIS — Z79899 Other long term (current) drug therapy: Secondary | ICD-10-CM | POA: Diagnosis not present

## 2021-06-27 DIAGNOSIS — N1831 Chronic kidney disease, stage 3a: Secondary | ICD-10-CM | POA: Diagnosis not present

## 2021-06-27 DIAGNOSIS — E7800 Pure hypercholesterolemia, unspecified: Secondary | ICD-10-CM | POA: Diagnosis not present

## 2021-07-04 ENCOUNTER — Other Ambulatory Visit (HOSPITAL_COMMUNITY): Payer: TRICARE For Life (TFL)

## 2021-07-05 ENCOUNTER — Ambulatory Visit (HOSPITAL_COMMUNITY)
Admission: RE | Admit: 2021-07-05 | Discharge: 2021-07-05 | Disposition: A | Discharge status: Home / Self Care | Payer: Medicare Other | Source: Ambulatory Visit | Attending: Family Medicine | Admitting: Family Medicine

## 2021-07-05 ENCOUNTER — Other Ambulatory Visit: Payer: Self-pay

## 2021-07-05 ENCOUNTER — Ambulatory Visit (HOSPITAL_COMMUNITY)
Admission: RE | Admit: 2021-07-05 | Discharge: 2021-07-05 | Disposition: A | Discharge status: Home / Self Care | Payer: Medicare Other | Source: Ambulatory Visit | Attending: Cardiology | Admitting: Cardiology

## 2021-07-05 DIAGNOSIS — I110 Hypertensive heart disease with heart failure: Secondary | ICD-10-CM | POA: Diagnosis not present

## 2021-07-05 DIAGNOSIS — E785 Hyperlipidemia, unspecified: Secondary | ICD-10-CM | POA: Diagnosis not present

## 2021-07-05 DIAGNOSIS — I5022 Chronic systolic (congestive) heart failure: Secondary | ICD-10-CM

## 2021-07-05 DIAGNOSIS — E119 Type 2 diabetes mellitus without complications: Secondary | ICD-10-CM | POA: Insufficient documentation

## 2021-07-05 DIAGNOSIS — I351 Nonrheumatic aortic (valve) insufficiency: Secondary | ICD-10-CM | POA: Insufficient documentation

## 2021-07-05 LAB — BASIC METABOLIC PANEL
Anion gap: 6 (ref 5–15)
BUN: 29 mg/dL — ABNORMAL HIGH (ref 8–23)
CO2: 31 mmol/L (ref 22–32)
Calcium: 9.3 mg/dL (ref 8.9–10.3)
Chloride: 102 mmol/L (ref 98–111)
Creatinine, Ser: 2.52 mg/dL — ABNORMAL HIGH (ref 0.61–1.24)
GFR, Estimated: 24 mL/min — ABNORMAL LOW (ref >60)
Glucose, Bld: 122 mg/dL — ABNORMAL HIGH (ref 70–99)
Potassium: 4 mmol/L (ref 3.5–5.1)
Sodium: 139 mmol/L (ref 135–145)

## 2021-07-05 LAB — ECHOCARDIOGRAM COMPLETE
Area-P 1/2: 3.27 cm2
MV M vel: 5.02 m/s
MV Peak grad: 100.8 mmHg
P 1/2 time: 473 msec
Radius: 0.5 cm
S' Lateral: 5.05 cm

## 2021-07-09 DIAGNOSIS — Z20822 Contact with and (suspected) exposure to covid-19: Secondary | ICD-10-CM | POA: Diagnosis not present

## 2021-07-10 DIAGNOSIS — E7800 Pure hypercholesterolemia, unspecified: Secondary | ICD-10-CM | POA: Diagnosis not present

## 2021-07-10 DIAGNOSIS — R21 Rash and other nonspecific skin eruption: Secondary | ICD-10-CM | POA: Diagnosis not present

## 2021-07-10 DIAGNOSIS — Z7984 Long term (current) use of oral hypoglycemic drugs: Secondary | ICD-10-CM | POA: Diagnosis not present

## 2021-07-10 DIAGNOSIS — N3001 Acute cystitis with hematuria: Secondary | ICD-10-CM | POA: Diagnosis not present

## 2021-07-10 DIAGNOSIS — E1169 Type 2 diabetes mellitus with other specified complication: Secondary | ICD-10-CM | POA: Diagnosis not present

## 2021-07-12 ENCOUNTER — Inpatient Hospital Stay: Payer: Medicare Other | Attending: Hematology and Oncology

## 2021-07-12 DIAGNOSIS — Z79899 Other long term (current) drug therapy: Secondary | ICD-10-CM | POA: Insufficient documentation

## 2021-07-12 DIAGNOSIS — D509 Iron deficiency anemia, unspecified: Secondary | ICD-10-CM | POA: Insufficient documentation

## 2021-07-12 DIAGNOSIS — R7989 Other specified abnormal findings of blood chemistry: Secondary | ICD-10-CM | POA: Insufficient documentation

## 2021-07-12 DIAGNOSIS — N184 Chronic kidney disease, stage 4 (severe): Secondary | ICD-10-CM | POA: Insufficient documentation

## 2021-07-12 DIAGNOSIS — D472 Monoclonal gammopathy: Secondary | ICD-10-CM | POA: Insufficient documentation

## 2021-07-12 DIAGNOSIS — D539 Nutritional anemia, unspecified: Secondary | ICD-10-CM | POA: Insufficient documentation

## 2021-07-13 ENCOUNTER — Telehealth (HOSPITAL_COMMUNITY): Payer: Self-pay | Admitting: *Deleted

## 2021-07-13 NOTE — Telephone Encounter (Signed)
If symptoms resolve quickly, he can continue.  If not, stop.  ?

## 2021-07-13 NOTE — Telephone Encounter (Signed)
Pt called stating he was side effects from jardiance. Pt c/o yeast infection, back ache, and "anus problem".  Pt went to PCP and was prescribed a topical cream. Pt asked if he should continue jardiance or not. ? ? ?Routed to [facility]  ?

## 2021-07-14 ENCOUNTER — Inpatient Hospital Stay: Payer: Medicare Other

## 2021-07-14 ENCOUNTER — Telehealth: Payer: Self-pay

## 2021-07-14 ENCOUNTER — Other Ambulatory Visit: Payer: Self-pay

## 2021-07-14 DIAGNOSIS — D472 Monoclonal gammopathy: Secondary | ICD-10-CM

## 2021-07-14 DIAGNOSIS — N184 Chronic kidney disease, stage 4 (severe): Secondary | ICD-10-CM | POA: Diagnosis not present

## 2021-07-14 DIAGNOSIS — D539 Nutritional anemia, unspecified: Secondary | ICD-10-CM | POA: Diagnosis not present

## 2021-07-14 DIAGNOSIS — D509 Iron deficiency anemia, unspecified: Secondary | ICD-10-CM | POA: Diagnosis not present

## 2021-07-14 DIAGNOSIS — Z79899 Other long term (current) drug therapy: Secondary | ICD-10-CM | POA: Diagnosis not present

## 2021-07-14 DIAGNOSIS — R7989 Other specified abnormal findings of blood chemistry: Secondary | ICD-10-CM | POA: Diagnosis not present

## 2021-07-14 LAB — COMPREHENSIVE METABOLIC PANEL
ALT: 14 U/L (ref 0–44)
AST: 15 U/L (ref 15–41)
Albumin: 3.9 g/dL (ref 3.5–5.0)
Alkaline Phosphatase: 68 U/L (ref 38–126)
Anion gap: 8 (ref 5–15)
BUN: 44 mg/dL — ABNORMAL HIGH (ref 8–23)
CO2: 31 mmol/L (ref 22–32)
Calcium: 10 mg/dL (ref 8.9–10.3)
Chloride: 99 mmol/L (ref 98–111)
Creatinine, Ser: 3.03 mg/dL (ref 0.61–1.24)
GFR, Estimated: 20 mL/min — ABNORMAL LOW (ref >60)
Glucose, Bld: 168 mg/dL — ABNORMAL HIGH (ref 70–99)
Potassium: 4.2 mmol/L (ref 3.5–5.1)
Sodium: 138 mmol/L (ref 135–145)
Total Bilirubin: 0.4 mg/dL (ref 0.3–1.2)
Total Protein: 7.6 g/dL (ref 6.5–8.1)

## 2021-07-14 LAB — CBC WITH DIFFERENTIAL/PLATELET
Abs Immature Granulocytes: 0.02 10*3/uL (ref 0.00–0.07)
Basophils Absolute: 0 10*3/uL (ref 0.0–0.1)
Basophils Relative: 0 %
Eosinophils Absolute: 0.3 10*3/uL (ref 0.0–0.5)
Eosinophils Relative: 5 %
HCT: 34.7 % — ABNORMAL LOW (ref 39.0–52.0)
Hemoglobin: 11.2 g/dL — ABNORMAL LOW (ref 13.0–17.0)
Immature Granulocytes: 0 %
Lymphocytes Relative: 23 %
Lymphs Abs: 1.2 10*3/uL (ref 0.7–4.0)
MCH: 32.4 pg (ref 26.0–34.0)
MCHC: 32.3 g/dL (ref 30.0–36.0)
MCV: 100.3 fL — ABNORMAL HIGH (ref 80.0–100.0)
Monocytes Absolute: 0.5 10*3/uL (ref 0.1–1.0)
Monocytes Relative: 9 %
Neutro Abs: 3.3 10*3/uL (ref 1.7–7.7)
Neutrophils Relative %: 63 %
Platelets: 170 10*3/uL (ref 150–400)
RBC: 3.46 MIL/uL — ABNORMAL LOW (ref 4.22–5.81)
RDW: 13.6 % (ref 11.5–15.5)
WBC: 5.4 10*3/uL (ref 4.0–10.5)
nRBC: 0.4 % — ABNORMAL HIGH (ref 0.0–0.2)

## 2021-07-14 NOTE — Telephone Encounter (Signed)
Attempted to call pt to give results and recommendation per MD. LVM for call back; attempted to call both available numbers.  ?

## 2021-07-14 NOTE — Telephone Encounter (Signed)
-----   Message from Heath Lark, MD sent at 07/14/2021  3:34 PM EDT ----- ?His creatinine is a bit higher than baseline ?Please call him and let him know ?He should get in touch with the heart failure clinic in case his medication has to be adjusted ? ?

## 2021-07-14 NOTE — Telephone Encounter (Signed)
CRITICAL VALUE STICKER ? ?CRITICAL VALUE:  creatinine 3.03 ? ?RECEIVER (on-site recipient of call):  [employer], LPN ? ?[place] NOTIFIED: [id number] ? ?MESSENGER (representative from lab):  Ulice Dash ? ?MD NOTIFIED:   Alvy Bimler ? ?TIME OF NOTIFICATION:  [id number] ? ?RESPONSE:  ? ?

## 2021-07-14 NOTE — Telephone Encounter (Signed)
Spoke with pt. And told him of Dr. Claris Gladden advice. ?

## 2021-07-15 ENCOUNTER — Telehealth: Payer: Self-pay

## 2021-07-15 ENCOUNTER — Telehealth (HOSPITAL_COMMUNITY): Payer: Self-pay | Admitting: *Deleted

## 2021-07-15 NOTE — Telephone Encounter (Signed)
Contacted Dr Claris Gladden office per MD to make aware of lab results from 07/14/21. LVM on nurse triage line as no one answered Physician line. Detailed message with pt's MRN, name and DOB as well as lab results and our direct office number.  ?

## 2021-07-15 NOTE — Telephone Encounter (Addendum)
Pt aware and said he will have labs rechecked with oncologist.  ?----- Message from Larey Dresser, MD sent at 07/15/2021 11:04 AM EDT ----- ?I think we'll need to stop his [place] Glade for now.  Repeat BMET in 1 week. ? ?----- Message ----- ?From: Harvie Junior, CMA ?Sent: 07/15/2021   9:08 AM EDT ?To: Larey Dresser, MD ? ?Cancer center called to report pts creatinine 3.03 ? ?

## 2021-07-15 NOTE — Telephone Encounter (Signed)
Attempted to call pt once again to give MD recommendation. VM box is full.  ?

## 2021-07-15 NOTE — Telephone Encounter (Signed)
-----   Message from Heath Lark, MD sent at 07/15/2021  7:57 AM EDT ----- ?Pls call heart failure clinic/Dr. McLean's office to let them know ? ?

## 2021-07-18 LAB — KAPPA/LAMBDA LIGHT CHAINS
Kappa free light chain: 206.6 mg/L — ABNORMAL HIGH (ref 3.3–19.4)
Kappa, lambda light chain ratio: 7.74 — ABNORMAL HIGH (ref 0.26–1.65)
Lambda free light chains: 26.7 mg/L — ABNORMAL HIGH (ref 5.7–26.3)

## 2021-07-19 ENCOUNTER — Inpatient Hospital Stay (HOSPITAL_BASED_OUTPATIENT_CLINIC_OR_DEPARTMENT_OTHER): Payer: Medicare Other | Admitting: Hematology and Oncology

## 2021-07-19 ENCOUNTER — Other Ambulatory Visit: Payer: Self-pay

## 2021-07-19 ENCOUNTER — Telehealth (HOSPITAL_COMMUNITY): Payer: Self-pay | Admitting: *Deleted

## 2021-07-19 ENCOUNTER — Encounter: Payer: Self-pay | Admitting: Hematology and Oncology

## 2021-07-19 DIAGNOSIS — D472 Monoclonal gammopathy: Secondary | ICD-10-CM | POA: Diagnosis not present

## 2021-07-19 DIAGNOSIS — D509 Iron deficiency anemia, unspecified: Secondary | ICD-10-CM | POA: Diagnosis not present

## 2021-07-19 DIAGNOSIS — Z79899 Other long term (current) drug therapy: Secondary | ICD-10-CM | POA: Diagnosis not present

## 2021-07-19 DIAGNOSIS — I5022 Chronic systolic (congestive) heart failure: Secondary | ICD-10-CM

## 2021-07-19 DIAGNOSIS — R7989 Other specified abnormal findings of blood chemistry: Secondary | ICD-10-CM | POA: Diagnosis not present

## 2021-07-19 DIAGNOSIS — N184 Chronic kidney disease, stage 4 (severe): Secondary | ICD-10-CM

## 2021-07-19 DIAGNOSIS — D539 Nutritional anemia, unspecified: Secondary | ICD-10-CM | POA: Diagnosis not present

## 2021-07-19 NOTE — Telephone Encounter (Signed)
Pt left vm stating he stopped Jardiance because side effects have continued.  Pt is also c/o low bp 90s/60s and sometimes feels like he will pass out. Pt asked if his medications need to be adjusted.  ? ?Routed to [place] for advice ?

## 2021-07-19 NOTE — Assessment & Plan Note (Signed)
Overall, I do not believe he has disease progression ?The elevated serum light chains are likely due to recent changes in his renal function ?I recommend repeat blood work in a few months for further follow-up ?He is in agreement ?

## 2021-07-19 NOTE — Telephone Encounter (Signed)
Pt aware and Lab appt scheduled.  ?

## 2021-07-19 NOTE — Telephone Encounter (Signed)
Can try keeping the torsemide like it is (40 bid) but call if he gains weight.  Have him get a BMET.  ?

## 2021-07-19 NOTE — Assessment & Plan Note (Signed)
He has recent exacerbation of kidney function due to his cardiac medications ?We discussed importance of adequate hydration ?I am hopeful his renal function with improved with recent medication changes ?We discussed limitations and difficulties in interpreting his light chain level due to elevated serum creatinine ?I plan to repeat blood work in a few months as above ?

## 2021-07-19 NOTE — Assessment & Plan Note (Signed)
He has multifactorial anemia, likely related to anemia chronic kidney disease Observe closely Previously, I have ordered serum B12 and iron levels and they were within normal range He is not symptomatic 

## 2021-07-19 NOTE — Telephone Encounter (Signed)
If he stops Jardiance, will need to increase torsemide back to 60 qam/40 qpm.  BMET 10 days ?

## 2021-07-19 NOTE — Progress Notes (Signed)
[facility] ?OFFICE PROGRESS NOTE ? ?Patient Care Team: ?[name], MD as PCP - General (Family Medicine) ?[name], MD as PCP - Cardiology (Cardiology) ? ?ASSESSMENT & PLAN:  ?Smoldering multiple myeloma ([place]) ?Overall, I do not believe he has disease progression ?The elevated serum light chains are likely due to recent changes in his renal function ?I recommend repeat blood work in a few months for further follow-up ?He is in agreement ? ?Chronic kidney disease (CKD), stage IV (severe) (HCC) ?He has recent exacerbation of kidney function due to his cardiac medications ?We discussed importance of adequate hydration ?I am hopeful his renal function with improved with recent medication changes ?We discussed limitations and difficulties in interpreting his light chain level due to elevated serum creatinine ?I plan to repeat blood work in a few months as above ? ?Deficiency anemia ?He has multifactorial anemia, likely related to anemia chronic kidney disease ?Observe closely ?Previously, I have ordered serum B12 and iron levels and they were within normal range ?He is not symptomatic ? ?No orders of the defined types were placed in this encounter. ? ? ?All questions were answered. The patient knows to call the clinic with any problems, questions or concerns. ?The total time spent in the appointment was 20 minutes encounter with patients including review of chart and various tests results, discussions about plan of care and coordination of care plan ?  ?Heath Lark, MD ?07/19/2021 3:21 PM ? ?INTERVAL HISTORY: ?Please see below for problem oriented charting. ?he returns for follow-up for abnormal MGUS ?Recently, when he showed up for blood work, he was noted to have elevated serum creatinine ?This is thought to be related to recent changes in his cardiac medications ?He is in communication with cardiologist, [place] Glade and [place] were placed on hold ?He is drinking lots of fluids ?Denies recent  infection of bone pain ? ?REVIEW OF SYSTEMS:   ?Constitutional: Denies fevers, chills or abnormal weight loss ?Eyes: Denies blurriness of vision ?Ears, nose, mouth, throat, and face: Denies mucositis or sore throat ?Respiratory: Denies cough, dyspnea or wheezes ?Cardiovascular: Denies palpitation, chest discomfort or lower extremity swelling ?Gastrointestinal:  Denies nausea, heartburn or change in bowel habits ?Skin: Denies abnormal skin rashes ?Lymphatics: Denies new lymphadenopathy or easy bruising ?Neurological:Denies numbness, tingling or new weaknesses ?Behavioral/Psych: Mood is stable, no new changes  ?All other systems were reviewed with the patient and are negative. ? ?I have reviewed the past medical history, past surgical history, social history and family history with the patient and they are unchanged from previous note. ? ?ALLERGIES:  is allergic to tramadol, finasteride, lisinopril, ciprofloxacin, simvastatin, and sulfa antibiotics. ? ?MEDICATIONS:  ?Current Outpatient Medications  ?Medication Sig Dispense Refill  ? allopurinol (ZYLOPRIM) 100 MG tablet Take 0.5 tablets by mouth daily.    ? Ascorbic Acid (VITAMIN C PO) Take 4,000 mg by mouth daily in the afternoon.    ? Ascorbic Acid (VITAMIN C) 1000 MG tablet 1 tablet    ? Blood Glucose Monitoring Suppl (FREESTYLE LITE) w/Device KIT 1 Device by Does not apply route daily in the afternoon. 1 kit 0  ? carvedilol (COREG) 12.5 MG tablet TAKE 1 AND 1/2 TABLETS(18.75 MG) BY MOUTH TWICE DAILY WITH A MEAL 90 tablet 0  ? Cholecalciferol (VITAMIN D3 PO) Take 1 tablet by mouth daily.     ? cyanocobalamin 100 MCG tablet Take 100 mcg by mouth daily.     ? eplerenone (INSPRA) 50 MG tablet Take 1 tablet (50 mg  total) by mouth daily. 90 tablet 3  ? gabapentin (NEURONTIN) 600 MG tablet Take 0.5 tablets by mouth 2 (two) times daily.    ? Garlic 1610 MG CAPS Take by mouth.    ? glipiZIDE (GLUCOTROL) 5 MG tablet Take 1 tablet (5 mg total) by mouth daily before breakfast  AND 0.5 tablets (2.5 mg total) daily before supper. 135 tablet 3  ? glucose blood (FREESTYLE LITE) test strip 1 each by Other route daily in the afternoon. Use as instructed 100 each 3  ? hydrALAZINE (APRESOLINE) 50 MG tablet Take 1 tablet (50 mg total) by mouth 3 (three) times daily. Please make an appointment for further refills 168 tablet 0  ? isosorbide mononitrate (IMDUR) 30 MG 24 hr tablet Take 1 tablet by mouth daily.    ? Multiple Vitamin (MULTIVITAMIN WITH MINERALS) TABS tablet Take 1 tablet by mouth 2 (two) times daily.    ? Semaglutide (RYBELSUS) 14 MG TABS Take 14 mg by mouth daily. 90 tablet 3  ? tamsulosin (FLOMAX) 0.4 MG CAPS capsule Take 0.4 mg by mouth 2 (two) times daily.     ? torsemide (DEMADEX) 20 MG tablet Take 2 tablets (40 mg total) by mouth 2 (two) times daily. 90 tablet 15  ? triamcinolone ointment (KENALOG) 0.1 %     ? ?No current facility-administered medications for this visit.  ? ? ?SUMMARY OF ONCOLOGIC HISTORY: ?Oncology History  ?Smoldering multiple myeloma  ?10/17/2018 Imaging  ? 1. Interim clearing of congestive heart failure. Persistent cardiomegaly. ?  ?2.  No evidence of metastatic disease or myeloma ?  ?11/04/2018 Bone Marrow Biopsy  ? Bone Marrow, Aspirate,Biopsy, and Clot, right ilium ?- PLASMA CELL NEOPLASM, SEE COMMENT. ?- ATYPICAL MEGAKARYOCYTES. ?PERIPHERAL BLOOD: ?- NORMOCYTIC ANEMIA. ?Diagnosis Note ?The marrow is mildly hypercellular for age with increased plasma cells (15% by CD138 immunohistochemistry). These findings are consistent with a plasma cell neoplasm. Correlation with clinical, radiographic, and laboratory data is required for further classification. There are a few atypical megakaryocytes the significance of which is unclear. ? ?Del 13q noted but normal cytogenetics ?[place] red stain is negative for amyloid ?  ? ? ?PHYSICAL EXAMINATION: ?ECOG PERFORMANCE STATUS: 1 - Symptomatic but completely ambulatory ? ?Vitals:  ? 07/19/21 1129  ?BP: 117/60  ?Pulse: 67   ?Resp: 18  ?Temp: (!) 97.4 ?F (36.3 ?C)  ?SpO2: 100%  ? ?Filed Weights  ? 07/19/21 1129  ?Weight: 216 lb (98 kg)  ? ? ?GENERAL:alert, no distress and comfortable ?NEURO: alert & oriented x 3 with fluent speech, no focal motor/sensory deficits ? ?LABORATORY DATA:  ?I have reviewed the data as listed ?   ?Component Value Date/Time  ? NA 138 07/14/2021 1419  ? NA 143 07/01/2018 1233  ? K 4.2 07/14/2021 1419  ? CL 99 07/14/2021 1419  ? CO2 31 07/14/2021 1419  ? GLUCOSE 168 (H) 07/14/2021 1419  ? BUN 44 (H) 07/14/2021 1419  ? BUN 27 07/01/2018 1233  ? CREATININE 3.03 (HH) 07/14/2021 1419  ? CREATININE 2.06 (H) 10/10/2018 1404  ? CALCIUM 10.0 07/14/2021 1419  ? PROT 7.6 07/14/2021 1419  ? ALBUMIN 3.9 07/14/2021 1419  ? AST 15 07/14/2021 1419  ? AST 20 10/10/2018 1404  ? ALT 14 07/14/2021 1419  ? ALT 16 10/10/2018 1404  ? ALKPHOS 68 07/14/2021 1419  ? BILITOT 0.4 07/14/2021 1419  ? BILITOT 0.4 10/10/2018 1404  ? GFRNONAA 20 (L) 07/14/2021 1419  ? GFRNONAA 29 (L) 10/10/2018 1404  ? GFRAA 26 (  L) 12/02/2019 1108  ? GFRAA 34 (L) 10/10/2018 1404  ? ? ?Lab Results  ?Component Value Date  ? SPEP Comment 10/10/2018  ? ? ?Lab Results  ?Component Value Date  ? WBC 5.4 07/14/2021  ? NEUTROABS 3.3 07/14/2021  ? HGB 11.2 (L) 07/14/2021  ? HCT 34.7 (L) 07/14/2021  ? MCV 100.3 (H) 07/14/2021  ? PLT 170 07/14/2021  ? ? ?  Chemistry   ?   ?Component Value Date/Time  ? NA 138 07/14/2021 1419  ? NA 143 07/01/2018 1233  ? K 4.2 07/14/2021 1419  ? CL 99 07/14/2021 1419  ? CO2 31 07/14/2021 1419  ? BUN 44 (H) 07/14/2021 1419  ? BUN 27 07/01/2018 1233  ? CREATININE 3.03 (HH) 07/14/2021 1419  ? CREATININE 2.06 (H) 10/10/2018 1404  ?    ?Component Value Date/Time  ? CALCIUM 10.0 07/14/2021 1419  ? ALKPHOS 68 07/14/2021 1419  ? AST 15 07/14/2021 1419  ? AST 20 10/10/2018 1404  ? ALT 14 07/14/2021 1419  ? ALT 16 10/10/2018 1404  ? BILITOT 0.4 07/14/2021 1419  ? BILITOT 0.4 10/10/2018 1404  ?  ? ? ? ?RADIOGRAPHIC STUDIES: ?I have personally reviewed  the radiological images as listed and agreed with the findings in the report. ?ECHOCARDIOGRAM COMPLETE ? ?Result Date: 07/05/2021 ?   ECHOCARDIOGRAM REPORT   Patient Name:   Allen Dyer Date of Exam: 3/14

## 2021-07-19 NOTE — Telephone Encounter (Signed)
Pt asked does he need to take that much torsemide because his bp is low and he gets lightheaded and feels like he will pass out.  ?

## 2021-07-20 ENCOUNTER — Ambulatory Visit (HOSPITAL_COMMUNITY)
Admission: RE | Admit: 2021-07-20 | Discharge: 2021-07-20 | Disposition: A | Discharge status: Home / Self Care | Payer: Medicare Other | Source: Ambulatory Visit | Attending: Cardiology | Admitting: Cardiology

## 2021-07-20 DIAGNOSIS — I5022 Chronic systolic (congestive) heart failure: Secondary | ICD-10-CM | POA: Diagnosis not present

## 2021-07-20 LAB — BASIC METABOLIC PANEL
Anion gap: 7 (ref 5–15)
BUN: 40 mg/dL — ABNORMAL HIGH (ref 8–23)
CO2: 28 mmol/L (ref 22–32)
Calcium: 9.2 mg/dL (ref 8.9–10.3)
Chloride: 104 mmol/L (ref 98–111)
Creatinine, Ser: 2.7 mg/dL — ABNORMAL HIGH (ref 0.61–1.24)
GFR, Estimated: 23 mL/min — ABNORMAL LOW (ref >60)
Glucose, Bld: 115 mg/dL — ABNORMAL HIGH (ref 70–99)
Potassium: 4.2 mmol/L (ref 3.5–5.1)
Sodium: 139 mmol/L (ref 135–145)

## 2021-07-20 LAB — MULTIPLE MYELOMA PANEL, SERUM
Albumin SerPl Elph-Mcnc: 3.6 g/dL (ref 2.9–4.4)
Albumin/Glob SerPl: 1.1 (ref 0.7–1.7)
Alpha 1: 0.2 g/dL (ref 0.0–0.4)
Alpha2 Glob SerPl Elph-Mcnc: 0.7 g/dL (ref 0.4–1.0)
B-Globulin SerPl Elph-Mcnc: 1.4 g/dL — ABNORMAL HIGH (ref 0.7–1.3)
Gamma Glob SerPl Elph-Mcnc: 1.1 g/dL (ref 0.4–1.8)
Globulin, Total: 3.4 g/dL (ref 2.2–3.9)
IgA: 886 mg/dL — ABNORMAL HIGH (ref 61–437)
IgG (Immunoglobin G), Serum: 1154 mg/dL (ref 603–1613)
IgM (Immunoglobulin M), Srm: 30 mg/dL (ref 15–143)
M Protein SerPl Elph-Mcnc: 0.5 g/dL — ABNORMAL HIGH
Total Protein ELP: 7 g/dL (ref 6.0–8.5)

## 2021-07-21 ENCOUNTER — Telehealth (HOSPITAL_COMMUNITY): Payer: Self-pay

## 2021-07-21 NOTE — Telephone Encounter (Signed)
No, decrease to 6.25 mg bid.  ?

## 2021-07-21 NOTE — Telephone Encounter (Signed)
Patient currently takes 12.'5mg'$  bid. Ok to drop to 12.'5mg'$  QD?  ?

## 2021-07-21 NOTE — Telephone Encounter (Signed)
Patient called to report that he has been experiencing dizziness and low BP. He reports that before he went on his daily walk(1/2 mile) his BP was 109/58 after walking it was 112/56. He reports that the dizziness started before his walk. He denies chest pain, nausea/vomiting, swelling,syncope. He is currently not alone and his daughter is with him now. He also reports that he has taken all of his medication except for the carvedilol. Please advise.  ?

## 2021-07-21 NOTE — Telephone Encounter (Signed)
The BP numbers he reported are not low.  He can drop Coreg to 12.5 mg bid with subjective dizziness.  ?

## 2021-07-24 ENCOUNTER — Other Ambulatory Visit (HOSPITAL_COMMUNITY): Payer: Self-pay | Admitting: Cardiology

## 2021-07-25 DIAGNOSIS — Z8546 Personal history of malignant neoplasm of prostate: Secondary | ICD-10-CM | POA: Diagnosis not present

## 2021-07-25 DIAGNOSIS — R350 Frequency of micturition: Secondary | ICD-10-CM | POA: Diagnosis not present

## 2021-07-25 DIAGNOSIS — N400 Enlarged prostate without lower urinary tract symptoms: Secondary | ICD-10-CM | POA: Diagnosis not present

## 2021-07-25 DIAGNOSIS — R300 Dysuria: Secondary | ICD-10-CM | POA: Diagnosis not present

## 2021-07-27 ENCOUNTER — Ambulatory Visit (INDEPENDENT_AMBULATORY_CARE_PROVIDER_SITE_OTHER): Payer: Medicare Other | Admitting: Podiatry

## 2021-07-27 ENCOUNTER — Encounter: Payer: Self-pay | Admitting: Podiatry

## 2021-07-27 DIAGNOSIS — B351 Tinea unguium: Secondary | ICD-10-CM

## 2021-07-27 DIAGNOSIS — N183 Chronic kidney disease, stage 3 unspecified: Secondary | ICD-10-CM | POA: Diagnosis not present

## 2021-07-27 DIAGNOSIS — E1142 Type 2 diabetes mellitus with diabetic polyneuropathy: Secondary | ICD-10-CM

## 2021-07-27 DIAGNOSIS — M79674 Pain in right toe(s): Secondary | ICD-10-CM | POA: Diagnosis not present

## 2021-07-27 DIAGNOSIS — M79675 Pain in left toe(s): Secondary | ICD-10-CM

## 2021-07-27 NOTE — Progress Notes (Signed)
This patient returns to my office for at risk foot care.  This patient requires this care by a professional since this patient will be at risk due to having  Diabetes and chronic kidney disease.   This patient is unable to cut nails himself  since the patient cannot reach his  nails.These nails are painful walking and wearing shoes.  This patient presents for at risk foot care today.    General Appearance  Alert, conversant and in no acute stress.  Vascular  Dorsalis pedis and posterior tibial  pulses are palpable  bilaterally.  Capillary return is within normal limits  bilaterally. Temperature is within normal limits  bilaterally.  Neurologic  Senn-Weinstein monofilament wire test diminished   bilaterally. Muscle power within normal limits bilaterally.  Nails Thick disfigured discolored nails with subungual debris  from hallux to fifth toes bilaterally. No evidence of bacterial infection or drainage bilaterally.  Orthopedic  No limitations of motion  feet .  No crepitus or effusions noted.  No bony pathology or digital deformities noted.  Arthritis right foot/ankle.  Skin  normotropic skin with no porokeratosis noted bilaterally.  No signs of infections or ulcers noted.   Asymptomatic porokeratosis sub 5th  B/L.  Onychomycosis  Pain in right toes  Pain in left toes  Consent was obtained for treatment procedures.   Mechanical debridement of nails 1-5  bilaterally performed with a nail nipper.  Filed with dremel without incident. No infection or ulcer.     Return office visit   3 months       Told patient to return for periodic foot care and evaluation due to potential at risk complications.   Elissa Grieshop DPM  

## 2021-08-02 ENCOUNTER — Telehealth (HOSPITAL_COMMUNITY): Payer: Self-pay | Admitting: *Deleted

## 2021-08-02 NOTE — Telephone Encounter (Signed)
Pts daughter left vm asking for a return call to discuss pts low bp. I called back no answer/left vm for return call.  ?

## 2021-08-03 DIAGNOSIS — Z20822 Contact with and (suspected) exposure to covid-19: Secondary | ICD-10-CM | POA: Diagnosis not present

## 2021-08-03 DIAGNOSIS — R051 Acute cough: Secondary | ICD-10-CM | POA: Diagnosis not present

## 2021-08-03 DIAGNOSIS — R059 Cough, unspecified: Secondary | ICD-10-CM | POA: Diagnosis not present

## 2021-08-03 MED ORDER — CARVEDILOL 12.5 MG PO TABS: 6.2500 mg | ORAL_TABLET | Freq: Two times a day (BID) | ORAL | 0 refills | Status: DC

## 2021-08-03 NOTE — Telephone Encounter (Signed)
LATE ENTRY: Patient advised and verbalized understanding. Med list updated to reflect changes.  ? ?

## 2021-08-03 NOTE — Addendum Note (Signed)
Addended byShela Nevin R on: 09/18/7822 10:08 AM ? ? Modules accepted: Orders ? ?

## 2021-08-04 ENCOUNTER — Emergency Department (HOSPITAL_COMMUNITY)
Admission: EM | Admit: 2021-08-04 | Discharge: 2021-08-05 | Disposition: A | Discharge status: Home / Self Care | Payer: Medicare Other | Attending: Emergency Medicine | Admitting: Emergency Medicine

## 2021-08-04 ENCOUNTER — Emergency Department (HOSPITAL_COMMUNITY): Payer: Medicare Other

## 2021-08-04 ENCOUNTER — Encounter (HOSPITAL_COMMUNITY): Payer: Self-pay

## 2021-08-04 ENCOUNTER — Ambulatory Visit: Payer: Medicare Other | Admitting: Internal Medicine

## 2021-08-04 ENCOUNTER — Other Ambulatory Visit: Payer: Self-pay

## 2021-08-04 DIAGNOSIS — R7989 Other specified abnormal findings of blood chemistry: Secondary | ICD-10-CM | POA: Insufficient documentation

## 2021-08-04 DIAGNOSIS — N189 Chronic kidney disease, unspecified: Secondary | ICD-10-CM | POA: Insufficient documentation

## 2021-08-04 DIAGNOSIS — R0789 Other chest pain: Secondary | ICD-10-CM | POA: Diagnosis not present

## 2021-08-04 DIAGNOSIS — J321 Chronic frontal sinusitis: Secondary | ICD-10-CM | POA: Diagnosis not present

## 2021-08-04 DIAGNOSIS — R079 Chest pain, unspecified: Secondary | ICD-10-CM | POA: Insufficient documentation

## 2021-08-04 DIAGNOSIS — H538 Other visual disturbances: Secondary | ICD-10-CM | POA: Insufficient documentation

## 2021-08-04 DIAGNOSIS — R42 Dizziness and giddiness: Secondary | ICD-10-CM | POA: Insufficient documentation

## 2021-08-04 DIAGNOSIS — R600 Localized edema: Secondary | ICD-10-CM | POA: Insufficient documentation

## 2021-08-04 DIAGNOSIS — G319 Degenerative disease of nervous system, unspecified: Secondary | ICD-10-CM | POA: Diagnosis not present

## 2021-08-04 DIAGNOSIS — J320 Chronic maxillary sinusitis: Secondary | ICD-10-CM | POA: Diagnosis not present

## 2021-08-04 DIAGNOSIS — R29818 Other symptoms and signs involving the nervous system: Secondary | ICD-10-CM | POA: Diagnosis not present

## 2021-08-04 LAB — APTT: aPTT: 32 seconds (ref 24–36)

## 2021-08-04 LAB — CBC
HCT: 34.7 % — ABNORMAL LOW (ref 39.0–52.0)
Hemoglobin: 11.3 g/dL — ABNORMAL LOW (ref 13.0–17.0)
MCH: 32.9 pg (ref 26.0–34.0)
MCHC: 32.6 g/dL (ref 30.0–36.0)
MCV: 101.2 fL — ABNORMAL HIGH (ref 80.0–100.0)
Platelets: 179 10*3/uL (ref 150–400)
RBC: 3.43 MIL/uL — ABNORMAL LOW (ref 4.22–5.81)
RDW: 13.7 % (ref 11.5–15.5)
WBC: 5.4 10*3/uL (ref 4.0–10.5)
nRBC: 0 % (ref 0.0–0.2)

## 2021-08-04 LAB — I-STAT CHEM 8, ED
BUN: 24 mg/dL — ABNORMAL HIGH (ref 8–23)
Calcium, Ion: 1.12 mmol/L — ABNORMAL LOW (ref 1.15–1.40)
Chloride: 100 mmol/L (ref 98–111)
Creatinine, Ser: 2.4 mg/dL — ABNORMAL HIGH (ref 0.61–1.24)
Glucose, Bld: 126 mg/dL — ABNORMAL HIGH (ref 70–99)
HCT: 34 % — ABNORMAL LOW (ref 39.0–52.0)
Hemoglobin: 11.6 g/dL — ABNORMAL LOW (ref 13.0–17.0)
Potassium: 3.4 mmol/L — ABNORMAL LOW (ref 3.5–5.1)
Sodium: 139 mmol/L (ref 135–145)
TCO2: 30 mmol/L (ref 22–32)

## 2021-08-04 LAB — DIFFERENTIAL
Abs Immature Granulocytes: 0.02 10*3/uL (ref 0.00–0.07)
Basophils Absolute: 0 10*3/uL (ref 0.0–0.1)
Basophils Relative: 0 %
Eosinophils Absolute: 0.3 10*3/uL (ref 0.0–0.5)
Eosinophils Relative: 5 %
Immature Granulocytes: 0 %
Lymphocytes Relative: 19 %
Lymphs Abs: 1 10*3/uL (ref 0.7–4.0)
Monocytes Absolute: 0.6 10*3/uL (ref 0.1–1.0)
Monocytes Relative: 12 %
Neutro Abs: 3.4 10*3/uL (ref 1.7–7.7)
Neutrophils Relative %: 64 %

## 2021-08-04 LAB — CBG MONITORING, ED: Glucose-Capillary: 97 mg/dL (ref 70–99)

## 2021-08-04 LAB — COMPREHENSIVE METABOLIC PANEL
ALT: 16 U/L (ref 0–44)
AST: 23 U/L (ref 15–41)
Albumin: 3.3 g/dL — ABNORMAL LOW (ref 3.5–5.0)
Alkaline Phosphatase: 51 U/L (ref 38–126)
Anion gap: 9 (ref 5–15)
BUN: 23 mg/dL (ref 8–23)
CO2: 29 mmol/L (ref 22–32)
Calcium: 9.3 mg/dL (ref 8.9–10.3)
Chloride: 101 mmol/L (ref 98–111)
Creatinine, Ser: 2.15 mg/dL — ABNORMAL HIGH (ref 0.61–1.24)
GFR, Estimated: 30 mL/min — ABNORMAL LOW (ref >60)
Glucose, Bld: 129 mg/dL — ABNORMAL HIGH (ref 70–99)
Potassium: 3.3 mmol/L — ABNORMAL LOW (ref 3.5–5.1)
Sodium: 139 mmol/L (ref 135–145)
Total Bilirubin: 0.6 mg/dL (ref 0.3–1.2)
Total Protein: 7.1 g/dL (ref 6.5–8.1)

## 2021-08-04 LAB — TROPONIN I (HIGH SENSITIVITY)
Troponin I (High Sensitivity): 30 ng/L — ABNORMAL HIGH (ref <18)
Troponin I (High Sensitivity): 27 ng/L — ABNORMAL HIGH (ref <18)

## 2021-08-04 LAB — PROTIME-INR
INR: 1.1 (ref 0.8–1.2)
Prothrombin Time: 13.7 seconds (ref 11.4–15.2)

## 2021-08-04 MED ORDER — SODIUM CHLORIDE 0.9% FLUSH: 3.0000 mL | Freq: Once | INTRAVENOUS | Status: DC

## 2021-08-04 NOTE — Progress Notes (Deleted)
?Name: Allen Dyer  ?Age/ Sex: 86 y.o., male   ?MRN/ DOB: [id number], March 06, 1936    ? ?PCP: [name], MD   ?Reason for Endocrinology Evaluation: Type 2 Diabetes Mellitus  ?Initial Endocrine Consultative Visit: 06/21/2020  ? ? ?PATIENT IDENTIFIER: Allen Dyer is a 86 y.o. male with a past medical history of T2DM, HTN, Neuropathy, CHF (04/2018). The patient has followed with Endocrinology clinic since 06/21/2020 for consultative assistance with management of his diabetes. ? ?DIABETIC HISTORY:  ?Allen Dyer was diagnosed with DM > 30 yrs.started Rybelsus 05/2020  . His hemoglobin A1c has ranged from 6.6% in 2020, peaking at 8.1% . ? ? ?SUBJECTIVE:  ? ?During the last visit (03/30/2021): A1c 9.0 % We increased glipizide and increased Rybelsus  ? ?Today (08/04/2021): Allen Dyer is here for a follow up on diabetes management . He checks his blood sugars occasionally  times daily. The patient has not had hypoglycemic episodes since the last clinic visit ? ?Denies nausea or vomiting or diarrhea  ?Weight stable  ?  ? ?HOME DIABETES REGIMEN:  ?Glipizide 5 mg , 1 tablet before Breakfast , half a tablet before supper ?Rybelsus 14 mg , 1 tablet before Breakfast  ? ? ? ? ?Statin: no ?ACE-I/ARB: no ? ? ? ?METER DOWNLOAD SUMMARY:  ?75-240 mg/dL  ? ? ? ?DIABETIC COMPLICATIONS: ?Microvascular complications:  ?Neuropathy, CKD IV ?Denies: retinopathy ?Last Eye Exam: Completed 10/2020 ? ?Macrovascular complications:  ?CHF ?Denies: CAD, CVA, PVD ? ? ?HISTORY:  ?Past Medical History:  ?Past Medical History:  ?Diagnosis Date  ? Chronic combined systolic and diastolic CHF (congestive heart failure) ([place])   ? CKD (chronic kidney disease), stage III ([place])   ? Hyperlipidemia   ? Hypertension   ? Lower extremity edema   ? Mild CAD   ? a. mild-mod by cath 05/2018.  ? Multiple myeloma ([place]) 11/15/2018  ? NICM (nonischemic cardiomyopathy) ([place])   ? Peripheral neuropathy   ? Prostate cancer ([place])   ? Status post XRT  ? Rheumatic fever   ?  Trifascicular block   ? Type 2 diabetes mellitus ([place])   ? ?Past Surgical History:  ?Past Surgical History:  ?Procedure Laterality Date  ? RIGHT HEART CATH N/A 07/10/2018  ? Procedure: RIGHT HEART CATH;  Surgeon: Larey Dresser, MD;  Location: [facility];  Service: Cardiovascular;  Laterality: N/A;  ? RIGHT/LEFT HEART CATH AND CORONARY ANGIOGRAPHY N/A 06/18/2018  ? Procedure: RIGHT/LEFT HEART CATH AND CORONARY ANGIOGRAPHY;  Surgeon: Troy Sine, MD;  Location: [facility];  Service: Cardiovascular;  Laterality: N/A;  ? ?Social History:  reports that he has quit smoking. He has never used smokeless tobacco. He reports that he does not drink alcohol and does not use drugs. ?Family History:  ?Family History  ?Problem Relation Age of Onset  ? Congestive Heart Failure Father   ?     died from heart failure at the age of 28  ? Cancer Mother   ? Cancer Sister   ? Congestive Heart Failure Brother   ?     died from heart failure at the age of 73  ? ? ? ?HOME MEDICATIONS: ?Allergies as of 08/04/2021   ? ?   Reactions  ? Tramadol Other (See Comments)  ? Dizziness (intolerance) ?hallucinate  ? Finasteride Swelling  ? Breast enlargement  ? Lisinopril Cough  ? Ciprofloxacin Other (See Comments)  ? Dizziness (intolerance)  ? Simvastatin Hives, Other (See Comments)  ? Blisters/ Rash  ?  Sulfa Antibiotics Rash  ? ?  ? ?  ?Medication List  ?  ? ?  ? Accurate as of August 04, 2021  7:40 AM. If you have any questions, ask your nurse or doctor.  ?  ?  ? ?  ? ?allopurinol 100 MG tablet ?Commonly known as: ZYLOPRIM ?Take 0.5 tablets by mouth daily. ?  ?carvedilol 12.5 MG tablet ?Commonly known as: COREG ?Take 0.5 tablets (6.25 mg total) by mouth 2 (two) times daily with a meal. ?  ?cyanocobalamin 100 MCG tablet ?Take 100 mcg by mouth daily. ?  ?eplerenone 50 MG tablet ?Commonly known as: INSPRA ?Take 1 tablet (50 mg total) by mouth daily. ?  ?FREESTYLE LITE test strip ?Generic drug: glucose blood ?1 each by Other route  daily in the afternoon. Use as instructed ?  ?FreeStyle Lite w/Device Kit ?1 Device by Does not apply route daily in the afternoon. ?  ?gabapentin 600 MG tablet ?Commonly known as: NEURONTIN ?Take 0.5 tablets by mouth 2 (two) times daily. ?  ?Garlic 5852 MG Caps ?Take by mouth. ?  ?glipiZIDE 5 MG tablet ?Commonly known as: GLUCOTROL ?Take 1 tablet (5 mg total) by mouth daily before breakfast AND 0.5 tablets (2.5 mg total) daily before supper. ?  ?hydrALAZINE 50 MG tablet ?Commonly known as: APRESOLINE ?TAKE 1 TABLET(50 MG) BY MOUTH THREE TIMES DAILY. PLEASE MAKE AN APPOINTMENT FOR FURTHER REFILLS ?  ?isosorbide mononitrate 30 MG 24 hr tablet ?Commonly known as: IMDUR ?Take 1 tablet by mouth daily. ?  ?multivitamin with minerals Tabs tablet ?Take 1 tablet by mouth 2 (two) times daily. ?  ?Rybelsus 14 MG Tabs ?Generic drug: Semaglutide ?Take 14 mg by mouth daily. ?  ?tamsulosin 0.4 MG Caps capsule ?Commonly known as: FLOMAX ?Take 0.4 mg by mouth 2 (two) times daily. ?  ?torsemide 20 MG tablet ?Commonly known as: DEMADEX ?Take 2 tablets (40 mg total) by mouth 2 (two) times daily. ?  ?triamcinolone ointment 0.1 % ?Commonly known as: KENALOG ?  ?vitamin C 1000 MG tablet ?1 tablet ?  ?VITAMIN C PO ?Take 4,000 mg by mouth daily in the afternoon. ?  ?VITAMIN D3 PO ?Take 1 tablet by mouth daily. ?  ? ?  ? ? ? ?OBJECTIVE:  ? ?Vital Signs:There were no vitals taken for this visit. ? ?Wt Readings from Last 3 Encounters:  ?07/19/21 216 lb (98 kg)  ?06/23/21 224 lb 3.2 oz (101.7 kg)  ?04/29/21 218 lb (98.9 kg)  ? ? ? ?Exam: ?General: Pt appears well and is in NAD  ?Lungs: Clear with good BS bilat with no rales, rhonchi, or wheezes  ?Heart: RRR with normal S1 and S2 and no gallops; no murmurs; no rub  ?Abdomen: Normoactive bowel sounds, soft, nontender, without masses or organomegaly palpable  ?Extremities: Trace  pretibial edema. Marland Kitchen  ?Neuro: MS is good with appropriate affect, pt is alert and Ox3  ? ? ?DM foot exam:  03/30/2021 ? ?The skin of the feet is without sores or ulcerations.Toe nails discolored and dystrophic ?The pedal pulses are undetectable on today's exam  ?The sensation is decreased  to a screening 5.07, 10 gram monofilament bilaterally ? ? ? ?DATA REVIEWED: ? ?Lab Results  ?Component Value Date  ? HGBA1C 9.0 (A) 03/30/2021  ? HGBA1C 7.5 (A) 09/22/2020  ? HGBA1C 8.8 (A) 06/21/2020  ? ?Lab Results  ?Component Value Date  ? [place] 78 06/23/2021  ? CREATININE 2.70 (H) 07/20/2021  ? ?No results found for: MICRALBCREAT ? ? ?  Lab Results  ?Component Value Date  ? CHOL 147 06/23/2021  ? HDL 37 (L) 06/23/2021  ? [place] 78 06/23/2021  ? TRIG 158 (H) 06/23/2021  ? CHOLHDL 4.0 06/23/2021  ?     ? ? ?ASSESSMENT / PLAN / RECOMMENDATIONS:  ? ?1) Type 2 Diabetes Mellitus, Poorly controlled, With CKD IV and neuropathic complications - Most recent A1c of 9.0  %. Goal A1c < 7.5 %.   ? ? ?- Worsening glycemic control due to dietary indiscretions  ?- We discussed limiting CHO intake and avoiding sugar-sweetened foods and beverages  ?- He has continued to take 1 tablet of Glipizide before Breakfast rather then half, will add another Glipizide before Supper  ? ? ? ?MEDICATIONS: ?- Increase  Glipizide 5 mg , 1 tablet before Breakfast  and Half a tablet before Supper  ?- Increase  Rybelsus 14 mg , 1 tablet before Breakfast  ? ?EDUCATION / INSTRUCTIONS: ?BG monitoring instructions: Patient is instructed to check his blood sugars 1 times a day, fasting . ?Call [facility] if: BG persistently < 70 . ?I reviewed the Rule of 15 for the treatment of hypoglycemia in detail with the patient. Literature supplied. ? ? ? ? ?2) Diabetic complications:  ?Eye: Does not have known diabetic retinopathy.  ?Neuro/ Feet: Does have known diabetic peripheral neuropathy .  ?Renal: Patient does  have known baseline CKD. He   is not on an ACEI/ARB at present. Will defer to nephrology  ? ? ?3) Decreased Dorsalis Pedis Pulses  ? ? ?- Decrease DP  on exam today could be due to edema  ?- Per  pt gets LE ultrasound every 6 months in the past and is requesting a referral to vascular . ?- I have ordered an ABI as I don't  see one in our system  ? ?F/U in 4 months  ? ? ?Si

## 2021-08-04 NOTE — ED Provider Triage Note (Signed)
Emergency Medicine Provider Triage Evaluation Note ? ?Allen Dyer , a 86 y.o. male  was evaluated in triage.  Pt complains of lightheadedness, but no room spinning sensation for the past month. Was on two new heart medications which his provider thought was causing, and took him off both the medications. Happens with quick head movements as well. Reports he feels like "his equilibrium is off". Additionally he reports chest pain since yesterday left sided, intermittently. Some today about two hours ago. Denies any SOB, diapohoresis. Reports mild nausea. He reports double vision yesterday that lasted 5 minutes. He reports it started when he was looking at his cellphone and then happened again when he was looking at his television.  ? ?Review of Systems  ?Positive: See above ?Negative: See above ? ?Physical Exam  ?BP (!) 142/80 (BP Location: Right Arm)   Pulse 71   Temp 98.9 ?F (37.2 ?C) (Oral)   Resp 16   SpO2 98%  ?Gen:   Awake, no distress   ?Resp:  Normal effort  ?MSK:   Moves extremities without difficulty  ?Other:  Cranial nerves II-XII intact. Strength 5/5. Finger to nose intact. Normal speech. No facial droop.  ? ?Medical Decision Making  ?Medically screening exam initiated at 1:13 PM.  Appropriate orders placed.  Jaeshaun Riva was informed that the remainder of the evaluation will be completed by another provider, this initial triage assessment does not replace that evaluation, and the importance of remaining in the ED until their evaluation is complete. ? ?Will order head CT and chest pain workup.  ?  ?Sherrell Puller, PA-C ?08/04/21 1319 ? ?

## 2021-08-04 NOTE — ED Triage Notes (Signed)
Pt states while he was playing a game on his phone yesterday his vision became blurred for 76mns and again when he was watching tv for 2 mins. Pt states he had a sharp pain left temple about 5 sec. Pt c/o intermittent left sided chest painx2 days. Pt states gets nauseated and SOB with it. NIH 0. Equal hand grip bilat. ?

## 2021-08-05 ENCOUNTER — Emergency Department (HOSPITAL_COMMUNITY): Payer: Medicare Other

## 2021-08-05 DIAGNOSIS — R079 Chest pain, unspecified: Secondary | ICD-10-CM | POA: Diagnosis not present

## 2021-08-05 DIAGNOSIS — R42 Dizziness and giddiness: Secondary | ICD-10-CM | POA: Diagnosis not present

## 2021-08-05 NOTE — ED Notes (Signed)
Pt verbalized understanding of d/c instructions, meds and followup care. Denies questions. VSS, no distress noted. Steady gait to exit with all belongings.  ?

## 2021-08-05 NOTE — Discharge Instructions (Signed)
Please continue drinking fluids. ?Watch your weight.  If weight goes up and you become more dyspneic you may need to cut back. ?Continue your normal home medicines for now. ?Recheck with cardiology as scheduled next week ?Return to the emergency department if you are worse at any time especially increased shortness of breath, chest pain, or more lightheadedness. ?

## 2021-08-05 NOTE — ED Provider Notes (Signed)
?[facility] ?Provider Note ? ? ?CSN: [id number] ?Arrival date & time: 08/04/21  1301 ? ?  ? ?History ? ?Chief Complaint  ?Patient presents with  ? Blurred Vision  ? ? ?Allen Dyer is a 86 y.o. male. ? ?HPI ?86 year old male presents today with episodic vision blurring.  Patient reports recent changes in bp medicine and chf management.  He also endorses episodic light headedness, double vision, sharp chest pain.  The episode of left sharp chest pain occurred two days ago and he describes as fleeting, no other chest discomfort.  Most of the vision changes are described as blurring some lightheadedness.  He states that he did have an episode of double vision several days ago that lasted a couple of minutes and recurred lasting couple of minutes and has since completely resolved.  He did have some similar symptoms like this in the past when his blood pressure was low.  He follows with Dr. Aundra Dubin.  He is recently had an echocardiogram.  He has had medication adjustments.  He reports that his weight has decreased from 220 to 212.  He has not orthopneic.  He has been taking his medications as prescribed and they are divided up throughout the day. ? ?  ? ?Home Medications ?Prior to Admission medications   ?Medication Sig Start Date End Date Taking? Authorizing Provider  ?allopurinol (ZYLOPRIM) 100 MG tablet Take 50 mg by mouth daily. 10/11/20   [provider]  ?Ascorbic Acid (VITAMIN C PO) Take 4,000 mg by mouth daily in the afternoon.    [provider]  ?Ascorbic Acid (VITAMIN C) 1000 MG tablet Take 1,000 mg by mouth daily.    [provider]  ?Blood Glucose Monitoring Suppl (FREESTYLE LITE) w/Device KIT 1 Device by Does not apply route daily in the afternoon. 05/24/21   Shamleffer, Melanie Crazier, MD  ?carvedilol (COREG) 12.5 MG tablet Take 0.5 tablets (6.25 mg total) by mouth 2 (two) times daily with a meal. 08/03/21   Larey Dresser, MD   ?Cholecalciferol (VITAMIN D3 PO) Take 1 tablet by mouth daily.     [provider]  ?cyanocobalamin 100 MCG tablet Take 100 mcg by mouth daily.     [provider]  ?eplerenone (INSPRA) 50 MG tablet Take 1 tablet (50 mg total) by mouth daily. 06/23/21   Larey Dresser, MD  ?gabapentin (NEURONTIN) 600 MG tablet Take 300 mg by mouth 2 (two) times daily. 04/28/20   [provider]  ?Garlic 9937 MG CAPS Take 1,000 mg by mouth daily.    [provider]  ?glipiZIDE (GLUCOTROL) 5 MG tablet Take 1 tablet (5 mg total) by mouth daily before breakfast AND 0.5 tablets (2.5 mg total) daily before supper. 03/30/21   Shamleffer, Melanie Crazier, MD  ?glucose blood (FREESTYLE LITE) test strip 1 each by Other route daily in the afternoon. Use as instructed 05/24/21   Shamleffer, Melanie Crazier, MD  ?hydrALAZINE (APRESOLINE) 50 MG tablet TAKE 1 TABLET(50 MG) BY MOUTH THREE TIMES DAILY. PLEASE MAKE AN APPOINTMENT FOR FURTHER REFILLS ?Patient taking differently: Take 50 mg by mouth 3 (three) times daily. 07/25/21   Larey Dresser, MD  ?isosorbide mononitrate (IMDUR) 30 MG 24 hr tablet Take 30 mg by mouth daily. 08/05/19   [provider]  ?Multiple Vitamin (MULTIVITAMIN WITH MINERALS) TABS tablet Take 1 tablet by mouth 2 (two) times daily.    [provider]  ?Semaglutide (RYBELSUS) 14 MG TABS Take 14 mg by  mouth daily. 03/30/21   Shamleffer, Ibtehal Jaralla, MD  ?tamsulosin (FLOMAX) 0.4 MG CAPS capsule Take 0.4 mg by mouth 2 (two) times daily.  03/24/17   [provider]  ?torsemide (DEMADEX) 20 MG tablet Take 2 tablets (40 mg total) by mouth 2 (two) times daily. 06/23/21   McLean, Dalton S, MD  ?triamcinolone ointment (KENALOG) 0.1 %  03/02/20   [provider]  ?   ? ?Allergies    ?Tramadol, Finasteride, Lisinopril, Ciprofloxacin, Simvastatin, and Sulfa antibiotics   ? ?Review of Systems   ?Review of Systems  ?All other systems reviewed and are negative. ? ?Physical  Exam ?Updated Vital Signs ?BP (!) 168/79   Pulse 81   Temp 98.1 ?F (36.7 ?C) (Oral)   Resp (!) 27   Ht 1.702 m (5' 7")   Wt 98 kg   SpO2 91%   BMI 33.84 kg/m?  ?Physical Exam ?Vitals and nursing note reviewed.  ?Constitutional:   ?   General: He is not in acute distress. ?   Appearance: Normal appearance. He is obese. He is not ill-appearing.  ?HENT:  ?   Head: Normocephalic.  ?   Right Ear: External ear normal.  ?   Left Ear: External ear normal.  ?   Nose: Nose normal.  ?   Mouth/Throat:  ?   Mouth: Mucous membranes are moist.  ?   Pharynx: Oropharynx is clear.  ?Eyes:  ?   Pupils: Pupils are equal, round, and reactive to light.  ?Cardiovascular:  ?   Rate and Rhythm: Normal rate and regular rhythm.  ?Pulmonary:  ?   Effort: Pulmonary effort is normal.  ?   Breath sounds: Normal breath sounds.  ?Abdominal:  ?   General: Abdomen is flat. Bowel sounds are normal.  ?   Palpations: Abdomen is soft.  ?Musculoskeletal:     ?   General: Swelling present. No tenderness or signs of injury. Normal range of motion.  ?   Cervical back: Normal range of motion.  ?   Right lower leg: Edema present.  ?   Left lower leg: Edema present.  ?Skin: ?   General: Skin is warm and dry.  ?   Capillary Refill: Capillary refill takes less than 2 seconds.  ?Neurological:  ?   General: No focal deficit present.  ?   Mental Status: He is alert.  ?Psychiatric:     ?   Mood and Affect: Mood normal.  ? ? ?ED Results / Procedures / Treatments   ?Labs ?(all labs ordered are listed, but only abnormal results are displayed) ?Labs Reviewed  ?CBC - Abnormal; Notable for the following components:  ?    Result Value  ? RBC 3.43 (*)   ? Hemoglobin 11.3 (*)   ? HCT 34.7 (*)   ? MCV 101.2 (*)   ? All other components within normal limits  ?COMPREHENSIVE METABOLIC PANEL - Abnormal; Notable for the following components:  ? Potassium 3.3 (*)   ? Glucose, Bld 129 (*)   ? Creatinine, Ser 2.15 (*)   ? Albumin 3.3 (*)   ? GFR, Estimated 30 (*)   ? All other  components within normal limits  ?I-STAT CHEM 8, ED - Abnormal; Notable for the following components:  ? Potassium 3.4 (*)   ? BUN 24 (*)   ? Creatinine, Ser 2.40 (*)   ? Glucose, Bld 126 (*)   ? Calcium, Ion 1.12 (*)   ? Hemoglobin 11.6 (*)   ?   HCT 34.0 (*)   ? All other components within normal limits  ?TROPONIN I (HIGH SENSITIVITY) - Abnormal; Notable for the following components:  ? Troponin I (High Sensitivity) 30 (*)   ? All other components within normal limits  ?TROPONIN I (HIGH SENSITIVITY) - Abnormal; Notable for the following components:  ? Troponin I (High Sensitivity) 27 (*)   ? All other components within normal limits  ?PROTIME-INR  ?APTT  ?DIFFERENTIAL  ?CBG MONITORING, ED  ? ? ?EKG ?EKG Interpretation ? ?Date/Time:  Thursday August 04 2021 13:12:15 EDT ?Ventricular Rate:  66 ?PR Interval:  242 ?QRS Duration: 174 ?QT Interval:  438 ?QTC Calculation: 459 ?R Axis:   -81 ?Text Interpretation: Sinus rhythm with 1st degree A-V block Right bundle branch block Left anterior fascicular block  Bifascicular block Septal infarct , age undetermined Abnormal ECG When compared with ECG of 23-Jun-2021 10:02, no change Confirmed by Pattricia Boss [phone]) on 08/05/2021 9:25:04 AM ? ?Radiology ?CT HEAD WO CONTRAST ? ?Result Date: 08/04/2021 ?CLINICAL DATA:  Neuro deficit.  Stroke suspected. EXAM: CT HEAD WITHOUT CONTRAST TECHNIQUE: Contiguous axial images were obtained from the base of the skull through the vertex without intravenous contrast. RADIATION DOSE REDUCTION: This exam was performed according to the departmental dose-optimization program which includes automated exposure control, adjustment of the mA and/or kV according to patient size and/or use of iterative reconstruction technique. COMPARISON:  None. FINDINGS: Brain: Mild cerebral atrophy. No evidence for acute hemorrhage, mass lesion, midline shift, hydrocephalus or large infarct. Subtle low-density in the white matter is suggestive for chronic changes.  Vascular: No hyperdense vessel or unexpected calcification. Skull: Normal. Negative for fracture or focal lesion. Sinuses/Orbits: Mild mucosal disease in the right frontal sinus, ethmoid air cells and maxillary sinuses. Mastoid a

## 2021-08-08 DIAGNOSIS — Z20822 Contact with and (suspected) exposure to covid-19: Secondary | ICD-10-CM | POA: Diagnosis not present

## 2021-08-09 DIAGNOSIS — I129 Hypertensive chronic kidney disease with stage 1 through stage 4 chronic kidney disease, or unspecified chronic kidney disease: Secondary | ICD-10-CM | POA: Diagnosis not present

## 2021-08-09 DIAGNOSIS — N390 Urinary tract infection, site not specified: Secondary | ICD-10-CM | POA: Diagnosis not present

## 2021-08-09 DIAGNOSIS — H6122 Impacted cerumen, left ear: Secondary | ICD-10-CM | POA: Diagnosis not present

## 2021-08-09 DIAGNOSIS — D472 Monoclonal gammopathy: Secondary | ICD-10-CM | POA: Diagnosis not present

## 2021-08-09 DIAGNOSIS — R42 Dizziness and giddiness: Secondary | ICD-10-CM | POA: Diagnosis not present

## 2021-08-09 DIAGNOSIS — I5020 Unspecified systolic (congestive) heart failure: Secondary | ICD-10-CM | POA: Diagnosis not present

## 2021-08-09 DIAGNOSIS — E1122 Type 2 diabetes mellitus with diabetic chronic kidney disease: Secondary | ICD-10-CM | POA: Diagnosis not present

## 2021-08-09 DIAGNOSIS — H532 Diplopia: Secondary | ICD-10-CM | POA: Diagnosis not present

## 2021-08-09 DIAGNOSIS — N2581 Secondary hyperparathyroidism of renal origin: Secondary | ICD-10-CM | POA: Diagnosis not present

## 2021-08-09 DIAGNOSIS — N183 Chronic kidney disease, stage 3 unspecified: Secondary | ICD-10-CM | POA: Diagnosis not present

## 2021-08-09 DIAGNOSIS — H93A2 Pulsatile tinnitus, left ear: Secondary | ICD-10-CM | POA: Diagnosis not present

## 2021-08-09 NOTE — Progress Notes (Signed)
?  ? ?Date:  08/10/2021  ? ?ID:  Isaiyah Feldhaus, DOB 04-29-1935, MRN [id number]    ?Provider location: [facility] ?Type of Visit: Established patient  ? ?PCP:  [name], MD  ?Cardiologist:  [name], MD ?Nephrology: Dr. Daiva Huge ?HF Cardiology: Dr Aundra Dubin  ? ?HPI: ?Nazaiah Navarrete is a 86 y.o. male with a history of DM, rheumatic fever, HTN, hyperlipidemia, CKD 3, prostate CA s/p XRT, systolic HF due to NICM (EF 30-35%) 05/2018, pulmonary venous HTN, and mild nonobstructive CAD.  He served for > 20 years in the [employer] including in [place].  ?  ?Admitted 3/11-3/18/20 with volume overload. Diuresed with IV lasix. HF team consulted with concern for pulmonary HTN due to ongoing oxygen need. RHC completed and showed moderate pulmonary venous HTN with normal PVR 1.7 (no PAH). Transitioned from Lasix to torsemide 40 mg BID at DC. Oxygen weaned off. HF medications optimized as able. Limited by CKD. DC weight 213 lbs.  ? ?Patient was diagnosed with smoldering myeloma in 7/20, no treatment planned at this time.  ? ?Cardiolite in 2/21 showed EF 36%, fixed anteroapical defect, no ischemia.  Echo in 11/21 showed EF 30-35%, diffuse hypokinesis, normal RV, PASP 45 mmHg, dilated IVC.  ? ?Follow 3/23, stable NYHA II symptoms and euvolemic. Wilder Glade started and torsemide decreased to 40 mg bid. Eplerenone replaced spiro due to breast tenderness.  ? ?Seen in ED 08/05/21 with blurred vision. Felt to be lightheadedness vs vertigo. Orthostatics negative. He had PCP follow up and was diagnosed with vertigo. ? ?Today he returns for HF follow up. Overall feeling fine. Being treated now for vertigo by his PCP. He does not have dyspnea with exertion and is able to walk 2 miles a day. Denies palpitations, CP, edema, or PND/Orthopnea. Appetite ok. No fever or chills. Weight at home 218 pounds. Taking all medications. He is not taking ASA due to GI upset. Average BP at home 130/77. Wears CPAP at night. Served in  [employer] and retired Development worker, community carrier. ? ?ECG (personally reviewed): none today. ? ?Labs (7/20): creatinine 2.29 => 1.97, K 3.5, hgb 11.6.  ?Labs (10/20): Creatinine 2.0 ?Labs (11/20): creatinine 2.5 ?Labs (1/21): K 3.7, creatinine 2.28 ?Labs (5/21): K 4, creatinine 2.33 ?Labs (8/21): K 3.9, creatinine 2.5 ?Labs (3/23): K 4, creatinine 2.22  ?Labs (4/23): K 3.3, creatinine 2.15 ?   ?PMH: ?1. CKD: Stage 3.  ?2. Hyperlipidemia: Statin intolerant.  ?3. HTN ?4. Smoldering myeloma: Diagnosed in 7/20.  ?- Bone marrow biopsy stained negative for amyloidosis.  ?5. Prostate cancer s/p XRT.  ?6. Type 2 diabetes.  ?7. Chronic systolic CHF: Nonischemic cardiomyopathy.  ?- RHC/LHC (2/20): Nonobstructive CAD with 50% pLAD; mean RA 13, PA 72/27 mean 47, mean PCWP 29, CI 2.8, PVR 3 WU ?- Echo (2/20): EF 30-35%.  ?- RHC (3/20): mean RA 9, PA 55/20 mean 32, mean PCWP 20, CI 3.29, PVR 1.7 WU ?- Echo (7/20): EF 25-30%, moderate LV dilation without LVH, normal RV size and systolic function, mild-moderate AI.  ?- Cardiolite (2/21): EF 36%, fixed anteroapical defect, no ischemia.  ?- Echo (11/21): EF 30-35%, diffuse hypokinesis, normal RV, PASP 45 mmHg, dilated IVC.  ?- Echo (3/23): EF 30-35%, moderately decreased LV with global HK, grade I DD, normal RV, ascending aorta dilation 44 mm. ?8. Pulmonary venous hypertension.  ?- V/Q scan (3/20): No evidence for chronic PE.  ?9. OSA: Using CPAP.  ?10. ABIs normal in 1/23 ? ?Past Surgical History:  ?  Procedure Laterality Date  ? RIGHT HEART CATH N/A 07/10/2018  ? Procedure: RIGHT HEART CATH;  Surgeon: Larey Dresser, MD;  Location: [facility];  Service: Cardiovascular;  Laterality: N/A;  ? RIGHT/LEFT HEART CATH AND CORONARY ANGIOGRAPHY N/A 06/18/2018  ? Procedure: RIGHT/LEFT HEART CATH AND CORONARY ANGIOGRAPHY;  Surgeon: Troy Sine, MD;  Location: [facility];  Service: Cardiovascular;  Laterality: N/A;  ? ? ?Current Outpatient Medications  ?Medication Sig Dispense  Refill  ? allopurinol (ZYLOPRIM) 100 MG tablet Take 50 mg by mouth daily.    ? Ascorbic Acid (VITAMIN C PO) Take 4,000 mg by mouth daily in the afternoon.    ? Blood Glucose Monitoring Suppl (FREESTYLE LITE) w/Device KIT 1 Device by Does not apply route daily in the afternoon. 1 kit 0  ? carvedilol (COREG) 12.5 MG tablet Take 0.5 tablets (6.25 mg total) by mouth 2 (two) times daily with a meal. 90 tablet 0  ? Cholecalciferol (VITAMIN D3 PO) Take 1 tablet by mouth daily.     ? cyanocobalamin 100 MCG tablet Take 100 mcg by mouth daily.     ? eplerenone (INSPRA) 50 MG tablet Take 1 tablet (50 mg total) by mouth daily. 90 tablet 3  ? gabapentin (NEURONTIN) 600 MG tablet Take 300 mg by mouth 2 (two) times daily.    ? Garlic 4174 MG CAPS Take 1,000 mg by mouth daily.    ? glipiZIDE (GLUCOTROL) 5 MG tablet Take 1 tablet (5 mg total) by mouth daily before breakfast AND 0.5 tablets (2.5 mg total) daily before supper. 135 tablet 3  ? glucose blood (FREESTYLE LITE) test strip 1 each by Other route daily in the afternoon. Use as instructed 100 each 3  ? hydrALAZINE (APRESOLINE) 50 MG tablet TAKE 1 TABLET(50 MG) BY MOUTH THREE TIMES DAILY. PLEASE MAKE AN APPOINTMENT FOR FURTHER REFILLS 168 tablet 0  ? isosorbide mononitrate (IMDUR) 30 MG 24 hr tablet Take 30 mg by mouth daily.    ? Multiple Vitamin (MULTIVITAMIN WITH MINERALS) TABS tablet Take 1 tablet by mouth 2 (two) times daily.    ? Semaglutide (RYBELSUS) 14 MG TABS Take 14 mg by mouth daily. 90 tablet 3  ? tamsulosin (FLOMAX) 0.4 MG CAPS capsule Take 0.4 mg by mouth 2 (two) times daily.     ? torsemide (DEMADEX) 20 MG tablet Take 2 tablets (40 mg total) by mouth 2 (two) times daily. 90 tablet 15  ? triamcinolone ointment (KENALOG) 0.1 %     ? ?No current facility-administered medications for this encounter.  ? ? ?Allergies:   Tramadol, Finasteride, Lisinopril, Ciprofloxacin, Simvastatin, and Sulfa antibiotics  ? ?Social History:  The patient  reports that he has quit  smoking. His smoking use included cigarettes. He has never used smokeless tobacco. He reports that he does not drink alcohol and does not use drugs.  ? ?Family History:  The patient's family history includes Cancer in his mother and sister; Congestive Heart Failure in his brother and father.  ? ?ROS:  Please see the history of present illness.   All other systems are personally reviewed and negative.  ? ?Wt Readings from Last 3 Encounters:  ?08/10/21 98.9 kg  ?08/04/21 98 kg  ?07/19/21 98 kg  ? ?BP (!) 148/78   Pulse 73   Wt 98.9 kg   BMI 34.14 kg/m?  ? ?Physical Exam:   ?General:  NAD. No resp difficulty ?HEENT: Normal ?Neck: Supple. No JVD. Carotids 2+ bilat; no bruits.  No lymphadenopathy or thryomegaly appreciated. ?Cor: PMI nondisplaced. Regular rate & rhythm. No rubs, gallops or murmurs. ?Lungs: Clear ?Abdomen: Obese, soft, nontender, nondistended. No hepatosplenomegaly. No bruits or masses. Good bowel sounds. ?Extremities: No cyanosis, clubbing, rash, edema, compression hose on. ?Neuro: Alert & oriented x 3, cranial nerves grossly intact. Moves all 4 extremities w/o difficulty. Affect pleasant. ? ?Recent Labs: ?06/23/2021: B Natriuretic Peptide 65.5 ?08/04/2021: ALT 16; BUN 24; Creatinine, Ser 2.40; Hemoglobin 11.6; Platelets 179; Potassium 3.4; Sodium 139  ?Personally reviewed  ? ?ASSESSMENT AND PLAN: ?1. Chronic systolic HF:  New diagnosis 05/2018, EF 30-35% by echo. Due to NICM. LHC 05/2018 with mild non-osbtructive CAD (50% proximal LAD stenosis). Has had high BP for a long time, but it has generally been controlled recently. Had palpitations in past, but very few PVCs on telemetry when in the hospital. Prior hx of ETOH abuse, but none in 40+ years. Did not receive chemotherapy with prior prostate cancer. QRS is wide on EKG, but it is RBBB.  He has multiple myeloma but he does not have ventricular wall thickening that would be suggestive of cardiac amyloidosis.  [place] 3/20 with mildly elevated filling  pressures, CO preserved, pulmonary venous HTN. No PAH.  Echo in 7/20 showed that EF remains low at 25-30% with moderate LV dilation, no LVH, and normal RV.  Echo in 11/21 showed EF 30-35%, diffuse hypokinesis, normal RV

## 2021-08-10 ENCOUNTER — Encounter (HOSPITAL_COMMUNITY): Payer: Self-pay

## 2021-08-10 ENCOUNTER — Ambulatory Visit (HOSPITAL_COMMUNITY)
Admission: RE | Admit: 2021-08-10 | Discharge: 2021-08-10 | Disposition: A | Discharge status: Home / Self Care | Payer: Medicare Other | Source: Ambulatory Visit | Attending: Family Medicine | Admitting: Family Medicine

## 2021-08-10 VITALS — BP 148/78 | HR 73 | Wt 218.0 lb

## 2021-08-10 DIAGNOSIS — Z9989 Dependence on other enabling machines and devices: Secondary | ICD-10-CM | POA: Insufficient documentation

## 2021-08-10 DIAGNOSIS — Z8546 Personal history of malignant neoplasm of prostate: Secondary | ICD-10-CM | POA: Diagnosis not present

## 2021-08-10 DIAGNOSIS — D472 Monoclonal gammopathy: Secondary | ICD-10-CM | POA: Diagnosis not present

## 2021-08-10 DIAGNOSIS — I4510 Unspecified right bundle-branch block: Secondary | ICD-10-CM | POA: Insufficient documentation

## 2021-08-10 DIAGNOSIS — I2510 Atherosclerotic heart disease of native coronary artery without angina pectoris: Secondary | ICD-10-CM

## 2021-08-10 DIAGNOSIS — C9000 Multiple myeloma not having achieved remission: Secondary | ICD-10-CM | POA: Diagnosis not present

## 2021-08-10 DIAGNOSIS — G4733 Obstructive sleep apnea (adult) (pediatric): Secondary | ICD-10-CM | POA: Diagnosis not present

## 2021-08-10 DIAGNOSIS — R42 Dizziness and giddiness: Secondary | ICD-10-CM | POA: Diagnosis not present

## 2021-08-10 DIAGNOSIS — Z79899 Other long term (current) drug therapy: Secondary | ICD-10-CM | POA: Diagnosis not present

## 2021-08-10 DIAGNOSIS — E1122 Type 2 diabetes mellitus with diabetic chronic kidney disease: Secondary | ICD-10-CM | POA: Diagnosis not present

## 2021-08-10 DIAGNOSIS — I5022 Chronic systolic (congestive) heart failure: Secondary | ICD-10-CM | POA: Diagnosis not present

## 2021-08-10 DIAGNOSIS — N183 Chronic kidney disease, stage 3 unspecified: Secondary | ICD-10-CM | POA: Insufficient documentation

## 2021-08-10 DIAGNOSIS — N1832 Chronic kidney disease, stage 3b: Secondary | ICD-10-CM

## 2021-08-10 DIAGNOSIS — I130 Hypertensive heart and chronic kidney disease with heart failure and stage 1 through stage 4 chronic kidney disease, or unspecified chronic kidney disease: Secondary | ICD-10-CM | POA: Diagnosis not present

## 2021-08-10 DIAGNOSIS — I428 Other cardiomyopathies: Secondary | ICD-10-CM | POA: Diagnosis not present

## 2021-08-10 DIAGNOSIS — Z923 Personal history of irradiation: Secondary | ICD-10-CM | POA: Insufficient documentation

## 2021-08-10 DIAGNOSIS — I272 Pulmonary hypertension, unspecified: Secondary | ICD-10-CM | POA: Diagnosis not present

## 2021-08-10 DIAGNOSIS — E785 Hyperlipidemia, unspecified: Secondary | ICD-10-CM | POA: Diagnosis not present

## 2021-08-10 LAB — BASIC METABOLIC PANEL
Anion gap: 8 (ref 5–15)
BUN: 22 mg/dL (ref 8–23)
CO2: 30 mmol/L (ref 22–32)
Calcium: 9.5 mg/dL (ref 8.9–10.3)
Chloride: 102 mmol/L (ref 98–111)
Creatinine, Ser: 2.19 mg/dL — ABNORMAL HIGH (ref 0.61–1.24)
GFR, Estimated: 29 mL/min — ABNORMAL LOW (ref >60)
Glucose, Bld: 69 mg/dL — ABNORMAL LOW (ref 70–99)
Potassium: 3.4 mmol/L — ABNORMAL LOW (ref 3.5–5.1)
Sodium: 140 mmol/L (ref 135–145)

## 2021-08-10 MED ORDER — CARVEDILOL 6.25 MG PO TABS: 9.3750 mg | ORAL_TABLET | Freq: Two times a day (BID) | ORAL | 6 refills | Status: DC

## 2021-08-10 NOTE — Patient Instructions (Signed)
Increase Carvedilol to 9.375 mg Twice daily, we have sent you in a new prescription for Carvedilol 6.25 mg tablets, please take 1 & 1/2 tablets Twice daily  ? ?Labs done today, your results will be available in MyChart, we will contact you for abnormal readings. ? ?Your physician has requested that you regularly monitor and record your blood pressure readings at home. Please use the same machine at the same time of day to check your readings and record them to bring to your follow-up visit. ? ?Your physician recommends that you schedule a follow-up appointment in: 5-6 months, **PLEASE CALL OUR OFFICE IN AUGUST TO SCHEDULE THIS APPOINTMENT ? ?If you have any questions or concerns before your next appointment please send [place] a message through [place] or call our office at [phone].   ? ?TO LEAVE A MESSAGE FOR THE NURSE SELECT OPTION 2, PLEASE LEAVE A MESSAGE INCLUDING: ?YOUR NAME ?DATE OF BIRTH ?CALL BACK NUMBER ?REASON FOR CALL**this is important as we prioritize the call backs ? ?YOU WILL RECEIVE A CALL BACK THE SAME DAY AS LONG AS YOU CALL BEFORE 4:00 PM ? ?At the [facility], you and your health needs are our priority. As part of our continuing mission to provide you with exceptional heart care, we have created designated Provider Care Teams. These Care Teams include your primary Cardiologist (physician) and Advanced Practice Providers (APPs- Physician Assistants and Nurse Practitioners) who all work together to provide you with the care you need, when you need it.  ? ?You may see any of the following providers on your designated Care Team at your next follow up: ?Dr Glori Bickers ?Dr Loralie Champagne ?Darrick Grinder, NP ?Lyda Jester, PA ?Jessica Milford,NP ?Marlyce Huge, PA ?Audry Riles, PharmD ? ? ?Please be sure to bring in all your medications bottles to every appointment.  ? ? ?

## 2021-08-11 ENCOUNTER — Telehealth (HOSPITAL_COMMUNITY): Payer: Self-pay | Admitting: Cardiology

## 2021-08-11 ENCOUNTER — Telehealth: Payer: Self-pay

## 2021-08-11 DIAGNOSIS — I5022 Chronic systolic (congestive) heart failure: Secondary | ICD-10-CM

## 2021-08-11 DIAGNOSIS — R42 Dizziness and giddiness: Secondary | ICD-10-CM | POA: Diagnosis not present

## 2021-08-11 DIAGNOSIS — H6123 Impacted cerumen, bilateral: Secondary | ICD-10-CM | POA: Diagnosis not present

## 2021-08-11 DIAGNOSIS — H903 Sensorineural hearing loss, bilateral: Secondary | ICD-10-CM | POA: Diagnosis not present

## 2021-08-11 MED ORDER — POTASSIUM CHLORIDE CRYS ER 20 MEQ PO TBCR: 20.0000 meq | EXTENDED_RELEASE_TABLET | Freq: Every day | ORAL | 3 refills | Status: DC

## 2021-08-11 NOTE — Telephone Encounter (Signed)
Patient called.  Patient aware.  

## 2021-08-11 NOTE — Telephone Encounter (Signed)
-----   Message from Rafael Bihari, [place] sent at 08/10/2021  4:28 PM EDT ----- ?Kidney function stable. Potassium is low. Please start 20 mEq of KCL daily. Repeat BMET in 7-10 days. ?

## 2021-08-11 NOTE — Patient Outreach (Signed)
Mr. Euceda transferred over to [facility] [place] Management through the [facility]. ?I have assigned Raina Mina, RN to call within 10 business days for follow up and Case Management needs.  ?  ?Arville Care, CBCS, CMAA ?[place] Management Assistant ?[facility] ?[phone]   ?

## 2021-08-15 ENCOUNTER — Other Ambulatory Visit: Payer: Self-pay | Admitting: *Deleted

## 2021-08-15 ENCOUNTER — Encounter: Payer: Self-pay | Admitting: *Deleted

## 2021-08-15 NOTE — Patient Outreach (Signed)
?[facility]) Care Management ?Telephonic RN Care Manager Note ? ? ?08/15/2021 ?Name:  Allen Dyer MRN:  [id number] DOB:  06-11-1935 ? ?Summary: ?Pt was enrolled and entered into the heart failure program, Educated accordingly and completed the initial assessment. Pt verified he would participate and was very excited for all discussed today.  ? ?Recommendations/Changes made from today's visit: ?Encouraged pt to weight daily and document all reading for his providers to view. Will verify pt's awareness on what to do if acute symptoms should occur related to fluid retention. ? ?Subjective: ?Allen Dyer is an 86 y.o. year old male who is a primary patient of Koirala, Dibas, MD. The care management team was consulted for assistance with care management and/or care coordination needs.   ? ?Telephonic RN Care Manager completed Telephone Visit today. ? ?Objective:  ? ?Medications Reviewed Today   ? ? Reviewed by Tobi Bastos, RN (Registered Nurse) on 08/15/21 at 46  Med List Status: <None>  ? ?Medication Order Taking? Sig Documenting Provider Last Dose Status Informant  ?allopurinol (ZYLOPRIM) 100 MG tablet [id number] Yes Take 50 mg by mouth daily. [provider] Taking Active   ?Ascorbic Acid (VITAMIN C PO) [id number] Yes Take 4,000 mg by mouth daily in the afternoon. [provider] Taking Active   ?Blood Glucose Monitoring Suppl (FREESTYLE LITE) w/Device KIT [id number] Yes 1 Device by Does not apply route daily in the afternoon. Shamleffer, Melanie Crazier, MD Taking Active   ?carvedilol (COREG) 6.25 MG tablet [id number] Yes Take 1.5 tablets (9.375 mg total) by mouth 2 (two) times daily with a meal. Milford, Maricela Bo, FNP Taking Active   ?Cholecalciferol (VITAMIN D3 PO) [id number] Yes Take 1 tablet by mouth daily.  [provider] Taking Active Self  ?cyanocobalamin 100 MCG tablet [id number] Yes Take 100 mcg by mouth daily.  [provider] Taking Active    ?eplerenone (INSPRA) 50 MG tablet [id number] Yes Take 1 tablet (50 mg total) by mouth daily. Larey Dresser, MD Taking Active   ?gabapentin (NEURONTIN) 600 MG tablet [id number] Yes Take 300 mg by mouth 2 (two) times daily. [provider] Taking Active   ?Garlic 9924 MG CAPS [id number] Yes Take 1,000 mg by mouth daily. [provider] Taking Active   ?glipiZIDE (GLUCOTROL) 5 MG tablet [id number] Yes Take 1 tablet (5 mg total) by mouth daily before breakfast AND 0.5 tablets (2.5 mg total) daily before supper. Shamleffer, Melanie Crazier, MD Taking Active   ?glucose blood (FREESTYLE LITE) test strip [id number] Yes 1 each by Other route daily in the afternoon. Use as instructed Shamleffer, Melanie Crazier, MD Taking Active   ?hydrALAZINE (APRESOLINE) 50 MG tablet [id number] Yes TAKE 1 TABLET(50 MG) BY MOUTH THREE TIMES DAILY. PLEASE MAKE AN APPOINTMENT FOR FURTHER REFILLS Larey Dresser, MD Taking Active   ?isosorbide mononitrate (IMDUR) 30 MG 24 hr tablet [id number] Yes Take 30 mg by mouth daily. [provider] Taking Active   ?Multiple Vitamin (MULTIVITAMIN WITH MINERALS) TABS tablet [id number] Yes Take 1 tablet by mouth 2 (two) times daily. [provider] Taking Active Self  ?potassium chloride SA (KLOR-CON M) 20 MEQ tablet [id number] Yes Take 1 tablet (20 mEq total) by mouth daily. Milford, Maricela Bo, FNP Taking Active   ?Semaglutide (RYBELSUS) 14 MG TABS [id number] Yes Take 14 mg by mouth daily. Shamleffer, Melanie Crazier, MD Taking Active   ?tamsulosin (FLOMAX) 0.4 MG CAPS capsule [id number] Yes Take 0.4 mg by mouth 2 (two) times daily.  [provider] Taking Active Self  ?torsemide (DEMADEX) 20 MG tablet [id number] Yes Take 2 tablets (40 mg total) by mouth 2 (two) times daily. Larey Dresser, MD Taking Active   ?triamcinolone ointment (KENALOG) 0.1 % [id number] Yes  [provider] Taking Active   ? ?  ?  ? ?  ? ? ? ?SDOH:  (Social Determinants of Health)  assessments and interventions performed:  ?SDOH Interventions   ? ?Flowsheet Row Most Recent Value  ?SDOH Interventions   ?Food Insecurity Interventions Intervention Not Indicated  ?Transportation Interventions Intervention Not Indicated  ? ?  ? ? ? ?Care Plan ? ?Review of patient past medical history, allergies, medications, health status, including review of consultants reports, laboratory and other test data, was performed as part of comprehensive evaluation for care management services.  ? ?Care Plan : RN Care Manager plan of care  ?Updates made by Tobi Bastos, RN since 08/15/2021 12:00 AM  ?  ? ?Problem: Knowledge deficit related to CHF and care coordination needs   ?Priority: High  ?  ? ?Long-Range Goal: Development plan of care for management  of CHF   ?Start Date: 08/15/2021  ?Expected End Date: 02/21/2022  ?Priority: High  ?Note:   ?Current Barriers:  ?Knowledge Deficits related to plan of care for management of CHF  ? ?RNCM Clinical Goal(s):  ?Patient will verbalize basic understanding of  CHF disease process and self health management plan as evidenced by self report ?take all medications exactly as prescribed and will call provider for medication related questions as evidenced by self report and chart review  through collaboration with RN Care manager, provider, and care team.  ? ?Interventions: ?Inter-disciplinary care team collaboration (see longitudinal plan of care) ?Evaluation of current treatment plan related to  self management and patient's adherence to plan as established by provider ? ? ?Heart Failure Interventions:  (Status:  New goal.) Long Term Goal ?Basic overview and discussion of pathophysiology of Heart Failure reviewed ?Provided education on low sodium diet ?Reviewed Heart Failure Action Plan in depth and provided written copy ?Assessed need for readable accurate scales in home ?Provided education about placing scale on hard, flat surface ?Advised patient to weigh each morning after  emptying bladder ?Discussed importance of daily weight and advised patient to weigh and record daily ?Reviewed role of diuretics in prevention of fluid overload and management of heart failure; ?Discussed the importance of keeping all appointments with provider ?Provided patient with education about the role of exercise in the management of heart failure ?Screening for signs and symptoms of depression related to chronic disease state  ? ?08/15/2021 Pt reports today baseline weight is 218 lbs with no acute symptoms or fluid retention noted. ? ?Patient Goals/Self-Care Activities: ?Take all medications as prescribed ?Attend all scheduled provider appointments ?Call pharmacy for medication refills 3-7 days in advance of running out of medications ?Attend church or other social activities ?Perform all self care activities independently  ?Perform IADL's (shopping, preparing meals, housekeeping, managing finances) independently ?Call provider office for new concerns or questions  ?call office if I gain more than 2 pounds in one day or 5 pounds in one week ?do ankle pumps when sitting ?keep legs up while sitting ?track weight in diary ?use salt in moderation ?watch for swelling in feet, ankles and legs every day ?weigh myself daily ?begin a heart failure diary ?develop a rescue plan ?follow rescue plan if symptoms flare-up ?know when to call the doctor:with fluid retention as educated today related to  HF zones ?track symptoms and what helps feel better or worse ? ?Follow Up Plan:  Telephone follow up appointment with care management team member scheduled for:  May 2023 ?The patient has been provided with contact information for the care management team and has been advised to call with any health related questions or concerns.   ?  ? ? ? ?Raina Mina, RN ?Care Management Coordinator ?[facility] ?Main Office [phone]  ? ?

## 2021-08-15 NOTE — Patient Instructions (Signed)
Visit Information ? ?Thank you for taking time to visit with me today. Please don't hesitate to contact me if I can be of assistance to you before our next scheduled telephone appointment. ? ?Following are the goals we discussed today:  ? Take all medications as prescribed ?Attend all scheduled provider appointments ?Call pharmacy for medication refills 3-7 days in advance of running out of medications ?Attend church or other social activities ?Perform all self care activities independently  ?Perform IADL's (shopping, preparing meals, housekeeping, managing finances) independently ?Call provider office for new concerns or questions  ?call office if I gain more than 2 pounds in one day or 5 pounds in one week ?do ankle pumps when sitting ?keep legs up while sitting ?track weight in diary ?use salt in moderation ?watch for swelling in feet, ankles and legs every day ?weigh myself daily ?begin a heart failure diary ?develop a rescue plan ?follow rescue plan if symptoms flare-up ?know when to call the doctor:with fluid retention as educated today related to HF zones ?track symptoms and what helps feel better or worse ?

## 2021-08-17 DIAGNOSIS — Z20822 Contact with and (suspected) exposure to covid-19: Secondary | ICD-10-CM | POA: Diagnosis not present

## 2021-08-23 ENCOUNTER — Ambulatory Visit (HOSPITAL_COMMUNITY)
Admission: RE | Admit: 2021-08-23 | Discharge: 2021-08-23 | Disposition: A | Discharge status: Home / Self Care | Payer: Medicare Other | Source: Ambulatory Visit | Attending: Internal Medicine | Admitting: Internal Medicine

## 2021-08-23 DIAGNOSIS — I5022 Chronic systolic (congestive) heart failure: Secondary | ICD-10-CM

## 2021-08-23 LAB — BASIC METABOLIC PANEL
Anion gap: 9 (ref 5–15)
BUN: 33 mg/dL — ABNORMAL HIGH (ref 8–23)
CO2: 31 mmol/L (ref 22–32)
Calcium: 9.6 mg/dL (ref 8.9–10.3)
Chloride: 103 mmol/L (ref 98–111)
Creatinine, Ser: 2.45 mg/dL — ABNORMAL HIGH (ref 0.61–1.24)
GFR, Estimated: 25 mL/min — ABNORMAL LOW (ref >60)
Glucose, Bld: 155 mg/dL — ABNORMAL HIGH (ref 70–99)
Potassium: 3.7 mmol/L (ref 3.5–5.1)
Sodium: 143 mmol/L (ref 135–145)

## 2021-08-27 DIAGNOSIS — Z20822 Contact with and (suspected) exposure to covid-19: Secondary | ICD-10-CM | POA: Diagnosis not present

## 2021-08-29 DIAGNOSIS — Z20822 Contact with and (suspected) exposure to covid-19: Secondary | ICD-10-CM | POA: Diagnosis not present

## 2021-09-01 DIAGNOSIS — H8113 Benign paroxysmal vertigo, bilateral: Secondary | ICD-10-CM | POA: Diagnosis not present

## 2021-09-08 ENCOUNTER — Other Ambulatory Visit: Payer: Self-pay | Admitting: Family Medicine

## 2021-09-08 DIAGNOSIS — R4701 Aphasia: Secondary | ICD-10-CM | POA: Diagnosis not present

## 2021-09-11 ENCOUNTER — Inpatient Hospital Stay (HOSPITAL_COMMUNITY)
Admission: EM | Admit: 2021-09-11 | Discharge: 2021-09-13 | DRG: 064 | Disposition: A | Discharge status: Home Health | Payer: Medicare Other | Attending: Internal Medicine | Admitting: Internal Medicine

## 2021-09-11 ENCOUNTER — Other Ambulatory Visit (HOSPITAL_COMMUNITY): Payer: TRICARE For Life (TFL)

## 2021-09-11 ENCOUNTER — Ambulatory Visit
Admission: RE | Admit: 2021-09-11 | Discharge: 2021-09-11 | Disposition: A | Discharge status: Home / Self Care | Payer: Medicare Other | Source: Ambulatory Visit | Attending: Family Medicine | Admitting: Family Medicine

## 2021-09-11 ENCOUNTER — Other Ambulatory Visit: Payer: Self-pay

## 2021-09-11 ENCOUNTER — Inpatient Hospital Stay (HOSPITAL_COMMUNITY): Payer: Medicare Other

## 2021-09-11 ENCOUNTER — Encounter (HOSPITAL_COMMUNITY): Payer: Self-pay | Admitting: Emergency Medicine

## 2021-09-11 DIAGNOSIS — Z6829 Body mass index (BMI) 29.0-29.9, adult: Secondary | ICD-10-CM

## 2021-09-11 DIAGNOSIS — E1122 Type 2 diabetes mellitus with diabetic chronic kidney disease: Secondary | ICD-10-CM | POA: Diagnosis present

## 2021-09-11 DIAGNOSIS — E669 Obesity, unspecified: Secondary | ICD-10-CM | POA: Diagnosis not present

## 2021-09-11 DIAGNOSIS — Z87891 Personal history of nicotine dependence: Secondary | ICD-10-CM

## 2021-09-11 DIAGNOSIS — E1159 Type 2 diabetes mellitus with other circulatory complications: Secondary | ICD-10-CM | POA: Diagnosis present

## 2021-09-11 DIAGNOSIS — I6523 Occlusion and stenosis of bilateral carotid arteries: Secondary | ICD-10-CM | POA: Diagnosis not present

## 2021-09-11 DIAGNOSIS — Z809 Family history of malignant neoplasm, unspecified: Secondary | ICD-10-CM | POA: Diagnosis not present

## 2021-09-11 DIAGNOSIS — I63543 Cerebral infarction due to unspecified occlusion or stenosis of bilateral cerebellar arteries: Secondary | ICD-10-CM | POA: Diagnosis present

## 2021-09-11 DIAGNOSIS — Z881 Allergy status to other antibiotic agents status: Secondary | ICD-10-CM

## 2021-09-11 DIAGNOSIS — Z8546 Personal history of malignant neoplasm of prostate: Secondary | ICD-10-CM | POA: Diagnosis not present

## 2021-09-11 DIAGNOSIS — I10 Essential (primary) hypertension: Secondary | ICD-10-CM | POA: Diagnosis not present

## 2021-09-11 DIAGNOSIS — Z923 Personal history of irradiation: Secondary | ICD-10-CM | POA: Diagnosis not present

## 2021-09-11 DIAGNOSIS — E1142 Type 2 diabetes mellitus with diabetic polyneuropathy: Secondary | ICD-10-CM | POA: Diagnosis present

## 2021-09-11 DIAGNOSIS — N184 Chronic kidney disease, stage 4 (severe): Secondary | ICD-10-CM | POA: Diagnosis present

## 2021-09-11 DIAGNOSIS — E785 Hyperlipidemia, unspecified: Secondary | ICD-10-CM | POA: Diagnosis present

## 2021-09-11 DIAGNOSIS — I2510 Atherosclerotic heart disease of native coronary artery without angina pectoris: Secondary | ICD-10-CM | POA: Diagnosis present

## 2021-09-11 DIAGNOSIS — R29700 NIHSS score 0: Secondary | ICD-10-CM | POA: Diagnosis present

## 2021-09-11 DIAGNOSIS — C9000 Multiple myeloma not having achieved remission: Secondary | ICD-10-CM | POA: Diagnosis not present

## 2021-09-11 DIAGNOSIS — Z8249 Family history of ischemic heart disease and other diseases of the circulatory system: Secondary | ICD-10-CM

## 2021-09-11 DIAGNOSIS — I272 Pulmonary hypertension, unspecified: Secondary | ICD-10-CM | POA: Diagnosis present

## 2021-09-11 DIAGNOSIS — Z888 Allergy status to other drugs, medicaments and biological substances status: Secondary | ICD-10-CM

## 2021-09-11 DIAGNOSIS — Z66 Do not resuscitate: Secondary | ICD-10-CM | POA: Diagnosis present

## 2021-09-11 DIAGNOSIS — I6622 Occlusion and stenosis of left posterior cerebral artery: Secondary | ICD-10-CM | POA: Diagnosis not present

## 2021-09-11 DIAGNOSIS — I130 Hypertensive heart and chronic kidney disease with heart failure and stage 1 through stage 4 chronic kidney disease, or unspecified chronic kidney disease: Secondary | ICD-10-CM | POA: Diagnosis present

## 2021-09-11 DIAGNOSIS — I639 Cerebral infarction, unspecified: Secondary | ICD-10-CM | POA: Diagnosis present

## 2021-09-11 DIAGNOSIS — I6389 Other cerebral infarction: Secondary | ICD-10-CM | POA: Diagnosis not present

## 2021-09-11 DIAGNOSIS — I63212 Cerebral infarction due to unspecified occlusion or stenosis of left vertebral arteries: Secondary | ICD-10-CM | POA: Diagnosis present

## 2021-09-11 DIAGNOSIS — Z7984 Long term (current) use of oral hypoglycemic drugs: Secondary | ICD-10-CM

## 2021-09-11 DIAGNOSIS — D472 Monoclonal gammopathy: Secondary | ICD-10-CM | POA: Diagnosis present

## 2021-09-11 DIAGNOSIS — R4701 Aphasia: Secondary | ICD-10-CM

## 2021-09-11 DIAGNOSIS — I672 Cerebral atherosclerosis: Secondary | ICD-10-CM | POA: Diagnosis not present

## 2021-09-11 DIAGNOSIS — I453 Trifascicular block: Secondary | ICD-10-CM | POA: Diagnosis not present

## 2021-09-11 DIAGNOSIS — Z885 Allergy status to narcotic agent status: Secondary | ICD-10-CM

## 2021-09-11 DIAGNOSIS — I6503 Occlusion and stenosis of bilateral vertebral arteries: Secondary | ICD-10-CM | POA: Diagnosis not present

## 2021-09-11 DIAGNOSIS — Z8673 Personal history of transient ischemic attack (TIA), and cerebral infarction without residual deficits: Secondary | ICD-10-CM | POA: Diagnosis not present

## 2021-09-11 DIAGNOSIS — R42 Dizziness and giddiness: Secondary | ICD-10-CM | POA: Diagnosis not present

## 2021-09-11 DIAGNOSIS — I428 Other cardiomyopathies: Secondary | ICD-10-CM | POA: Diagnosis present

## 2021-09-11 DIAGNOSIS — I771 Stricture of artery: Secondary | ICD-10-CM | POA: Diagnosis not present

## 2021-09-11 DIAGNOSIS — Z79899 Other long term (current) drug therapy: Secondary | ICD-10-CM

## 2021-09-11 DIAGNOSIS — I720 Aneurysm of carotid artery: Secondary | ICD-10-CM | POA: Diagnosis not present

## 2021-09-11 DIAGNOSIS — I5042 Chronic combined systolic (congestive) and diastolic (congestive) heart failure: Secondary | ICD-10-CM | POA: Diagnosis present

## 2021-09-11 DIAGNOSIS — Z882 Allergy status to sulfonamides status: Secondary | ICD-10-CM

## 2021-09-11 LAB — COMPREHENSIVE METABOLIC PANEL
ALT: 15 U/L (ref 0–44)
AST: 18 U/L (ref 15–41)
Albumin: 3.2 g/dL — ABNORMAL LOW (ref 3.5–5.0)
Alkaline Phosphatase: 54 U/L (ref 38–126)
Anion gap: 5 (ref 5–15)
BUN: 20 mg/dL (ref 8–23)
CO2: 31 mmol/L (ref 22–32)
Calcium: 9.1 mg/dL (ref 8.9–10.3)
Chloride: 103 mmol/L (ref 98–111)
Creatinine, Ser: 2.32 mg/dL — ABNORMAL HIGH (ref 0.61–1.24)
GFR, Estimated: 27 mL/min — ABNORMAL LOW (ref >60)
Glucose, Bld: 140 mg/dL — ABNORMAL HIGH (ref 70–99)
Potassium: 3.7 mmol/L (ref 3.5–5.1)
Sodium: 139 mmol/L (ref 135–145)
Total Bilirubin: 0.7 mg/dL (ref 0.3–1.2)
Total Protein: 7 g/dL (ref 6.5–8.1)

## 2021-09-11 LAB — CBC
HCT: 33.8 % — ABNORMAL LOW (ref 39.0–52.0)
Hemoglobin: 11.3 g/dL — ABNORMAL LOW (ref 13.0–17.0)
MCH: 33.8 pg (ref 26.0–34.0)
MCHC: 33.4 g/dL (ref 30.0–36.0)
MCV: 101.2 fL — ABNORMAL HIGH (ref 80.0–100.0)
Platelets: 143 10*3/uL — ABNORMAL LOW (ref 150–400)
RBC: 3.34 MIL/uL — ABNORMAL LOW (ref 4.22–5.81)
RDW: 13.5 % (ref 11.5–15.5)
WBC: 5.2 10*3/uL (ref 4.0–10.5)
nRBC: 0 % (ref 0.0–0.2)

## 2021-09-11 LAB — DIFFERENTIAL
Abs Immature Granulocytes: 0.01 10*3/uL (ref 0.00–0.07)
Basophils Absolute: 0 10*3/uL (ref 0.0–0.1)
Basophils Relative: 0 %
Eosinophils Absolute: 0.3 10*3/uL (ref 0.0–0.5)
Eosinophils Relative: 6 %
Immature Granulocytes: 0 %
Lymphocytes Relative: 20 %
Lymphs Abs: 1 10*3/uL (ref 0.7–4.0)
Monocytes Absolute: 0.6 10*3/uL (ref 0.1–1.0)
Monocytes Relative: 11 %
Neutro Abs: 3.2 10*3/uL (ref 1.7–7.7)
Neutrophils Relative %: 63 %

## 2021-09-11 LAB — RAPID URINE DRUG SCREEN, HOSP PERFORMED
Amphetamines: NOT DETECTED
Barbiturates: NOT DETECTED
Benzodiazepines: NOT DETECTED
Cocaine: NOT DETECTED
Opiates: NOT DETECTED
Tetrahydrocannabinol: NOT DETECTED

## 2021-09-11 LAB — GLUCOSE, CAPILLARY: Glucose-Capillary: 149 mg/dL — ABNORMAL HIGH (ref 70–99)

## 2021-09-11 LAB — PROTIME-INR
INR: 1 (ref 0.8–1.2)
Prothrombin Time: 13.5 seconds (ref 11.4–15.2)

## 2021-09-11 LAB — APTT: aPTT: 29 seconds (ref 24–36)

## 2021-09-11 MED ORDER — ACETAMINOPHEN 160 MG/5ML PO SOLN: 650.0000 mg | ORAL | Status: DC | PRN

## 2021-09-11 MED ORDER — ONDANSETRON HCL 4 MG/2ML IJ SOLN: 4.0000 mg | Freq: Four times a day (QID) | INTRAMUSCULAR | Status: DC | PRN

## 2021-09-11 MED ORDER — ASPIRIN 300 MG RE SUPP: 300.0000 mg | Freq: Every day | RECTAL | Status: DC

## 2021-09-11 MED ORDER — SODIUM CHLORIDE 0.9% FLUSH: 3.0000 mL | Freq: Once | INTRAVENOUS | Status: DC

## 2021-09-11 MED ORDER — ENOXAPARIN SODIUM 40 MG/0.4ML IJ SOSY
40.0000 mg | PREFILLED_SYRINGE | INTRAMUSCULAR | Status: DC
  Administered 2021-09-11: 40 mg via SUBCUTANEOUS
  Filled 2021-09-11: qty 0.4

## 2021-09-11 MED ORDER — LACTATED RINGERS IV SOLN
INTRAVENOUS | Status: DC
Start: 2021-09-11 — End: 2021-09-11

## 2021-09-11 MED ORDER — INSULIN ASPART 100 UNIT/ML IJ SOLN: 0.0000 [IU] | Freq: Every day | INTRAMUSCULAR | Status: DC

## 2021-09-11 MED ORDER — SENNOSIDES-DOCUSATE SODIUM 8.6-50 MG PO TABS: 1.0000 | ORAL_TABLET | Freq: Every evening | ORAL | Status: DC | PRN

## 2021-09-11 MED ORDER — ASPIRIN 325 MG PO TABS
325.0000 mg | ORAL_TABLET | Freq: Every day | ORAL | Status: DC
  Administered 2021-09-11 – 2021-09-12 (×2): 325 mg via ORAL
  Filled 2021-09-11 (×2): qty 1

## 2021-09-11 MED ORDER — ALLOPURINOL 100 MG PO TABS
50.0000 mg | ORAL_TABLET | Freq: Every day | ORAL | Status: DC
  Administered 2021-09-12 – 2021-09-13 (×2): 50 mg via ORAL
  Filled 2021-09-11 (×2): qty 1

## 2021-09-11 MED ORDER — GABAPENTIN 300 MG PO CAPS
300.0000 mg | ORAL_CAPSULE | Freq: Two times a day (BID) | ORAL | Status: DC
  Administered 2021-09-11 – 2021-09-13 (×4): 300 mg via ORAL
  Filled 2021-09-11 (×4): qty 1

## 2021-09-11 MED ORDER — STROKE: EARLY STAGES OF RECOVERY BOOK
Freq: Once | Status: AC
  Filled 2021-09-11: qty 1

## 2021-09-11 MED ORDER — ISOSORBIDE MONONITRATE ER 30 MG PO TB24
30.0000 mg | ORAL_TABLET | Freq: Every day | ORAL | Status: DC
  Administered 2021-09-12 – 2021-09-13 (×2): 30 mg via ORAL
  Filled 2021-09-11 (×2): qty 1

## 2021-09-11 MED ORDER — ENOXAPARIN SODIUM 30 MG/0.3ML IJ SOSY
30.0000 mg | PREFILLED_SYRINGE | INTRAMUSCULAR | Status: DC
  Administered 2021-09-12 – 2021-09-13 (×2): 30 mg via SUBCUTANEOUS
  Filled 2021-09-11 (×2): qty 0.3

## 2021-09-11 MED ORDER — INSULIN ASPART 100 UNIT/ML IJ SOLN
0.0000 [IU] | Freq: Three times a day (TID) | INTRAMUSCULAR | Status: DC
  Administered 2021-09-12: 3 [IU] via SUBCUTANEOUS

## 2021-09-11 MED ORDER — ACETAMINOPHEN 650 MG RE SUPP: 650.0000 mg | RECTAL | Status: DC | PRN

## 2021-09-11 MED ORDER — ACETAMINOPHEN 325 MG PO TABS: 650.0000 mg | ORAL_TABLET | ORAL | Status: DC | PRN

## 2021-09-11 NOTE — Consult Note (Addendum)
Neurology Consultation  Reason for Consult: Cerebrovascular Vascular Accident Referring Physician: Pattricia Boss , MD  CC: Dizziness  History is obtained from: Patient   HPI: Allen Dyer is a 86 y.o. male with past medical history for DM, HTN,HLD,CKD III, Systolic CHF with LVEF 24-26% ,Pulmonary Venous Hypertension and mild non obstructive CAD. Patient was instructed to be seen @ [facility] after outpatient MRI obtained for symptoms of dizziness revealed bilateral acute-subacute cerebellar infarctions. Patient states that 3 days ago he started to experience dizziness, which he described as the room spinning around and then had difficulty with his speech. He was not able to form full sentences, with word finding difficulty. He states symptoms initially started 3-4 weeks ago and during that time he had been evaluated @ ED and it was felt at that time that the lightheadedness was not  due to any neurological event. Pt at the time did not have any focal neurological deficits.   He was reffered to outpatient ENT  and underwent "head movement exercises" (most likely Dix-Halpike and Epley maneuvers) with some improvement. However, the patient states that on the following Thursday his symptoms returned which included nausea, vomiting, dizziness and aphasia. His symptoms were intermittent. He then called PCP and was advised that he would need imaging studies.  Patient was sent to Outpatient Radiology and MRI Brain (non-con) revealed multiple Acute to Subacute bilateral Cerebellar and AICA territory infarcts with superimposed mild distal right PCA and possible left Thalamostriate artery  ischemia (not associated with hemorrhage or mass effect). Patient was instructed to be seen in ED for further evaluation and possible admission.   Patient at this time has intermittent dizziness and right sided facial /cheek numbness. Denies any headaches, speech difficulty, visual changes, swallowing difficulty, tingling ,  palpitations or weakness. Patient denies Antiplatelet or Statin Therapy.  tpa given?: no, out of the time window Premorbid modified Rankin scale (mRS): 0   ROS: Full ROS was performed and is negative except as noted in the HPI.  Past Medical History:  Diagnosis Date   Chronic combined systolic and diastolic CHF (congestive heart failure) (HCC)    CKD (chronic kidney disease), stage III (HCC)    Hyperlipidemia    Hypertension    Lower extremity edema    Mild CAD    a. mild-mod by cath 05/2018.   Multiple myeloma ([place]) 11/15/2018   NICM (nonischemic cardiomyopathy) ([place])    Peripheral neuropathy    Prostate cancer (HCC)    Status post XRT   Rheumatic fever    Trifascicular block    Type 2 diabetes mellitus ([place])         Family History  Problem Relation Age of Onset   Cancer Mother    Congestive Heart Failure Father        died from heart failure at the age of 46   Cancer Sister    Congestive Heart Failure Brother        died from heart failure at the age of 24   Stroke Neg Hx      Social History:   reports that he quit smoking about 50 years ago. His smoking use included cigarettes. He has a 20.00 pack-year smoking history. He has never used smokeless tobacco. He reports that he does not currently use alcohol. He reports that he does not use drugs.   Medications  Current Facility-Administered Medications:     stroke: early stages of recovery book, , Does not apply, Once, Karmen Bongo, MD  acetaminophen (TYLENOL) tablet 650 mg, 650 mg, Oral, Q4H PRN **OR** acetaminophen (TYLENOL) 160 MG/5ML solution 650 mg, 650 mg, Per Tube, Q4H PRN **OR** acetaminophen (TYLENOL) suppository 650 mg, 650 mg, Rectal, Q4H PRN, Karmen Bongo, MD   Derrill Memo ON 09/12/2021] allopurinol (ZYLOPRIM) tablet 50 mg, 50 mg, Oral, Daily, Karmen Bongo, MD   aspirin suppository 300 mg, 300 mg, Rectal, Daily **OR** aspirin tablet 325 mg, 325 mg, Oral, Daily, Karmen Bongo, MD, 325 mg at 09/11/21  1245   enoxaparin (LOVENOX) injection 40 mg, 40 mg, Subcutaneous, Q24H, Karmen Bongo, MD, 40 mg at 09/11/21 1245   gabapentin (NEURONTIN) capsule 300 mg, 300 mg, Oral, BID, Karmen Bongo, MD   insulin aspart (novoLOG) injection 0-5 Units, 0-5 Units, Subcutaneous, QHS, Karmen Bongo, MD   insulin aspart (novoLOG) injection 0-9 Units, 0-9 Units, Subcutaneous, TID WC, Karmen Bongo, MD   Derrill Memo ON 09/12/2021] isosorbide mononitrate (IMDUR) 24 hr tablet 30 mg, 30 mg, Oral, Daily, Karmen Bongo, MD   ondansetron [facility]) injection 4 mg, 4 mg, Intravenous, Q6H PRN, Karmen Bongo, MD   senna-docusate (Senokot-S) tablet 1 tablet, 1 tablet, Oral, QHS PRN, Karmen Bongo, MD  Current Outpatient Medications:    allopurinol (ZYLOPRIM) 100 MG tablet, Take 50 mg by mouth every morning., Disp: , Rfl:    carvedilol (COREG) 6.25 MG tablet, Take 1.5 tablets (9.375 mg total) by mouth 2 (two) times daily with a meal., Disp: 90 tablet, Rfl: 6   eplerenone (INSPRA) 50 MG tablet, Take 1 tablet (50 mg total) by mouth daily. (Patient taking differently: Take 50 mg by mouth every morning.), Disp: 90 tablet, Rfl: 3   gabapentin (NEURONTIN) 600 MG tablet, Take 300 mg by mouth every morning., Disp: , Rfl:    glipiZIDE (GLUCOTROL) 5 MG tablet, Take 1 tablet (5 mg total) by mouth daily before breakfast AND 0.5 tablets (2.5 mg total) daily before supper., Disp: 135 tablet, Rfl: 3   hydrALAZINE (APRESOLINE) 50 MG tablet, TAKE 1 TABLET(50 MG) BY MOUTH THREE TIMES DAILY. PLEASE MAKE AN APPOINTMENT FOR FURTHER REFILLS (Patient taking differently: Take 50 mg by mouth 3 (three) times daily. 6am, 2pm and 10pm), Disp: 168 tablet, Rfl: 0   isosorbide mononitrate (IMDUR) 30 MG 24 hr tablet, Take 30 mg by mouth every morning., Disp: , Rfl:    potassium chloride SA (KLOR-CON M) 20 MEQ tablet, Take 1 tablet (20 mEq total) by mouth daily. (Patient taking differently: Take 20 mEq by mouth every morning.), Disp: 90 tablet, Rfl: 3    Semaglutide (RYBELSUS) 14 MG TABS, Take 14 mg by mouth daily. (Patient taking differently: Take 14 mg by mouth every morning.), Disp: 90 tablet, Rfl: 3   tamsulosin (FLOMAX) 0.4 MG CAPS capsule, Take 0.4 mg by mouth 2 (two) times daily. , Disp: , Rfl:    torsemide (DEMADEX) 20 MG tablet, Take 2 tablets (40 mg total) by mouth 2 (two) times daily., Disp: 90 tablet, Rfl: 15   Ascorbic Acid (VITAMIN C) 1000 MG tablet, Take 4,000 mg by mouth every morning. (Patient not taking: Reported on 09/11/2021), Disp: , Rfl:    Blood Glucose Monitoring Suppl (FREESTYLE LITE) w/Device KIT, 1 Device by Does not apply route daily in the afternoon., Disp: 1 kit, Rfl: 0   Cholecalciferol (VITAMIN D3 PO), Take 1 capsule by mouth every morning. (Patient not taking: Reported on 09/11/2021), Disp: , Rfl:    Garlic 0865 MG CAPS, Take 1,000 mg by mouth every morning. (Patient not taking: Reported on 09/11/2021), Disp: , Rfl:  glucose blood (FREESTYLE LITE) test strip, 1 each by Other route daily in the afternoon. Use as instructed, Disp: 100 each, Rfl: 3   Multiple Vitamin (MULTIVITAMIN WITH MINERALS) TABS tablet, Take 1 tablet by mouth every morning. (Patient not taking: Reported on 09/11/2021), Disp: , Rfl:    vitamin B-12 (CYANOCOBALAMIN) 500 MCG tablet, Take 500 mcg by mouth every morning. (Patient not taking: Reported on 09/11/2021), Disp: , Rfl:    Exam: Current vital signs: BP (!) 107/57   Pulse 69   Temp 98.5 F (36.9 C) (Oral)   Resp 13   SpO2 91%  Vital signs in last 24 hours: Temp:  [98.5 F (36.9 C)] 98.5 F (36.9 C) (05/21 1032) Pulse Rate:  [62-75] 69 (05/21 1400) Resp:  [13-20] 13 (05/21 1400) BP: (107-137)/(57-81) 107/57 (05/21 1400) SpO2:  [91 %-97 %] 91 % (05/21 1400)  GENERAL: Well nourished, Well developed elderly male , cooperative, pleasant and NAD HEENT: - Normocephalic and atraumatic, dry mm, no LN++, no Thyromegally LUNGS - Clear to auscultation bilaterally with no wheezes CV - S1S2 RRR,  no m/r/g, equal pulses bilaterally. ABDOMEN - Soft, nontender, nondistended with normoactive BS Ext: warm, well perfused, intact peripheral pulses, no edema  NEURO:  Mental Status: AA&Ox3 , Follows Commands and recognizes reasoning for his admission  Language: speech is Normal,  Naming, repetition, fluency, and comprehension intact. Cranial Nerves: PERR 34m/brisk. EOMI, visual fields full, no facial asymmetry, facial sensation intact, hearing intact, tongue/uvula/soft palate midline, normal, sternocleidomastoid and trapezius muscle strength. No evidence of tongue atrophy or fasciculations Motor: No Drift, No TREMORS            LUE 5/5  LLE 5/5   Left Grip 5/5            RUE 5/5  RLE 5/5  Right Grip 5-/5 Tone: is normal and bulk is normal Sensation- Intact to light touch bilaterally Coordination: FTN intact bilaterally, no ataxia in BLE. Gait- deferred  NIHSS: 0   Labs I have reviewed labs in epic and the results pertinent to this consultation are:   CBC    Component Value Date/Time   WBC 5.2 09/11/2021 1040   RBC 3.34 (L) 09/11/2021 1040   HGB 11.3 (L) 09/11/2021 1040   HGB 11.0 (L) 10/10/2018 1404   HGB 11.0 (L) 06/14/2018 1222   HCT 33.8 (L) 09/11/2021 1040   HCT 33.9 (L) 06/14/2018 1222   PLT 143 (L) 09/11/2021 1040   PLT 176 10/10/2018 1404   PLT 214 06/14/2018 1222   MCV 101.2 (H) 09/11/2021 1040   MCV 92 06/14/2018 1222   MCH 33.8 09/11/2021 1040   MCHC 33.4 09/11/2021 1040   RDW 13.5 09/11/2021 1040   RDW 14.7 06/14/2018 1222   LYMPHSABS 1.0 09/11/2021 1040   MONOABS 0.6 09/11/2021 1040   EOSABS 0.3 09/11/2021 1040   BASOSABS 0.0 09/11/2021 1040    CMP     Component Value Date/Time   NA 139 09/11/2021 1040   NA 143 07/01/2018 1233   K 3.7 09/11/2021 1040   CL 103 09/11/2021 1040   CO2 31 09/11/2021 1040   GLUCOSE 140 (H) 09/11/2021 1040   BUN 20 09/11/2021 1040   BUN 27 07/01/2018 1233   CREATININE 2.32 (H) 09/11/2021 1040   CREATININE 2.06 (H)  10/10/2018 1404   CALCIUM 9.1 09/11/2021 1040   PROT 7.0 09/11/2021 1040   ALBUMIN 3.2 (L) 09/11/2021 1040   AST 18 09/11/2021 1040   AST 20 10/10/2018 1404  ALT 15 09/11/2021 1040   ALT 16 10/10/2018 1404   ALKPHOS 54 09/11/2021 1040   BILITOT 0.7 09/11/2021 1040   BILITOT 0.4 10/10/2018 1404   GFRNONAA 27 (L) 09/11/2021 1040   GFRNONAA 29 (L) 10/10/2018 1404   GFRAA 26 (L) 12/02/2019 1108   GFRAA 34 (L) 10/10/2018 1404    Lipid Panel     Component Value Date/Time   CHOL 147 06/23/2021 1109   TRIG 158 (H) 06/23/2021 1109   HDL 37 (L) 06/23/2021 1109   CHOLHDL 4.0 06/23/2021 1109   VLDL 32 06/23/2021 1109   LDLCALC 78 06/23/2021 1109     Imaging I have reviewed the images obtained: 05/21//23   MRI examination of the brain w/o Contrast: 09/11/21 1. Multiple acute to subacute bilateral cerebellar and AICA territory infarcts. Superimposed mild distal right PCA and possible also left thalamostriate artery ischemia. No associated hemorrhage or mass effect.  2. Poor flow or occlusion of the distal right vertebral artery in the upper neck and at the skull base. CTA head and neck would best evaluate further.  3. Chronic lacunar infarction in the left pons.   Echocardiogram (07/05/21): 1. Left ventricular ejection fraction, by estimation, is 30 to 35%. The  left ventricle has moderately decreased function. The left ventricle  demonstrates global hypokinesis. Left ventricular diastolic parameters are  consistent with Grade I diastolic  dysfunction (impaired relaxation).   2. Right ventricular systolic function is normal. The right ventricular  size is normal. There is normal pulmonary artery systolic pressure.   3. Left atrial size was mildly dilated.   4. Right atrial size was moderately dilated.   5. The mitral valve is grossly normal. Mild mitral valve regurgitation.  No evidence of mitral stenosis.   6. The aortic valve is tricuspid. There is mild calcification of the   aortic valve. Aortic valve regurgitation is mild. Aortic valve sclerosis  is present, with no evidence of aortic valve stenosis.   7. Aortic dilatation noted. There is mild dilatation of the ascending  aorta, measuring 44 mm.   8. The inferior vena cava is normal in size with greater than 50%  respiratory variability, suggesting right atrial pressure of 3 mmHg.   Assessment: 86 year old male with multiple small bilateral subacute cerebellar ischemic infarctions on MRI, which was obtained for several weeks of intermittent dizziness, nausea, vomiting and aphasic spells.  - Exam is nonfocal. No ataxia appreciated - Stroke risk factors: DM, HTN, HLD, CKD, CHF and CAD - Most concerning in terms of risk factors is his systolic CHF and depressed LVEF of 30-35% which is concerning for a cardio-embolic event  Recommendations: -Neuro Checks q 4 hours -Dysphagia Screening -Antiplatelet Therapy: ASA 81 mg po chewable qday -Statin Therapy: Start Lipitor 20 mg po qhs  -Check Lipid Profile  LDL Goal < 70 -Check Hgb A1c        A1c  Goal < 6.0% -Check TSH -TTE -MRA Head w/o Contrast (Ordered) -Carotid Ultrasound Bilateral (Ordered)  -PT/ OT Consultation -SLT Consultation  -Telemetry to assess for possible paroxysmal Atrial Fibrillation  -- Elenora Gamma , PA-C Neurohospitalist [employer] [facility]  I have seen and examined the patient. I have formulated the assessment and recommendations. 86 year old male with multiple small bilateral subacute cerebellar ischemic infarctions on MRI, which was obtained for several weeks of intermittent dizziness, nausea, vomiting and aphasic spells. Exam is nonfocal. No ataxia appreciated. Recommendations as above.  Electronically signed: Dr. Kerney Elbe

## 2021-09-11 NOTE — ED Triage Notes (Signed)
Pt sent from [facility] for + stroke on MRI.  Reports dizziness and blurred vision x 2 weeks with intermittent R sided facial numbness.  No arm drift.

## 2021-09-11 NOTE — ED Notes (Signed)
Patient transported to MRI 

## 2021-09-11 NOTE — Progress Notes (Signed)
Placed patient on CPAP via auto-mode foe the night

## 2021-09-11 NOTE — ED Provider Notes (Addendum)
[facility] EMERGENCY DEPARTMENT Provider Note   CSN: [id number] Arrival date & time: 09/11/21  1026     History  Chief Complaint  Patient presents with   Cerebrovascular Accident    Allen Dyer is a 86 y.o. male.  HPI 86 year old male presents today with acute to subacute cerebellar infarct.  Patient was seen by his primary care physician on Thursday.  He began having worsening symptoms of dizziness, vomiting, and difficulty speaking Thursday night.  His symptoms have improved.  His primary care physician ordered an outpatient MRI that was obtained today. MRI revealed multiple acute to subacute bilateral cerebellar and AICA territory infarcts with superimposed mild distal right PCA and possible also left thalami striate artery ischemia no associated hemorrhage or mass effect I received a call from Dr. Nevada Crane Patient was told to come to the ED and is here in the ED for evaluation    Home Medications Prior to Admission medications   Medication Sig Start Date End Date Taking? Authorizing Provider  allopurinol (ZYLOPRIM) 100 MG tablet Take 50 mg by mouth daily. 10/11/20   [provider]  Ascorbic Acid (VITAMIN C PO) Take 4,000 mg by mouth daily in the afternoon.    [provider]  Blood Glucose Monitoring Suppl (FREESTYLE LITE) w/Device KIT 1 Device by Does not apply route daily in the afternoon. 05/24/21   Shamleffer, Melanie Crazier, MD  carvedilol (COREG) 6.25 MG tablet Take 1.5 tablets (9.375 mg total) by mouth 2 (two) times daily with a meal. 08/10/21   Milford, Maricela Bo, FNP  Cholecalciferol (VITAMIN D3 PO) Take 1 tablet by mouth daily.     [provider]  cyanocobalamin 100 MCG tablet Take 100 mcg by mouth daily.     [provider]  eplerenone (INSPRA) 50 MG tablet Take 1 tablet (50 mg total) by mouth daily. 06/23/21   Larey Dresser, MD  gabapentin (NEURONTIN) 600 MG tablet Take 300 mg by mouth 2 (two) times daily. 04/28/20    [provider]  Garlic 6384 MG CAPS Take 1,000 mg by mouth daily.    [provider]  glipiZIDE (GLUCOTROL) 5 MG tablet Take 1 tablet (5 mg total) by mouth daily before breakfast AND 0.5 tablets (2.5 mg total) daily before supper. 03/30/21   Shamleffer, Melanie Crazier, MD  glucose blood (FREESTYLE LITE) test strip 1 each by Other route daily in the afternoon. Use as instructed 05/24/21   Shamleffer, Melanie Crazier, MD  hydrALAZINE (APRESOLINE) 50 MG tablet TAKE 1 TABLET(50 MG) BY MOUTH THREE TIMES DAILY. PLEASE MAKE AN APPOINTMENT FOR FURTHER REFILLS 07/25/21   Larey Dresser, MD  isosorbide mononitrate (IMDUR) 30 MG 24 hr tablet Take 30 mg by mouth daily. 08/05/19   [provider]  Multiple Vitamin (MULTIVITAMIN WITH MINERALS) TABS tablet Take 1 tablet by mouth 2 (two) times daily.    [provider]  potassium chloride SA (KLOR-CON M) 20 MEQ tablet Take 1 tablet (20 mEq total) by mouth daily. 08/11/21   Milford, Maricela Bo, FNP  Semaglutide (RYBELSUS) 14 MG TABS Take 14 mg by mouth daily. 03/30/21   Shamleffer, Melanie Crazier, MD  tamsulosin (FLOMAX) 0.4 MG CAPS capsule Take 0.4 mg by mouth 2 (two) times daily.  03/24/17   [provider]  torsemide (DEMADEX) 20 MG tablet Take 2 tablets (40 mg total) by mouth 2 (two) times daily. 06/23/21   Larey Dresser, MD  triamcinolone ointment (KENALOG) 0.1 %  03/02/20  [provider]      Allergies    Tramadol, Finasteride, Lisinopril, Ciprofloxacin, Simvastatin, and Sulfa antibiotics    Review of Systems   Review of Systems  All other systems reviewed and are negative.  Physical Exam Updated Vital Signs BP 130/74   Pulse 67   Temp 98.5 F (36.9 C) (Oral)   Resp 15   SpO2 97%  Physical Exam Vitals and nursing note reviewed.  Constitutional:      General: He is not in acute distress.    Appearance: Normal appearance.  HENT:     Head: Normocephalic.     Right Ear: External ear normal.      Left Ear: External ear normal.     Nose: Nose normal.     Mouth/Throat:     Pharynx: Oropharynx is clear.  Eyes:     Extraocular Movements: Extraocular movements intact.     Pupils: Pupils are equal, round, and reactive to light.  Cardiovascular:     Rate and Rhythm: Normal rate and regular rhythm.     Pulses: Normal pulses.  Pulmonary:     Effort: Pulmonary effort is normal.     Breath sounds: Normal breath sounds.  Abdominal:     General: Abdomen is flat.     Palpations: Abdomen is soft.  Musculoskeletal:        General: Normal range of motion.     Cervical back: Normal range of motion.  Skin:    General: Skin is warm and dry.     Capillary Refill: Capillary refill takes less than 2 seconds.  Neurological:     General: No focal deficit present.     Mental Status: He is alert and oriented to person, place, and time.     Cranial Nerves: No cranial nerve deficit.     Sensory: No sensory deficit.     Motor: No weakness.     Coordination: Coordination normal.     Gait: Gait normal.     Deep Tendon Reflexes: Reflexes normal.  Psychiatric:        Mood and Affect: Mood normal.    ED Results / Procedures / Treatments   Labs (all labs ordered are listed, but only abnormal results are displayed) Labs Reviewed  CBC - Abnormal; Notable for the following components:      Result Value   RBC 3.34 (*)    Hemoglobin 11.3 (*)    HCT 33.8 (*)    MCV 101.2 (*)    Platelets 143 (*)    All other components within normal limits  COMPREHENSIVE METABOLIC PANEL - Abnormal; Notable for the following components:   Glucose, Bld 140 (*)    Creatinine, Ser 2.32 (*)    Albumin 3.2 (*)    GFR, Estimated 27 (*)    All other components within normal limits  PROTIME-INR  APTT  DIFFERENTIAL    EKG None  Radiology MR BRAIN WO CONTRAST  Result Date: 09/11/2021 CLINICAL DATA:  86 year old male with recent dizziness, facial numbness. EXAM: MRI HEAD WITHOUT CONTRAST TECHNIQUE: Multiplanar,  multiecho pulse sequences of the brain and surrounding structures were obtained without intravenous contrast. COMPARISON:  Head CT 08/04/2021. FINDINGS: Brain: Multifocal bilateral cerebellar hemisphere restricted diffusion with T2 and FLAIR hyperintensity compatible with multiple acute to subacute mostly AICA territory infarcts. No associated hemorrhage or mass effect. Additional more subtle restricted diffusion in the right occipital pole series 3, image 79, also with mild T2 and FLAIR hyperintensity. And questionable superimposed abnormal diffusion  in the superior left thalamus, punctate on series 3, image 84. No other restricted diffusion. No midline shift, mass effect, evidence of mass lesion, ventriculomegaly, extra-axial collection or acute intracranial hemorrhage. Cervicomedullary junction and pituitary are within normal limits. Background gray and white matter signal is within normal limits for age. No chronic cortical encephalomalacia or chronic cerebral blood products identified. Outside of the left thalamus the deep gray matter nuclei appear normal. There is chronic lacunar infarction in the left paracentral pons (series 11, image 48). Vascular: Abnormal distal right vertebral artery flow void in the upper neck and at the skull base (series 7, images 1-4. Other Major intracranial vascular flow voids are preserved. Skull and upper cervical spine: Negative. Visualized bone marrow signal is within normal limits. Sinuses/Orbits: Postoperative changes to both globes. Minor paranasal sinus mucosal thickening. Other: Visible internal auditory structures appear normal. Trace mastoid air cell fluid. Negative visible scalp and face. IMPRESSION: 1. Multiple acute to subacute bilateral cerebellar and AICA territory infarcts. Superimposed mild distal right PCA and possible also left thalamostriate artery ischemia. No associated hemorrhage or mass effect. 2. Poor flow or occlusion of the distal right vertebral artery  in the upper neck and at the skull base. CTA head and neck would best evaluate further. 3. Chronic lacunar infarction in the left pons. #1 and #2 were discussed with the patient's daughter at 1016 hours. And I advised transfer to [facility] Emergency Department for further Neurologic evaluation and treatment. She understood and was agreeable. Electronically Signed   By: Genevie Ann M.D.   On: 09/11/2021 10:30    Procedures .Critical Care Performed by: Pattricia Boss, MD Authorized by: Pattricia Boss, MD   Critical care provider statement:    Critical care time (minutes):  30   Critical care was necessary to treat or prevent imminent or life-threatening deterioration of the following conditions:  CNS failure or compromise   Critical care was time spent personally by me on the following activities:  Development of treatment plan with patient or surrogate, discussions with consultants, evaluation of patient's response to treatment, examination of patient, ordering and review of laboratory studies, ordering and review of radiographic studies, ordering and performing treatments and interventions, pulse oximetry, re-evaluation of patient's condition and review of old charts    Medications Ordered in ED Medications  sodium chloride flush (NS) 0.9 % injection 3 mL (has no administration in time range)    ED Course/ Medical Decision Making/ A&P Clinical Course as of 09/11/21 1142  Sun Sep 11, 2021  1109 [facility] reviewed and interpreted with mild anemia which appears stable from prior [DR]  1109 Mild thrombocytopenia noted with but platelets of 143,000 with first prior 170 [DR]    Clinical Course User Index [DR] Pattricia Boss, MD                           Medical Decision Making 86 year old male presents today with dizziness described as room spinning, episodes of aphasia, these symptoms began 3 days prior to evaluation.  Patient had MRI performed and has multiple cerebellar and AICA territory  infarcts However, patient has a normal neurological exam here today. Patient has been having intermittent symptoms over the past 4 to 6 weeks.  He was seen here in the ED at that time was felt to be lightheaded and not have any focal neurological deficits.  Patient was seen outpatient by ENT and had Dix-Hallpike maneuver performed.  He felt like  his symptoms were somewhat improved at that time.  Patient with acute onset of current symptoms on Thursday He is not on any blood thinners Blood pressure and heart rate are normal with a normal sinus rhythm Plan admission for further evaluation of stroke etiology, evaluation, treatment, and physical therapy.  Amount and/or Complexity of Data Reviewed External Data Reviewed: notes.    Details: Reviewed notes from ENT Labs: ordered. Decision-making details documented in ED Course. Radiology: ordered and independent interpretation performed. Decision-making details documented in ED Course.    Details: Reviewed outpatient MRI ECG/medicine tests: ordered and independent interpretation performed. Decision-making details documented in ED Course.    Details: Reviewed EKG Discussion of management or test interpretation with external provider(s): Discussed with Dr. Lorin Mercy who will see for hospitalist admission Discussed with Dr. Cheral Marker, on-call for neurology who will see in consultation  Risk Decision regarding hospitalization.           Final Clinical Impression(s) / ED Diagnoses Final diagnoses:  Cerebrovascular accident (CVA), unspecified mechanism [facility])    Rx / [place] Orders ED Discharge Orders     None         Pattricia Boss, MD 09/11/21 1142    Pattricia Boss, MD 09/11/21 1229

## 2021-09-11 NOTE — H&P (Signed)
History and Physical    Patient: Allen Dyer OIZ:[id number] DOB: 28-Aug-1935 DOA: 09/11/2021 DOS: the patient was seen and examined on 09/11/2021 PCP: [name], MD  Patient coming from: Home - lives with ; NOK: daughter, her husband, and 3 grandchildren; NOK: Daughter, Verdene Lennert, [phone]   Chief Complaint: CVA  HPI: Allen Dyer is a 86 y.o. male with medical history significant of chronic combined CHF; stage 4 CKD; HTN; HLD; multiple myeloma; prostate CA s/p XRT; and DM presenting with a CVA.   He was seen at his PCP office on 5/18 for expressive aphasia and arranged for an outpatient MRI.  MRI was done today and confirms multiple acute to subacute B cerebellar and AICA territory infarcts with concern for R cerebral artery occlusion.  He reports that he started getting a little dizzy about 2 months ago at church.  He thought it was related to medication and he started feeling better but it recurred another week or so later.  He came to the ER and they did a CT and discharged.  He was sent to his PCP and was diagnosed with vertigo.  He went to ENT and had a Dix-Halpike with some improvement.  He started getting dizzy again with diplopia.  He had a pounding in his left ear and thought there was fluid in his right ear.  Symptoms were continuing and getting a little worse and he saw Dr. Lindell Noe.  He told her he thought he had mini-strokes and she sent him for an MRI.  MRI today confirmed strokes and radiology asked them to come to the ER.  He feels "great" now.  Dizziness is usually with standing and moving around.  He also has some dizziness at rest periodically.  No current visual changes.  No dysphagia.  Thursday night he had extreme dizziness with nausea and vomiting and felt better after emesis.  He was trying to speak but could tell he was unintelligible.  His daughter asked questions and she could not understand his responses.  It cleared after seconds.  He was back to baseline the  following day.  He has had bouts of dizziness every day.  He has a pinched nerve in his neck and he gets numbness and tingling of hands chronically.    ER Course:  Dizziness 4 weeks ago with intermittent visual changes.  Saw ENT and then PCP.  Outpatient MRI ordered.  Developed acute vertigo, difficulty speaking.  MRI today with acute/subacute cerebellar CVA.  Normal neuro exam currently.  No apparent afib, not on AC.  Neurology consulted.     Review of Systems: As mentioned in the history of present illness. All other systems reviewed and are negative. Past Medical History:  Diagnosis Date   Chronic combined systolic and diastolic CHF (congestive heart failure) (HCC)    CKD (chronic kidney disease), stage III (HCC)    Hyperlipidemia    Hypertension    Lower extremity edema    Mild CAD    a. mild-mod by cath 05/2018.   Multiple myeloma ([place]) 11/15/2018   NICM (nonischemic cardiomyopathy) ([place])    Peripheral neuropathy    Prostate cancer ([place])    Status post XRT   Rheumatic fever    Trifascicular block    Type 2 diabetes mellitus [facility])    Past Surgical History:  Procedure Laterality Date   RIGHT HEART CATH N/A 07/10/2018   Procedure: RIGHT HEART CATH;  Surgeon: Larey Dresser, MD;  Location: [facility];  Service: Cardiovascular;  Laterality: N/A;   RIGHT/LEFT HEART CATH AND CORONARY ANGIOGRAPHY N/A 06/18/2018   Procedure: RIGHT/LEFT HEART CATH AND CORONARY ANGIOGRAPHY;  Surgeon: Troy Sine, MD;  Location: [facility];  Service: Cardiovascular;  Laterality: N/A;   Social History:  reports that he quit smoking about 50 years ago. His smoking use included cigarettes. He has a 20.00 pack-year smoking history. He has never used smokeless tobacco. He reports that he does not currently use alcohol. He reports that he does not use drugs.  Allergies  Allergen Reactions   Tramadol Other (See Comments)    Dizziness (intolerance) hallucinate   Finasteride Swelling     Breast enlargement    Lisinopril Cough   Ciprofloxacin Other (See Comments)    Dizziness (intolerance)    Simvastatin Hives and Other (See Comments)    Blisters/ Rash    Sulfa Antibiotics Rash    Family History  Problem Relation Age of Onset   Cancer Mother    Congestive Heart Failure Father        died from heart failure at the age of 28   Cancer Sister    Congestive Heart Failure Brother        died from heart failure at the age of 16   Stroke Neg Hx     Prior to Admission medications   Medication Sig Start Date End Date Taking? Authorizing Provider  allopurinol (ZYLOPRIM) 100 MG tablet Take 50 mg by mouth daily. 10/11/20   [provider]  Ascorbic Acid (VITAMIN C PO) Take 4,000 mg by mouth daily in the afternoon.    [provider]  Blood Glucose Monitoring Suppl (FREESTYLE LITE) w/Device KIT 1 Device by Does not apply route daily in the afternoon. 05/24/21   Shamleffer, Melanie Crazier, MD  carvedilol (COREG) 6.25 MG tablet Take 1.5 tablets (9.375 mg total) by mouth 2 (two) times daily with a meal. 08/10/21   Milford, Maricela Bo, FNP  Cholecalciferol (VITAMIN D3 PO) Take 1 tablet by mouth daily.     [provider]  cyanocobalamin 100 MCG tablet Take 100 mcg by mouth daily.     [provider]  eplerenone (INSPRA) 50 MG tablet Take 1 tablet (50 mg total) by mouth daily. 06/23/21   Larey Dresser, MD  gabapentin (NEURONTIN) 600 MG tablet Take 300 mg by mouth 2 (two) times daily. 04/28/20   [provider]  Garlic 0923 MG CAPS Take 1,000 mg by mouth daily.    [provider]  glipiZIDE (GLUCOTROL) 5 MG tablet Take 1 tablet (5 mg total) by mouth daily before breakfast AND 0.5 tablets (2.5 mg total) daily before supper. 03/30/21   Shamleffer, Melanie Crazier, MD  glucose blood (FREESTYLE LITE) test strip 1 each by Other route daily in the afternoon. Use as instructed 05/24/21   Shamleffer, Melanie Crazier, MD  hydrALAZINE (APRESOLINE)  50 MG tablet TAKE 1 TABLET(50 MG) BY MOUTH THREE TIMES DAILY. PLEASE MAKE AN APPOINTMENT FOR FURTHER REFILLS 07/25/21   Larey Dresser, MD  isosorbide mononitrate (IMDUR) 30 MG 24 hr tablet Take 30 mg by mouth daily. 08/05/19   [provider]  Multiple Vitamin (MULTIVITAMIN WITH MINERALS) TABS tablet Take 1 tablet by mouth 2 (two) times daily.    [provider]  potassium chloride SA (KLOR-CON M) 20 MEQ tablet Take 1 tablet (20 mEq total) by mouth daily. 08/11/21   Milford, Maricela Bo, FNP  Semaglutide (RYBELSUS) 14 MG TABS Take 14 mg by mouth daily.  03/30/21   Shamleffer, Melanie Crazier, MD  tamsulosin (FLOMAX) 0.4 MG CAPS capsule Take 0.4 mg by mouth 2 (two) times daily.  03/24/17   [provider]  torsemide (DEMADEX) 20 MG tablet Take 2 tablets (40 mg total) by mouth 2 (two) times daily. 06/23/21   Larey Dresser, MD  triamcinolone ointment (KENALOG) 0.1 %  03/02/20   [provider]    Physical Exam: Vitals:   09/11/21 1300 09/11/21 1400 09/11/21 1630 09/11/21 1638  BP: 137/78 (!) 107/57 137/80   Pulse: 62 69 68   Resp: 20 13 20    Temp:    97.7 F (36.5 C)  TempSrc:    Oral  SpO2: 95% 91% 95%    General:  Appears calm and comfortable and is in NAD Eyes:  PERRL, EOMI, normal lids, iris ENT:  grossly normal hearing, lips & tongue, mmm; some absent dentition Neck:  no LAD, masses or thyromegaly Cardiovascular:  RRR, no m/r/g. 2+ chronic LE edema.  Respiratory:   CTA bilaterally with no wheezes/rales/rhonchi.  Normal respiratory effort. Abdomen:  soft, NT, ND Skin:  no rash or induration seen on limited exam Musculoskeletal:  grossly normal tone BUE/BLE, good ROM, no bony abnormality Lower extremity:  2+ chronic LE edema.  Limited foot exam with no ulcerations.  2+ distal pulses. Psychiatric:  grossly normal mood and affect, speech fluent and appropriate, AOx3 Neurologic:  CN 2-12 grossly intact, moves all extremities in coordinated  fashion   Radiological Exams on Admission: Independently reviewed - see discussion in A/P where applicable  MR ANGIO HEAD WO CONTRAST  Result Date: 09/11/2021 CLINICAL DATA:  Stroke, follow up EXAM: MRA HEAD WITHOUT CONTRAST TECHNIQUE: Angiographic images of the Circle of Willis were acquired using MRA technique without intravenous contrast. COMPARISON:  None Available. FINDINGS: Anterior circulation: Intracranial internal carotid arteries are patent with atherosclerotic irregularity. There is up to moderate stenosis of the cavernous portions. Moderate stenosis of the left supraclinoid portion. Anterior cerebral arteries are patent. 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 Middle cerebral arteries are patent. Posterior circulation: Intracranial left vertebral artery is patent. Severe stenosis of the distal left vertebral artery. There is flow related enhancement only of the distal right intracranial vertebral artery, which may reflect retrograde flow. Basilar artery is patent with atherosclerotic irregularity and up to moderate stenosis. Patent left PICA and AICA identified. Superior cerebellar artery origins are patent. Posterior cerebral arteries are patent. There is diffuse narrowing of the left P2 PCA. IMPRESSION: Intracranial right vertebral artery shows minimal flow only distally, which is probably retrograde. High-grade stenosis of the distal left vertebral artery. Atherosclerosis of the basilar with up to moderate stenosis. Bilateral intracranial ICA atherosclerosis with up to moderate stenosis. Electronically Signed   By: Macy Mis M.D.   On: 09/11/2021 15:34   MR BRAIN WO CONTRAST  Result Date: 09/11/2021 CLINICAL DATA:  86 year old male with recent dizziness, facial numbness. EXAM: MRI HEAD WITHOUT CONTRAST TECHNIQUE: Multiplanar, multiecho pulse sequences of the brain and surrounding structures were obtained without intravenous contrast. COMPARISON:  Head CT 08/04/2021. FINDINGS:  Brain: Multifocal bilateral cerebellar hemisphere restricted diffusion with T2 and FLAIR hyperintensity compatible with multiple acute to subacute mostly AICA territory infarcts. No associated hemorrhage or mass effect. Additional more subtle restricted diffusion in the right occipital pole series 3, image 79, also with mild T2 and FLAIR hyperintensity. And questionable superimposed abnormal diffusion in the superior left thalamus, punctate on series 3, image 84. No other restricted diffusion. No  midline shift, mass effect, evidence of mass lesion, ventriculomegaly, extra-axial collection or acute intracranial hemorrhage. Cervicomedullary junction and pituitary are within normal limits. Background gray and white matter signal is within normal limits for age. No chronic cortical encephalomalacia or chronic cerebral blood products identified. Outside of the left thalamus the deep gray matter nuclei appear normal. There is chronic lacunar infarction in the left paracentral pons (series 11, image 48). Vascular: Abnormal distal right vertebral artery flow void in the upper neck and at the skull base (series 7, images 1-4. Other Major intracranial vascular flow voids are preserved. Skull and upper cervical spine: Negative. Visualized bone marrow signal is within normal limits. Sinuses/Orbits: Postoperative changes to both globes. Minor paranasal sinus mucosal thickening. Other: Visible internal auditory structures appear normal. Trace mastoid air cell fluid. Negative visible scalp and face. IMPRESSION: 1. Multiple acute to subacute bilateral cerebellar and AICA territory infarcts. Superimposed mild distal right PCA and possible also left thalamostriate artery ischemia. No associated hemorrhage or mass effect. 2. Poor flow or occlusion of the distal right vertebral artery in the upper neck and at the skull base. CTA head and neck would best evaluate further. 3. Chronic lacunar infarction in the left pons. #1 and #2 were  discussed with the patient's daughter at 1016 hours. And I advised transfer to [facility] Emergency Department for further Neurologic evaluation and treatment. She understood and was agreeable. Electronically Signed   By: Genevie Ann M.D.   On: 09/11/2021 10:30    EKG: Independently reviewed.  NSR with rate 65; bifascicular block without obvious change from prior   Labs on Admission: I have personally reviewed the available labs and imaging studies at the time of the admission.  Pertinent labs:    Glucose 140 BUN 20/Creatinine 2.32/GFR 27 - stable WBC 5.2 Hgb 11.3 - stable Platelets 143, prior 179   Assessment and Plan: Principal Problem:   Acute CVA (cerebrovascular accident) ([place]) Active Problems:   Chronic kidney disease (CKD), stage IV (severe) (HCC)   Hyperlipidemia   Chronic combined systolic (congestive) and diastolic (congestive) heart failure (HCC)   Essential hypertension   Type 2 diabetes mellitus with vascular disease (HCC)   Smoldering multiple myeloma   DNR (do not resuscitate)    CVA -Patient with dizziness for over a month -Clearly worse symptoms a few days ago, but timing is uncertain -Regardless, outpatient MRI was done and shows cerebellar infarcts/emboli -Deficits currently appear to be resolved but are still apparently waxing and waning -Aspirin has been given to reduce stroke mortality and decrease morbidity; he stopped taking 81 mg ASA daily about 6 months ago due to GI upset -Will admit for further CVA evaluation -Telemetry monitoring -MRA vs. CTA head and neck per neurology; CTA is likely preferred to evaluate possible occlusion in posterior circulation but patient has advanced CKD and would like to protect his kidneys if possible -Carotid dopplers; if ipsilateral carotid stenosis is detected then prompt vascular surgery consultation is needed for consideration of CEA. -Echo -Risk stratification with FLP, A1c; will also check UDS -Consider  thrombectomy if there is persistent disabling neurologic deficit associated with a vascular cut-off -Neurology consult -PT/OT/ST/Nutrition Consults  HTN -Allow permissive HTN for now -Treat BP only if >220/120, and then with goal of 15% reduction -Hold Coreg, eplerenone, hydralazine  HLD -Check FLP -Resume statin but will change Pravachol to Lipitor 40 mg daily   DM -Check A1c -Hold home PO medications (glipizide) -Will order moderate-scale SSI -Continue Neurontin  Combined  CHF -Continue Imdur -Echo in 3/23 with EF 30-35% and grade 1 DD -Followed by Dr. Aundra Dubin  Stage 4 CKD  -Appears to be stable -Will trend  Multiple myeloma -Followed by Dr. Alvy Bimler, last seen in 06/2021 -Appears to have smoldering disease without progression -He is due to f/u in about 2 months  Obesity -Hold semaglutide  DNR -I have discussed code status with the patient and his daughter and  they are in agreement that the patient would not desire resuscitation and would prefer to die a natural death should that situation arise. -He will need a gold out of facility DNR form at the time of discharge       Advance Care Planning:   Code Status: DNR   Consults: Neurology; PT/OT/ST/Nutrition; [facility] team  DVT Prophylaxis: Lovenox  Family Communication: Daughter was present throughout evaluation  Severity of Illness: The appropriate patient status for this patient is INPATIENT. Inpatient status is judged to be reasonable and necessary in order to provide the required intensity of service to ensure the patient's safety. The patient's presenting symptoms, physical exam findings, and initial radiographic and laboratory data in the context of their chronic comorbidities is felt to place them at high risk for further clinical deterioration. Furthermore, it is not anticipated that the patient will be medically stable for discharge from the hospital within 2 midnights of admission.   * I certify that at the  point of admission it is my clinical judgment that the patient will require inpatient hospital care spanning beyond 2 midnights from the point of admission due to high intensity of service, high risk for further deterioration and high frequency of surveillance required.*  Author: Karmen Bongo, MD 09/11/2021 5:07 PM  For on call review [email or web address].

## 2021-09-12 ENCOUNTER — Inpatient Hospital Stay (HOSPITAL_COMMUNITY): Payer: Medicare Other

## 2021-09-12 DIAGNOSIS — I639 Cerebral infarction, unspecified: Secondary | ICD-10-CM | POA: Diagnosis not present

## 2021-09-12 DIAGNOSIS — I6389 Other cerebral infarction: Secondary | ICD-10-CM

## 2021-09-12 DIAGNOSIS — E785 Hyperlipidemia, unspecified: Secondary | ICD-10-CM

## 2021-09-12 LAB — GLUCOSE, CAPILLARY
Glucose-Capillary: 202 mg/dL — ABNORMAL HIGH (ref 70–99)
Glucose-Capillary: 61 mg/dL — ABNORMAL LOW (ref 70–99)
Glucose-Capillary: 84 mg/dL (ref 70–99)
Glucose-Capillary: 91 mg/dL (ref 70–99)
Glucose-Capillary: 102 mg/dL — ABNORMAL HIGH (ref 70–99)

## 2021-09-12 LAB — BASIC METABOLIC PANEL
Anion gap: 6 (ref 5–15)
BUN: 25 mg/dL — ABNORMAL HIGH (ref 8–23)
CO2: 31 mmol/L (ref 22–32)
Calcium: 9.3 mg/dL (ref 8.9–10.3)
Chloride: 104 mmol/L (ref 98–111)
Creatinine, Ser: 2.45 mg/dL — ABNORMAL HIGH (ref 0.61–1.24)
GFR, Estimated: 25 mL/min — ABNORMAL LOW (ref >60)
Glucose, Bld: 110 mg/dL — ABNORMAL HIGH (ref 70–99)
Potassium: 3.7 mmol/L (ref 3.5–5.1)
Sodium: 141 mmol/L (ref 135–145)

## 2021-09-12 LAB — ECHOCARDIOGRAM COMPLETE
AR max vel: 2.91 cm2
AV Area VTI: 2.82 cm2
AV Area mean vel: 2.89 cm2
AV Mean grad: 3 mmHg
AV Peak grad: 5.6 mmHg
Ao pk vel: 1.18 m/s
Area-P 1/2: 3.53 cm2
Calc EF: 35.6 %
Height: 67.5 in
MV VTI: 3.3 cm2
S' Lateral: 4.8 cm
Single Plane A2C EF: 39.2 %
Single Plane A4C EF: 33.5 %
Weight: 3093.49 oz

## 2021-09-12 LAB — LIPID PANEL
Cholesterol: 142 mg/dL (ref 0–200)
HDL: 35 mg/dL — ABNORMAL LOW (ref >40)
LDL Cholesterol: 79 mg/dL (ref 0–99)
Total CHOL/HDL Ratio: 4.1 RATIO
Triglycerides: 139 mg/dL (ref <150)
VLDL: 28 mg/dL (ref 0–40)

## 2021-09-12 LAB — HEMOGLOBIN A1C
Hgb A1c MFr Bld: 6.7 % — ABNORMAL HIGH (ref 4.8–5.6)
Mean Plasma Glucose: 145.59 mg/dL

## 2021-09-12 MED ORDER — EZETIMIBE 10 MG PO TABS
10.0000 mg | ORAL_TABLET | Freq: Every day | ORAL | Status: DC
Start: 2021-09-12 — End: 2021-09-13
  Administered 2021-09-12 – 2021-09-13 (×2): 10 mg via ORAL
  Filled 2021-09-12 (×2): qty 1

## 2021-09-12 MED ORDER — VITAMIN B-12 1000 MCG PO TABS
500.0000 ug | ORAL_TABLET | Freq: Every morning | ORAL | Status: DC
  Administered 2021-09-12 – 2021-09-13 (×2): 500 ug via ORAL
  Filled 2021-09-12 (×2): qty 1

## 2021-09-12 MED ORDER — PANTOPRAZOLE SODIUM 40 MG PO TBEC
40.0000 mg | DELAYED_RELEASE_TABLET | Freq: Every day | ORAL | Status: DC
  Administered 2021-09-12 – 2021-09-13 (×2): 40 mg via ORAL
  Filled 2021-09-12 (×2): qty 1

## 2021-09-12 MED ORDER — CLOPIDOGREL BISULFATE 75 MG PO TABS
75.0000 mg | ORAL_TABLET | Freq: Every day | ORAL | Status: DC
Start: 2021-09-12 — End: 2021-09-13
  Administered 2021-09-12 – 2021-09-13 (×2): 75 mg via ORAL
  Filled 2021-09-12 (×2): qty 1

## 2021-09-12 MED ORDER — RENA-VITE PO TABS
1.0000 | ORAL_TABLET | Freq: Every day | ORAL | Status: DC
  Administered 2021-09-12: 1 via ORAL
  Filled 2021-09-12: qty 1

## 2021-09-12 MED ORDER — ASPIRIN 81 MG PO CHEW
81.0000 mg | CHEWABLE_TABLET | Freq: Every day | ORAL | Status: DC
  Administered 2021-09-13: 81 mg via ORAL
  Filled 2021-09-12: qty 1

## 2021-09-12 NOTE — Progress Notes (Signed)
Echo attempted. Patient eating. Will attempt again as schedule permits.

## 2021-09-12 NOTE — Discharge Instructions (Signed)
Follow with Primary MD Koirala, Dibas, MD in 7 days   Get CBC, CMP, 2 view Chest X ray -  checked next visit within 1 week by Primary MD    Activity: As tolerated with Full fall precautions use walker/cane & assistance as needed  Disposition Home   Diet: Heart Healthy Low Carb  Special Instructions: If you have smoked or chewed Tobacco  in the last 2 yrs please stop smoking, stop any regular Alcohol  and or any Recreational drug use.  On your next visit with your primary care physician please Get Medicines reviewed and adjusted.  Please request your Prim.MD to go over all Hospital Tests and Procedure/Radiological results at the follow up, please get all Hospital records sent to your Prim MD by signing hospital release before you go home.  If you experience worsening of your admission symptoms, develop shortness of breath, life threatening emergency, suicidal or homicidal thoughts you must seek medical attention immediately by calling 911 or calling your MD immediately  if symptoms less severe.  You Must read complete instructions/literature along with all the possible adverse reactions/side effects for all the Medicines you take and that have been prescribed to you. Take any new Medicines after you have completely understood and accpet all the possible adverse reactions/side effects.

## 2021-09-12 NOTE — Progress Notes (Signed)
VASCULAR LAB    Carotid duplex has been performed.  See CV proc for preliminary results.   Eliaz Fout, RVT 09/12/2021, 10:05 AM

## 2021-09-12 NOTE — Progress Notes (Signed)
PROGRESS NOTE                                                                                                                                                                                                             Patient Demographics:    Allen Dyer, is a 86 y.o. male, DOB - 04/22/1936, OJJ:[id number]  Outpatient Primary MD for the patient is Koirala, Dibas, MD    LOS - 1  Admit date - 09/11/2021    Chief Complaint  Patient presents with   Cerebrovascular Accident       Brief Narrative (HPI from H&P)  86 y.o. male with medical history significant of chronic combined CHF; stage 4 CKD; HTN; HLD; multiple myeloma; prostate CA s/p XRT; and DM presenting with some dizziness and blurriness in vision work-up positive for stroke.   Subjective:    Allen Dyer today has, No headache, No chest pain, No abdominal pain - No Nausea, No new weakness tingling or numbness, no cough or shortness of breath, mild dizziness and blurriness in the right eye   Assessment  & Plan :    Dizziness and blurriness in the right eye.  MRI positive for multiple acute to subacute bilateral cerebellar infarcts in the AICA and some mild distal right PCA and also left thalamostriate artery ischemia - he undergo full stroke work-up, echocardiogram and MRA neck pending, case discussed with neurologist Dr. Leonie Man, for now dual antiplatelet therapy for 3 months along with statin as LDL was above goal, A1c stable.  PT-OT and speech eval also pending.  2.  Dyslipidemia.  Low-dose statin.  3.  Hypertension.  Allow for permissive hypertension for now.  4.  Bilateral vertebral artery disease.  Secondary prevention as above, MRA head pending.  5.  Combined chronic systolic and diastolic heart failure with EF 35%.  Repeat echo pending, follows with Dr. Aundra Dubin, currently compensated continue low-dose Imdur.  Resume Coreg and hydralazine in the next 48 hours  once he recovers from acute stroke phase  6.  CKD stage IV.  With a baseline Creatinine of around 2.5.  Monitor.  7.  Obesity.  BMI now 29, continue home regimen.  8.  History of multiple myeloma.  No acute issues follows with Dr. Alvy Bimler.  9.  DM type II.  A1c satisfactory.  Currently on SSI, post discharge home  regimen.  Lab Results  Component Value Date   HGBA1C 6.7 (H) 09/12/2021    CBG (last 3)  Recent Labs    09/11/21 2145 09/12/21 0815  GLUCAP 149* 202*    Lab Results  Component Value Date   CHOL 142 09/12/2021   HDL 35 (L) 09/12/2021   LDLCALC 79 09/12/2021   TRIG 139 09/12/2021   CHOLHDL 4.1 09/12/2021          Condition - Fair  Family Communication  :  None  Code Status :  None  Consults  :  Neuro  PUD Prophylaxis :    Procedures  :     MRI - 1. Multiple acute to subacute bilateral cerebellar and AICA territory infarcts. Superimposed mild distal right PCA and possible also left thalamostriate artery ischemia. No associated hemorrhage or mass effect. 2. Poor flow or occlusion of the distal right vertebral artery in the upper neck and at the skull base. CTA head and neck would best evaluate further. 3. Chronic lacunar infarction in the left pons. #1 and #2 were discussed with the patient's daughter at 1016 hours. And I advised transfer to [facility] Emergency Department for further Neurologic evaluation and treatment. She understood and was agreeable.  Carotid duplex of the neck -  Right Carotid: The extracranial vessels were near-normal with only minimal wall                thickening or plaque. Left Carotid: The extracranial vessels were near-normal with only minimal wall               thickening or plaque. Vertebrals:  Bilateral vertebral arteries demonstrate antegrade flow. Subclavians: Normal flow hemodynamics were seen in bilateral subclavian              arteries  MRA Head -  Intracranial right vertebral artery shows minimal flow only  distally, which is probably retrograde. High-grade stenosis of the distal left vertebral artery. Atherosclerosis of the basilar with up to moderate stenosis. Bilateral intracranial ICA atherosclerosis with up to moderate stenosis  MRA neck -  Echocardiogram -        Disposition Plan  :    Status is: Inpatient   DVT Prophylaxis  :    enoxaparin (LOVENOX) injection 30 mg Start: 09/12/21 1230   Lab Results  Component Value Date   PLT 143 (L) 09/11/2021    Diet :  Diet Order             Diet heart healthy/carb modified Room service appropriate? Yes; Fluid consistency: Thin  Diet effective ____                    Inpatient Medications  Scheduled Meds:  allopurinol  50 mg Oral Daily   aspirin  300 mg Rectal Daily   Or   aspirin  325 mg Oral Daily   enoxaparin (LOVENOX) injection  30 mg Subcutaneous Q24H   ezetimibe  10 mg Oral Daily   gabapentin  300 mg Oral BID   insulin aspart  0-5 Units Subcutaneous QHS   insulin aspart  0-9 Units Subcutaneous TID WC   isosorbide mononitrate  30 mg Oral Daily   vitamin B-12  500 mcg Oral q morning   Continuous Infusions: PRN Meds:.acetaminophen **OR** acetaminophen (TYLENOL) oral liquid 160 mg/5 mL **OR** acetaminophen, ondansetron (ZOFRAN) IV, senna-docusate  Antibiotics  :    Anti-infectives (From admission, onward)    None  Time Spent in minutes  30   Lala Lund M.D on 09/12/2021 at 11:11 AM  To page go to [email or web address]   [facility] -  Office  [phone]  See all Orders from today for further details    Objective:   Vitals:   09/11/21 2325 09/11/21 2359 09/12/21 0400 09/12/21 0813  BP: (!) 144/84  135/84 131/82  Pulse: 79 81 66   Resp: _0 Temp: 97.8 F (36.6 C)  97.9 F (36.6 C) 98.4 F (36.9 C)  TempSrc: Oral  Oral Oral  SpO2: 96% 95% 96%   Weight:      Height:        Wt Readings from Last 3 Encounters:  09/11/21 87.7 kg  08/10/21 98.9 kg  08/04/21 98 kg      Intake/Output Summary (Last 24 hours) at 09/12/2021 1111 Last data filed at 09/12/2021 0407 Gross per 24 hour  Intake --  Output 650 ml  Net -650 ml     Physical Exam  Awake Alert, No new F.N deficits, still has some dizziness upon walking and some blurriness in the right eye [place].AT,PERRAL Supple Neck, No JVD,   Symmetrical Chest wall movement, Good air movement bilaterally, CTAB RRR,No Gallops,Rubs or new Murmurs,  +ve B.Sounds, Abd Soft, No tenderness,   No Cyanosis, Clubbing or edema        Data Review:    CBC Recent Labs  Lab 09/11/21 1040  WBC 5.2  HGB 11.3*  HCT 33.8*  PLT 143*  MCV 101.2*  MCH 33.8  MCHC 33.4  RDW 13.5  LYMPHSABS 1.0  MONOABS 0.6  EOSABS 0.3  BASOSABS 0.0    Electrolytes Recent Labs  Lab 09/11/21 1040 09/12/21 0212  NA 139 141  K 3.7 3.7  CL 103 104  CO2 31 31  GLUCOSE 140* 110*  BUN 20 25*  CREATININE 2.32* 2.45*  CALCIUM 9.1 9.3  AST 18  --   ALT 15  --   ALKPHOS 54  --   BILITOT 0.7  --   ALBUMIN 3.2*  --   INR 1.0  --   HGBA1C  --  6.7*    ------------------------------------------------------------------------------------------------------------------ Recent Labs    09/12/21 0212  CHOL 142  HDL 35*  LDLCALC 79  TRIG 139  CHOLHDL 4.1    Lab Results  Component Value Date   HGBA1C 6.7 (H) 09/12/2021    Radiology Reports MR ANGIO HEAD WO CONTRAST  Result Date: 09/11/2021 CLINICAL DATA:  Stroke, follow up EXAM: MRA HEAD WITHOUT CONTRAST TECHNIQUE: Angiographic images of the Circle of Willis were acquired using MRA technique without intravenous contrast. COMPARISON:  None Available. FINDINGS: Anterior circulation: Intracranial internal carotid arteries are patent with atherosclerotic irregularity. There is up to moderate stenosis of the cavernous portions. Moderate stenosis of the left supraclinoid portion. Anterior cerebral arteries are patent. 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 Middle cerebral  arteries are patent. Posterior circulation: Intracranial left vertebral artery is patent. Severe stenosis of the distal left vertebral artery. There is flow related enhancement only of the distal right intracranial vertebral artery, which may reflect retrograde flow. Basilar artery is patent with atherosclerotic irregularity and up to moderate stenosis. Patent left PICA and AICA identified. Superior cerebellar artery origins are patent. Posterior cerebral arteries are patent. There is diffuse narrowing of the left P2 PCA. IMPRESSION: Intracranial right vertebral artery shows minimal flow only distally, which is probably retrograde. High-grade stenosis of the distal left vertebral artery.  Atherosclerosis of the basilar with up to moderate stenosis. Bilateral intracranial ICA atherosclerosis with up to moderate stenosis. Electronically Signed   By: Macy Mis M.D.   On: 09/11/2021 15:34   MR BRAIN WO CONTRAST  Result Date: 09/11/2021 CLINICAL DATA:  86 year old male with recent dizziness, facial numbness. EXAM: MRI HEAD WITHOUT CONTRAST TECHNIQUE: Multiplanar, multiecho pulse sequences of the brain and surrounding structures were obtained without intravenous contrast. COMPARISON:  Head CT 08/04/2021. FINDINGS: Brain: Multifocal bilateral cerebellar hemisphere restricted diffusion with T2 and FLAIR hyperintensity compatible with multiple acute to subacute mostly AICA territory infarcts. No associated hemorrhage or mass effect. Additional more subtle restricted diffusion in the right occipital pole series 3, image 79, also with mild T2 and FLAIR hyperintensity. And questionable superimposed abnormal diffusion in the superior left thalamus, punctate on series 3, image 84. No other restricted diffusion. No midline shift, mass effect, evidence of mass lesion, ventriculomegaly, extra-axial collection or acute intracranial hemorrhage. Cervicomedullary junction and pituitary are within normal limits. Background gray  and white matter signal is within normal limits for age. No chronic cortical encephalomalacia or chronic cerebral blood products identified. Outside of the left thalamus the deep gray matter nuclei appear normal. There is chronic lacunar infarction in the left paracentral pons (series 11, image 48). Vascular: Abnormal distal right vertebral artery flow void in the upper neck and at the skull base (series 7, images 1-4. Other Major intracranial vascular flow voids are preserved. Skull and upper cervical spine: Negative. Visualized bone marrow signal is within normal limits. Sinuses/Orbits: Postoperative changes to both globes. Minor paranasal sinus mucosal thickening. Other: Visible internal auditory structures appear normal. Trace mastoid air cell fluid. Negative visible scalp and face. IMPRESSION: 1. Multiple acute to subacute bilateral cerebellar and AICA territory infarcts. Superimposed mild distal right PCA and possible also left thalamostriate artery ischemia. No associated hemorrhage or mass effect. 2. Poor flow or occlusion of the distal right vertebral artery in the upper neck and at the skull base. CTA head and neck would best evaluate further. 3. Chronic lacunar infarction in the left pons. #1 and #2 were discussed with the patient's daughter at 1016 hours. And I advised transfer to [facility] Emergency Department for further Neurologic evaluation and treatment. She understood and was agreeable. Electronically Signed   By: Genevie Ann M.D.   On: 09/11/2021 10:30   VAS US CAROTID  Result Date: 09/12/2021 Carotid Arterial Duplex Study Patient Name:  Allen Dyer  Date of Exam:   09/12/2021 Medical Rec #: [id number]      Accession #:    [id number] Date of Birth: 11-29-1935      Patient Gender: M Patient Age:   51 years Exam Location:  [facility] Procedure:      VAS US CAROTID Referring Phys: ERIC LINDZEN --------------------------------------------------------------------------------   Indications:      CVA, Speech disturbance and Dizziness. Risk Factors:     Hypertension, hyperlipidemia, Diabetes, coronary artery                   disease. Other Factors:    CHF, pulmonary venous Hypertension, CKD III. Comparison Study: Prior normal carotid duplex done 06/03/19 Performing Technologist: Sharion Dove RVS  Examination Guidelines: A complete evaluation includes B-mode imaging, spectral Doppler, color Doppler, and power Doppler as needed of all accessible portions of each vessel. Bilateral testing is considered an integral part of a complete examination. Limited examinations for reoccurring indications may be performed as noted.  Right Carotid Findings: +----------+--------+--------+--------+------------------+------------------+  PSV cm/sEDV cm/sStenosisPlaque DescriptionComments           +----------+--------+--------+--------+------------------+------------------+ CCA Prox  138     24                                intimal thickening +----------+--------+--------+--------+------------------+------------------+ CCA Distal104     20                                intimal thickening +----------+--------+--------+--------+------------------+------------------+ ICA Prox  101     24              heterogenous                         +----------+--------+--------+--------+------------------+------------------+ ICA Distal51      27                                tortuous           +----------+--------+--------+--------+------------------+------------------+ ECA       38      6                                 tortuous           +----------+--------+--------+--------+------------------+------------------+ +----------+--------+-------+--------+-------------------+           PSV cm/sEDV cmsDescribeArm Pressure (mmHG) +----------+--------+-------+--------+-------------------+ [id number]                                          +----------+--------+-------+--------+-------------------+ +---------+--------+--+--------+--+---------+ VertebralPSV cm/s90EDV cm/s24Antegrade +---------+--------+--+--------+--+---------+  Left Carotid Findings: +----------+--------+--------+--------+------------------+------------------+           PSV cm/sEDV cm/sStenosisPlaque DescriptionComments           +----------+--------+--------+--------+------------------+------------------+ CCA Prox  119     13                                intimal thickening +----------+--------+--------+--------+------------------+------------------+ CCA Distal67      14                                intimal thickening +----------+--------+--------+--------+------------------+------------------+ ICA Prox  46      17              heterogenous                         +----------+--------+--------+--------+------------------+------------------+ ICA Mid   94                                                           +----------+--------+--------+--------+------------------+------------------+ ICA Distal61      24                                tortuous           +----------+--------+--------+--------+------------------+------------------+  ECA       92      6                                 tortuous           +----------+--------+--------+--------+------------------+------------------+ +----------+--------+--------+--------+-------------------+           PSV cm/sEDV cm/sDescribeArm Pressure (mmHG) +----------+--------+--------+--------+-------------------+ [id number]                                          +----------+--------+--------+--------+-------------------+ +---------+--------+--+--------+--+---------+ VertebralPSV cm/s32EDV cm/s12Antegrade +---------+--------+--+--------+--+---------+   Summary: Right Carotid: The extracranial vessels were near-normal with only minimal wall                thickening or  plaque. Left Carotid: The extracranial vessels were near-normal with only minimal wall               thickening or plaque. Vertebrals:  Bilateral vertebral arteries demonstrate antegrade flow. Subclavians: Normal flow hemodynamics were seen in bilateral subclavian              arteries. *See table(s) above for measurements and observations.     Preliminary

## 2021-09-12 NOTE — Progress Notes (Signed)
Initial Nutrition Assessment  DOCUMENTATION CODES:   Not applicable  INTERVENTION:   - Brief diet education provided  - Renal MVI daily  - Encourage PO intake  NUTRITION DIAGNOSIS:   Increased nutrient needs related to chronic illness (CHF, CKD IV) as evidenced by estimated needs.  GOAL:   Patient will meet greater than or equal to 90% of their needs  MONITOR:   PO intake, Labs, Weight trends  REASON FOR ASSESSMENT:   Consult Assessment of nutrition requirement/status  ASSESSMENT:   86 year old male who presented to the ED on 5/21 with CVA. PMH of CHF, CKD stage IV, HTN, HLD, multiple myeloma, prostate cancer s/p XRT, T2DM.  Spoke with pt at bedside. Noted 100% completed lunch meal tray on tray table.  Pt reports good/normal appetite. He denies any issues chewing or swallowing and denies N/V at this time. Pt reports that he eats well at home and recently has been cutting back on portion sizes in an attempt to lose weight. Pt reports that he used to weigh 220 lbs and now weighs 209 lbs. Pt confirms that this weight loss was intentional and has occurred over the last 2-3 months. Reviewed weight history in chart. Noted pt with a 14 kg (~31 lb) weight loss since 06/23/21. Current weight of 87.7 kg (193 lbs) is below pt's reported current weight of 209 lbs.  Pt reports that his daughter has had him on a renal diet due to his worsening kidney function. He reports that the renal diet is horrible but unable to give many specifics other than that it includes a lot of chicken (which he is tired of). Discussed other protein sources with pt. Pt also with questions regarding beverage intake and which beverages other than water are appropriate for him to consume. Diet education provided.  Pt reports that he takes several vitamin/mineral supplements at home including fish oil, vitamin D, vitamin C, and garlic. RD to order renal MVI for pt during admission. Encouraged ongoing adequate PO  intake with a focus on protein-rich foods.  Medications reviewed and include: SSI, vitamin B-12 500 mcg daily  Labs reviewed: BUN 25, creatinine 2.45, platelets 143, hemoglobin A1C 6.7 CBG's: 149-202  NUTRITION - FOCUSED PHYSICAL EXAM:  Flowsheet Row Most Recent Value  Orbital Region No depletion  Upper Arm Region No depletion  Thoracic and Lumbar Region No depletion  Buccal Region No depletion  Temple Region No depletion  Clavicle Bone Region Mild depletion  Clavicle and Acromion Bone Region No depletion  Scapular Bone Region No depletion  Dorsal Hand No depletion  Patellar Region No depletion  Anterior Thigh Region No depletion  Posterior Calf Region No depletion  Edema (RD Assessment) None  Hair Reviewed  Eyes Reviewed  Mouth Reviewed  Skin Reviewed  Nails Reviewed       Diet Order:   Diet Order             Diet heart healthy/carb modified Room service appropriate? Yes; Fluid consistency: Thin  Diet effective ____                   EDUCATION NEEDS:   Education needs have been addressed  Skin:  Skin Assessment: Reviewed RN Assessment  Last BM:  09/10/21  Height:   Ht Readings from Last 1 Encounters:  09/11/21 5' 7.5" (1.715 m)    Weight:   Wt Readings from Last 1 Encounters:  09/11/21 87.7 kg    BMI:  Body mass index is 29.83 kg/m.  Estimated Nutritional Needs:   Kcal:  1800-2000  Protein:  85-100 grams  Fluid:  1.8-2.0 L    Gustavus Bryant, MS, RD, LDN Inpatient Clinical Dietitian Please see AMiON for contact information.

## 2021-09-12 NOTE — Progress Notes (Signed)
STROKE TEAM PROGRESS NOTE   INTERVAL HISTORY Patient is seen in his room with his daughter and son in law at the bedside.  He presented with dizziness that had been present for about three days.  Outpatient MRI reveled bilateral subacute cerebellar strokes, and patient was directed to come to the hospital  Vitals:   09/11/21 2359 09/12/21 0400 09/12/21 0813 09/12/21 1143  BP:  135/84 131/82 139/86  Pulse: 81 66    Resp: '18 20 20   '$ Temp:  97.9 F (36.6 C) 98.4 F (36.9 C) 97.6 F (36.4 C)  TempSrc:  Oral Oral Oral  SpO2: 95% 96%    Weight:      Height:       CBC:  Recent Labs  Lab 09/11/21 1040  WBC 5.2  NEUTROABS 3.2  HGB 11.3*  HCT 33.8*  MCV 101.2*  PLT 161*   Basic Metabolic Panel:  Recent Labs  Lab 09/11/21 1040 09/12/21 0212  NA 139 141  K 3.7 3.7  CL 103 104  CO2 31 31  GLUCOSE 140* 110*  BUN 20 25*  CREATININE 2.32* 2.45*  CALCIUM 9.1 9.3   Lipid Panel:  Recent Labs  Lab 09/12/21 0212  CHOL 142  TRIG 139  HDL 35*  CHOLHDL 4.1  VLDL 28  LDLCALC 79   HgbA1c:  Recent Labs  Lab 09/12/21 0212  HGBA1C 6.7*   Urine Drug Screen:  Recent Labs  Lab 09/11/21 1225  LABOPIA NONE DETECTED  COCAINSCRNUR NONE DETECTED  LABBENZ NONE DETECTED  AMPHETMU NONE DETECTED  THCU NONE DETECTED  LABBARB NONE DETECTED    Alcohol Level No results for input(s): ETH in the last 168 hours.  IMAGING past 24 hours MR ANGIO HEAD WO CONTRAST  Result Date: 09/11/2021 CLINICAL DATA:  Stroke, follow up EXAM: MRA HEAD WITHOUT CONTRAST TECHNIQUE: Angiographic images of the Circle of Willis were acquired using MRA technique without intravenous contrast. COMPARISON:  None Available. FINDINGS: Anterior circulation: Intracranial internal carotid arteries are patent with atherosclerotic irregularity. There is up to moderate stenosis of the cavernous portions. Moderate stenosis of the left supraclinoid portion. Anterior cerebral arteries are patent. 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 Middle cerebral arteries are patent. Posterior circulation: Intracranial left vertebral artery is patent. Severe stenosis of the distal left vertebral artery. There is flow related enhancement only of the distal right intracranial vertebral artery, which may reflect retrograde flow. Basilar artery is patent with atherosclerotic irregularity and up to moderate stenosis. Patent left PICA and AICA identified. Superior cerebellar artery origins are patent. Posterior cerebral arteries are patent. There is diffuse narrowing of the left P2 PCA. IMPRESSION: Intracranial right vertebral artery shows minimal flow only distally, which is probably retrograde. High-grade stenosis of the distal left vertebral artery. Atherosclerosis of the basilar with up to moderate stenosis. Bilateral intracranial ICA atherosclerosis with up to moderate stenosis. Electronically Signed   By: Macy Mis M.D.   On: 09/11/2021 15:34   VAS US CAROTID  Result Date: 09/12/2021 Carotid Arterial Duplex Study Patient Name:  ELWOOD BAZINET  Date of Exam:   09/12/2021 Medical Rec #: [id number]      Accession #:    [id number] Date of Birth: 11/01/1935      Patient Gender: M Patient Age:   86 years Exam Location:  [facility] Procedure:      VAS US CAROTID Referring Phys: ERIC LINDZEN --------------------------------------------------------------------------------  Indications:      CVA, Speech disturbance and Dizziness. Risk  Factors:     Hypertension, hyperlipidemia, Diabetes, coronary artery                   disease. Other Factors:    CHF, pulmonary venous Hypertension, CKD III. Comparison Study: Prior normal carotid duplex done 06/03/19 Performing Technologist: Sharion Dove RVS  Examination Guidelines: A complete evaluation includes B-mode imaging, spectral Doppler, color Doppler, and power Doppler as needed of all accessible portions of each vessel. Bilateral testing is considered an integral part of a complete  examination. Limited examinations for reoccurring indications may be performed as noted.  Right Carotid Findings: +----------+--------+--------+--------+------------------+------------------+           PSV cm/sEDV cm/sStenosisPlaque DescriptionComments           +----------+--------+--------+--------+------------------+------------------+ CCA Prox  138     24                                intimal thickening +----------+--------+--------+--------+------------------+------------------+ CCA Distal104     20                                intimal thickening +----------+--------+--------+--------+------------------+------------------+ ICA Prox  101     24              heterogenous                         +----------+--------+--------+--------+------------------+------------------+ ICA Distal51      27                                tortuous           +----------+--------+--------+--------+------------------+------------------+ ECA       38      6                                 tortuous           +----------+--------+--------+--------+------------------+------------------+ +----------+--------+-------+--------+-------------------+           PSV cm/sEDV cmsDescribeArm Pressure (mmHG) +----------+--------+-------+--------+-------------------+ [id number]                                         +----------+--------+-------+--------+-------------------+ +---------+--------+--+--------+--+---------+ VertebralPSV cm/s90EDV cm/s24Antegrade +---------+--------+--+--------+--+---------+  Left Carotid Findings: +----------+--------+--------+--------+------------------+------------------+           PSV cm/sEDV cm/sStenosisPlaque DescriptionComments           +----------+--------+--------+--------+------------------+------------------+ CCA Prox  119     13                                intimal thickening  +----------+--------+--------+--------+------------------+------------------+ CCA Distal67      14                                intimal thickening +----------+--------+--------+--------+------------------+------------------+ ICA Prox  46      17              heterogenous                         +----------+--------+--------+--------+------------------+------------------+  ICA Mid   94                                                           +----------+--------+--------+--------+------------------+------------------+ ICA Distal61      24                                tortuous           +----------+--------+--------+--------+------------------+------------------+ ECA       92      6                                 tortuous           +----------+--------+--------+--------+------------------+------------------+ +----------+--------+--------+--------+-------------------+           PSV cm/sEDV cm/sDescribeArm Pressure (mmHG) +----------+--------+--------+--------+-------------------+ [id number]                                          +----------+--------+--------+--------+-------------------+ +---------+--------+--+--------+--+---------+ VertebralPSV cm/s32EDV cm/s12Antegrade +---------+--------+--+--------+--+---------+   Summary: Right Carotid: The extracranial vessels were near-normal with only minimal wall                thickening or plaque. Left Carotid: The extracranial vessels were near-normal with only minimal wall               thickening or plaque. Vertebrals:  Bilateral vertebral arteries demonstrate antegrade flow. Subclavians: Normal flow hemodynamics were seen in bilateral subclavian              arteries. *See table(s) above for measurements and observations.  Electronically signed by Antony Contras MD on 09/12/2021 at 11:19:59 AM.    Final     PHYSICAL EXAM General:  Alert, well-developed, well-nourished patient in no acute  distress Respiratory:  Regular, unlabored respirations on room air  NEURO:  Mental Status: AA&Ox3  Speech/Language: speech is without dysarthria or aphasia.  Fluency, and comprehension intact.  Cranial Nerves:  II: PERRL. Visual fields full.  III, IV, VI: EOMI. Eyelids elevate symmetrically.  V: Sensation is intact to light touch and symmetrical to face.  VII: Smile is symmetrical.  VIII: hearing intact to voice. IX, X: Phonation is normal.  XII: tongue is midline without fasciculations. Motor: 5/5 strength to all muscle groups tested.  Tone: is normal and bulk is normal Sensation- Intact to light touch bilaterally.   Coordination: FTN intact bilaterally, HKS: no ataxia in BLE.  Gait- deferred   ASSESSMENT/PLAN [name] is a 86 y.o. male with history of DM, HTN, HLD, CKD III, CHF with EF 30-35%, pulmonary hypertension and CAD presenting with dizziness that had been present for about three days.  Outpatient MRI reveled bilateral subacute cerebellar strokes, and patient was directed to come to the hospital  Stroke:  bilateral cerebellar infarct likely secondary due to occlusion or stenosis in setting of high grade stenosis of left vertebral artery MRI  multiple acute to subacute bilateral cerebellar and AICA territory infarcts, distal right PCA and left thalamostriate artery ischemia, chronic lacunar infarct in left pons MRA  intracranial right vertebral artery with minimal flow distally, probably retrograde,  moderate stenosis of basilar tip, bilateral intracranial ICA stenosis MRA neck pending Carotid Doppler  minimal thickening or plaque in bilateral carotid arteries 2D Echo pending LDL 79 HgbA1c 6.7 VTE prophylaxis - lovenox    Diet   Diet heart healthy/carb modified Room service appropriate? Yes; Fluid consistency: Thin   No antithrombotic prior to admission, now on aspirin 81 mg daily and clopidogrel 75 mg daily.  Therapy recommendations:  outpatient  PT Disposition:  pending, likely home  Hypertension Home meds:  none Stable Permissive hypertension (OK if < 220/120) but gradually normalize in 5-7 days Long-term BP goal normotensive  Hyperlipidemia Home meds:  none LDL 79, goal < 70 Add zetia 10 mg daily  High intensity statin not indicated due to previous intolerance Continue statin at discharge  Diabetes type II Controlled Home meds:  semaglutide 14 mg PO daily HgbA1c 6.7, goal < 7.0 CBGs Recent Labs    09/12/21 0815 09/12/21 1145 09/12/21 1209  GLUCAP 202* 61* 84    SSI  Other Stroke Risk Factors Advanced Age >/= 55  Former cigarette smoker Hx stroke Coronary artery disease Congestive heart failure  Other Active Problems none  Hospital day # [place] , MSN, AGACNP-BC [facility] See Amion for schedule and pager information 09/12/2021 12:41 PM    To contact Stroke Continuity provider, please refer to [email or web address] After hours, contact General Neurology

## 2021-09-12 NOTE — Evaluation (Signed)
Occupational Therapy Evaluation Patient Details Name: Allen Dyer MRN: [id number] DOB: 04-26-1935 Today's Date: 09/12/2021   History of Present Illness 86 y.o. male presenting 09/11/21 with a CVA. MRI multiple acute to subacute B cerebellar and AICA territory infarcts with concern for R cerebral artery occlusion;   PMH significant of chronic combined CHF; stage 4 CKD; HTN; HLD; multiple myeloma; prostate CA s/p XRT; and DM   Clinical Impression   Prior to this admission patient living with his daughter but reports full independence, still drives, and manages his medications. Currently, patient presenting with minimal impulsivity, visual deficits in R eye (blurring), and is min guard for ADLs and mod I for transfers and ambulation. OT assessing vision with patient reporting he has had blurring of vision in R eye for the last two months, is still able to read near and far if he focuses, advised patient to book appointment with eye doctor post discharge for full assessment. Patient also advised not to drive until cleared by MD with patient in agreement. OT recommending outpatient OT for assessment and completion of higher level cognitive tasks to return to full independence. OT will continue to follow.      Recommendations for follow up therapy are one component of a multi-disciplinary discharge planning process, led by the attending physician.  Recommendations may be updated based on patient status, additional functional criteria and insurance authorization.   Follow Up Recommendations  Outpatient OT    Assistance Recommended at Discharge Intermittent Supervision/Assistance  Patient can return home with the following Direct supervision/assist for medications management;Direct supervision/assist for financial management;Assist for transportation;Help with stairs or ramp for entrance;A little help with walking and/or transfers;A little help with bathing/dressing/bathroom    Functional Status  Assessment  Patient has had a recent decline in their functional status and demonstrates the ability to make significant improvements in function in a reasonable and predictable amount of time.  Equipment Recommendations  None recommended by OT    Recommendations for Other Services       Precautions / Restrictions Precautions Precautions: None      Mobility Bed Mobility Overal bed mobility: Modified Independent             General bed mobility comments: minimal extra time to complete    Transfers Overall transfer level: Modified independent Equipment used: None               General transfer comment: mod I solely for safety as patient was getting up OOB impulsively without assist and from uneven bed surface      Balance                                           ADL either performed or assessed with clinical judgement   ADL Overall ADL's : Needs assistance/impaired Eating/Feeding: Set up;Sitting   Grooming: Set up;Sitting   Upper Body Bathing: Set up;Sitting   Lower Body Bathing: Min guard;Sitting/lateral leans;Sit to/from stand   Upper Body Dressing : Set up;Sitting   Lower Body Dressing: Min guard;Sitting/lateral leans;Sit to/from stand   Toilet Transfer: Min guard;Ambulation   Toileting- Clothing Manipulation and Hygiene: Set up;Sit to/from stand;Sitting/lateral lean       Functional mobility during ADLs: Min guard;Cueing for safety;Cueing for sequencing General ADL Comments: Patient presenting with minimal impulsivity, likely very close to baseline, OT to assess higher level cognitive tasks in further contexts  Vision Baseline Vision/History: 1 Wears glasses Ability to See in Adequate Light: 1 Impaired Patient Visual Report: No change from baseline (Patient has had blurring of vision in R eye for the last two months, is still able to read near and far if he focuses, advised patient to book appointment with eye doctor post  discharge for full assessment) Vision Assessment?: Yes Eye Alignment: Within Functional Limits Ocular Range of Motion: Within Functional Limits Alignment/Gaze Preference: Within Defined Limits Tracking/Visual Pursuits: Able to track stimulus in all quads without difficulty Saccades: Overshoots;Undershoots Convergence: Within functional limits Visual Fields: No apparent deficits     Perception     Praxis      Pertinent Vitals/Pain Pain Assessment Pain Assessment: No/denies pain     Hand Dominance     Extremity/Trunk Assessment Upper Extremity Assessment Upper Extremity Assessment: Overall WFL for tasks assessed   Lower Extremity Assessment Lower Extremity Assessment: Defer to PT evaluation   Cervical / Trunk Assessment Cervical / Trunk Assessment: Normal   Communication Communication Communication: No difficulties   Cognition Arousal/Alertness: Awake/alert Behavior During Therapy: WFL for tasks assessed/performed Overall Cognitive Status: Within Functional Limits for tasks assessed                                 General Comments: minimally impulsive as patient was found to be attempting to get OOB in preparation for lunch     General Comments  VSS on RA    Exercises     Shoulder Instructions      Home Living Family/patient expects to be discharged to:: Private residence Living Arrangements: Children Available Help at Discharge: Family Type of Home: House Home Access: Stairs to enter Technical brewer of Steps: 5 Entrance Stairs-Rails: Right Home Layout: Two level;1/2 bath on main level Alternate Level Stairs-Number of Steps: flight Alternate Level Stairs-Rails: Right Bathroom Shower/Tub: Tub/shower unit         Home Equipment: Cane - single point          Prior Functioning/Environment Prior Level of Function : Independent/Modified Independent             Mobility Comments: began using a cane in the past 4 weeks when  balance issues began ADLs Comments: Reports complete independence        OT Problem List: Decreased activity tolerance;Impaired balance (sitting and/or standing);Decreased coordination;Decreased safety awareness;Decreased cognition;Impaired vision/perception      OT Treatment/Interventions: Self-care/ADL training;Therapeutic exercise;Energy conservation;DME and/or AE instruction;Manual therapy;Therapeutic activities;Cognitive remediation/compensation;Visual/perceptual remediation/compensation;Patient/family education;Balance training    OT Goals(Current goals can be found in the care plan section) Acute Rehab OT Goals Patient Stated Goal: to get back to independence OT Goal Formulation: With patient Time For Goal Achievement: 09/26/21 Potential to Achieve Goals: Good ADL Goals Pt Will Transfer to Toilet: Independently;ambulating Additional ADL Goal #1: Patient will be able to gather items for ADL or IADL task without cues in to reorient to task and appropriate accuracy. Additional ADL Goal #2: Patient will be able to complete pillbox test with no errors to resume functional independence at discharge.  OT Frequency: Min 2X/week    Co-evaluation              AM-PAC OT "6 Clicks" Daily Activity     Outcome Measure Help from another person eating meals?: A Little Help from another person taking care of personal grooming?: A Little Help from another person toileting, which includes using toliet, bedpan, or urinal?: A  Little Help from another person bathing (including washing, rinsing, drying)?: A Little Help from another person to put on and taking off regular upper body clothing?: A Little Help from another person to put on and taking off regular lower body clothing?: A Little 6 Click Score: 18   End of Session Nurse Communication: Mobility status  Activity Tolerance: Patient tolerated treatment well Patient left: in chair;with call bell/phone within reach  OT Visit Diagnosis:  Unsteadiness on feet (R26.81);Other abnormalities of gait and mobility (R26.89)                Time: 3958-4417 OT Time Calculation (min): 17 min Charges:  OT General Charges $OT Visit: 1 Visit OT Evaluation $OT Eval Moderate Complexity: 1 Mod  Corinne Ports E. Kendrik Mcshan, OTR/L Acute Rehabilitation Services [phone] [place] 09/12/2021, 1:37 PM

## 2021-09-12 NOTE — Plan of Care (Signed)
  Problem: Education: Goal: Knowledge of General Education information will improve Description Including pain rating scale, medication(s)/side effects and non-pharmacologic comfort measures Outcome: Progressing   Problem: Health Behavior/Discharge Planning: Goal: Ability to manage health-related needs will improve Outcome: Progressing   

## 2021-09-12 NOTE — Evaluation (Signed)
Physical Therapy Evaluation Patient Details Name: Allen Dyer MRN: [id number] DOB: [birth year] Today's Date: 09/12/2021  History of Present Illness  86 y.o. male presenting 09/11/21 with a CVA. MRI multiple acute to subacute B cerebellar and AICA territory infarcts with concern for R cerebral artery occlusion;   PMH significant of chronic combined CHF; stage 4 CKD; HTN; HLD; multiple myeloma; prostate CA s/p XRT; and DM  Clinical Impression   Pt admitted secondary to problem above with deficits below. PTA patient was living with children and independent with all activity. Pt currently requires supervision for ambulation with decr velocity and guarded posture noted. Patient did not experience any dizziness while mobilizing, but did feel "off balance, like I might fall" when he returned to sitting in chair. Scored 47/56 on Berg Balance Assessment, just above cut-off score of 45 which indicates near 100% chance of falling.  Anticipate patient will benefit from PT to address problems listed below.Will continue to follow acutely to maximize functional mobility independence and safety.          Recommendations for follow up therapy are one component of a multi-disciplinary discharge planning process, led by the attending physician.  Recommendations may be updated based on patient status, additional functional criteria and insurance authorization.  Follow Up Recommendations Outpatient PT    Assistance Recommended at Discharge Set up Supervision/Assistance  Patient can return home with the following  Help with stairs or ramp for entrance    Equipment Recommendations None recommended by PT  Recommendations for Other Services       Functional Status Assessment Patient has had a recent decline in their functional status and demonstrates the ability to make significant improvements in function in a reasonable and predictable amount of time.     Precautions / Restrictions Precautions Precautions:  None      Mobility  Bed Mobility               General bed mobility comments: NT up in recliner    Transfers Overall transfer level: Independent Equipment used: None                    Ambulation/Gait Ambulation/Gait assistance: Supervision Gait Distance (Feet): 160 Feet Assistive device: None Gait Pattern/deviations: Step-through pattern, Decreased stride length Gait velocity: very slow; reports slower than his usual Gait velocity interpretation: 1.31 - 2.62 ft/sec, indicative of limited community ambulator   General Gait Details: cautious with no arm swing (arms held in low guard position); able to turn head left and right with no symptoms  Stairs            Wheelchair Mobility    Modified Rankin (Stroke Patients Only) Modified Rankin (Stroke Patients Only) Pre-Morbid Rankin Score: No symptoms Modified Rankin: Moderately severe disability     Balance                                 Standardized Balance Assessment Standardized Balance Assessment : Berg Balance Test Berg Balance Test Sit to Stand: Able to stand without using hands and stabilize independently Standing Unsupported: Able to stand safely 2 minutes Sitting with Back Unsupported but Feet Supported on Floor or Stool: Able to sit safely and securely 2 minutes Stand to Sit: Sits safely with minimal use of hands Transfers: Able to transfer safely, minor use of hands Standing Unsupported with Eyes Closed: Able to stand 10 seconds safely Standing Ubsupported with Feet Together: Able to  place feet together independently and stand 1 minute safely From Standing, Reach Forward with Outstretched Arm: Can reach confidently >25 cm (10") From Standing Position, Pick up Object from Floor: Able to pick up shoe, needs supervision From Standing Position, Turn to Look Behind Over each Shoulder: Looks behind one side only/other side shows less weight shift Turn 360 Degrees: Needs close  supervision or verbal cueing Standing Unsupported, Alternately Place Feet on Step/Stool: Able to stand independently and safely and complete 8 steps in 20 seconds Standing Unsupported, One Foot in Front: Able to plae foot ahead of the other independently and hold 30 seconds Standing on One Leg: Tries to lift leg/unable to hold 3 seconds but remains standing independently Total Score: 47         Pertinent Vitals/Pain Pain Assessment Pain Assessment: No/denies pain    Home Living Family/patient expects to be discharged to:: Private residence Living Arrangements: Children Available Help at Discharge: Family Type of Home: House Home Access: Stairs to enter Entrance Stairs-Rails: Right Entrance Stairs-Number of Steps: 5 Alternate Level Stairs-Number of Steps: flight Home Layout: Two level;1/2 bath on main level Home Equipment: Cane - single point      Prior Function Prior Level of Function : Independent/Modified Independent             Mobility Comments: began using a cane in the past 4 weeks when balance issues began       Hand Dominance        Extremity/Trunk Assessment   Upper Extremity Assessment Upper Extremity Assessment: Defer to OT evaluation    Lower Extremity Assessment Lower Extremity Assessment: Overall WFL for tasks assessed    Cervical / Trunk Assessment Cervical / Trunk Assessment: Normal  Communication   Communication: No difficulties  Cognition Arousal/Alertness: Awake/alert Behavior During Therapy: WFL for tasks assessed/performed Overall Cognitive Status: Within Functional Limits for tasks assessed                                          General Comments      Exercises     Assessment/Plan    PT Assessment Patient needs continued PT services  PT Problem List Decreased balance;Decreased mobility;Decreased knowledge of use of DME       PT Treatment Interventions DME instruction;Gait training;Stair  training;Functional mobility training;Therapeutic activities;Balance training;Neuromuscular re-education;Patient/family education    PT Goals (Current goals can be found in the Care Plan section)  Acute Rehab PT Goals Patient Stated Goal: get back to walking like he used to PT Goal Formulation: With patient Time For Goal Achievement: 09/26/21 Potential to Achieve Goals: Good    Frequency Min 4X/week     Co-evaluation               AM-PAC PT "6 Clicks" Mobility  Outcome Measure Help needed turning from your back to your side while in a flat bed without using bedrails?: None Help needed moving from lying on your back to sitting on the side of a flat bed without using bedrails?: None Help needed moving to and from a bed to a chair (including a wheelchair)?: A Little Help needed standing up from a chair using your arms (e.g., wheelchair or bedside chair)?: A Little Help needed to walk in hospital room?: A Little Help needed climbing 3-5 steps with a railing? : A Little 6 Click Score: 20    End of Session Equipment  Utilized During Treatment: Gait belt Activity Tolerance: Patient tolerated treatment well Patient left: in chair;with call bell/phone within reach;with nursing/sitter in room Nurse Communication: Mobility status PT Visit Diagnosis: Dizziness and giddiness (R42);Unsteadiness on feet (R26.81)    Time: 1292-9090 PT Time Calculation (min) (ACUTE ONLY): 16 min   Charges:   PT Evaluation $PT Eval Low Complexity: [place], PT Acute Rehabilitation Services  Pager [phone] Office [phone]   Rexanne Mano 09/12/2021, 9:32 AM

## 2021-09-12 NOTE — Progress Notes (Signed)
  Echocardiogram 2D Echocardiogram has been performed.  Allen Dyer 09/12/2021, 2:41 PM

## 2021-09-13 ENCOUNTER — Other Ambulatory Visit: Payer: Self-pay | Admitting: *Deleted

## 2021-09-13 ENCOUNTER — Other Ambulatory Visit (HOSPITAL_COMMUNITY): Payer: Self-pay

## 2021-09-13 ENCOUNTER — Inpatient Hospital Stay (HOSPITAL_COMMUNITY): Payer: Medicare Other

## 2021-09-13 DIAGNOSIS — I639 Cerebral infarction, unspecified: Secondary | ICD-10-CM | POA: Diagnosis not present

## 2021-09-13 LAB — BASIC METABOLIC PANEL
Anion gap: 4 — ABNORMAL LOW (ref 5–15)
BUN: 23 mg/dL (ref 8–23)
CO2: 32 mmol/L (ref 22–32)
Calcium: 9.2 mg/dL (ref 8.9–10.3)
Chloride: 107 mmol/L (ref 98–111)
Creatinine, Ser: 2.14 mg/dL — ABNORMAL HIGH (ref 0.61–1.24)
GFR, Estimated: 30 mL/min — ABNORMAL LOW (ref >60)
Glucose, Bld: 93 mg/dL (ref 70–99)
Potassium: 4.3 mmol/L (ref 3.5–5.1)
Sodium: 143 mmol/L (ref 135–145)

## 2021-09-13 LAB — GLUCOSE, CAPILLARY
Glucose-Capillary: 93 mg/dL (ref 70–99)
Glucose-Capillary: 101 mg/dL — ABNORMAL HIGH (ref 70–99)

## 2021-09-13 MED ORDER — ISOSORBIDE MONONITRATE ER 30 MG PO TB24: 30.0000 mg | ORAL_TABLET | Freq: Every morning | ORAL | Status: DC

## 2021-09-13 MED ORDER — EZETIMIBE 10 MG PO TABS
10.0000 mg | ORAL_TABLET | Freq: Every day | ORAL | 3 refills | Status: DC
  Filled 2021-09-13: qty 30, 30d supply, fill #0

## 2021-09-13 MED ORDER — PANTOPRAZOLE SODIUM 40 MG PO TBEC
40.0000 mg | DELAYED_RELEASE_TABLET | Freq: Every day | ORAL | 2 refills | Status: DC
  Filled 2021-09-13: qty 30, 30d supply, fill #0

## 2021-09-13 MED ORDER — CLOPIDOGREL BISULFATE 75 MG PO TABS
75.0000 mg | ORAL_TABLET | Freq: Every day | ORAL | 2 refills | Status: DC
  Filled 2021-09-13: qty 30, 30d supply, fill #0

## 2021-09-13 MED ORDER — HYDRALAZINE HCL 50 MG PO TABS: 50.0000 mg | ORAL_TABLET | Freq: Three times a day (TID) | ORAL | Status: DC

## 2021-09-13 MED ORDER — ASPIRIN 81 MG PO CHEW
81.0000 mg | CHEWABLE_TABLET | Freq: Every day | ORAL | 11 refills | Status: DC
  Filled 2021-09-13: qty 30, 30d supply, fill #0

## 2021-09-13 NOTE — Progress Notes (Signed)
Pt placed on CPAP for bed. RT will cont to monitor as needed.  

## 2021-09-13 NOTE — TOC Transition Note (Signed)
Transition of Care [facility]) - CM/SW Discharge Note   Patient Details  Name: Allen Dyer MRN: [id number] Date of Birth: 04-14-1936  [facility] CM/SW Contact:  Carles Collet, RN Phone Number: 09/13/2021, 11:13 AM   Clinical Narrative:   Damaris Schooner w patient a tbedside with daughter. He states that he wouold not have any transportation to OP PT and requested [place] instead. Discussed providers and he would like [place]. No other TOC needs identified.     Final next level of care: [place] Barriers to Discharge: No Barriers Identified   Patient Goals and CMS Choice Patient states their goals for this hospitalization and ongoing recovery are:: to go home CMS Medicare.gov Compare Post Acute Care list provided to:: Patient Choice offered to / list presented to : Patient  Discharge Placement                       Discharge Plan and Services                DME Arranged: N/A         HH Arranged: PT, OT HH Agency: [place] Date [facility] Agency Contacted: 09/13/21 Time [place]: 1112 Representative spoke with at [place]: [place] Determinants of Health ([place]) Interventions     Readmission Risk Interventions     View : No data to display.

## 2021-09-13 NOTE — Plan of Care (Signed)
  Problem: Education: Goal: Knowledge of General Education information will improve Description: Including pain rating scale, medication(s)/side effects and non-pharmacologic comfort measures Outcome: Completed/Met   Problem: Health Behavior/Discharge Planning: Goal: Ability to manage health-related needs will improve Outcome: Completed/Met   Problem: Clinical Measurements: Goal: Ability to maintain clinical measurements within normal limits will improve Outcome: Completed/Met Goal: Will remain free from infection Outcome: Completed/Met Goal: Diagnostic test results will improve Outcome: Completed/Met Goal: Respiratory complications will improve Outcome: Completed/Met Goal: Cardiovascular complication will be avoided Outcome: Completed/Met   Problem: Activity: Goal: Risk for activity intolerance will decrease Outcome: Completed/Met   Problem: Nutrition: Goal: Adequate nutrition will be maintained Outcome: Completed/Met   Problem: Coping: Goal: Level of anxiety will decrease Outcome: Completed/Met   Problem: Elimination: Goal: Will not experience complications related to bowel motility Outcome: Completed/Met Goal: Will not experience complications related to urinary retention Outcome: Completed/Met   Problem: Pain Managment: Goal: General experience of comfort will improve Outcome: Completed/Met   Problem: Safety: Goal: Ability to remain free from injury will improve Outcome: Completed/Met   Problem: Skin Integrity: Goal: Risk for impaired skin integrity will decrease Outcome: Completed/Met   Problem: Education: Goal: Knowledge of disease or condition will improve Outcome: Completed/Met Goal: Knowledge of secondary prevention will improve (SELECT ALL) Outcome: Completed/Met

## 2021-09-13 NOTE — Discharge Summary (Addendum)
Allen Dyer NUU:[id number] DOB: 27-Jan-1936 DOA: 09/11/2021  PCP: [name], MD  Admit date: 09/11/2021  Discharge date: 09/13/2021  Admitted From: Home   Disposition:  Home   Recommendations for Outpatient Follow-up:   Follow up with PCP in 1-2 weeks  PCP Please obtain BMP/CBC, 2 view CXR in 1week,  (see Discharge instructions)   PCP Please follow up on the following pending results: Monitor secondary risk factors for stroke, needs close outpatient follow-up with neurology postdischarge.   Home Health: PT,OT  Equipment/Devices: None  Consultations: Neuro Discharge Condition: Stable    CODE STATUS: Full    Diet Recommendation: Heart Healthy Low Carb   Chief Complaint  Patient presents with   Cerebrovascular Accident     Brief history of present illness from the day of admission and additional interim summary    86 y.o. male with medical history significant of chronic combined CHF; stage 4 CKD; HTN; HLD; multiple myeloma; prostate CA s/p XRT; and DM presenting with some dizziness and blurriness in vision work-up positive for stroke.                                                                 Hospital Course   Dizziness and blurriness in the right eye.  MRI positive for multiple acute to subacute bilateral cerebellar infarcts in the AICA and some mild distal right PCA and also left thalamostriate artery ischemia - he undergo full stroke work-up, echocardiogram and MRA neck pending, case discussed with neurologist Dr. Leonie Man on 09/13/2021, for now dual antiplatelet therapy for 3 months along with in Zetia as he has statin allergy, LDL was slightly above goal, A1c was stable.  Seen by PT OT will get outpatient PT OT if he qualifies, will be discharged home with PCP follow-up along with follow-up with  neurology within  2 to 3 weeks.  Is now feeling much better and wants to go home. Note stop Plavix after 3 months and continue aspirin and Zetia, consider starting on statin if deemed safe by PCP.  2.  Dyslipidemia.  On  Zetia for better control.  Has listed allergy to statin with hives.   3.  Hypertension.  Allow for permissive hypertension for now.  Blood pressure medications will be resumed in a graduated fashion as below   4.  Bilateral vertebral artery disease.  MRI and vascular carotid duplex noted Case discussed with Dr. Leonie Man, no further changes except as for #1 above.   5.  Combined chronic systolic and diastolic heart failure with EF 35%.  Repeat echo table with slightly improved EF of around 40%, follows with Dr. Aundra Dubin, currently compensated continue low-dose Imdur.  Resume Coreg and hydralazine in the next 48 hours once he recovers from acute stroke phase, home medications increased in a graduated fashion as  below.   6.  CKD stage IV.  With a baseline Creatinine of around 2.5.  Monitor.   7.  Obesity.  BMI now 29, continue home regimen.   8.  History of multiple myeloma.  No acute issues follows with Dr. Alvy Bimler.   9.  DM type II.  A1c satisfactory.  Home regimen to continue, PCP to monitor closely as he has underlying CKD 4.   Discharge diagnosis     Principal Problem:   Acute CVA (cerebrovascular accident) [facility]) Active Problems:   Chronic kidney disease (CKD), stage IV (severe) (HCC)   Hyperlipidemia   Chronic combined systolic (congestive) and diastolic (congestive) heart failure (HCC)   Essential hypertension   Type 2 diabetes mellitus with vascular disease ([place])   Smoldering multiple myeloma   DNR (do not resuscitate)    Discharge instructions    Discharge Instructions     Discharge instructions   Complete by: As directed    Follow with Primary MD Koirala, Dibas, MD in 7 days   Get CBC, CMP, 2 view Chest X ray -  checked next visit within 1 week by Primary  MD    Activity: As tolerated with Full fall precautions use walker/cane & assistance as needed  Disposition Home   Diet: Heart Healthy Low Carb  Special Instructions: If you have smoked or chewed Tobacco  in the last 2 yrs please stop smoking, stop any regular Alcohol  and or any Recreational drug use.  On your next visit with your primary care physician please Get Medicines reviewed and adjusted.  Please request your Prim.MD to go over all Hospital Tests and Procedure/Radiological results at the follow up, please get all Hospital records sent to your Prim MD by signing hospital release before you go home.  If you experience worsening of your admission symptoms, develop shortness of breath, life threatening emergency, suicidal or homicidal thoughts you must seek medical attention immediately by calling 911 or calling your MD immediately  if symptoms less severe.  You Must read complete instructions/literature along with all the possible adverse reactions/side effects for all the Medicines you take and that have been prescribed to you. Take any new Medicines after you have completely understood and accpet all the possible adverse reactions/side effects.   Increase activity slowly   Complete by: As directed        Discharge Medications   Allergies as of 09/13/2021       Reactions   Tramadol Other (See Comments)   Dizziness (intolerance) hallucinate   Finasteride Swelling   Breast enlargement   Lisinopril Cough   Ciprofloxacin Other (See Comments)   Dizziness (intolerance)   Simvastatin Hives, Other (See Comments)   Blisters/ Rash   Sulfa Antibiotics Rash        Medication List     TAKE these medications    allopurinol 100 MG tablet Commonly known as: ZYLOPRIM Take 50 mg by mouth every morning.   aspirin 81 MG chewable tablet Chew 1 tablet (81 mg total) by mouth daily. Start taking on: Sep 14, 2021   carvedilol 6.25 MG tablet Commonly known as: COREG Take 1.5  tablets (9.375 mg total) by mouth 2 (two) times daily with a meal.   clopidogrel 75 MG tablet Commonly known as: PLAVIX Take 1 tablet (75 mg total) by mouth daily. Start taking on: Sep 14, 2021   eplerenone 50 MG tablet Commonly known as: INSPRA Take 1 tablet (50 mg total) by mouth daily. What changed: when to  take this   ezetimibe 10 MG tablet Commonly known as: ZETIA Take 1 tablet (10 mg total) by mouth daily. Start taking on: Sep 14, 2021   FREESTYLE LITE test strip Generic drug: glucose blood 1 each by Other route daily in the afternoon. Use as instructed   FreeStyle Lite w/Device Kit 1 Device by Does not apply route daily in the afternoon.   gabapentin 600 MG tablet Commonly known as: NEURONTIN Take 300 mg by mouth every morning.   Garlic 4098 MG Caps Take 1,000 mg by mouth every morning.   glipiZIDE 5 MG tablet Commonly known as: GLUCOTROL Take 1 tablet (5 mg total) by mouth daily before breakfast AND 0.5 tablets (2.5 mg total) daily before supper.   hydrALAZINE 50 MG tablet Commonly known as: APRESOLINE Take 1 tablet (50 mg total) by mouth 3 (three) times daily. 6am, 2pm and 10pm Start taking on: Sep 15, 2021 What changed:  See the new instructions. These instructions start on Sep 15, 2021. If you are unsure what to do until then, ask your doctor or other care provider.   isosorbide mononitrate 30 MG 24 hr tablet Commonly known as: IMDUR Take 1 tablet (30 mg total) by mouth every morning. Start taking on: Sep 14, 2021   multivitamin with minerals Tabs tablet Take 1 tablet by mouth every morning.   pantoprazole 40 MG tablet Commonly known as: PROTONIX Take 1 tablet (40 mg total) by mouth daily. Start taking on: Sep 14, 2021   potassium chloride SA 20 MEQ tablet Commonly known as: KLOR-CON M Take 1 tablet (20 mEq total) by mouth daily. What changed: when to take this   Rybelsus 14 MG Tabs Generic drug: Semaglutide Take 14 mg by mouth daily. What  changed: when to take this   tamsulosin 0.4 MG Caps capsule Commonly known as: FLOMAX Take 0.4 mg by mouth 2 (two) times daily.   torsemide 20 MG tablet Commonly known as: DEMADEX Take 2 tablets (40 mg total) by mouth 2 (two) times daily.   vitamin B-12 500 MCG tablet Commonly known as: CYANOCOBALAMIN Take 500 mcg by mouth every morning.   vitamin C 1000 MG tablet Take 4,000 mg by mouth every morning.   VITAMIN D3 PO Take 1 capsule by mouth every morning.         Follow-up Information     Koirala, Dibas, MD. Schedule an appointment as soon as possible for a visit in 1 week(s).   Specialty: Family Medicine Contact information: [place] [address] [phone]         [name], MD .   Specialty: Cardiology Contact information: [phone] [address] [place] [address] [phone]         [place] Schedule an appointment as soon as possible for a visit in 1 week(s).   Contact information: [address]     [place] [address] [phone]                Major procedures and Radiology Reports - PLEASE review detailed and final reports thoroughly  -        MR ANGIO HEAD WO CONTRAST  Result Date: 09/11/2021 CLINICAL DATA:  Stroke, follow up EXAM: MRA HEAD WITHOUT CONTRAST TECHNIQUE: Angiographic images of the Circle of Willis were acquired using MRA technique without intravenous contrast. COMPARISON:  None Available. FINDINGS: Anterior circulation: Intracranial internal carotid arteries are patent with atherosclerotic irregularity. There  is up to moderate stenosis of the cavernous portions. Moderate stenosis of the left supraclinoid portion. Anterior cerebral arteries are patent. 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 Middle cerebral arteries are patent. Posterior circulation: Intracranial left vertebral artery is patent. Severe stenosis of the  distal left vertebral artery. There is flow related enhancement only of the distal right intracranial vertebral artery, which may reflect retrograde flow. Basilar artery is patent with atherosclerotic irregularity and up to moderate stenosis. Patent left PICA and AICA identified. Superior cerebellar artery origins are patent. Posterior cerebral arteries are patent. There is diffuse narrowing of the left P2 PCA. IMPRESSION: Intracranial right vertebral artery shows minimal flow only distally, which is probably retrograde. High-grade stenosis of the distal left vertebral artery. Atherosclerosis of the basilar with up to moderate stenosis. Bilateral intracranial ICA atherosclerosis with up to moderate stenosis. Electronically Signed   By: Macy Mis M.D.   On: 09/11/2021 15:34   MR ANGIO NECK WO CONTRAST  Result Date: 09/13/2021 CLINICAL DATA:  Stroke follow-up EXAM: MRA NECK WITHOUT CONTRAST TECHNIQUE: Angiographic images of the neck were acquired using MRA technique without intravenous contrast. Carotid stenosis measurements (when applicable) are obtained utilizing NASCET criteria, using the distal internal carotid diameter as the denominator. COMPARISON:  Brain MRI and MRA from 2 days ago. FINDINGS: Aortic arch: Minimal coverage is negative. Right carotid system: Tortuous ICA without flow limiting stenosis or dissection flap. Some dilatation of the distal ICA without pseudoaneurysm. Left carotid system: Tortuous ICA with dephasing. No evidence of stenosis or ulceration Vertebral arteries: No proximal subclavian stenosis. No flow seen is seen within the right vertebral artery throughout its course. Robust flow seen in the left vertebral artery until the dural penetration. There is a high-grade distal left vertebral artery stenosis by preceding intracranial MRA. IMPRESSION: 1. No flow seen in the right vertebral artery throughout the neck. 2. Diminished flow in the partially covered left V4 segment associated  with a severe stenosis seen by prior intracranial MRA. 3. No flow reducing stenosis or ulceration seen in the tortuous cervical carotids. Electronically Signed   By: Jorje Guild M.D.   On: 09/13/2021 10:26   MR BRAIN WO CONTRAST  Result Date: 09/11/2021 CLINICAL DATA:  86 year old male with recent dizziness, facial numbness. EXAM: MRI HEAD WITHOUT CONTRAST TECHNIQUE: Multiplanar, multiecho pulse sequences of the brain and surrounding structures were obtained without intravenous contrast. COMPARISON:  Head CT 08/04/2021. FINDINGS: Brain: Multifocal bilateral cerebellar hemisphere restricted diffusion with T2 and FLAIR hyperintensity compatible with multiple acute to subacute mostly AICA territory infarcts. No associated hemorrhage or mass effect. Additional more subtle restricted diffusion in the right occipital pole series 3, image 79, also with mild T2 and FLAIR hyperintensity. And questionable superimposed abnormal diffusion in the superior left thalamus, punctate on series 3, image 84. No other restricted diffusion. No midline shift, mass effect, evidence of mass lesion, ventriculomegaly, extra-axial collection or acute intracranial hemorrhage. Cervicomedullary junction and pituitary are within normal limits. Background gray and white matter signal is within normal limits for age. No chronic cortical encephalomalacia or chronic cerebral blood products identified. Outside of the left thalamus the deep gray matter nuclei appear normal. There is chronic lacunar infarction in the left paracentral pons (series 11, image 48). Vascular: Abnormal distal right vertebral artery flow void in the upper neck and at the skull base (series 7, images 1-4. Other Major intracranial vascular flow voids are preserved. Skull and upper cervical spine: Negative. Visualized bone marrow signal is within normal  limits. Sinuses/Orbits: Postoperative changes to both globes. Minor paranasal sinus mucosal thickening. Other: Visible  internal auditory structures appear normal. Trace mastoid air cell fluid. Negative visible scalp and face. IMPRESSION: 1. Multiple acute to subacute bilateral cerebellar and AICA territory infarcts. Superimposed mild distal right PCA and possible also left thalamostriate artery ischemia. No associated hemorrhage or mass effect. 2. Poor flow or occlusion of the distal right vertebral artery in the upper neck and at the skull base. CTA head and neck would best evaluate further. 3. Chronic lacunar infarction in the left pons. #1 and #2 were discussed with the patient's daughter at 1016 hours. And I advised transfer to [facility] Emergency Department for further Neurologic evaluation and treatment. She understood and was agreeable. Electronically Signed   By: Genevie Ann M.D.   On: 09/11/2021 10:30   ECHOCARDIOGRAM COMPLETE  Result Date: 09/12/2021    ECHOCARDIOGRAM REPORT   Patient Name:   Allen Dyer Date of Exam: 09/12/2021 Medical Rec #:  [id number]     Height:       67.5 in Accession #:    [id number]    Weight:       193.3 lb Date of Birth:  [birth year]     BSA:          2.004 m Patient Age:    35 years      BP:           139/86 mmHg Patient Gender: M             HR:           61 bpm. Exam Location:  Inpatient Procedure: 2D Echo, Cardiac Doppler and Color Doppler Indications:    Stroke  History:        Patient has prior history of Echocardiogram examinations, most                 recent 07/05/2021. Cardiomyopathy and CHF, CAD; Risk                 Factors:Hypertension, Dyslipidemia and Diabetes. CKD.  Sonographer:    Clayton Lefort RDCS (AE) Referring Phys: [place]  1. Left ventricular ejection fraction, by estimation, is 35 to 40%. The left ventricle has moderately decreased function. The left ventricle demonstrates global hypokinesis. There is mild concentric left ventricular hypertrophy. Left ventricular diastolic parameters are consistent with Grade I diastolic dysfunction (impaired  relaxation).  2. Right ventricular systolic function is normal. The right ventricular size is normal.  3. Left atrial size was mildly dilated.  4. The mitral valve is grossly normal. Mild mitral valve regurgitation.  5. The aortic valve is tricuspid. Aortic valve regurgitation is mild. Aortic valve sclerosis/calcification is present, without any evidence of aortic stenosis.  6. Aortic dilatation noted. There is mild dilatation of the ascending aorta, measuring 45 mm.  7. The inferior vena cava is normal in size with greater than 50% respiratory variability, suggesting right atrial pressure of 3 mmHg. Comparison(s): Compared to TTE on 06/2021, the LVEF appears slightly better at 35-40% (previously 30-35%). Conclusion(s)/Recommendation(s): No intracardiac source of embolism detected on this transthoracic study. Consider a transesophageal echocardiogram to exclude cardiac source of embolism if clinically indicated. FINDINGS  Left Ventricle: Left ventricular ejection fraction, by estimation, is 35 to 40%. The left ventricle has moderately decreased function. The left ventricle demonstrates global hypokinesis. The left ventricular internal cavity size was normal in size. There is mild concentric left ventricular hypertrophy. Left ventricular diastolic parameters  are consistent with Grade I diastolic dysfunction (impaired relaxation). Right Ventricle: The right ventricular size is normal. No increase in right ventricular wall thickness. Right ventricular systolic function is normal. Left Atrium: Left atrial size was mildly dilated. Right Atrium: Right atrial size was normal in size. Pericardium: There is no evidence of pericardial effusion. Mitral Valve: The mitral valve is grossly normal. There is mild thickening of the mitral valve leaflet(s). There is mild calcification of the mitral valve leaflet(s). Mild mitral annular calcification. Mild mitral valve regurgitation. MV peak gradient, 4.2 mmHg. The mean mitral valve  gradient is 2.0 mmHg. Tricuspid Valve: The tricuspid valve is normal in structure. Tricuspid valve regurgitation is trivial. Aortic Valve: The aortic valve is tricuspid. Aortic valve regurgitation is mild. Aortic valve sclerosis/calcification is present, without any evidence of aortic stenosis. Aortic valve mean gradient measures 3.0 mmHg. Aortic valve peak gradient measures 5.6 mmHg. Aortic valve area, by VTI measures 2.82 cm. Pulmonic Valve: The pulmonic valve was not well visualized. Aorta: Aortic dilatation noted. There is mild dilatation of the ascending aorta, measuring 45 mm. Venous: The inferior vena cava is normal in size with greater than 50% respiratory variability, suggesting right atrial pressure of 3 mmHg. IAS/Shunts: The atrial septum is grossly normal.  LEFT VENTRICLE PLAX 2D LVIDd:         5.70 cm      Diastology LVIDs:         4.80 cm      LV e' medial:    3.92 cm/s LV PW:         1.30 cm      LV E/e' medial:  15.3 LV IVS:        1.40 cm      LV e' lateral:   3.37 cm/s LVOT diam:     2.40 cm      LV E/e' lateral: 17.8 LV SV:         70 LV SV Index:   35 LVOT Area:     4.52 cm  LV Volumes (MOD) LV vol d, MOD A2C: 163.0 ml LV vol d, MOD A4C: 104.0 ml LV vol s, MOD A2C: 99.1 ml LV vol s, MOD A4C: 69.2 ml LV SV MOD A2C:     63.9 ml LV SV MOD A4C:     104.0 ml LV SV MOD BP:      46.3 ml RIGHT VENTRICLE             IVC RV Basal diam:  2.20 cm     IVC diam: 1.50 cm RV S prime:     11.50 cm/s LEFT ATRIUM             Index        RIGHT ATRIUM           Index LA diam:        2.80 cm 1.40 cm/m   RA Area:     15.50 cm LA Vol (A2C):   43.4 ml 21.65 ml/m  RA Volume:   33.70 ml  16.81 ml/m LA Vol (A4C):   54.1 ml 26.99 ml/m LA Biplane Vol: 51.1 ml 25.49 ml/m  AORTIC VALVE AV Area (Vmax):    2.91 cm AV Area (Vmean):   2.89 cm AV Area (VTI):     2.82 cm AV Vmax:           118.00 cm/s AV Vmean:          76.600 cm/s AV VTI:  0.247 m AV Peak Grad:      5.6 mmHg AV Mean Grad:      3.0 mmHg LVOT  Vmax:         75.80 cm/s LVOT Vmean:        49.000 cm/s LVOT VTI:          0.154 m LVOT/AV VTI ratio: 0.62  AORTA Ao Root diam: 3.40 cm Ao Asc diam:  4.50 cm MITRAL VALVE MV Area (PHT): 3.53 cm    SHUNTS MV Area VTI:   3.30 cm    Systemic VTI:  0.15 m MV Peak grad:  4.2 mmHg    Systemic Diam: 2.40 cm MV Mean grad:  2.0 mmHg MV Vmax:       1.02 m/s MV Vmean:      57.3 cm/s MV Decel Time: 215 msec MV E velocity: 60.10 cm/s MV A velocity: 82.90 cm/s MV E/A ratio:  0.72 Gwyndolyn Kaufman MD Electronically signed by Gwyndolyn Kaufman MD Signature Date/Time: 09/12/2021/4:22:01 PM    Final    VAS US CAROTID  Result Date: 09/12/2021 Carotid Arterial Duplex Study Patient Name:  Allen Dyer  Date of Exam:   09/12/2021 Medical Rec #: [id number]      Accession #:    [id number] Date of Birth: 1936-03-09      Patient Gender: M Patient Age:   60 years Exam Location:  [facility] Procedure:      VAS US CAROTID Referring Phys: ERIC LINDZEN --------------------------------------------------------------------------------  Indications:      CVA, Speech disturbance and Dizziness. Risk Factors:     Hypertension, hyperlipidemia, Diabetes, coronary artery                   disease. Other Factors:    CHF, pulmonary venous Hypertension, CKD III. Comparison Study: Prior normal carotid duplex done 06/03/19 Performing Technologist: Sharion Dove RVS  Examination Guidelines: A complete evaluation includes B-mode imaging, spectral Doppler, color Doppler, and power Doppler as needed of all accessible portions of each vessel. Bilateral testing is considered an integral part of a complete examination. Limited examinations for reoccurring indications may be performed as noted.  Right Carotid Findings: +----------+--------+--------+--------+------------------+------------------+           PSV cm/sEDV cm/sStenosisPlaque DescriptionComments           +----------+--------+--------+--------+------------------+------------------+ CCA  Prox  138     24                                intimal thickening +----------+--------+--------+--------+------------------+------------------+ CCA Distal104     20                                intimal thickening +----------+--------+--------+--------+------------------+------------------+ ICA Prox  101     24              heterogenous                         +----------+--------+--------+--------+------------------+------------------+ ICA Distal51      27                                tortuous           +----------+--------+--------+--------+------------------+------------------+ ECA       38      6  tortuous           +----------+--------+--------+--------+------------------+------------------+ +----------+--------+-------+--------+-------------------+           PSV cm/sEDV cmsDescribeArm Pressure (mmHG) +----------+--------+-------+--------+-------------------+ [id number]                                         +----------+--------+-------+--------+-------------------+ +---------+--------+--+--------+--+---------+ VertebralPSV cm/s90EDV cm/s24Antegrade +---------+--------+--+--------+--+---------+  Left Carotid Findings: +----------+--------+--------+--------+------------------+------------------+           PSV cm/sEDV cm/sStenosisPlaque DescriptionComments           +----------+--------+--------+--------+------------------+------------------+ CCA Prox  119     13                                intimal thickening +----------+--------+--------+--------+------------------+------------------+ CCA Distal67      14                                intimal thickening +----------+--------+--------+--------+------------------+------------------+ ICA Prox  46      17              heterogenous                         +----------+--------+--------+--------+------------------+------------------+ ICA Mid   94                                                            +----------+--------+--------+--------+------------------+------------------+ ICA Distal61      24                                tortuous           +----------+--------+--------+--------+------------------+------------------+ ECA       92      6                                 tortuous           +----------+--------+--------+--------+------------------+------------------+ +----------+--------+--------+--------+-------------------+           PSV cm/sEDV cm/sDescribeArm Pressure (mmHG) +----------+--------+--------+--------+-------------------+ [id number]                                          +----------+--------+--------+--------+-------------------+ +---------+--------+--+--------+--+---------+ VertebralPSV cm/s32EDV cm/s12Antegrade +---------+--------+--+--------+--+---------+   Summary: Right Carotid: The extracranial vessels were near-normal with only minimal wall                thickening or plaque. Left Carotid: The extracranial vessels were near-normal with only minimal wall               thickening or plaque. Vertebrals:  Bilateral vertebral arteries demonstrate antegrade flow. Subclavians: Normal flow hemodynamics were seen in bilateral subclavian              arteries. *See table(s) above for measurements and observations.  Electronically signed by Antony Contras MD on 09/12/2021 at 11:19:59 AM.    Final       Today  Subjective    Allen Dyer today has no headache,no chest abdominal pain,no new weakness tingling or numbness, feels much better wants to go home today.     Objective   Blood pressure 130/78, pulse 88, temperature 98 F (36.7 C), temperature source Oral, resp. rate (!) 24, height 5' 7.5" (1.715 m), weight 87.7 kg, SpO2 96 %.   Intake/Output Summary (Last 24 hours) at 09/13/2021 1101 Last data filed at 09/12/2021 2200 Gross per 24 hour  Intake 120 ml  Output 300 ml  Net -180 ml     Exam  Awake Alert, No new F.N deficits,    [place].AT,PERRAL Supple Neck,   Symmetrical Chest wall movement, Good air movement bilaterally, CTAB RRR,No Gallops,   +ve B.Sounds, Abd Soft, Non tender,  No Cyanosis, Clubbing or edema    Data Review   Recent Labs  Lab 09/11/21 1040  WBC 5.2  HGB 11.3*  HCT 33.8*  PLT 143*  MCV 101.2*  MCH 33.8  MCHC 33.4  RDW 13.5  LYMPHSABS 1.0  MONOABS 0.6  EOSABS 0.3  BASOSABS 0.0    Recent Labs  Lab 09/11/21 1040 09/12/21 0212 09/13/21 0642  NA 139 141 143  K 3.7 3.7 4.3  CL 103 104 107  CO2 31 31 32  GLUCOSE 140* 110* 93  BUN 20 25* 23  CREATININE 2.32* 2.45* 2.14*  CALCIUM 9.1 9.3 9.2  AST 18  --   --   ALT 15  --   --   ALKPHOS 54  --   --   BILITOT 0.7  --   --   ALBUMIN 3.2*  --   --   INR 1.0  --   --   HGBA1C  --  6.7*  --    Lab Results  Component Value Date   CHOL 142 09/12/2021   HDL 35 (L) 09/12/2021   LDLCALC 79 09/12/2021   TRIG 139 09/12/2021   CHOLHDL 4.1 09/12/2021    Total Time in preparing paper work, data evaluation and todays exam - 84 minutes  Lala Lund M.D on 09/13/2021 at 11:01 AM  [facility]

## 2021-09-13 NOTE — Progress Notes (Addendum)
STROKE TEAM PROGRESS NOTE   INTERVAL HISTORY Patient is seen in his room with no family at the bedside.  He has had no acute events overnight, and his vital signs are stable.  His neurological exam is stable.  MRA neck reveals no flow in right vertebral artery and severe stenosis of terminal left vertebral artery.  Patient wants to participate in the [place] study but unfortunately is excluded due to his age as it is about 86.  Vitals:   09/13/21 0000 09/13/21 0028 09/13/21 0430 09/13/21 0740  BP: (!) 144/86 (!) 144/86 130/78 130/78  Pulse: 74 81 68 88  Resp: 11 (!) 22 12 (!) 24  Temp: 98.5 F (36.9 C)  98.2 F (36.8 C) 98 F (36.7 C)  TempSrc: Oral  Oral Oral  SpO2: 95% 95% 96% 96%  Weight:      Height:       CBC:  Recent Labs  Lab 09/11/21 1040  WBC 5.2  NEUTROABS 3.2  HGB 11.3*  HCT 33.8*  MCV 101.2*  PLT 143*    Basic Metabolic Panel:  Recent Labs  Lab 09/12/21 0212 09/13/21 0642  NA 141 143  K 3.7 4.3  CL 104 107  CO2 31 32  GLUCOSE 110* 93  BUN 25* 23  CREATININE 2.45* 2.14*  CALCIUM 9.3 9.2    Lipid Panel:  Recent Labs  Lab 09/12/21 0212  CHOL 142  TRIG 139  HDL 35*  CHOLHDL 4.1  VLDL 28  LDLCALC 79    HgbA1c:  Recent Labs  Lab 09/12/21 0212  HGBA1C 6.7*    Urine Drug Screen:  Recent Labs  Lab 09/11/21 1225  LABOPIA NONE DETECTED  COCAINSCRNUR NONE DETECTED  LABBENZ NONE DETECTED  AMPHETMU NONE DETECTED  THCU NONE DETECTED  LABBARB NONE DETECTED     Alcohol Level No results for input(s): ETH in the last 168 hours.  IMAGING past 24 hours MR ANGIO NECK WO CONTRAST  Result Date: 09/13/2021 CLINICAL DATA:  Stroke follow-up EXAM: MRA NECK WITHOUT CONTRAST TECHNIQUE: Angiographic images of the neck were acquired using MRA technique without intravenous contrast. Carotid stenosis measurements (when applicable) are obtained utilizing NASCET criteria, using the distal internal carotid diameter as the denominator. COMPARISON:  Brain MRI  and MRA from 2 days ago. FINDINGS: Aortic arch: Minimal coverage is negative. Right carotid system: Tortuous ICA without flow limiting stenosis or dissection flap. Some dilatation of the distal ICA without pseudoaneurysm. Left carotid system: Tortuous ICA with dephasing. No evidence of stenosis or ulceration Vertebral arteries: No proximal subclavian stenosis. No flow seen is seen within the right vertebral artery throughout its course. Robust flow seen in the left vertebral artery until the dural penetration. There is a high-grade distal left vertebral artery stenosis by preceding intracranial MRA. IMPRESSION: 1. No flow seen in the right vertebral artery throughout the neck. 2. Diminished flow in the partially covered left V4 segment associated with a severe stenosis seen by prior intracranial MRA. 3. No flow reducing stenosis or ulceration seen in the tortuous cervical carotids. Electronically Signed   By: Jorje Guild M.D.   On: 09/13/2021 10:26   ECHOCARDIOGRAM COMPLETE  Result Date: 09/12/2021    ECHOCARDIOGRAM REPORT   Patient Name:   Allen Dyer Date of Exam: 09/12/2021 Medical Rec #:  [id number]     Height:       67.5 in Accession #:    [id number]    Weight:       193.3 lb Date of  Birth:  04-Jan-1936     BSA:          2.004 m Patient Age:    86 years      BP:           139/86 mmHg Patient Gender: M             HR:           61 bpm. Exam Location:  Inpatient Procedure: 2D Echo, Cardiac Doppler and Color Doppler Indications:    Stroke  History:        Patient has prior history of Echocardiogram examinations, most                 recent 07/05/2021. Cardiomyopathy and CHF, CAD; Risk                 Factors:Hypertension, Dyslipidemia and Diabetes. CKD.  Sonographer:    Clayton Lefort RDCS (AE) Referring Phys: [place]  1. Left ventricular ejection fraction, by estimation, is 35 to 40%. The left ventricle has moderately decreased function. The left ventricle demonstrates global  hypokinesis. There is mild concentric left ventricular hypertrophy. Left ventricular diastolic parameters are consistent with Grade I diastolic dysfunction (impaired relaxation).  2. Right ventricular systolic function is normal. The right ventricular size is normal.  3. Left atrial size was mildly dilated.  4. The mitral valve is grossly normal. Mild mitral valve regurgitation.  5. The aortic valve is tricuspid. Aortic valve regurgitation is mild. Aortic valve sclerosis/calcification is present, without any evidence of aortic stenosis.  6. Aortic dilatation noted. There is mild dilatation of the ascending aorta, measuring 45 mm.  7. The inferior vena cava is normal in size with greater than 50% respiratory variability, suggesting right atrial pressure of 3 mmHg. Comparison(s): Compared to TTE on 06/2021, the LVEF appears slightly better at 35-40% (previously 30-35%). Conclusion(s)/Recommendation(s): No intracardiac source of embolism detected on this transthoracic study. Consider a transesophageal echocardiogram to exclude cardiac source of embolism if clinically indicated. FINDINGS  Left Ventricle: Left ventricular ejection fraction, by estimation, is 35 to 40%. The left ventricle has moderately decreased function. The left ventricle demonstrates global hypokinesis. The left ventricular internal cavity size was normal in size. There is mild concentric left ventricular hypertrophy. Left ventricular diastolic parameters are consistent with Grade I diastolic dysfunction (impaired relaxation). Right Ventricle: The right ventricular size is normal. No increase in right ventricular wall thickness. Right ventricular systolic function is normal. Left Atrium: Left atrial size was mildly dilated. Right Atrium: Right atrial size was normal in size. Pericardium: There is no evidence of pericardial effusion. Mitral Valve: The mitral valve is grossly normal. There is mild thickening of the mitral valve leaflet(s). There is mild  calcification of the mitral valve leaflet(s). Mild mitral annular calcification. Mild mitral valve regurgitation. MV peak gradient, 4.2 mmHg. The mean mitral valve gradient is 2.0 mmHg. Tricuspid Valve: The tricuspid valve is normal in structure. Tricuspid valve regurgitation is trivial. Aortic Valve: The aortic valve is tricuspid. Aortic valve regurgitation is mild. Aortic valve sclerosis/calcification is present, without any evidence of aortic stenosis. Aortic valve mean gradient measures 3.0 mmHg. Aortic valve peak gradient measures 5.6 mmHg. Aortic valve area, by VTI measures 2.82 cm. Pulmonic Valve: The pulmonic valve was not well visualized. Aorta: Aortic dilatation noted. There is mild dilatation of the ascending aorta, measuring 45 mm. Venous: The inferior vena cava is normal in size with greater than 50% respiratory variability, suggesting right atrial pressure of  3 mmHg. IAS/Shunts: The atrial septum is grossly normal.  LEFT VENTRICLE PLAX 2D LVIDd:         5.70 cm      Diastology LVIDs:         4.80 cm      LV e' medial:    3.92 cm/s LV PW:         1.30 cm      LV E/e' medial:  15.3 LV IVS:        1.40 cm      LV e' lateral:   3.37 cm/s LVOT diam:     2.40 cm      LV E/e' lateral: 17.8 LV SV:         70 LV SV Index:   35 LVOT Area:     4.52 cm  LV Volumes (MOD) LV vol d, MOD A2C: 163.0 ml LV vol d, MOD A4C: 104.0 ml LV vol s, MOD A2C: 99.1 ml LV vol s, MOD A4C: 69.2 ml LV SV MOD A2C:     63.9 ml LV SV MOD A4C:     104.0 ml LV SV MOD BP:      46.3 ml RIGHT VENTRICLE             IVC RV Basal diam:  2.20 cm     IVC diam: 1.50 cm RV S prime:     11.50 cm/s LEFT ATRIUM             Index        RIGHT ATRIUM           Index LA diam:        2.80 cm 1.40 cm/m   RA Area:     15.50 cm LA Vol (A2C):   43.4 ml 21.65 ml/m  RA Volume:   33.70 ml  16.81 ml/m LA Vol (A4C):   54.1 ml 26.99 ml/m LA Biplane Vol: 51.1 ml 25.49 ml/m  AORTIC VALVE AV Area (Vmax):    2.91 cm AV Area (Vmean):   2.89 cm AV Area (VTI):      2.82 cm AV Vmax:           118.00 cm/s AV Vmean:          76.600 cm/s AV VTI:            0.247 m AV Peak Grad:      5.6 mmHg AV Mean Grad:      3.0 mmHg LVOT Vmax:         75.80 cm/s LVOT Vmean:        49.000 cm/s LVOT VTI:          0.154 m LVOT/AV VTI ratio: 0.62  AORTA Ao Root diam: 3.40 cm Ao Asc diam:  4.50 cm MITRAL VALVE MV Area (PHT): 3.53 cm    SHUNTS MV Area VTI:   3.30 cm    Systemic VTI:  0.15 m MV Peak grad:  4.2 mmHg    Systemic Diam: 2.40 cm MV Mean grad:  2.0 mmHg MV Vmax:       1.02 m/s MV Vmean:      57.3 cm/s MV Decel Time: 215 msec MV E velocity: 60.10 cm/s MV A velocity: 82.90 cm/s MV E/A ratio:  0.72 Gwyndolyn Kaufman MD Electronically signed by Gwyndolyn Kaufman MD Signature Date/Time: 09/12/2021/4:22:01 PM    Final     PHYSICAL EXAM General:  Alert, well-developed, well-nourished patient in no acute distress Respiratory:  Regular, unlabored  respirations on room air  NEURO:  Mental Status: AA&Ox3  Speech/Language: speech is without dysarthria or aphasia.  Fluency, and comprehension intact.  Cranial Nerves:  II: PERRL. Visual fields full.  III, IV, VI: EOMI. Eyelids elevate symmetrically.  V: Sensation is intact to light touch and symmetrical to face.  VII: Smile is symmetrical.  VIII: hearing intact to voice. IX, X: Phonation is normal.  XII: tongue is midline without fasciculations. Motor: 5/5 strength to all muscle groups tested.  Tone: is normal and bulk is normal Sensation- Intact to light touch bilaterally.   Coordination: FTN intact bilaterally, HKS: no ataxia in BLE.  Gait- deferred   ASSESSMENT/PLAN [name] is a 86 y.o. male with history of DM, HTN, HLD, CKD III, CHF with EF 30-35%, pulmonary hypertension and CAD presenting with dizziness that had been present for about three days.  Outpatient MRI reveled bilateral subacute cerebellar strokes, and patient was directed to come to the hospital  Stroke:  bilateral cerebellar infarct likely secondary  due to high-grade stenosis  of left vertebral artery MRI  multiple acute to subacute bilateral cerebellar and AICA territory infarcts, distal right PCA and left thalamostriate artery ischemia, chronic lacunar infarct in left pons MRA  intracranial right vertebral artery with minimal flow distally, probably retrograde, moderate stenosis of basilar tip, bilateral intracranial ICA stenosis MRA neck No flow seen in right vertebral artery, severe stenosis of left V4 segment of left vertebral artery Carotid Doppler  minimal thickening or plaque in bilateral carotid arteries 2D Echo EF 63-84%, grade 1 diastolic dysfunction, mildly dilated left atrium, normal atrial septum LDL 79 HgbA1c 6.7 VTE prophylaxis - lovenox    Diet   Diet heart healthy/carb modified Room service appropriate? Yes; Fluid consistency: Thin   No antithrombotic prior to admission, now on aspirin 81 mg daily and clopidogrel 75 mg daily.  For 3 months and then aspirin alone Therapy recommendations:  outpatient PT Disposition:  pending, likely home  Hypertension Home meds:  none Stable Permissive hypertension (OK if < 220/120) but gradually normalize in 5-7 days Long-term BP goal normotensive  Hyperlipidemia Home meds:  none LDL 79, goal < 70 Add zetia 10 mg daily  High intensity statin not indicated due to previous intolerance Continue statin at discharge  Diabetes type II Controlled Home meds:  semaglutide 14 mg PO daily HgbA1c 6.7, goal < 7.0 CBGs Recent Labs    09/12/21 1639 09/12/21 2106 09/13/21 0742  GLUCAP 91 102* 93     SSI  Other Stroke Risk Factors Advanced Age >/= 46  Former cigarette smoker Hx stroke Coronary artery disease Congestive heart failure  Other Active Problems none  Hospital day # [place] , MSN, AGACNP-BC [facility] See Amion for schedule and pager information 09/13/2021 11:35 AM  I have personally obtained history,examined this patient,  reviewed notes, independently viewed imaging studies, participated in medical decision making and plan of care.ROS completed by me personally and pertinent positives fully documented  I have made any additions or clarifications directly to the above note. Agree with note above.  MRI of the neck shows right vertebral artery occlusion in the neck which is likely chronic and high-grade stenosis of terminal duct and left vertebral artery is still symptomatic.  Recommend aspirin and Plavix for 3 months followed by aspirin alone and aggressive risk factor modification.  Patient is interested in participating in the [place] stroke prevention study unfortunately he does not qualify as his age is  greater than 80.  Follow-up as an outpatient with stroke clinic in 2 months.  Stroke team will sign off.  Discussed with Dr. Candiss Norse.  Greater than 50% time during this 35-minute visit was spent in counseling and coordination of care about his stroke and the current stenosis was answering questions  Antony Contras, MD Medical Director [place] Pager: [phone] 09/13/2021 1:35 PM   To contact Stroke Continuity provider, please refer to [email or web address] After hours, contact General Neurology

## 2021-09-13 NOTE — Patient Outreach (Signed)
[facility]) Care Management  09/13/2021  Eliyahu Bille [birth year] [id number]   Telephone Assessment-inpt [facility])  RN briefly spoke with pt via his cell contact who indicated he was admitted on Sunday (dx CVA) and remains inpt. RN informed pt that RN case manager via [facility] will follow up with his later this week as pt indicated he would be discharged soon and go home.   RN will requested Valente David, RN case manager covering this RN to follow up with pt this week for ongoing [facility] services and address any anticipated needs. Pt aware to expect an outreach.  Raina Mina, RN Care Management Coordinator [place] Office [phone]

## 2021-09-13 NOTE — Consult Note (Signed)
   [facility] Inpatient Consult   09/13/2021  Tashan Kreitzer 1935-09-03 [id number]  [place]  Accountable Care Organization [ACO] Patient:   Primary Care Provider:  [name], MD is an [place] at [place] is an Embedded provider with a CCM program and is listed to provide the [facility] follow up  Patient is currently active with [facility] Management for chronic disease management services.  Patient has been engaged by a [facility] RN CM.  Our community based plan of care has focused on disease management and community resource support.    Plan:  Continue to follow for needs. Of note, [facility] Care Management services does not replace or interfere with any services that are needed or arranged by inpatient [facility] care management team.  For additional questions or referrals please contact:  Natividad Brood, RN BSN [place] Liaison  [phone] business mobile phone Toll free office [phone]  Fax number: [phone] [email or web address]

## 2021-09-13 NOTE — Progress Notes (Signed)
Inpatient Diabetes Program Recommendations  AACE/ADA: New Consensus Statement on Inpatient Glycemic Control (2015)  Target Ranges:  Prepandial:   less than 140 mg/dL      Peak postprandial:   less than 180 mg/dL (1-2 hours)      Critically ill patients:  140 - 180 mg/dL   Lab Results  Component Value Date   GLUCAP 93 09/13/2021   HGBA1C 6.7 (H) 09/12/2021    Review of Glycemic Control  Latest Reference Range & Units 09/13/21 06:42  GFR, Estimated >60 mL/min 30 (L)  (L): Data is abnormally low   Latest Reference Range & Units 09/12/21 08:15 09/12/21 11:45 09/12/21 12:09 09/12/21 16:39 09/12/21 21:06 09/13/21 07:42  Glucose-Capillary 70 - 99 mg/dL 202 (H) Novolog 3 units 61 (L) 84 91 102 (H) 93  (H): Data is abnormally high (L): Data is abnormally low  Current orders for Inpatient glycemic control: Novolog 0-9 units TID and 0-5 units QHS  Inpatient Diabetes Program Recommendations:    Novolog 0-6 units TID   Will continue to follow while inpatient.  Thank you, Reche Dixon, MSN, [place] Diabetes Coordinator Inpatient Diabetes Program [phone] (team pager from 8a-5p)

## 2021-09-14 ENCOUNTER — Other Ambulatory Visit: Payer: Self-pay | Admitting: *Deleted

## 2021-09-14 NOTE — Patient Outreach (Signed)
[facility]) Care Management Telephonic RN Care Manager Note   09/14/2021 Name:  Allen Dyer MRN:  [id number] DOB:  08/01/1935  Summary: Lahoma Crocker call placed to member, successful.  Denies any urgent concerns, encouraged to contact this care manager with questions.   Recommendations/Changes made from today's visit: Call cardiology and neurology to schedule follow up appointments.  Subjective: Allen Dyer is an 86 y.o. year old male who is a primary patient of Koirala, Dibas, MD. The care management team was consulted for assistance with care management and/or care coordination needs.    Telephonic RN Care Manager completed Telephone Visit today.  Objective:   Medications Reviewed Today     Reviewed by Payton Doughty, CPhT (Pharmacy Technician) on 09/11/21 at 1255  Med List Status: Complete   Medication Order Taking? Sig Documenting Provider Last Dose Status Informant  allopurinol (ZYLOPRIM) 100 MG tablet [id number] Yes Take 50 mg by mouth every morning. [provider] 09/11/2021 am Active Self  Ascorbic Acid (VITAMIN C) 1000 MG tablet [id number] No Take 4,000 mg by mouth every morning.  Patient not taking: Reported on 09/11/2021   [provider] Not Taking Active Self           Med Note Orvan Seen, Lenice Pressman Sep 11, 2021 12:40 PM) Last dose 09/08/21 - on hold until current symptoms have been resolved.  Blood Glucose Monitoring Suppl (FREESTYLE LITE) w/Device KIT [id number]  1 Device by Does not apply route daily in the afternoon. Shamleffer, Melanie Crazier, MD  Active Other  carvedilol (COREG) 6.25 MG tablet [id number] Yes Take 1.5 tablets (9.375 mg total) by mouth 2 (two) times daily with a meal. Rafael Bihari, [place] 09/11/2021 0800 Active Self, Pharmacy Records  Cholecalciferol (VITAMIN D3 PO) [id number] No Take 1 capsule by mouth every morning.  Patient not taking: Reported on 09/11/2021   [provider] Not Taking Active Self            Med Note Quinn Axe Sep 11, 2021 12:41 PM) Last dose 09/08/21 - on hold until current symptoms have been resolved.   eplerenone (INSPRA) 50 MG tablet [id number] Yes Take 1 tablet (50 mg total) by mouth daily.  Patient taking differently: Take 50 mg by mouth every morning.   Larey Dresser, MD 09/11/2021 Active Self, Pharmacy Records  gabapentin (NEURONTIN) 600 MG tablet [id number] Yes Take 300 mg by mouth every morning. [provider] 09/11/2021 Active Self  Garlic 9470 MG CAPS [id number] No Take 1,000 mg by mouth every morning.  Patient not taking: Reported on 09/11/2021   [provider] Not Taking Active Self           Med Note Quinn Axe Sep 11, 2021 12:42 PM) Last dose 09/08/21 - on hold until current symptoms have been resolved.   glipiZIDE (GLUCOTROL) 5 MG tablet [id number] Yes Take 1 tablet (5 mg total) by mouth daily before breakfast AND 0.5 tablets (2.5 mg total) daily before supper. Shamleffer, Melanie Crazier, MD 09/11/2021 am Active Self  glucose blood (FREESTYLE LITE) test strip [id number]  1 each by Other route daily in the afternoon. Use as instructed Shamleffer, Melanie Crazier, MD  Active Self, Pharmacy Records  hydrALAZINE (APRESOLINE) 50 MG tablet [id number] Yes TAKE 1 TABLET(50 MG) BY MOUTH THREE TIMES DAILY. PLEASE MAKE AN APPOINTMENT FOR FURTHER REFILLS  Patient taking differently: Take 50 mg by mouth 3 (three) times daily. 6am, 2pm and 10pm  Larey Dresser, MD 09/11/2021 am Active Self, Pharmacy Records  isosorbide mononitrate (IMDUR) 30 MG 24 hr tablet [id number] Yes Take 30 mg by mouth every morning. [provider] 09/11/2021 Active Self  Multiple Vitamin (MULTIVITAMIN WITH MINERALS) TABS tablet [id number] No Take 1 tablet by mouth every morning.  Patient not taking: Reported on 09/11/2021   [provider] Not Taking Active Self           Med Note Quinn Axe Sep 11, 2021 12:43 PM) Last  dose 09/08/21 - on hold until current symptoms have been resolved.   potassium chloride SA (KLOR-CON M) 20 MEQ tablet [id number] Yes Take 1 tablet (20 mEq total) by mouth daily.  Patient taking differently: Take 20 mEq by mouth every morning.   Rafael Bihari, [place] 09/11/2021 Active Self, Pharmacy Records  Semaglutide [facility]) 14 MG TABS [id number] Yes Take 14 mg by mouth daily.  Patient taking differently: Take 14 mg by mouth every morning.   Shamleffer, Melanie Crazier, MD 09/11/2021 Active Self, Pharmacy Records  tamsulosin [facility]) 0.4 MG CAPS capsule [id number] Yes Take 0.4 mg by mouth 2 (two) times daily.  [provider] 09/11/2021 am Active Self, Pharmacy Records  torsemide (DEMADEX) 20 MG tablet [id number] Yes Take 2 tablets (40 mg total) by mouth 2 (two) times daily. Larey Dresser, MD 09/11/2021 am Active Self, Pharmacy Records  vitamin B-12 (CYANOCOBALAMIN) 500 MCG tablet [id number] No Take 500 mcg by mouth every morning.  Patient not taking: Reported on 09/11/2021   [provider] Not Taking Active Self           Med Note Quinn Axe Sep 11, 2021 12:41 PM) Last dose 09/08/21 - on hold until current symptoms have been resolved.              SDOH:  (Social Determinants of Health) assessments and interventions performed:     Care Plan  Review of patient past medical history, allergies, medications, health status, including review of consultants reports, laboratory and other test data, was performed as part of comprehensive evaluation for care management services.   Care Plan : RN Care Manager plan of care  Updates made by Valente David, RN since 09/14/2021 12:00 AM     Problem: Knowledge deficit related to CHF and care coordination needs   Priority: High     Long-Range Goal: Development plan of care for management  of CHF   Start Date: 08/15/2021  Expected End Date: 02/21/2022  This Visit's Progress: On track  Priority: High  Note:    Current Barriers:  Knowledge Deficits related to plan of care for management of CHF and Stroke   RNCM Clinical Goal(s):  Patient will verbalize basic understanding of  CHF disease process and self health management plan as evidenced by self report take all medications exactly as prescribed and will call provider for medication related questions as evidenced by self report and chart review  through collaboration with RN Care manager, provider, and care team.   Interventions: Inter-disciplinary care team collaboration (see longitudinal plan of care) Evaluation of current treatment plan related to  self management and patient's adherence to plan as established by provider   Heart Failure Interventions:  (Status:  Goal on track:  Yes.) Long Term Goal Basic overview and discussion of pathophysiology of Heart Failure reviewed Provided education on low sodium diet Reviewed Heart Failure Action Plan in depth and provided written copy Assessed need for readable  accurate scales in home Provided education about placing scale on hard, flat surface Advised patient to weigh each morning after emptying bladder Discussed importance of daily weight and advised patient to weigh and record daily Reviewed role of diuretics in prevention of fluid overload and management of heart failure; Discussed the importance of keeping all appointments with provider Provided patient with education about the role of exercise in the management of heart failure Screening for signs and symptoms of depression related to chronic disease state   08/15/2021 Pt reports today baseline weight is 218 lbs with no acute symptoms or fluid retention noted.    Stroke:  (Status:New goal.) Long Term Goal Reviewed Importance of taking all medications as prescribed Screening for signs and symptoms of depression related to chronic disease state Assessed for signs and symptoms of stroke Reviewed referrals to home health  Patient  Goals/Self-Care Activities: Take all medications as prescribed Attend all scheduled provider appointments Call pharmacy for medication refills 3-7 days in advance of running out of medications Attend church or other social activities Perform all self care activities independently  Perform IADL's (shopping, preparing meals, housekeeping, managing finances) independently Call provider office for new concerns or questions  call office if I gain more than 2 pounds in one day or 5 pounds in one week do ankle pumps when sitting keep legs up while sitting track weight in diary use salt in moderation watch for swelling in feet, ankles and legs every day weigh myself daily begin a heart failure diary develop a rescue plan follow rescue plan if symptoms flare-up know when to call the doctor:with fluid retention as educated today related to HF zones track symptoms and what helps feel better or worse   Update 5/24 - Hospitalized 5/21-5/23 for stroke.  State he had been evaluated by symptoms of vertigo but it was determined after an MRI on 5/21 that he was having "mini strokes."  At that time, he was sent to hospital for further evaluation.  Has follow up appointment with PCP tomorrow as well as initial evaluation for home health was completed today.  Report he is well, denies any residual weakness.  Recommendations to follow up with cardiology and neurology.  State he is traveling out of the state for a relative's wedding, will call to schedule these once he returns.  Report he was granted permission to travel by his provider.  Medications reviewed, state he received a 30 day supply upon discharge.  Confirms he received stroke education/book from hospital, denies needing more information sent to the home.   Follow Up Plan:  Telephone follow up appointment with care management team member scheduled for:  June 2023 The patient has been provided with contact information for the care management team and  has been advised to call with any health related questions or concerns.        Plan:  Telephone follow up appointment with care management team member scheduled for:  1 month The patient has been provided with contact information for the care management team and has been advised to call with any health related questions or concerns.   Valente David, RN, MSN, [place] Manager [phone]

## 2021-09-15 DIAGNOSIS — I5022 Chronic systolic (congestive) heart failure: Secondary | ICD-10-CM | POA: Diagnosis not present

## 2021-09-15 DIAGNOSIS — Z8673 Personal history of transient ischemic attack (TIA), and cerebral infarction without residual deficits: Secondary | ICD-10-CM | POA: Diagnosis not present

## 2021-09-15 DIAGNOSIS — E7800 Pure hypercholesterolemia, unspecified: Secondary | ICD-10-CM | POA: Diagnosis not present

## 2021-09-15 DIAGNOSIS — R8290 Unspecified abnormal findings in urine: Secondary | ICD-10-CM | POA: Diagnosis not present

## 2021-09-15 DIAGNOSIS — R9389 Abnormal findings on diagnostic imaging of other specified body structures: Secondary | ICD-10-CM | POA: Diagnosis not present

## 2021-09-15 DIAGNOSIS — N1831 Chronic kidney disease, stage 3a: Secondary | ICD-10-CM | POA: Diagnosis not present

## 2021-09-15 DIAGNOSIS — I10 Essential (primary) hypertension: Secondary | ICD-10-CM | POA: Diagnosis not present

## 2021-09-15 DIAGNOSIS — Z79899 Other long term (current) drug therapy: Secondary | ICD-10-CM | POA: Diagnosis not present

## 2021-09-20 ENCOUNTER — Other Ambulatory Visit (HOSPITAL_COMMUNITY): Payer: Self-pay

## 2021-09-23 ENCOUNTER — Other Ambulatory Visit: Payer: Self-pay | Admitting: Family Medicine

## 2021-09-23 ENCOUNTER — Ambulatory Visit
Admission: RE | Admit: 2021-09-23 | Discharge: 2021-09-23 | Disposition: A | Discharge status: Home / Self Care | Payer: Medicare Other | Source: Ambulatory Visit | Attending: Family Medicine | Admitting: Family Medicine

## 2021-09-23 ENCOUNTER — Telehealth: Payer: Self-pay

## 2021-09-23 ENCOUNTER — Ambulatory Visit: Payer: Medicare Other | Admitting: Cardiology

## 2021-09-23 DIAGNOSIS — R9389 Abnormal findings on diagnostic imaging of other specified body structures: Secondary | ICD-10-CM

## 2021-09-23 DIAGNOSIS — R918 Other nonspecific abnormal finding of lung field: Secondary | ICD-10-CM | POA: Diagnosis not present

## 2021-09-23 DIAGNOSIS — I517 Cardiomegaly: Secondary | ICD-10-CM | POA: Diagnosis not present

## 2021-09-23 NOTE — Telephone Encounter (Signed)
Called pt to inform that Dr. Radford Pax only sees for sleep study.  Pt OV will be canceled and will be rescheduled with Dr. Aundra Dubin. Dr. Radford Pax is requesting that pt be scheduled with Dr. Aundra Dubin for next week.  Will send a message to scheduler with MD request that pt be seen next week.

## 2021-09-26 NOTE — Telephone Encounter (Signed)
Pt scheduled to see HF clinic on 10/12/21.

## 2021-09-28 DIAGNOSIS — H8113 Benign paroxysmal vertigo, bilateral: Secondary | ICD-10-CM | POA: Diagnosis not present

## 2021-09-29 ENCOUNTER — Telehealth (HOSPITAL_COMMUNITY): Payer: Self-pay

## 2021-09-29 NOTE — Telephone Encounter (Signed)
Transitions of Care Pharmacy   Call attempted for a pharmacy transitions of care follow-up. Unable to leave voicemail.  Call attempt #1. Will follow-up in 2-3 days.    

## 2021-10-04 DIAGNOSIS — H8113 Benign paroxysmal vertigo, bilateral: Secondary | ICD-10-CM | POA: Diagnosis not present

## 2021-10-06 DIAGNOSIS — I5020 Unspecified systolic (congestive) heart failure: Secondary | ICD-10-CM | POA: Diagnosis not present

## 2021-10-06 DIAGNOSIS — N183 Chronic kidney disease, stage 3 unspecified: Secondary | ICD-10-CM | POA: Diagnosis not present

## 2021-10-06 DIAGNOSIS — I129 Hypertensive chronic kidney disease with stage 1 through stage 4 chronic kidney disease, or unspecified chronic kidney disease: Secondary | ICD-10-CM | POA: Diagnosis not present

## 2021-10-06 DIAGNOSIS — E1122 Type 2 diabetes mellitus with diabetic chronic kidney disease: Secondary | ICD-10-CM | POA: Diagnosis not present

## 2021-10-06 DIAGNOSIS — N2581 Secondary hyperparathyroidism of renal origin: Secondary | ICD-10-CM | POA: Diagnosis not present

## 2021-10-07 ENCOUNTER — Telehealth (HOSPITAL_COMMUNITY): Payer: Self-pay

## 2021-10-07 DIAGNOSIS — I10 Essential (primary) hypertension: Secondary | ICD-10-CM | POA: Diagnosis not present

## 2021-10-07 DIAGNOSIS — R42 Dizziness and giddiness: Secondary | ICD-10-CM | POA: Diagnosis not present

## 2021-10-07 DIAGNOSIS — I639 Cerebral infarction, unspecified: Secondary | ICD-10-CM | POA: Diagnosis not present

## 2021-10-07 NOTE — Telephone Encounter (Signed)
Transitions of Care Pharmacy   Call attempted for a pharmacy transitions of care follow-up. Unable to leave voicemail.   Call attempt #2. Will follow-up in 2-3 days.    

## 2021-10-10 DIAGNOSIS — H6121 Impacted cerumen, right ear: Secondary | ICD-10-CM | POA: Diagnosis not present

## 2021-10-10 DIAGNOSIS — H903 Sensorineural hearing loss, bilateral: Secondary | ICD-10-CM | POA: Diagnosis not present

## 2021-10-11 DIAGNOSIS — H8113 Benign paroxysmal vertigo, bilateral: Secondary | ICD-10-CM | POA: Diagnosis not present

## 2021-10-12 ENCOUNTER — Telehealth (HOSPITAL_COMMUNITY): Payer: Self-pay | Admitting: Pharmacist

## 2021-10-12 ENCOUNTER — Other Ambulatory Visit (HOSPITAL_COMMUNITY): Payer: Self-pay

## 2021-10-12 ENCOUNTER — Encounter (HOSPITAL_COMMUNITY): Payer: Medicare Other

## 2021-10-14 ENCOUNTER — Other Ambulatory Visit: Payer: Self-pay | Admitting: *Deleted

## 2021-10-18 DIAGNOSIS — H8113 Benign paroxysmal vertigo, bilateral: Secondary | ICD-10-CM | POA: Diagnosis not present

## 2021-10-24 DIAGNOSIS — H8113 Benign paroxysmal vertigo, bilateral: Secondary | ICD-10-CM | POA: Diagnosis not present

## 2021-10-26 ENCOUNTER — Encounter: Payer: Self-pay | Admitting: Internal Medicine

## 2021-10-26 ENCOUNTER — Ambulatory Visit: Payer: Medicare Other | Admitting: Internal Medicine

## 2021-10-26 ENCOUNTER — Ambulatory Visit (INDEPENDENT_AMBULATORY_CARE_PROVIDER_SITE_OTHER): Payer: Medicare Other | Admitting: Internal Medicine

## 2021-10-26 VITALS — BP 126/80 | HR 80 | Ht 67.3 in | Wt 216.0 lb

## 2021-10-26 DIAGNOSIS — E1122 Type 2 diabetes mellitus with diabetic chronic kidney disease: Secondary | ICD-10-CM

## 2021-10-26 DIAGNOSIS — E1159 Type 2 diabetes mellitus with other circulatory complications: Secondary | ICD-10-CM

## 2021-10-26 DIAGNOSIS — N184 Chronic kidney disease, stage 4 (severe): Secondary | ICD-10-CM

## 2021-10-26 MED ORDER — GLIPIZIDE 5 MG PO TABS: 5.0000 mg | ORAL_TABLET | Freq: Two times a day (BID) | ORAL | 3 refills | Status: DC

## 2021-10-26 NOTE — Patient Instructions (Signed)
HOLD Rybelsus  Continue Glipizide 5 mg 1 tablet before Breakfast and 1 tablet before supper, if you have sugars less than 70 in the middle of the night, please DECREASE Supper time Glipizide to half a tablet      HOW TO TREAT LOW BLOOD SUGARS (Blood sugar LESS THAN 70 MG/DL) Please follow the RULE OF 15 for the treatment of hypoglycemia treatment (when your (blood sugars are less than 70 mg/dL)   STEP 1: Take 15 grams of carbohydrates when your blood sugar is low, which includes:  3-4 GLUCOSE TABS  OR 3-4 OZ OF JUICE OR REGULAR SODA OR ONE TUBE OF GLUCOSE GEL    STEP 2: RECHECK blood sugar in 15 MINUTES STEP 3: If your blood sugar is still low at the 15 minute recheck --> then, go back to STEP 1 and treat AGAIN with another 15 grams of carbohydrates.

## 2021-10-26 NOTE — Progress Notes (Signed)
Name: Allen Dyer  Age/ Sex: 86 y.o., male   MRN/ DOB: [id number], [birth year]     PCP: [name], MD   Reason for Endocrinology Evaluation: Type 2 Diabetes Mellitus  Initial Endocrine Consultative Visit: 06/21/2020    PATIENT IDENTIFIER: Allen Dyer is a 86 y.o. male with a past medical history of T2DM, HTN, Neuropathy, CHF (04/2018). The patient has followed with Endocrinology clinic since 06/21/2020 for consultative assistance with management of his diabetes.  DIABETIC HISTORY:  Allen Dyer was diagnosed with DM > 30 yrs.started Rybelsus 05/2020  . His hemoglobin A1c has ranged from 6.6% in 2020, peaking at 8.1% .   SUBJECTIVE:   During the last visit (06/21/2020): A1c 8.8% We decreased Glipizide and increased Rybelsus   Today (10/26/2021): Allen Dyer is here for a follow up on diabetes management . He checks his blood sugars occasionally. The patient has had hypoglycemic episodes since the last clinic visit which occur in the middle of the night, he is symptomatic   Had CVA 08/2021 with dizziness and occasional facial tingling,  he is undergoing PT  Has been out of Rybelsus for the past 3 days  Denies nausea, vomiting or  diarrhea     HOME DIABETES REGIMEN:  Glipizide 5 mg , 1 tablet before Breakfast and half before Supper  Rybelsus 14 mg , 1 tablet before Breakfast      Statin: no ACE-I/ARB: no    METER DOWNLOAD SUMMARY: unable to down load  62- 102 mg/dL      DIABETIC COMPLICATIONS: Microvascular complications:  Neuropathy, CKD IV Denies: retinopathy Last Eye Exam: Completed 10/2020  Macrovascular complications:  CHF, CVA 08/2021 Denies: CAD, PVD   HISTORY:  Past Medical History:  Past Medical History:  Diagnosis Date   Chronic combined systolic and diastolic CHF (congestive heart failure) (HCC)    CKD (chronic kidney disease), stage III (HCC)    Hyperlipidemia    Hypertension    Lower extremity edema    Mild CAD    a. mild-mod by cath 05/2018.    Multiple myeloma ([place]) 11/15/2018   NICM (nonischemic cardiomyopathy) ([place])    Peripheral neuropathy    Prostate cancer ([place])    Status post XRT   Rheumatic fever    Trifascicular block    Type 2 diabetes mellitus ([place])    Past Surgical History:  Past Surgical History:  Procedure Laterality Date   RIGHT HEART CATH N/A 07/10/2018   Procedure: RIGHT HEART CATH;  Surgeon: Larey Dresser, MD;  Location: [facility];  Service: Cardiovascular;  Laterality: N/A;   RIGHT/LEFT HEART CATH AND CORONARY ANGIOGRAPHY N/A 06/18/2018   Procedure: RIGHT/LEFT HEART CATH AND CORONARY ANGIOGRAPHY;  Surgeon: Troy Sine, MD;  Location: [facility] [place];  Service: Cardiovascular;  Laterality: N/A;   Social History:  reports that he quit smoking about 50 years ago. His smoking use included cigarettes. He has a 20.00 pack-year smoking history. He has never used smokeless tobacco. He reports that he does not currently use alcohol. He reports that he does not use drugs. Family History:  Family History  Problem Relation Age of Onset   Cancer Mother    Congestive Heart Failure Father        died from heart failure at the age of 64   Cancer Sister    Congestive Heart Failure Brother        died from heart failure at the age of 21   Stroke Neg Hx  HOME MEDICATIONS: Allergies as of 10/26/2021       Reactions   Tramadol Other (See Comments)   Dizziness (intolerance) hallucinate   Finasteride Swelling   Breast enlargement   Lisinopril Cough   Ciprofloxacin Other (See Comments)   Dizziness (intolerance)   Simvastatin Hives, Other (See Comments)   Blisters/ Rash   Sulfa Antibiotics Rash        Medication List        Accurate as of October 26, 2021 11:02 AM. If you have any questions, ask your nurse or doctor.          allopurinol 100 MG tablet Commonly known as: ZYLOPRIM Take 50 mg by mouth every morning.   Aspirin Low Dose 81 MG chewable tablet Generic drug: aspirin Chew  1 tablet (81 mg total) by mouth daily.   carvedilol 6.25 MG tablet Commonly known as: COREG Take 1.5 tablets (9.375 mg total) by mouth 2 (two) times daily with a meal.   clopidogrel 75 MG tablet Commonly known as: PLAVIX Take 1 tablet (75 mg total) by mouth daily.   eplerenone 50 MG tablet Commonly known as: INSPRA Take 1 tablet (50 mg total) by mouth daily. What changed: when to take this   ezetimibe 10 MG tablet Commonly known as: ZETIA Take 1 tablet (10 mg total) by mouth daily.   FREESTYLE LITE test strip Generic drug: glucose blood 1 each by Other route daily in the afternoon. Use as instructed   FreeStyle Lite w/Device Kit 1 Device by Does not apply route daily in the afternoon.   gabapentin 600 MG tablet Commonly known as: NEURONTIN Take 300 mg by mouth every morning.   Garlic 1027 MG Caps Take 1,000 mg by mouth every morning.   glipiZIDE 5 MG tablet Commonly known as: GLUCOTROL Take 1 tablet (5 mg total) by mouth daily before breakfast AND 0.5 tablets (2.5 mg total) daily before supper.   hydrALAZINE 50 MG tablet Commonly known as: APRESOLINE Take 1 tablet (50 mg total) by mouth 3 (three) times daily. 6am, 2pm and 10pm   isosorbide mononitrate 30 MG 24 hr tablet Commonly known as: IMDUR Take 1 tablet (30 mg total) by mouth every morning.   losartan 25 MG tablet Commonly known as: COZAAR Take 25 mg by mouth daily.   multivitamin with minerals Tabs tablet Take 1 tablet by mouth every morning.   pantoprazole 40 MG tablet Commonly known as: PROTONIX Take 1 tablet (40 mg total) by mouth daily. What changed: Another medication with the same name was removed. Continue taking this medication, and follow the directions you see here. Changed by: Dorita Sciara, MD   potassium chloride SA 20 MEQ tablet Commonly known as: KLOR-CON M Take 1 tablet (20 mEq total) by mouth daily. What changed: when to take this   Rybelsus 14 MG Tabs Generic drug:  Semaglutide Take 14 mg by mouth daily. What changed: when to take this   tamsulosin 0.4 MG Caps capsule Commonly known as: FLOMAX Take 0.4 mg by mouth 2 (two) times daily.   torsemide 20 MG tablet Commonly known as: DEMADEX Take 2 tablets (40 mg total) by mouth 2 (two) times daily.   vitamin B-12 500 MCG tablet Commonly known as: CYANOCOBALAMIN Take 500 mcg by mouth every morning.   vitamin C 1000 MG tablet Take 4,000 mg by mouth every morning.   VITAMIN D3 PO Take 1 capsule by mouth every morning.         OBJECTIVE:  Vital Signs:BP 126/80 (BP Location: Left Arm, Patient Position: Sitting, Cuff Size: Large)   Pulse 80   Ht 5' 7.3" (1.709 m)   Wt 216 lb (98 kg)   SpO2 94%   BMI 33.53 kg/m   Wt Readings from Last 3 Encounters:  10/26/21 216 lb (98 kg)  09/11/21 193 lb 5.5 oz (87.7 kg)  08/10/21 218 lb (98.9 kg)     Exam: General: Pt appears well and is in NAD  Lungs: Clear with good BS bilat with no rales, rhonchi, or wheezes  Heart: RRR with normal S1 and S2 and no gallops; no murmurs; no rub  Extremities: Trace  pretibial edema. .  Neuro: MS is good with appropriate affect, pt is alert and Ox3    DM foot exam: 03/30/2021  The skin of the feet is without sores or ulcerations.Toe nails discolored and dystrophic The pedal pulses are undetectable on today's exam  The sensation is decreased  to a screening 5.07, 10 gram monofilament bilaterally    DATA REVIEWED:  Lab Results  Component Value Date   HGBA1C 6.7 (H) 09/12/2021   HGBA1C 9.0 (A) 03/30/2021   HGBA1C 7.5 (A) 09/22/2020   Lab Results  Component Value Date   LDLCALC 79 09/12/2021   CREATININE 2.14 (H) 09/13/2021   No results found for: "MICRALBCREAT"   Lab Results  Component Value Date   CHOL 142 09/12/2021   HDL 35 (L) 09/12/2021   LDLCALC 79 09/12/2021   TRIG 139 09/12/2021   CHOLHDL 4.1 09/12/2021         ASSESSMENT / PLAN / RECOMMENDATIONS:   1) Type 2 Diabetes Mellitus,  OPtimally controlled, With CKD IV, neuropathic and macrovascular complications - Most recent A1c of 6.7  %. Goal A1c < 7.5 %.    - CGM cost prohibitive  - Rybelsus cost prohibitive  - He stopped Rybelsus and increased supper time Glipizide from half a tablet to 1 tablet. So far no hypoglycemia since being off Rybelsus - He was advised to continue Glipizide before Breakfast and Supper but if hypoglycemia recurs at night, he will need to reduce supper Glipizide to half a tablet    MEDICATIONS: - Continue  Glipizide 5 mg , 1 tablet before Breakfast  and  tablet before Supper   - Hold Rybelsus 14 mg , 1 tablet before Breakfast   EDUCATION / INSTRUCTIONS: BG monitoring instructions: Patient is instructed to check his blood sugars 1 times a day, fasting . Call [place] [facility] if: BG persistently < 70 . I reviewed the Rule of 15 for the treatment of hypoglycemia in detail with the patient. Literature supplied.     2) Diabetic complications:  Eye: Does not have known diabetic retinopathy.  Neuro/ Feet: Does have known diabetic peripheral neuropathy .  Renal: Patient does  have known baseline CKD. He   is not on an ACEI/ARB at present. Will defer to nephrology     F/U in 4 months    Signed electronically by: Mack Guise, MD  [facility]  [facility] [address] [place], [place] [address] Phone: [phone] FAX: [phone]   CC: [name], [address] [address] Phone: [phone]  Fax: [phone]  Return to Endocrinology clinic as below: Future Appointments  Date Time Provider [place]  11/17/2021 11:00 AM Tobi Bastos, RN [facility] None  11/23/2021 10:45 AM Gardiner Barefoot, DPM [facility]  12/12/2021  1:00 PM  [facility] None  12/19/2021 12:00 PM Heath Lark, MD [facility] None  01/16/2022  2:40 PM Larey Dresser, MD [facility] None

## 2021-11-15 DIAGNOSIS — I129 Hypertensive chronic kidney disease with stage 1 through stage 4 chronic kidney disease, or unspecified chronic kidney disease: Secondary | ICD-10-CM | POA: Diagnosis not present

## 2021-11-15 DIAGNOSIS — H8113 Benign paroxysmal vertigo, bilateral: Secondary | ICD-10-CM | POA: Diagnosis not present

## 2021-11-17 ENCOUNTER — Encounter: Payer: Self-pay | Admitting: *Deleted

## 2021-11-17 ENCOUNTER — Other Ambulatory Visit: Payer: Self-pay | Admitting: *Deleted

## 2021-11-17 NOTE — Patient Outreach (Signed)
  Care Coordination   Follow Up Visit Note   11/17/2021 Name: Allen Dyer MRN: [id number] DOB: 11-24-1935  Allen Dyer is a 86 y.o. year old male who sees Koirala, Dibas, MD for primary care. I spoke with  Allen Dyer by phone today  What matters to the patients health and wellness today?  "Doing good" with no reported needs.   Goals Addressed               This Visit's Progress     "Doing Good" (pt-stated)        Care Coordination Interventions: Advised patient to reach out to his provider with any concerns or issues that may arise Reviewed scheduled/upcoming provider appointments including 8/2, 8/21, 8/28, 9/25, and 04/26/2022. Pt lives with his daughter who is the primary caregiver and provides pt with sufficient transportation when needed.   Previous issues resolved from last conversation with medication reaction lowering his BP. Pt reports he has stopped this medication and no longer having issues. All medication reviewed and verified along with gaps in care. No reported issues with HTN or HF as pt continues self health management of care appropriately.          SDOH assessments and interventions completed:   Yes SDOH Interventions Today    Flowsheet Row Most Recent Value  SDOH Interventions   Food Insecurity Interventions Intervention Not Indicated  Housing Interventions Intervention Not Indicated       Care Coordination Interventions Activated:  Yes Care Coordination Interventions:  Yes, provided  Follow up plan: No further intervention required.  Encounter Outcome:  Pt. Visit Completed   Raina Mina, RN Care Management Coordinator [employer] Office [phone]

## 2021-11-22 DIAGNOSIS — H8113 Benign paroxysmal vertigo, bilateral: Secondary | ICD-10-CM | POA: Diagnosis not present

## 2021-11-23 ENCOUNTER — Encounter: Payer: Self-pay | Admitting: Podiatry

## 2021-11-23 ENCOUNTER — Ambulatory Visit (INDEPENDENT_AMBULATORY_CARE_PROVIDER_SITE_OTHER): Payer: Medicare Other | Admitting: Podiatry

## 2021-11-23 DIAGNOSIS — B351 Tinea unguium: Secondary | ICD-10-CM

## 2021-11-23 DIAGNOSIS — M79674 Pain in right toe(s): Secondary | ICD-10-CM | POA: Diagnosis not present

## 2021-11-23 DIAGNOSIS — E1159 Type 2 diabetes mellitus with other circulatory complications: Secondary | ICD-10-CM | POA: Diagnosis not present

## 2021-11-23 DIAGNOSIS — N183 Chronic kidney disease, stage 3 unspecified: Secondary | ICD-10-CM | POA: Diagnosis not present

## 2021-11-23 DIAGNOSIS — E1142 Type 2 diabetes mellitus with diabetic polyneuropathy: Secondary | ICD-10-CM | POA: Diagnosis not present

## 2021-11-23 DIAGNOSIS — M79675 Pain in left toe(s): Secondary | ICD-10-CM

## 2021-11-23 NOTE — Progress Notes (Signed)
This patient returns to my office for at risk foot care.  This patient requires this care by a professional since this patient will be at risk due to having  Diabetes and chronic kidney disease.   This patient is unable to cut nails himself  since the patient cannot reach his  nails.These nails are painful walking and wearing shoes.  This patient presents for at risk foot care today.    General Appearance  Alert, conversant and in no acute stress.  Vascular  Dorsalis pedis and posterior tibial  pulses are palpable  bilaterally.  Capillary return is within normal limits  bilaterally. Temperature is within normal limits  bilaterally.  Neurologic  Senn-Weinstein monofilament wire test diminished   bilaterally. Muscle power within normal limits bilaterally.  Nails Thick disfigured discolored nails with subungual debris  from hallux to fifth toes bilaterally. No evidence of bacterial infection or drainage bilaterally.  Orthopedic  No limitations of motion  feet .  No crepitus or effusions noted.  No bony pathology or digital deformities noted.  Arthritis right foot/ankle.  Skin  normotropic skin with no porokeratosis noted bilaterally.  No signs of infections or ulcers noted.   Asymptomatic porokeratosis sub 5th  B/L.  Onychomycosis  Pain in right toes  Pain in left toes  Consent was obtained for treatment procedures.   Mechanical debridement of nails 1-5  bilaterally performed with a nail nipper.  Filed with dremel without incident. No infection or ulcer.     Return office visit   3 months       Told patient to return for periodic foot care and evaluation due to potential at risk complications.   Susy Placzek DPM  

## 2021-12-12 ENCOUNTER — Inpatient Hospital Stay: Payer: Medicare Other

## 2021-12-13 ENCOUNTER — Inpatient Hospital Stay: Payer: Medicare Other | Attending: Hematology and Oncology

## 2021-12-13 ENCOUNTER — Other Ambulatory Visit: Payer: Self-pay

## 2021-12-13 DIAGNOSIS — N184 Chronic kidney disease, stage 4 (severe): Secondary | ICD-10-CM | POA: Diagnosis not present

## 2021-12-13 DIAGNOSIS — D472 Monoclonal gammopathy: Secondary | ICD-10-CM | POA: Diagnosis not present

## 2021-12-13 DIAGNOSIS — Z79899 Other long term (current) drug therapy: Secondary | ICD-10-CM | POA: Diagnosis not present

## 2021-12-13 DIAGNOSIS — D539 Nutritional anemia, unspecified: Secondary | ICD-10-CM | POA: Insufficient documentation

## 2021-12-13 LAB — COMPREHENSIVE METABOLIC PANEL
ALT: 15 U/L (ref 0–44)
AST: 16 U/L (ref 15–41)
Albumin: 3.9 g/dL (ref 3.5–5.0)
Alkaline Phosphatase: 60 U/L (ref 38–126)
Anion gap: 5 (ref 5–15)
BUN: 30 mg/dL — ABNORMAL HIGH (ref 8–23)
CO2: 33 mmol/L — ABNORMAL HIGH (ref 22–32)
Calcium: 9.5 mg/dL (ref 8.9–10.3)
Chloride: 104 mmol/L (ref 98–111)
Creatinine, Ser: 2.3 mg/dL — ABNORMAL HIGH (ref 0.61–1.24)
GFR, Estimated: 27 mL/min — ABNORMAL LOW (ref >60)
Glucose, Bld: 237 mg/dL — ABNORMAL HIGH (ref 70–99)
Potassium: 3.9 mmol/L (ref 3.5–5.1)
Sodium: 142 mmol/L (ref 135–145)
Total Bilirubin: 0.4 mg/dL (ref 0.3–1.2)
Total Protein: 6.9 g/dL (ref 6.5–8.1)

## 2021-12-13 LAB — CBC WITH DIFFERENTIAL/PLATELET
Abs Immature Granulocytes: 0.02 10*3/uL (ref 0.00–0.07)
Basophils Absolute: 0 10*3/uL (ref 0.0–0.1)
Basophils Relative: 0 %
Eosinophils Absolute: 0.3 10*3/uL (ref 0.0–0.5)
Eosinophils Relative: 5 %
HCT: 33.7 % — ABNORMAL LOW (ref 39.0–52.0)
Hemoglobin: 11.3 g/dL — ABNORMAL LOW (ref 13.0–17.0)
Immature Granulocytes: 0 %
Lymphocytes Relative: 25 %
Lymphs Abs: 1.2 10*3/uL (ref 0.7–4.0)
MCH: 33 pg (ref 26.0–34.0)
MCHC: 33.5 g/dL (ref 30.0–36.0)
MCV: 98.5 fL (ref 80.0–100.0)
Monocytes Absolute: 0.5 10*3/uL (ref 0.1–1.0)
Monocytes Relative: 9 %
Neutro Abs: 2.9 10*3/uL (ref 1.7–7.7)
Neutrophils Relative %: 61 %
Platelets: 161 10*3/uL (ref 150–400)
RBC: 3.42 MIL/uL — ABNORMAL LOW (ref 4.22–5.81)
RDW: 14.2 % (ref 11.5–15.5)
WBC: 4.9 10*3/uL (ref 4.0–10.5)
nRBC: 0 % (ref 0.0–0.2)

## 2021-12-14 LAB — KAPPA/LAMBDA LIGHT CHAINS
Kappa free light chain: 160 mg/L — ABNORMAL HIGH (ref 3.3–19.4)
Kappa, lambda light chain ratio: 7.21 — ABNORMAL HIGH (ref 0.26–1.65)
Lambda free light chains: 22.2 mg/L (ref 5.7–26.3)

## 2021-12-19 ENCOUNTER — Inpatient Hospital Stay (HOSPITAL_BASED_OUTPATIENT_CLINIC_OR_DEPARTMENT_OTHER): Payer: Medicare Other | Admitting: Hematology and Oncology

## 2021-12-19 ENCOUNTER — Other Ambulatory Visit: Payer: Self-pay

## 2021-12-19 ENCOUNTER — Telehealth: Payer: Self-pay

## 2021-12-19 DIAGNOSIS — N184 Chronic kidney disease, stage 4 (severe): Secondary | ICD-10-CM | POA: Diagnosis not present

## 2021-12-19 DIAGNOSIS — D472 Monoclonal gammopathy: Secondary | ICD-10-CM | POA: Diagnosis not present

## 2021-12-19 DIAGNOSIS — D539 Nutritional anemia, unspecified: Secondary | ICD-10-CM

## 2021-12-19 DIAGNOSIS — Z79899 Other long term (current) drug therapy: Secondary | ICD-10-CM | POA: Diagnosis not present

## 2021-12-19 LAB — MULTIPLE MYELOMA PANEL, SERUM
Albumin SerPl Elph-Mcnc: 3.6 g/dL (ref 2.9–4.4)
Albumin/Glob SerPl: 1.3 (ref 0.7–1.7)
Alpha 1: 0.2 g/dL (ref 0.0–0.4)
Alpha2 Glob SerPl Elph-Mcnc: 0.6 g/dL (ref 0.4–1.0)
B-Globulin SerPl Elph-Mcnc: 1.3 g/dL (ref 0.7–1.3)
Gamma Glob SerPl Elph-Mcnc: 1 g/dL (ref 0.4–1.8)
Globulin, Total: 3 g/dL (ref 2.2–3.9)
IgA: 738 mg/dL — ABNORMAL HIGH (ref 61–437)
IgG (Immunoglobin G), Serum: 1080 mg/dL (ref 603–1613)
IgM (Immunoglobulin M), Srm: 29 mg/dL (ref 15–143)
M Protein SerPl Elph-Mcnc: 0.6 g/dL — ABNORMAL HIGH
Total Protein ELP: 6.6 g/dL (ref 6.0–8.5)

## 2021-12-19 NOTE — Telephone Encounter (Signed)
Called and given below message. He verbalized understanding and appreciated the call. 

## 2021-12-19 NOTE — Telephone Encounter (Signed)
-----   Message from Heath Lark, MD sent at 12/19/2021  3:39 PM EDT ----- Regarding: myeloma panel Pls call him Results came back good, stable See him next year

## 2021-12-20 ENCOUNTER — Encounter: Payer: Self-pay | Admitting: Hematology and Oncology

## 2021-12-20 NOTE — Assessment & Plan Note (Signed)
He has multifactorial anemia, likely related to anemia chronic kidney disease Observe closely Previously, I have ordered serum B12 and iron levels and they were within normal range He is not symptomatic

## 2021-12-20 NOTE — Assessment & Plan Note (Signed)
Overall, I do not believe he has disease progression The elevated serum light chains are likely due to recent changes in his renal function I recommend repeat blood work next year for further follow-up He is in agreement

## 2021-12-20 NOTE — Assessment & Plan Note (Signed)
He has intermittent elevated kidney function on and off over the years We discussed importance of adequate hydration We discussed limitations and difficulties in interpreting his light chain level due to elevated serum creatinine I recommend the patient to increase oral fluid hydration as tolerated

## 2021-12-20 NOTE — Progress Notes (Signed)
[facility] OFFICE PROGRESS NOTE  Patient Care Team: Koirala, Dibas, MD as PCP - General (Family Medicine) [name], MD as PCP - Cardiology (Cardiology)  ASSESSMENT & PLAN:  Smoldering multiple myeloma Overall, I do not believe he has disease progression The elevated serum light chains are likely due to recent changes in his renal function I recommend repeat blood work next year for further follow-up He is in agreement  Chronic kidney disease (CKD), stage IV (severe) ([place]) He has intermittent elevated kidney function on and off over the years We discussed importance of adequate hydration We discussed limitations and difficulties in interpreting his light chain level due to elevated serum creatinine I recommend the patient to increase oral fluid hydration as tolerated  Deficiency anemia He has multifactorial anemia, likely related to anemia chronic kidney disease Observe closely Previously, I have ordered serum B12 and iron levels and they were within normal range He is not symptomatic  No orders of the defined types were placed in this encounter.   All questions were answered. The patient knows to call the clinic with any problems, questions or concerns. The total time spent in the appointment was 20 minutes encounter with patients including review of chart and various tests results, discussions about plan of care and coordination of care plan   Heath Lark, MD 12/20/2021 10:27 AM  INTERVAL HISTORY: Please see below for problem oriented charting. he returns for surveillance follow-up for history of smoldering myeloma He is not symptomatic No recent infection or bone pain  REVIEW OF SYSTEMS:   Constitutional: Denies fevers, chills or abnormal weight loss Eyes: Denies blurriness of vision Ears, nose, mouth, throat, and face: Denies mucositis or sore throat Respiratory: Denies cough, dyspnea or wheezes Cardiovascular: Denies palpitation, chest discomfort  or lower extremity swelling Gastrointestinal:  Denies nausea, heartburn or change in bowel habits Skin: Denies abnormal skin rashes Lymphatics: Denies new lymphadenopathy or easy bruising Neurological:Denies numbness, tingling or new weaknesses Behavioral/Psych: Mood is stable, no new changes  All other systems were reviewed with the patient and are negative.  I have reviewed the past medical history, past surgical history, social history and family history with the patient and they are unchanged from previous note.  ALLERGIES:  is allergic to tramadol, finasteride, lisinopril, ciprofloxacin, simvastatin, and sulfa antibiotics.  MEDICATIONS:  Current Outpatient Medications  Medication Sig Dispense Refill   allopurinol (ZYLOPRIM) 100 MG tablet Take 50 mg by mouth every morning.     Ascorbic Acid (VITAMIN C) 1000 MG tablet Take 4,000 mg by mouth every morning.     aspirin 81 MG chewable tablet Chew 1 tablet (81 mg total) by mouth daily. 30 tablet 11   Blood Glucose Monitoring Suppl (FREESTYLE LITE) w/Device KIT 1 Device by Does not apply route daily in the afternoon. 1 kit 0   carvedilol (COREG) 6.25 MG tablet Take 1.5 tablets (9.375 mg total) by mouth 2 (two) times daily with a meal. 90 tablet 6   Cholecalciferol (VITAMIN D3 PO) Take 1 capsule by mouth every morning.     clopidogrel (PLAVIX) 75 MG tablet Take 1 tablet (75 mg total) by mouth daily. 30 tablet 2   eplerenone (INSPRA) 50 MG tablet Take 1 tablet (50 mg total) by mouth daily. (Patient taking differently: Take 50 mg by mouth every morning.) 90 tablet 3   ezetimibe (ZETIA) 10 MG tablet Take 1 tablet (10 mg total) by mouth daily. 30 tablet 3   gabapentin (NEURONTIN) 600 MG tablet Take  300 mg by mouth every morning.     Garlic 3976 MG CAPS Take 1,000 mg by mouth every morning.     glipiZIDE (GLUCOTROL) 5 MG tablet Take 1 tablet (5 mg total) by mouth 2 (two) times daily before a meal. 135 tablet 3   glucose blood (FREESTYLE LITE) test  strip 1 each by Other route daily in the afternoon. Use as instructed 100 each 3   hydrALAZINE (APRESOLINE) 50 MG tablet Take 1 tablet (50 mg total) by mouth 3 (three) times daily. 6am, 2pm and 10pm     isosorbide mononitrate (IMDUR) 30 MG 24 hr tablet Take 1 tablet (30 mg total) by mouth every morning.     losartan (COZAAR) 25 MG tablet Take 25 mg by mouth daily.     Multiple Vitamin (MULTIVITAMIN WITH MINERALS) TABS tablet Take 1 tablet by mouth every morning.     pantoprazole (PROTONIX) 40 MG tablet Take 1 tablet (40 mg total) by mouth daily. 30 tablet 2   potassium chloride SA (KLOR-CON M) 20 MEQ tablet Take 1 tablet (20 mEq total) by mouth daily. (Patient taking differently: Take 20 mEq by mouth every morning.) 90 tablet 3   tamsulosin (FLOMAX) 0.4 MG CAPS capsule Take 0.4 mg by mouth 2 (two) times daily.      torsemide (DEMADEX) 20 MG tablet Take 2 tablets (40 mg total) by mouth 2 (two) times daily. 90 tablet 15   vitamin B-12 (CYANOCOBALAMIN) 500 MCG tablet Take 500 mcg by mouth every morning.     No current facility-administered medications for this visit.    SUMMARY OF ONCOLOGIC HISTORY: Oncology History  Smoldering multiple myeloma  10/17/2018 Imaging   1. Interim clearing of congestive heart failure. Persistent cardiomegaly.   2.  No evidence of metastatic disease or myeloma   11/04/2018 Bone Marrow Biopsy   Bone Marrow, Aspirate,Biopsy, and Clot, right ilium - PLASMA CELL NEOPLASM, SEE COMMENT. - ATYPICAL MEGAKARYOCYTES. PERIPHERAL BLOOD: - NORMOCYTIC ANEMIA. Diagnosis Note The marrow is mildly hypercellular for age with increased plasma cells (15% by CD138 immunohistochemistry). These findings are consistent with a plasma cell neoplasm. Correlation with clinical, radiographic, and laboratory data is required for further classification. There are a few atypical megakaryocytes the significance of which is unclear.  Del 13q noted but normal cytogenetics Congo red stain is  negative for amyloid     PHYSICAL EXAMINATION: ECOG PERFORMANCE STATUS: 1 - Symptomatic but completely ambulatory  Vitals:   12/19/21 1210  BP: (!) 142/76  Pulse: 60  Resp: 18  Temp: 98.6 F (37 C)  SpO2: 93%   Filed Weights   12/19/21 1210  Weight: 222 lb (100.7 kg)    GENERAL:alert, no distress and comfortable NEURO: alert & oriented x 3 with fluent speech, no focal motor/sensory deficits  LABORATORY DATA:  I have reviewed the data as listed    Component Value Date/Time   NA 142 12/13/2021 1255   NA 143 07/01/2018 1233   K 3.9 12/13/2021 1255   CL 104 12/13/2021 1255   CO2 33 (H) 12/13/2021 1255   GLUCOSE 237 (H) 12/13/2021 1255   BUN 30 (H) 12/13/2021 1255   BUN 27 07/01/2018 1233   CREATININE 2.30 (H) 12/13/2021 1255   CREATININE 2.06 (H) 10/10/2018 1404   CALCIUM 9.5 12/13/2021 1255   PROT 6.9 12/13/2021 1255   ALBUMIN 3.9 12/13/2021 1255   AST 16 12/13/2021 1255   AST 20 10/10/2018 1404   ALT 15 12/13/2021 1255   ALT 16  10/10/2018 1404   ALKPHOS 60 12/13/2021 1255   BILITOT 0.4 12/13/2021 1255   BILITOT 0.4 10/10/2018 1404   GFRNONAA 27 (L) 12/13/2021 1255   GFRNONAA 29 (L) 10/10/2018 1404   GFRAA 26 (L) 12/02/2019 1108   GFRAA 34 (L) 10/10/2018 1404    Lab Results  Component Value Date   SPEP Comment 10/10/2018    Lab Results  Component Value Date   WBC 4.9 12/13/2021   NEUTROABS 2.9 12/13/2021   HGB 11.3 (L) 12/13/2021   HCT 33.7 (L) 12/13/2021   MCV 98.5 12/13/2021   PLT 161 12/13/2021      Chemistry      Component Value Date/Time   NA 142 12/13/2021 1255   NA 143 07/01/2018 1233   K 3.9 12/13/2021 1255   CL 104 12/13/2021 1255   CO2 33 (H) 12/13/2021 1255   BUN 30 (H) 12/13/2021 1255   BUN 27 07/01/2018 1233   CREATININE 2.30 (H) 12/13/2021 1255   CREATININE 2.06 (H) 10/10/2018 1404      Component Value Date/Time   CALCIUM 9.5 12/13/2021 1255   ALKPHOS 60 12/13/2021 1255   AST 16 12/13/2021 1255   AST 20 10/10/2018 1404    ALT 15 12/13/2021 1255   ALT 16 10/10/2018 1404   BILITOT 0.4 12/13/2021 1255   BILITOT 0.4 10/10/2018 1404

## 2021-12-30 DIAGNOSIS — Z79899 Other long term (current) drug therapy: Secondary | ICD-10-CM | POA: Diagnosis not present

## 2021-12-30 DIAGNOSIS — I5022 Chronic systolic (congestive) heart failure: Secondary | ICD-10-CM | POA: Diagnosis not present

## 2021-12-30 DIAGNOSIS — C9000 Multiple myeloma not having achieved remission: Secondary | ICD-10-CM | POA: Diagnosis not present

## 2021-12-30 DIAGNOSIS — Z23 Encounter for immunization: Secondary | ICD-10-CM | POA: Diagnosis not present

## 2021-12-30 DIAGNOSIS — Z0001 Encounter for general adult medical examination with abnormal findings: Secondary | ICD-10-CM | POA: Diagnosis not present

## 2021-12-30 DIAGNOSIS — N184 Chronic kidney disease, stage 4 (severe): Secondary | ICD-10-CM | POA: Diagnosis not present

## 2021-12-30 DIAGNOSIS — D472 Monoclonal gammopathy: Secondary | ICD-10-CM | POA: Diagnosis not present

## 2021-12-30 DIAGNOSIS — E7800 Pure hypercholesterolemia, unspecified: Secondary | ICD-10-CM | POA: Diagnosis not present

## 2021-12-30 DIAGNOSIS — E1169 Type 2 diabetes mellitus with other specified complication: Secondary | ICD-10-CM | POA: Diagnosis not present

## 2021-12-30 DIAGNOSIS — Z8546 Personal history of malignant neoplasm of prostate: Secondary | ICD-10-CM | POA: Diagnosis not present

## 2022-01-03 DIAGNOSIS — N2581 Secondary hyperparathyroidism of renal origin: Secondary | ICD-10-CM | POA: Diagnosis not present

## 2022-01-03 DIAGNOSIS — E1122 Type 2 diabetes mellitus with diabetic chronic kidney disease: Secondary | ICD-10-CM | POA: Diagnosis not present

## 2022-01-03 DIAGNOSIS — N183 Chronic kidney disease, stage 3 unspecified: Secondary | ICD-10-CM | POA: Diagnosis not present

## 2022-01-03 DIAGNOSIS — I5020 Unspecified systolic (congestive) heart failure: Secondary | ICD-10-CM | POA: Diagnosis not present

## 2022-01-03 DIAGNOSIS — I129 Hypertensive chronic kidney disease with stage 1 through stage 4 chronic kidney disease, or unspecified chronic kidney disease: Secondary | ICD-10-CM | POA: Diagnosis not present

## 2022-01-03 DIAGNOSIS — N184 Chronic kidney disease, stage 4 (severe): Secondary | ICD-10-CM | POA: Diagnosis not present

## 2022-01-03 DIAGNOSIS — D472 Monoclonal gammopathy: Secondary | ICD-10-CM | POA: Diagnosis not present

## 2022-01-16 ENCOUNTER — Ambulatory Visit (HOSPITAL_COMMUNITY)
Admission: RE | Admit: 2022-01-16 | Discharge: 2022-01-16 | Disposition: A | Discharge status: Home / Self Care | Payer: Medicare Other | Source: Ambulatory Visit | Attending: Cardiology | Admitting: Cardiology

## 2022-01-16 VITALS — BP 150/80 | HR 70 | Wt 220.8 lb

## 2022-01-16 DIAGNOSIS — I5042 Chronic combined systolic (congestive) and diastolic (congestive) heart failure: Secondary | ICD-10-CM

## 2022-01-16 DIAGNOSIS — E1122 Type 2 diabetes mellitus with diabetic chronic kidney disease: Secondary | ICD-10-CM | POA: Diagnosis not present

## 2022-01-16 DIAGNOSIS — I428 Other cardiomyopathies: Secondary | ICD-10-CM | POA: Diagnosis not present

## 2022-01-16 DIAGNOSIS — R42 Dizziness and giddiness: Secondary | ICD-10-CM | POA: Insufficient documentation

## 2022-01-16 DIAGNOSIS — I130 Hypertensive heart and chronic kidney disease with heart failure and stage 1 through stage 4 chronic kidney disease, or unspecified chronic kidney disease: Secondary | ICD-10-CM | POA: Diagnosis not present

## 2022-01-16 DIAGNOSIS — I779 Disorder of arteries and arterioles, unspecified: Secondary | ICD-10-CM | POA: Insufficient documentation

## 2022-01-16 DIAGNOSIS — H538 Other visual disturbances: Secondary | ICD-10-CM | POA: Insufficient documentation

## 2022-01-16 DIAGNOSIS — R0602 Shortness of breath: Secondary | ICD-10-CM | POA: Diagnosis not present

## 2022-01-16 DIAGNOSIS — E7800 Pure hypercholesterolemia, unspecified: Secondary | ICD-10-CM | POA: Diagnosis not present

## 2022-01-16 DIAGNOSIS — N183 Chronic kidney disease, stage 3 unspecified: Secondary | ICD-10-CM | POA: Insufficient documentation

## 2022-01-16 DIAGNOSIS — Z923 Personal history of irradiation: Secondary | ICD-10-CM | POA: Insufficient documentation

## 2022-01-16 DIAGNOSIS — R0789 Other chest pain: Secondary | ICD-10-CM | POA: Insufficient documentation

## 2022-01-16 DIAGNOSIS — Z8546 Personal history of malignant neoplasm of prostate: Secondary | ICD-10-CM | POA: Diagnosis not present

## 2022-01-16 DIAGNOSIS — I272 Pulmonary hypertension, unspecified: Secondary | ICD-10-CM | POA: Insufficient documentation

## 2022-01-16 DIAGNOSIS — Z79899 Other long term (current) drug therapy: Secondary | ICD-10-CM | POA: Insufficient documentation

## 2022-01-16 DIAGNOSIS — E785 Hyperlipidemia, unspecified: Secondary | ICD-10-CM | POA: Diagnosis not present

## 2022-01-16 DIAGNOSIS — I5022 Chronic systolic (congestive) heart failure: Secondary | ICD-10-CM | POA: Diagnosis not present

## 2022-01-16 DIAGNOSIS — I2510 Atherosclerotic heart disease of native coronary artery without angina pectoris: Secondary | ICD-10-CM | POA: Diagnosis not present

## 2022-01-16 DIAGNOSIS — I4510 Unspecified right bundle-branch block: Secondary | ICD-10-CM | POA: Insufficient documentation

## 2022-01-16 DIAGNOSIS — Z8673 Personal history of transient ischemic attack (TIA), and cerebral infarction without residual deficits: Secondary | ICD-10-CM | POA: Insufficient documentation

## 2022-01-16 LAB — BASIC METABOLIC PANEL
Anion gap: 8 (ref 5–15)
BUN: 22 mg/dL (ref 8–23)
CO2: 29 mmol/L (ref 22–32)
Calcium: 9.2 mg/dL (ref 8.9–10.3)
Chloride: 103 mmol/L (ref 98–111)
Creatinine, Ser: 2.3 mg/dL — ABNORMAL HIGH (ref 0.61–1.24)
GFR, Estimated: 27 mL/min — ABNORMAL LOW (ref >60)
Glucose, Bld: 98 mg/dL (ref 70–99)
Potassium: 3.7 mmol/L (ref 3.5–5.1)
Sodium: 140 mmol/L (ref 135–145)

## 2022-01-16 LAB — LIPID PANEL
Cholesterol: 127 mg/dL (ref 0–200)
HDL: 35 mg/dL — ABNORMAL LOW (ref >40)
LDL Cholesterol: 59 mg/dL (ref 0–99)
Total CHOL/HDL Ratio: 3.6 RATIO
Triglycerides: 165 mg/dL — ABNORMAL HIGH (ref <150)
VLDL: 33 mg/dL (ref 0–40)

## 2022-01-16 MED ORDER — TORSEMIDE 20 MG PO TABS: ORAL_TABLET | ORAL | 5 refills | Status: DC

## 2022-01-16 MED ORDER — CARVEDILOL 6.25 MG PO TABS: 6.2500 mg | ORAL_TABLET | Freq: Two times a day (BID) | ORAL | 3 refills | Status: DC

## 2022-01-16 NOTE — Progress Notes (Signed)
ReDS Vest / Clip - 01/16/22 1500       ReDS Vest / Clip   Station Marker C    Ruler Value 31    ReDS Value Range High volume overload    ReDS Actual Value 42

## 2022-01-16 NOTE — Patient Instructions (Addendum)
TAKE Carvedilol 6.25 mg Twice daily  TAKE Torsemide 60 mg in the morning and 40 mg in the evening   Labs done today, your results will be available in MyChart, we will contact you for abnormal readings.  PLEASE CALL THE OFFICE WITH THE CORRECT CHOLESTEROL MEDICATION ONCE YOU FIND OUT WHAT IT IS.  Your physician recommends that you schedule a follow-up appointment in: 3 months.  If you have any questions or concerns before your next appointment please send [place] a message through [place] or call our office at [phone].    TO LEAVE A MESSAGE FOR THE NURSE SELECT OPTION 2, PLEASE LEAVE A MESSAGE INCLUDING: YOUR NAME DATE OF BIRTH CALL BACK NUMBER REASON FOR CALL**this is important as we prioritize the call backs  YOU WILL RECEIVE A CALL BACK THE SAME DAY AS LONG AS YOU CALL BEFORE 4:00 PM  At the [facility], you and your health needs are our priority. As part of our continuing mission to provide you with exceptional heart care, we have created designated Provider Care Teams. These Care Teams include your primary Cardiologist (physician) and Advanced Practice Providers (APPs- Physician Assistants and Nurse Practitioners) who all work together to provide you with the care you need, when you need it.   You may see any of the following providers on your designated Care Team at your next follow up: Dr Glori Bickers Dr Loralie Champagne Dr. Roxana Hires, NP Lyda Jester, [place] [facility] [place], [place] Forestine Na, NP Audry Riles, PharmD   Please be sure to bring in all your medications bottles to every appointment.

## 2022-01-17 NOTE — Progress Notes (Signed)
Date:  01/17/2022   ID:  Allen Dyer, DOB 1935/05/30, MRN [id number]    Provider location: [facility] Type of Visit: Established patient   PCP:  [name], MD  Cardiologist:  [name], MD Nephrology: Dr. Daiva Huge HF Cardiology: Dr Aundra Dubin   HPI: Allen Dyer is a 86 y.o. male with a history of DM, rheumatic fever, HTN, hyperlipidemia, CKD 3, prostate CA s/p XRT, systolic HF due to NICM (EF 30-35%) 05/2018, pulmonary venous HTN, and mild nonobstructive CAD.  He served for > 20 years in the [employer] including in [place].    Admitted 3/11-3/18/20 with volume overload. Diuresed with IV lasix. HF team consulted with concern for pulmonary HTN due to ongoing oxygen need. RHC completed and showed moderate pulmonary venous HTN with normal PVR 1.7 (no PAH). Transitioned from Lasix to torsemide 40 mg BID at DC. Oxygen weaned off. HF medications optimized as able. Limited by CKD. DC weight 213 lbs.   Patient was diagnosed with smoldering myeloma in 7/20, no treatment planned at this time.   Cardiolite in 2/21 showed EF 36%, fixed anteroapical defect, no ischemia.  Echo in 11/21 showed EF 30-35%, diffuse hypokinesis, normal RV, PASP 45 mmHg, dilated IVC.   Follow 3/23, stable NYHA II symptoms and euvolemic. Wilder Glade started and torsemide decreased to 40 mg bid. Eplerenone replaced spiro due to breast tenderness.   Seen in ED 08/05/21 with blurred vision. Felt to be lightheadedness vs vertigo. Orthostatics negative. He had PCP follow up and was diagnosed with vertigo.  CVA in 5/23, echo showed EF 35-40%, mild LVH, normal RV.  Carotid dopplers showed minimal disease.  MRA head showed severe vertebral artery disease bilaterally.   Today he returns for HF follow up. Weight up 2 lbs.  No dyspnea walking on flat ground but short of breath with stairs/inclines.  He has bendopnea.  Rare atypical chest pain. BP elevated today but SBP runs 120s-130s at home.  He thinks  he is taking a cholesterol medication but is not sure which one.   ECG (personally reviewed): NSR, RBBB, LAFB  REDS clip 42%  Labs (7/20): creatinine 2.29 => 1.97, K 3.5, hgb 11.6.  Labs (10/20): Creatinine 2.0 Labs (11/20): creatinine 2.5 Labs (1/21): K 3.7, creatinine 2.28 Labs (5/21): K 4, creatinine 2.33 Labs (8/21): K 3.9, creatinine 2.5 Labs (3/23): K 4, creatinine 2.22  Labs (4/23): K 3.3, creatinine 2.15 Labs (5/23): LDL 79 Labs (8/23): K 3.9, creatinine 2.3    PMH: 1. CKD: Stage 3.  2. Hyperlipidemia: Statin intolerant.  3. HTN 4. Smoldering myeloma: Diagnosed in 7/20.  - Bone marrow biopsy stained negative for amyloidosis.  5. Prostate cancer s/p XRT.  6. Type 2 diabetes.  7. Chronic systolic CHF: Nonischemic cardiomyopathy.  - RHC/LHC (2/20): Nonobstructive CAD with 50% pLAD; mean RA 13, PA 72/27 mean 47, mean PCWP 29, CI 2.8, PVR 3 WU - Echo (2/20): EF 30-35%.  - RHC (3/20): mean RA 9, PA 55/20 mean 32, mean PCWP 20, CI 3.29, PVR 1.7 WU - Echo (7/20): EF 25-30%, moderate LV dilation without LVH, normal RV size and systolic function, mild-moderate AI.  - Cardiolite (2/21): EF 36%, fixed anteroapical defect, no ischemia.  - Echo (11/21): EF 30-35%, diffuse hypokinesis, normal RV, PASP 45 mmHg, dilated IVC.  - Echo (3/23): EF 30-35%, moderately decreased LV with global HK, grade I DD, normal RV, ascending aorta dilation 44 mm. - Echo (5/23): EF 35-40%, mild LVH, normal RV  8. Pulmonary venous hypertension.  - V/Q scan (3/20): No evidence for chronic PE.  9. OSA: Using CPAP.  10. ABIs normal in 1/23 11. CVA: 5/23.  - Carotid dopplers (5/23): minimal disease.  - MRA head: no flow right vertebral, severe stenosis left vertebral.   Past Surgical History:  Procedure Laterality Date   RIGHT HEART CATH N/A 07/10/2018   Procedure: RIGHT HEART CATH;  Surgeon: Larey Dresser, MD;  Location: [facility];  Service: Cardiovascular;  Laterality: N/A;   RIGHT/LEFT HEART  CATH AND CORONARY ANGIOGRAPHY N/A 06/18/2018   Procedure: RIGHT/LEFT HEART CATH AND CORONARY ANGIOGRAPHY;  Surgeon: Troy Sine, MD;  Location: [facility];  Service: Cardiovascular;  Laterality: N/A;    Current Outpatient Medications  Medication Sig Dispense Refill   allopurinol (ZYLOPRIM) 100 MG tablet Take 50 mg by mouth every morning.     Ascorbic Acid (VITAMIN C) 1000 MG tablet Take 4,000 mg by mouth every morning.     aspirin 81 MG chewable tablet Chew 1 tablet (81 mg total) by mouth daily. 30 tablet 11   Blood Glucose Monitoring Suppl (FREESTYLE LITE) w/Device KIT 1 Device by Does not apply route daily in the afternoon. 1 kit 0   Cholecalciferol (VITAMIN D3 PO) Take 1 capsule by mouth every morning.     clopidogrel (PLAVIX) 75 MG tablet Take 1 tablet (75 mg total) by mouth daily. 30 tablet 2   eplerenone (INSPRA) 50 MG tablet Take 1 tablet (50 mg total) by mouth daily. (Patient taking differently: Take 50 mg by mouth every morning.) 90 tablet 3   ezetimibe (ZETIA) 10 MG tablet Take 1 tablet (10 mg total) by mouth daily. 30 tablet 3   gabapentin (NEURONTIN) 600 MG tablet Take 300 mg by mouth every morning.     Garlic 5035 MG CAPS Take 1,000 mg by mouth every morning.     glipiZIDE (GLUCOTROL) 5 MG tablet Take 1 tablet (5 mg total) by mouth 2 (two) times daily before a meal. 135 tablet 3   glucose blood (FREESTYLE LITE) test strip 1 each by Other route daily in the afternoon. Use as instructed 100 each 3   hydrALAZINE (APRESOLINE) 50 MG tablet Take 1 tablet (50 mg total) by mouth 3 (three) times daily. 6am, 2pm and 10pm (Patient taking differently: Take 25 mg by mouth 3 (three) times daily. 6am, 2pm and 10pm)     isosorbide mononitrate (IMDUR) 30 MG 24 hr tablet Take 1 tablet (30 mg total) by mouth every morning.     pantoprazole (PROTONIX) 40 MG tablet Take 1 tablet (40 mg total) by mouth daily. 30 tablet 2   potassium chloride SA (KLOR-CON M) 20 MEQ tablet Take 1 tablet (20 mEq  total) by mouth daily. (Patient taking differently: Take 20 mEq by mouth every morning.) 90 tablet 3   tamsulosin (FLOMAX) 0.4 MG CAPS capsule Take 0.4 mg by mouth 2 (two) times daily.      vitamin B-12 (CYANOCOBALAMIN) 500 MCG tablet Take 500 mcg by mouth every morning.     carvedilol (COREG) 6.25 MG tablet Take 1 tablet (6.25 mg total) by mouth 2 (two) times daily with a meal. 180 tablet 3   Multiple Vitamin (MULTIVITAMIN WITH MINERALS) TABS tablet Take 1 tablet by mouth every morning. (Patient not taking: Reported on 01/16/2022)     torsemide (DEMADEX) 20 MG tablet Take 3 tablets (60 mg total) by mouth every morning AND 2 tablets (40 mg total) every evening.  180 tablet 5   No current facility-administered medications for this encounter.    Allergies:   Tramadol, Finasteride, Lisinopril, Ciprofloxacin, Simvastatin, and Sulfa antibiotics   Social History:  The patient  reports that he quit smoking about 50 years ago. His smoking use included cigarettes. He has a 20.00 pack-year smoking history. He has never used smokeless tobacco. He reports that he does not currently use alcohol. He reports that he does not use drugs.   Family History:  The patient's family history includes Cancer in his mother and sister; Congestive Heart Failure in his brother and father.   ROS:  Please see the history of present illness.   All other systems are personally reviewed and negative.   Wt Readings from Last 3 Encounters:  01/16/22 100.2 kg (220 lb 12.8 oz)  12/19/21 100.7 kg (222 lb)  10/26/21 98 kg (216 lb)   BP (!) 150/80   Pulse 70   Wt 100.2 kg (220 lb 12.8 oz)   SpO2 98%   BMI 34.27 kg/m   Physical Exam:   General: NAD Neck: JVP 8-9 cm, no thyromegaly or thyroid nodule.  Lungs: Clear to auscultation bilaterally with normal respiratory effort. CV: Nondisplaced PMI.  Heart regular S1/S2, no S3/S4, no murmur.  1+ ankle edema.  No carotid bruit.  Normal pedal pulses.  Abdomen: Soft, nontender, no  hepatosplenomegaly, no distention.  Skin: Intact without lesions or rashes.  Neurologic: Alert and oriented x 3.  Psych: Normal affect. Extremities: No clubbing or cyanosis.  HEENT: Normal.   Recent Labs: 06/23/2021: B Natriuretic Peptide 65.5 12/13/2021: ALT 15; Hemoglobin 11.3; Platelets 161 01/16/2022: BUN 22; Creatinine, Ser 2.30; Potassium 3.7; Sodium 140  Personally reviewed   ASSESSMENT AND PLAN: 1. Chronic systolic HF:  New diagnosis 05/2018, EF 30-35% by echo. Due to NICM. LHC 05/2018 with mild non-osbtructive CAD (50% proximal LAD stenosis). Has had high BP for a long time, but it has generally been controlled recently. Had palpitations in past, but very few PVCs on telemetry when in the hospital. Prior hx of ETOH abuse, but none in 40+ years. Did not receive chemotherapy with prior prostate cancer. QRS is wide on EKG, but it is RBBB.  He has multiple myeloma but he does not have ventricular wall thickening that would be suggestive of cardiac amyloidosis.  [place] 3/20 with mildly elevated filling pressures, CO preserved, pulmonary venous HTN. No PAH.  Echo in 7/20 showed that EF remains low at 25-30% with moderate LV dilation, no LVH, and normal RV.  Echo in 11/21 showed EF 30-35%, diffuse hypokinesis, normal RV, PASP 45 mmHg, dilated IVC. Echo (3/23) EF 30-35%, echo in 5/23 showed EF 35-40%, mild LVH, normal RV.  NYHA class II symptoms but he is volume overloaded by exam and REDS clip. Med titration limited by CKD stage 3.  - Change carvedilol to 6.25 mg bid.  - Increase torsemide to 60 qam/40 qpm.  BMET today and in 10 days.  - No ACE/ARB/ARNI with CKD stage 3.  - Off SGLT2i with yeast infection. - Continue hydralazine 25 mg tid and Imdur 30 mg daily.  - Continue eplerenone 50 mg daily (breast tenderness with spiro).  - With advanced age and nonischemic etiology of cardiomyopathy, would avoid ICD.  He has RBBB so would not likely benefit from CRT.  2. CKD Stage 3: Last creatinine 2.3. BMET  today. - Sees nephrology.  3. CAD: 50% pLAD on LHC in 2/20. Cardiolite in 2/21 showed no ischemia.   -  He has not been able to tolerate statins, but LDL has been low.  He says he is on a "cholesterol medication" but does not remember what.  - Continue Plavix + ASA 81.   4. Severe OSA: Continue CPAP.  5. Pulmonary hypertension: He had pulmonary venous hypertension based on prior RHC.  6. Smoldering myeloma:  No plans for chemotherapy treatment. Sees Dr Alvy Bimler.  7. CVA: In 5/23.  He has severe vertebral artery disease.   - Continue ASA 81 and Plavix.  - Has not tolerated statins but says he is on a "cholesterol med."  He will call us to let us know which one. Check lipids today.   Follow up 6 wks with APP to reassess volume.     Signed, Loralie Champagne, MD  01/17/2022  [place] [address] [facility] [place] [address] [phone] (office) [phone] (fax)

## 2022-01-26 ENCOUNTER — Ambulatory Visit (HOSPITAL_COMMUNITY)
Admission: RE | Admit: 2022-01-26 | Discharge: 2022-01-26 | Disposition: A | Discharge status: Home / Self Care | Payer: Medicare Other | Source: Ambulatory Visit | Attending: Internal Medicine | Admitting: Internal Medicine

## 2022-01-26 DIAGNOSIS — I5042 Chronic combined systolic (congestive) and diastolic (congestive) heart failure: Secondary | ICD-10-CM | POA: Diagnosis not present

## 2022-01-27 ENCOUNTER — Ambulatory Visit: Payer: Medicare Other | Admitting: Internal Medicine

## 2022-02-09 DIAGNOSIS — E119 Type 2 diabetes mellitus without complications: Secondary | ICD-10-CM | POA: Diagnosis not present

## 2022-02-09 DIAGNOSIS — H35373 Puckering of macula, bilateral: Secondary | ICD-10-CM | POA: Diagnosis not present

## 2022-02-10 ENCOUNTER — Telehealth: Payer: Self-pay | Admitting: *Deleted

## 2022-02-10 ENCOUNTER — Encounter: Payer: Self-pay | Admitting: *Deleted

## 2022-02-10 DIAGNOSIS — I509 Heart failure, unspecified: Secondary | ICD-10-CM

## 2022-02-10 NOTE — Patient Instructions (Signed)
Visit Information  Thank you for taking time to visit with me today. Please don't hesitate to contact me if I can be of assistance to you.   Following are the goals we discussed today:   Goals Addressed               This Visit's Progress     COMPLETED: Transportation back up when needed (pt-stated)        Care Coordination Interventions: Advised patient to contact his provider's office to schedule his AWV for this year. Reviewed medications with patient and discussed adherence with all medications with no needed refills Reviewed scheduled/upcoming provider appointments including all pending appointments Care Guide referral for transportation resources Screening for signs and symptoms of depression related to chronic disease state  Assessed social determinant of health barriers          Please call the care guide team at [phone] if you need to cancel or reschedule your appointment.   If you are experiencing a Mental Health or [place] or need someone to talk to, please call the Suicide and Crisis Lifeline: 988 call the [place] National Suicide Prevention Lifeline: (234)458-4964 or TTY: (843)251-1325 TTY 938-857-0359) to talk to a trained counselor call 1-800-273-TALK (toll free, 24 hour hotline)  Patient verbalizes understanding of instructions and care plan provided today and agrees to view in [place]. Active MyChart status and patient understanding of how to access instructions and care plan via MyChart confirmed with patient.     No further follow up required: No further follow up needs presented  Raina Mina, RN Care Management Coordinator [place] Office [phone]

## 2022-02-10 NOTE — Patient Outreach (Signed)
  Care Coordination   Initial Visit Note   02/10/2022 Name: Allen Dyer MRN: [id number] DOB: 19-Aug-1935  Allen Dyer is a 86 y.o. year old male who sees Koirala, Dibas, MD for primary care. I spoke with  Fara Chute by phone today.  What matters to the patients health and wellness today?  transportation    Goals Addressed               This Visit's Progress     COMPLETED: Transportation back up when needed (pt-stated)        Care Coordination Interventions: Advised patient to contact his provider's office to schedule his AWV for this year. Reviewed medications with patient and discussed adherence with all medications with no needed refills Reviewed scheduled/upcoming provider appointments including all pending appointments Care Guide referral for transportation resources Screening for signs and symptoms of depression related to chronic disease state  Assessed social determinant of health barriers          SDOH assessments and interventions completed:  Yes  SDOH Interventions Today    Flowsheet Row Most Recent Value  SDOH Interventions   Food Insecurity Interventions Intervention Not Indicated  Housing Interventions Intervention Not Indicated  Transportation Interventions Other (Comment)  [Request a back source of transportation if needed in the future.]  Utilities Interventions Intervention Not Indicated        Care Coordination Interventions Activated:  Yes  Care Coordination Interventions:  Yes, provided   Follow up plan: No further intervention required.   Encounter Outcome:  Pt. Visit Completed   Raina Mina, RN Care Management Coordinator [place] Office [phone]

## 2022-02-13 ENCOUNTER — Telehealth: Payer: Self-pay

## 2022-02-13 NOTE — Telephone Encounter (Signed)
   Telephone encounter was:  Successful.  02/13/2022 Name: Allen Dyer MRN: [id number] DOB: [birth year]  Allen Dyer is a 86 y.o. year old male who is a primary care patient of Koirala, Dibas, MD . The community resource team was consulted for assistance with Transportation Needs   Care guide performed the following interventions: Patient provided with information about care guide support team and interviewed to confirm resource needs.Patient stated he needs back up resources for transportation to be mailed to him  Follow Up Plan:  No further follow up planned at this time. The patient has been provided with needed resources.    [place], Care Management  [phone] [address], [place], [place] [address] Phone: [phone] Email: [email or web address]

## 2022-02-27 ENCOUNTER — Encounter (HOSPITAL_COMMUNITY): Payer: Medicare Other

## 2022-02-27 ENCOUNTER — Encounter: Payer: Self-pay | Admitting: Podiatry

## 2022-02-27 ENCOUNTER — Ambulatory Visit (INDEPENDENT_AMBULATORY_CARE_PROVIDER_SITE_OTHER): Payer: Medicare Other | Admitting: Podiatry

## 2022-02-27 DIAGNOSIS — N183 Chronic kidney disease, stage 3 unspecified: Secondary | ICD-10-CM | POA: Diagnosis not present

## 2022-02-27 DIAGNOSIS — M79675 Pain in left toe(s): Secondary | ICD-10-CM | POA: Diagnosis not present

## 2022-02-27 DIAGNOSIS — E1142 Type 2 diabetes mellitus with diabetic polyneuropathy: Secondary | ICD-10-CM

## 2022-02-27 DIAGNOSIS — M79674 Pain in right toe(s): Secondary | ICD-10-CM

## 2022-02-27 DIAGNOSIS — E1159 Type 2 diabetes mellitus with other circulatory complications: Secondary | ICD-10-CM | POA: Diagnosis not present

## 2022-02-27 DIAGNOSIS — B351 Tinea unguium: Secondary | ICD-10-CM | POA: Diagnosis not present

## 2022-02-27 NOTE — Progress Notes (Unsigned)
This patient returns to my office for at risk foot care.  This patient requires this care by a professional since this patient will be at risk due to having  Diabetes and chronic kidney disease.   This patient is unable to cut nails himself  since the patient cannot reach his  nails.These nails are painful walking and wearing shoes.  This patient presents for at risk foot care today.    General Appearance  Alert, conversant and in no acute stress.  Vascular  Dorsalis pedis and posterior tibial  pulses are palpable  bilaterally.  Capillary return is within normal limits  bilaterally. Temperature is within normal limits  bilaterally.  Neurologic  Senn-Weinstein monofilament wire test diminished   bilaterally. Muscle power within normal limits bilaterally.  Nails Thick disfigured discolored nails with subungual debris  from hallux to fifth toes bilaterally. No evidence of bacterial infection or drainage bilaterally.  Orthopedic  No limitations of motion  feet .  No crepitus or effusions noted.  No bony pathology or digital deformities noted.  Arthritis right foot/ankle.  Skin  normotropic skin with no porokeratosis noted bilaterally.  No signs of infections or ulcers noted.   Asymptomatic porokeratosis sub 5th  B/L.  Onychomycosis  Pain in right toes  Pain in left toes  Consent was obtained for treatment procedures.   Mechanical debridement of nails 1-5  bilaterally performed with a nail nipper.  Filed with dremel without incident. No infection or ulcer.     Return office visit   3 months       Told patient to return for periodic foot care and evaluation due to potential at risk complications.   Gardiner Barefoot DPM

## 2022-02-28 ENCOUNTER — Encounter: Payer: Self-pay | Admitting: Podiatry

## 2022-02-28 NOTE — Progress Notes (Signed)
Date:  03/01/2022   ID:  Fara Chute, DOB 1936-01-06, MRN [id number]    Provider location: [facility] Type of Visit: Established patient   PCP:  [name], MD  Cardiologist:  [name], MD Nephrology: Dr. Daiva Huge HF Cardiology: Dr Aundra Dubin   HPI: Allen Dyer is a 86 y.o. male with a history of DM, rheumatic fever, HTN, hyperlipidemia, CKD 3, prostate CA s/p XRT, systolic HF due to NICM (EF 30-35%) 05/2018, pulmonary venous HTN, and mild nonobstructive CAD.  He served for > 20 years in the [employer] including in [place].    Admitted 3/11-3/18/20 with volume overload. Diuresed with IV lasix. HF team consulted with concern for pulmonary HTN due to ongoing oxygen need. RHC completed and showed moderate pulmonary venous HTN with normal PVR 1.7 (no PAH). Transitioned from Lasix to torsemide 40 mg BID at DC. Oxygen weaned off. HF medications optimized as able. Limited by CKD. DC weight 213 lbs.   Patient was diagnosed with smoldering myeloma in 7/20, no treatment planned at this time.   Cardiolite in 2/21 showed EF 36%, fixed anteroapical defect, no ischemia.  Echo in 11/21 showed EF 30-35%, diffuse hypokinesis, normal RV, PASP 45 mmHg, dilated IVC.   Follow 3/23, stable NYHA II symptoms and euvolemic. Wilder Glade started and torsemide decreased to 40 mg bid. Eplerenone replaced spiro due to breast tenderness.   Seen in ED 08/05/21 with blurred vision. Felt to be lightheadedness vs vertigo. Orthostatics negative. He had PCP follow up and was diagnosed with vertigo.  CVA in 5/23, echo showed EF 35-40%, mild LVH, normal RV.  Carotid dopplers showed minimal disease.  MRA head showed severe vertebral artery disease bilaterally.   Today he returns for HF follow up. Last visit he was mildly volume overloaded and torsemide increased.  Overall feeling great. He has mild SOB kneeling down but no dyspnea with stairs or walking on flat ground. Denies palpitations,  abnormal bleeding, CP, dizziness, edema, or PND/Orthopnea. Appetite ok. No fever or chills. Weight at home 209-212 pounds. Taking all medications. Wears CPAP at night. BP at home 119/70. Struggles with urinary hesitancy, follows with Urology.  ECG (personally reviewed): none ordered today.  Labs (7/20): creatinine 2.29 => 1.97, K 3.5, hgb 11.6.  Labs (10/20): Creatinine 2.0 Labs (11/20): creatinine 2.5 Labs (1/21): K 3.7, creatinine 2.28 Labs (5/21): K 4, creatinine 2.33 Labs (8/21): K 3.9, creatinine 2.5 Labs (3/23): K 4, creatinine 2.22  Labs (4/23): K 3.3, creatinine 2.15 Labs (5/23): LDL 79 Labs (8/23): K 3.9, creatinine 2.3 Labs (9/23): K 3.7, creatinine 2.3, LDL 59    PMH: 1. CKD: Stage 3.  2. Hyperlipidemia: Statin intolerant.  3. HTN 4. Smoldering myeloma: Diagnosed in 7/20.  - Bone marrow biopsy stained negative for amyloidosis.  5. Prostate cancer s/p XRT.  6. Type 2 diabetes.  7. Chronic systolic CHF: Nonischemic cardiomyopathy.  - RHC/LHC (2/20): Nonobstructive CAD with 50% pLAD; mean RA 13, PA 72/27 mean 47, mean PCWP 29, CI 2.8, PVR 3 WU - Echo (2/20): EF 30-35%.  - RHC (3/20): mean RA 9, PA 55/20 mean 32, mean PCWP 20, CI 3.29, PVR 1.7 WU - Echo (7/20): EF 25-30%, moderate LV dilation without LVH, normal RV size and systolic function, mild-moderate AI.  - Cardiolite (2/21): EF 36%, fixed anteroapical defect, no ischemia.  - Echo (11/21): EF 30-35%, diffuse hypokinesis, normal RV, PASP 45 mmHg, dilated IVC.  - Echo (3/23): EF 30-35%, moderately decreased LV with  global HK, grade I DD, normal RV, ascending aorta dilation 44 mm. - Echo (5/23): EF 35-40%, mild LVH, normal RV 8. Pulmonary venous hypertension.  - V/Q scan (3/20): No evidence for chronic PE.  9. OSA: Using CPAP.  10. ABIs normal in 1/23 11. CVA: 5/23.  - Carotid dopplers (5/23): minimal disease.  - MRA head: no flow right vertebral, severe stenosis left vertebral.   Past Surgical History:  Procedure  Laterality Date   RIGHT HEART CATH N/A 07/10/2018   Procedure: RIGHT HEART CATH;  Surgeon: Larey Dresser, MD;  Location: [facility];  Service: Cardiovascular;  Laterality: N/A;   RIGHT/LEFT HEART CATH AND CORONARY ANGIOGRAPHY N/A 06/18/2018   Procedure: RIGHT/LEFT HEART CATH AND CORONARY ANGIOGRAPHY;  Surgeon: Troy Sine, MD;  Location: [facility];  Service: Cardiovascular;  Laterality: N/A;    Current Outpatient Medications  Medication Sig Dispense Refill   allopurinol (ZYLOPRIM) 100 MG tablet Take 50 mg by mouth every morning.     Ascorbic Acid (VITAMIN C) 1000 MG tablet Take 4,000 mg by mouth every morning.     aspirin 81 MG chewable tablet Chew 1 tablet (81 mg total) by mouth daily. 30 tablet 11   Blood Glucose Monitoring Suppl (FREESTYLE LITE) w/Device KIT 1 Device by Does not apply route daily in the afternoon. 1 kit 0   carvedilol (COREG) 6.25 MG tablet Take 1 tablet (6.25 mg total) by mouth 2 (two) times daily with a meal. 180 tablet 3   Cholecalciferol (VITAMIN D3 PO) Take 1 capsule by mouth every morning.     clopidogrel (PLAVIX) 75 MG tablet Take 1 tablet (75 mg total) by mouth daily. 30 tablet 2   eplerenone (INSPRA) 50 MG tablet Take 1 tablet (50 mg total) by mouth daily. (Patient taking differently: Take 50 mg by mouth every morning.) 90 tablet 3   ezetimibe (ZETIA) 10 MG tablet Take 1 tablet (10 mg total) by mouth daily. 30 tablet 3   gabapentin (NEURONTIN) 600 MG tablet Take 300 mg by mouth every morning.     Garlic 2952 MG CAPS Take 1,000 mg by mouth every morning.     glipiZIDE (GLUCOTROL) 5 MG tablet Take 1 tablet (5 mg total) by mouth 2 (two) times daily before a meal. 135 tablet 3   glucose blood (FREESTYLE LITE) test strip 1 each by Other route daily in the afternoon. Use as instructed 100 each 3   hydrALAZINE (APRESOLINE) 50 MG tablet Take 1 tablet (50 mg total) by mouth 3 (three) times daily. 6am, 2pm and 10pm (Patient taking differently: Take 25 mg  by mouth 3 (three) times daily. 6am, 2pm and 10pm)     isosorbide mononitrate (IMDUR) 30 MG 24 hr tablet Take 1 tablet (30 mg total) by mouth every morning.     Multiple Vitamin (MULTIVITAMIN WITH MINERALS) TABS tablet Take 1 tablet by mouth every morning.     pantoprazole (PROTONIX) 40 MG tablet Take 1 tablet (40 mg total) by mouth daily. 30 tablet 2   potassium chloride SA (KLOR-CON M) 20 MEQ tablet Take 1 tablet (20 mEq total) by mouth daily. (Patient taking differently: Take 20 mEq by mouth every morning.) 90 tablet 3   tamsulosin (FLOMAX) 0.4 MG CAPS capsule Take 0.4 mg by mouth 2 (two) times daily.      torsemide (DEMADEX) 20 MG tablet Take 3 tablets (60 mg total) by mouth every morning AND 2 tablets (40 mg total) every evening. 180 tablet  5   vitamin B-12 (CYANOCOBALAMIN) 500 MCG tablet Take 500 mcg by mouth every morning.     No current facility-administered medications for this encounter.    Allergies:   Tramadol, Finasteride, Lisinopril, Ciprofloxacin, Simvastatin, and Sulfa antibiotics   Social History:  The patient  reports that he quit smoking about 50 years ago. His smoking use included cigarettes. He has a 20.00 pack-year smoking history. He has never used smokeless tobacco. He reports that he does not currently use alcohol. He reports that he does not use drugs.   Family History:  The patient's family history includes Cancer in his mother and sister; Congestive Heart Failure in his brother and father.   ROS:  Please see the history of present illness.   All other systems are personally reviewed and negative.   Wt Readings from Last 3 Encounters:  03/01/22 98.2 kg (216 lb 9.6 oz)  01/16/22 100.2 kg (220 lb 12.8 oz)  12/19/21 100.7 kg (222 lb)   BP (!) 140/80   Pulse 66   Wt 98.2 kg (216 lb 9.6 oz)   SpO2 94%   BMI 33.62 kg/m   Physical Exam:   General:  NAD. No resp difficulty, walked into clinic HEENT: Normal Neck: Supple. No JVD. Carotids 2+ bilat; no bruits. No  lymphadenopathy or thryomegaly appreciated. Cor: PMI nondisplaced. Regular rate & rhythm. No rubs, gallops or murmurs. Lungs: Clear Abdomen: Soft, nontender, nondistended. No hepatosplenomegaly. No bruits or masses. Good bowel sounds. Extremities: No cyanosis, clubbing, rash, edema Neuro: Alert & oriented x 3, cranial nerves grossly intact. Moves all 4 extremities w/o difficulty. Affect pleasant.   Recent Labs: 06/23/2021: B Natriuretic Peptide 65.5 12/13/2021: ALT 15; Hemoglobin 11.3; Platelets 161 01/16/2022: BUN 22; Creatinine, Ser 2.30; Potassium 3.7; Sodium 140  Personally reviewed   ASSESSMENT AND PLAN: 1. Chronic systolic HF:  New diagnosis 05/2018, EF 30-35% by echo. Due to NICM. LHC 05/2018 with mild non-osbtructive CAD (50% proximal LAD stenosis). Has had high BP for a long time, but it has generally been controlled recently. Had palpitations in past, but very few PVCs on telemetry when in the hospital. Prior hx of ETOH abuse, but none in 40+ years. Did not receive chemotherapy with prior prostate cancer. QRS is wide on EKG, but it is RBBB.  He has multiple myeloma but he does not have ventricular wall thickening that would be suggestive of cardiac amyloidosis.  [place] 3/20 with mildly elevated filling pressures, CO preserved, pulmonary venous HTN. No PAH.  Echo in 7/20 showed that EF remains low at 25-30% with moderate LV dilation, no LVH, and normal RV.  Echo in 11/21 showed EF 30-35%, diffuse hypokinesis, normal RV, PASP 45 mmHg, dilated IVC. Echo (3/23) EF 30-35%, echo in 5/23 showed EF 35-40%, mild LVH, normal RV.  NYHA class II symptoms. He is not volume overloaded on exam, weight down 4 lbs. GDMT limited by CKD stage 3.  - Continue carvedilol 6.25 mg bid.  - Continue torsemide 60 qam/40 qpm.  BMET today. - Continue hydralazine 25 mg tid and Imdur 30 mg daily.  - Continue eplerenone 50 mg daily (breast tenderness with spiro).  - No ACE/ARB/ARNI with CKD stage 3.  - Off SGLT2i with yeast  infection. - With advanced age and nonischemic etiology of cardiomyopathy, would avoid ICD.  He has RBBB so would not likely benefit from CRT.  2. CKD Stage 3: Last creatinine 2.3. BMET today. - Sees nephrology.  3. CAD: 50% pLAD on LHC in 2/20.  Cardiolite in 2/21 showed no ischemia.  No chest pain. - Continue Zetia. Good lipids 9/23. - Continue Plavix + ASA 81.   4. Severe OSA: Continue CPAP.  5. Pulmonary hypertension: He had pulmonary venous hypertension based on prior RHC.  6. Smoldering myeloma:  No plans for chemotherapy treatment. Sees Dr Alvy Bimler.  7. CVA: In 5/23.  He has severe vertebral artery disease.   - Continue ASA 81 and Plavix.  - Continue Zetia (he has not tolerate statins).   Follow up in 6 months with Dr. Aundra Dubin.    Signed, Rafael Bihari, FNP  03/01/2022  [facility] [address] [facility] [place] [address] [phone] (office) [phone] (fax)

## 2022-03-01 ENCOUNTER — Ambulatory Visit (HOSPITAL_COMMUNITY)
Admission: RE | Admit: 2022-03-01 | Discharge: 2022-03-01 | Disposition: A | Discharge status: Home / Self Care | Payer: Medicare Other | Source: Ambulatory Visit | Attending: Physician Assistant | Admitting: Physician Assistant

## 2022-03-01 ENCOUNTER — Encounter (HOSPITAL_COMMUNITY): Payer: Self-pay

## 2022-03-01 VITALS — BP 140/80 | HR 66 | Wt 216.6 lb

## 2022-03-01 DIAGNOSIS — N183 Chronic kidney disease, stage 3 unspecified: Secondary | ICD-10-CM | POA: Diagnosis not present

## 2022-03-01 DIAGNOSIS — G4733 Obstructive sleep apnea (adult) (pediatric): Secondary | ICD-10-CM

## 2022-03-01 DIAGNOSIS — Z79899 Other long term (current) drug therapy: Secondary | ICD-10-CM | POA: Diagnosis not present

## 2022-03-01 DIAGNOSIS — I4510 Unspecified right bundle-branch block: Secondary | ICD-10-CM | POA: Insufficient documentation

## 2022-03-01 DIAGNOSIS — I130 Hypertensive heart and chronic kidney disease with heart failure and stage 1 through stage 4 chronic kidney disease, or unspecified chronic kidney disease: Secondary | ICD-10-CM | POA: Insufficient documentation

## 2022-03-01 DIAGNOSIS — Z7902 Long term (current) use of antithrombotics/antiplatelets: Secondary | ICD-10-CM | POA: Insufficient documentation

## 2022-03-01 DIAGNOSIS — I5022 Chronic systolic (congestive) heart failure: Secondary | ICD-10-CM | POA: Diagnosis not present

## 2022-03-01 DIAGNOSIS — D472 Monoclonal gammopathy: Secondary | ICD-10-CM

## 2022-03-01 DIAGNOSIS — C9000 Multiple myeloma not having achieved remission: Secondary | ICD-10-CM | POA: Diagnosis not present

## 2022-03-01 DIAGNOSIS — Z8673 Personal history of transient ischemic attack (TIA), and cerebral infarction without residual deficits: Secondary | ICD-10-CM | POA: Diagnosis not present

## 2022-03-01 DIAGNOSIS — I428 Other cardiomyopathies: Secondary | ICD-10-CM | POA: Diagnosis not present

## 2022-03-01 DIAGNOSIS — E785 Hyperlipidemia, unspecified: Secondary | ICD-10-CM | POA: Diagnosis not present

## 2022-03-01 DIAGNOSIS — Z923 Personal history of irradiation: Secondary | ICD-10-CM | POA: Insufficient documentation

## 2022-03-01 DIAGNOSIS — I10 Essential (primary) hypertension: Secondary | ICD-10-CM | POA: Diagnosis not present

## 2022-03-01 DIAGNOSIS — Z8546 Personal history of malignant neoplasm of prostate: Secondary | ICD-10-CM | POA: Diagnosis not present

## 2022-03-01 DIAGNOSIS — N1832 Chronic kidney disease, stage 3b: Secondary | ICD-10-CM | POA: Diagnosis not present

## 2022-03-01 DIAGNOSIS — E1122 Type 2 diabetes mellitus with diabetic chronic kidney disease: Secondary | ICD-10-CM | POA: Insufficient documentation

## 2022-03-01 DIAGNOSIS — I2510 Atherosclerotic heart disease of native coronary artery without angina pectoris: Secondary | ICD-10-CM | POA: Diagnosis not present

## 2022-03-01 DIAGNOSIS — F1010 Alcohol abuse, uncomplicated: Secondary | ICD-10-CM | POA: Diagnosis not present

## 2022-03-01 DIAGNOSIS — I779 Disorder of arteries and arterioles, unspecified: Secondary | ICD-10-CM | POA: Insufficient documentation

## 2022-03-01 DIAGNOSIS — I272 Pulmonary hypertension, unspecified: Secondary | ICD-10-CM

## 2022-03-01 LAB — BASIC METABOLIC PANEL
Anion gap: 4 — ABNORMAL LOW (ref 5–15)
BUN: 25 mg/dL — ABNORMAL HIGH (ref 8–23)
CO2: 29 mmol/L (ref 22–32)
Calcium: 9.3 mg/dL (ref 8.9–10.3)
Chloride: 106 mmol/L (ref 98–111)
Creatinine, Ser: 2.22 mg/dL — ABNORMAL HIGH (ref 0.61–1.24)
GFR, Estimated: 29 mL/min — ABNORMAL LOW (ref >60)
Glucose, Bld: 153 mg/dL — ABNORMAL HIGH (ref 70–99)
Potassium: 4 mmol/L (ref 3.5–5.1)
Sodium: 139 mmol/L (ref 135–145)

## 2022-03-01 NOTE — Patient Instructions (Signed)
It was great to see you today! No medication changes are needed at this time.   Labs today We will only contact you if something comes back abnormal or we need to make some changes. Otherwise no news is good news!   Your physician wants you to follow-up in: 6 months with Dr Aundra Dubin. You will receive a reminder letter in the mail two months in advance. If you don't receive a letter, please call our office to schedule the follow-up appointment.    Do the following things EVERYDAY: Weigh yourself in the morning before breakfast. Write it down and keep it in a log. Take your medicines as prescribed Eat low salt foods--Limit salt (sodium) to 2000 mg per day.  Stay as active as you can everyday Limit all fluids for the day to less than 2 liters  At the [facility], you and your health needs are our priority. As part of our continuing mission to provide you with exceptional heart care, we have created designated Provider Care Teams. These Care Teams include your primary Cardiologist (physician) and Advanced Practice Providers (APPs- Physician Assistants and Nurse Practitioners) who all work together to provide you with the care you need, when you need it.   You may see any of the following providers on your designated Care Team at your next follow up: Dr Glori Bickers Dr Loralie Champagne Dr. Roxana Hires, NP Lyda Jester, [place] [facility], [place] Forestine Na, NP Audry Riles, PharmD   Please be sure to bring in all your medications bottles to every appointment.

## 2022-03-07 DIAGNOSIS — H6123 Impacted cerumen, bilateral: Secondary | ICD-10-CM | POA: Diagnosis not present

## 2022-04-19 ENCOUNTER — Encounter (HOSPITAL_COMMUNITY): Payer: Medicare Other

## 2022-04-26 ENCOUNTER — Encounter: Payer: Self-pay | Admitting: Internal Medicine

## 2022-04-26 ENCOUNTER — Ambulatory Visit (INDEPENDENT_AMBULATORY_CARE_PROVIDER_SITE_OTHER): Payer: Medicare Other | Admitting: Internal Medicine

## 2022-04-26 VITALS — BP 122/78 | HR 57 | Ht 67.0 in | Wt 209.0 lb

## 2022-04-26 DIAGNOSIS — E1159 Type 2 diabetes mellitus with other circulatory complications: Secondary | ICD-10-CM | POA: Diagnosis not present

## 2022-04-26 DIAGNOSIS — N184 Chronic kidney disease, stage 4 (severe): Secondary | ICD-10-CM

## 2022-04-26 DIAGNOSIS — E1122 Type 2 diabetes mellitus with diabetic chronic kidney disease: Secondary | ICD-10-CM | POA: Diagnosis not present

## 2022-04-26 DIAGNOSIS — E1165 Type 2 diabetes mellitus with hyperglycemia: Secondary | ICD-10-CM

## 2022-04-26 LAB — POCT GLYCOSYLATED HEMOGLOBIN (HGB A1C): Hemoglobin A1C: 8.1 % — AB (ref 4.0–5.6)

## 2022-04-26 MED ORDER — GLIPIZIDE 5 MG PO TABS: 5.0000 mg | ORAL_TABLET | Freq: Two times a day (BID) | ORAL | 3 refills | Status: DC

## 2022-04-26 NOTE — Patient Instructions (Addendum)
  Glipizide 5 mg 1 tablet before Breakfast and 1 tablet before supper   HOW TO TREAT LOW BLOOD SUGARS (Blood sugar LESS THAN 70 MG/DL) Please follow the RULE OF 15 for the treatment of hypoglycemia treatment (when your (blood sugars are less than 70 mg/dL)   STEP 1: Take 15 grams of carbohydrates when your blood sugar is low, which includes:  3-4 GLUCOSE TABS  OR 3-4 OZ OF JUICE OR REGULAR SODA OR ONE TUBE OF GLUCOSE GEL    STEP 2: RECHECK blood sugar in 15 MINUTES STEP 3: If your blood sugar is still low at the 15 minute recheck --> then, go back to STEP 1 and treat AGAIN with another 15 grams of carbohydrates.

## 2022-04-26 NOTE — Progress Notes (Signed)
Name: Allen Dyer  Age/ Sex: 87 y.o., male   MRN/ DOB: [id number], 05/02/1935     PCP: [name], MD   Reason for Endocrinology Evaluation: Type 2 Diabetes Mellitus  Initial Endocrine Consultative Visit: 06/21/2020    PATIENT IDENTIFIER: [name] is a 87 y.o. male with a past medical history of T2DM, HTN, Neuropathy, CHF (04/2018). The patient has followed with Endocrinology clinic since 06/21/2020 for consultative assistance with management of his diabetes.  DIABETIC HISTORY:  [name] was diagnosed with DM > 30 yrs.started Rybelsus 05/2020  . His hemoglobin A1c has ranged from 6.6% in 2020, peaking at 8.1% .   SUBJECTIVE:   During the last visit (10/26/2021): A1c 6.7%      Today (04/26/2022): [name] is here for a follow up on diabetes management . He checks his blood sugars occasionally. The patient has had hypoglycemic episodes since the last clinic visit which occur in the middle of the night, he is symptomatic    Saw podiatry 02/27/2022 Continues to follow with oncology for MM with disease progression Has been noted with weight loss    Denies nausea, vomiting or  diarrhea  He has not been walking but planning on doing that     [place]:  Glipizide 5 mg ,BID    Statin: no ACE-I/ARB: no     METER DOWNLOAD SUMMARY: unable to down load  89-211 mg/dL      DIABETIC COMPLICATIONS: Microvascular complications:  Neuropathy, CKD IV Denies: retinopathy Last Eye Exam: Completed 10/2020  Macrovascular complications:  CHF, CVA 08/2021 Denies: CAD, PVD   HISTORY:  Past Medical History:  Past Medical History:  Diagnosis Date   Chronic combined systolic and diastolic CHF (congestive heart failure) ([place])    CKD (chronic kidney disease), stage III ([place])    Hyperlipidemia    Hypertension    Lower extremity edema    Mild CAD    a. mild-mod by cath 05/2018.   Multiple myeloma ([place]) 11/15/2018   NICM (nonischemic cardiomyopathy) ([place])     Peripheral neuropathy    Prostate cancer ([place])    Status post XRT   Rheumatic fever    Trifascicular block    Type 2 diabetes mellitus ([place])    Past Surgical History:  Past Surgical History:  Procedure Laterality Date   RIGHT HEART CATH N/A 07/10/2018   Procedure: RIGHT HEART CATH;  Surgeon: Larey Dresser, MD;  Location: [facility];  Service: Cardiovascular;  Laterality: N/A;   RIGHT/LEFT HEART CATH AND CORONARY ANGIOGRAPHY N/A 06/18/2018   Procedure: RIGHT/LEFT HEART CATH AND CORONARY ANGIOGRAPHY;  Surgeon: Troy Sine, MD;  Location: [facility];  Service: Cardiovascular;  Laterality: N/A;   Social History:  reports that he quit smoking about 51 years ago. His smoking use included cigarettes. He has a 20.00 pack-year smoking history. He has never used smokeless tobacco. He reports that he does not currently use alcohol. He reports that he does not use drugs. Family History:  Family History  Problem Relation Age of Onset   Cancer Mother    Congestive Heart Failure Father        died from heart failure at the age of 23   Cancer Sister    Congestive Heart Failure Brother        died from heart failure at the age of 53   Stroke Neg Hx      HOME MEDICATIONS: Allergies as of 04/26/2022  Reactions   Tramadol Other (See Comments)   Dizziness (intolerance) hallucinate   Finasteride Swelling   Breast enlargement   Lisinopril Cough   Ciprofloxacin Other (See Comments)   Dizziness (intolerance)   Simvastatin Hives, Other (See Comments)   Blisters/ Rash   Sulfa Antibiotics Rash        Medication List        Accurate as of April 26, 2022 11:01 AM. If you have any questions, ask your nurse or doctor.          allopurinol 100 MG tablet Commonly known as: ZYLOPRIM Take 50 mg by mouth every morning.   Aspirin Low Dose 81 MG chewable tablet Generic drug: aspirin Chew 1 tablet (81 mg total) by mouth daily.   carvedilol 6.25 MG tablet Commonly  known as: COREG Take 1 tablet (6.25 mg total) by mouth 2 (two) times daily with a meal.   clopidogrel 75 MG tablet Commonly known as: PLAVIX Take 1 tablet (75 mg total) by mouth daily.   cyanocobalamin 500 MCG tablet Commonly known as: VITAMIN B12 Take 500 mcg by mouth every morning.   eplerenone 50 MG tablet Commonly known as: INSPRA Take 1 tablet (50 mg total) by mouth daily. What changed: when to take this   ezetimibe 10 MG tablet Commonly known as: ZETIA Take 1 tablet (10 mg total) by mouth daily.   FREESTYLE LITE test strip Generic drug: glucose blood 1 each by Other route daily in the afternoon. Use as instructed   FreeStyle Lite w/Device Kit 1 Device by Does not apply route daily in the afternoon.   gabapentin 600 MG tablet Commonly known as: NEURONTIN Take 300 mg by mouth every morning.   Garlic 4235 MG Caps Take 1,000 mg by mouth every morning.   glipiZIDE 5 MG tablet Commonly known as: GLUCOTROL Take 1 tablet (5 mg total) by mouth 2 (two) times daily before a meal.   hydrALAZINE 50 MG tablet Commonly known as: APRESOLINE Take 1 tablet (50 mg total) by mouth 3 (three) times daily. 6am, 2pm and 10pm What changed: how much to take   isosorbide mononitrate 30 MG 24 hr tablet Commonly known as: IMDUR Take 1 tablet (30 mg total) by mouth every morning.   multivitamin with minerals Tabs tablet Take 1 tablet by mouth every morning.   pantoprazole 40 MG tablet Commonly known as: PROTONIX Take 1 tablet (40 mg total) by mouth daily.   potassium chloride SA 20 MEQ tablet Commonly known as: KLOR-CON M Take 1 tablet (20 mEq total) by mouth daily. What changed: when to take this   tamsulosin 0.4 MG Caps capsule Commonly known as: FLOMAX Take 0.4 mg by mouth 2 (two) times daily.   torsemide 20 MG tablet Commonly known as: DEMADEX Take 3 tablets (60 mg total) by mouth every morning AND 2 tablets (40 mg total) every evening.   vitamin C 1000 MG tablet Take  4,000 mg by mouth every morning.   VITAMIN D3 PO Take 1 capsule by mouth every morning.         OBJECTIVE:   Vital Signs:BP 122/78 (BP Location: Left Arm, Patient Position: Sitting, Cuff Size: Large)   Pulse (!) 57   Ht _0  (1.702 m)   Wt 209 lb (94.8 kg)   SpO2 96%   BMI 32.73 kg/m   Wt Readings from Last 3 Encounters:  04/26/22 209 lb (94.8 kg)  03/01/22 216 lb 9.6 oz (98.2 kg)  01/16/22 220 lb 12.8  oz (100.2 kg)     Exam: General: Pt appears well and is in NAD  Lungs: Clear with good BS bilat   Heart: RRR   Extremities: Trace  pretibial edema. .  Neuro: MS is good with appropriate affect, pt is alert and Ox3    DM foot exam: 03/30/2021  The skin of the feet is without sores or ulcerations.Toe nails discolored and dystrophic The pedal pulses are undetectable on today's exam  The sensation is decreased  to a screening 5.07, 10 gram monofilament bilaterally    DATA REVIEWED:  Lab Results  Component Value Date   HGBA1C 8.1 (A) 04/26/2022   HGBA1C 6.7 (H) 09/12/2021   HGBA1C 9.0 (A) 03/30/2021    Latest Reference Range & Units 03/01/22 16:41  Sodium 135 - 145 mmol/L 139  Potassium 3.5 - 5.1 mmol/L 4.0  Chloride 98 - 111 mmol/L 106  CO2 22 - 32 mmol/L 29  Glucose 70 - 99 mg/dL 153 (H)  BUN 8 - 23 mg/dL 25 (H)  Creatinine 0.61 - 1.24 mg/dL 2.22 (H)  Calcium 8.9 - 10.3 mg/dL 9.3  Anion gap 5 - 15  4 (L)  GFR, Estimated >60 mL/min 29 (L)     ASSESSMENT / PLAN / RECOMMENDATIONS:   1) Type 2 Diabetes Mellitus, Sub-OPtimally controlled, With CKD IV, neuropathic and macrovascular complications - Most recent A1c of 8.1  %. Goal A1c < 7.5 %.    -Patient has been noted with worsening glycemic control, this is most likely due to postprandial hyperglycemia -We discussed adding Rybelsus again to his regimen but the patient is cost conscious as taking Rybelsus pushes him in the donut hole by the end of the year -Patient has opted to avoid sugar sweetened  beverages, reduce CHO intake and start exercising -No changes at this time  MEDICATIONS: - Continue  Glipizide 5 mg , 1 tablet before Breakfast  and  tablet before Supper      EDUCATION / INSTRUCTIONS: BG monitoring instructions: Patient is instructed to check his blood sugars 1 times a day, fasting . Call [facility] if: BG persistently < 70 . I reviewed the Rule of 15 for the treatment of hypoglycemia in detail with the patient. Literature supplied.     2) Diabetic complications:  Eye: Does not have known diabetic retinopathy.  Neuro/ Feet: Does have known diabetic peripheral neuropathy .  Renal: Patient does  have known baseline CKD. He   is not on an ACEI/ARB at present. Will defer to nephrology     F/U in 6 months    Signed electronically by: Mack Guise, MD  [facility]  [facility] [address], [place] [address] Phone: [phone] FAX: [phone]   CC: [name], [address] [place] [address] Phone: [phone]  Fax: [phone]  Return to Endocrinology clinic as below: Future Appointments  Date Time Provider [place]  04/26/2022  1:00 PM Caelan Atchley, Melanie Crazier, MD [facility] None  05/30/2022  2:15 PM Gardiner Barefoot, DPM [facility]  12/11/2022 11:00 AM [facility] None  12/21/2022 11:00 AM Heath Lark, MD [facility] None

## 2022-05-07 NOTE — Progress Notes (Unsigned)
Virtual Visit via Video  Note   This visit type was conducted due to national recommendations for restrictions regarding the COVID-19 Pandemic (e.g. social distancing) in an effort to limit this patient's exposure and mitigate transmission in our community.  Due to his co-morbid illnesses, this patient is at least at moderate risk for complications without adequate follow up.  This format is felt to be most appropriate for this patient at this time.  All issues noted in this document were discussed and addressed.  A limited physical exam was performed with this format.  Please refer to the patient's chart for his consent to telehealth for [facility].   Evaluation Performed:  Follow-up visit  Date:  05/08/2022   ID:  Allen Dyer, DOB [birth year], MRN [id number]  Patient Location:  Home  Provider location:   [place]  PCP:  [name], MD  Cardiologist:  [name], MD/[name], MD Electrophysiologist:  None   Chief Complaint:  OSA and HTN  History of Present Illness:    Allen Dyer is a 87 y.o. male who presents via audio/video conferencing for a telehealth visit today.    This is a 87yo male with a history of type 2 diabetes mellitus, rheumatic fever, hypertension, peripheral neuropathy with edema, hyperlipidemia, chronic CKD stage III, prostate CA status post XRT.   2D echo 05/29/2018 showed moderate to severe LV dysfunction with EF 30 to 35% with moderately dilated LV and grade 2 diastolic dysfunction, moderate pulmonary hypertension with PASP 59 mmHg.   He underwent R/L heart cath showing mildly elevated right and left heart filling pressures with moderate pulmonary venous HTN with normal PVR 1.7 and preserved CO. He had nonobstructive CAD with 50% pLADHe was changed form Lasix to Torsemide '40mg'$  BID.   Repeat 2D echo on guideline directed HF therapy revealed persistent LV dysfunction with EF 25-30% and grade 2 DD and mild to moderate AR.  He is followed in [facility]  clinic.     He was dx with severe OSA with an AHI of 59/hr and nocturnal hypoxemia with O2 desats as low as 63% and was titrated and now on BiPAP at 19/15cm H2O.  He ultimately was changed to auto BiPAP with IPAP max 20cm H2O, EPAP min 5cm H2O and PS 4cm H2O.    He is doing well with his PAP device and thinks that he has gotten used to it.  he tolerates the mask and feels the pressure is adequate.  Since going on PAP he feels rested in the am and has no significant daytime sleepiness.  He denies any significant mouth or nasal dryness or nasal congestion.  He does not think that he snores.     Prior CV studies:   The following studies were reviewed today:  PAP compliance download  Past Medical History:  Diagnosis Date   Chronic combined systolic and diastolic CHF (congestive heart failure) ([facility])    CKD (chronic kidney disease), stage III ([facility])    Hyperlipidemia    Hypertension    Lower extremity edema    Mild CAD    a. mild-mod by cath 05/2018.   Multiple myeloma ([place]) 11/15/2018   NICM (nonischemic cardiomyopathy) ([place])    Peripheral neuropathy    Prostate cancer ([facility])    Status post XRT   Rheumatic fever    Trifascicular block    Type 2 diabetes mellitus ([place])    Past Surgical History:  Procedure Laterality Date   RIGHT HEART CATH N/A 07/10/2018  Procedure: RIGHT HEART CATH;  Surgeon: Larey Dresser, MD;  Location: [facility];  Service: Cardiovascular;  Laterality: N/A;   RIGHT/LEFT HEART CATH AND CORONARY ANGIOGRAPHY N/A 06/18/2018   Procedure: RIGHT/LEFT HEART CATH AND CORONARY ANGIOGRAPHY;  Surgeon: Troy Sine, MD;  Location: [facility];  Service: Cardiovascular;  Laterality: N/A;     No outpatient medications have been marked as taking for the 05/08/22 encounter (Video Visit) with [name], MD.     Allergies:   Tramadol, Finasteride, Lisinopril, Ciprofloxacin, Simvastatin, and Sulfa antibiotics   Social History   Tobacco Use   Smoking  status: Former    Packs/day: 2.00    Years: 10.00    Total pack years: 20.00    Types: Cigarettes    Quit date: 53    Years since quitting: 51.0   Smokeless tobacco: Never  Vaping Use   Vaping Use: Never used  Substance Use Topics   Alcohol use: Not Currently    Comment: quit in 1975, no h/o heavy use   Drug use: Never     Family Hx: The patient's family history includes Cancer in his mother and sister; Congestive Heart Failure in his brother and father. There is no history of Stroke.  ROS:   Please see the history of present illness.     All other systems reviewed and are negative.   Labs/Other Tests and Data Reviewed:    Recent Labs: 06/23/2021: B Natriuretic Peptide 65.5 12/13/2021: ALT 15; Hemoglobin 11.3; Platelets 161 03/01/2022: BUN 25; Creatinine, Ser 2.22; Potassium 4.0; Sodium 139   Recent Lipid Panel Lab Results  Component Value Date/Time   CHOL 127 01/16/2022 03:21 PM   TRIG 165 (H) 01/16/2022 03:21 PM   HDL 35 (L) 01/16/2022 03:21 PM   CHOLHDL 3.6 01/16/2022 03:21 PM   LDLCALC 59 01/16/2022 03:21 PM    Wt Readings from Last 3 Encounters:  05/08/22 207 lb 3.2 oz (94 kg)  04/26/22 209 lb (94.8 kg)  03/01/22 216 lb 9.6 oz (98.2 kg)     Objective:    Vital Signs:  BP 117/62   Pulse 71   Wt 207 lb 3.2 oz (94 kg)   BMI 32.45 kg/m   Well nourished, well developed male in no acute distress. Well appearing, alert and conversant, regular work of breathing,  good skin color  Eyes- anicteric mouth- oral mucosa is pink  neuro- grossly intact skin- no apparent rash or lesions or cyanosis ASSESSMENT & PLAN:    1. OSA - The patient is tolerating PAP therapy well without any problems. The PAP download performed by his DME was personally reviewed and interpreted by me today and showed an AHI of 4.2/hr on auto BIPAP with 97% compliance in using more than 4 hours nightly.  The patient has been using and benefiting from PAP use and will continue to benefit from  therapy.    2.  HTN -BP controlled on exam today -continue prescription drug management with Eplerenone '50mg'$  daily,  Hydralazine '25mg'$  TID and Carvedilol 18.'75mg'$  BID with PRN refills mg daily, Hydralazine '50mg'$  TID and Carvedilol 6.'25mg'$  BID with PRN refills  Patient Risk:   After full review of this patient's clinical status, I feel that they are at least moderate risk at this time.  Time:   Today, I have spent 15 minutes on telemedicine discussing medical problems including OSA, HTN and  reviewing patient's chart including PAP compliance download.  Medication Adjustments/Labs and Tests Ordered: Current  medicines are reviewed at length with the patient today.  Concerns regarding medicines are outlined above.  Tests Ordered: No orders of the defined types were placed in this encounter.   Medication Changes: No orders of the defined types were placed in this encounter.    Disposition:  Follow up in 1 year(s)  Signed, [name], MD  05/08/2022 11:31 AM    [facility]

## 2022-05-08 ENCOUNTER — Ambulatory Visit: Payer: Medicare Other | Attending: Cardiology | Admitting: Cardiology

## 2022-05-08 ENCOUNTER — Encounter: Payer: Self-pay | Admitting: Cardiology

## 2022-05-08 VITALS — BP 117/62 | HR 71 | Wt 207.2 lb

## 2022-05-08 DIAGNOSIS — I10 Essential (primary) hypertension: Secondary | ICD-10-CM | POA: Insufficient documentation

## 2022-05-08 DIAGNOSIS — G4733 Obstructive sleep apnea (adult) (pediatric): Secondary | ICD-10-CM | POA: Insufficient documentation

## 2022-05-08 NOTE — Patient Instructions (Signed)
Medication Instructions:  Your physician recommends that you continue on your current medications as directed. Please refer to the Current Medication list given to you today.  *If you need a refill on your cardiac medications before your next appointment, please call your pharmacy*   Follow-Up: At [facility], you and your health needs are our priority.  As part of our continuing mission to provide you with exceptional heart care, we have created designated Provider Care Teams.  These Care Teams include your primary Cardiologist (physician) and Advanced Practice Providers (APPs -  Physician Assistants and Nurse Practitioners) who all work together to provide you with the care you need, when you need it.   Your next appointment:   1 year(s)  Provider:   Traci Turner, MD    

## 2022-05-09 DIAGNOSIS — N2581 Secondary hyperparathyroidism of renal origin: Secondary | ICD-10-CM | POA: Diagnosis not present

## 2022-05-09 DIAGNOSIS — E1122 Type 2 diabetes mellitus with diabetic chronic kidney disease: Secondary | ICD-10-CM | POA: Diagnosis not present

## 2022-05-09 DIAGNOSIS — I129 Hypertensive chronic kidney disease with stage 1 through stage 4 chronic kidney disease, or unspecified chronic kidney disease: Secondary | ICD-10-CM | POA: Diagnosis not present

## 2022-05-09 DIAGNOSIS — I5020 Unspecified systolic (congestive) heart failure: Secondary | ICD-10-CM | POA: Diagnosis not present

## 2022-05-09 DIAGNOSIS — N184 Chronic kidney disease, stage 4 (severe): Secondary | ICD-10-CM | POA: Diagnosis not present

## 2022-05-15 ENCOUNTER — Telehealth: Payer: Self-pay

## 2022-05-15 NOTE — Telephone Encounter (Signed)
Returned his call. Requesting a copy of office notes on 12/19/21 with labs to [facility]. Printed note and labs. Left in envelope at the front desk. He is aware.

## 2022-05-30 ENCOUNTER — Ambulatory Visit (INDEPENDENT_AMBULATORY_CARE_PROVIDER_SITE_OTHER): Payer: Medicare Other | Admitting: Podiatry

## 2022-05-30 ENCOUNTER — Encounter: Payer: Self-pay | Admitting: Podiatry

## 2022-05-30 DIAGNOSIS — M79674 Pain in right toe(s): Secondary | ICD-10-CM

## 2022-05-30 DIAGNOSIS — E1159 Type 2 diabetes mellitus with other circulatory complications: Secondary | ICD-10-CM

## 2022-05-30 DIAGNOSIS — B351 Tinea unguium: Secondary | ICD-10-CM | POA: Diagnosis not present

## 2022-05-30 DIAGNOSIS — M79675 Pain in left toe(s): Secondary | ICD-10-CM

## 2022-05-30 NOTE — Progress Notes (Signed)
This patient returns to my office for at risk foot care.  This patient requires this care by a professional since this patient will be at risk due to having  Diabetes and chronic kidney disease.   This patient is unable to cut nails himself  since the patient cannot reach his  nails.These nails are painful walking and wearing shoes.  This patient presents for at risk foot care today.    General Appearance  Alert, conversant and in no acute stress.  Vascular  Dorsalis pedis and posterior tibial  pulses are palpable  bilaterally.  Capillary return is within normal limits  bilaterally. Temperature is within normal limits  bilaterally.  Neurologic  Senn-Weinstein monofilament wire test diminished   bilaterally. Muscle power within normal limits bilaterally.  Nails Thick disfigured discolored nails with subungual debris  from hallux to fifth toes bilaterally. No evidence of bacterial infection or drainage bilaterally.  Orthopedic  No limitations of motion  feet .  No crepitus or effusions noted.  No bony pathology or digital deformities noted.  Arthritis right foot/ankle.  Skin  normotropic skin with no porokeratosis noted bilaterally.  No signs of infections or ulcers noted.   Asymptomatic porokeratosis sub 5th  B/L.  Onychomycosis  Pain in right toes  Pain in left toes  Consent was obtained for treatment procedures.   Mechanical debridement of nails 1-5  bilaterally performed with a nail nipper.  Filed with dremel without incident. No infection or ulcer.     Return office visit   3 months       Told patient to return for periodic foot care and evaluation due to potential at risk complications.   Gardiner Barefoot DPM

## 2022-06-02 DIAGNOSIS — I639 Cerebral infarction, unspecified: Secondary | ICD-10-CM | POA: Diagnosis not present

## 2022-06-02 DIAGNOSIS — E113293 Type 2 diabetes mellitus with mild nonproliferative diabetic retinopathy without macular edema, bilateral: Secondary | ICD-10-CM | POA: Diagnosis not present

## 2022-06-05 ENCOUNTER — Other Ambulatory Visit (HOSPITAL_COMMUNITY): Payer: Self-pay | Admitting: Family Medicine

## 2022-06-05 DIAGNOSIS — R42 Dizziness and giddiness: Secondary | ICD-10-CM

## 2022-06-05 DIAGNOSIS — Z8673 Personal history of transient ischemic attack (TIA), and cerebral infarction without residual deficits: Secondary | ICD-10-CM

## 2022-06-05 DIAGNOSIS — H532 Diplopia: Secondary | ICD-10-CM

## 2022-06-06 ENCOUNTER — Ambulatory Visit (HOSPITAL_COMMUNITY)
Admission: RE | Admit: 2022-06-06 | Discharge: 2022-06-06 | Disposition: A | Discharge status: Home / Self Care | Payer: Medicare Other | Source: Ambulatory Visit | Attending: Family Medicine | Admitting: Family Medicine

## 2022-06-06 DIAGNOSIS — Z8673 Personal history of transient ischemic attack (TIA), and cerebral infarction without residual deficits: Secondary | ICD-10-CM | POA: Diagnosis not present

## 2022-06-06 DIAGNOSIS — R42 Dizziness and giddiness: Secondary | ICD-10-CM | POA: Diagnosis not present

## 2022-06-06 DIAGNOSIS — H532 Diplopia: Secondary | ICD-10-CM | POA: Diagnosis not present

## 2022-07-03 DIAGNOSIS — R42 Dizziness and giddiness: Secondary | ICD-10-CM | POA: Diagnosis not present

## 2022-07-03 DIAGNOSIS — Z8673 Personal history of transient ischemic attack (TIA), and cerebral infarction without residual deficits: Secondary | ICD-10-CM | POA: Diagnosis not present

## 2022-07-03 DIAGNOSIS — H532 Diplopia: Secondary | ICD-10-CM | POA: Diagnosis not present

## 2022-07-26 ENCOUNTER — Telehealth: Payer: Self-pay

## 2022-07-26 NOTE — Patient Outreach (Signed)
  Care Coordination   Initial Visit Note   07/26/2022 Name: Allen Dyer MRN: [id number] DOB: 1935/08/06  Allen Dyer is a 87 y.o. year old male who sees Koirala, Dibas, MD for primary care. I spoke with  Allen Dyer by phone today.  What matters to the patients health and wellness today?  Maintaining Health    Goals Addressed             This Visit's Progress    Health Maintenance       Patient Goals/Self Care Activities: -Patient/Caregiver will self-administer medications as prescribed as evidenced by self-report/primary caregiver report  -Patient/Caregiver will attend all scheduled provider appointments as evidenced by clinician review of documented attendance to scheduled appointments and patient/caregiver report  -Calls provider office for new concerns, questions, or BP outside discussed parameters -Checks BP and records as discussed -Follows a low sodium diet/DASH diet -check blood sugar at prescribed times -record values and write them down take them to all doctor visits  -check feet daily for cuts, sores or redness -trim toenails straight across -wash and dry feet carefully every day -wear comfortable, cotton socks -wear comfortable, well-fitting shoes -Adhere to low sodium diet   Interventions Today    Flowsheet Row Most Recent Value  Chronic Disease   Chronic disease during today's visit Diabetes  General Interventions   General Interventions Discussed/Reviewed General Interventions Discussed, Health Screening, Doctor Visits  Doctor Visits Discussed/Reviewed Doctor Visits Discussed  Health Screening Prostate, Colonoscopy  Education Interventions   Education Provided Provided Education  Provided Verbal Education On Nutrition, Medication, Blood Sugar Monitoring, When to see the doctor  Mental Health Interventions   Mental Health Discussed/Reviewed Mental Health Discussed, Depression  Nutrition Interventions   Nutrition Discussed/Reviewed Nutrition Discussed,  Carbohydrate meal planning, Adding fruits and vegetables, Fluid intake, Portion sizes, Decreasing sugar intake  Pharmacy Interventions   Pharmacy Dicussed/Reviewed Pharmacy Topics Discussed, Medications and their functions  Safety Interventions   Safety Discussed/Reviewed Safety Discussed              SDOH assessments and interventions completed:  Yes  SDOH Interventions Today    Flowsheet Row Most Recent Value  SDOH Interventions   Housing Interventions Intervention Not Indicated  Transportation Interventions Intervention Not Indicated        Care Coordination Interventions:  Yes, provided   Follow up plan: Follow up call scheduled for July    Encounter Outcome:  Pt. Visit Completed   Jone Baseman, RN, MSN [facility] Care Management Coordinator Direct Line [phone]

## 2022-07-26 NOTE — Patient Instructions (Signed)
Visit Information  Thank you for taking time to visit with me today. Please don't hesitate to contact me if I can be of assistance to you.   Following are the goals we discussed today:   Goals Addressed             This Visit's Progress    Health Maintenance       Patient Goals/Self Care Activities: -Patient/Caregiver will self-administer medications as prescribed as evidenced by self-report/primary caregiver report  -Patient/Caregiver will attend all scheduled provider appointments as evidenced by clinician review of documented attendance to scheduled appointments and patient/caregiver report  -Calls provider office for new concerns, questions, or BP outside discussed parameters -Checks BP and records as discussed -Follows a low sodium diet/DASH diet -check blood sugar at prescribed times -record values and write them down take them to all doctor visits  -check feet daily for cuts, sores or redness -trim toenails straight across -wash and dry feet carefully every day -wear comfortable, cotton socks -wear comfortable, well-fitting shoes -Adhere to low sodium diet   Interventions Today    Flowsheet Row Most Recent Value  Chronic Disease   Chronic disease during today's visit Diabetes  General Interventions   General Interventions Discussed/Reviewed General Interventions Discussed, Health Screening, Doctor Visits  Doctor Visits Discussed/Reviewed Doctor Visits Discussed  Health Screening Prostate, Colonoscopy  Education Interventions   Education Provided Provided Education  Provided Verbal Education On Nutrition, Medication, Blood Sugar Monitoring, When to see the doctor  Mental Health Interventions   Mental Health Discussed/Reviewed Mental Health Discussed, Depression  Nutrition Interventions   Nutrition Discussed/Reviewed Nutrition Discussed, Carbohydrate meal planning, Adding fruits and vegetables, Fluid intake, Portion sizes, Decreasing sugar intake  Pharmacy  Interventions   Pharmacy Dicussed/Reviewed Pharmacy Topics Discussed, Medications and their functions  Safety Interventions   Safety Discussed/Reviewed Safety Discussed              Our next appointment is by telephone on 72/24 at 1000  Please call the care guide team at [phone] if you need to cancel or reschedule your appointment.   If you are experiencing a Mental Health or Stella or need someone to talk to, please call the Suicide and Crisis Lifeline: 988   The patient verbalized understanding of instructions, educational materials, and care plan provided today and agreed to receive a mailed copy of patient instructions, educational materials, and care plan.   The patient has been provided with contact information for the care management team and has been advised to call with any health related questions or concerns.   Jone Baseman, RN, MSN [facility] Care Management Coordinator Direct Line [phone]

## 2022-07-31 ENCOUNTER — Encounter: Payer: Self-pay | Admitting: Diagnostic Neuroimaging

## 2022-07-31 ENCOUNTER — Ambulatory Visit (INDEPENDENT_AMBULATORY_CARE_PROVIDER_SITE_OTHER): Payer: Medicare Other | Admitting: Diagnostic Neuroimaging

## 2022-07-31 VITALS — BP 134/64 | HR 56 | Ht 67.0 in | Wt 215.0 lb

## 2022-07-31 DIAGNOSIS — H532 Diplopia: Secondary | ICD-10-CM | POA: Diagnosis not present

## 2022-07-31 NOTE — Progress Notes (Signed)
[facility]  PATIENT: Allen Dyer DOB: [birth year]  REFERRING CLINICIAN: Koirala, Dibas, MD HISTORY FROM: patient  REASON FOR VISIT: new consult   HISTORICAL  CHIEF COMPLAINT:  Chief Complaint  Patient presents with   New Patient (Initial Visit)    Pt alone, rm 7. Here for evaluation of ongoing double vision. He states that he had a stroke in May 2023 and he devleoped the double vision concerns Jan 2024. He went saw the eye MD who states all was well. Followed with PCP who ordered MRI completed feb 2024 which didn't indicate any new stroke. If he blows nose really hard its like his eyes cross and the room starts spinning. Intermittently has episodes develops a dull HA on posterior right head.   Other    Pt has also mentioned there are intermittent moments where he will have numbness on right side of lips and tongue.     HISTORY OF PRESENT ILLNESS:   87 year old male here for evaluation of double vision.  History of hypertension, hyperlipidemia, diabetes, chronic kidney disease.  May 2023 patient presented to hospital for dizziness and vertigo.  Had MRI of the brain which showed acute to subacute bilateral cerebellar ischemic infarctions.  Patient was sent to the hospital for evaluation.  Stroke workup was completed.  He was treated with dual antiplatelet for 3 months and then aspirin alone.  January 2024 patient had 1 episode of double vision lasting 30 seconds and another episode of "triple vision" lasting for few minutes.  The second event was associated with dizziness.  He also had some intermittent numbness on his right tongue and lip.  No further events since January 2024.  Blood pressure has been under good control, although sometimes his systolic blood pressure was 90's.    REVIEW OF SYSTEMS: Full 14 system review of systems performed and negative with exception of: as per HPI.  ALLERGIES: Allergies  Allergen Reactions   Tramadol Other (See Comments)     Dizziness (intolerance) hallucinate   Finasteride Swelling    Breast enlargement    Jardiance [Empagliflozin]     Yeast infection   Lisinopril Cough   Ciprofloxacin Other (See Comments)    Dizziness (intolerance)    Simvastatin Hives and Other (See Comments)    Blisters/ Rash    Sulfa Antibiotics Rash    HOME MEDICATIONS: Outpatient Medications Prior to Visit  Medication Sig Dispense Refill   allopurinol (ZYLOPRIM) 100 MG tablet Take 50 mg by mouth every morning.     Ascorbic Acid (VITAMIN C) 1000 MG tablet Take 4,000 mg by mouth every morning.     aspirin 81 MG chewable tablet Chew 1 tablet (81 mg total) by mouth daily. 30 tablet 11   Blood Glucose Monitoring Suppl (FREESTYLE LITE) w/Device KIT 1 Device by Does not apply route daily in the afternoon. 1 kit 0   carvedilol (COREG) 6.25 MG tablet Take 1 tablet (6.25 mg total) by mouth 2 (two) times daily with a meal. 180 tablet 3   Cholecalciferol (VITAMIN D3 PO) Take 1 capsule by mouth every morning.     colchicine 0.6 MG tablet Take 0.6 mg by mouth daily as needed (gout).     cyanocobalamin 1000 MCG tablet Take 1,000 mcg by mouth every morning.     eplerenone (INSPRA) 50 MG tablet Take 1 tablet (50 mg total) by mouth daily. (Patient taking differently: Take 50 mg by mouth every morning.) 90 tablet 3   ezetimibe (ZETIA) 10 MG tablet Take  1 tablet (10 mg total) by mouth daily. 30 tablet 3   gabapentin (NEURONTIN) 600 MG tablet Take 300 mg by mouth 2 (two) times daily.     Garlic 1000 MG CAPS Take 1,000 mg by mouth every morning.     glipiZIDE (GLUCOTROL) 5 MG tablet Take 1 tablet (5 mg total) by mouth 2 (two) times daily before a meal. 180 tablet 3   glucose blood (FREESTYLE LITE) test strip 1 each by Other route daily in the afternoon. Use as instructed 100 each 3   hydrALAZINE (APRESOLINE) 50 MG tablet Take 1 tablet (50 mg total) by mouth 3 (three) times daily. 6am, 2pm and 10pm (Patient taking differently: Take 25 mg by mouth 3  (three) times daily. 6am, 2pm and 10pm)     isosorbide mononitrate (IMDUR) 30 MG 24 hr tablet Take 1 tablet (30 mg total) by mouth every morning.     meclizine (ANTIVERT) 25 MG tablet Take 25 mg by mouth 3 (three) times daily as needed for dizziness.     Multiple Vitamin (MULTIVITAMIN WITH MINERALS) TABS tablet Take 1 tablet by mouth every morning.     pantoprazole (PROTONIX) 40 MG tablet Take 1 tablet (40 mg total) by mouth daily. 30 tablet 2   potassium chloride SA (KLOR-CON M) 20 MEQ tablet Take 1 tablet (20 mEq total) by mouth daily. (Patient taking differently: Take 20 mEq by mouth every morning.) 90 tablet 3   tamsulosin (FLOMAX) 0.4 MG CAPS capsule Take 0.4 mg by mouth 2 (two) times daily.      torsemide (DEMADEX) 20 MG tablet Take 3 tablets (60 mg total) by mouth every morning AND 2 tablets (40 mg total) every evening. 180 tablet 5   triamcinolone cream (KENALOG) 0.1 % Apply 1 Application topically 2 (two) times daily.     clopidogrel (PLAVIX) 75 MG tablet Take 1 tablet (75 mg total) by mouth daily. 30 tablet 2   Omega-3 Fatty Acids (FISH OIL) 1200 MG CAPS Take 1,200 mg by mouth daily.     Oyster Shell Calcium 500 MG TABS Take 500 mg by mouth daily.     No facility-administered medications prior to visit.    PAST MEDICAL HISTORY: Past Medical History:  Diagnosis Date   Chronic combined systolic and diastolic CHF (congestive heart failure)    CKD (chronic kidney disease), stage III    Hyperlipidemia    Hypertension    Lower extremity edema    Mild CAD    a. mild-mod by cath 05/2018.   Multiple myeloma 11/15/2018   NICM (nonischemic cardiomyopathy)    Peripheral neuropathy    Prostate cancer    Status post XRT   Rheumatic fever    Trifascicular block    Type 2 diabetes mellitus     PAST SURGICAL HISTORY: Past Surgical History:  Procedure Laterality Date   RIGHT HEART CATH N/A 07/10/2018   Procedure: RIGHT HEART CATH;  Surgeon: Laurey Morale, MD;  Location: [facility] INVASIVE CV  LAB;  Service: Cardiovascular;  Laterality: N/A;   RIGHT/LEFT HEART CATH AND CORONARY ANGIOGRAPHY N/A 06/18/2018   Procedure: RIGHT/LEFT HEART CATH AND CORONARY ANGIOGRAPHY;  Surgeon: Lennette Bihari, MD;  Location: [facility] INVASIVE CV LAB;  Service: Cardiovascular;  Laterality: N/A;    FAMILY HISTORY: Family History  Problem Relation Age of Onset   Cancer Mother    Congestive Heart Failure Father        died from heart failure at the age of 36   Cancer  Sister    Congestive Heart Failure Brother        died from heart failure at the age of 20   Stroke Neg Hx     SOCIAL HISTORY: Social History   Socioeconomic History   Marital status: Widowed    Spouse name: Not on file   Number of children: 4   Years of education: Not on file   Highest education level: Not on file  Occupational History   Occupation: retired from post Electrical engineer  Tobacco Use   Smoking status: Former    Packs/day: 2.00    Years: 10.00    Additional pack years: 0.00    Total pack years: 20.00    Types: Cigarettes    Quit date: 1973    Years since quitting: 51.3   Smokeless tobacco: Never  Vaping Use   Vaping Use: Never used  Substance and Sexual Activity   Alcohol use: Not Currently    Comment: quit in 1975, no h/o heavy use   Drug use: Never   Sexual activity: Not on file  Other Topics Concern   Not on file  Social History Narrative   Not on file   Social Determinants of Health   Financial Resource Strain: Not on file  Food Insecurity: No Food Insecurity (02/10/2022)   Hunger Vital Sign    Worried About Running Out of Food in the Last Year: Never true    Ran Out of Food in the Last Year: Never true  Transportation Needs: No Transportation Needs (07/26/2022)   PRAPARE - Administrator, Civil Service (Medical): No    Lack of Transportation (Non-Medical): No  Physical Activity: Not on file  Stress: Not on file  Social Connections: Not on file  Intimate Partner Violence: Not on  file     PHYSICAL EXAM  GENERAL EXAM/CONSTITUTIONAL: Vitals:  Vitals:   07/31/22 1146  BP: 134/64  Pulse: (!) 56  Weight: 215 lb (97.5 kg)  Height: 5\' 7"  (1.702 m)   Body mass index is 33.67 kg/m. Wt Readings from Last 3 Encounters:  07/31/22 215 lb (97.5 kg)  05/08/22 207 lb 3.2 oz (94 kg)  04/26/22 209 lb (94.8 kg)   Patient is in no distress; well developed, nourished and groomed; neck is supple  CARDIOVASCULAR: Examination of carotid arteries is normal; no carotid bruits Regular rate and rhythm, no murmurs Examination of peripheral vascular system by observation and palpation is normal  EYES: Ophthalmoscopic exam of optic discs and posterior segments is normal; no papilledema or hemorrhages Vision Screening   Right eye Left eye Both eyes  Without correction 20/40 20/40 20/40   With correction       MUSCULOSKELETAL: Gait, strength, tone, movements noted in Neurologic exam below  NEUROLOGIC: MENTAL STATUS:      No data to display         awake, alert, oriented to person, place and time recent and remote memory intact normal attention and concentration language fluent, comprehension intact, naming intact fund of knowledge appropriate  CRANIAL NERVE:  2nd - no papilledema on fundoscopic exam 2nd, 3rd, 4th, 6th - pupils equal and reactive to light, visual fields full to confrontation, extraocular muscles intact, no nystagmus 5th - facial sensation symmetric 7th - facial strength symmetric 8th - hearing intact 9th - palate elevates symmetrically, uvula midline 11th - shoulder shrug symmetric 12th - tongue protrusion midline  MOTOR:  normal bulk and tone, full strength in the BUE, BLE  SENSORY:  normal and symmetric to light touch, pinprick, temperature, vibration  COORDINATION:  finger-nose-finger, fine finger movements normal  REFLEXES:  deep tendon reflexes present and symmetric  GAIT/STATION:  narrow based gait; able to walk on toes, heels  and tandem; romberg is negative     DIAGNOSTIC DATA (LABS, IMAGING, TESTING) - I reviewed patient records, labs, notes, testing and imaging myself where available.  Lab Results  Component Value Date   WBC 4.9 12/13/2021   HGB 11.3 (L) 12/13/2021   HCT 33.7 (L) 12/13/2021   MCV 98.5 12/13/2021   PLT 161 12/13/2021      Component Value Date/Time   NA 139 03/01/2022 1641   NA 143 07/01/2018 1233   K 4.0 03/01/2022 1641   CL 106 03/01/2022 1641   CO2 29 03/01/2022 1641   GLUCOSE 153 (H) 03/01/2022 1641   BUN 25 (H) 03/01/2022 1641   BUN 27 07/01/2018 1233   CREATININE 2.22 (H) 03/01/2022 1641   CREATININE 2.06 (H) 10/10/2018 1404   CALCIUM 9.3 03/01/2022 1641   PROT 6.9 12/13/2021 1255   ALBUMIN 3.9 12/13/2021 1255   AST 16 12/13/2021 1255   AST 20 10/10/2018 1404   ALT 15 12/13/2021 1255   ALT 16 10/10/2018 1404   ALKPHOS 60 12/13/2021 1255   BILITOT 0.4 12/13/2021 1255   BILITOT 0.4 10/10/2018 1404   GFRNONAA 29 (L) 03/01/2022 1641   GFRNONAA 29 (L) 10/10/2018 1404   GFRAA 26 (L) 12/02/2019 1108   GFRAA 34 (L) 10/10/2018 1404   Lab Results  Component Value Date   CHOL 127 01/16/2022   HDL 35 (L) 01/16/2022   LDLCALC 59 01/16/2022   TRIG 165 (H) 01/16/2022   CHOLHDL 3.6 01/16/2022   Lab Results  Component Value Date   HGBA1C 8.1 (A) 04/26/2022   Lab Results  Component Value Date   VITAMINB12 741 07/12/2020   No results found for: "TSH"  09/12/21 TTE 1. Left ventricular ejection fraction, by estimation, is 35 to 40%. The  left ventricle has moderately decreased function. The left ventricle  demonstrates global hypokinesis. There is mild concentric left ventricular  hypertrophy. Left ventricular  diastolic parameters are consistent with Grade I diastolic dysfunction  (impaired relaxation).   2. Right ventricular systolic function is normal. The right ventricular  size is normal.   3. Left atrial size was mildly dilated.   4. The mitral valve is grossly  normal. Mild mitral valve regurgitation.   5. The aortic valve is tricuspid. Aortic valve regurgitation is mild.  Aortic valve sclerosis/calcification is present, without any evidence of  aortic stenosis.   6. Aortic dilatation noted. There is mild dilatation of the ascending  aorta, measuring 45 mm.   7. The inferior vena cava is normal in size with greater than 50%  respiratory variability, suggesting right atrial pressure of 3 mmHg.   Comparison(s): Compared to TTE on 06/2021, the LVEF appears slightly  better at 35-40% (previously 30-35%).   06/06/22 MRI brain 1. No acute finding. Numerous old infarctions in the cerebellum and right occipital lobe which were acute in May of 2023. Chronic small-vessel ischemic changes of the pons and cerebral hemispheric white matter also present. 2. No visible flow in the right vertebral artery seen at the foramen magnum level. This is a chronic finding.  09/11/21 MRA head  Intracranial right vertebral artery shows minimal flow only distally, which is probably retrograde.   High-grade stenosis of the distal left vertebral artery.   Atherosclerosis of the  basilar with up to moderate stenosis.   Bilateral intracranial ICA atherosclerosis with up to moderate stenosis.   09/13/21 MRA neck 1. No flow seen in the right vertebral artery throughout the neck. 2. Diminished flow in the partially covered left V4 segment associated with a severe stenosis seen by prior intracranial MRA. 3. No flow reducing stenosis or ulceration seen in the tortuous cervical carotids.     ASSESSMENT AND PLAN  87 y.o. year old male here with:   Dx:  1. Double vision with both eyes open     PLAN:  TRANSIENT DOUBLE VISION (TRIPLE VISION), DIZZINESS, RIGHT LIP NUMBNESS (last events Jan 2024; h/o cerebellar strokes in May 2023) - could be related to posterior circulation TIA --> recommend continued medical mgmt (continue aspirin, BP control, diabetes control); if  any more TIA, then may need to consider dual anti-platelet (aspirin + plavix) and then possible vertebral artery stenting referral (although patient reluctant for procedures) - avoid low BP (goal SBP 120-140) - check AchR ab (rule out myasthenia gravis)  Orders Placed This Encounter  Procedures   AChR Abs with Reflex to [facility]SK   Return for pending if symptoms worsen or fail to improve, pending test results.    Suanne Marker, MD 07/31/2022, 11:55 AM Certified in Neurology, Neurophysiology and Neuroimaging  [facility] [address], [address] [place], [place] [address] [phone]

## 2022-07-31 NOTE — Patient Instructions (Signed)
  TRANSIENT DOUBLE VISION (TRIPLE VISION), DIZZINESS, RIGHT LIP NUMBNESS (last events Jan 2024; h/o cerebellar strokes in May 2023) - could be related to posterior circulation TIA --> recommend continued medical mgmt (continue aspirin, BP control, diabetes control); if any more TIA, then may need to consider dual anti-platelet (aspirin + plavix) and then possible vertebral artery stenting referral - avoid low BP (goal SBP 120-140) - check AchR ab (rule out myasthenia gravis)

## 2022-08-02 LAB — MUSK ANTIBODIES

## 2022-08-08 LAB — ACHR ABS WITH REFLEX TO MUSK: AChR Binding Ab, Serum: <0.03 nmol/L (ref 0.00–0.24)

## 2022-08-29 ENCOUNTER — Ambulatory Visit (INDEPENDENT_AMBULATORY_CARE_PROVIDER_SITE_OTHER): Payer: Medicare Other | Admitting: Podiatry

## 2022-08-29 ENCOUNTER — Encounter: Payer: Self-pay | Admitting: Podiatry

## 2022-08-29 DIAGNOSIS — E1159 Type 2 diabetes mellitus with other circulatory complications: Secondary | ICD-10-CM | POA: Diagnosis not present

## 2022-08-29 DIAGNOSIS — M79675 Pain in left toe(s): Secondary | ICD-10-CM | POA: Diagnosis not present

## 2022-08-29 DIAGNOSIS — M79674 Pain in right toe(s): Secondary | ICD-10-CM

## 2022-08-29 DIAGNOSIS — Q828 Other specified congenital malformations of skin: Secondary | ICD-10-CM | POA: Diagnosis not present

## 2022-08-29 DIAGNOSIS — B351 Tinea unguium: Secondary | ICD-10-CM

## 2022-08-29 NOTE — Progress Notes (Signed)
This patient returns to my office for at risk foot care.  This patient requires this care by a professional since this patient will be at risk due to having  Diabetes and chronic kidney disease.   This patient is unable to cut nails himself  since the patient cannot reach his  nails.These nails are painful walking and wearing shoes.  This patient presents for at risk foot care today.    General Appearance  Alert, conversant and in no acute stress.  Vascular  Dorsalis pedis and posterior tibial  pulses are palpable  bilaterally.  Capillary return is within normal limits  bilaterally. Temperature is within normal limits  bilaterally.  Neurologic  Senn-Weinstein monofilament wire test diminished   bilaterally. Muscle power within normal limits bilaterally.  Nails Thick disfigured discolored nails with subungual debris  from hallux to fifth toes bilaterally. No evidence of bacterial infection or drainage bilaterally.  Orthopedic  No limitations of motion  feet .  No crepitus or effusions noted.  No bony pathology or digital deformities noted.  Arthritis right foot/ankle.  Skin  normotropic skin with no porokeratosis noted bilaterally.  No signs of infections or ulcers noted.   Symptomatic porokeratosis sub 5th  B/L. Ski tears under 4th and 5th toes right foot.  Dispense povidine ointment for application to skin tears.  Onychomycosis  Pain in right toes  Pain in left toes  Porokeratosis  B/L.  Consent was obtained for treatment procedures.   Mechanical debridement of nails 1-5  bilaterally performed with a nail nipper.  Filed with dremel without incident. No infection or ulcer.  Debride porokeratosis sub 5th  B/L.   Return office visit   3 months       Told patient to return for periodic foot care and evaluation due to potential at risk complications.   Helane Gunther DPM

## 2022-09-05 ENCOUNTER — Encounter: Payer: Self-pay | Admitting: Internal Medicine

## 2022-09-05 ENCOUNTER — Ambulatory Visit (INDEPENDENT_AMBULATORY_CARE_PROVIDER_SITE_OTHER): Payer: Medicare Other | Admitting: Internal Medicine

## 2022-09-05 VITALS — BP 124/76 | HR 69 | Ht 67.0 in | Wt 213.0 lb

## 2022-09-05 DIAGNOSIS — E1159 Type 2 diabetes mellitus with other circulatory complications: Secondary | ICD-10-CM | POA: Diagnosis not present

## 2022-09-05 DIAGNOSIS — Z7984 Long term (current) use of oral hypoglycemic drugs: Secondary | ICD-10-CM

## 2022-09-05 DIAGNOSIS — E1122 Type 2 diabetes mellitus with diabetic chronic kidney disease: Secondary | ICD-10-CM

## 2022-09-05 DIAGNOSIS — N184 Chronic kidney disease, stage 4 (severe): Secondary | ICD-10-CM

## 2022-09-05 LAB — POCT GLYCOSYLATED HEMOGLOBIN (HGB A1C): Hemoglobin A1C: 6.7 % — AB (ref 4.0–5.6)

## 2022-09-05 NOTE — Progress Notes (Signed)
Name: Allen Dyer  Age/ Sex: 87 y.o., male   MRN/ DOB: [id number], [birth year]     PCP: Darrow Bussing, MD   Reason for Endocrinology Evaluation: Type 2 Diabetes Mellitus  Initial Endocrine Consultative Visit: 06/21/2020    PATIENT IDENTIFIER: [name] is a 87 y.o. male with a past medical history of T2DM, HTN, Neuropathy, CHF (04/2018). The patient has followed with Endocrinology clinic since 06/21/2020 for consultative assistance with management of his diabetes.  DIABETIC HISTORY:  [name] was diagnosed with DM > 30 yrs.Intolerant to Jardiance due to yeats infections .Started Rybelsus 05/2020 but discontinued due to cost  . His hemoglobin A1c has ranged from 6.6% in 2020, peaking at 8.1% .   SUBJECTIVE:   During the last visit (04/26/2022): A1c 8.1%      Today (09/05/2022): [name] is here for a follow up on diabetes management . He checks his blood sugars occasionally. The patient has not  had hypoglycemic episodes .  Saw podiatry 08/29/2022 Was seen by Neurology for double vision 07/2022, though to be TIA Continues to follow with oncology for MM with disease progression Denies nausea, vomiting  Denies  diarrhea has occasional constipation    HOME DIABETES REGIMEN:  Glipizide 5 mg ,BID    Statin: no ACE-I/ARB: no     METER DOWNLOAD SUMMARY: 4/15-5/14/2024 Fingerstick Blood Glucose Tests = 4 Overall Mean FS Glucose = 117 Standard Deviation = 31  BG Ranges: Low = 85 High = 159  BG Target % Results: % In target = 100 % Over target = 0 % Under target = 0  Hypoglycemic Events/30 Days: BG < 50 = 0 Episodes of symptomatic severe hypoglycemia = 0      DIABETIC COMPLICATIONS: Microvascular complications:  Neuropathy, CKD IV Denies: retinopathy Last Eye Exam: Completed 04/2022  Macrovascular complications:  CHF, CVA 08/2021 Denies: CAD, PVD   HISTORY:  Past Medical History:  Past Medical History:  Diagnosis Date   Chronic combined systolic  and diastolic CHF (congestive heart failure) (HCC)    CKD (chronic kidney disease), stage III (HCC)    Hyperlipidemia    Hypertension    Lower extremity edema    Mild CAD    a. mild-mod by cath 05/2018.   Multiple myeloma (HCC) 11/15/2018   NICM (nonischemic cardiomyopathy) (HCC)    Peripheral neuropathy    Prostate cancer (HCC)    Status post XRT   Rheumatic fever    Trifascicular block    Type 2 diabetes mellitus (HCC)    Past Surgical History:  Past Surgical History:  Procedure Laterality Date   RIGHT HEART CATH N/A 07/10/2018   Procedure: RIGHT HEART CATH;  Surgeon: Laurey Morale, MD;  Location: [facility] INVASIVE CV LAB;  Service: Cardiovascular;  Laterality: N/A;   RIGHT/LEFT HEART CATH AND CORONARY ANGIOGRAPHY N/A 06/18/2018   Procedure: RIGHT/LEFT HEART CATH AND CORONARY ANGIOGRAPHY;  Surgeon: Lennette Bihari, MD;  Location: [facility] INVASIVE CV LAB;  Service: Cardiovascular;  Laterality: N/A;   Social History:  reports that he quit smoking about 51 years ago. His smoking use included cigarettes. He has a 20.00 pack-year smoking history. He has never used smokeless tobacco. He reports that he does not currently use alcohol. He reports that he does not use drugs. Family History:  Family History  Problem Relation Age of Onset   Cancer Mother    Congestive Heart Failure Father        died from heart failure at the age of 77  Cancer Sister    Congestive Heart Failure Brother        died from heart failure at the age of 28   Stroke Neg Hx      HOME MEDICATIONS: Allergies as of 09/05/2022       Reactions   Tramadol Other (See Comments)   Dizziness (intolerance) hallucinate   Finasteride Swelling   Breast enlargement   Jardiance [empagliflozin]    Yeast infection   Lisinopril Cough   Ciprofloxacin Other (See Comments)   Dizziness (intolerance)   Simvastatin Hives, Other (See Comments)   Blisters/ Rash   Sulfa Antibiotics Rash        Medication List        Accurate as  of Sep 05, 2022 10:00 AM. If you have any questions, ask your nurse or doctor.          allopurinol 100 MG tablet Commonly known as: ZYLOPRIM Take 50 mg by mouth every morning.   Aspirin Low Dose 81 MG chewable tablet Generic drug: aspirin Chew 1 tablet (81 mg total) by mouth daily.   carvedilol 6.25 MG tablet Commonly known as: COREG Take 1 tablet (6.25 mg total) by mouth 2 (two) times daily with a meal.   colchicine 0.6 MG tablet Take 0.6 mg by mouth daily as needed (gout).   cyanocobalamin 1000 MCG tablet Take 1,000 mcg by mouth every morning.   eplerenone 50 MG tablet Commonly known as: INSPRA Take 1 tablet (50 mg total) by mouth daily. What changed: when to take this   ezetimibe 10 MG tablet Commonly known as: ZETIA Take 1 tablet (10 mg total) by mouth daily.   Fish Oil 1200 MG Caps Take 1,200 mg by mouth daily.   FREESTYLE LITE test strip Generic drug: glucose blood 1 each by Other route daily in the afternoon. Use as instructed   FreeStyle Lite w/Device Kit 1 Device by Does not apply route daily in the afternoon.   gabapentin 600 MG tablet Commonly known as: NEURONTIN Take 300 mg by mouth 2 (two) times daily.   Garlic 1000 MG Caps Take 1,000 mg by mouth every morning.   glipiZIDE 5 MG tablet Commonly known as: GLUCOTROL Take 1 tablet (5 mg total) by mouth 2 (two) times daily before a meal.   hydrALAZINE 50 MG tablet Commonly known as: APRESOLINE Take 1 tablet (50 mg total) by mouth 3 (three) times daily. 6am, 2pm and 10pm What changed: how much to take   isosorbide mononitrate 30 MG 24 hr tablet Commonly known as: IMDUR Take 1 tablet (30 mg total) by mouth every morning.   meclizine 25 MG tablet Commonly known as: ANTIVERT Take 25 mg by mouth 3 (three) times daily as needed for dizziness.   multivitamin with minerals Tabs tablet Take 1 tablet by mouth every morning.   Oyster Shell Calcium 500 MG Tabs Take 500 mg by mouth daily.    pantoprazole 40 MG tablet Commonly known as: PROTONIX Take 1 tablet (40 mg total) by mouth daily.   potassium chloride SA 20 MEQ tablet Commonly known as: KLOR-CON M Take 1 tablet (20 mEq total) by mouth daily. What changed: when to take this   tamsulosin 0.4 MG Caps capsule Commonly known as: FLOMAX Take 0.4 mg by mouth 2 (two) times daily.   torsemide 20 MG tablet Commonly known as: DEMADEX Take 3 tablets (60 mg total) by mouth every morning AND 2 tablets (40 mg total) every evening.   triamcinolone cream 0.1 %  Commonly known as: KENALOG Apply 1 Application topically 2 (two) times daily.   vitamin C 1000 MG tablet Take 4,000 mg by mouth every morning.   VITAMIN D3 PO Take 1 capsule by mouth every morning.         OBJECTIVE:   Vital Signs:BP 124/76 (BP Location: Left Arm, Patient Position: Sitting, Cuff Size: Large)   Pulse 69   Ht 5\' 7"  (1.702 m)   Wt 213 lb (96.6 kg)   SpO2 99%   BMI 33.36 kg/m   Wt Readings from Last 3 Encounters:  09/05/22 213 lb (96.6 kg)  07/31/22 215 lb (97.5 kg)  05/08/22 207 lb 3.2 oz (94 kg)     Exam: General: Pt appears well and is in NAD  Lungs: Clear with good BS bilat   Heart: RRR   Extremities: Trace  pretibial edema. .  Neuro: MS is good with appropriate affect, pt is alert and Ox3    DM foot exam: 08/29/2022 Per podiatry     DATA REVIEWED:  Lab Results  Component Value Date   HGBA1C 8.1 (A) 04/26/2022   HGBA1C 6.7 (H) 09/12/2021   HGBA1C 9.0 (A) 03/30/2021    06/05/2022 BUN 26 Cr 2.30 GFR 27   ASSESSMENT / PLAN / RECOMMENDATIONS:   1) Type 2 Diabetes Mellitus, OPtimally controlled, With CKD IV, neuropathic and macrovascular complications - Most recent A1c of 6.7  %. Goal A1c < 7.5 %.    -I have praised the patient improved glycemic control, and encouraged him to continue with lifestyle changes -Intolerant to Jardiance due to yeast infections -Rybelsus has been cost prohibitive -He does not need  refills today as he gets these through the [place] -No changes   MEDICATIONS: - Continue  Glipizide 5 mg , 1 tablet before Breakfast  and  tablet before Supper      EDUCATION / INSTRUCTIONS: BG monitoring instructions: Patient is instructed to check his blood sugars 1 times a day, fasting . Call [place] [facility] if: BG persistently < 70 . I reviewed the Rule of 15 for the treatment of hypoglycemia in detail with the patient. Literature supplied.     2) Diabetic complications:  Eye: Does not have known diabetic retinopathy.  Neuro/ Feet: Does have known diabetic peripheral neuropathy .  Renal: Patient does  have known baseline CKD. He   is not on an ACEI/ARB at present. Will defer to nephrology     F/U in 6 months    Signed electronically by: Lyndle Herrlich, MD  [facility]  [facility] [address] [place], [place] [address] Phone: [phone] FAX: [phone]   CC: Darrow Bussing, MD [address] [place] [address] Phone: [phone]  Fax: [phone]  Return to Endocrinology clinic as below: Future Appointments  Date Time Provider Department Center  09/05/2022 12:10 PM Krimson Massmann, Konrad Dolores, MD [facility] None  10/24/2022 10:00 AM Fleeta Emmer, RN [facility] None  11/30/2022  1:45 PM Helane Gunther, DPM [facility]  12/11/2022 11:00 AM [facility] None  12/21/2022 11:00 AM [name], MD [facility] None

## 2022-09-05 NOTE — Patient Instructions (Signed)
  Glipizide 5 mg 1 tablet before Breakfast and 1 tablet before supper   HOW TO TREAT LOW BLOOD SUGARS (Blood sugar LESS THAN 70 MG/DL) Please follow the RULE OF 15 for the treatment of hypoglycemia treatment (when your (blood sugars are less than 70 mg/dL)   STEP 1: Take 15 grams of carbohydrates when your blood sugar is low, which includes:  3-4 GLUCOSE TABS  OR 3-4 OZ OF JUICE OR REGULAR SODA OR ONE TUBE OF GLUCOSE GEL    STEP 2: RECHECK blood sugar in 15 MINUTES STEP 3: If your blood sugar is still low at the 15 minute recheck --> then, go back to STEP 1 and treat AGAIN with another 15 grams of carbohydrates. 

## 2022-09-07 DIAGNOSIS — N184 Chronic kidney disease, stage 4 (severe): Secondary | ICD-10-CM | POA: Diagnosis not present

## 2022-09-12 DIAGNOSIS — N184 Chronic kidney disease, stage 4 (severe): Secondary | ICD-10-CM | POA: Diagnosis not present

## 2022-09-12 DIAGNOSIS — N2581 Secondary hyperparathyroidism of renal origin: Secondary | ICD-10-CM | POA: Diagnosis not present

## 2022-09-12 DIAGNOSIS — D472 Monoclonal gammopathy: Secondary | ICD-10-CM | POA: Diagnosis not present

## 2022-09-12 DIAGNOSIS — E1122 Type 2 diabetes mellitus with diabetic chronic kidney disease: Secondary | ICD-10-CM | POA: Diagnosis not present

## 2022-09-12 DIAGNOSIS — I5020 Unspecified systolic (congestive) heart failure: Secondary | ICD-10-CM | POA: Diagnosis not present

## 2022-09-12 DIAGNOSIS — I129 Hypertensive chronic kidney disease with stage 1 through stage 4 chronic kidney disease, or unspecified chronic kidney disease: Secondary | ICD-10-CM | POA: Diagnosis not present

## 2022-09-21 ENCOUNTER — Other Ambulatory Visit (HOSPITAL_COMMUNITY): Payer: Self-pay | Admitting: Family Medicine

## 2022-10-10 DIAGNOSIS — H6123 Impacted cerumen, bilateral: Secondary | ICD-10-CM | POA: Diagnosis not present

## 2022-10-10 DIAGNOSIS — H903 Sensorineural hearing loss, bilateral: Secondary | ICD-10-CM | POA: Diagnosis not present

## 2022-10-24 ENCOUNTER — Ambulatory Visit: Payer: Self-pay

## 2022-10-24 NOTE — Patient Instructions (Signed)
Visit Information  Thank you for taking time to visit with me today. Please don't hesitate to contact me if I can be of assistance to you.   Following are the goals we discussed today:   Goals Addressed             This Visit's Progress    Health Maintenance       Patient Goals/Self Care Activities: -Patient/Caregiver will self-administer medications as prescribed as evidenced by self-report/primary caregiver report  -Patient/Caregiver will attend all scheduled provider appointments as evidenced by clinician review of documented attendance to scheduled appointments and patient/caregiver report  -Calls provider office for new concerns, questions, or BP outside discussed parameters -Checks BP and records as discussed -Follows a low sodium diet/DASH diet -check blood sugar at prescribed times -record values and write them down take them to all doctor visits  -check feet daily for cuts, sores or redness -trim toenails straight across -wash and dry feet carefully every day -wear comfortable, cotton socks -wear comfortable, well-fitting shoes -Adhere to low sodium diet           Our next appointment is by telephone on 01/24/23 at 1000 am  Please call the care guide team at [phone] if you need to cancel or reschedule your appointment.   If you are experiencing a Mental Health or Behavioral Health Crisis or need someone to talk to, please call the Suicide and Crisis Lifeline: 988   Patient verbalizes understanding of instructions and care plan provided today and agrees to view in MyChart. Active MyChart status and patient understanding of how to access instructions and care plan via MyChart confirmed with patient.     The patient has been provided with contact information for the care management team and has been advised to call with any health related questions or concerns.   Bary Leriche, RN, MSN [facility] Care Management Care Management Coordinator Direct Line  [phone]

## 2022-10-24 NOTE — Patient Outreach (Signed)
  Care Coordination   Follow Up Visit Note   10/24/2022 Name: Allen Dyer MRN: [id number] DOB: 1935-05-28  Allen Dyer is a 87 y.o. year old male who sees Koirala, Dibas, MD for primary care. I spoke with  Michaelene Song by phone today.  What matters to the patients health and wellness today?  Health maintenance    Goals Addressed             This Visit's Progress    Health Maintenance       Patient Goals/Self Care Activities: -Patient/Caregiver will self-administer medications as prescribed as evidenced by self-report/primary caregiver report  -Patient/Caregiver will attend all scheduled provider appointments as evidenced by clinician review of documented attendance to scheduled appointments and patient/caregiver report  -Calls provider office for new concerns, questions, or BP outside discussed parameters -Checks BP and records as discussed -Follows a low sodium diet/DASH diet -check blood sugar at prescribed times -record values and write them down take them to all doctor visits  -check feet daily for cuts, sores or redness -trim toenails straight across -wash and dry feet carefully every day -wear comfortable, cotton socks -wear comfortable, well-fitting shoes -Adhere to low sodium diet           SDOH assessments and interventions completed:  Yes  SDOH Interventions Today    Flowsheet Row Most Recent Value  SDOH Interventions   Food Insecurity Interventions Intervention Not Indicated  Utilities Interventions Intervention Not Indicated        Care Coordination Interventions:  Yes, provided   Follow up plan: Follow up call scheduled for October    Encounter Outcome:  Pt. Visit Completed   Bary Leriche, RN, MSN [facility] Care Management Care Management Coordinator Direct Line [phone]

## 2022-11-09 DIAGNOSIS — U071 COVID-19: Secondary | ICD-10-CM | POA: Diagnosis not present

## 2022-11-29 ENCOUNTER — Other Ambulatory Visit: Payer: Self-pay | Admitting: Internal Medicine

## 2022-11-30 ENCOUNTER — Ambulatory Visit (INDEPENDENT_AMBULATORY_CARE_PROVIDER_SITE_OTHER): Payer: Medicare Other | Admitting: Podiatry

## 2022-11-30 ENCOUNTER — Encounter: Payer: Self-pay | Admitting: Podiatry

## 2022-11-30 DIAGNOSIS — M79675 Pain in left toe(s): Secondary | ICD-10-CM | POA: Diagnosis not present

## 2022-11-30 DIAGNOSIS — B351 Tinea unguium: Secondary | ICD-10-CM

## 2022-11-30 DIAGNOSIS — M79674 Pain in right toe(s): Secondary | ICD-10-CM

## 2022-11-30 DIAGNOSIS — E1159 Type 2 diabetes mellitus with other circulatory complications: Secondary | ICD-10-CM | POA: Diagnosis not present

## 2022-11-30 DIAGNOSIS — Q828 Other specified congenital malformations of skin: Secondary | ICD-10-CM | POA: Diagnosis not present

## 2022-11-30 NOTE — Progress Notes (Signed)
This patient returns to my office for at risk foot care.  This patient requires this care by a professional since this patient will be at risk due to having  Diabetes and chronic kidney disease.   This patient is unable to cut nails himself  since the patient cannot reach his  nails.These nails are painful walking and wearing shoes. He says he has opened his skin beneath his fourth toe right foot.  He said that was treated with betadine and closed but has reopened the last 5 days. This patient presents for at risk foot care today.    General Appearance  Alert, conversant and in no acute stress.  Vascular  Dorsalis pedis and posterior tibial  pulses are palpable  bilaterally.  Capillary return is within normal limits  bilaterally. Temperature is within normal limits  bilaterally.  Neurologic  Senn-Weinstein monofilament wire test diminished   bilaterally. Muscle power within normal limits bilaterally.  Nails Thick disfigured discolored nails with subungual debris  from hallux to fifth toes bilaterally. No evidence of bacterial infection or drainage bilaterally.  Orthopedic  No limitations of motion  feet .  No crepitus or effusions noted.  No bony pathology or digital deformities noted.  Arthritis right foot/ankle.  Skin  normotropic skin with no porokeratosis noted bilaterally.  No signs of infections or ulcers noted.   Symptomatic porokeratosis sub 5th  B/L. Ski tears under 4th and 5th toes right foot.  Dispense povidine ointment for application to skin tears.  Onychomycosis  Pain in right toes  Pain in left toes  Porokeratosis  B/L.  Consent was obtained for treatment procedures.   Mechanical debridement of nails 1-5  bilaterally performed with a nail nipper.  Filed with dremel without incident. No infection or ulcer.  Debride porokeratosis sub 5th  B/L.   Return office visit   3 months       Told patient to return for periodic foot care and evaluation due to potential at risk  complications.   Helane Gunther DPM

## 2022-12-04 ENCOUNTER — Telehealth: Payer: Self-pay | Admitting: Cardiology

## 2022-12-04 NOTE — Telephone Encounter (Signed)
Patient is calling because [employer] was supplying his CPAP Machine, but they will no longer be in charge of his supplies. Patient was told [employer] will be able to supply his Cpap Machine supplies. Patient spoke with [facility] and they are requesting an order from Dr. Mayford Knife and the patient's most recent sleep study. Patient states he is out of supplies and needs this process started. Please advise.

## 2022-12-11 ENCOUNTER — Telehealth: Payer: Self-pay

## 2022-12-11 ENCOUNTER — Inpatient Hospital Stay: Payer: Medicare Other | Attending: Hematology and Oncology

## 2022-12-11 DIAGNOSIS — D649 Anemia, unspecified: Secondary | ICD-10-CM | POA: Diagnosis not present

## 2022-12-11 DIAGNOSIS — N184 Chronic kidney disease, stage 4 (severe): Secondary | ICD-10-CM | POA: Insufficient documentation

## 2022-12-11 DIAGNOSIS — D472 Monoclonal gammopathy: Secondary | ICD-10-CM | POA: Insufficient documentation

## 2022-12-11 LAB — CBC WITH DIFFERENTIAL/PLATELET
Abs Immature Granulocytes: 0.01 10*3/uL (ref 0.00–0.07)
Basophils Absolute: 0 10*3/uL (ref 0.0–0.1)
Basophils Relative: 0 %
Eosinophils Absolute: 0.4 10*3/uL (ref 0.0–0.5)
Eosinophils Relative: 7 %
HCT: 33.3 % — ABNORMAL LOW (ref 39.0–52.0)
Hemoglobin: 11 g/dL — ABNORMAL LOW (ref 13.0–17.0)
Immature Granulocytes: 0 %
Lymphocytes Relative: 24 %
Lymphs Abs: 1.1 10*3/uL (ref 0.7–4.0)
MCH: 33.7 pg (ref 26.0–34.0)
MCHC: 33 g/dL (ref 30.0–36.0)
MCV: 102.1 fL — ABNORMAL HIGH (ref 80.0–100.0)
Monocytes Absolute: 0.4 10*3/uL (ref 0.1–1.0)
Monocytes Relative: 9 %
Neutro Abs: 2.8 10*3/uL (ref 1.7–7.7)
Neutrophils Relative %: 60 %
Platelets: 164 10*3/uL (ref 150–400)
RBC: 3.26 MIL/uL — ABNORMAL LOW (ref 4.22–5.81)
RDW: 14.3 % (ref 11.5–15.5)
WBC: 4.7 10*3/uL (ref 4.0–10.5)
nRBC: 0 % (ref 0.0–0.2)

## 2022-12-11 LAB — COMPREHENSIVE METABOLIC PANEL
ALT: 12 U/L (ref 0–44)
AST: 18 U/L (ref 15–41)
Albumin: 3.8 g/dL (ref 3.5–5.0)
Alkaline Phosphatase: 51 U/L (ref 38–126)
Anion gap: 6 (ref 5–15)
BUN: 35 mg/dL — ABNORMAL HIGH (ref 8–23)
CO2: 32 mmol/L (ref 22–32)
Calcium: 10.2 mg/dL (ref 8.9–10.3)
Chloride: 103 mmol/L (ref 98–111)
Creatinine, Ser: 2.73 mg/dL — ABNORMAL HIGH (ref 0.61–1.24)
GFR, Estimated: 22 mL/min — ABNORMAL LOW (ref >60)
Glucose, Bld: 138 mg/dL — ABNORMAL HIGH (ref 70–99)
Potassium: 4 mmol/L (ref 3.5–5.1)
Sodium: 141 mmol/L (ref 135–145)
Total Bilirubin: 0.4 mg/dL (ref 0.3–1.2)
Total Protein: 7.2 g/dL (ref 6.5–8.1)

## 2022-12-11 NOTE — Telephone Encounter (Signed)
Returned his call. His daughter will be out of town on a trip on 8/29. Moved his appt to 8/10 at 8 am her his and daughter request.

## 2022-12-12 LAB — KAPPA/LAMBDA LIGHT CHAINS
Kappa free light chain: 161.9 mg/L — ABNORMAL HIGH (ref 3.3–19.4)
Kappa, lambda light chain ratio: 6.69 — ABNORMAL HIGH (ref 0.26–1.65)
Lambda free light chains: 24.2 mg/L (ref 5.7–26.3)

## 2022-12-18 LAB — MULTIPLE MYELOMA PANEL, SERUM
Albumin SerPl Elph-Mcnc: 3.6 g/dL (ref 2.9–4.4)
Albumin/Glob SerPl: 1.2 (ref 0.7–1.7)
Alpha 1: 0.2 g/dL (ref 0.0–0.4)
Alpha2 Glob SerPl Elph-Mcnc: 0.6 g/dL (ref 0.4–1.0)
B-Globulin SerPl Elph-Mcnc: 1.3 g/dL (ref 0.7–1.3)
Gamma Glob SerPl Elph-Mcnc: 1 g/dL (ref 0.4–1.8)
Globulin, Total: 3.2 g/dL (ref 2.2–3.9)
IgA: 731 mg/dL — ABNORMAL HIGH (ref 61–437)
IgG (Immunoglobin G), Serum: 1065 mg/dL (ref 603–1613)
IgM (Immunoglobulin M), Srm: 25 mg/dL (ref 15–143)
M Protein SerPl Elph-Mcnc: 0.6 g/dL — ABNORMAL HIGH
Total Protein ELP: 6.8 g/dL (ref 6.0–8.5)

## 2022-12-21 ENCOUNTER — Ambulatory Visit: Payer: Medicare Other | Admitting: Hematology and Oncology

## 2022-12-29 ENCOUNTER — Telehealth: Payer: Self-pay | Admitting: *Deleted

## 2022-12-29 DIAGNOSIS — I10 Essential (primary) hypertension: Secondary | ICD-10-CM

## 2022-12-29 DIAGNOSIS — G4733 Obstructive sleep apnea (adult) (pediatric): Secondary | ICD-10-CM

## 2022-12-29 DIAGNOSIS — I5022 Chronic systolic (congestive) heart failure: Secondary | ICD-10-CM

## 2022-12-29 NOTE — Telephone Encounter (Signed)
Cpap supply order sent to [facility] via community message.

## 2022-12-30 ENCOUNTER — Other Ambulatory Visit (HOSPITAL_COMMUNITY): Payer: Self-pay | Admitting: Family Medicine

## 2023-01-02 ENCOUNTER — Encounter: Payer: Self-pay | Admitting: Hematology and Oncology

## 2023-01-02 ENCOUNTER — Inpatient Hospital Stay: Payer: Medicare Other | Attending: Hematology and Oncology | Admitting: Hematology and Oncology

## 2023-01-02 VITALS — BP 133/69 | HR 78 | Temp 97.6°F | Resp 18 | Ht 67.0 in | Wt 222.4 lb

## 2023-01-02 DIAGNOSIS — N184 Chronic kidney disease, stage 4 (severe): Secondary | ICD-10-CM | POA: Diagnosis not present

## 2023-01-02 DIAGNOSIS — D649 Anemia, unspecified: Secondary | ICD-10-CM | POA: Diagnosis not present

## 2023-01-02 DIAGNOSIS — D472 Monoclonal gammopathy: Secondary | ICD-10-CM

## 2023-01-02 NOTE — Assessment & Plan Note (Signed)
This is likely anemia of chronic disease. The patient denies recent history of bleeding such as epistaxis, hematuria or hematochezia. He is asymptomatic from the anemia. We will observe for now.  

## 2023-01-02 NOTE — Progress Notes (Signed)
[name] OFFICE PROGRESS NOTE  Patient Care Team: Koirala, Dibas, MD as PCP - General (Family Medicine) Quintella Reichert, MD as PCP - Cardiology (Cardiology) Fleeta Emmer, RN as [facility] Care Management  ASSESSMENT & PLAN:  Smoldering multiple myeloma Overall, I do not believe he has disease progression The elevated serum light chains are likely due to recent changes in his renal function I recommend repeat blood work next year for further follow-up He is in agreement  Chronic kidney disease (CKD), stage IV (severe) (HCC) He has intermittent elevated kidney function on and off over the years We discussed importance of adequate hydration We discussed limitations and difficulties in interpreting his light chain level due to elevated serum creatinine I recommend the patient to increase oral fluid hydration as tolerated  Mild chronic anemia This is likely anemia of chronic disease. The patient denies recent history of bleeding such as epistaxis, hematuria or hematochezia. He is asymptomatic from the anemia. We will observe for now.    No orders of the defined types were placed in this encounter.   All questions were answered. The patient knows to call the clinic with any problems, questions or concerns. The total time spent in the appointment was 20 minutes encounter with patients including review of chart and various tests results, discussions about plan of care and coordination of care plan   [name], MD 01/02/2023 1:47 PM  INTERVAL HISTORY: Please see below for problem oriented charting. he returns for follow-up due to history of smoldering myeloma/MGUS He has gained a lot of weight recently According to the patient, he has been eating a lot of food that is not healthy, prepared by his daughter He denies signs or symptoms of congestive heart failure He has significant bilateral lower extremity edema We discussed test results  REVIEW OF  SYSTEMS:   Constitutional: Denies fevers, chills or abnormal weight loss Eyes: Denies blurriness of vision Ears, nose, mouth, throat, and face: Denies mucositis or sore throat Respiratory: Denies cough, dyspnea or wheezes Gastrointestinal:  Denies nausea, heartburn or change in bowel habits Skin: Denies abnormal skin rashes Lymphatics: Denies new lymphadenopathy or easy bruising Neurological:Denies numbness, tingling or new weaknesses Behavioral/Psych: Mood is stable, no new changes  All other systems were reviewed with the patient and are negative.  I have reviewed the past medical history, past surgical history, social history and family history with the patient and they are unchanged from previous note.  ALLERGIES:  is allergic to tramadol, finasteride, jardiance [empagliflozin], lisinopril, ciprofloxacin, simvastatin, and sulfa antibiotics.  MEDICATIONS:  Current Outpatient Medications  Medication Sig Dispense Refill   allopurinol (ZYLOPRIM) 100 MG tablet Take 50 mg by mouth every morning.     Ascorbic Acid (VITAMIN C) 1000 MG tablet Take 4,000 mg by mouth every morning.     aspirin 81 MG chewable tablet Chew 1 tablet (81 mg total) by mouth daily. 30 tablet 11   Blood Glucose Monitoring Suppl (FREESTYLE LITE) w/Device KIT 1 Device by Does not apply route daily in the afternoon. 1 kit 0   carvedilol (COREG) 6.25 MG tablet Take 1 tablet (6.25 mg total) by mouth 2 (two) times daily with a meal. 180 tablet 3   Cholecalciferol (VITAMIN D3 PO) Take 1 capsule by mouth every morning.     colchicine 0.6 MG tablet Take 0.6 mg by mouth daily as needed (gout).     cyanocobalamin 1000 MCG tablet Take 1,000 mcg by mouth every morning.  eplerenone (INSPRA) 50 MG tablet Take 1 tablet (50 mg total) by mouth daily. (Patient taking differently: Take 50 mg by mouth every morning.) 90 tablet 3   ezetimibe (ZETIA) 10 MG tablet Take 1 tablet (10 mg total) by mouth daily. 30 tablet 3   gabapentin  (NEURONTIN) 600 MG tablet Take 300 mg by mouth 2 (two) times daily.     Garlic 1000 MG CAPS Take 1,000 mg by mouth every morning.     glipiZIDE (GLUCOTROL) 5 MG tablet TAKE 1 TABLET(5 MG) BY MOUTH TWICE DAILY BEFORE A MEAL 135 tablet 1   glucose blood (FREESTYLE LITE) test strip 1 each by Other route daily in the afternoon. Use as instructed 100 each 3   hydrALAZINE (APRESOLINE) 50 MG tablet Take 1 tablet (50 mg total) by mouth 3 (three) times daily. 6am, 2pm and 10pm (Patient taking differently: Take 25 mg by mouth 3 (three) times daily. 6am, 2pm and 10pm)     isosorbide mononitrate (IMDUR) 30 MG 24 hr tablet Take 1 tablet (30 mg total) by mouth every morning.     meclizine (ANTIVERT) 25 MG tablet Take 25 mg by mouth 3 (three) times daily as needed for dizziness.     Multiple Vitamin (MULTIVITAMIN WITH MINERALS) TABS tablet Take 1 tablet by mouth every morning.     Omega-3 Fatty Acids (FISH OIL) 1200 MG CAPS Take 1,200 mg by mouth daily.     Oyster Shell Calcium 500 MG TABS Take 500 mg by mouth daily.     pantoprazole (PROTONIX) 40 MG tablet Take 1 tablet (40 mg total) by mouth daily. 30 tablet 2   potassium chloride SA (KLOR-CON M) 20 MEQ tablet TAKE 1 TABLET(20 MEQ) BY MOUTH DAILY. NEED FOLLOW UP APPOINTMENT FOR MORE REFILLS 90 tablet 0   tamsulosin (FLOMAX) 0.4 MG CAPS capsule Take 0.4 mg by mouth 2 (two) times daily.      torsemide (DEMADEX) 20 MG tablet Take 3 tablets (60 mg total) by mouth every morning AND 2 tablets (40 mg total) every evening. 180 tablet 5   triamcinolone cream (KENALOG) 0.1 % Apply 1 Application topically 2 (two) times daily.     No current facility-administered medications for this visit.    SUMMARY OF ONCOLOGIC HISTORY: Oncology History  Smoldering multiple myeloma  10/17/2018 Imaging   1. Interim clearing of congestive heart failure. Persistent cardiomegaly.   2.  No evidence of metastatic disease or myeloma   11/04/2018 Bone Marrow Biopsy   Bone Marrow,  Aspirate,Biopsy, and Clot, right ilium - PLASMA CELL NEOPLASM, SEE COMMENT. - ATYPICAL MEGAKARYOCYTES. PERIPHERAL BLOOD: - NORMOCYTIC ANEMIA. Diagnosis Note The marrow is mildly hypercellular for age with increased plasma cells (15% by CD138 immunohistochemistry). These findings are consistent with a plasma cell neoplasm. Correlation with clinical, radiographic, and laboratory data is required for further classification. There are a few atypical megakaryocytes the significance of which is unclear.  Del 13q noted but normal cytogenetics [place] red stain is negative for amyloid     PHYSICAL EXAMINATION: ECOG PERFORMANCE STATUS: 0 - Asymptomatic  Vitals:   01/02/23 0813  BP: 133/69  Pulse: 78  Resp: 18  Temp: 97.6 F (36.4 C)  SpO2: 95%   Filed Weights   01/02/23 0813  Weight: 222 lb 6.4 oz (100.9 kg)    GENERAL:alert, no distress and comfortable HEART: He has bilateral lower extremity edema NEURO: alert & oriented x 3 with fluent speech, no focal motor/sensory deficits  LABORATORY DATA:  I have reviewed  the data as listed    Component Value Date/Time   NA 141 12/11/2022 1156   NA 143 07/01/2018 1233   K 4.0 12/11/2022 1156   CL 103 12/11/2022 1156   CO2 32 12/11/2022 1156   GLUCOSE 138 (H) 12/11/2022 1156   BUN 35 (H) 12/11/2022 1156   BUN 27 07/01/2018 1233   CREATININE 2.73 (H) 12/11/2022 1156   CREATININE 2.06 (H) 10/10/2018 1404   CALCIUM 10.2 12/11/2022 1156   PROT 7.2 12/11/2022 1156   ALBUMIN 3.8 12/11/2022 1156   AST 18 12/11/2022 1156   AST 20 10/10/2018 1404   ALT 12 12/11/2022 1156   ALT 16 10/10/2018 1404   ALKPHOS 51 12/11/2022 1156   BILITOT 0.4 12/11/2022 1156   BILITOT 0.4 10/10/2018 1404   GFRNONAA 22 (L) 12/11/2022 1156   GFRNONAA 29 (L) 10/10/2018 1404   GFRAA 26 (L) 12/02/2019 1108   GFRAA 34 (L) 10/10/2018 1404    Lab Results  Component Value Date   SPEP Comment 10/10/2018    Lab Results  Component Value Date   WBC 4.7  12/11/2022   NEUTROABS 2.8 12/11/2022   HGB 11.0 (L) 12/11/2022   HCT 33.3 (L) 12/11/2022   MCV 102.1 (H) 12/11/2022   PLT 164 12/11/2022      Chemistry      Component Value Date/Time   NA 141 12/11/2022 1156   NA 143 07/01/2018 1233   K 4.0 12/11/2022 1156   CL 103 12/11/2022 1156   CO2 32 12/11/2022 1156   BUN 35 (H) 12/11/2022 1156   BUN 27 07/01/2018 1233   CREATININE 2.73 (H) 12/11/2022 1156   CREATININE 2.06 (H) 10/10/2018 1404      Component Value Date/Time   CALCIUM 10.2 12/11/2022 1156   ALKPHOS 51 12/11/2022 1156   AST 18 12/11/2022 1156   AST 20 10/10/2018 1404   ALT 12 12/11/2022 1156   ALT 16 10/10/2018 1404   BILITOT 0.4 12/11/2022 1156   BILITOT 0.4 10/10/2018 1404

## 2023-01-02 NOTE — Assessment & Plan Note (Signed)
He has intermittent elevated kidney function on and off over the years We discussed importance of adequate hydration We discussed limitations and difficulties in interpreting his light chain level due to elevated serum creatinine I recommend the patient to increase oral fluid hydration as tolerated

## 2023-01-02 NOTE — Assessment & Plan Note (Signed)
Overall, I do not believe he has disease progression The elevated serum light chains are likely due to recent changes in his renal function I recommend repeat blood work next year for further follow-up He is in agreement

## 2023-01-03 DIAGNOSIS — I5022 Chronic systolic (congestive) heart failure: Secondary | ICD-10-CM | POA: Diagnosis not present

## 2023-01-03 DIAGNOSIS — E7800 Pure hypercholesterolemia, unspecified: Secondary | ICD-10-CM | POA: Diagnosis not present

## 2023-01-03 DIAGNOSIS — N184 Chronic kidney disease, stage 4 (severe): Secondary | ICD-10-CM | POA: Diagnosis not present

## 2023-01-03 DIAGNOSIS — E1169 Type 2 diabetes mellitus with other specified complication: Secondary | ICD-10-CM | POA: Diagnosis not present

## 2023-01-03 DIAGNOSIS — C9000 Multiple myeloma not having achieved remission: Secondary | ICD-10-CM | POA: Diagnosis not present

## 2023-01-03 DIAGNOSIS — R42 Dizziness and giddiness: Secondary | ICD-10-CM | POA: Diagnosis not present

## 2023-01-03 DIAGNOSIS — Z79899 Other long term (current) drug therapy: Secondary | ICD-10-CM | POA: Diagnosis not present

## 2023-01-11 DIAGNOSIS — E1169 Type 2 diabetes mellitus with other specified complication: Secondary | ICD-10-CM | POA: Diagnosis not present

## 2023-01-11 DIAGNOSIS — N184 Chronic kidney disease, stage 4 (severe): Secondary | ICD-10-CM | POA: Diagnosis not present

## 2023-01-11 DIAGNOSIS — Z0001 Encounter for general adult medical examination with abnormal findings: Secondary | ICD-10-CM | POA: Diagnosis not present

## 2023-01-11 DIAGNOSIS — Z79899 Other long term (current) drug therapy: Secondary | ICD-10-CM | POA: Diagnosis not present

## 2023-01-11 DIAGNOSIS — I5022 Chronic systolic (congestive) heart failure: Secondary | ICD-10-CM | POA: Diagnosis not present

## 2023-01-11 DIAGNOSIS — Z23 Encounter for immunization: Secondary | ICD-10-CM | POA: Diagnosis not present

## 2023-01-11 DIAGNOSIS — E7800 Pure hypercholesterolemia, unspecified: Secondary | ICD-10-CM | POA: Diagnosis not present

## 2023-01-18 ENCOUNTER — Telehealth: Payer: Self-pay | Admitting: Cardiology

## 2023-01-18 DIAGNOSIS — I10 Essential (primary) hypertension: Secondary | ICD-10-CM

## 2023-01-18 DIAGNOSIS — I5022 Chronic systolic (congestive) heart failure: Secondary | ICD-10-CM

## 2023-01-18 DIAGNOSIS — G4733 Obstructive sleep apnea (adult) (pediatric): Secondary | ICD-10-CM

## 2023-01-18 NOTE — Telephone Encounter (Signed)
Patient stated he wants to get orders for supplies for his CPAP machine.

## 2023-01-24 ENCOUNTER — Ambulatory Visit: Payer: Self-pay

## 2023-01-24 NOTE — Patient Instructions (Signed)
Visit Information  Thank you for taking time to visit with me today. Please don't hesitate to contact me if I can be of assistance to you.   Following are the goals we discussed today:   Goals Addressed             This Visit's Progress    Health Maintenance       Patient Goals/Self Care Activities: -Patient/Caregiver will self-administer medications as prescribed as evidenced by self-report/primary caregiver report  -Patient/Caregiver will attend all scheduled provider appointments as evidenced by clinician review of documented attendance to scheduled appointments and patient/caregiver report  -Calls provider office for new concerns, questions, or BP outside discussed parameters -Checks BP and records as discussed -Follows a low sodium diet/DASH diet -check blood sugar at prescribed times -record values and write them down take them to all doctor visits  -check feet daily for cuts, sores or redness -trim toenails straight across -wash and dry feet carefully every day -wear comfortable, cotton socks -wear comfortable, well-fitting shoes -Adhere to low sodium diet   Patient reports doing great.  Blood sugars 110-120. Wt 208-212 lbs.  Discussed Diabetes and HF management.  No concerns.          Our next appointment is by telephone on 04/25/22 at 1000 am  Please call the care guide team at [phone] if you need to cancel or reschedule your appointment.   If you are experiencing a Mental Health or Behavioral Health Crisis or need someone to talk to, please call the Suicide and Crisis Lifeline: 988   Patient verbalizes understanding of instructions and care plan provided today and agrees to view in MyChart. Active MyChart status and patient understanding of how to access instructions and care plan via MyChart confirmed with patient.     The patient has been provided with contact information for the care management team and has been advised to call with any health related  questions or concerns.   Bary Leriche, RN, MSN [facility], [facility] Management Community Coordinator Direct Dial: [phone]  Fax: [phone] Website: [email or web address]

## 2023-01-24 NOTE — Patient Outreach (Signed)
Care Coordination   Follow Up Visit Note   01/24/2023 Name: Allen Dyer MRN: [id number] DOB: Dec 21, 1935  Allen Dyer is a 87 y.o. year old male who sees Koirala, Dibas, MD for primary care. I spoke with  Michaelene Song by phone today.  What matters to the patients health and wellness today?  Maintain health    Goals Addressed             This Visit's Progress    Health Maintenance       Patient Goals/Self Care Activities: -Patient/Caregiver will self-administer medications as prescribed as evidenced by self-report/primary caregiver report  -Patient/Caregiver will attend all scheduled provider appointments as evidenced by clinician review of documented attendance to scheduled appointments and patient/caregiver report  -Calls provider office for new concerns, questions, or BP outside discussed parameters -Checks BP and records as discussed -Follows a low sodium diet/DASH diet -check blood sugar at prescribed times -record values and write them down take them to all doctor visits  -check feet daily for cuts, sores or redness -trim toenails straight across -wash and dry feet carefully every day -wear comfortable, cotton socks -wear comfortable, well-fitting shoes -Adhere to low sodium diet   Patient reports doing great.  Blood sugars 110-120. Wt 208-212 lbs.  Discussed Diabetes and HF management.  No concerns.          SDOH assessments and interventions completed:  Yes  SDOH Interventions Today    Flowsheet Row Most Recent Value  SDOH Interventions   Transportation Interventions Intervention Not Indicated  Health Literacy Interventions Intervention Not Indicated        Care Coordination Interventions:  Yes, provided   Follow up plan: Follow up call scheduled for January    Encounter Outcome:  Patient Visit Completed    Bary Leriche, RN, MSN [place]  [facility], [facility] Management Community Coordinator Direct Dial:  [phone]  Fax: [phone] Website: [email or web address]

## 2023-01-25 NOTE — Telephone Encounter (Signed)
Reached out to [employer] to follow up on patients supplies. Christoper Allegra had called the patient and left a message for him to call back but the patient never got the call because he thought it was a spam call. He will call and get his supplies now that I gave him the right number for resupply [phone].

## 2023-02-08 ENCOUNTER — Other Ambulatory Visit (HOSPITAL_COMMUNITY): Payer: Self-pay | Admitting: Cardiology

## 2023-03-01 ENCOUNTER — Encounter: Payer: Self-pay | Admitting: Podiatry

## 2023-03-01 ENCOUNTER — Ambulatory Visit: Payer: Medicare Other | Admitting: Podiatry

## 2023-03-01 DIAGNOSIS — M79675 Pain in left toe(s): Secondary | ICD-10-CM

## 2023-03-01 DIAGNOSIS — B351 Tinea unguium: Secondary | ICD-10-CM | POA: Diagnosis not present

## 2023-03-01 DIAGNOSIS — M79674 Pain in right toe(s): Secondary | ICD-10-CM

## 2023-03-01 DIAGNOSIS — Q828 Other specified congenital malformations of skin: Secondary | ICD-10-CM

## 2023-03-01 DIAGNOSIS — E1159 Type 2 diabetes mellitus with other circulatory complications: Secondary | ICD-10-CM

## 2023-03-01 NOTE — Progress Notes (Signed)
This patient returns to my office for at risk foot care.  This patient requires this care by a professional since this patient will be at risk due to having  Diabetes and chronic kidney disease.   This patient is unable to cut nails himself  since the patient cannot reach his  nails.These nails are painful walking and wearing shoes.   This patient presents for at risk foot care today.    General Appearance  Alert, conversant and in no acute stress.  Vascular  Dorsalis pedis and posterior tibial  pulses are palpable  bilaterally.  Capillary return is within normal limits  bilaterally. Temperature is within normal limits  bilaterally.  Neurologic  Senn-Weinstein monofilament wire test diminished   bilaterally. Muscle power within normal limits bilaterally.  Nails Thick disfigured discolored nails with subungual debris  from hallux to fifth toes bilaterally. No evidence of bacterial infection or drainage bilaterally.  Orthopedic  No limitations of motion  feet .  No crepitus or effusions noted.  No bony pathology or digital deformities noted.  Arthritis right foot/ankle.  Skin  normotropic skin with no porokeratosis noted bilaterally.  No signs of infections or ulcers noted.   Symptomatic porokeratosis sub 5th  B/L.   Onychomycosis  Pain in right toes  Pain in left toes  Porokeratosis  B/L.  Consent was obtained for treatment procedures.   Mechanical debridement of nails 1-5  bilaterally performed with a nail nipper.  Filed with dremel without incident. No infection or ulcer.  Debride callus  B/L.   Return office visit   3 months       Told patient to return for periodic foot care and evaluation due to potential at risk complications.   Helane Gunther DPM

## 2023-03-08 DIAGNOSIS — N1831 Chronic kidney disease, stage 3a: Secondary | ICD-10-CM | POA: Diagnosis not present

## 2023-03-08 DIAGNOSIS — R42 Dizziness and giddiness: Secondary | ICD-10-CM | POA: Diagnosis not present

## 2023-03-08 DIAGNOSIS — N184 Chronic kidney disease, stage 4 (severe): Secondary | ICD-10-CM | POA: Diagnosis not present

## 2023-03-08 DIAGNOSIS — H6123 Impacted cerumen, bilateral: Secondary | ICD-10-CM | POA: Diagnosis not present

## 2023-03-09 ENCOUNTER — Ambulatory Visit (INDEPENDENT_AMBULATORY_CARE_PROVIDER_SITE_OTHER): Payer: Medicare Other | Admitting: Internal Medicine

## 2023-03-09 ENCOUNTER — Encounter: Payer: Self-pay | Admitting: Internal Medicine

## 2023-03-09 VITALS — BP 126/80 | HR 72 | Ht 67.0 in | Wt 218.0 lb

## 2023-03-09 DIAGNOSIS — N184 Chronic kidney disease, stage 4 (severe): Secondary | ICD-10-CM | POA: Diagnosis not present

## 2023-03-09 DIAGNOSIS — E1122 Type 2 diabetes mellitus with diabetic chronic kidney disease: Secondary | ICD-10-CM

## 2023-03-09 DIAGNOSIS — Z7984 Long term (current) use of oral hypoglycemic drugs: Secondary | ICD-10-CM

## 2023-03-09 LAB — POCT GLYCOSYLATED HEMOGLOBIN (HGB A1C): Hemoglobin A1C: 7.1 % — AB (ref 4.0–5.6)

## 2023-03-09 LAB — POCT GLUCOSE (DEVICE FOR HOME USE): POC Glucose: 214 mg/dL — AB (ref 70–99)

## 2023-03-09 MED ORDER — GLIPIZIDE 5 MG PO TABS: ORAL_TABLET | ORAL | 2 refills | Status: DC

## 2023-03-09 NOTE — Progress Notes (Signed)
Name: Allen Dyer  Age/ Sex: 87 y.o., male   MRN/ DOB: [id number], 03-28-1936     PCP: Darrow Bussing, MD   Reason for Endocrinology Evaluation: Type 2 Diabetes Mellitus  Initial Endocrine Consultative Visit: 06/21/2020    PATIENT IDENTIFIER: Allen Dyer is a 87 y.o. male with a past medical history of T2DM, HTN, Neuropathy, CHF (04/2018). The patient has followed with Endocrinology clinic since 06/21/2020 for consultative assistance with management of his diabetes.  DIABETIC HISTORY:  Allen Dyer was diagnosed with DM > 30 yrs.Intolerant to Jardiance due to yeats infections .Started Rybelsus 05/2020 but discontinued due to cost  . His hemoglobin A1c has ranged from 6.6% in 2020, peaking at 8.1% .   Patient recalls developing side effect of Rybelsus but he was unable to recall the exact side effect November/2024  SUBJECTIVE:   During the last visit (09/05/2022): A1c 6.7%      Today (03/09/2023): Allen Dyer is here for a follow up on diabetes management . He checks his blood sugars occasionally. The patient has not had hypoglycemic episodes .   Patient continues to follow-up with podiatry 03/01/2023 Denies nausea, vomiting  Denies  diarrhea has occasional constipation  Has noted dizziness for the past 2 months , worse in past 2 weeks , was evaluated by PCP , pending OT evaluation    HOME DIABETES REGIMEN:  Glipizide 5 mg ,BID    Statin: no ACE-I/ARB: no     METER DOWNLOAD SUMMARY: n/a       DIABETIC COMPLICATIONS: Microvascular complications:  Neuropathy, CKD IV Denies: retinopathy Last Eye Exam: Completed 04/2022  Macrovascular complications:  CHF, CVA 08/2021 Denies: CAD, PVD   HISTORY:  Past Medical History:  Past Medical History:  Diagnosis Date   Chronic combined systolic and diastolic CHF (congestive heart failure) (HCC)    CKD (chronic kidney disease), stage III (HCC)    Hyperlipidemia    Hypertension    Lower extremity edema    Mild CAD     a. mild-mod by cath 05/2018.   Multiple myeloma (HCC) 11/15/2018   NICM (nonischemic cardiomyopathy) (HCC)    Peripheral neuropathy    Prostate cancer (HCC)    Status post XRT   Rheumatic fever    Trifascicular block    Type 2 diabetes mellitus (HCC)    Past Surgical History:  Past Surgical History:  Procedure Laterality Date   RIGHT HEART CATH N/A 07/10/2018   Procedure: RIGHT HEART CATH;  Surgeon: Laurey Morale, MD;  Location: [facility] INVASIVE CV LAB;  Service: Cardiovascular;  Laterality: N/A;   RIGHT/LEFT HEART CATH AND CORONARY ANGIOGRAPHY N/A 06/18/2018   Procedure: RIGHT/LEFT HEART CATH AND CORONARY ANGIOGRAPHY;  Surgeon: Lennette Bihari, MD;  Location: [facility] INVASIVE CV LAB;  Service: Cardiovascular;  Laterality: N/A;   Social History:  reports that he quit smoking about 51 years ago. His smoking use included cigarettes. He started smoking about 61 years ago. He has a 20 pack-year smoking history. He has never used smokeless tobacco. He reports that he does not currently use alcohol. He reports that he does not use drugs. Family History:  Family History  Problem Relation Age of Onset   Cancer Mother    Congestive Heart Failure Father        died from heart failure at the age of 53   Cancer Sister    Congestive Heart Failure Brother        died from heart failure at the age of 31  Stroke Neg Hx      HOME MEDICATIONS: Allergies as of 03/09/2023       Reactions   Tramadol Other (See Comments)   Dizziness (intolerance) hallucinate   Finasteride Swelling   Breast enlargement   Jardiance [empagliflozin]    Yeast infection   Lisinopril Cough   Ciprofloxacin Other (See Comments)   Dizziness (intolerance)   Simvastatin Hives, Other (See Comments)   Blisters/ Rash   Sulfa Antibiotics Rash        Medication List        Accurate as of March 09, 2023 11:07 AM. If you have any questions, ask your nurse or doctor.          allopurinol 100 MG tablet Commonly known  as: ZYLOPRIM Take 50 mg by mouth every morning.   Aspirin Low Dose 81 MG chewable tablet Generic drug: aspirin Chew 1 tablet (81 mg total) by mouth daily.   carvedilol 6.25 MG tablet Commonly known as: COREG Take 1 tablet (6.25 mg total) by mouth 2 (two) times daily with a meal.   carvedilol 12.5 MG tablet Commonly known as: COREG Take by mouth.   colchicine 0.6 MG tablet Take 0.6 mg by mouth daily as needed (gout).   cyanocobalamin 1000 MCG tablet Take 1,000 mcg by mouth every morning.   empagliflozin 25 MG Tabs tablet Commonly known as: JARDIANCE Take by mouth.   eplerenone 50 MG tablet Commonly known as: INSPRA Take 1 tablet (50 mg total) by mouth daily. What changed: when to take this   ezetimibe 10 MG tablet Commonly known as: ZETIA Take 1 tablet (10 mg total) by mouth daily.   Fish Oil 1200 MG Caps Take 1,200 mg by mouth daily.   FREESTYLE LITE test strip Generic drug: glucose blood 1 each by Other route daily in the afternoon. Use as instructed   FreeStyle Lite w/Device Kit 1 Device by Does not apply route daily in the afternoon.   gabapentin 600 MG tablet Commonly known as: NEURONTIN Take 300 mg by mouth 2 (two) times daily.   Garlic 1000 MG Caps Take 1,000 mg by mouth every morning.   glipiZIDE 5 MG tablet Commonly known as: GLUCOTROL TAKE 1 TABLET(5 MG) BY MOUTH TWICE DAILY BEFORE A MEAL   hydrALAZINE 25 MG tablet Commonly known as: APRESOLINE Take 25 mg by mouth 3 (three) times daily. What changed: Another medication with the same name was changed. Make sure you understand how and when to take each.   hydrALAZINE 50 MG tablet Commonly known as: APRESOLINE Take 1 tablet (50 mg total) by mouth 3 (three) times daily. 6am, 2pm and 10pm What changed: how much to take   isosorbide mononitrate 30 MG 24 hr tablet Commonly known as: IMDUR Take 1 tablet (30 mg total) by mouth every morning.   meclizine 25 MG tablet Commonly known as:  ANTIVERT Take 25 mg by mouth 3 (three) times daily as needed for dizziness.   multivitamin with minerals Tabs tablet Take 1 tablet by mouth every morning.   Oyster Shell Calcium 500 MG Tabs Take 500 mg by mouth daily.   pantoprazole 40 MG tablet Commonly known as: PROTONIX Take 1 tablet (40 mg total) by mouth daily.   potassium chloride SA 20 MEQ tablet Commonly known as: KLOR-CON M TAKE 1 TABLET(20 MEQ) BY MOUTH DAILY. NEED FOLLOW UP APPOINTMENT FOR MORE REFILLS   tamsulosin 0.4 MG Caps capsule Commonly known as: FLOMAX Take 0.4 mg by mouth 2 (two) times daily.  torsemide 20 MG tablet Commonly known as: DEMADEX Take 3 tablets (60 mg total) by mouth every morning AND 2 tablets (40 mg total) every evening.   triamcinolone cream 0.1 % Commonly known as: KENALOG Apply 1 Application topically 2 (two) times daily.   vitamin C 1000 MG tablet Take 4,000 mg by mouth every morning.   VITAMIN D3 PO Take 1 capsule by mouth every morning.         OBJECTIVE:   Vital Signs:BP 126/80 (BP Location: Left Arm, Patient Position: Sitting, Cuff Size: Large)   Pulse 72   Ht 5\' 7"  (1.702 m)   Wt 218 lb (98.9 kg)   SpO2 96%   BMI 34.14 kg/m   Wt Readings from Last 3 Encounters:  03/09/23 218 lb (98.9 kg)  01/02/23 222 lb 6.4 oz (100.9 kg)  09/05/22 213 lb (96.6 kg)     Exam: General: Pt appears well and is in NAD  Lungs: Clear with good BS bilat   Heart: RRR   Extremities: Trace  pretibial edema. .  Neuro: MS is good with appropriate affect, pt is alert and Ox3    DM foot exam: 03/01/2023 Per podiatry     DATA REVIEWED:  Lab Results  Component Value Date   HGBA1C 6.7 (A) 09/05/2022   HGBA1C 8.1 (A) 04/26/2022   HGBA1C 6.7 (H) 09/12/2021   01/11/2023 through outside records A1c 7.1% Cr 2.51 BUN 33 GFR 24 Tg 144 HDL 40 LDL 65 GFR 27   ASSESSMENT / PLAN / RECOMMENDATIONS:   1) Type 2 Diabetes Mellitus, Sub- OPtimally controlled, With CKD IV, neuropathic  and macrovascular complications - Most recent A1c of 7.1  %. Goal A1c < 7.5 %.    -A1c has trended up from 6.7% to 7.1%, given that the holidays around the corner and he tends to increase oral intake around that time, I have recommended increasing glipizide in the morning as below -His in office postprandial BG 214 mg/DL -Intolerant to Jardiance due to yeast infections -Rybelsus has been cost prohibitive, but during visit 02/2023 patient does endorse having side effects but could not remember the exact side effect to Rybelsus   MEDICATIONS: - Change Glipizide 5 mg ,2 tablets before Breakfast  and 1 tablet before Supper      EDUCATION / INSTRUCTIONS: BG monitoring instructions: Patient is instructed to check his blood sugars 1 times a day, fasting . Call [facility] if: BG persistently < 70 . I reviewed the Rule of 15 for the treatment of hypoglycemia in detail with the patient. Literature supplied.     2) Diabetic complications:  Eye: Does not have known diabetic retinopathy.  Neuro/ Feet: Does have known diabetic peripheral neuropathy .  Renal: Patient does  have known baseline CKD. He   is not on an ACEI/ARB at present. Will defer to nephrology     F/U in 6 months    Signed electronically by: Lyndle Herrlich, MD  [facility]  [facility] [address] [place], [place] [address] Phone: [phone] FAX: [phone]   CC: Darrow Bussing, MD [address] [place] [address] Phone: [phone]  Fax: [phone]  Return to Endocrinology clinic as below: Future Appointments  Date Time Provider Department Center  03/09/2023 11:10 AM Edie Darley, Konrad Dolores, MD [facility] None  04/26/2023 10:00 AM Fleeta Emmer, RN [facility] None  06/01/2023 12:00 PM Helane Gunther, DPM [facility]  01/03/2024  8:15 AM [facility]  None  01/03/2024  8:40 AM [name], MD  [facility] None

## 2023-03-09 NOTE — Patient Instructions (Addendum)
Glipizide 5 mg, 2 tablet before Breakfast and 1 tablet before supper  Take tamsulosin 1 tablet at bedtime only     HOW TO TREAT LOW BLOOD SUGARS (Blood sugar LESS THAN 70 MG/DL) Please follow the RULE OF 15 for the treatment of hypoglycemia treatment (when your (blood sugars are less than 70 mg/dL)   STEP 1: Take 15 grams of carbohydrates when your blood sugar is low, which includes:  3-4 GLUCOSE TABS  OR 3-4 OZ OF JUICE OR REGULAR SODA OR ONE TUBE OF GLUCOSE GEL    STEP 2: RECHECK blood sugar in 15 MINUTES STEP 3: If your blood sugar is still low at the 15 minute recheck --> then, go back to STEP 1 and treat AGAIN with another 15 grams of carbohydrates.

## 2023-03-14 DIAGNOSIS — N2581 Secondary hyperparathyroidism of renal origin: Secondary | ICD-10-CM | POA: Diagnosis not present

## 2023-03-14 DIAGNOSIS — D472 Monoclonal gammopathy: Secondary | ICD-10-CM | POA: Diagnosis not present

## 2023-03-14 DIAGNOSIS — N184 Chronic kidney disease, stage 4 (severe): Secondary | ICD-10-CM | POA: Diagnosis not present

## 2023-03-14 DIAGNOSIS — I129 Hypertensive chronic kidney disease with stage 1 through stage 4 chronic kidney disease, or unspecified chronic kidney disease: Secondary | ICD-10-CM | POA: Diagnosis not present

## 2023-03-14 DIAGNOSIS — I5020 Unspecified systolic (congestive) heart failure: Secondary | ICD-10-CM | POA: Diagnosis not present

## 2023-03-14 DIAGNOSIS — E1122 Type 2 diabetes mellitus with diabetic chronic kidney disease: Secondary | ICD-10-CM | POA: Diagnosis not present

## 2023-04-02 ENCOUNTER — Other Ambulatory Visit (HOSPITAL_COMMUNITY): Payer: Self-pay | Admitting: Cardiology

## 2023-04-03 ENCOUNTER — Telehealth: Payer: Self-pay | Admitting: Cardiology

## 2023-04-03 DIAGNOSIS — G4733 Obstructive sleep apnea (adult) (pediatric): Secondary | ICD-10-CM

## 2023-04-03 NOTE — Telephone Encounter (Signed)
PAP order sent to [facility] and inquired about missing documentation.

## 2023-04-03 NOTE — Telephone Encounter (Signed)
Patient's daughter states the patient has been without a CPAP mask for 6 months and they need to have it ordered ASAP.

## 2023-04-05 DIAGNOSIS — H6123 Impacted cerumen, bilateral: Secondary | ICD-10-CM | POA: Diagnosis not present

## 2023-04-06 DIAGNOSIS — E113291 Type 2 diabetes mellitus with mild nonproliferative diabetic retinopathy without macular edema, right eye: Secondary | ICD-10-CM | POA: Diagnosis not present

## 2023-04-11 DIAGNOSIS — R42 Dizziness and giddiness: Secondary | ICD-10-CM | POA: Diagnosis not present

## 2023-04-11 DIAGNOSIS — N184 Chronic kidney disease, stage 4 (severe): Secondary | ICD-10-CM | POA: Diagnosis not present

## 2023-04-11 DIAGNOSIS — I5022 Chronic systolic (congestive) heart failure: Secondary | ICD-10-CM | POA: Diagnosis not present

## 2023-04-18 ENCOUNTER — Emergency Department (HOSPITAL_COMMUNITY)
Admission: EM | Admit: 2023-04-18 | Discharge: 2023-04-19 | Disposition: A | Discharge status: Home / Self Care | Payer: Medicare Other | Attending: Student | Admitting: Student

## 2023-04-18 ENCOUNTER — Other Ambulatory Visit: Payer: Self-pay

## 2023-04-18 ENCOUNTER — Encounter (HOSPITAL_COMMUNITY): Payer: Self-pay

## 2023-04-18 DIAGNOSIS — N189 Chronic kidney disease, unspecified: Secondary | ICD-10-CM | POA: Insufficient documentation

## 2023-04-18 DIAGNOSIS — Z7982 Long term (current) use of aspirin: Secondary | ICD-10-CM | POA: Diagnosis not present

## 2023-04-18 DIAGNOSIS — N3001 Acute cystitis with hematuria: Secondary | ICD-10-CM

## 2023-04-18 DIAGNOSIS — I130 Hypertensive heart and chronic kidney disease with heart failure and stage 1 through stage 4 chronic kidney disease, or unspecified chronic kidney disease: Secondary | ICD-10-CM | POA: Insufficient documentation

## 2023-04-18 DIAGNOSIS — I509 Heart failure, unspecified: Secondary | ICD-10-CM | POA: Diagnosis not present

## 2023-04-18 DIAGNOSIS — Z79899 Other long term (current) drug therapy: Secondary | ICD-10-CM | POA: Insufficient documentation

## 2023-04-18 DIAGNOSIS — R339 Retention of urine, unspecified: Secondary | ICD-10-CM | POA: Diagnosis not present

## 2023-04-18 DIAGNOSIS — Z8546 Personal history of malignant neoplasm of prostate: Secondary | ICD-10-CM | POA: Insufficient documentation

## 2023-04-18 DIAGNOSIS — I2510 Atherosclerotic heart disease of native coronary artery without angina pectoris: Secondary | ICD-10-CM | POA: Insufficient documentation

## 2023-04-18 DIAGNOSIS — E1122 Type 2 diabetes mellitus with diabetic chronic kidney disease: Secondary | ICD-10-CM | POA: Insufficient documentation

## 2023-04-18 DIAGNOSIS — Z7984 Long term (current) use of oral hypoglycemic drugs: Secondary | ICD-10-CM | POA: Diagnosis not present

## 2023-04-18 DIAGNOSIS — K5900 Constipation, unspecified: Secondary | ICD-10-CM | POA: Diagnosis not present

## 2023-04-18 DIAGNOSIS — Z87440 Personal history of urinary (tract) infections: Secondary | ICD-10-CM | POA: Diagnosis not present

## 2023-04-18 DIAGNOSIS — R531 Weakness: Secondary | ICD-10-CM | POA: Diagnosis not present

## 2023-04-18 NOTE — ED Triage Notes (Signed)
Pt is coming in with reports of urinary retention, he is coming from home, states the urinary retention started 8hrs ago, He is a stage 4 CKD, and Hx of prostate cancer. Pt states he feels in his lower abd region.   Medic Vitals   162/110 95%ra 94hr 20rr [id number]

## 2023-04-18 NOTE — ED Provider Notes (Signed)
[place] EMERGENCY DEPARTMENT AT [facility] Provider Note   CSN: [id number] Arrival date & time: 04/18/23  2235     History  Chief Complaint  Patient presents with   Urinary Retention    Glenwood Pendelton is a 87 y.o. male with history of hypertension, type 2 diabetes, prostate cancer s/p radiation, peripheral neuropathy, chronic kidney disease, hyperlipidemia, CHF, CAD, multiple myeloma, who presents to the emergency department complaining of urinary retention.  Patient states he has not been able to urinate for the past 8 hours or so.  He is complaining of pain in his lower abdominal region.  Daughter at bedside states that he has been struggling with urinating for several months, he does follow with [facility].  Has not needed a catheter during this time.  HPI     Home Medications Prior to Admission medications   Medication Sig Start Date End Date Taking? Authorizing Provider  allopurinol (ZYLOPRIM) 100 MG tablet Take 50 mg by mouth every morning. 10/11/20   [provider]  Ascorbic Acid (VITAMIN C) 1000 MG tablet Take 4,000 mg by mouth every morning.    [provider]  aspirin 81 MG chewable tablet Chew 1 tablet (81 mg total) by mouth daily. 09/14/21   Leroy Sea, MD  Blood Glucose Monitoring Suppl (FREESTYLE LITE) w/Device KIT 1 Device by Does not apply route daily in the afternoon. 05/24/21   Shamleffer, Konrad Dolores, MD  carvedilol (COREG) 12.5 MG tablet Take by mouth. 12/05/22   [provider]  carvedilol (COREG) 6.25 MG tablet Take 1 tablet (6.25 mg total) by mouth 2 (two) times daily with a meal. 01/16/22   Laurey Morale, MD  Cholecalciferol (VITAMIN D3 PO) Take 1 capsule by mouth every morning.    [provider]  colchicine 0.6 MG tablet Take 0.6 mg by mouth daily as needed (gout).    [provider]  cyanocobalamin 1000 MCG tablet Take 1,000 mcg by mouth every morning.    [provider]   eplerenone (INSPRA) 50 MG tablet Take 1 tablet (50 mg total) by mouth daily. Patient taking differently: Take 50 mg by mouth every morning. 06/23/21   Laurey Morale, MD  ezetimibe (ZETIA) 10 MG tablet Take 1 tablet (10 mg total) by mouth daily. 09/14/21   Leroy Sea, MD  gabapentin (NEURONTIN) 600 MG tablet Take 300 mg by mouth 2 (two) times daily. 04/28/20   [provider]  Garlic 1000 MG CAPS Take 1,000 mg by mouth every morning.    [provider]  glipiZIDE (GLUCOTROL) 5 MG tablet Take 2 tablets (10 mg total) by mouth daily before breakfast AND 1 tablet (5 mg total) daily before supper. 03/09/23   Shamleffer, Konrad Dolores, MD  glucose blood (FREESTYLE LITE) test strip 1 each by Other route daily in the afternoon. Use as instructed 05/24/21   Shamleffer, Konrad Dolores, MD  hydrALAZINE (APRESOLINE) 25 MG tablet Take 25 mg by mouth 3 (three) times daily.    [provider]  hydrALAZINE (APRESOLINE) 50 MG tablet Take 1 tablet (50 mg total) by mouth 3 (three) times daily. 6am, 2pm and 10pm Patient taking differently: Take 25 mg by mouth 3 (three) times daily. 6am, 2pm and 10pm 09/15/21   Leroy Sea, MD  isosorbide mononitrate (IMDUR) 30 MG 24 hr tablet Take 1 tablet (30 mg total) by mouth every morning. 09/14/21   Leroy Sea, MD  meclizine (ANTIVERT) 25 MG tablet Take 25  mg by mouth 3 (three) times daily as needed for dizziness.    [provider]  Multiple Vitamin (MULTIVITAMIN WITH MINERALS) TABS tablet Take 1 tablet by mouth every morning.    [provider]  Omega-3 Fatty Acids (FISH OIL) 1200 MG CAPS Take 1,200 mg by mouth daily.    [provider]  Ethelda Chick Calcium 500 MG TABS Take 500 mg by mouth daily.    [provider]  pantoprazole (PROTONIX) 40 MG tablet Take 1 tablet (40 mg total) by mouth daily. 09/14/21   Leroy Sea, MD  potassium chloride SA (KLOR-CON M) 20 MEQ tablet TAKE 1 TABLET(20 MEQ)  BY MOUTH DAILY. NEED FOLLOW UP APPOINTMENT FOR MORE REFILLS 04/02/23   Laurey Morale, MD  tamsulosin (FLOMAX) 0.4 MG CAPS capsule Take 0.4 mg by mouth 2 (two) times daily.  03/24/17   [provider]  torsemide (DEMADEX) 20 MG tablet Take 3 tablets (60 mg total) by mouth every morning AND 2 tablets (40 mg total) every evening. 01/16/22   Laurey Morale, MD  triamcinolone cream (KENALOG) 0.1 % Apply 1 Application topically 2 (two) times daily.    [provider]      Allergies    Tramadol, Finasteride, Jardiance [empagliflozin], Lisinopril, Ciprofloxacin, Simvastatin, and Sulfa antibiotics    Review of Systems   Review of Systems  Gastrointestinal:  Positive for abdominal pain.  Genitourinary:  Positive for difficulty urinating.  All other systems reviewed and are negative.   Physical Exam Updated Vital Signs BP (!) 145/82   Pulse 92   Temp 98 F (36.7 C) (Oral)   Resp 17   SpO2 98%  Physical Exam Vitals and nursing note reviewed. Exam conducted with a chaperone present.  Constitutional:      Appearance: Normal appearance.  HENT:     Head: Normocephalic and atraumatic.  Eyes:     Conjunctiva/sclera: Conjunctivae normal.  Pulmonary:     Effort: Pulmonary effort is normal. No respiratory distress.  Abdominal:     General: Abdomen is flat.     Palpations: Abdomen is soft.     Tenderness: There is abdominal tenderness in the suprapubic area.  Genitourinary:    Penis: Normal and uncircumcised.      Testes: Normal.  Skin:    General: Skin is warm and dry.  Neurological:     Mental Status: He is alert.  Psychiatric:        Mood and Affect: Mood normal.        Behavior: Behavior normal.     ED Results / Procedures / Treatments   Labs (all labs ordered are listed, but only abnormal results are displayed) Labs Reviewed  COMPREHENSIVE METABOLIC PANEL - Abnormal; Notable for the following components:      Result Value   Glucose, Bld 227 (*)    BUN 31  (*)    Creatinine, Ser 2.58 (*)    GFR, Estimated 24 (*)    All other components within normal limits  CBC WITH DIFFERENTIAL/PLATELET - Abnormal; Notable for the following components:   RBC 3.71 (*)    Hemoglobin 12.2 (*)    HCT 37.6 (*)    MCV 101.3 (*)    All other components within normal limits  URINALYSIS, ROUTINE W REFLEX MICROSCOPIC - Abnormal; Notable for the following components:   APPearance HAZY (*)    Glucose, UA 50 (*)    Hgb urine dipstick MODERATE (*)    Leukocytes,Ua LARGE (*)  Bacteria, UA FEW (*)    All other components within normal limits  URINE CULTURE    EKG None  Radiology No results found.  Procedures Procedures    Medications Ordered in ED Medications  fosfomycin (MONUROL) packet 3 g (has no administration in time range)    ED Course/ Medical Decision Making/ A&P Clinical Course as of 04/19/23 0237  Thu Apr 19, 2023  0129 Urology evaluated patient at bedside for catheter placement and patient voided spontaneously [LR]    Clinical Course User Index [LR] Tamara Kenyon, Lora Paula, PA-C                                 Medical Decision Making Amount and/or Complexity of Data Reviewed Labs: ordered.   This patient is a 87 y.o. male  who presents to the ED for concern of urinary retention.   Differential diagnoses prior to evaluation: The emergent differential diagnosis includes, but is not limited to, Obstruction, stricture, bladder calculi, neurogenic bladder, trauma, fecal impaction, psychogenic, medications, UTI. This is not an exhaustive differential.   Past Medical History / Co-morbidities / Social History: hypertension, type 2 diabetes, prostate cancer s/p radiation, peripheral neuropathy, chronic kidney disease, hyperlipidemia, CHF, CAD, multiple myeloma  Physical Exam: Physical exam performed. The pertinent findings include: Hypertensive, otherwise normal vital signs.  No acute distress.  GU exam performed with attending physician, no  urethral trauma or tenderness.  Lab Tests/Imaging studies: I personally interpreted labs/imaging and the pertinent results include: CBC and CMP at baseline.  Urinalysis with moderate hemoglobin, large leukocytes, over 50 WBCs, few bacteria, and WBC clumps, urine culture pending.  Bladder scan initially with over 600 cc.  Procedure: Nursing attempted foley placement with 14 and 16 french, and attending physician Dr Posey Rea attempted placement with 22 coude catheter. All unsuccessful.  Consultations obtained: I consulted with urologist Dr Marlou Porch who will see patient and attempt catheter placement at bedside.    Disposition: After consideration of the diagnostic results and the patients response to treatment, I feel that emergency department workup does not suggest an emergent condition requiring admission or immediate intervention beyond what has been performed at this time. The plan is: Discharged to home with urology follow-up.  Patient given dose of antibiotics for UTI, and is scheduled to follow-up with urology first thing in the morning.  They plan for cystoscopy and possibly Foley placement at this time.  As patient voided spontaneously in the department, he is no longer emergently requiring a Foley. The patient is safe for discharge and has been instructed to return immediately for worsening symptoms, change in symptoms or any other concerns.  Patient and his daughter are comfortable with the plan, and all questions were answered.  Final Clinical Impression(s) / ED Diagnoses Final diagnoses:  Urinary retention  Acute cystitis with hematuria    Rx / DC Orders ED Discharge Orders     None      Portions of this report may have been transcribed using voice recognition software. Every effort was made to ensure accuracy; however, inadvertent computerized transcription errors may be present.    Jeanella Flattery 04/19/23 0237    Glendora Score, MD 04/19/23 [phone]

## 2023-04-18 NOTE — ED Provider Notes (Incomplete)
[place] [facility]GENC[facility] DEPARTMENT AT [facility] Provider Note   CSN: [id number] Arrival date & time: 04/18/23  2235     History {Add pertinent medical, surgical, social history, OB history to HPI:1} Chief Complaint  Patient presents with  . Urinary Retention    Allen Dyer is a 87 y.o. male with history of pretension, type 2 diabetes, prostate cancer s/p radiation, peripheral neuropathy, chronic kidney disease, hyperlipidemia, CHF, CAD, multiple myeloma, who presents to the emergency department complaining of urinary retention.  Patient states he has not been able to urinate for the past 8 hours or so.  He is complaining of pain in his lower abdominal region.  Daughter at bedside states that he has been struggling with urinating for several months, he does follow with [facility].  Has not needed a catheter during this time.  HPI     Home Medications Prior to Admission medications   Medication Sig Start Date End Date Taking? Authorizing Provider  allopurinol (ZYLOPRIM) 100 MG tablet Take 50 mg by mouth every morning. 10/11/20   [provider]  Ascorbic Acid (VITAMIN C) 1000 MG tablet Take 4,000 mg by mouth every morning.    [provider]  aspirin 81 MG chewable tablet Chew 1 tablet (81 mg total) by mouth daily. 09/14/21   Leroy Sea, MD  Blood Glucose Monitoring Suppl (FREESTYLE LITE) w/Device KIT 1 Device by Does not apply route daily in the afternoon. 05/24/21   Shamleffer, Konrad Dolores, MD  carvedilol (COREG) 12.5 MG tablet Take by mouth. 12/05/22   [provider]  carvedilol (COREG) 6.25 MG tablet Take 1 tablet (6.25 mg total) by mouth 2 (two) times daily with a meal. 01/16/22   Laurey Morale, MD  Cholecalciferol (VITAMIN D3 PO) Take 1 capsule by mouth every morning.    [provider]  colchicine 0.6 MG tablet Take 0.6 mg by mouth daily as needed (gout).    [provider]  cyanocobalamin 1000 MCG tablet  Take 1,000 mcg by mouth every morning.    [provider]  eplerenone (INSPRA) 50 MG tablet Take 1 tablet (50 mg total) by mouth daily. Patient taking differently: Take 50 mg by mouth every morning. 06/23/21   Laurey Morale, MD  ezetimibe (ZETIA) 10 MG tablet Take 1 tablet (10 mg total) by mouth daily. 09/14/21   Leroy Sea, MD  gabapentin (NEURONTIN) 600 MG tablet Take 300 mg by mouth 2 (two) times daily. 04/28/20   [provider]  Garlic 1000 MG CAPS Take 1,000 mg by mouth every morning.    [provider]  glipiZIDE (GLUCOTROL) 5 MG tablet Take 2 tablets (10 mg total) by mouth daily before breakfast AND 1 tablet (5 mg total) daily before supper. 03/09/23   Shamleffer, Konrad Dolores, MD  glucose blood (FREESTYLE LITE) test strip 1 each by Other route daily in the afternoon. Use as instructed 05/24/21   Shamleffer, Konrad Dolores, MD  hydrALAZINE (APRESOLINE) 25 MG tablet Take 25 mg by mouth 3 (three) times daily.    [provider]  hydrALAZINE (APRESOLINE) 50 MG tablet Take 1 tablet (50 mg total) by mouth 3 (three) times daily. 6am, 2pm and 10pm Patient taking differently: Take 25 mg by mouth 3 (three) times daily. 6am, 2pm and 10pm 09/15/21   Leroy Sea, MD  isosorbide mononitrate (IMDUR) 30 MG 24 hr tablet Take 1 tablet (30 mg total) by mouth every morning. 09/14/21   Leroy Sea,  MD  meclizine (ANTIVERT) 25 MG tablet Take 25 mg by mouth 3 (three) times daily as needed for dizziness.    [provider]  Multiple Vitamin (MULTIVITAMIN WITH MINERALS) TABS tablet Take 1 tablet by mouth every morning.    [provider]  Omega-3 Fatty Acids (FISH OIL) 1200 MG CAPS Take 1,200 mg by mouth daily.    [provider]  Ethelda Chick Calcium 500 MG TABS Take 500 mg by mouth daily.    [provider]  pantoprazole (PROTONIX) 40 MG tablet Take 1 tablet (40 mg total) by mouth daily. 09/14/21   Leroy Sea, MD   potassium chloride SA (KLOR-CON M) 20 MEQ tablet TAKE 1 TABLET(20 MEQ) BY MOUTH DAILY. NEED FOLLOW UP APPOINTMENT FOR MORE REFILLS 04/02/23   Laurey Morale, MD  tamsulosin (FLOMAX) 0.4 MG CAPS capsule Take 0.4 mg by mouth 2 (two) times daily.  03/24/17   [provider]  torsemide (DEMADEX) 20 MG tablet Take 3 tablets (60 mg total) by mouth every morning AND 2 tablets (40 mg total) every evening. 01/16/22   Laurey Morale, MD  triamcinolone cream (KENALOG) 0.1 % Apply 1 Application topically 2 (two) times daily.    [provider]      Allergies    Tramadol, Finasteride, Jardiance [empagliflozin], Lisinopril, Ciprofloxacin, Simvastatin, and Sulfa antibiotics    Review of Systems   Review of Systems  Gastrointestinal:  Positive for abdominal pain.  Genitourinary:  Positive for difficulty urinating.  All other systems reviewed and are negative.   Physical Exam Updated Vital Signs BP (!) 184/96 (BP Location: Right Arm)   Pulse 87   Temp 98.2 F (36.8 C) (Oral)   Resp 16   SpO2 96%  Physical Exam Vitals and nursing note reviewed. Exam conducted with a chaperone present.  Constitutional:      Appearance: Normal appearance.  HENT:     Head: Normocephalic and atraumatic.  Eyes:     Conjunctiva/sclera: Conjunctivae normal.  Pulmonary:     Effort: Pulmonary effort is normal. No respiratory distress.  Genitourinary:    Penis: Normal and uncircumcised.      Testes: Normal.  Skin:    General: Skin is warm and dry.  Neurological:     Mental Status: He is alert.  Psychiatric:        Mood and Affect: Mood normal.        Behavior: Behavior normal.     ED Results / Procedures / Treatments   Labs (all labs ordered are listed, but only abnormal results are displayed) Labs Reviewed  COMPREHENSIVE METABOLIC PANEL  CBC WITH DIFFERENTIAL/PLATELET  URINALYSIS, ROUTINE W REFLEX MICROSCOPIC    EKG None  Radiology No results found.  Procedures Procedures   {Document cardiac monitor, telemetry assessment procedure when appropriate:1}  Medications Ordered in ED Medications - No data to display  ED Course/ Medical Decision Making/ A&P   {   Click here for ABCD2, HEART and other calculatorsREFRESH Note before signing :1}                              Medical Decision Making Amount and/or Complexity of Data Reviewed Labs: ordered.   This patient is a 87 y.o. male  who presents to the ED for concern of ***.   Differential diagnoses prior to evaluation: The emergent differential diagnosis includes, but is not limited to,  *** . This is not an  exhaustive differential.   Past Medical History / Co-morbidities / Social History: ***  Additional history: Chart reviewed. Pertinent results include: ***  Physical Exam: Physical exam performed. The pertinent findings include: ***  Lab Tests/Imaging studies: I personally interpreted labs/imaging and the pertinent results include:  ***. ***I agree with the radiologist interpretation.  Cardiac monitoring: EKG obtained and interpreted by myself and attending physician which shows: ***   Medications: I ordered medication including ***.  I have reviewed the patients home medicines and have made adjustments as needed.   Disposition: After consideration of the diagnostic results and the patients response to treatment, I feel that *** .   ***emergency department workup does not suggest an emergent condition requiring admission or immediate intervention beyond what has been performed at this time. The plan is: ***. The patient is safe for discharge and has been instructed to return immediately for worsening symptoms, change in symptoms or any other concerns.  Final Clinical Impression(s) / ED Diagnoses Final diagnoses:  None    Rx / DC Orders ED Discharge Orders     None      Portions of this report may have been transcribed using voice recognition software. Every effort was made to ensure  accuracy; however, inadvertent computerized transcription errors may be present.

## 2023-04-19 DIAGNOSIS — R3914 Feeling of incomplete bladder emptying: Secondary | ICD-10-CM | POA: Diagnosis not present

## 2023-04-19 DIAGNOSIS — Z8546 Personal history of malignant neoplasm of prostate: Secondary | ICD-10-CM | POA: Diagnosis not present

## 2023-04-19 DIAGNOSIS — R339 Retention of urine, unspecified: Secondary | ICD-10-CM | POA: Diagnosis not present

## 2023-04-19 DIAGNOSIS — K5900 Constipation, unspecified: Secondary | ICD-10-CM | POA: Diagnosis not present

## 2023-04-19 DIAGNOSIS — Z87440 Personal history of urinary (tract) infections: Secondary | ICD-10-CM | POA: Diagnosis not present

## 2023-04-19 LAB — CBC WITH DIFFERENTIAL/PLATELET
Abs Immature Granulocytes: 0.04 10*3/uL (ref 0.00–0.07)
Basophils Absolute: 0 10*3/uL (ref 0.0–0.1)
Basophils Relative: 0 %
Eosinophils Absolute: 0.2 10*3/uL (ref 0.0–0.5)
Eosinophils Relative: 2 %
HCT: 37.6 % — ABNORMAL LOW (ref 39.0–52.0)
Hemoglobin: 12.2 g/dL — ABNORMAL LOW (ref 13.0–17.0)
Immature Granulocytes: 1 %
Lymphocytes Relative: 11 %
Lymphs Abs: 0.8 10*3/uL (ref 0.7–4.0)
MCH: 32.9 pg (ref 26.0–34.0)
MCHC: 32.4 g/dL (ref 30.0–36.0)
MCV: 101.3 fL — ABNORMAL HIGH (ref 80.0–100.0)
Monocytes Absolute: 0.7 10*3/uL (ref 0.1–1.0)
Monocytes Relative: 9 %
Neutro Abs: 6.1 10*3/uL (ref 1.7–7.7)
Neutrophils Relative %: 77 %
Platelets: 184 10*3/uL (ref 150–400)
RBC: 3.71 MIL/uL — ABNORMAL LOW (ref 4.22–5.81)
RDW: 13.4 % (ref 11.5–15.5)
WBC: 7.9 10*3/uL (ref 4.0–10.5)
nRBC: 0 % (ref 0.0–0.2)

## 2023-04-19 LAB — COMPREHENSIVE METABOLIC PANEL
ALT: 17 U/L (ref 0–44)
AST: 26 U/L (ref 15–41)
Albumin: 3.7 g/dL (ref 3.5–5.0)
Alkaline Phosphatase: 63 U/L (ref 38–126)
Anion gap: 12 (ref 5–15)
BUN: 31 mg/dL — ABNORMAL HIGH (ref 8–23)
CO2: 25 mmol/L (ref 22–32)
Calcium: 9.6 mg/dL (ref 8.9–10.3)
Chloride: 101 mmol/L (ref 98–111)
Creatinine, Ser: 2.58 mg/dL — ABNORMAL HIGH (ref 0.61–1.24)
GFR, Estimated: 24 mL/min — ABNORMAL LOW (ref >60)
Glucose, Bld: 227 mg/dL — ABNORMAL HIGH (ref 70–99)
Potassium: 3.9 mmol/L (ref 3.5–5.1)
Sodium: 138 mmol/L (ref 135–145)
Total Bilirubin: 0.6 mg/dL (ref <1.2)
Total Protein: 8.1 g/dL (ref 6.5–8.1)

## 2023-04-19 LAB — URINALYSIS, ROUTINE W REFLEX MICROSCOPIC
Bilirubin Urine: NEGATIVE
Glucose, UA: 50 mg/dL — AB
Ketones, ur: NEGATIVE mg/dL
Nitrite: NEGATIVE
Protein, ur: NEGATIVE mg/dL
Specific Gravity, Urine: 1.009 (ref 1.005–1.030)
WBC, UA: >50 WBC/hpf (ref 0–5)
pH: 5 (ref 5.0–8.0)

## 2023-04-19 MED ORDER — FOSFOMYCIN TROMETHAMINE 3 G PO PACK
3.0000 g | PACK | Freq: Once | ORAL | Status: AC
  Administered 2023-04-19: 3 g via ORAL
  Filled 2023-04-19: qty 3

## 2023-04-19 NOTE — Consult Note (Signed)
I have been asked to see the patient by Dr. Vernelle Emerald, for evaluation and management of urinary retention.  History of present illness: 87 year old male presented to the emergency department with 12 hours of inability to empty his bladder.  He presented to the emergency department and a bladder scan demonstrated greater than 600 cc of urine in his bladder.  Several unsuccessful attempts at placing a catheter led to a urology consult.  He has a history of prostate cancer and was treated with external beam radiation in 2016.  The patient states that over the last several days he has had increasingly worsening constipation.  He is really struggled to have bowel movements, but earlier this evening he had 3 small bowel movements and then again here in the hospital or in the emergency department he had 3 other bowel movements, the last 1 being quite large.  At the time of the consultation the patient's phone that he could void, and did subsequently void into his depends.  The patient has noted deteriorating voiding symptoms since about 2022 he has had to strain to void was every time he voids and is taken about 5 minutes empty his bladder.  He does take tamsulosin 0.8 mg daily.  He also has had recurrent urinary tract infections.  Review of systems: A 12 point comprehensive review of systems was obtained and is negative unless otherwise stated in the history of present illness.  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   Pain due to onychomycosis of toenails of both feet 10/18/2018   Type 2 diabetes mellitus with vascular disease (HCC) 10/18/2018   Acute on chronic combined systolic and diastolic CHF (congestive heart failure) (HCC)    Acute on chronic congestive heart failure (HCC) 07/04/2018   CHF exacerbation (HCC) 07/03/2018   AKI (acute kidney injury) (HCC) 07/03/2018   Acute CHF (congestive heart failure) (HCC) 06/18/2018   NICM (nonischemic cardiomyopathy) (HCC)    Pulmonary hypertension (HCC)    Essential hypertension 06/14/2018   Type 2 diabetes mellitus (HCC)    History of prostate cancer    Rheumatic fever    Diabetic peripheral neuropathy associated with type 2 diabetes mellitus (HCC)    Lower extremity edema    Chronic kidney disease (CKD), stage IV (severe) (HCC)    Hyperlipidemia    Chronic combined systolic (congestive) and diastolic (congestive) heart failure (HCC)     No current facility-administered medications on file prior to encounter.   Current Outpatient Medications on File Prior to Encounter  Medication Sig Dispense Refill   allopurinol (ZYLOPRIM) 100 MG tablet Take 50 mg by mouth every morning.     Ascorbic Acid (VITAMIN C) 1000 MG tablet Take 4,000 mg by mouth every morning.     aspirin 81 MG chewable tablet Chew 1 tablet (81 mg total) by mouth daily. 30 tablet 11   Blood Glucose Monitoring Suppl (FREESTYLE LITE) w/Device KIT 1 Device by Does not apply route daily in the afternoon. 1 kit 0   carvedilol (COREG) 12.5  MG tablet Take by mouth.     carvedilol (COREG) 6.25 MG tablet Take 1 tablet (6.25 mg total) by mouth 2 (two) times daily with a meal. 180 tablet 3   Cholecalciferol (VITAMIN D3 PO) Take 1 capsule by mouth every morning.     colchicine 0.6 MG tablet Take 0.6 mg by mouth daily as needed (gout).     cyanocobalamin 1000 MCG tablet Take 1,000 mcg by mouth every morning.     eplerenone (INSPRA) 50 MG tablet Take 1 tablet (50 mg total) by  mouth daily. (Patient taking differently: Take 50 mg by mouth every morning.) 90 tablet 3   ezetimibe (ZETIA) 10 MG tablet Take 1 tablet (10 mg total) by mouth daily. 30 tablet 3   gabapentin (NEURONTIN) 600 MG tablet Take 300 mg by mouth 2 (two) times daily.     Garlic 1000 MG CAPS Take 1,000 mg by mouth every morning.     glipiZIDE (GLUCOTROL) 5 MG tablet Take 2 tablets (10 mg total) by mouth daily before breakfast AND 1 tablet (5 mg total) daily before supper. 270 tablet 2   glucose blood (FREESTYLE LITE) test strip 1 each by Other route daily in the afternoon. Use as instructed 100 each 3   hydrALAZINE (APRESOLINE) 25 MG tablet Take 25 mg by mouth 3 (three) times daily.     hydrALAZINE (APRESOLINE) 50 MG tablet Take 1 tablet (50 mg total) by mouth 3 (three) times daily. 6am, 2pm and 10pm (Patient taking differently: Take 25 mg by mouth 3 (three) times daily. 6am, 2pm and 10pm)     isosorbide mononitrate (IMDUR) 30 MG 24 hr tablet Take 1 tablet (30 mg total) by mouth every morning.     meclizine (ANTIVERT) 25 MG tablet Take 25 mg by mouth 3 (three) times daily as needed for dizziness.     Multiple Vitamin (MULTIVITAMIN WITH MINERALS) TABS tablet Take 1 tablet by mouth every morning.     Omega-3 Fatty Acids (FISH OIL) 1200 MG CAPS Take 1,200 mg by mouth daily.     Oyster Shell Calcium 500 MG TABS Take 500 mg by mouth daily.     pantoprazole (PROTONIX) 40 MG tablet Take 1 tablet (40 mg total) by mouth daily. 30 tablet 2   potassium chloride SA (KLOR-CON M) 20 MEQ tablet TAKE 1 TABLET(20 MEQ) BY MOUTH DAILY. NEED FOLLOW UP APPOINTMENT FOR MORE REFILLS 30 tablet 0   tamsulosin (FLOMAX) 0.4 MG CAPS capsule Take 0.4 mg by mouth 2 (two) times daily.      torsemide (DEMADEX) 20 MG tablet Take 3 tablets (60 mg total) by mouth every morning AND 2 tablets (40 mg total) every evening. 180 tablet 5   triamcinolone cream (KENALOG) 0.1 % Apply 1 Application topically 2 (two) times daily.      Past Medical  History:  Diagnosis Date   Chronic combined systolic and diastolic CHF (congestive heart failure) (HCC)    CKD (chronic kidney disease), stage III (HCC)    Hyperlipidemia    Hypertension    Lower extremity edema    Mild CAD    a. mild-mod by cath 05/2018.   Multiple myeloma (HCC) 11/15/2018   NICM (nonischemic cardiomyopathy) (HCC)    Peripheral neuropathy    Prostate cancer (HCC)    Status post XRT   Rheumatic fever    Trifascicular block    Type 2 diabetes mellitus (HCC)     Past Surgical History:  Procedure Laterality Date   RIGHT HEART  CATH N/A 07/10/2018   Procedure: RIGHT HEART CATH;  Surgeon: Laurey Morale, MD;  [facility] INVASIVE CV LAB;  Service: Cardiovascular;  Laterality: N/A;   RIGHT/LEFT HEART CATH AND CORONARY ANGIOGRAPHY N/A 06/18/2018   Procedure: RIGHT/LEFT HEART CATH AND CORONARY ANGIOGRAPHY;  Surgeon: Lennette Bihari, MD;  Location: [facility] INVASIVE CV LAB;  Service: Cardiovascular;  Laterality: N/A;    Social History   Tobacco Use   Smoking status: Former    Current packs/day: 0.00    Average packs/day: 2.0 packs/day for 10.0 years (20.0 ttl pk-yrs)    Types: Cigarettes    Start date: 1963    Quit date: 51    Years since quitting: 52.0   Smokeless tobacco: Never  Vaping Use   Vaping status: Never Used  Substance Use Topics   Alcohol use: Not Currently    Comment: quit in 1975, no h/o heavy use   Drug use: Never    Family History  Problem Relation Age of Onset   Cancer Mother    Congestive Heart Failure Father        died from heart failure at the age of 49   Cancer Sister    Congestive Heart Failure Brother        died from heart failure at the age of 50   Stroke Neg Hx     PE: Vitals:   04/18/23 2244  BP: (!) 184/96  Pulse: 87  Resp: 16  Temp: 98.2 F (36.8 C)  TempSrc: Oral  SpO2: 96%   Patient appears to be in no acute distress  patient is alert and oriented x3 Atraumatic normocephalic head No cervical or supraclavicular  lymphadenopathy appreciated No increased work of breathing, no audible wheezes/rhonchi Regular sinus rhythm/rate Abdomen is soft, nontender, nondistended, no CVA or suprapubic tenderness Lower extremities are symmetric without appreciable edema Grossly neurologically intact No identifiable skin lesions  No results for input(s): "WBC", "HGB", "HCT" in the last 72 hours. No results for input(s): "NA", "K", "CL", "CO2", "GLUCOSE", "BUN", "CREATININE", "CALCIUM" in the last 72 hours. No results for input(s): "LABPT", "INR" in the last 72 hours. No results for input(s): "LABURIN" in the last 72 hours. Results for orders placed or performed during the hospital encounter of 05/27/19  SARS CORONAVIRUS 2 (TAT 6-24 HRS) Nasopharyngeal Nasopharyngeal Swab     Status: None   Collection Time: 05/27/19  2:51 PM   Specimen: Nasopharyngeal Swab  Result Value Ref Range Status   SARS Coronavirus 2 NEGATIVE NEGATIVE Final    Comment: (NOTE) SARS-CoV-2 target nucleic acids are NOT DETECTED. The SARS-CoV-2 RNA is generally detectable in upper and lower respiratory specimens during the acute phase of infection. Negative results do not preclude SARS-CoV-2 infection, do not rule out co-infections with other pathogens, and should not be used as the sole basis for treatment or other patient management decisions. Negative results must be combined with clinical observations, patient history, and epidemiological information. The expected result is Negative. Fact Sheet for Patients: HairSlick.no Fact Sheet for Healthcare Providers: quierodirigir.com This test is not yet approved or cleared by the [place] FDA and  has been authorized for detection and/or diagnosis of SARS-CoV-2 by FDA under an Emergency Use Authorization (EUA). This EUA will remain  in effect (meaning this test can be used) for the duration of the COVID-19 declaration under Section 56  4(b)(1) of the Act, 21 U.S.C. section 360bbb-3(b)(1), unless the authorization is terminated or revoked sooner. Performed at [facility], [address] [place], [place] [address]     Imaging: none  Imp: Acute urinary retention precipitated by constipation.  Likely baseline urethral stricture or BPH with weakened bladder.  Recommendations: The patient was fortunately able to void in the ER without receiving catheter.  I recommended that he follow-up with me early in the morning so we can perform cystoscopy and assess his problem, and place a Foley catheter if necessary.    Crist Fat

## 2023-04-19 NOTE — Discharge Instructions (Addendum)
You were seen in the emergency department for urinary retention.  You were able to void after attempts to place the catheter.  The urologist would like to see you first thing in the morning.  Your urine sample did show that you have a urinary tract infection, so we gave you some antibiotics.  Please follow-up with urologist as scheduled.  Return to the ER for new or worsening symptoms.

## 2023-04-20 LAB — URINE CULTURE: Culture: >=100000 — AB

## 2023-04-21 ENCOUNTER — Telehealth (HOSPITAL_BASED_OUTPATIENT_CLINIC_OR_DEPARTMENT_OTHER): Payer: Self-pay | Admitting: *Deleted

## 2023-04-21 NOTE — Telephone Encounter (Signed)
Post ED Visit - Positive Culture Follow-up  Culture report reviewed by antimicrobial stewardship pharmacist: [facility] []  Enzo Bi, Pharm.D. []  Celedonio Miyamoto, Pharm.D., BCPS AQ-ID []  Garvin Fila, Pharm.D., BCPS []  Georgina Pillion, [address].D., BCPS []  [place], [address].D., BCPS, AAHIVP []  Estella Husk, Pharm.D., BCPS, AAHIVP []  Lysle Pearl, PharmD, BCPS []  Phillips Climes, PharmD, BCPS []  Agapito Games, PharmD, BCPS []  Verlan Friends, PharmD []  Mervyn Gay, PharmD, BCPS [x]  Angelia Mould, PharmD  [facility] []  Len Childs, PharmD []  Greer Pickerel, PharmD []  Adalberto Cole, PharmD []  Perlie Gold, Rph []  Lonell Face) Jean Rosenthal, PharmD []  Earl Many, PharmD []  Junita Push, PharmD []  Dorna Leitz, PharmD []  Terrilee Files, PharmD []  Lynann Beaver, PharmD []  Keturah Barre, PharmD []  Loralee Pacas, PharmD []  Bernadene Person, PharmD   Positive urine culture No symptoms reported and no further patient follow-up is required at this time per [facility]  Patsey Berthold 04/21/2023, 12:24 PM

## 2023-04-26 ENCOUNTER — Ambulatory Visit: Payer: Self-pay

## 2023-04-26 NOTE — Patient Outreach (Signed)
  Care Coordination   04/26/2023 Name: Bria Portales MRN: [id number] DOB: 02/19/1936   Care Coordination Outreach Attempts:  An unsuccessful outreach was attempted for an appointment today.  Follow Up Plan:  Additional outreach attempts will be made to offer the patient complex care management information and services.   Encounter Outcome:  No Answer   Care Coordination Interventions:  No, not indicated    Tristyn Pharris J Desera Graffeo, RN, MSN RN Care Manager [facility], Population Health Direct Dial: [phone]  Fax: [phone] Website: [email or web address]

## 2023-05-03 ENCOUNTER — Telehealth: Payer: Self-pay

## 2023-05-03 NOTE — Patient Outreach (Signed)
  Care Coordination   Follow Up Visit Note   05/03/2023 Name: Allen Dyer MRN: [id number] DOB: Feb 09, 1936  Allen Dyer is a 88 y.o. year old male who sees Koirala, Dibas, MD for primary care. I spoke with  Rye Hjort by phone today.  What matters to the patients health and wellness today?  Maintain health    Goals Addressed             This Visit's Progress    Health Maintenance       Patient Goals/Self Care Activities: -Patient/Caregiver will self-administer medications as prescribed as evidenced by self-report/primary caregiver report  -Patient/Caregiver will attend all scheduled provider appointments as evidenced by clinician review of documented attendance to scheduled appointments and patient/caregiver report  -Calls provider office for new concerns, questions, or BP outside discussed parameters -Checks BP and records as discussed -Follows a low sodium diet/DASH diet -check blood sugar at prescribed times -record values and write them down take them to all doctor visits  -check feet daily for cuts, sores or redness -trim toenails straight across -wash and dry feet carefully every day -wear comfortable, cotton socks -wear comfortable, well-fitting shoes -Adhere to low sodium diet   Patient reports doing good. Recent fall outside, no injuries. Discussed fall precautions.    Blood sugars 100-120. Wt 216 lbs.  Reiterated Diabetes and HF management.  No concerns.          SDOH assessments and interventions completed:  Yes     Care Coordination Interventions:  Yes, provided   Follow up plan: Follow up call scheduled for April    Encounter Outcome:  Patient Visit Completed   Rilie Glanz J Yoceline Bazar, RN, MSN RN Care Manager [facility], Population Health Direct Dial: [phone]  Fax: [phone] Website: [email or web address]

## 2023-05-03 NOTE — Patient Instructions (Signed)
 Visit Information  Thank you for taking time to visit with me today. Please don't hesitate to contact me if I can be of assistance to you.   Following are the goals we discussed today:   Goals Addressed             This Visit's Progress    Health Maintenance       Patient Goals/Self Care Activities: -Patient/Caregiver will self-administer medications as prescribed as evidenced by self-report/primary caregiver report  -Patient/Caregiver will attend all scheduled provider appointments as evidenced by clinician review of documented attendance to scheduled appointments and patient/caregiver report  -Calls provider office for new concerns, questions, or BP outside discussed parameters -Checks BP and records as discussed -Follows a low sodium diet/DASH diet -check blood sugar at prescribed times -record values and write them down take them to all doctor visits  -check feet daily for cuts, sores or redness -trim toenails straight across -wash and dry feet carefully every day -wear comfortable, cotton socks -wear comfortable, well-fitting shoes -Adhere to low sodium diet   Patient reports doing good. Recent fall outside, no injuries. Discussed fall precautions.    Blood sugars 100-120. Wt 216 lbs.  Reiterated Diabetes and HF management.  No concerns.          Our next appointment is by telephone on 07/26/23 at 1030 am  Please call the care guide team at [phone] if you need to cancel or reschedule your appointment.   If you are experiencing a Mental Health or Behavioral Health Crisis or need someone to talk to, please call the Suicide and Crisis Lifeline: 988   Patient verbalizes understanding of instructions and care plan provided today and agrees to view in MyChart. Active MyChart status and patient understanding of how to access instructions and care plan via MyChart confirmed with patient.     The patient has been provided with contact information for the care management team  and has been advised to call with any health related questions or concerns.   Kennadi Albany J Jamani Eley, RN, MSN RN Care Manager [facility], Population Health Direct Dial: [phone]  Fax: [phone] Website: [email or web address]

## 2023-05-04 ENCOUNTER — Other Ambulatory Visit (HOSPITAL_COMMUNITY): Payer: Self-pay | Admitting: Cardiology

## 2023-05-07 NOTE — Telephone Encounter (Signed)
 This is a Heart and Vascular Pt

## 2023-05-21 DIAGNOSIS — Z8546 Personal history of malignant neoplasm of prostate: Secondary | ICD-10-CM | POA: Diagnosis not present

## 2023-05-28 DIAGNOSIS — R351 Nocturia: Secondary | ICD-10-CM | POA: Diagnosis not present

## 2023-05-28 DIAGNOSIS — R3912 Poor urinary stream: Secondary | ICD-10-CM | POA: Diagnosis not present

## 2023-05-28 DIAGNOSIS — N401 Enlarged prostate with lower urinary tract symptoms: Secondary | ICD-10-CM | POA: Diagnosis not present

## 2023-05-28 DIAGNOSIS — Z8546 Personal history of malignant neoplasm of prostate: Secondary | ICD-10-CM | POA: Diagnosis not present

## 2023-06-01 ENCOUNTER — Ambulatory Visit (INDEPENDENT_AMBULATORY_CARE_PROVIDER_SITE_OTHER): Payer: Medicare Other | Admitting: Podiatry

## 2023-06-01 ENCOUNTER — Encounter: Payer: Self-pay | Admitting: Podiatry

## 2023-06-01 DIAGNOSIS — E1159 Type 2 diabetes mellitus with other circulatory complications: Secondary | ICD-10-CM

## 2023-06-01 DIAGNOSIS — M79674 Pain in right toe(s): Secondary | ICD-10-CM

## 2023-06-01 DIAGNOSIS — M79675 Pain in left toe(s): Secondary | ICD-10-CM | POA: Diagnosis not present

## 2023-06-01 DIAGNOSIS — B351 Tinea unguium: Secondary | ICD-10-CM

## 2023-06-01 NOTE — Progress Notes (Signed)
 This patient returns to my office for at risk foot care.  This patient requires this care by a professional since this patient will be at risk due to having  Diabetes and chronic kidney disease.   This patient is unable to cut nails himself  since the patient cannot reach his  nails.These nails are painful walking and wearing shoes.   This patient presents for at risk foot care today.    General Appearance  Alert, conversant and in no acute stress.  Vascular  Dorsalis pedis and posterior tibial  pulses are palpable  bilaterally.  Capillary return is within normal limits  bilaterally. Temperature is within normal limits  bilaterally.  Neurologic  Senn-Weinstein monofilament wire test diminished   bilaterally. Muscle power within normal limits bilaterally.  Nails Thick disfigured discolored nails with subungual debris  from hallux to fifth toes bilaterally. No evidence of bacterial infection or drainage bilaterally.  Orthopedic  No limitations of motion  feet .  No crepitus or effusions noted.  No bony pathology or digital deformities noted.  Arthritis right foot/ankle. Capsulitis 5th met right foot.  Skin  normotropic skin with no porokeratosis noted bilaterally.  No signs of infections or ulcers noted.   Symptomatic porokeratosis sub 5th  right   Onychomycosis  Pain in right toes  Pain in left toes  Porokeratosis  right  Consent was obtained for treatment procedures.   Mechanical debridement of nails 1-5  bilaterally performed with a nail nipper.  Filed with dremel without incident. No infection or ulcer.  Debride callus  right foot with # 15 blade and dremel.   Return office visit   3 months       Told patient to return for periodic foot care and evaluation due to potential at risk complications.   Cordella Bold DPM

## 2023-06-04 ENCOUNTER — Other Ambulatory Visit (HOSPITAL_COMMUNITY): Payer: Self-pay | Admitting: Cardiology

## 2023-06-12 DIAGNOSIS — R3912 Poor urinary stream: Secondary | ICD-10-CM | POA: Diagnosis not present

## 2023-06-12 DIAGNOSIS — N401 Enlarged prostate with lower urinary tract symptoms: Secondary | ICD-10-CM | POA: Diagnosis not present

## 2023-06-12 DIAGNOSIS — R350 Frequency of micturition: Secondary | ICD-10-CM | POA: Diagnosis not present

## 2023-06-20 DIAGNOSIS — R3914 Feeling of incomplete bladder emptying: Secondary | ICD-10-CM | POA: Diagnosis not present

## 2023-06-20 DIAGNOSIS — N401 Enlarged prostate with lower urinary tract symptoms: Secondary | ICD-10-CM | POA: Diagnosis not present

## 2023-07-04 DIAGNOSIS — N35013 Post-traumatic anterior urethral stricture: Secondary | ICD-10-CM | POA: Diagnosis not present

## 2023-07-04 DIAGNOSIS — N401 Enlarged prostate with lower urinary tract symptoms: Secondary | ICD-10-CM | POA: Diagnosis not present

## 2023-07-04 DIAGNOSIS — R3914 Feeling of incomplete bladder emptying: Secondary | ICD-10-CM | POA: Diagnosis not present

## 2023-07-04 DIAGNOSIS — R3912 Poor urinary stream: Secondary | ICD-10-CM | POA: Diagnosis not present

## 2023-07-07 ENCOUNTER — Other Ambulatory Visit (HOSPITAL_COMMUNITY): Payer: Self-pay | Admitting: Cardiology

## 2023-07-10 ENCOUNTER — Telehealth (HOSPITAL_COMMUNITY): Payer: Self-pay | Admitting: Cardiology

## 2023-07-10 NOTE — Telephone Encounter (Signed)
 Front office called patient on mobile number to schedule an overdue follow up appointment with Dr. Shirlee Latch. Patient did not answer telephone call so front office left voicemail on telephone.

## 2023-07-11 ENCOUNTER — Telehealth (HOSPITAL_COMMUNITY): Payer: Self-pay

## 2023-07-11 ENCOUNTER — Telehealth (HOSPITAL_COMMUNITY): Payer: Self-pay | Admitting: Cardiology

## 2023-07-11 DIAGNOSIS — N184 Chronic kidney disease, stage 4 (severe): Secondary | ICD-10-CM | POA: Diagnosis not present

## 2023-07-11 DIAGNOSIS — M542 Cervicalgia: Secondary | ICD-10-CM | POA: Diagnosis not present

## 2023-07-11 DIAGNOSIS — M25562 Pain in left knee: Secondary | ICD-10-CM | POA: Diagnosis not present

## 2023-07-11 DIAGNOSIS — I5022 Chronic systolic (congestive) heart failure: Secondary | ICD-10-CM | POA: Diagnosis not present

## 2023-07-11 DIAGNOSIS — E7800 Pure hypercholesterolemia, unspecified: Secondary | ICD-10-CM | POA: Diagnosis not present

## 2023-07-11 DIAGNOSIS — E1122 Type 2 diabetes mellitus with diabetic chronic kidney disease: Secondary | ICD-10-CM | POA: Diagnosis not present

## 2023-07-11 NOTE — Telephone Encounter (Signed)
 Called to confirm/remind patient of their appointment at the [facility] on 07/12/2023 2:30.   Appointment:   [] Confirmed  [x] Left mess   [] No answer/No voice mail  [] Phone not in service  Patient reminded to bring all medications and/or complete list.  Confirmed patient has transportation. Gave directions, instructed to utilize valet parking.

## 2023-07-12 ENCOUNTER — Ambulatory Visit (HOSPITAL_COMMUNITY)
Admission: RE | Admit: 2023-07-12 | Discharge: 2023-07-12 | Disposition: A | Discharge status: Home / Self Care | Source: Ambulatory Visit | Attending: Physician Assistant | Admitting: Physician Assistant

## 2023-07-12 ENCOUNTER — Encounter (HOSPITAL_COMMUNITY): Payer: Self-pay

## 2023-07-12 VITALS — BP 135/81 | HR 60 | Ht 67.5 in | Wt 219.0 lb

## 2023-07-12 DIAGNOSIS — Z7984 Long term (current) use of oral hypoglycemic drugs: Secondary | ICD-10-CM | POA: Insufficient documentation

## 2023-07-12 DIAGNOSIS — Z79899 Other long term (current) drug therapy: Secondary | ICD-10-CM | POA: Diagnosis not present

## 2023-07-12 DIAGNOSIS — G4733 Obstructive sleep apnea (adult) (pediatric): Secondary | ICD-10-CM | POA: Diagnosis not present

## 2023-07-12 DIAGNOSIS — I4510 Unspecified right bundle-branch block: Secondary | ICD-10-CM | POA: Diagnosis not present

## 2023-07-12 DIAGNOSIS — I5022 Chronic systolic (congestive) heart failure: Secondary | ICD-10-CM | POA: Insufficient documentation

## 2023-07-12 DIAGNOSIS — I779 Disorder of arteries and arterioles, unspecified: Secondary | ICD-10-CM | POA: Insufficient documentation

## 2023-07-12 DIAGNOSIS — Z7982 Long term (current) use of aspirin: Secondary | ICD-10-CM | POA: Diagnosis not present

## 2023-07-12 DIAGNOSIS — N184 Chronic kidney disease, stage 4 (severe): Secondary | ICD-10-CM | POA: Diagnosis not present

## 2023-07-12 DIAGNOSIS — E1122 Type 2 diabetes mellitus with diabetic chronic kidney disease: Secondary | ICD-10-CM | POA: Diagnosis not present

## 2023-07-12 DIAGNOSIS — Z8546 Personal history of malignant neoplasm of prostate: Secondary | ICD-10-CM | POA: Diagnosis not present

## 2023-07-12 DIAGNOSIS — I272 Pulmonary hypertension, unspecified: Secondary | ICD-10-CM | POA: Insufficient documentation

## 2023-07-12 DIAGNOSIS — R42 Dizziness and giddiness: Secondary | ICD-10-CM | POA: Diagnosis not present

## 2023-07-12 DIAGNOSIS — C9000 Multiple myeloma not having achieved remission: Secondary | ICD-10-CM | POA: Diagnosis not present

## 2023-07-12 DIAGNOSIS — I2510 Atherosclerotic heart disease of native coronary artery without angina pectoris: Secondary | ICD-10-CM | POA: Insufficient documentation

## 2023-07-12 DIAGNOSIS — I428 Other cardiomyopathies: Secondary | ICD-10-CM | POA: Insufficient documentation

## 2023-07-12 DIAGNOSIS — Z8673 Personal history of transient ischemic attack (TIA), and cerebral infarction without residual deficits: Secondary | ICD-10-CM | POA: Diagnosis not present

## 2023-07-12 DIAGNOSIS — I130 Hypertensive heart and chronic kidney disease with heart failure and stage 1 through stage 4 chronic kidney disease, or unspecified chronic kidney disease: Secondary | ICD-10-CM | POA: Insufficient documentation

## 2023-07-12 MED ORDER — POTASSIUM CHLORIDE CRYS ER 20 MEQ PO TBCR: 20.0000 meq | EXTENDED_RELEASE_TABLET | Freq: Every day | ORAL | 3 refills | Status: DC

## 2023-07-12 MED ORDER — TORSEMIDE 20 MG PO TABS
60.0000 mg | ORAL_TABLET | Freq: Two times a day (BID) | ORAL | 5 refills | Status: DC
Start: 2023-07-12 — End: 2023-09-27

## 2023-07-12 NOTE — Progress Notes (Addendum)
 Date:  07/12/2023   ID:  [facility], DOB 25-Jan-1936, MRN [id number]    Provider location: [place] Type of Visit: Established patient   PCP:  Allen Bussing, MD  Cardiologist:  Allen Magic, MD Nephrology: Dr. Almon [facility] HF Cardiology: Dr [place] [facility]   HPI: [facility] is a 88 y.o. male with a history of DM, rheumatic fever, HTN, hyperlipidemia, CKD IV, prostate CA s/p XRT, systolic HF due to NICM (EF 30-35%) 05/2018, pulmonary venous HTN, and mild nonobstructive CAD.  He served for > 20 years in the [employer] including in [place].    Admitted 3/11-3/18/20 with volume overload. Diuresed with IV lasix. HF team consulted with concern for pulmonary HTN due to ongoing oxygen need. RHC completed and showed moderate pulmonary venous HTN with normal PVR 1.7 (no PAH).   Patient was diagnosed with smoldering myeloma in 7/20, no treatment planned at this time.   Cardiolite in 2/21 showed EF 36%, fixed anteroapical defect, no ischemia.  Echo in 11/21 showed EF 30-35%, diffuse hypokinesis, normal RV, PASP 45 mmHg, dilated IVC.   Seen in ED 08/05/21 with blurred vision. Felt to be lightheadedness vs vertigo. Orthostatics negative. He had PCP follow up and was diagnosed with vertigo.  CVA in 5/23, echo showed EF 35-40%, mild LVH, normal RV.  Carotid dopplers showed minimal disease.  MRA head showed severe vertebral artery disease bilaterally.   Here today for overdue follow-up. Walks 1.5 miles outdoors and climbs the stairs to go to take a shower every day without limitation. Notes some shortness of breath when straining to urinate, struggles with urinary retention. No orthopnea or PND. Has abdominal distension and lower extremity edema. Elevates his feet and wears compression stockings. Diligently limits sodium intake. Intermittent dizziness since his stroke in 2023. No longer drives. Living with his daughter. Takes his medications as prescribed.   Past Surgical History:   Procedure Laterality Date   RIGHT HEART CATH N/A 07/10/2018   Procedure: RIGHT HEART CATH;  Surgeon: [place] Morale, MD;  Location: [facility] [facility];  Service: Cardiovascular;  Laterality: N/A;   RIGHT/LEFT HEART CATH AND CORONARY ANGIOGRAPHY N/A 06/18/2018   Procedure: RIGHT/LEFT HEART CATH AND CORONARY ANGIOGRAPHY;  Surgeon: Allen Bihari, MD;  Location: [facility];  Service: Cardiovascular;  Laterality: N/A;    Current Outpatient Medications  Medication Sig Dispense Refill   allopurinol (ZYLOPRIM) 100 MG tablet Take 50 mg by mouth every morning.     Ascorbic Acid (VITAMIN C) 1000 MG tablet Take 4,000 mg by mouth every morning.     aspirin 81 MG chewable tablet Chew 1 tablet (81 mg total) by mouth daily. 30 tablet 11   Blood Glucose Monitoring Suppl (FREESTYLE LITE) w/Device KIT 1 Device by Does not apply route daily in the afternoon. 1 kit 0   carvedilol (COREG) 12.5 MG tablet Take by mouth.     carvedilol (COREG) 6.25 MG tablet Take 1 tablet (6.25 mg total) by mouth 2 (two) times daily with a meal. 180 tablet 3   Cholecalciferol (VITAMIN D3 PO) Take 1 capsule by mouth every morning.     colchicine 0.6 MG tablet Take 0.6 mg by mouth daily as needed (gout).     cyanocobalamin 1000 MCG tablet Take 1,000 mcg by mouth every morning.     eplerenone (INSPRA) 50 MG tablet Take 1 tablet (50 mg total) by mouth daily. (Patient taking differently: Take 50 mg by mouth every morning.) 90  tablet 3   ezetimibe (ZETIA) 10 MG tablet Take 1 tablet (10 mg total) by mouth daily. 30 tablet 3   gabapentin (NEURONTIN) 600 MG tablet Take 300 mg by mouth 2 (two) times daily.     Garlic 1000 MG CAPS Take 1,000 mg by mouth every morning.     glipiZIDE (GLUCOTROL) 5 MG tablet Take 2 tablets (10 mg total) by mouth daily before breakfast AND 1 tablet (5 mg total) daily before supper. 270 tablet 2   glucose blood (FREESTYLE LITE) test strip 1 each by Other route daily in the afternoon. Use as instructed 100  each 3   hydrALAZINE (APRESOLINE) 50 MG tablet Take 1 tablet (50 mg total) by mouth 3 (three) times daily. 6am, 2pm and 10pm (Patient taking differently: Take 25 mg by mouth 3 (three) times daily. 6am, 2pm and 10pm)     isosorbide mononitrate (IMDUR) 30 MG 24 hr tablet Take 1 tablet (30 mg total) by mouth every morning.     meclizine (ANTIVERT) 25 MG tablet Take 25 mg by mouth 3 (three) times daily as needed for dizziness.     Multiple Vitamin (MULTIVITAMIN WITH MINERALS) TABS tablet Take 1 tablet by mouth every morning.     Omega-3 Fatty Acids (FISH OIL) 1200 MG CAPS Take 1,200 mg by mouth daily.     Oyster Shell Calcium 500 MG TABS Take 500 mg by mouth daily.     pantoprazole (PROTONIX) 40 MG tablet Take 1 tablet (40 mg total) by mouth daily. 30 tablet 2   tamsulosin (FLOMAX) 0.4 MG CAPS capsule Take 0.4 mg by mouth 2 (two) times daily.      triamcinolone cream (KENALOG) 0.1 % Apply 1 Application topically 2 (two) times daily.     potassium chloride SA (KLOR-CON M) 20 MEQ tablet Take 1 tablet (20 mEq total) by mouth daily. 90 tablet 3   torsemide (DEMADEX) 20 MG tablet Take 3 tablets (60 mg total) by mouth 2 (two) times daily. 180 tablet 5   No current facility-administered medications for this encounter.    Allergies:   Tramadol, Finasteride, Jardiance [empagliflozin], Lisinopril, Ciprofloxacin, Simvastatin, and Sulfa antibiotics   Social History:  The patient  reports that he quit smoking about 52 years ago. His smoking use included cigarettes. He started smoking about 62 years ago. He has a 20 pack-year smoking history. He has never used smokeless tobacco. He reports that he does not currently use alcohol. He reports that he does not use drugs.   Family History:  The patient's family history includes Cancer in his mother and sister; Congestive Heart [place] in his brother and father.   ROS:  Please see the history of present illness.   All other systems are personally reviewed and  negative.   Wt Readings from Last 3 Encounters:  07/12/23 99.3 kg (219 lb)  03/09/23 98.9 kg (218 lb)  01/02/23 100.9 kg (222 lb 6.4 oz)   BP 135/81   Pulse 60   Ht 5' 7.5" (1.715 m)   Wt 99.3 kg (219 lb)   SpO2 95%   BMI 33.79 kg/m   Physical Exam:   General:  Well appearing elderly male Neck: JVP 8-9 Cor: Regular rate & rhythm. No rubs, gallops or murmurs. Lungs: clear Abdomen: soft, nontender, + distended.  Extremities: no cyanosis, clubbing, rash, 1+ edema Neuro: alert & orientedx3. Affect pleasant   Recent Labs: 04/19/2023: ALT 17; BUN 31; Creatinine, Ser 2.58; Hemoglobin 12.2; Platelets 184; Potassium 3.9; Sodium 138  Personally reviewed   ECG today: SR with 1st degree AVB, bifascicular block, 66 bpm, PVC   ASSESSMENT AND PLAN: 1. Chronic systolic HF:  New diagnosis 05/2018, EF 30-35% by echo. Due to NICM. LHC 05/2018 with mild non-osbtructive CAD (50% proximal LAD stenosis). Has had high BP for a long time, but it has generally been controlled recently. Had palpitations in past, but very few PVCs on telemetry when in the hospital. Prior hx of ETOH abuse, but none in 40+ years. Did not receive chemotherapy with prior prostate cancer. QRS is wide on EKG, but it is RBBB.  He has multiple myeloma but he does not have ventricular wall thickening that would be suggestive of cardiac amyloidosis.  RHC 3/20 with mildly elevated filling pressures, CO preserved, pulmonary venous HTN. No PAH.  Echo in 7/20 showed that EF remains low at 25-30% with moderate LV dilation, no LVH, and normal RV.  Echo in 11/21 showed EF 30-35%, diffuse hypokinesis, normal RV, PASP 45 mmHg, dilated IVC. Echo (3/23) EF 30-35%, echo in 5/23 showed EF 35-40%, mild LVH, normal RV.   - NYHA II. Appears volume up. Increase Torsemide to 60 BID.  - Continue carvedilol 6.25 mg bid.   - No ACE/ARB/ARNI with CKD stage 3.  - Off SGLT2i with yeast infection. - Continue hydralazine 50 mg tid and Imdur 30 mg daily.  -  Continue eplerenone 50 mg daily (breast tenderness with spiro).  - BMET/BNP in 1 week - With advanced age and nonischemic etiology of cardiomyopathy, would avoid ICD.  He has RBBB so would not likely benefit from CRT.   2. CKD Stage IV: Last creatinine 2.6. Repeat labs scheduled. - Follows with Nephrology - No SGLT2i with urinary retention/yeast infections  3. CAD: 50% pLAD on LHC in 2/20. Cardiolite in 2/21 showed no ischemia.   - Continue Zetia. Does not tolerate statins - Continue ASA 81.    4. Severe OSA: Continue CPAP.   5. Pulmonary hypertension: He had pulmonary venous hypertension based on prior RHC.   6. Smoldering myeloma:  No plans for chemotherapy treatment. Sees Dr Bertis Ruddy.   7. CVA: In 5/23.  He has severe vertebral artery disease.   - Continue ASA 81. Plavix stopped by Neuro in 04/24. - Has not tolerated statins. Continue Zetia.  Follow up 8 weeks to assess volume    Helder Crisafulli N, PA-C  07/12/2023

## 2023-07-12 NOTE — Patient Instructions (Signed)
 Medication Changes:  INCREASE TORSEMIDE TO 60MG  TWICE DAILY   Lab Work:  PLEASE RETURN FOR LABS AS SCHEDULED IN 1 WEEK   Follow-Up in: 8 WEEKS AS SCHEDULED   At the [facility], you and your health needs are our priority. We have a designated team specialized in the treatment of Heart Failure. This Care Team includes your primary Heart Failure Specialized Cardiologist (physician), Advanced Practice Providers (APPs- Physician Assistants and Nurse Practitioners), and Pharmacist who all work together to provide you with the care you need, when you need it.   You may see any of the following providers on your designated Care Team at your next follow up:  Dr. Arvilla Meres Dr. Marca Ancona Dr. Dorthula Nettles Dr. Theresia Bough Tonye Becket, NP Robbie Lis, [place] [facility] [place], [place] Brynda Peon, NP [place] Lee, NP Karle Plumber, PharmD   Please be sure to bring in all your medications bottles to every appointment.   Need to Contact us:  If you have any questions or concerns before your next appointment please send [place] a message through [place] or call our office at [phone].    TO LEAVE A MESSAGE FOR THE NURSE SELECT OPTION 2, PLEASE LEAVE A MESSAGE INCLUDING: YOUR NAME DATE OF BIRTH CALL BACK NUMBER REASON FOR CALL**this is important as we prioritize the call backs  YOU WILL RECEIVE A CALL BACK THE SAME DAY AS LONG AS YOU CALL BEFORE 4:00 PM

## 2023-07-17 ENCOUNTER — Other Ambulatory Visit: Payer: Self-pay | Admitting: Urology

## 2023-07-17 ENCOUNTER — Telehealth: Payer: Self-pay | Admitting: Cardiology

## 2023-07-17 NOTE — Telephone Encounter (Signed)
 Hi Lindsay, patient's chart was reviewed for preoperative cardiac evaluation.  He was seen by you on 07/12/23 at which time it sounds like he was very active. Can you please comment on cardiac risk for upcoming procedure (cystoscopy with urethral dilation) since office visit was less than 2 months ago.    Please route your response to p cv div preop.  Thank you, Levi Aland, NP-C 07/17/2023, 4:05 PM

## 2023-07-17 NOTE — Telephone Encounter (Signed)
 He should be seen for follow-up in clinic to assess volume before any invasive procedure with general anesthesia. He was volume overloaded and we increased his diuretic.   He will be at moderately increased risk in view of his age, cardiac history and severe OSA.   Triage team, please arrange follow-up visit

## 2023-07-17 NOTE — Telephone Encounter (Signed)
   Pre-operative Risk Assessment    Patient Name: [place]  DOB: [birth year] MRN: [id number]   Date of last office visit: 05/08/22 Date of next office visit: n/a   Request for Surgical Clearance    Procedure:  optume balloon dilation retrograde urethrogram  Date of Surgery:  Clearance 08/03/23                                Surgeon:  Dr. Jettie Pagan Surgeon's Group or Practice Name:  [facility] Phone number:   [phone] Fax number:  [phone]   Type of Clearance Requested:   - Medical  - Pharmacy:  Hold Aspirin 5 days   Type of Anesthesia:  General    Additional requests/questions:      Melissa Noon   07/17/2023, 3:42 PM

## 2023-07-18 NOTE — Telephone Encounter (Signed)
 Primary Cardiologist:Traci Turner, MD  Chart reviewed as part of pre-operative protocol coverage. Because of [place] past medical history and time since last visit, he/she will require a follow-up visit in order to better assess preoperative cardiovascular risk.  Pre-op covering staff: - Patient is required to follow-up with [facility] prior to receiving cardiac clearance - Please contact requesting surgeon's office via preferred method (i.e, phone, fax) to inform them of need for appointment prior to surgery.  Aspirin is not managed by cardiology, appears to be prescribed post CVA.  Levi Aland, NP-C  07/18/2023, 7:37 AM [address] Office [phone] Fax [phone]

## 2023-07-19 ENCOUNTER — Other Ambulatory Visit (HOSPITAL_COMMUNITY)

## 2023-07-20 ENCOUNTER — Ambulatory Visit (HOSPITAL_COMMUNITY)
Admission: RE | Admit: 2023-07-20 | Discharge: 2023-07-20 | Disposition: A | Discharge status: Home / Self Care | Source: Ambulatory Visit | Attending: Cardiology | Admitting: Cardiology

## 2023-07-20 DIAGNOSIS — I5022 Chronic systolic (congestive) heart failure: Secondary | ICD-10-CM | POA: Insufficient documentation

## 2023-07-20 LAB — BASIC METABOLIC PANEL WITH GFR
Anion gap: 8 (ref 5–15)
BUN: 40 mg/dL — ABNORMAL HIGH (ref 8–23)
CO2: 32 mmol/L (ref 22–32)
Calcium: 10 mg/dL (ref 8.9–10.3)
Chloride: 100 mmol/L (ref 98–111)
Creatinine, Ser: 2.67 mg/dL — ABNORMAL HIGH (ref 0.61–1.24)
GFR, Estimated: 23 mL/min — ABNORMAL LOW (ref >60)
Glucose, Bld: 116 mg/dL — ABNORMAL HIGH (ref 70–99)
Potassium: 3.6 mmol/L (ref 3.5–5.1)
Sodium: 140 mmol/L (ref 135–145)

## 2023-07-20 LAB — BRAIN NATRIURETIC PEPTIDE: B Natriuretic Peptide: 108.9 pg/mL — ABNORMAL HIGH (ref 0.0–100.0)

## 2023-07-20 NOTE — Telephone Encounter (Signed)
 Pt scheduled for 5/15  Cardiac clearance added to appt notes

## 2023-07-23 NOTE — Telephone Encounter (Signed)
   Patient Name: [facility]  DOB: 09/26/1935 MRN: [id number]  Primary Cardiologist: Armanda Magic, MD/[facility]  Chart reviewed as part of pre-operative protocol coverage. As per the [facility] provider Anna Genre, patient requires in-office visit for reassessment and cardiac risk stratification prior to surgery. Per office protocol, the provider seeing this patient should forward their finalized clearance decision to requesting party below. They have scheduled this 09/06/23.   - Will fax update to requesting surgeon so they are aware. Will remove from preop box as separate preop APP input not necessary at this time.   Laurann Montana, PA-C 07/23/2023, 4:03 PM

## 2023-07-24 ENCOUNTER — Telehealth (HOSPITAL_COMMUNITY): Payer: Self-pay | Admitting: Cardiology

## 2023-07-24 NOTE — Telephone Encounter (Signed)
 Per Lillia Abed PA OV is needed for pre op clearance/reassess fluid  Procedure is scheduled 4/11   Returned call to patient [facility]

## 2023-07-26 ENCOUNTER — Ambulatory Visit: Payer: Self-pay

## 2023-07-26 NOTE — Patient Instructions (Signed)
 Visit Information  Thank you for taking time to visit with me today. Please don't hesitate to contact me if I can be of assistance to you.   Following are the goals we discussed today:   Goals Addressed             This Visit's Progress    Health Maintenance-HF and DM       Patient Goals/Self Care Activities: -Patient/Caregiver will take medications as prescribed   -Patient/Caregiver will attend all scheduled provider appointments  -Calls provider office for new concerns, questions, or BP outside discussed parameters -check blood sugar at prescribed times -record values and write them down take them to all doctor visits  -Weigh daily and record (notify MD with 3 lb weight gain over night or 5 lb in a week) -Follow CHF Action Plan -Adhere to low sodium diet   Patient reports doing good. However he is having some issue with urinary retention. Cystoscopy scheduled for 08/03/23.  Reviewed signs of urinary tract infection.  He reports blood sugars around 115. Wt 210 lbs.  Reviewed Diabetes and HF management.  No concerns.          Our next appointment is by telephone on 08/09/23 at 1030 am  Please call the care guide team at [phone] if you need to cancel or reschedule your appointment.   If you are experiencing a Mental Health or Behavioral Health Crisis or need someone to talk to, please call the Suicide and Crisis Lifeline: 988   Patient verbalizes understanding of instructions and care plan provided today and agrees to view in MyChart. Active MyChart status and patient understanding of how to access instructions and care plan via MyChart confirmed with patient.     The patient has been provided with contact information for the care management team and has been advised to call with any health related questions or concerns.   Bary Leriche RN, MSN [facility], [facility] Health RN Care Manager Direct Dial: [phone]  Fax:  [phone] Website: [email or web address]

## 2023-07-26 NOTE — Patient Outreach (Signed)
 Care Coordination   Follow Up Visit Note   07/26/2023 Name: [place] MRN: [id number] DOB: [birth year]  [facility] is a 88 y.o. year old male who sees Koirala, Dibas, MD for primary care. I spoke with  Michaelene Song by phone today.  What matters to the patients health and wellness today?  Urinary retention    Goals Addressed             This Visit's Progress    Health Maintenance-HF and DM       Patient Goals/Self Care Activities: -Patient/Caregiver will take medications as prescribed   -Patient/Caregiver will attend all scheduled provider appointments  -Calls provider office for new concerns, questions, or BP outside discussed parameters -check blood sugar at prescribed times -record values and write them down take them to all doctor visits  -Weigh daily and record (notify MD with 3 lb weight gain over night or 5 lb in a week) -Follow CHF Action Plan -Adhere to low sodium diet   Patient reports doing good. However he is having some issue with urinary retention. Cystoscopy scheduled for 08/03/23.  Reviewed signs of urinary tract infection.  He reports blood sugars around 115. Wt 210 lbs.  Reviewed Diabetes and HF management.  No concerns.          SDOH assessments and interventions completed:  Yes  SDOH Interventions Today    Flowsheet Row Most Recent Value  SDOH Interventions   Food Insecurity Interventions Intervention Not Indicated  Housing Interventions Intervention Not Indicated  Transportation Interventions Intervention Not Indicated  Utilities Interventions Intervention Not Indicated        Care Coordination Interventions:  Yes, provided   Follow up plan: Follow up call scheduled for 4/17    Encounter Outcome:  Patient Visit Completed    Bary Leriche RN, MSN [place]  [facility], [facility] Health RN Care Manager Direct Dial: [phone]  Fax: [phone] Website: [email or web address]

## 2023-07-27 ENCOUNTER — Telehealth (HOSPITAL_COMMUNITY): Payer: Self-pay

## 2023-07-27 NOTE — Telephone Encounter (Signed)
 Called to confirm/remind patient of their appointment at the [facility] on 07/30/2023 9:00.   Appointment:   [x] Confirmed  [] Left mess   [] No answer/No voice mail  [] Phone not in service  Patient reminded to bring all medications and/or complete list.  Confirmed patient has transportation. Gave directions, instructed to utilize valet parking.

## 2023-07-27 NOTE — Telephone Encounter (Signed)
 Add on made 4/7

## 2023-07-29 NOTE — Progress Notes (Signed)
 Date:  07/30/2023   PCP:  Darrow Bussing, MD  Cardiologist:  Armanda Magic, MD Nephrology: Dr. Almon Register HF Cardiology: Dr Shirlee Latch   HPI: Allen Dyer is a 88 y.o. male with a history of DM, rheumatic fever, HTN, hyperlipidemia, CKD IV, prostate CA s/p XRT, systolic HF due to NICM (EF 30-35%) 05/2018, pulmonary venous HTN, and mild nonobstructive CAD.  He served for > 20 years in the [employer] including in [place].    Admitted 3/11-3/18/20 with volume overload. Diuresed with IV lasix. HF team consulted with concern for pulmonary HTN due to ongoing oxygen need. RHC completed and showed moderate pulmonary venous HTN with normal PVR 1.7 (no PAH).   Patient was diagnosed with smoldering myeloma in 7/20, no treatment planned at this time.   Cardiolite in 2/21 showed EF 36%, fixed anteroapical defect, no ischemia.  Echo in 11/21 showed EF 30-35%, diffuse hypokinesis, normal RV, PASP 45 mmHg, dilated IVC.   Seen in ED 08/05/21 with blurred vision. Felt to be lightheadedness vs vertigo. Orthostatics negative. He had PCP follow up and was diagnosed with vertigo.  CVA in 5/23, echo showed EF 35-40%, mild LVH, normal RV.  Carotid dopplers showed minimal disease.  MRA head showed severe vertebral artery disease bilaterally.   Seen for follow-up 03/20. Volume elevated. Torsemide increased to 60 BID. Here today for f/u to assess volume and pre-op evaluation for cystoscopy with urethral dilatation. Reports lower extremity edema improved and UOP increased with higher dose of Torsemide. Home weight stable around 212 lb. Wears compression stockings daily and elevates his feet. He can walk 1 mile without significant limitation. May get short of breath with heavier exertion. He measures his fluid intake and may go over 60 oz at times. Watches sodium intake as well. Wearing CPAP nightly.    Past Surgical History:  Procedure Laterality Date   RIGHT HEART CATH N/A 07/10/2018   Procedure: RIGHT HEART CATH;   Surgeon: Laurey Morale, MD;  Location: [facility] INVASIVE CV LAB;  Service: Cardiovascular;  Laterality: N/A;   RIGHT/LEFT HEART CATH AND CORONARY ANGIOGRAPHY N/A 06/18/2018   Procedure: RIGHT/LEFT HEART CATH AND CORONARY ANGIOGRAPHY;  Surgeon: Lennette Bihari, MD;  Location: [facility] INVASIVE CV LAB;  Service: Cardiovascular;  Laterality: N/A;    Current Outpatient Medications  Medication Sig Dispense Refill   allopurinol (ZYLOPRIM) 100 MG tablet Take 50 mg by mouth every morning.     Ascorbic Acid (VITAMIN C) 1000 MG tablet Take 4,000 mg by mouth every morning.     aspirin 81 MG chewable tablet Chew 1 tablet (81 mg total) by mouth daily. 30 tablet 11   Blood Glucose Monitoring Suppl (FREESTYLE LITE) w/Device KIT 1 Device by Does not apply route daily in the afternoon. 1 kit 0   carvedilol (COREG) 6.25 MG tablet Take 1 tablet (6.25 mg total) by mouth 2 (two) times daily with a meal. 180 tablet 3   Cholecalciferol (VITAMIN D3 PO) Take 1 capsule by mouth every morning.     colchicine 0.6 MG tablet Take 0.6 mg by mouth daily as needed (gout).     cyanocobalamin 1000 MCG tablet Take 1,000 mcg by mouth every morning.     eplerenone (INSPRA) 50 MG tablet Take 1 tablet (50 mg total) by mouth daily. 90 tablet 3   ezetimibe (ZETIA) 10 MG tablet Take 1 tablet (10 mg total) by mouth daily. 30 tablet 3   gabapentin (NEURONTIN) 600 MG tablet Take 300 mg by mouth  2 (two) times daily.     Garlic 1000 MG CAPS Take 1,000 mg by mouth every morning.     glipiZIDE (GLUCOTROL) 5 MG tablet Take 5 mg by mouth 2 (two) times daily before a meal.     glucose blood (FREESTYLE LITE) test strip 1 each by Other route daily in the afternoon. Use as instructed 100 each 3   hydrALAZINE (APRESOLINE) 50 MG tablet Take 1 tablet (50 mg total) by mouth 3 (three) times daily. 6am, 2pm and 10pm     isosorbide mononitrate (IMDUR) 30 MG 24 hr tablet Take 1 tablet (30 mg total) by mouth every morning.     meclizine (ANTIVERT) 25 MG tablet Take  25 mg by mouth 3 (three) times daily as needed for dizziness.     Omega-3 Fatty Acids (FISH OIL) 1200 MG CAPS Take 1,200 mg by mouth daily.     Oyster Shell Calcium 500 MG TABS Take 500 mg by mouth daily.     pantoprazole (PROTONIX) 40 MG tablet Take 1 tablet (40 mg total) by mouth daily. 30 tablet 2   potassium chloride SA (KLOR-CON M) 20 MEQ tablet Take 1 tablet (20 mEq total) by mouth daily. 90 tablet 3   tamsulosin (FLOMAX) 0.4 MG CAPS capsule Take 0.4 mg by mouth 2 (two) times daily.      torsemide (DEMADEX) 20 MG tablet Take 3 tablets (60 mg total) by mouth 2 (two) times daily. 180 tablet 5   No current facility-administered medications for this encounter.    Allergies:   Tramadol, Finasteride, Jardiance [empagliflozin], Lisinopril, Ciprofloxacin, Simvastatin, and Sulfa antibiotics   Social History:  The patient  reports that he quit smoking about 52 years ago. His smoking use included cigarettes. He started smoking about 62 years ago. He has a 20 pack-year smoking history. He has never used smokeless tobacco. He reports that he does not currently use alcohol. He reports that he does not use drugs.   Family History:  The patient's family history includes Cancer in his mother and sister; Congestive Heart Failure in his brother and father.   ROS:  Please see the history of present illness.   All other systems are personally reviewed and negative.   Wt Readings from Last 3 Encounters:  07/30/23 98.8 kg (217 lb 12.8 oz)  07/12/23 99.3 kg (219 lb)  03/09/23 98.9 kg (218 lb)   BP 132/80   Pulse 68   Wt 98.8 kg (217 lb 12.8 oz)   SpO2 97%   BMI 33.61 kg/m   Physical Exam:   General:  Elderly male. Appears younger than stated age. Neck: no JVD.  Cor: Regular rate & rhythm. No rubs, gallops or murmurs. Lungs: clear Abdomen: soft, nontender, nondistended. Extremities: 1+ edema, + TED hose Neuro: alert & orientedx3. Affect pleasant    Recent Labs: 04/19/2023: ALT 17; Hemoglobin  12.2; Platelets 184 07/20/2023: B Natriuretic Peptide 108.9; BUN 40; Creatinine, Ser 2.67; Potassium 3.6; Sodium 140  Personally reviewed     ASSESSMENT AND PLAN: 1. Chronic systolic HF:  New diagnosis 05/2018, EF 30-35% by echo. Due to NICM. LHC 05/2018 with mild non-osbtructive CAD (50% proximal LAD stenosis). Has had high BP for a long time, but it has generally been controlled recently. Had palpitations in past, but very few PVCs on telemetry when in the hospital. Prior hx of ETOH abuse, but none in 40+ years. Did not receive chemotherapy with prior prostate cancer. QRS is wide on EKG, but it is RBBB.  He has multiple myeloma but he does not have ventricular wall thickening that would be suggestive of cardiac amyloidosis.  RHC 3/20 with mildly elevated filling pressures, CO preserved, pulmonary venous HTN. No PAH.  Echo in 7/20 showed that EF remains low at 25-30% with moderate LV dilation, no LVH, and normal RV.  Echo in 11/21 showed EF 30-35%, diffuse hypokinesis, normal RV, PASP 45 mmHg, dilated IVC. Echo (3/23) EF 30-35%, echo in 5/23 showed EF 35-40%, mild LVH, normal RV.   - NYHA II. Volume looks good on exam and by ReDS which is 35%. - Continue carvedilol 6.25 mg bid.   - No ACE/ARB/ARNI with CKD stage 3.  - Off SGLT2i with yeast infection/frequent UTIs. - Continue hydralazine 50 mg tid and Imdur 30 mg daily.  - Continue eplerenone 50 mg daily (breast tenderness with spiro).  - Check BMET/BNP today - With advanced age and nonischemic etiology of cardiomyopathy, would avoid ICD.  He has RBBB so would not likely benefit from CRT.   2. CKD Stage IV: Last creatinine 2.7. Labs today.  - Follows with Nephrology - No SGLT2i with urinary retention/yeast infections  3. CAD: 50% pLAD on LHC in 2/20. Cardiolite in 2/21 showed no ischemia.   - Continue Zetia. Does not tolerate statins - Continue ASA 81.    4. Severe OSA: Continue CPAP.  - Reports adherence.  5. Pulmonary hypertension: He had  pulmonary venous hypertension based on prior RHC.   6. Smoldering myeloma:  No plans for chemotherapy treatment. Sees Dr Bertis Ruddy.   7. CVA: In 5/23.  He has severe vertebral artery disease.  Neuro recommends goal BP between 120-140. - Continue ASA 81. Plavix stopped by Neuro in 04/24. - Has not tolerated statins. Continue Zetia.  8. Preop cardiac risk assessment: Plans to undergo cystoscopy with retrograde urethrogram and urethral dilatation with Dr. Cardell Peach under general anesthesia. His risk for peri-procedural complications is at least moderately increased d/t his advanced age, CHF and OSA. If possible, would use moderate sedation instead of general anesthesia. Okay to hold aspirin from cardiac standpoint 5 days prior to the procedure. However, would need to ensure okay with Neurology (hx CVA and has vertebral artery disease).  Follow up: As scheduled in May to assess volume.     Roxene Alviar N, PA-C  07/30/2023

## 2023-07-30 ENCOUNTER — Ambulatory Visit (HOSPITAL_COMMUNITY)
Admission: RE | Admit: 2023-07-30 | Discharge: 2023-07-30 | Disposition: A | Discharge status: Home / Self Care | Source: Ambulatory Visit | Attending: Physician Assistant | Admitting: Physician Assistant

## 2023-07-30 ENCOUNTER — Encounter (HOSPITAL_COMMUNITY): Payer: Self-pay

## 2023-07-30 ENCOUNTER — Telehealth (HOSPITAL_COMMUNITY): Payer: Self-pay

## 2023-07-30 VITALS — BP 132/80 | HR 68 | Wt 217.8 lb

## 2023-07-30 DIAGNOSIS — I2510 Atherosclerotic heart disease of native coronary artery without angina pectoris: Secondary | ICD-10-CM | POA: Diagnosis not present

## 2023-07-30 DIAGNOSIS — Z8546 Personal history of malignant neoplasm of prostate: Secondary | ICD-10-CM | POA: Diagnosis not present

## 2023-07-30 DIAGNOSIS — N184 Chronic kidney disease, stage 4 (severe): Secondary | ICD-10-CM | POA: Diagnosis not present

## 2023-07-30 DIAGNOSIS — E1122 Type 2 diabetes mellitus with diabetic chronic kidney disease: Secondary | ICD-10-CM | POA: Diagnosis not present

## 2023-07-30 DIAGNOSIS — I5022 Chronic systolic (congestive) heart failure: Secondary | ICD-10-CM | POA: Diagnosis not present

## 2023-07-30 DIAGNOSIS — Z823 Family history of stroke: Secondary | ICD-10-CM | POA: Diagnosis not present

## 2023-07-30 DIAGNOSIS — I272 Pulmonary hypertension, unspecified: Secondary | ICD-10-CM | POA: Insufficient documentation

## 2023-07-30 DIAGNOSIS — Z01810 Encounter for preprocedural cardiovascular examination: Secondary | ICD-10-CM

## 2023-07-30 DIAGNOSIS — I428 Other cardiomyopathies: Secondary | ICD-10-CM | POA: Diagnosis not present

## 2023-07-30 DIAGNOSIS — D472 Monoclonal gammopathy: Secondary | ICD-10-CM | POA: Insufficient documentation

## 2023-07-30 DIAGNOSIS — I130 Hypertensive heart and chronic kidney disease with heart failure and stage 1 through stage 4 chronic kidney disease, or unspecified chronic kidney disease: Secondary | ICD-10-CM | POA: Insufficient documentation

## 2023-07-30 DIAGNOSIS — G4733 Obstructive sleep apnea (adult) (pediatric): Secondary | ICD-10-CM | POA: Diagnosis not present

## 2023-07-30 DIAGNOSIS — Z8673 Personal history of transient ischemic attack (TIA), and cerebral infarction without residual deficits: Secondary | ICD-10-CM | POA: Diagnosis not present

## 2023-07-30 DIAGNOSIS — E785 Hyperlipidemia, unspecified: Secondary | ICD-10-CM | POA: Insufficient documentation

## 2023-07-30 LAB — BASIC METABOLIC PANEL WITH GFR
Anion gap: 7 (ref 5–15)
BUN: 38 mg/dL — ABNORMAL HIGH (ref 8–23)
CO2: 32 mmol/L (ref 22–32)
Calcium: 9.4 mg/dL (ref 8.9–10.3)
Chloride: 104 mmol/L (ref 98–111)
Creatinine, Ser: 2.55 mg/dL — ABNORMAL HIGH (ref 0.61–1.24)
GFR, Estimated: 24 mL/min — ABNORMAL LOW (ref >60)
Glucose, Bld: 154 mg/dL — ABNORMAL HIGH (ref 70–99)
Potassium: 3.5 mmol/L (ref 3.5–5.1)
Sodium: 143 mmol/L (ref 135–145)

## 2023-07-30 LAB — BRAIN NATRIURETIC PEPTIDE: B Natriuretic Peptide: 228.7 pg/mL — ABNORMAL HIGH (ref 0.0–100.0)

## 2023-07-30 MED ORDER — POTASSIUM CHLORIDE CRYS ER 20 MEQ PO TBCR
40.0000 meq | EXTENDED_RELEASE_TABLET | Freq: Every day | ORAL | 3 refills | Status: DC
Start: 2023-07-30 — End: 2023-09-26

## 2023-07-30 NOTE — Progress Notes (Signed)
 ReDS Vest / Clip - 07/30/23 1300       ReDS Vest / Clip   Station Marker D    Ruler Value 32    ReDS Value Range Moderate volume overload    ReDS Actual Value 35

## 2023-07-30 NOTE — Addendum Note (Signed)
 Encounter addended by: Noralee Space, RN on: 07/30/2023 1:30 PM  Actions taken: Flowsheet accepted, Clinical Note Signed, Order list changed, Diagnosis association updated, Result filed, Charge Capture section accepted

## 2023-07-30 NOTE — Patient Instructions (Addendum)
 Great to see you today!!!  No changes, please continue current medications  Labs done today, your results will be available in MyChart, we will contact you for abnormal readings.  Your physician recommends that you schedule a follow-up appointment in: as scheduled 5/15  If you have any questions or concerns before your next appointment please send [place] a message through [place] or call our office at [phone].    TO LEAVE A MESSAGE FOR THE NURSE SELECT OPTION 2, PLEASE LEAVE A MESSAGE INCLUDING: YOUR NAME DATE OF BIRTH CALL BACK NUMBER REASON FOR CALL**this is important as we prioritize the call backs  YOU WILL RECEIVE A CALL BACK THE SAME DAY AS LONG AS YOU CALL BEFORE 4:00 PM  At the [facility], you and your health needs are our priority. As part of our continuing mission to provide you with exceptional heart care, we have created designated Provider Care Teams. These Care Teams include your primary Cardiologist (physician) and Advanced Practice Providers (APPs- Physician Assistants and Nurse Practitioners) who all work together to provide you with the care you need, when you need it.   You may see any of the following providers on your designated Care Team at your next follow up: Dr Arvilla Meres Dr Marca Ancona Dr. Dorthula Nettles Dr. Clearnce Hasten Amy Filbert Schilder, NP Robbie Lis, [place] [facility] [place], [place] Brynda Peon, NP [place] Lee, NP Clarisa Kindred, NP Karle Plumber, PharmD Enos Fling, PharmD   Please be sure to bring in all your medications bottles to every appointment.    Thank you for choosing [facility]

## 2023-07-30 NOTE — Telephone Encounter (Addendum)
 Pt aware, agreeable, and verbalized understanding  Rx updated  ----- Message from [facility], [facility] sent at 07/30/2023 11:51 AM EDT ----- His reds was okay today but BNP higher. Taking 60 mg Torsemide BID, would have him take 80 BID X 3 days then go back to 60 BID to try to optimize him for procedure. Takes 20 mEq K daily, increase to 40 mEq daily

## 2023-07-31 ENCOUNTER — Encounter (HOSPITAL_COMMUNITY): Admission: RE | Admit: 2023-07-31 | Source: Ambulatory Visit

## 2023-08-02 DIAGNOSIS — H6123 Impacted cerumen, bilateral: Secondary | ICD-10-CM | POA: Diagnosis not present

## 2023-08-03 ENCOUNTER — Ambulatory Visit (HOSPITAL_COMMUNITY): Admit: 2023-08-03 | Admitting: Urology

## 2023-08-03 SURGERY — CYSTOSCOPY, WITH URETHRAL DILATION
Anesthesia: General

## 2023-08-08 DIAGNOSIS — N390 Urinary tract infection, site not specified: Secondary | ICD-10-CM | POA: Diagnosis not present

## 2023-08-08 DIAGNOSIS — R8290 Unspecified abnormal findings in urine: Secondary | ICD-10-CM | POA: Diagnosis not present

## 2023-08-08 DIAGNOSIS — I5022 Chronic systolic (congestive) heart failure: Secondary | ICD-10-CM | POA: Diagnosis not present

## 2023-08-08 DIAGNOSIS — N184 Chronic kidney disease, stage 4 (severe): Secondary | ICD-10-CM | POA: Diagnosis not present

## 2023-08-09 ENCOUNTER — Other Ambulatory Visit: Payer: Self-pay

## 2023-08-16 ENCOUNTER — Telehealth: Payer: Self-pay

## 2023-08-16 NOTE — Patient Outreach (Signed)
 Complex Care Management   Visit Note  08/16/2023  Name:  Allen Dyer MRN: [id number] DOB: Nov 05, 1935  Situation: Referral received for Complex Care Management related to Diabetes with Complications and BPH  I obtained verbal consent from Patient.  Visit completed with patient  on the phone  Background:   Past Medical History:  Diagnosis Date   Chronic combined systolic and diastolic CHF (congestive heart failure) (HCC)    CKD (chronic kidney disease), stage III (HCC)    Hyperlipidemia    Hypertension    Lower extremity edema    Mild CAD    a. mild-mod by cath 05/2018.   Multiple myeloma (HCC) 11/15/2018   NICM (nonischemic cardiomyopathy) (HCC)    Peripheral neuropathy    Prostate cancer (HCC)    Status post XRT   Rheumatic fever    Trifascicular block    Type 2 diabetes mellitus (HCC)     Assessment: Patient Reported Symptoms:  Cognitive Cognitive Status: Alert and oriented to person, place, and time, Normal speech and language skills, Insightful and able to interpret abstract concepts   Health Maintenance Behaviors: Annual physical exam, Exercise, Healthy diet, Sleep adequate, Social activities, Spiritual practice(s)  Neurological Neurological Review of Symptoms: No symptoms reported (Occasional dizziness with position changes, takes care when going from lying/sitting to standing.) Neurological Management Strategies: Coping strategies Neurological Self-Management Outcome: 4 (good)  HEENT HEENT Symptoms Reported: No symptoms reported (Goes to eye MD at least annually, goes to ENT approx. every 4 months for ear wax removal, otherwise no problems with hearing. Gave contact info for [facility] in [place].) HEENT Conditions: Tooth problem(s) Tooth Problems: missing (Upper denture are broken, not wearing them.) Tooth problem(s)  Cardiovascular   Cardiovascular Conditions: Heart failure, Hypertension, High blood cholesterol Cardiovascular Management Strategies:  Medication therapy, Diet modification, Exercise, Routine screening Weight: 215 lb (97.5 kg) (Taken middle of the day, with clothes and shoes)  Respiratory Respiratory Symptoms Reported: Shortness of breath (SHOB on when "kneeling down to pray" states MD aware-this is at baseline, otherwise he walks outside for exercise and does NOT experience [facility].)    Endocrine Patient reports the following symptoms related to hypoglycemia or hyperglycemia : No symptoms reported (On 07/18/23 A1C 8.1.  Per Endo summary notes on 03/09/23 A1C goal is <7.5%.) Is patient diabetic?: Yes Is patient checking blood sugars at home?: Yes Endocrine Conditions: Diabetes Endocrine Management Strategies: Medication therapy, Diet modification, Exercise Endocrine Self-Management Outcome: 3 (uncertain)  Gastrointestinal Gastrointestinal Symptoms Reported: No symptoms reported      Genitourinary Genitourinary Symptoms Reported: Other (Difficulty starting full stream and emptying bladder - he is seeing urology, was supposed to have a balloon procedure but Cardio did not approve.  He is taking tamsulosin  as prescribed, takes his time urinating to try and empty bladder.) Genitourinary Conditions: Difficulty voiding Genitourinary Management Strategies: Bladder program, Medication therapy Bladder Program: Positioning Genitourinary Self-Management Outcome: 3 (uncertain)  Integumentary Integumentary Symptoms Reported: No symptoms reported    Musculoskeletal Musculoskelatal Symptoms Reviewed: No symptoms reported   Falls in the past year?: No    Psychosocial       Quality of Family Relationships: helpful, involved, supportive Do you feel physically threatened by others?: No      08/16/2023    2:17 PM  Depression screen PHQ 2/9  Decreased Interest 0  Down, Depressed, Hopeless 0  PHQ - 2 Score 0    Vitals:   08/16/23 1443  BP: 129/75  Pulse: 67    Medications Reviewed Today  Reviewed by Allen Marble, Allen Dyer  (Registered Nurse) on 08/16/23 at 1409  Med List Status: <None>   Medication Order Taking? Sig Documenting Provider Last Dose Status Informant  allopurinol  (ZYLOPRIM ) 100 MG tablet [id number] Yes Take 50 mg by mouth every morning. [provider] Taking Active Self  Ascorbic Acid (VITAMIN C) 1000 MG tablet [id number] Yes Take 4,000 mg by mouth every morning. [provider] Taking Active Self           Med Note Allen Dyer   Mon Jan 16, 2022  2:44 PM)    aspirin  81 MG chewable tablet [id number] Yes Chew 1 tablet (81 mg total) by mouth daily. Dyer, Allen K, MD Taking Active   Blood Glucose Monitoring Suppl (FREESTYLE LITE) w/Device KIT [id number] Yes 1 Device by Does not apply route daily in the afternoon. Allen Dyer, Allen Jaralla, MD Taking Active Other  carvedilol  (COREG ) 6.25 MG tablet [id number] Yes Take 1 tablet (6.25 mg total) by mouth 2 (two) times daily with a meal. Allen Eisenmenger, MD Taking Active   Cholecalciferol (VITAMIN D3 PO) [id number] Yes Take 1 capsule by mouth every morning. [provider] Taking Active Self           Med Note Allen Dyer   Mon Jan 16, 2022  2:44 PM)    colchicine 0.6 MG tablet [id number] Yes Take 0.6 mg by mouth daily as needed (gout). [provider] Taking Active   cyanocobalamin  1000 MCG tablet [id number] Yes Take 1,000 mcg by mouth every morning. [provider] Taking Active Self           Med Note Allen Dyer   Mon Jan 16, 2022  2:46 PM)    eplerenone  (INSPRA ) 50 MG tablet [id number] Yes Take 1 tablet (50 mg total) by mouth daily. Allen Eisenmenger, MD Taking Active Self, Pharmacy Records  ezetimibe  (ZETIA ) 10 MG tablet [id number] Yes Take 1 tablet (10 mg total) by mouth daily. Allen Castleman, MD Taking Active   gabapentin  (NEURONTIN ) 600 MG tablet [id number] Yes Take 300 mg by mouth 2 (two) times daily. [provider] Taking Active Self  Garlic 1000 MG CAPS [id number] Yes Take 1,000 mg by  mouth every morning. [provider] Taking Active Self           Med Note Allen Dyer   Mon Jan 16, 2022  2:45 PM)    glipiZIDE  (GLUCOTROL ) 5 MG tablet [id number] Yes Take 5 mg by mouth 2 (two) times daily before a meal. [provider] Taking Active   glucose blood (FREESTYLE LITE) test strip [id number] Yes 1 each by Other route daily in the afternoon. Use as instructed Allen Dyer, Allen Obey, MD Taking Active Self, Pharmacy Records  hydrALAZINE  (APRESOLINE ) 50 MG tablet [id number] Yes Take 1 tablet (50 mg total) by mouth 3 (three) times daily. 6am, 2pm and 10pm Allen Castleman, MD Taking Active Self  isosorbide  mononitrate (IMDUR ) 30 MG 24 hr tablet [id number] Yes Take 1 tablet (30 mg total) by mouth every morning. Dyer, Allen K, MD Taking Active   meclizine (ANTIVERT) 25 MG tablet [id number] Yes Take 25 mg by mouth 3 (three) times daily as needed for dizziness. [provider] Taking Active   Omega-3 Fatty Acids (FISH OIL) 1200 MG CAPS [id number] Yes Take 1,200 mg by mouth daily. [provider] Taking Active   [facility] Calcium  500 MG TABS [id number] Yes Take 500 mg by mouth daily. [provider]  Taking Active   pantoprazole  (PROTONIX ) 40 MG tablet [id number] No Take 1 tablet (40 mg total) by mouth daily.  Patient not taking: Reported on 08/16/2023   Dyer, Allen K, MD Not Taking Active   potassium chloride  SA (KLOR-CON  M) 20 MEQ tablet [id number] Yes Take 2 tablets (40 mEq total) by mouth daily. Allen Belt, PA-C Taking Active   tamsulosin  (FLOMAX ) 0.4 MG CAPS capsule [id number] Yes Take 0.4 mg by mouth 2 (two) times daily.  [provider] Taking Active Self, Pharmacy Records  torsemide  (DEMADEX ) 20 MG tablet [id number] Yes Take 3 tablets (60 mg total) by mouth 2 (two) times daily. Allen Dyer, [place] Taking Active             Recommendation:   Specialty provider follow-up : Cardiology 09/06/23,  Endocrinology 09/12/23 He will call Urology for signs/symptoms of UTI or increased difficulty voiding.   Follow Up Plan:   Telephone follow-up in 1 month  Allen Dyer BSN, CCM [place]  VBCI Population Health Allen Dyer Care Manager Direct Dial: [phone]  Fax: [phone]

## 2023-08-16 NOTE — Patient Instructions (Signed)
 Visit Information  Thank you for taking time to visit with me today. Please don't hesitate to contact me if I can be of assistance to you before our next scheduled appointment.  Our next appointment is by telephone on Monday, May 19th  at 11:45am. Please call the care guide team at [phone] if you need to cancel or reschedule your appointment.   Following is a copy of your care plan:   Goals Addressed             This Visit's Progress    VBCI RN Care Plan       Problems:  Chronic Disease Management support and education needs related to DMII  Goal: Over the next 6 months the Patient will demonstrate Improved adherence to prescribed treatment plan for DMII as evidenced by A1C at goal.   Interventions:   Diabetes Interventions: Assessed patient's understanding of A1c goal:  <7.5% per Endo summary notes dated 03/09/2023.  Per PCP summary notes 07/18/23, A1C 8.1%, labs taken that day.  Reviewed medications with patient and discussed importance of medication adherence Counseled on importance of regular laboratory monitoring as prescribed Discussed plans with patient for ongoing care management follow up and provided patient with direct contact information for care management team Assessed social determinant of health barriers Lab Results  Component Value Date   HGBA1C 7.1 (A) 03/09/2023    Patient Self-Care Activities:  Call provider office for new concerns or questions  Take medications as prescribed    Plan:  Telephone follow up appointment with care management team member scheduled for:  09/10/23 at 11:45am.           VBCI RN Care Plan       Problems:  Chronic Disease Management support and education needs related to BPH  Goal: Over the next 6 months the Patient will continue to work with Medical illustrator and/or Social Worker to address care management and care coordination needs related to BPH as evidenced by adherence to care management team scheduled appointments       Interventions:   Evaluation of current treatment plan related to BPH,  urinary  self-management and patient's adherence to plan as established by provider. Discussed plans with patient for ongoing care management follow up and provided patient with direct contact information for care management team Discussed plans with patient for ongoing care management follow up and provided patient with direct contact information for care management team Advised patient to discuss any urinary changes, signs/symptoms of UTI with provider Screening for signs and symptoms of depression related to chronic disease state  Assessed social determinant of health barriers  Patient Self-Care Activities:  Call provider office for new concerns or questions  Take medications as prescribed    Plan:  Telephone follow up appointment with care management team member scheduled for:  Monday, May 19th at 11:45am             Please call the Suicide and Crisis Lifeline: 988 if you are experiencing a Mental Health or Behavioral Health Crisis or need someone to talk to.  Patient verbalizes understanding of instructions and care plan provided today and agrees to view in MyChart. Active MyChart status and patient understanding of how to access instructions and care plan via MyChart confirmed with patient.     Jurline Olmsted BSN, CCM [place]  VBCI Population Health RN Care Manager Direct Dial: [phone]  Fax: [phone]

## 2023-08-25 ENCOUNTER — Other Ambulatory Visit (HOSPITAL_BASED_OUTPATIENT_CLINIC_OR_DEPARTMENT_OTHER): Payer: Self-pay

## 2023-08-25 ENCOUNTER — Emergency Department (HOSPITAL_BASED_OUTPATIENT_CLINIC_OR_DEPARTMENT_OTHER)
Admission: EM | Admit: 2023-08-25 | Discharge: 2023-08-25 | Disposition: A | Discharge status: Home / Self Care | Attending: Emergency Medicine | Admitting: Emergency Medicine

## 2023-08-25 ENCOUNTER — Emergency Department (HOSPITAL_BASED_OUTPATIENT_CLINIC_OR_DEPARTMENT_OTHER)

## 2023-08-25 ENCOUNTER — Other Ambulatory Visit: Payer: Self-pay

## 2023-08-25 ENCOUNTER — Encounter (HOSPITAL_BASED_OUTPATIENT_CLINIC_OR_DEPARTMENT_OTHER): Payer: Self-pay | Admitting: Emergency Medicine

## 2023-08-25 DIAGNOSIS — Z79899 Other long term (current) drug therapy: Secondary | ICD-10-CM | POA: Diagnosis not present

## 2023-08-25 DIAGNOSIS — R29818 Other symptoms and signs involving the nervous system: Secondary | ICD-10-CM | POA: Diagnosis not present

## 2023-08-25 DIAGNOSIS — E1122 Type 2 diabetes mellitus with diabetic chronic kidney disease: Secondary | ICD-10-CM | POA: Insufficient documentation

## 2023-08-25 DIAGNOSIS — N189 Chronic kidney disease, unspecified: Secondary | ICD-10-CM | POA: Insufficient documentation

## 2023-08-25 DIAGNOSIS — I6381 Other cerebral infarction due to occlusion or stenosis of small artery: Secondary | ICD-10-CM | POA: Diagnosis not present

## 2023-08-25 DIAGNOSIS — I509 Heart failure, unspecified: Secondary | ICD-10-CM | POA: Diagnosis not present

## 2023-08-25 DIAGNOSIS — Z7982 Long term (current) use of aspirin: Secondary | ICD-10-CM | POA: Diagnosis not present

## 2023-08-25 DIAGNOSIS — I130 Hypertensive heart and chronic kidney disease with heart failure and stage 1 through stage 4 chronic kidney disease, or unspecified chronic kidney disease: Secondary | ICD-10-CM | POA: Insufficient documentation

## 2023-08-25 DIAGNOSIS — R42 Dizziness and giddiness: Secondary | ICD-10-CM | POA: Insufficient documentation

## 2023-08-25 DIAGNOSIS — R519 Headache, unspecified: Secondary | ICD-10-CM | POA: Diagnosis not present

## 2023-08-25 DIAGNOSIS — Z8673 Personal history of transient ischemic attack (TIA), and cerebral infarction without residual deficits: Secondary | ICD-10-CM | POA: Diagnosis not present

## 2023-08-25 HISTORY — DX: Cerebral infarction, unspecified: I63.9

## 2023-08-25 HISTORY — DX: Dizziness and giddiness: R42

## 2023-08-25 LAB — CBC WITH DIFFERENTIAL/PLATELET
Abs Immature Granulocytes: 0.01 10*3/uL (ref 0.00–0.07)
Basophils Absolute: 0 10*3/uL (ref 0.0–0.1)
Basophils Relative: 0 %
Eosinophils Absolute: 0.1 10*3/uL (ref 0.0–0.5)
Eosinophils Relative: 3 %
HCT: 32.7 % — ABNORMAL LOW (ref 39.0–52.0)
Hemoglobin: 10.7 g/dL — ABNORMAL LOW (ref 13.0–17.0)
Immature Granulocytes: 0 %
Lymphocytes Relative: 21 %
Lymphs Abs: 0.9 10*3/uL (ref 0.7–4.0)
MCH: 33.3 pg (ref 26.0–34.0)
MCHC: 32.7 g/dL (ref 30.0–36.0)
MCV: 101.9 fL — ABNORMAL HIGH (ref 80.0–100.0)
Monocytes Absolute: 0.4 10*3/uL (ref 0.1–1.0)
Monocytes Relative: 10 %
Neutro Abs: 2.9 10*3/uL (ref 1.7–7.7)
Neutrophils Relative %: 66 %
Platelets: 153 10*3/uL (ref 150–400)
RBC: 3.21 MIL/uL — ABNORMAL LOW (ref 4.22–5.81)
RDW: 13.3 % (ref 11.5–15.5)
WBC: 4.4 10*3/uL (ref 4.0–10.5)
nRBC: 0 % (ref 0.0–0.2)

## 2023-08-25 LAB — COMPREHENSIVE METABOLIC PANEL WITH GFR
ALT: 11 U/L (ref 0–44)
AST: 20 U/L (ref 15–41)
Albumin: 3.8 g/dL (ref 3.5–5.0)
Alkaline Phosphatase: 58 U/L (ref 38–126)
Anion gap: 9 (ref 5–15)
BUN: 28 mg/dL — ABNORMAL HIGH (ref 8–23)
CO2: 32 mmol/L (ref 22–32)
Calcium: 9.9 mg/dL (ref 8.9–10.3)
Chloride: 100 mmol/L (ref 98–111)
Creatinine, Ser: 2.51 mg/dL — ABNORMAL HIGH (ref 0.61–1.24)
GFR, Estimated: 24 mL/min — ABNORMAL LOW (ref >60)
Glucose, Bld: 145 mg/dL — ABNORMAL HIGH (ref 70–99)
Potassium: 4.1 mmol/L (ref 3.5–5.1)
Sodium: 140 mmol/L (ref 135–145)
Total Bilirubin: 0.3 mg/dL (ref 0.0–1.2)
Total Protein: 6.6 g/dL (ref 6.5–8.1)

## 2023-08-25 MED ORDER — MECLIZINE HCL 25 MG PO TABS
25.0000 mg | ORAL_TABLET | Freq: Once | ORAL | Status: AC
  Administered 2023-08-25: 25 mg via ORAL
  Filled 2023-08-25: qty 1

## 2023-08-25 MED ORDER — MECLIZINE HCL 12.5 MG PO TABS
25.0000 mg | ORAL_TABLET | Freq: Two times a day (BID) | ORAL | 0 refills | Status: DC | PRN
  Filled 2023-08-25: qty 60, 15d supply, fill #0

## 2023-08-25 NOTE — ED Provider Notes (Signed)
 [place] [facility]MENT AT [facility] Provider Note   CSN: [id number] Arrival date & time: 08/25/23  1146     History  Chief Complaint  Patient presents with   Dizziness    Allen Dyer is a 88 y.o. male.  Is here with dizziness.  History of vertigo stroke.  History of CKD high cholesterol CHF multiple myeloma.  Overall he has been having dizzy symptoms for the last 3 days comes and goes but been pretty persistent.  Seems that it is worse with movement but he is having a hard time walking.  He had his ears cleaned out recently.  He denies any ringing in his ear or hearing loss.  No speech changes vision changes weakness numbness tingling otherwise.  But cannot really tell if this is vertigo or if it is like his old stroke/new stroke.  Does not use any assistive device when he walks.  He denies any chest pain shortness of breath abdominal pain nausea vomiting diarrhea.  The history is provided by the patient.       Home Medications Prior to Admission medications   Medication Sig Start Date End Date Taking? Authorizing Provider  meclizine (ANTIVERT) 25 MG tablet Take 1 tablet (25 mg total) by mouth 2 (two) times daily as needed for up to 30 doses for dizziness. 08/25/23  Yes Latese Dufault, DO  allopurinol  (ZYLOPRIM ) 100 MG tablet Take 50 mg by mouth every morning. 10/11/20   [provider]  Ascorbic Acid (VITAMIN C) 1000 MG tablet Take 4,000 mg by mouth every morning.    [provider]  aspirin  81 MG chewable tablet Chew 1 tablet (81 mg total) by mouth daily. 09/14/21   Singh, Prashant K, MD  Blood Glucose Monitoring Suppl (FREESTYLE LITE) w/Device KIT 1 Device by Does not apply route daily in the afternoon. 05/24/21   Shamleffer, Ibtehal Jaralla, MD  carvedilol  (COREG ) 6.25 MG tablet Take 1 tablet (6.25 mg total) by mouth 2 (two) times daily with a meal. 01/16/22   Darlis Eisenmenger, MD  Cholecalciferol (VITAMIN D3 PO) Take 1 capsule by mouth every  morning.    [provider]  colchicine 0.6 MG tablet Take 0.6 mg by mouth daily as needed (gout).    [provider]  cyanocobalamin  1000 MCG tablet Take 1,000 mcg by mouth every morning.    [provider]  eplerenone  (INSPRA ) 50 MG tablet Take 1 tablet (50 mg total) by mouth daily. 06/23/21   Darlis Eisenmenger, MD  ezetimibe  (ZETIA ) 10 MG tablet Take 1 tablet (10 mg total) by mouth daily. 09/14/21   Singh, Prashant K, MD  gabapentin  (NEURONTIN ) 600 MG tablet Take 300 mg by mouth 2 (two) times daily. 04/28/20   [provider]  Garlic 1000 MG CAPS Take 1,000 mg by mouth every morning.    [provider]  glipiZIDE  (GLUCOTROL ) 5 MG tablet Take 5 mg by mouth 2 (two) times daily before a meal.    [provider]  glucose blood (FREESTYLE LITE) test strip 1 each by Other route daily in the afternoon. Use as instructed 05/24/21   Shamleffer, Ibtehal Jaralla, MD  hydrALAZINE  (APRESOLINE ) 50 MG tablet Take 1 tablet (50 mg total) by mouth 3 (three) times daily. 6am, 2pm and 10pm 09/15/21   Singh, Prashant K, MD  isosorbide  mononitrate (IMDUR ) 30 MG 24 hr tablet Take 1 tablet (30 mg total) by mouth every morning. 09/14/21   Singh, Prashant K, MD  Omega-3 Fatty  Acids (FISH OIL) 1200 MG CAPS Take 1,200 mg by mouth daily.    [provider]  Glynda Lash Calcium  500 MG TABS Take 500 mg by mouth daily.    [provider]  pantoprazole  (PROTONIX ) 40 MG tablet Take 1 tablet (40 mg total) by mouth daily. Patient not taking: Reported on 08/16/2023 09/14/21   Singh, Prashant K, MD  potassium chloride  SA (KLOR-CON  M) 20 MEQ tablet Take 2 tablets (40 mEq total) by mouth daily. 07/30/23   Arleene Belt, PA-C  tamsulosin  (FLOMAX ) 0.4 MG CAPS capsule Take 0.4 mg by mouth 2 (two) times daily.  03/24/17   [provider]  torsemide  (DEMADEX ) 20 MG tablet Take 3 tablets (60 mg total) by mouth 2 (two) times daily. 07/12/23   Arleene Belt,  PA-C      Allergies    Tramadol, Finasteride, Jardiance  [empagliflozin ], Lisinopril, Ciprofloxacin, Simvastatin, and Sulfa antibiotics    Review of Systems   Review of Systems  Physical Exam Updated Vital Signs BP 134/84 (BP Location: Left Arm)   Pulse 63   Temp 98.4 F (36.9 C)   Resp 16   SpO2 95%  Physical Exam Vitals and nursing note reviewed.  Constitutional:      General: He is not in acute distress.    Appearance: He is well-developed. He is not ill-appearing.  HENT:     Head: Normocephalic and atraumatic.     Mouth/Throat:     Mouth: Mucous membranes are moist.  Eyes:     Extraocular Movements: Extraocular movements intact.     Conjunctiva/sclera: Conjunctivae normal.     Pupils: Pupils are equal, round, and reactive to light.  Cardiovascular:     Rate and Rhythm: Normal rate and regular rhythm.     Pulses: Normal pulses.     Heart sounds: Normal heart sounds. No murmur heard. Pulmonary:     Effort: Pulmonary effort is normal. No respiratory distress.     Breath sounds: Normal breath sounds.  Abdominal:     Palpations: Abdomen is soft.     Tenderness: There is no abdominal tenderness.  Musculoskeletal:        General: No swelling.     Cervical back: Normal range of motion and neck supple.  Skin:    General: Skin is warm and dry.     Capillary Refill: Capillary refill takes less than 2 seconds.  Neurological:     General: No focal deficit present.     Mental Status: He is alert and oriented to person, place, and time.     Cranial Nerves: No cranial nerve deficit.     Sensory: No sensory deficit.     Motor: No weakness.     Coordination: Coordination normal.     Comments: 5+ out of 5 strength throughout, normal sensation, no drift, normal speech, unsteady gait, no nystagmus, normal visual fields  Psychiatric:        Mood and Affect: Mood normal.     ED Results / Procedures / Treatments   Labs (all labs ordered are listed, but only abnormal results  are displayed) Labs Reviewed  CBC WITH DIFFERENTIAL/PLATELET - Abnormal; Notable for the following components:      Result Value   RBC 3.21 (*)    Hemoglobin 10.7 (*)    HCT 32.7 (*)    MCV 101.9 (*)    All other components within normal limits  COMPREHENSIVE METABOLIC PANEL WITH GFR - Abnormal; Notable for the following components:  Glucose, Bld 145 (*)    BUN 28 (*)    Creatinine, Ser 2.51 (*)    GFR, Estimated 24 (*)    All other components within normal limits    EKG EKG Interpretation Date/Time:  Saturday Aug 25 2023 12:09:20 EDT Ventricular Rate:  62 PR Interval:  235 QRS Duration:  171 QT Interval:  455 QTC Calculation: 463 R Axis:   -84  Text Interpretation: Sinus rhythm Atrial premature complex Prolonged PR interval RBBB and LAFB Confirmed by Lowery Rue [phone]) on 08/25/2023 12:31:34 PM  Radiology CT Head Wo Contrast Result Date: 08/25/2023 CLINICAL DATA:  Headache, neuro deficit EXAM: CT HEAD WITHOUT CONTRAST TECHNIQUE: Contiguous axial images were obtained from the base of the skull through the vertex without intravenous contrast. RADIATION DOSE REDUCTION: This exam was performed according to the departmental dose-optimization program which includes automated exposure control, adjustment of the mA and/or kV according to patient size and/or use of iterative reconstruction technique. COMPARISON:  MRI 06/06/2022 FINDINGS: Brain: Normal anatomic configuration. Parenchymal volume loss is commensurate with the patient's age. Mild periventricular white matter changes are present likely reflecting the sequela of small vessel ischemia. Multiple lacunar infarcts are again identified within the cerebellar hemispheres bilaterally. No abnormal intra or extra-axial mass lesion or fluid collection. No abnormal mass effect or midline shift. No evidence of acute intracranial hemorrhage or infarct. Ventricular size is normal. Cerebellum unremarkable. Vascular: No asymmetric hyperdense  vasculature at the skull base. Skull: Intact Sinuses/Orbits: Paranasal sinuses are clear. Orbits are unremarkable. Other: Mastoid air cells and middle ear cavities are clear. IMPRESSION: 1. No evidence of acute intracranial hemorrhage or infarct. 2. Mild periventricular white matter changes likely reflecting the sequela of small vessel ischemia. 3. Multiple lacunar infarcts again identified within the cerebellar hemispheres bilaterally. Electronically Signed   By: Worthy Heads M.D.   On: 08/25/2023 12:37    Procedures Procedures    Medications Ordered in ED Medications  meclizine (ANTIVERT) tablet 25 mg (25 mg Oral Given 08/25/23 1247)    ED Course/ Medical Decision Making/ A&P                                 Medical Decision Making Amount and/or Complexity of Data Reviewed Labs: ordered. Radiology: ordered.   Malosi Crear is here with Cornel Diesel for the last 3 days on and off force of movement.  History of hypertension diabetes stroke CKD CHF.  No chest pain no shortness of breath.  History of vertigo as well.  Feels like maybe his prior vertigo.  Has ears cleaned out recently.  Denies any weakness numbness speech changes vision loss.  Symptoms are not fine at rest.  Worse when he moves.  He had normal neurological exam with me.  No nystagmus.  He is able to ambulate in the room fairly well.  Overall I have low suspicion for stroke.  He is ears look unremarkable on exam.  I do suspect likely peripheral vertigo.  He has EKG that shows sinus rhythm.  No ischemic changes.  Doubt cardiac or pulmonary process.  Patient with no fever normal vitals.  Will get CBC BMP CT head and give meclizine and reevaluate.  Per my review interpretation of labs no significant leukocytosis anemia or electrolyte abnormality.  CT of the head is unremarkable.  After meclizine he is able to ambulate to the bathroom without any assistance.  He is feeling much better.  We did  shared decision given his history of stroke  about may be sending him for an MRI today to further evaluate for stroke.  Given his improvement shared decision was made to hold off on further stroke workup or other workup.  Plan will be for meclizine PCP follow-up and told to return if symptoms worsen.  Discharged in good condition.  At this time I do suspect a peripheral vertigo he understands return precautions.  This chart was dictated using voice recognition software.  Despite best efforts to proofread,  errors can occur which can change the documentation meaning.         Final Clinical Impression(s) / ED Diagnoses Final diagnoses:  Dizziness    Rx / DC Orders ED Discharge Orders          Ordered    meclizine (ANTIVERT) 25 MG tablet  2 times daily PRN        08/25/23 1345              Alger Kerstein, DO 08/25/23 1346

## 2023-08-25 NOTE — Discharge Instructions (Addendum)
 Take meclizine as needed for dizziness.  Please return if symptoms worsen as we discussed to further evaluate for possible stroke.  My suspicion is that this is a peripheral vertigo and should improve with time.  Please return for any other worsening symptoms as well.

## 2023-08-25 NOTE — ED Notes (Signed)
 Pt given discharge instructions and reviewed prescriptions. Opportunities given for questions. Pt verbalizes understanding. PIV removed x1. Jillyn Hidden, RN

## 2023-08-25 NOTE — ED Triage Notes (Signed)
 Severe dizziness, right ear feels stopped up, just had ears cleaned 2 weeks ago. Has hx vertigo but feels different

## 2023-08-25 NOTE — Plan of Care (Signed)
  These are curbside recommendations based upon the information readily available in the chart on brief review as well as history and examination information provided to me by requesting provider and do not replace a full detailed consult  88 year old patient presenting with dizziness ongoing for the past 3 days per ED provider neurological exam benign other than some unsteadiness with his gait.  I am asked for imaging recommendations given his history of CKD.  As the symptoms have been going ongoing for 3 days MRI brain would be expected to be fairly sensitive for acute stroke, would be reasonable to also obtain MRA head due to history of right vert occlusion vs. Severe stenosis seen on prior MRA  Baldwin Levee MD-PhD [facility] [phone] Available 8 AM to 8 PM, outside these hours please contact Neurologist on call listed on AMION

## 2023-08-30 ENCOUNTER — Other Ambulatory Visit: Payer: Self-pay | Admitting: Family Medicine

## 2023-08-30 DIAGNOSIS — Z8673 Personal history of transient ischemic attack (TIA), and cerebral infarction without residual deficits: Secondary | ICD-10-CM

## 2023-08-30 DIAGNOSIS — C9000 Multiple myeloma not having achieved remission: Secondary | ICD-10-CM

## 2023-08-30 DIAGNOSIS — N184 Chronic kidney disease, stage 4 (severe): Secondary | ICD-10-CM

## 2023-08-30 DIAGNOSIS — R42 Dizziness and giddiness: Secondary | ICD-10-CM

## 2023-08-31 ENCOUNTER — Ambulatory Visit: Payer: Medicare Other | Admitting: Podiatry

## 2023-09-02 ENCOUNTER — Emergency Department (HOSPITAL_COMMUNITY)

## 2023-09-02 ENCOUNTER — Other Ambulatory Visit: Payer: Self-pay

## 2023-09-02 ENCOUNTER — Encounter (HOSPITAL_COMMUNITY): Payer: Self-pay

## 2023-09-02 ENCOUNTER — Inpatient Hospital Stay (HOSPITAL_COMMUNITY)
Admission: EM | Admit: 2023-09-02 | Discharge: 2023-09-05 | DRG: 065 | Disposition: A | Discharge status: Rehabilitation Facility | Attending: Internal Medicine | Admitting: Internal Medicine

## 2023-09-02 ENCOUNTER — Ambulatory Visit
Admission: RE | Admit: 2023-09-02 | Discharge: 2023-09-02 | Disposition: A | Discharge status: Home / Self Care | Source: Ambulatory Visit | Attending: Family Medicine | Admitting: Family Medicine

## 2023-09-02 DIAGNOSIS — C9000 Multiple myeloma not having achieved remission: Secondary | ICD-10-CM

## 2023-09-02 DIAGNOSIS — E1169 Type 2 diabetes mellitus with other specified complication: Secondary | ICD-10-CM | POA: Diagnosis not present

## 2023-09-02 DIAGNOSIS — F32A Depression, unspecified: Secondary | ICD-10-CM | POA: Diagnosis not present

## 2023-09-02 DIAGNOSIS — M109 Gout, unspecified: Secondary | ICD-10-CM | POA: Diagnosis not present

## 2023-09-02 DIAGNOSIS — Y92099 Unspecified place in other non-institutional residence as the place of occurrence of the external cause: Secondary | ICD-10-CM | POA: Diagnosis not present

## 2023-09-02 DIAGNOSIS — E669 Obesity, unspecified: Secondary | ICD-10-CM | POA: Diagnosis not present

## 2023-09-02 DIAGNOSIS — N184 Chronic kidney disease, stage 4 (severe): Secondary | ICD-10-CM | POA: Diagnosis present

## 2023-09-02 DIAGNOSIS — Y92009 Unspecified place in unspecified non-institutional (private) residence as the place of occurrence of the external cause: Secondary | ICD-10-CM

## 2023-09-02 DIAGNOSIS — G4733 Obstructive sleep apnea (adult) (pediatric): Secondary | ICD-10-CM | POA: Diagnosis not present

## 2023-09-02 DIAGNOSIS — W19XXXA Unspecified fall, initial encounter: Secondary | ICD-10-CM | POA: Diagnosis not present

## 2023-09-02 DIAGNOSIS — Z8673 Personal history of transient ischemic attack (TIA), and cerebral infarction without residual deficits: Secondary | ICD-10-CM

## 2023-09-02 DIAGNOSIS — I429 Cardiomyopathy, unspecified: Secondary | ICD-10-CM | POA: Diagnosis not present

## 2023-09-02 DIAGNOSIS — R5383 Other fatigue: Secondary | ICD-10-CM | POA: Diagnosis not present

## 2023-09-02 DIAGNOSIS — I5042 Chronic combined systolic (congestive) and diastolic (congestive) heart failure: Secondary | ICD-10-CM | POA: Diagnosis present

## 2023-09-02 DIAGNOSIS — Z881 Allergy status to other antibiotic agents status: Secondary | ICD-10-CM

## 2023-09-02 DIAGNOSIS — E1165 Type 2 diabetes mellitus with hyperglycemia: Secondary | ICD-10-CM | POA: Diagnosis not present

## 2023-09-02 DIAGNOSIS — K219 Gastro-esophageal reflux disease without esophagitis: Secondary | ICD-10-CM | POA: Diagnosis present

## 2023-09-02 DIAGNOSIS — T8384XA Pain from genitourinary prosthetic devices, implants and grafts, initial encounter: Secondary | ICD-10-CM | POA: Diagnosis not present

## 2023-09-02 DIAGNOSIS — R238 Other skin changes: Secondary | ICD-10-CM | POA: Diagnosis not present

## 2023-09-02 DIAGNOSIS — W19XXXD Unspecified fall, subsequent encounter: Secondary | ICD-10-CM | POA: Diagnosis present

## 2023-09-02 DIAGNOSIS — I509 Heart failure, unspecified: Secondary | ICD-10-CM | POA: Diagnosis not present

## 2023-09-02 DIAGNOSIS — Y92238 Other place in hospital as the place of occurrence of the external cause: Secondary | ICD-10-CM | POA: Diagnosis not present

## 2023-09-02 DIAGNOSIS — R601 Generalized edema: Secondary | ICD-10-CM | POA: Diagnosis not present

## 2023-09-02 DIAGNOSIS — Z7982 Long term (current) use of aspirin: Secondary | ICD-10-CM | POA: Diagnosis not present

## 2023-09-02 DIAGNOSIS — D539 Nutritional anemia, unspecified: Secondary | ICD-10-CM | POA: Diagnosis not present

## 2023-09-02 DIAGNOSIS — I5043 Acute on chronic combined systolic (congestive) and diastolic (congestive) heart failure: Secondary | ICD-10-CM | POA: Diagnosis not present

## 2023-09-02 DIAGNOSIS — E1122 Type 2 diabetes mellitus with diabetic chronic kidney disease: Secondary | ICD-10-CM | POA: Diagnosis not present

## 2023-09-02 DIAGNOSIS — B9689 Other specified bacterial agents as the cause of diseases classified elsewhere: Secondary | ICD-10-CM | POA: Diagnosis not present

## 2023-09-02 DIAGNOSIS — I6623 Occlusion and stenosis of bilateral posterior cerebral arteries: Secondary | ICD-10-CM | POA: Diagnosis not present

## 2023-09-02 DIAGNOSIS — M25521 Pain in right elbow: Secondary | ICD-10-CM | POA: Diagnosis not present

## 2023-09-02 DIAGNOSIS — N1832 Chronic kidney disease, stage 3b: Secondary | ICD-10-CM | POA: Diagnosis not present

## 2023-09-02 DIAGNOSIS — E876 Hypokalemia: Secondary | ICD-10-CM | POA: Diagnosis not present

## 2023-09-02 DIAGNOSIS — R338 Other retention of urine: Secondary | ICD-10-CM | POA: Diagnosis not present

## 2023-09-02 DIAGNOSIS — Z7984 Long term (current) use of oral hypoglycemic drugs: Secondary | ICD-10-CM | POA: Diagnosis not present

## 2023-09-02 DIAGNOSIS — N401 Enlarged prostate with lower urinary tract symptoms: Secondary | ICD-10-CM | POA: Diagnosis not present

## 2023-09-02 DIAGNOSIS — I130 Hypertensive heart and chronic kidney disease with heart failure and stage 1 through stage 4 chronic kidney disease, or unspecified chronic kidney disease: Secondary | ICD-10-CM | POA: Diagnosis present

## 2023-09-02 DIAGNOSIS — E785 Hyperlipidemia, unspecified: Secondary | ICD-10-CM | POA: Diagnosis present

## 2023-09-02 DIAGNOSIS — I69398 Other sequelae of cerebral infarction: Secondary | ICD-10-CM | POA: Diagnosis not present

## 2023-09-02 DIAGNOSIS — R42 Dizziness and giddiness: Secondary | ICD-10-CM

## 2023-09-02 DIAGNOSIS — I998 Other disorder of circulatory system: Secondary | ICD-10-CM | POA: Diagnosis not present

## 2023-09-02 DIAGNOSIS — R29701 NIHSS score 1: Secondary | ICD-10-CM | POA: Diagnosis present

## 2023-09-02 DIAGNOSIS — Z79899 Other long term (current) drug therapy: Secondary | ICD-10-CM

## 2023-09-02 DIAGNOSIS — W1830XA Fall on same level, unspecified, initial encounter: Secondary | ICD-10-CM | POA: Diagnosis present

## 2023-09-02 DIAGNOSIS — I10 Essential (primary) hypertension: Secondary | ICD-10-CM | POA: Diagnosis not present

## 2023-09-02 DIAGNOSIS — Z6833 Body mass index (BMI) 33.0-33.9, adult: Secondary | ICD-10-CM | POA: Diagnosis not present

## 2023-09-02 DIAGNOSIS — Z794 Long term (current) use of insulin: Secondary | ICD-10-CM | POA: Diagnosis not present

## 2023-09-02 DIAGNOSIS — I639 Cerebral infarction, unspecified: Principal | ICD-10-CM | POA: Diagnosis present

## 2023-09-02 DIAGNOSIS — N390 Urinary tract infection, site not specified: Secondary | ICD-10-CM | POA: Diagnosis present

## 2023-09-02 DIAGNOSIS — G319 Degenerative disease of nervous system, unspecified: Secondary | ICD-10-CM | POA: Diagnosis not present

## 2023-09-02 DIAGNOSIS — I428 Other cardiomyopathies: Secondary | ICD-10-CM | POA: Diagnosis not present

## 2023-09-02 DIAGNOSIS — M7989 Other specified soft tissue disorders: Secondary | ICD-10-CM | POA: Diagnosis not present

## 2023-09-02 DIAGNOSIS — I63543 Cerebral infarction due to unspecified occlusion or stenosis of bilateral cerebellar arteries: Secondary | ICD-10-CM | POA: Diagnosis not present

## 2023-09-02 DIAGNOSIS — R471 Dysarthria and anarthria: Secondary | ICD-10-CM | POA: Diagnosis present

## 2023-09-02 DIAGNOSIS — I2510 Atherosclerotic heart disease of native coronary artery without angina pectoris: Secondary | ICD-10-CM | POA: Diagnosis not present

## 2023-09-02 DIAGNOSIS — M25531 Pain in right wrist: Secondary | ICD-10-CM | POA: Diagnosis not present

## 2023-09-02 DIAGNOSIS — Z923 Personal history of irradiation: Secondary | ICD-10-CM

## 2023-09-02 DIAGNOSIS — G939 Disorder of brain, unspecified: Secondary | ICD-10-CM | POA: Diagnosis not present

## 2023-09-02 DIAGNOSIS — Z882 Allergy status to sulfonamides status: Secondary | ICD-10-CM

## 2023-09-02 DIAGNOSIS — I6503 Occlusion and stenosis of bilateral vertebral arteries: Secondary | ICD-10-CM | POA: Diagnosis not present

## 2023-09-02 DIAGNOSIS — Y846 Urinary catheterization as the cause of abnormal reaction of the patient, or of later complication, without mention of misadventure at the time of the procedure: Secondary | ICD-10-CM | POA: Diagnosis not present

## 2023-09-02 DIAGNOSIS — K5900 Constipation, unspecified: Secondary | ICD-10-CM | POA: Diagnosis not present

## 2023-09-02 DIAGNOSIS — I69312 Visuospatial deficit and spatial neglect following cerebral infarction: Secondary | ICD-10-CM | POA: Diagnosis not present

## 2023-09-02 DIAGNOSIS — Z66 Do not resuscitate: Secondary | ICD-10-CM | POA: Diagnosis present

## 2023-09-02 DIAGNOSIS — M19021 Primary osteoarthritis, right elbow: Secondary | ICD-10-CM | POA: Diagnosis not present

## 2023-09-02 DIAGNOSIS — I651 Occlusion and stenosis of basilar artery: Secondary | ICD-10-CM | POA: Diagnosis not present

## 2023-09-02 DIAGNOSIS — H814 Vertigo of central origin: Secondary | ICD-10-CM

## 2023-09-02 DIAGNOSIS — I6389 Other cerebral infarction: Secondary | ICD-10-CM | POA: Diagnosis not present

## 2023-09-02 DIAGNOSIS — I453 Trifascicular block: Secondary | ICD-10-CM | POA: Diagnosis not present

## 2023-09-02 DIAGNOSIS — I63533 Cerebral infarction due to unspecified occlusion or stenosis of bilateral posterior cerebral arteries: Secondary | ICD-10-CM

## 2023-09-02 DIAGNOSIS — I69319 Unspecified symptoms and signs involving cognitive functions following cerebral infarction: Secondary | ICD-10-CM | POA: Diagnosis not present

## 2023-09-02 DIAGNOSIS — I69322 Dysarthria following cerebral infarction: Secondary | ICD-10-CM | POA: Diagnosis not present

## 2023-09-02 DIAGNOSIS — I452 Bifascicular block: Secondary | ICD-10-CM | POA: Diagnosis not present

## 2023-09-02 DIAGNOSIS — Z8249 Family history of ischemic heart disease and other diseases of the circulatory system: Secondary | ICD-10-CM

## 2023-09-02 DIAGNOSIS — B961 Klebsiella pneumoniae [K. pneumoniae] as the cause of diseases classified elsewhere: Secondary | ICD-10-CM | POA: Diagnosis not present

## 2023-09-02 DIAGNOSIS — R270 Ataxia, unspecified: Secondary | ICD-10-CM | POA: Diagnosis not present

## 2023-09-02 DIAGNOSIS — I6782 Cerebral ischemia: Secondary | ICD-10-CM | POA: Diagnosis not present

## 2023-09-02 DIAGNOSIS — N400 Enlarged prostate without lower urinary tract symptoms: Secondary | ICD-10-CM | POA: Diagnosis not present

## 2023-09-02 DIAGNOSIS — Z7902 Long term (current) use of antithrombotics/antiplatelets: Secondary | ICD-10-CM

## 2023-09-02 DIAGNOSIS — Z87891 Personal history of nicotine dependence: Secondary | ICD-10-CM

## 2023-09-02 DIAGNOSIS — E1142 Type 2 diabetes mellitus with diabetic polyneuropathy: Secondary | ICD-10-CM | POA: Diagnosis present

## 2023-09-02 DIAGNOSIS — Y738 Miscellaneous gastroenterology and urology devices associated with adverse incidents, not elsewhere classified: Secondary | ICD-10-CM | POA: Diagnosis not present

## 2023-09-02 DIAGNOSIS — Z888 Allergy status to other drugs, medicaments and biological substances status: Secondary | ICD-10-CM

## 2023-09-02 DIAGNOSIS — I6523 Occlusion and stenosis of bilateral carotid arteries: Secondary | ICD-10-CM | POA: Diagnosis not present

## 2023-09-02 DIAGNOSIS — R9089 Other abnormal findings on diagnostic imaging of central nervous system: Secondary | ICD-10-CM | POA: Diagnosis not present

## 2023-09-02 DIAGNOSIS — Z885 Allergy status to narcotic agent status: Secondary | ICD-10-CM

## 2023-09-02 DIAGNOSIS — I63013 Cerebral infarction due to thrombosis of bilateral vertebral arteries: Secondary | ICD-10-CM | POA: Diagnosis not present

## 2023-09-02 DIAGNOSIS — Z8546 Personal history of malignant neoplasm of prostate: Secondary | ICD-10-CM

## 2023-09-02 DIAGNOSIS — Z515 Encounter for palliative care: Secondary | ICD-10-CM | POA: Diagnosis not present

## 2023-09-02 LAB — CBC WITH DIFFERENTIAL/PLATELET
Abs Immature Granulocytes: 0.04 10*3/uL (ref 0.00–0.07)
Basophils Absolute: 0 10*3/uL (ref 0.0–0.1)
Basophils Relative: 0 %
Eosinophils Absolute: 0 10*3/uL (ref 0.0–0.5)
Eosinophils Relative: 0 %
HCT: 38.3 % — ABNORMAL LOW (ref 39.0–52.0)
Hemoglobin: 12.4 g/dL — ABNORMAL LOW (ref 13.0–17.0)
Immature Granulocytes: 1 %
Lymphocytes Relative: 10 %
Lymphs Abs: 0.8 10*3/uL (ref 0.7–4.0)
MCH: 32.9 pg (ref 26.0–34.0)
MCHC: 32.4 g/dL (ref 30.0–36.0)
MCV: 101.6 fL — ABNORMAL HIGH (ref 80.0–100.0)
Monocytes Absolute: 1.1 10*3/uL — ABNORMAL HIGH (ref 0.1–1.0)
Monocytes Relative: 13 %
Neutro Abs: 6 10*3/uL (ref 1.7–7.7)
Neutrophils Relative %: 76 %
Platelets: 153 10*3/uL (ref 150–400)
RBC: 3.77 MIL/uL — ABNORMAL LOW (ref 4.22–5.81)
RDW: 13.4 % (ref 11.5–15.5)
WBC: 7.9 10*3/uL (ref 4.0–10.5)
nRBC: 0 % (ref 0.0–0.2)

## 2023-09-02 LAB — COMPREHENSIVE METABOLIC PANEL WITH GFR
ALT: 12 U/L (ref 0–44)
AST: 18 U/L (ref 15–41)
Albumin: 3.6 g/dL (ref 3.5–5.0)
Alkaline Phosphatase: 51 U/L (ref 38–126)
Anion gap: 10 (ref 5–15)
BUN: 38 mg/dL — ABNORMAL HIGH (ref 8–23)
CO2: 30 mmol/L (ref 22–32)
Calcium: 9.7 mg/dL (ref 8.9–10.3)
Chloride: 98 mmol/L (ref 98–111)
Creatinine, Ser: 2.28 mg/dL — ABNORMAL HIGH (ref 0.61–1.24)
GFR, Estimated: 27 mL/min — ABNORMAL LOW (ref >60)
Glucose, Bld: 171 mg/dL — ABNORMAL HIGH (ref 70–99)
Potassium: 3.6 mmol/L (ref 3.5–5.1)
Sodium: 138 mmol/L (ref 135–145)
Total Bilirubin: 1.1 mg/dL (ref 0.0–1.2)
Total Protein: 7.6 g/dL (ref 6.5–8.1)

## 2023-09-02 LAB — URINALYSIS, ROUTINE W REFLEX MICROSCOPIC
Bilirubin Urine: NEGATIVE
Glucose, UA: NEGATIVE mg/dL
Ketones, ur: NEGATIVE mg/dL
Nitrite: POSITIVE — AB
Protein, ur: 100 mg/dL — AB
Specific Gravity, Urine: 1.009 (ref 1.005–1.030)
pH: 6 (ref 5.0–8.0)

## 2023-09-02 LAB — GLUCOSE, CAPILLARY: Glucose-Capillary: 180 mg/dL — ABNORMAL HIGH (ref 70–99)

## 2023-09-02 LAB — CBG MONITORING, ED: Glucose-Capillary: 151 mg/dL — ABNORMAL HIGH (ref 70–99)

## 2023-09-02 MED ORDER — VITAMIN D 25 MCG (1000 UNIT) PO TABS
1000.0000 [IU] | ORAL_TABLET | Freq: Every morning | ORAL | Status: DC
  Administered 2023-09-03 – 2023-09-05 (×3): 1000 [IU] via ORAL
  Filled 2023-09-02 (×3): qty 1

## 2023-09-02 MED ORDER — POTASSIUM CHLORIDE CRYS ER 20 MEQ PO TBCR
40.0000 meq | EXTENDED_RELEASE_TABLET | Freq: Every day | ORAL | Status: DC
  Administered 2023-09-03 – 2023-09-05 (×3): 40 meq via ORAL
  Filled 2023-09-02 (×3): qty 2

## 2023-09-02 MED ORDER — ACETAMINOPHEN 650 MG RE SUPP: 650.0000 mg | RECTAL | Status: DC | PRN

## 2023-09-02 MED ORDER — ASPIRIN 325 MG PO TABS
325.0000 mg | ORAL_TABLET | Freq: Every day | ORAL | Status: DC
  Administered 2023-09-02: 325 mg via ORAL
  Filled 2023-09-02: qty 1

## 2023-09-02 MED ORDER — ACETAMINOPHEN 160 MG/5ML PO SOLN: 650.0000 mg | ORAL | Status: DC | PRN

## 2023-09-02 MED ORDER — INSULIN ASPART 100 UNIT/ML IJ SOLN
0.0000 [IU] | Freq: Every day | INTRAMUSCULAR | Status: DC
Start: 2023-09-02 — End: 2023-09-05
  Filled 2023-09-02: qty 0.05

## 2023-09-02 MED ORDER — TAMSULOSIN HCL 0.4 MG PO CAPS
0.4000 mg | ORAL_CAPSULE | Freq: Two times a day (BID) | ORAL | Status: DC
  Administered 2023-09-02 – 2023-09-05 (×6): 0.4 mg via ORAL
  Filled 2023-09-02 (×6): qty 1

## 2023-09-02 MED ORDER — SODIUM CHLORIDE 0.9 % IV SOLN
2.0000 g | INTRAVENOUS | Status: DC
  Administered 2023-09-02 – 2023-09-03 (×2): 2 g via INTRAVENOUS
  Filled 2023-09-02 (×3): qty 20

## 2023-09-02 MED ORDER — INSULIN ASPART 100 UNIT/ML IJ SOLN
0.0000 [IU] | Freq: Three times a day (TID) | INTRAMUSCULAR | Status: DC
  Administered 2023-09-03 (×2): 3 [IU] via SUBCUTANEOUS
  Administered 2023-09-03 – 2023-09-04 (×2): 2 [IU] via SUBCUTANEOUS
  Administered 2023-09-04 (×2): 5 [IU] via SUBCUTANEOUS
  Administered 2023-09-05 (×2): 3 [IU] via SUBCUTANEOUS
  Administered 2023-09-05: 2 [IU] via SUBCUTANEOUS
  Filled 2023-09-02: qty 0.15

## 2023-09-02 MED ORDER — IOHEXOL 350 MG/ML SOLN
60.0000 mL | Freq: Once | INTRAVENOUS | Status: AC | PRN
  Administered 2023-09-02: 60 mL via INTRAVENOUS

## 2023-09-02 MED ORDER — VITAMIN B-12 1000 MCG PO TABS
1000.0000 ug | ORAL_TABLET | Freq: Every morning | ORAL | Status: DC
  Administered 2023-09-03 – 2023-09-05 (×3): 1000 ug via ORAL
  Filled 2023-09-02 (×3): qty 1

## 2023-09-02 MED ORDER — CLOPIDOGREL BISULFATE 300 MG PO TABS
300.0000 mg | ORAL_TABLET | Freq: Once | ORAL | Status: AC
  Administered 2023-09-02: 300 mg via ORAL
  Filled 2023-09-02: qty 1

## 2023-09-02 MED ORDER — ENOXAPARIN SODIUM 30 MG/0.3ML IJ SOSY
30.0000 mg | PREFILLED_SYRINGE | INTRAMUSCULAR | Status: DC
  Administered 2023-09-02 – 2023-09-04 (×3): 30 mg via SUBCUTANEOUS
  Filled 2023-09-02 (×3): qty 0.3

## 2023-09-02 MED ORDER — SENNOSIDES-DOCUSATE SODIUM 8.6-50 MG PO TABS: 1.0000 | ORAL_TABLET | Freq: Every evening | ORAL | Status: DC | PRN

## 2023-09-02 MED ORDER — TORSEMIDE 20 MG PO TABS
60.0000 mg | ORAL_TABLET | Freq: Two times a day (BID) | ORAL | Status: DC
Start: 2023-09-02 — End: 2023-09-05
  Administered 2023-09-02 – 2023-09-05 (×7): 60 mg via ORAL
  Filled 2023-09-02 (×7): qty 3

## 2023-09-02 MED ORDER — STROKE: EARLY STAGES OF RECOVERY BOOK
Freq: Once | Status: AC
  Filled 2023-09-02: qty 1

## 2023-09-02 MED ORDER — ACETAMINOPHEN 325 MG PO TABS
650.0000 mg | ORAL_TABLET | ORAL | Status: DC | PRN
  Administered 2023-09-02: 650 mg via ORAL
  Filled 2023-09-02 (×2): qty 2

## 2023-09-02 NOTE — ED Notes (Signed)
 X-ray at bedside

## 2023-09-02 NOTE — ED Triage Notes (Signed)
 Pt states that he has been dizzy x 2 weeks. Pt reports falling 2 days ago hitting the front of his head and falling onto his right wrist. Right wrist is swollen.

## 2023-09-02 NOTE — Progress Notes (Signed)
 Radiologist Dr. Ethelle Herb approve giving the patient IV contrast. Dr. Florie Husband EDP stated he spoke with Neurologist and they want to evaluate the vessels. Patient given reduced dose

## 2023-09-02 NOTE — H&P (Addendum)
 History and Physical  Rhythm Alatorre ZOX:[id number] DOB: [birth year] DOA: 09/02/2023  PCP: Lanae Pinal, MD   Chief Complaint: Dizziness, fall  HPI: Allen Dyer is a 88 y.o. male with medical history significant for chronic combined systolic and diastolic HF, nonischemic cardiomyopathy, CVA, prostate cancer s/p XRT, BPH, GERD, gout, multiple myeloma, nonobstructive CAD, HLD, HTN, T2DM, peripheral neuropathy and CKD stage IV who presented to the ED for evaluation of dizziness and a fall. Patient reports he has had dizziness for the past 2.5 weeks. He describes dizziness as both lightheadedness and spinning sensation. He presented to the ED on 5/3 and workup with labs, EKG and CT head were unremarkable. Patient was discharged home with meclizine  due to concern for peripheral vertigo. Reports his symptoms did not improve with the meclizine . Two days ago, while sitting on the side of his bed, the room started spinning and he fell off the bed hitting the front of his head on the floor. He braced himself with his right hand during the fall. He reports poor coordination and right wrist pain and swelling but denies any vision changes, headaches, focal weakness, numbness, tingling, chest pain, shortness of breath, palpitations, nausea, vomiting or abdominal pain. States he finished treatment for UTI about a week ago and has not had any further dysuria but does continue to have foul odor to his urine.  ED Course: Initial vitals showed temp 99.6, RR 19, HR 85, BP 164/94, SpO2 96% on room air.  Labs significant for CBG 151, WBC 7.9, Hgb 12.4, platelet 153, sodium 138, K+ 3.7, creatinine 2.28, UA shows moderate hemoglobinuria, mild proteinuria, positive nitrate and large leukocytes. EKG shows sinus rhythm with prolonged PR interval, LVH, RBBB and LAFB.  Brain imaging shows patchy early subacute ischemic infarcts involving the right greater than the left cerebellar hemisphere. Neurology was consulted for evaluation and  recommended admission at [facility] for stroke workup. Patient received Plavix  20 mg x 1, aspirin  325 mg x 1 and IV Rocephin. TRH was consulted for admission.   Review of Systems: Please see HPI for pertinent positives and negatives. A complete 10 system review of systems are otherwise negative.  Past Medical History:  Diagnosis Date   Chronic combined systolic and diastolic CHF (congestive heart failure) (HCC)    CKD (chronic kidney disease), stage III (HCC)    Hyperlipidemia    Hypertension    Lower extremity edema    Mild CAD    a. mild-mod by cath 05/2018.   Multiple myeloma (HCC) 11/15/2018   NICM (nonischemic cardiomyopathy) (HCC)    Peripheral neuropathy    Prostate cancer (HCC)    Status post XRT   Rheumatic fever    Stroke [facility])    Trifascicular block    Type 2 diabetes mellitus (HCC)    Vertigo    Past Surgical History:  Procedure Laterality Date   RIGHT HEART CATH N/A 07/10/2018   Procedure: RIGHT HEART CATH;  Surgeon: Darlis Eisenmenger, MD;  Location: [facility] INVASIVE CV LAB;  Service: Cardiovascular;  Laterality: N/A;   RIGHT/LEFT HEART CATH AND CORONARY ANGIOGRAPHY N/A 06/18/2018   Procedure: RIGHT/LEFT HEART CATH AND CORONARY ANGIOGRAPHY;  Surgeon: Millicent Ally, MD;  Location: [facility] INVASIVE CV LAB;  Service: Cardiovascular;  Laterality: N/A;   Social History:  reports that he quit smoking about 52 years ago. His smoking use included cigarettes. He started smoking about 62 years ago. He has a 20 pack-year smoking history. He has never used smokeless tobacco. He reports that he  does not currently use alcohol. He reports that he does not use drugs.  Allergies  Allergen Reactions   Tramadol Other (See Comments)    Dizziness (intolerance) hallucinate   Finasteride Swelling    Breast enlargement    Jardiance  [Empagliflozin ]     Yeast infection   Lisinopril Cough   Ciprofloxacin Other (See Comments)    Dizziness (intolerance)    Simvastatin Hives and Other (See Comments)     Blisters/ Rash    Sulfa Antibiotics Rash    Family History  Problem Relation Age of Onset   Cancer Mother    Congestive Heart Failure Father        died from heart failure at the age of 80   Cancer Sister    Congestive Heart Failure Brother        died from heart failure at the age of 70   Stroke Neg Hx      Prior to Admission medications   Medication Sig Start Date End Date Taking? Authorizing Provider  allopurinol  (ZYLOPRIM ) 100 MG tablet Take 50 mg by mouth every morning. 10/11/20   [provider]  Ascorbic Acid (VITAMIN C) 1000 MG tablet Take 4,000 mg by mouth every morning.    [provider]  aspirin  81 MG chewable tablet Chew 1 tablet (81 mg total) by mouth daily. 09/14/21   Singh, Prashant K, MD  Blood Glucose Monitoring Suppl (FREESTYLE LITE) w/Device KIT 1 Device by Does not apply route daily in the afternoon. 05/24/21   Shamleffer, Ibtehal Jaralla, MD  carvedilol  (COREG ) 6.25 MG tablet Take 1 tablet (6.25 mg total) by mouth 2 (two) times daily with a meal. 01/16/22   Darlis Eisenmenger, MD  Cholecalciferol (VITAMIN D3 PO) Take 1 capsule by mouth every morning.    [provider]  colchicine 0.6 MG tablet Take 0.6 mg by mouth daily as needed (gout).    [provider]  cyanocobalamin  1000 MCG tablet Take 1,000 mcg by mouth every morning.    [provider]  eplerenone  (INSPRA ) 50 MG tablet Take 1 tablet (50 mg total) by mouth daily. 06/23/21   Darlis Eisenmenger, MD  ezetimibe  (ZETIA ) 10 MG tablet Take 1 tablet (10 mg total) by mouth daily. 09/14/21   Singh, Prashant K, MD  gabapentin  (NEURONTIN ) 600 MG tablet Take 300 mg by mouth 2 (two) times daily. 04/28/20   [provider]  Garlic 1000 MG CAPS Take 1,000 mg by mouth every morning.    [provider]  glipiZIDE  (GLUCOTROL ) 5 MG tablet Take 5 mg by mouth 2 (two) times daily before a meal.    [provider]  glucose blood (FREESTYLE LITE) test strip 1 each by  Other route daily in the afternoon. Use as instructed 05/24/21   Shamleffer, Ibtehal Jaralla, MD  hydrALAZINE  (APRESOLINE ) 50 MG tablet Take 1 tablet (50 mg total) by mouth 3 (three) times daily. 6am, 2pm and 10pm 09/15/21   Singh, Prashant K, MD  isosorbide  mononitrate (IMDUR ) 30 MG 24 hr tablet Take 1 tablet (30 mg total) by mouth every morning. 09/14/21   Singh, Prashant K, MD  meclizine  (ANTIVERT ) 12.5 MG tablet Take 2 tablets (25 mg total) by mouth 2 (two) times daily as needed for dizziness. 08/25/23   Curatolo, Adam, DO  Omega-3 Fatty Acids (FISH OIL) 1200 MG CAPS Take 1,200 mg by mouth daily.    [provider]  Glynda Lash Calcium  500 MG TABS Take 500 mg by mouth daily.  [provider]  pantoprazole  (PROTONIX ) 40 MG tablet Take 1 tablet (40 mg total) by mouth daily. Patient not taking: Reported on 08/16/2023 09/14/21   Singh, Prashant K, MD  potassium chloride  SA (KLOR-CON  M) 20 MEQ tablet Take 2 tablets (40 mEq total) by mouth daily. 07/30/23   Arleene Belt, PA-C  tamsulosin  (FLOMAX ) 0.4 MG CAPS capsule Take 0.4 mg by mouth 2 (two) times daily.  03/24/17   [provider]  torsemide  (DEMADEX ) 20 MG tablet Take 3 tablets (60 mg total) by mouth 2 (two) times daily. 07/12/23   Arleene Belt, PA-C    Physical Exam: BP (!) 164/94   Pulse 85   Temp 99.6 F (37.6 C) (Oral)   Resp 19   Ht 5\' 7"  (1.702 m)   Wt 97.5 kg   SpO2 96%   BMI 33.67 kg/m  General: Pleasant, well-appearing elderly man laying in bed. No acute distress. HEENT: [place]/AT. Anicteric sclera. PERRLA. EOMI. CV: RRR. No murmurs, rubs, or gallops. Trace ankle edema Pulmonary: Lungs CTAB. Normal effort. No wheezing or rales. Abdominal: Soft, nontender, nondistended. Normal bowel sounds. Extremities: Palpable radial and DP pulses. Tender and swollen right wrist with decreased ROM.NVI distally.  Skin: Warm and dry. No obvious rash or lesions. Neuro: A&Ox3. Speech is clear and  comprehensible. No facial asymmetry. Moves all extremities. Normal finger-to-nose with some difficulty on the right due to right wrist pain. Heel-to-shin slightly slower on the left. Normal sensation to light touch.  Psych: Normal mood and affect          Labs on Admission:  Basic Metabolic Panel: Recent Labs  Lab 09/02/23 1642  NA 138  K 3.6  CL 98  CO2 30  GLUCOSE 171*  BUN 38*  CREATININE 2.28*  CALCIUM  9.7   Liver Function Tests: Recent Labs  Lab 09/02/23 1642  AST 18  ALT 12  ALKPHOS 51  BILITOT 1.1  PROT 7.6  ALBUMIN  3.6   No results for input(s): "LIPASE", "AMYLASE" in the last 168 hours. No results for input(s): "AMMONIA" in the last 168 hours. CBC: Recent Labs  Lab 09/02/23 1642  WBC 7.9  NEUTROABS 6.0  HGB 12.4*  HCT 38.3*  MCV 101.6*  PLT 153   Cardiac Enzymes: No results for input(s): "CKTOTAL", "CKMB", "CKMBINDEX", "TROPONINI" in the last 168 hours. BNP (last 3 results) Recent Labs    07/20/23 1450 07/30/23 0938  BNP 108.9* 228.7*    ProBNP (last 3 results) No results for input(s): "PROBNP" in the last 8760 hours.  CBG: Recent Labs  Lab 09/02/23 1613  GLUCAP 151*    Radiological Exams on Admission: MR BRAIN WO CONTRAST Result Date: 09/02/2023 CLINICAL DATA:  Initial evaluation for acute dizziness and vertigo. EXAM: MRI HEAD WITHOUT CONTRAST TECHNIQUE: Multiplanar, multiecho pulse sequences of the brain and surrounding structures were obtained without intravenous contrast. COMPARISON:  CT from 08/25/2023. FINDINGS: Brain: Diffuse prominence of the CSF containing spaces compatible with generalized cerebral atrophy, moderately advanced in nature. Patchy T2/FLAIR hyperintensity involving the supratentorial cerebral white matter, consistent with chronic small vessel ischemic disease, relatively mild for age. Patchy restricted diffusion seen involving the right greater than left cerebellar hemispheres (series 6, image 6). Associated T2/FLAIR  signal abnormality with mild signal loss on ADC map, consistent with early subacute ischemic infarcts. Associated minimal petechial blood products at the right cerebellum without hemorrhagic transformation. No significant regional mass effect. These changes are superimposed on a few scattered underlying chronic cerebellar infarcts. No mass  lesion, midline shift or mass effect. No hydrocephalus or extra-axial fluid collection. Pituitary gland within normal limits. Vascular: Irregular flow voids within the V4 segments bilaterally, which could reflect slow flow and/or occlusion. Anterior circulation flow voids maintained. Skull and upper cervical spine: Degenerative osteoarthritic changes about the C1-2 articulations without significant stenosis. Bone marrow signal intensity within normal limits. No scalp soft tissue abnormality. Sinuses/Orbits: Prior bilateral ocular lens replacement. Paranasal sinuses are clear. No mastoid effusion. Other: None. IMPRESSION: 1. Patchy early subacute ischemic infarcts involving the right greater than left cerebellar hemispheres. Associated minimal petechial blood products at the right cerebellum without hemorrhagic transformation or significant regional mass effect. 2. Irregular flow voids within the V4 segments bilaterally, which could reflect slow flow and/or occlusion. Correlation with dedicated vascular imaging recommended. 3. Underlying moderately advanced cerebral atrophy with mild chronic small vessel ischemic disease. Electronically Signed   By: Virgia Griffins M.D.   On: 09/02/2023 18:12   CT Head Wo Contrast Result Date: 09/02/2023 CLINICAL DATA:  Fall 2 days ago, dizziness EXAM: CT HEAD WITHOUT CONTRAST CT CERVICAL SPINE WITHOUT CONTRAST TECHNIQUE: Multidetector CT imaging of the head and cervical spine was performed following the standard protocol without intravenous contrast. Multiplanar CT image reconstructions of the cervical spine were also generated. RADIATION  DOSE REDUCTION: This exam was performed according to the departmental dose-optimization program which includes automated exposure control, adjustment of the mA and/or kV according to patient size and/or use of iterative reconstruction technique. COMPARISON:  CT brain, 08/25/2023, same-day MR brain FINDINGS: CT HEAD FINDINGS Brain: Hypodense lesions in the cerebellar hemispheres, measuring 3.4 x 3.2 cm on the right (series 3, image 7) and 0.8 cm and 1.8 x 1.2 cm on the left (series 3, image 8). Vascular: No hyperdense vessel or unexpected calcification. Skull: Normal. Negative for fracture or focal lesion. Sinuses/Orbits: No acute finding. Other: None. CT CERVICAL SPINE FINDINGS Alignment: Straightening of the normal cervical lordosis. Skull base and vertebrae: No acute fracture. No primary bone lesion or focal pathologic process. Soft tissues and spinal canal: No prevertebral fluid or swelling. No visible canal hematoma. Disc levels: Mild-to-moderate multilevel cervical disc degenerative disease throughout the cervical spine. Upper chest: Negative. Other: None. IMPRESSION: 1. Hypodense lesions in the cerebellar hemispheres, measuring 3.4 x 3.2 cm on the right and 0.8 cm and 1.8 x 1.2 cm on the left. Please see forthcoming MR for further characterization. 2. No acute fracture or static subluxation of the cervical spine. 3. Mild-to-moderate multilevel cervical disc degenerative disease throughout the cervical spine. Electronically Signed   By: Fredricka Jenny M.D.   On: 09/02/2023 17:16   CT Cervical Spine Wo Contrast Result Date: 09/02/2023 CLINICAL DATA:  Fall 2 days ago, dizziness EXAM: CT HEAD WITHOUT CONTRAST CT CERVICAL SPINE WITHOUT CONTRAST TECHNIQUE: Multidetector CT imaging of the head and cervical spine was performed following the standard protocol without intravenous contrast. Multiplanar CT image reconstructions of the cervical spine were also generated. RADIATION DOSE REDUCTION: This exam was performed  according to the departmental dose-optimization program which includes automated exposure control, adjustment of the mA and/or kV according to patient size and/or use of iterative reconstruction technique. COMPARISON:  CT brain, 08/25/2023, same-day MR brain FINDINGS: CT HEAD FINDINGS Brain: Hypodense lesions in the cerebellar hemispheres, measuring 3.4 x 3.2 cm on the right (series 3, image 7) and 0.8 cm and 1.8 x 1.2 cm on the left (series 3, image 8). Vascular: No hyperdense vessel or unexpected calcification. Skull: Normal. Negative for fracture or focal  lesion. Sinuses/Orbits: No acute finding. Other: None. CT CERVICAL SPINE FINDINGS Alignment: Straightening of the normal cervical lordosis. Skull base and vertebrae: No acute fracture. No primary bone lesion or focal pathologic process. Soft tissues and spinal canal: No prevertebral fluid or swelling. No visible canal hematoma. Disc levels: Mild-to-moderate multilevel cervical disc degenerative disease throughout the cervical spine. Upper chest: Negative. Other: None. IMPRESSION: 1. Hypodense lesions in the cerebellar hemispheres, measuring 3.4 x 3.2 cm on the right and 0.8 cm and 1.8 x 1.2 cm on the left. Please see forthcoming MR for further characterization. 2. No acute fracture or static subluxation of the cervical spine. 3. Mild-to-moderate multilevel cervical disc degenerative disease throughout the cervical spine. Electronically Signed   By: Fredricka Jenny M.D.   On: 09/02/2023 17:16   Assessment/Plan Allen Dyer is a 88 y.o. male with medical history significant for  chronic combined systolic and diastolic HF, nonischemic cardiomyopathy, CVA, prostate cancer s/p XRT, BPH, GERD, gout, multiple myeloma, nonobstructive CAD, HLD, HTN, T2DM, peripheral neuropathy and CKD stage IV who presented to the ED for evaluation of dizziness and a fall and found to have cerebellar stroke.  # Subacute cerebellar stroke # Cerebrovascular accident # Vertebral  artery occlusion - Pt w/ hx of of posterior stroke presenting with 2-3 weeks of dizziness and disequilibrium - MRI brain shows patchy early subacute ischemic infarct involving the R>L cerebellar hemispheres. - CTA head and neck showing occlusion of the bilateral vertebral arteries, severe stenosis of the P2 segment of the right PCA and severe stenosis of the left supraclinoid ICA. - S/p Plavix  300 mg x1 and aspirin  325 mg x1 in the ED - [facility] - Neurology consulted, follow-up recs after their evaluation - Follow-up repeat echocardiogram - Permissive hypertension for now - Frequent neurochecks - PT/OT/SLP eval and treat - Passed bedside swallow screen, start CM diet - Follow-up A1c and lipid panel  # Fall # Right wrist pain/swelling - Reports falling and hitting head 2 days ago in the setting of dizziness/disequilibrium while sitting on his bed - No acute traumatic findings on brain or cervical spine imaging. - X-ray of the right wrist shows generalized subcutaneous edema but no fracture or subluxation - Apply ice pack to right wrist - Fall precautions  # UTI - Completed treatment about a week ago - Urine still with foul order, UA still showing signs of infection - Continue IV Rocephin - Follow-up urine culture  # HTN # CHF - Last TTE in May 2023 showed EF 35-40%, mild LVH, G1DD and no significant valvular pathology - Patient euvolemic on exam, SBP in the 150s to 170s. - Imaging findings show subacute infarcts so likely out of the window for permissive hypertension but will defer this to neurology - Hold BP meds for now -Continue torsemide  and potassium supplementation - Follow-up repeat TTE  # T2DM - A1c 7.1% 5 months ago - Blood glucose 171 on CMP - Follow-up repeat A1c - SSI with meals, CBG monitoring  # CKD stage IV - Kidney function is stable with no electrolyte abnormalities - Creatinine of 2.28 improved compared to creatinine over the  last year. - Continue torsemide  as above - Avoid nephrotoxic agents  # HLD - Follow-up lipid panel  # BPH - Continue tamsulosin   # OSA - CPAP at bedtime  # Per patient, has medications through the [place].  Daughter is bringing the rest of his meds.  Please resume the rest of his meds when  confirmed by pharmacy tech.  DVT prophylaxis: Lovenox      Code Status: Limited: Do not attempt resuscitation (DNR) -DNR-LIMITED -Do Not Intubate/DNI   Consults called: Neurology  Family Communication: No family at bedside  Severity of Illness: The appropriate patient status for this patient is INPATIENT. Inpatient status is judged to be reasonable and necessary in order to provide the required intensity of service to ensure the patient's safety. The patient's presenting symptoms, physical exam findings, and initial radiographic and laboratory data in the context of their chronic comorbidities is felt to place them at high risk for further clinical deterioration. Furthermore, it is not anticipated that the patient will be medically stable for discharge from the hospital within 2 midnights of admission.   * I certify that at the point of admission it is my clinical judgment that the patient will require inpatient hospital care spanning beyond 2 midnights from the point of admission due to high intensity of service, high risk for further deterioration and high frequency of surveillance required.*  Level of care:  Progressive  This record has been created using Conservation officer, historic buildings. Errors have been sought and corrected, but may not always be located. Such creation errors do not reflect on the standard of care.   Vita Grip, MD 09/02/2023, 6:08 PM [facility] Pager: [phone] Isaiah 41:10   If 7PM-7AM, please contact night-coverage [email or web address] Password [email or web address]

## 2023-09-02 NOTE — ED Provider Notes (Cosign Needed Addendum)
  EMERGENCY DEPARTMENT AT [facility] Provider Note   CSN: [id number] Arrival date & time: 09/02/23  1603     History  Chief Complaint  Patient presents with  . Fall  . Wrist Pain    Allen Dyer is a 88 y.o. male.   Fall  Wrist Pain  Patient is an 88 year old male presents the ED today with his grand child with concerns of dizziness x 3 weeks as well as a fall 2 days ago of which he is now complaining of right wrist pain, noting to have hit the front of his head on the ground.  Denies LOC, blood thinners, nausea, vomiting, vision changes. Previous medical history of HTN, CKD, multiple myeloma, type 2 diabetes, peripheral neuropathy, nonischemic cardiomyopathy, stroke, lower extremity edema, CHF, vertigo.  Noted to have been previously seen in the ED approximately 1 week ago of which she was prescribed meclizine  to help out with dizziness, stating that it still helps.  However he is afraid that he is having "muscular dystrophy" and came in today because he felt like his coordination has been worse recently.  Denies fever, chest pain, shortness of breath, abdominal pain, nausea, vomiting, dysuria.      Home Medications Prior to Admission medications   Medication Sig Start Date End Date Taking? Authorizing Provider  allopurinol  (ZYLOPRIM ) 100 MG tablet Take 50 mg by mouth every morning. 10/11/20   [provider]  Ascorbic Acid (VITAMIN C) 1000 MG tablet Take 4,000 mg by mouth every morning.    [provider]  aspirin  81 MG chewable tablet Chew 1 tablet (81 mg total) by mouth daily. 09/14/21   Singh, Prashant K, MD  Blood Glucose Monitoring Suppl (FREESTYLE LITE) w/Device KIT 1 Device by Does not apply route daily in the afternoon. 05/24/21   Shamleffer, Ibtehal Jaralla, MD  carvedilol  (COREG ) 6.25 MG tablet Take 1 tablet (6.25 mg total) by mouth 2 (two) times daily with a meal. 01/16/22   Darlis Eisenmenger, MD  Cholecalciferol (VITAMIN D3 PO)  Take 1 capsule by mouth every morning.    [provider]  colchicine 0.6 MG tablet Take 0.6 mg by mouth daily as needed (gout).    [provider]  cyanocobalamin  1000 MCG tablet Take 1,000 mcg by mouth every morning.    [provider]  eplerenone  (INSPRA ) 50 MG tablet Take 1 tablet (50 mg total) by mouth daily. 06/23/21   Darlis Eisenmenger, MD  ezetimibe  (ZETIA ) 10 MG tablet Take 1 tablet (10 mg total) by mouth daily. 09/14/21   Singh, Prashant K, MD  gabapentin  (NEURONTIN ) 600 MG tablet Take 300 mg by mouth 2 (two) times daily. 04/28/20   [provider]  Garlic 1000 MG CAPS Take 1,000 mg by mouth every morning.    [provider]  glipiZIDE  (GLUCOTROL ) 5 MG tablet Take 5 mg by mouth 2 (two) times daily before a meal.    [provider]  glucose blood (FREESTYLE LITE) test strip 1 each by Other route daily in the afternoon. Use as instructed 05/24/21   Shamleffer, Ibtehal Jaralla, MD  hydrALAZINE  (APRESOLINE ) 50 MG tablet Take 1 tablet (50 mg total) by mouth 3 (three) times daily. 6am, 2pm and 10pm 09/15/21   Singh, Prashant K, MD  isosorbide  mononitrate (IMDUR ) 30 MG 24 hr tablet Take 1 tablet (30 mg total) by mouth every morning. 09/14/21   Singh, Prashant K, MD  meclizine  (ANTIVERT ) 12.5 MG tablet Take 2 tablets (25 mg  total) by mouth 2 (two) times daily as needed for dizziness. 08/25/23   Curatolo, Adam, DO  Omega-3 Fatty Acids (FISH OIL) 1200 MG CAPS Take 1,200 mg by mouth daily.    [provider]  Glynda Lash Calcium  500 MG TABS Take 500 mg by mouth daily.    [provider]  pantoprazole  (PROTONIX ) 40 MG tablet Take 1 tablet (40 mg total) by mouth daily. Patient not taking: Reported on 08/16/2023 09/14/21   Singh, Prashant K, MD  potassium chloride  SA (KLOR-CON  M) 20 MEQ tablet Take 2 tablets (40 mEq total) by mouth daily. 07/30/23   Arleene Belt, PA-C  tamsulosin  (FLOMAX ) 0.4 MG CAPS capsule Take 0.4 mg by mouth 2 (two)  times daily.  03/24/17   [provider]  torsemide  (DEMADEX ) 20 MG tablet Take 3 tablets (60 mg total) by mouth 2 (two) times daily. 07/12/23   Arleene Belt, PA-C      Allergies    Tramadol, Finasteride, Jardiance  [empagliflozin ], Lisinopril, Ciprofloxacin, Simvastatin, and Sulfa antibiotics    Review of Systems   Review of Systems  Physical Exam Updated Vital Signs BP (!) 164/94   Pulse 85   Temp 99.6 F (37.6 C) (Oral)   Resp 19   Ht 5\' 7"  (1.702 m)   Wt 97.5 kg   SpO2 96%   BMI 33.67 kg/m  Physical Exam  ED Results / Procedures / Treatments   Labs (all labs ordered are listed, but only abnormal results are displayed) Labs Reviewed  CBC WITH DIFFERENTIAL/PLATELET - Abnormal; Notable for the following components:      Result Value   RBC 3.77 (*)    Hemoglobin 12.4 (*)    HCT 38.3 (*)    MCV 101.6 (*)    Monocytes Absolute 1.1 (*)    All other components within normal limits  COMPREHENSIVE METABOLIC PANEL WITH GFR - Abnormal; Notable for the following components:   Glucose, Bld 171 (*)    BUN 38 (*)    Creatinine, Ser 2.28 (*)    GFR, Estimated 27 (*)    All other components within normal limits  URINALYSIS, ROUTINE W REFLEX MICROSCOPIC - Abnormal; Notable for the following components:   APPearance CLOUDY (*)    Hgb urine dipstick MODERATE (*)    Protein, ur 100 (*)    Nitrite POSITIVE (*)    Leukocytes,Ua LARGE (*)    All other components within normal limits  CBG MONITORING, ED - Abnormal; Notable for the following components:   Glucose-Capillary 151 (*)    All other components within normal limits  URINE CULTURE    EKG EKG Interpretation Date/Time:  Sunday Sep 02 2023 16:16:27 EDT Ventricular Rate:  85 PR Interval:  214 QRS Duration:  168 QT Interval:  419 QTC Calculation: 499 R Axis:   270  Text Interpretation: Sinus rhythm Borderline prolonged PR interval RBBB and LAFB Left ventricular hypertrophy No significant change since last  tracing Confirmed by Zackowski, Scott [phone]) on 09/02/2023 4:19:24 PM  Radiology MR BRAIN WO CONTRAST Result Date: 09/02/2023 CLINICAL DATA:  Initial evaluation for acute dizziness and vertigo. EXAM: MRI HEAD WITHOUT CONTRAST TECHNIQUE: Multiplanar, multiecho pulse sequences of the brain and surrounding structures were obtained without intravenous contrast. COMPARISON:  CT from 08/25/2023. FINDINGS: Brain: Diffuse prominence of the CSF containing spaces compatible with generalized cerebral atrophy, moderately advanced in nature. Patchy T2/FLAIR hyperintensity involving the supratentorial cerebral white matter, consistent with chronic small vessel ischemic disease, relatively mild for age.  Patchy restricted diffusion seen involving the right greater than left cerebellar hemispheres (series 6, image 6). Associated T2/FLAIR signal abnormality with mild signal loss on ADC map, consistent with early subacute ischemic infarcts. Associated minimal petechial blood products at the right cerebellum without hemorrhagic transformation. No significant regional mass effect. These changes are superimposed on a few scattered underlying chronic cerebellar infarcts. No mass lesion, midline shift or mass effect. No hydrocephalus or extra-axial fluid collection. Pituitary gland within normal limits. Vascular: Irregular flow voids within the V4 segments bilaterally, which could reflect slow flow and/or occlusion. Anterior circulation flow voids maintained. Skull and upper cervical spine: Degenerative osteoarthritic changes about the C1-2 articulations without significant stenosis. Bone marrow signal intensity within normal limits. No scalp soft tissue abnormality. Sinuses/Orbits: Prior bilateral ocular lens replacement. Paranasal sinuses are clear. No mastoid effusion. Other: None. IMPRESSION: 1. Patchy early subacute ischemic infarcts involving the right greater than left cerebellar hemispheres. Associated minimal petechial blood  products at the right cerebellum without hemorrhagic transformation or significant regional mass effect. 2. Irregular flow voids within the V4 segments bilaterally, which could reflect slow flow and/or occlusion. Correlation with dedicated vascular imaging recommended. 3. Underlying moderately advanced cerebral atrophy with mild chronic small vessel ischemic disease. Electronically Signed   By: Virgia Griffins M.D.   On: 09/02/2023 18:12   CT Head Wo Contrast Result Date: 09/02/2023 CLINICAL DATA:  Fall 2 days ago, dizziness EXAM: CT HEAD WITHOUT CONTRAST CT CERVICAL SPINE WITHOUT CONTRAST TECHNIQUE: Multidetector CT imaging of the head and cervical spine was performed following the standard protocol without intravenous contrast. Multiplanar CT image reconstructions of the cervical spine were also generated. RADIATION DOSE REDUCTION: This exam was performed according to the departmental dose-optimization program which includes automated exposure control, adjustment of the mA and/or kV according to patient size and/or use of iterative reconstruction technique. COMPARISON:  CT brain, 08/25/2023, same-day MR brain FINDINGS: CT HEAD FINDINGS Brain: Hypodense lesions in the cerebellar hemispheres, measuring 3.4 x 3.2 cm on the right (series 3, image 7) and 0.8 cm and 1.8 x 1.2 cm on the left (series 3, image 8). Vascular: No hyperdense vessel or unexpected calcification. Skull: Normal. Negative for fracture or focal lesion. Sinuses/Orbits: No acute finding. Other: None. CT CERVICAL SPINE FINDINGS Alignment: Straightening of the normal cervical lordosis. Skull base and vertebrae: No acute fracture. No primary bone lesion or focal pathologic process. Soft tissues and spinal canal: No prevertebral fluid or swelling. No visible canal hematoma. Disc levels: Mild-to-moderate multilevel cervical disc degenerative disease throughout the cervical spine. Upper chest: Negative. Other: None. IMPRESSION: 1. Hypodense lesions  in the cerebellar hemispheres, measuring 3.4 x 3.2 cm on the right and 0.8 cm and 1.8 x 1.2 cm on the left. Please see forthcoming MR for further characterization. 2. No acute fracture or static subluxation of the cervical spine. 3. Mild-to-moderate multilevel cervical disc degenerative disease throughout the cervical spine. Electronically Signed   By: Fredricka Jenny M.D.   On: 09/02/2023 17:16   CT Cervical Spine Wo Contrast Result Date: 09/02/2023 CLINICAL DATA:  Fall 2 days ago, dizziness EXAM: CT HEAD WITHOUT CONTRAST CT CERVICAL SPINE WITHOUT CONTRAST TECHNIQUE: Multidetector CT imaging of the head and cervical spine was performed following the standard protocol without intravenous contrast. Multiplanar CT image reconstructions of the cervical spine were also generated. RADIATION DOSE REDUCTION: This exam was performed according to the departmental dose-optimization program which includes automated exposure control, adjustment of the mA and/or kV according to patient size and/or use  of iterative reconstruction technique. COMPARISON:  CT brain, 08/25/2023, same-day MR brain FINDINGS: CT HEAD FINDINGS Brain: Hypodense lesions in the cerebellar hemispheres, measuring 3.4 x 3.2 cm on the right (series 3, image 7) and 0.8 cm and 1.8 x 1.2 cm on the left (series 3, image 8). Vascular: No hyperdense vessel or unexpected calcification. Skull: Normal. Negative for fracture or focal lesion. Sinuses/Orbits: No acute finding. Other: None. CT CERVICAL SPINE FINDINGS Alignment: Straightening of the normal cervical lordosis. Skull base and vertebrae: No acute fracture. No primary bone lesion or focal pathologic process. Soft tissues and spinal canal: No prevertebral fluid or swelling. No visible canal hematoma. Disc levels: Mild-to-moderate multilevel cervical disc degenerative disease throughout the cervical spine. Upper chest: Negative. Other: None. IMPRESSION: 1. Hypodense lesions in the cerebellar hemispheres, measuring  3.4 x 3.2 cm on the right and 0.8 cm and 1.8 x 1.2 cm on the left. Please see forthcoming MR for further characterization. 2. No acute fracture or static subluxation of the cervical spine. 3. Mild-to-moderate multilevel cervical disc degenerative disease throughout the cervical spine. Electronically Signed   By: Fredricka Jenny M.D.   On: 09/02/2023 17:16    Procedures Procedures    Medications Ordered in ED Medications  cefTRIAXone (ROCEPHIN) 2 g in sodium chloride  0.9 % 100 mL IVPB (2 g Intravenous New Bag/Given 09/02/23 1749)  aspirin  tablet 325 mg (325 mg Oral Given 09/02/23 1757)  clopidogrel  (PLAVIX ) tablet 300 mg (300 mg Oral Given 09/02/23 1757)    ED Course/ Medical Decision Making/ A&P Clinical Course as of 09/02/23 2008  Sun Sep 02, 2023  2005 Spoke to radiology both vertebral arteries are occluded. Thrombus also noted, both PCAs are nearly occluded.  [CB]    Clinical Course User Index [CB] Hayes Lipps, PA-C                                 Medical Decision Making Amount and/or Complexity of Data Reviewed Labs: ordered. Radiology: ordered.  Risk Prescription drug management. Decision regarding hospitalization.   This patient is a 88 year old male who presents to the ED for concern of dizziness and worse coordination x 3 weeks.  Noted to have been using meclizine  with improvement but has not been consistent in taking it.  He is today complaining of right wrist pain after a fall which he suffered 2 days ago of which he did hit the front aspect of his forehead on the ground.  No urinary complaints.  Had recently finished course of antibiotics for UTI.  On physical exam, patient is in no acute distress, afebrile, alert and orient x 4, speaking in full sentences, nontachypneic, nontachycardic. Head is atraumatic, no midline tenderness of the cervical spine, thoracic spine, lumbar spine.  Moderate swelling and pain noted over the right wrist.  Cap refill normal and sensation  normal in hands bilaterally.  No trauma noted to the elbow or shoulder, knees, hips, ankles.  No abdominal tenderness to palpation.  No visual deficits, normal sensation in both legs bilaterally, normal flexion and extension of both upper and lower extremities 5+.  Normal grip strength.  No facial asymmetry.  No aphasia, apraxia, ataxia. Unremarkable exam otherwise.  CT scan reviewed and showed posterior stroke.  Spoke with Dr. Khaliqdina from neurology who wished to have patient undergo stat CT angio of head and neck as well as to be started on Plavix  and aspirin  and to be transferred to  [facility] for admission and will see the patient. Wishes to have the patient admitted to hospitalist.   Labs also showed UTI for which 2 g of Rocephin was started.  Hospitalist was called and patient care was transferred over, relating that he needed to be sent to [facility] for admission.  Differential diagnoses prior to evaluation: The emergent differential diagnosis includes, but is not limited to, CVA, metabolic symptoms, peripheral vertigo, central vertigo, mass, arrhythmia. This is not an exhaustive differential.   Past Medical History / Co-morbidities / Social History: HTN, CKD, multiple myeloma, type 2 diabetes, peripheral neuropathy, nonischemic cardiomyopathy, stroke, lower extremity edema, CHF, vertigo  Additional history: Chart reviewed. Pertinent results include:   Seen 08/25/2023 for dizziness in the ED.  Noted to have a history of vertigo and stroke and had been dizzy for 3 days which was intermittent but has been persistent at that time.  Worse with movement.  Suspecting peripheral vertigo at that time.  Provide discussed at that time possible MRI to evaluate further for stroke however patient declined at that time.  Follow-up MRI done today.  Lab Tests/Imaging studies: I personally interpreted labs/imaging and the pertinent results include:    CBC notes a anemia with a hemoglobin of 12.4 which is  increased from previous 8 days ago. CMP patient presents feeling presents been bleeding UA does show UTI with positive nitrates, leukocytes. Urine culture pending CT head shows hypodense lesions in the cerebral hemispheres. X-ray of right wrist shows edema with no acute fracture  CT angio of head and neck pending I agree with the radiologist interpretation.  Cardiac monitoring: EKG obtained and interpreted by myself and attending physician which shows: sinus rhythm.    EKG Interpretation Date/Time:  Sunday Sep 02 2023 16:16:27 EDT Ventricular Rate:  85 PR Interval:  214 QRS Duration:  168 QT Interval:  419 QTC Calculation: 499 R Axis:   270  Text Interpretation: Sinus rhythm Borderline prolonged PR interval RBBB and LAFB Left ventricular hypertrophy No significant change since last tracing Confirmed by Zackowski, Scott [phone]) on 09/02/2023 4:19:24 PM          Medications: I ordered medication including Rocephin, Plavix , aspirin ,.  I have reviewed the patients home medicines and have made adjustments as needed.  Critical Interventions:  Social Determinants of Health:  Disposition: After consideration of the diagnostic results and the patients response to treatment, I feel that the patient would benefit from admission, being followed by neurology.  Hospitalist, Dr. Darcia Easter, accepted care of patient at that time.    Final Clinical Impression(s) / ED Diagnoses Final diagnoses:  Cerebrovascular accident (CVA), unspecified mechanism (HCC)  Urinary tract infection without hematuria, site unspecified    Rx / DC Orders ED Discharge Orders     None         Hayes Lipps, PA-C 09/02/23 1906    Hayes Lipps, PA-C 09/02/23 1921    Afton Horse T, DO 09/06/23 [phone]

## 2023-09-02 NOTE — ED Notes (Signed)
 Patient transported to CT

## 2023-09-02 NOTE — ED Notes (Signed)
 Collin PA at bedside.

## 2023-09-03 ENCOUNTER — Inpatient Hospital Stay (HOSPITAL_COMMUNITY)

## 2023-09-03 DIAGNOSIS — E1122 Type 2 diabetes mellitus with diabetic chronic kidney disease: Secondary | ICD-10-CM

## 2023-09-03 DIAGNOSIS — I130 Hypertensive heart and chronic kidney disease with heart failure and stage 1 through stage 4 chronic kidney disease, or unspecified chronic kidney disease: Secondary | ICD-10-CM

## 2023-09-03 DIAGNOSIS — I63013 Cerebral infarction due to thrombosis of bilateral vertebral arteries: Secondary | ICD-10-CM

## 2023-09-03 DIAGNOSIS — N1832 Chronic kidney disease, stage 3b: Secondary | ICD-10-CM

## 2023-09-03 DIAGNOSIS — R29701 NIHSS score 1: Secondary | ICD-10-CM

## 2023-09-03 DIAGNOSIS — I6389 Other cerebral infarction: Secondary | ICD-10-CM | POA: Diagnosis not present

## 2023-09-03 DIAGNOSIS — Z7982 Long term (current) use of aspirin: Secondary | ICD-10-CM

## 2023-09-03 DIAGNOSIS — I998 Other disorder of circulatory system: Secondary | ICD-10-CM

## 2023-09-03 DIAGNOSIS — I429 Cardiomyopathy, unspecified: Secondary | ICD-10-CM

## 2023-09-03 DIAGNOSIS — I509 Heart failure, unspecified: Secondary | ICD-10-CM

## 2023-09-03 LAB — LIPID PANEL
Cholesterol: 122 mg/dL (ref 0–200)
HDL: 51 mg/dL (ref >40)
LDL Cholesterol: 57 mg/dL (ref 0–99)
Total CHOL/HDL Ratio: 2.4 ratio
Triglycerides: 69 mg/dL (ref <150)
VLDL: 14 mg/dL (ref 0–40)

## 2023-09-03 LAB — BASIC METABOLIC PANEL WITH GFR
Anion gap: 12 (ref 5–15)
BUN: 35 mg/dL — ABNORMAL HIGH (ref 8–23)
CO2: 28 mmol/L (ref 22–32)
Calcium: 9.6 mg/dL (ref 8.9–10.3)
Chloride: 102 mmol/L (ref 98–111)
Creatinine, Ser: 2.28 mg/dL — ABNORMAL HIGH (ref 0.61–1.24)
GFR, Estimated: 27 mL/min — ABNORMAL LOW (ref >60)
Glucose, Bld: 142 mg/dL — ABNORMAL HIGH (ref 70–99)
Potassium: 3.3 mmol/L — ABNORMAL LOW (ref 3.5–5.1)
Sodium: 142 mmol/L (ref 135–145)

## 2023-09-03 LAB — ECHOCARDIOGRAM COMPLETE
Area-P 1/2: 4.08 cm2
Calc EF: 30.8 %
Height: 67 in
P 1/2 time: 851 ms
S' Lateral: 4.5 cm
Single Plane A2C EF: 29.9 %
Single Plane A4C EF: 32.7 %
Weight: 3439.18 [oz_av]

## 2023-09-03 LAB — GLUCOSE, CAPILLARY
Glucose-Capillary: 152 mg/dL — ABNORMAL HIGH (ref 70–99)
Glucose-Capillary: 169 mg/dL — ABNORMAL HIGH (ref 70–99)
Glucose-Capillary: 141 mg/dL — ABNORMAL HIGH (ref 70–99)
Glucose-Capillary: 148 mg/dL — ABNORMAL HIGH (ref 70–99)

## 2023-09-03 LAB — CBC
HCT: 36.4 % — ABNORMAL LOW (ref 39.0–52.0)
Hemoglobin: 12 g/dL — ABNORMAL LOW (ref 13.0–17.0)
MCH: 32.9 pg (ref 26.0–34.0)
MCHC: 33 g/dL (ref 30.0–36.0)
MCV: 99.7 fL (ref 80.0–100.0)
Platelets: 159 10*3/uL (ref 150–400)
RBC: 3.65 MIL/uL — ABNORMAL LOW (ref 4.22–5.81)
RDW: 13.6 % (ref 11.5–15.5)
WBC: 7 10*3/uL (ref 4.0–10.5)
nRBC: 0 % (ref 0.0–0.2)

## 2023-09-03 LAB — HEMOGLOBIN A1C
Hgb A1c MFr Bld: 7 % — ABNORMAL HIGH (ref 4.8–5.6)
Mean Plasma Glucose: 154 mg/dL

## 2023-09-03 MED ORDER — PERFLUTREN LIPID MICROSPHERE
1.0000 mL | INTRAVENOUS | Status: AC | PRN
  Administered 2023-09-03: 3 mL via INTRAVENOUS

## 2023-09-03 MED ORDER — GLIPIZIDE 5 MG PO TABS
5.0000 mg | ORAL_TABLET | Freq: Every day | ORAL | Status: DC
  Administered 2023-09-04 – 2023-09-05 (×2): 5 mg via ORAL
  Filled 2023-09-03 (×3): qty 1

## 2023-09-03 MED ORDER — MECLIZINE HCL 25 MG PO TABS: 25.0000 mg | ORAL_TABLET | Freq: Two times a day (BID) | ORAL | Status: DC | PRN

## 2023-09-03 MED ORDER — ISOSORBIDE MONONITRATE ER 30 MG PO TB24
30.0000 mg | ORAL_TABLET | Freq: Every morning | ORAL | Status: DC
  Administered 2023-09-03 – 2023-09-05 (×3): 30 mg via ORAL
  Filled 2023-09-03 (×3): qty 1

## 2023-09-03 MED ORDER — GABAPENTIN 300 MG PO CAPS
300.0000 mg | ORAL_CAPSULE | Freq: Two times a day (BID) | ORAL | Status: DC
  Administered 2023-09-03 – 2023-09-05 (×5): 300 mg via ORAL
  Filled 2023-09-03 (×5): qty 1

## 2023-09-03 MED ORDER — ASPIRIN 81 MG PO TBEC
81.0000 mg | DELAYED_RELEASE_TABLET | Freq: Every day | ORAL | Status: DC
  Administered 2023-09-03 – 2023-09-05 (×3): 81 mg via ORAL
  Filled 2023-09-03 (×3): qty 1

## 2023-09-03 MED ORDER — CARVEDILOL 6.25 MG PO TABS
6.2500 mg | ORAL_TABLET | Freq: Two times a day (BID) | ORAL | Status: DC
  Administered 2023-09-03 – 2023-09-05 (×5): 6.25 mg via ORAL
  Filled 2023-09-03 (×5): qty 1

## 2023-09-03 MED ORDER — CLOPIDOGREL BISULFATE 75 MG PO TABS
75.0000 mg | ORAL_TABLET | Freq: Every day | ORAL | Status: DC
  Administered 2023-09-04 – 2023-09-05 (×2): 75 mg via ORAL
  Filled 2023-09-03 (×2): qty 1

## 2023-09-03 NOTE — Progress Notes (Signed)
 Orthopedic Tech Progress Note Patient Details:  Allen Dyer [birth year] [id number]  Ortho Devices Type of Ortho Device: Velcro wrist splint Ortho Device/Splint Location: RLE Ortho Device/Splint Interventions: Ordered, Other (comment)RN called me 3 times for a WRIST BRACE, told RN I was busy yet she called me 2 more times so I tube velcro brace when I fount time.   Post Interventions Patient Tolerated: Other (comment) Instructions Provided: Other (comment)  [name] 09/03/2023, 2:01 PM

## 2023-09-03 NOTE — Progress Notes (Addendum)
 STROKE TEAM PROGRESS NOTE    INTERIM HISTORY/SUBJECTIVE  Daughter is at the bedside. Patient is sitting up in bed in NAD. He states he feels better but does not feel like he is back to baseline. He states his speech still seems off and is slower than normal.   CBC    Component Value Date/Time   WBC 7.0 09/03/2023 0715   RBC 3.65 (L) 09/03/2023 0715   HGB 12.0 (L) 09/03/2023 0715   HGB 11.0 (L) 10/10/2018 1404   HGB 11.0 (L) 06/14/2018 1222   HCT 36.4 (L) 09/03/2023 0715   HCT 33.9 (L) 06/14/2018 1222   PLT 159 09/03/2023 0715   PLT 176 10/10/2018 1404   PLT 214 06/14/2018 1222   MCV 99.7 09/03/2023 0715   MCV 92 06/14/2018 1222   MCH 32.9 09/03/2023 0715   MCHC 33.0 09/03/2023 0715   RDW 13.6 09/03/2023 0715   RDW 14.7 06/14/2018 1222   LYMPHSABS 0.8 09/02/2023 1642   MONOABS 1.1 (H) 09/02/2023 1642   EOSABS 0.0 09/02/2023 1642   BASOSABS 0.0 09/02/2023 1642    BMET    Component Value Date/Time   NA 142 09/03/2023 0715   NA 143 07/01/2018 1233   K 3.3 (L) 09/03/2023 0715   CL 102 09/03/2023 0715   CO2 28 09/03/2023 0715   GLUCOSE 142 (H) 09/03/2023 0715   BUN 35 (H) 09/03/2023 0715   BUN 27 07/01/2018 1233   CREATININE 2.28 (H) 09/03/2023 0715   CREATININE 2.06 (H) 10/10/2018 1404   CALCIUM  9.6 09/03/2023 0715   GFRNONAA 27 (L) 09/03/2023 0715   GFRNONAA 29 (L) 10/10/2018 1404    IMAGING past 24 hours CT ANGIO HEAD NECK W WO CM Result Date: 09/02/2023 CLINICAL DATA:  Neuro deficit, concern for stroke/TIA. Dizziness for 2 weeks. Recent fall and hit front of head. EXAM: CT ANGIOGRAPHY HEAD AND NECK WITH AND WITHOUT CONTRAST TECHNIQUE: Multidetector CT imaging of the head and neck was performed using the standard protocol during bolus administration of intravenous contrast. Multiplanar CT image reconstructions and MIPs were obtained to evaluate the vascular anatomy. Carotid stenosis measurements (when applicable) are obtained utilizing NASCET criteria, using the  distal internal carotid diameter as the denominator. RADIATION DOSE REDUCTION: This exam was performed according to the departmental dose-optimization program which includes automated exposure control, adjustment of the mA and/or kV according to patient size and/or use of iterative reconstruction technique. CONTRAST:  60mL OMNIPAQUE  IOHEXOL  350 MG/ML SOLN COMPARISON:  Same day CT head and cervical spine. FINDINGS: CTA NECK FINDINGS Aortic arch: Standard configuration of the aortic arch. Imaged portion shows no evidence of aneurysm or dissection. No significant stenosis of the major arch vessel origins. Pulmonary arteries: As permitted by contrast timing, there are no filling defects in the visualized pulmonary arteries. Subclavian arteries: The subclavian arteries are patent bilaterally. Right carotid system: No evidence of dissection, stenosis (50% or greater), or occlusion. Tortuosity of the proximal common carotid artery. There is additional tortuosity throughout the internal carotid artery. Left carotid system: No evidence of dissection, stenosis (50% or greater), or occlusion. Minimal calcified atherosclerosis at the carotid bifurcation without stenosis. Mild tortuosity of the proximal common carotid artery. Additional tortuosity of the cervical ICA most pronounced distally. Vertebral arteries: The right vertebral artery origin is patent with abrupt occlusion of the proximal right V1 segment. There is reconstitution of the distal V1 and proximal V2 segments which is severely diminished in caliber and appears irregular with additional occlusion of the right V2  segment near the level of C4. Irregular reconstitution of the distal V2 segment. Calcified atherosclerosis noted along the V3 and proximal V4 segments. Left vertebral artery origin is patent with diminished caliber and intraluminal contrast of the left V2 segment with occlusion at approximately the level of C4-5. The distal V2, V3, and V4 segments are  occluded. Atherosclerosis along the proximal left V4 segment. Skeleton: No acute findings. Degenerative changes in the cervical spine. Disc space narrowing most pronounced at C6-7. Prominent anterior endplate osteophytes at multiple levels in the cervical spine. Edentulous maxilla. Other neck: The visualized airway is patent. No cervical lymphadenopathy. Upper chest: Visualized lung apices are clear. Review of the MIP images confirms the above findings CTA HEAD FINDINGS ANTERIOR CIRCULATION: The intracranial ICAs are patent bilaterally. Atherosclerosis of the bilateral carotid siphons resulting in moderate stenosis. There is additional severe stenosis of the left supraclinoid ICA. No proximal occlusion, aneurysm, or vascular malformation. MCAs: Patent bilaterally. Mild stenosis of the proximal M1 segment right MCA. Additional mild narrowing of a distal M2 segment of the right MCA. ACAs: The A1 segment of the left ACA is not visualized and may be stenotic secondary to atherosclerosis versus congenitally hypoplastic. Possible fenestration of the anterior communicating artery. The ACAs are patent distally. POSTERIOR CIRCULATION: Occlusion of the intracranial vertebral arteries as noted above. The basilar artery is patent but is diminished in caliber and irregular with moderate narrowing along the proximal aspect. The AICA is visualized on the left. The PICA are not well visualized. Bilateral superior cerebellar arteries noted. The PCAs are patent bilaterally but appear small in caliber. Moderate to severe narrowing of the P2 segment right PCA significantly diminished caliber of distal right PCA branches without evidence of focal occlusion. There is additional mild irregularity of the proximal P2 segment on the left. Severe stenosis of the posterior P2 segment of the left PCA. Diminished caliber of distal left PCA branches without focal occlusion. Venous sinuses: As permitted by contrast timing, patent. Anatomic  variants: None Review of the MIP images confirms the above findings IMPRESSION: Occlusion of the bilateral vertebral arteries as above. Irregular reconstitution of the distal V2 segment of the right vertebral artery with occlusion of the right V3 and V4 segments. The left vertebral artery is occluded at the V2 segment from the level of C4-5. Additional irregularity and moderate narrowing of the proximal basilar artery. The basilar artery is irregular throughout its course without occlusion. Diminutive caliber of the bilateral posterior cerebral arteries. Moderate to severe stenosis of the P2 segment right PCA. Additional severe stenosis of the posterior P2 segment of the left PCA. Severely diminished caliber of distal PCA branches bilaterally without evidence of focal occlusion. Atherosclerosis of the carotid siphons. Focal severe stenosis of the left supraclinoid ICA. Additional moderate stenosis of the bilateral cavernous ICAs. Tortuosity of the common carotid arteries and cervical internal carotid arteries which may be secondary to chronic hypertension. Electronically Signed   By: Denny Flack M.D.   On: 09/02/2023 20:00   DG Wrist Complete Right Result Date: 09/02/2023 CLINICAL DATA:  Pain after fall.  Swelling. EXAM: RIGHT WRIST - COMPLETE 3+ VIEW COMPARISON:  None Available. FINDINGS: There is no evidence of fracture or dislocation. Scattered osteoarthritis. Mild chondrocalcinosis. No erosive or bony destructive change. There is generalized subcutaneous edema. IMPRESSION: 1. Generalized subcutaneous edema. No fracture or subluxation of the right wrist. 2. Scattered osteoarthritis. Electronically Signed   By: Chadwick Colonel M.D.   On: 09/02/2023 18:25   MR BRAIN WO  CONTRAST Result Date: 09/02/2023 CLINICAL DATA:  Initial evaluation for acute dizziness and vertigo. EXAM: MRI HEAD WITHOUT CONTRAST TECHNIQUE: Multiplanar, multiecho pulse sequences of the brain and surrounding structures were obtained  without intravenous contrast. COMPARISON:  CT from 08/25/2023. FINDINGS: Brain: Diffuse prominence of the CSF containing spaces compatible with generalized cerebral atrophy, moderately advanced in nature. Patchy T2/FLAIR hyperintensity involving the supratentorial cerebral white matter, consistent with chronic small vessel ischemic disease, relatively mild for age. Patchy restricted diffusion seen involving the right greater than left cerebellar hemispheres (series 6, image 6). Associated T2/FLAIR signal abnormality with mild signal loss on ADC map, consistent with early subacute ischemic infarcts. Associated minimal petechial blood products at the right cerebellum without hemorrhagic transformation. No significant regional mass effect. These changes are superimposed on a few scattered underlying chronic cerebellar infarcts. No mass lesion, midline shift or mass effect. No hydrocephalus or extra-axial fluid collection. Pituitary gland within normal limits. Vascular: Irregular flow voids within the V4 segments bilaterally, which could reflect slow flow and/or occlusion. Anterior circulation flow voids maintained. Skull and upper cervical spine: Degenerative osteoarthritic changes about the C1-2 articulations without significant stenosis. Bone marrow signal intensity within normal limits. No scalp soft tissue abnormality. Sinuses/Orbits: Prior bilateral ocular lens replacement. Paranasal sinuses are clear. No mastoid effusion. Other: None. IMPRESSION: 1. Patchy early subacute ischemic infarcts involving the right greater than left cerebellar hemispheres. Associated minimal petechial blood products at the right cerebellum without hemorrhagic transformation or significant regional mass effect. 2. Irregular flow voids within the V4 segments bilaterally, which could reflect slow flow and/or occlusion. Correlation with dedicated vascular imaging recommended. 3. Underlying moderately advanced cerebral atrophy with mild  chronic small vessel ischemic disease. Electronically Signed   By: Virgia Griffins M.D.   On: 09/02/2023 18:12   CT Head Wo Contrast Result Date: 09/02/2023 CLINICAL DATA:  Fall 2 days ago, dizziness EXAM: CT HEAD WITHOUT CONTRAST CT CERVICAL SPINE WITHOUT CONTRAST TECHNIQUE: Multidetector CT imaging of the head and cervical spine was performed following the standard protocol without intravenous contrast. Multiplanar CT image reconstructions of the cervical spine were also generated. RADIATION DOSE REDUCTION: This exam was performed according to the departmental dose-optimization program which includes automated exposure control, adjustment of the mA and/or kV according to patient size and/or use of iterative reconstruction technique. COMPARISON:  CT brain, 08/25/2023, same-day MR brain FINDINGS: CT HEAD FINDINGS Brain: Hypodense lesions in the cerebellar hemispheres, measuring 3.4 x 3.2 cm on the right (series 3, image 7) and 0.8 cm and 1.8 x 1.2 cm on the left (series 3, image 8). Vascular: No hyperdense vessel or unexpected calcification. Skull: Normal. Negative for fracture or focal lesion. Sinuses/Orbits: No acute finding. Other: None. CT CERVICAL SPINE FINDINGS Alignment: Straightening of the normal cervical lordosis. Skull base and vertebrae: No acute fracture. No primary bone lesion or focal pathologic process. Soft tissues and spinal canal: No prevertebral fluid or swelling. No visible canal hematoma. Disc levels: Mild-to-moderate multilevel cervical disc degenerative disease throughout the cervical spine. Upper chest: Negative. Other: None. IMPRESSION: 1. Hypodense lesions in the cerebellar hemispheres, measuring 3.4 x 3.2 cm on the right and 0.8 cm and 1.8 x 1.2 cm on the left. Please see forthcoming MR for further characterization. 2. No acute fracture or static subluxation of the cervical spine. 3. Mild-to-moderate multilevel cervical disc degenerative disease throughout the cervical spine.  Electronically Signed   By: Fredricka Jenny M.D.   On: 09/02/2023 17:16   CT Cervical Spine Wo Contrast Result Date:  09/02/2023 CLINICAL DATA:  Fall 2 days ago, dizziness EXAM: CT HEAD WITHOUT CONTRAST CT CERVICAL SPINE WITHOUT CONTRAST TECHNIQUE: Multidetector CT imaging of the head and cervical spine was performed following the standard protocol without intravenous contrast. Multiplanar CT image reconstructions of the cervical spine were also generated. RADIATION DOSE REDUCTION: This exam was performed according to the departmental dose-optimization program which includes automated exposure control, adjustment of the mA and/or kV according to patient size and/or use of iterative reconstruction technique. COMPARISON:  CT brain, 08/25/2023, same-day MR brain FINDINGS: CT HEAD FINDINGS Brain: Hypodense lesions in the cerebellar hemispheres, measuring 3.4 x 3.2 cm on the right (series 3, image 7) and 0.8 cm and 1.8 x 1.2 cm on the left (series 3, image 8). Vascular: No hyperdense vessel or unexpected calcification. Skull: Normal. Negative for fracture or focal lesion. Sinuses/Orbits: No acute finding. Other: None. CT CERVICAL SPINE FINDINGS Alignment: Straightening of the normal cervical lordosis. Skull base and vertebrae: No acute fracture. No primary bone lesion or focal pathologic process. Soft tissues and spinal canal: No prevertebral fluid or swelling. No visible canal hematoma. Disc levels: Mild-to-moderate multilevel cervical disc degenerative disease throughout the cervical spine. Upper chest: Negative. Other: None. IMPRESSION: 1. Hypodense lesions in the cerebellar hemispheres, measuring 3.4 x 3.2 cm on the right and 0.8 cm and 1.8 x 1.2 cm on the left. Please see forthcoming MR for further characterization. 2. No acute fracture or static subluxation of the cervical spine. 3. Mild-to-moderate multilevel cervical disc degenerative disease throughout the cervical spine. Electronically Signed   By: Fredricka Jenny M.D.   On: 09/02/2023 17:16    Vitals:   09/03/23 0026 09/03/23 0439 09/03/23 0817 09/03/23 1201  BP: (!) 149/84 131/81 (!) 140/77 130/83  Pulse: 77 73 72 97  Resp: 18 18 16 20   Temp: 98.6 F (37 C) 98.4 F (36.9 C) 98.6 F (37 C) 98.1 F (36.7 C)  TempSrc: Oral Oral Oral Oral  SpO2: 92% 94% 96% 96%  Weight:      Height:         PHYSICAL EXAM General:  Alert, well-nourished, well-developed patient in no acute distress Psych:  Mood and affect appropriate for situation CV: Regular rate and rhythm on monitor Respiratory:  Regular, unlabored respirations on room air GI: Abdomen soft and nontender   NEURO:  Mental Status: AA&Ox3, patient is able to give clear and coherent history Speech/Language: speech is without aphasia.  Mild dysarthria. Naming, repetition, fluency, and comprehension intact.  Cranial Nerves:  II: PERRL. Visual fields full.  III, IV, VI: EOMI. Eyelids elevate symmetrically.  V: Sensation is intact to light touch and symmetrical to face.  VII: Face is symmetrical resting and smiling VIII: hearing intact to voice. IX, X: Palate elevates symmetrically. Phonation is normal.  ZO:[id number] shrug 5/5. XII: tongue is midline without fasciculations. Motor: 5/5 strength to all muscle groups tested.  Tone: is normal and bulk is normal Sensation- Intact to light touch bilaterally. Extinction absent to light touch to DSS.   Coordination: FTN intact bilaterally, HKS: no ataxia in BLE.No drift.  Gait- deferred  Most Recent NIH 1   ASSESSMENT/PLAN  [name] is a 88 y.o. male with history of hypertension, hyperlipidemia, CKD, CHF, CAD, nonischemic cardiomyopathy, multiple myeloma, prostate cancer, prior stroke, diabetes, vertigo, peripheral neuropathy, admitted for unsteady gait and experiencing a fall.  NIH on Admission 1  Stroke: Bilateral cerebellar infarct, right more than left, etiology: Large vessel disease from severe posterior circulation  stenosis  CT head  Hypodense lesions in the cerebellar hemispheres, measuring 3.4 x3.2 cm on the right and 0.8 cm and 1.8 x 1.2 cm on the left. CTA head & neck Occlusion of the bilateral vertebral arteries. Irregular reconstitution of the distal V2 segment of the right vertebral artery with occlusion of the right V3 and V4 segments. The left vertebral artery is occluded at the V2 segment from the level of C4-5.  irregularity and moderate narrowing of the proximal basilar artery. The basilar artery is irregular throughout its course without occlusion. Moderate to severe stenosis of the P2 segment right PCA. Additional severe stenosis of the posterior P2 segment of the left PCA. MRI   Patchy early subacute ischemic infarcts involving the right greater than left cerebellar hemispheres.  2D Echo EF 25 to 30% LDL 57 HgbA1c 7.1 VTE prophylaxis -Lovenox  aspirin  81 mg daily prior to admission, now on aspirin  81 mg daily and clopidogrel  75 mg daily for 3 months and then Plavix  alone. Therapy recommendations:  CIR Disposition: Pending  CHF Cardiomyopathy 2D echo EF 35 to 40% in 08/2021 This admission EF 25 to 30% On torsemide  Recommend cardiology consultation.  Hypertension Home meds: Carvedilol  6.25 mg, Imdur  30 mg, torsemide  20 mg Stable Blood Pressure Goal: BP less than 180/105  Long-term BP goal normotensive  Lipid management Home meds: None LDL 57, goal < 70 No statin indicated given LDL at goal  Diabetes type II UnControlled Home meds: Glipizide  5 mg HgbA1c 7.1, goal < 7.0 CBGs SSI Recommend close follow-up with PCP for better DM control  Other Stroke Risk Factors Obesity, Body mass index is 33.67 kg/m., BMI >/= 30 associated with increased stroke risk, recommend weight loss, diet and exercise as appropriate   Other Active Problems CKD Acute UTI, on Rocephin BPH CKD 3B, creatinine 2.28 Right wrist pain due to fall  Hospital day # 1  Jonette Nestle DNP, ACNPC-AG  [facility]  ATTENDING NOTE: I reviewed above note and agree with the assessment and plan. Pt was seen and examined.   Echo tech at bedside performing echo. Pt is lying in bed, awake, alert, eyes open, orientated to age, place, time. No aphasia, paucity of speech, following all simple commands. Able to name and repeat. No gaze palsy, tracking bilaterally, visual field full, no nystagmus. No facial droop. Tongue midline. Bilateral UEs 5/5, no drift, but with right wrist brace due to pain from falls. Bilaterally LEs 4/5, no drift. Sensation symmetrical bilaterally, b/l FTN intact, no ataixa, gait not tested.   For detailed assessment and plan, please refer to above as I have made changes wherever appropriate.   Consuelo Denmark, MD PhD Stroke Neurology 09/03/2023 3:12 PM  I spent additional 30 minutes inpatient face-to-face time with the patient, more than 50% of which was spent in counseling and coordination of care, reviewing test results, images and medication, and discussing the diagnosis, treatment plan and potential prognosis. This patient's care requiresreview of multiple databases, neurological assessment, discussion with family, other specialists and medical decision making of high complexity.       To contact Stroke Continuity provider, please refer to [email or web address]. After hours, contact General Neurology

## 2023-09-03 NOTE — Progress Notes (Signed)
 Pt wanting to be placed on CPA for nap. RT at bedside and placed pt on CPAP.

## 2023-09-03 NOTE — Progress Notes (Signed)
  Echocardiogram 2D Echocardiogram has been performed.  Fain Home RDCS 09/03/2023, 3:21 PM

## 2023-09-03 NOTE — Consult Note (Addendum)
 NEUROLOGY CONSULT NOTE   Date of service: Sep 03, 2023 Patient Name: Allen Dyer MRN:  [id number] DOB:  08-01-1935 Chief Complaint: "Dizziness" Requesting Provider: Vita Grip, MD  History of Present Illness  Allen Dyer is a 88 y.o. male with hx of hypertension, hyperlipidemia, nonischemic cardiomyopathy, diabetes who presents with several weeks of unsteady gait.  Due to the unsteady gait, he fell and hit his wrist and he presented to the emergency department yesterday due to wrist pain and wanting to get it x-rayed.  In that setting, it was mentioned that he had been falling more and therefore MRI was obtained which demonstrated subacute cerebellar stroke.  CT angio was obtained which demonstrates severe posterior circulation disease  LKW: 2 weeks ago IV Thrombolysis: Outside of window EVT: Outside of window  NIHSS components Score: Comment  1a Level of Conscious 0[]  1[]  2[]  3[]      1b LOC Questions 0[]  1[]  2[]       1c LOC Commands 0[]  1[]  2[]       2 Best Gaze 0[]  1[]  2[]       3 Visual 0[]  1[]  2[]  3[]      4 Facial Palsy 0[]  1[]  2[]  3[]      5a Motor Arm - left 0[]  1[]  2[]  3[]  4[]  UN[]    5b Motor Arm - Right 0[]  1[]  2[]  3[]  4[]  UN[]    6a Motor Leg - Left 0[]  1[]  2[]  3[]  4[]  UN[]    6b Motor Leg - Right 0[]  1[]  2[]  3[]  4[]  UN[]    7 Limb Ataxia 0[]  1[]  2[]  UN[]      8 Sensory 0[]  1[]  2[]  UN[]      9 Best Language 0[]  1[]  2[]  3[]      10 Dysarthria 0[]  1[x]  2[]  UN[]      11 Extinct. and Inattention 0[]  1[]  2[]       TOTAL: 1        Past History   Past Medical History:  Diagnosis Date   Chronic combined systolic and diastolic CHF (congestive heart failure) (HCC)    CKD (chronic kidney disease), stage III (HCC)    Hyperlipidemia    Hypertension    Lower extremity edema    Mild CAD    a. mild-mod by cath 05/2018.   Multiple myeloma (HCC) 11/15/2018   NICM (nonischemic cardiomyopathy) (HCC)    Peripheral neuropathy    Prostate cancer (HCC)    Status post XRT    Rheumatic fever    Stroke [facility])    Trifascicular block    Type 2 diabetes mellitus (HCC)    Vertigo     Past Surgical History:  Procedure Laterality Date   RIGHT HEART CATH N/A 07/10/2018   Procedure: RIGHT HEART CATH;  Surgeon: Darlis Eisenmenger, MD;  Location: [facility] INVASIVE CV LAB;  Service: Cardiovascular;  Laterality: N/A;   RIGHT/LEFT HEART CATH AND CORONARY ANGIOGRAPHY N/A 06/18/2018   Procedure: RIGHT/LEFT HEART CATH AND CORONARY ANGIOGRAPHY;  Surgeon: Millicent Ally, MD;  Location: [facility] INVASIVE CV LAB;  Service: Cardiovascular;  Laterality: N/A;    Family History: Family History  Problem Relation Age of Onset   Cancer Mother    Congestive Heart Failure Father        died from heart failure at the age of 61   Cancer Sister    Congestive Heart Failure Brother        died from heart failure at the age of 22   Stroke Neg Hx     Social History  reports that he  quit smoking about 52 years ago. His smoking use included cigarettes. He started smoking about 62 years ago. He has a 20 pack-year smoking history. He has never used smokeless tobacco. He reports that he does not currently use alcohol. He reports that he does not use drugs.  Allergies  Allergen Reactions   Tramadol Other (See Comments)    Dizziness (intolerance) hallucinate   Finasteride Swelling    Breast enlargement    Jardiance  [Empagliflozin ]     Yeast infection   Lisinopril Cough   Ciprofloxacin Other (See Comments)    Dizziness (intolerance)    Simvastatin Hives and Other (See Comments)    Blisters/ Rash    Sulfa Antibiotics Rash    Medications   Current Facility-Administered Medications:     stroke: early stages of recovery book, , Does not apply, Once, Vita Grip, MD   acetaminophen  (TYLENOL ) tablet 650 mg, 650 mg, Oral, Q4H PRN, 650 mg at 09/02/23 2233 **OR** acetaminophen  (TYLENOL ) 160 MG/5ML solution 650 mg, 650 mg, Per Tube, Q4H PRN **OR** acetaminophen  (TYLENOL ) suppository 650 mg, 650  mg, Rectal, Q4H PRN, Vita Grip, MD   aspirin  tablet 325 mg, 325 mg, Oral, Daily, Vita Grip, MD, 325 mg at 09/02/23 1757   cefTRIAXone  (ROCEPHIN ) 2 g in sodium chloride  0.9 % 100 mL IVPB, 2 g, Intravenous, Q24H, Amponsah, Annette Killings, MD, Stopped at 09/02/23 1847   cholecalciferol  (VITAMIN D3) 25 MCG (1000 UNIT) tablet 1,000 Units, 1,000 Units, Oral, q AM, Amponsah, Annette Killings, MD   cyanocobalamin  (VITAMIN B12) tablet 1,000 mcg, 1,000 mcg, Oral, q morning, Amponsah, Annette Killings, MD   enoxaparin  (LOVENOX ) injection 30 mg, 30 mg, Subcutaneous, Q24H, Amponsah, Annette Killings, MD, 30 mg at 09/02/23 2234   insulin  aspart (novoLOG ) injection 0-15 Units, 0-15 Units, Subcutaneous, TID WC, Amponsah, Annette Killings, MD   insulin  aspart (novoLOG ) injection 0-5 Units, 0-5 Units, Subcutaneous, QHS, Amponsah, Prosper M, MD   potassium chloride  SA (KLOR-CON  M) CR tablet 40 mEq, 40 mEq, Oral, Daily, Amponsah, Annette Killings, MD   senna-docusate (Senokot-S) tablet 1 tablet, 1 tablet, Oral, QHS PRN, Vita Grip, MD   tamsulosin  (FLOMAX ) capsule 0.4 mg, 0.4 mg, Oral, BID, Vita Grip, MD, 0.4 mg at 09/02/23 2233   torsemide  (DEMADEX ) tablet 60 mg, 60 mg, Oral, BID, Vita Grip, MD, 60 mg at 09/02/23 2233  Vitals   Vitals:   09/02/23 2003 09/02/23 2130 09/02/23 2208 09/03/23 0026  BP: (!) 154/79 (!) 166/79 (!) 151/84 (!) 149/84  Pulse: 81 84 80 77  Resp: 17 18 18 18   Temp: 99.1 F (37.3 C)  99.1 F (37.3 C) 98.6 F (37 C)  TempSrc: Oral  Oral Oral  SpO2: 96% 92% 97% 92%  Weight:      Height:        Body mass index is 33.67 kg/m.  Physical Exam   Constitutional: Appears well-developed and well-nourished.   Neurologic Examination    Neuro: Mental Status: Patient is awake, alert, oriented to person, place, month, year, and situation. Patient is able to give a clear and coherent history. No signs of aphasia or neglect He does have mildly dysarthric speech Cranial  Nerves: II: Visual Fields are full. Pupils are equal, round, and reactive to light.   III,IV, VI: EOMI without ptosis or diploplia.  V: Facial sensation is symmetric to temperature VII: Facial movement is symmetric.  VIII: hearing is intact to voice X: Uvula elevates symmetrically XII: tongue is midline without atrophy  or fasciculations.  Motor: Tone is normal. Bulk is normal. 5/5 strength was present in all four extremities.  There is limitation of the right hand due to pain from recent fall but he is able to hold the arm aloft without drift Sensory: Sensation is symmetric to light touch and temperature in the arms and legs. Cerebellar: FNF and HKS are intact bilaterally        Labs/Imaging/Neurodiagnostic studies   CBC:  Recent Labs  Lab 19-Sep-2023 1642  WBC 7.9  NEUTROABS 6.0  HGB 12.4*  HCT 38.3*  MCV 101.6*  PLT 153   Basic Metabolic Panel:  Lab Results  Component Value Date   NA 138 09/19/2023   K 3.6 09/19/23   CO2 30 Sep 19, 2023   GLUCOSE 171 (H) 09/19/23   BUN 38 (H) 09/19/2023   CREATININE 2.28 (H) 09-19-2023   CALCIUM  9.7 2023-09-19   GFRNONAA 27 (L) 2023/09/19   GFRAA 26 (L) 12/02/2019   Lipid Panel:  Lab Results  Component Value Date   LDLCALC 59 01/16/2022   HgbA1c:  Lab Results  Component Value Date   HGBA1C 7.1 (A) 03/09/2023   Urine Drug Screen:     Component Value Date/Time   LABOPIA NONE DETECTED 09/11/2021 1225   COCAINSCRNUR NONE DETECTED 09/11/2021 1225   LABBENZ NONE DETECTED 09/11/2021 1225   AMPHETMU NONE DETECTED 09/11/2021 1225   THCU NONE DETECTED 09/11/2021 1225   LABBARB NONE DETECTED 09/11/2021 1225    Alcohol Level No results found for: "ETH" INR  Lab Results  Component Value Date   INR 1.0 09/11/2021   APTT  Lab Results  Component Value Date   APTT 29 09/11/2021    CT angio Head and Neck with contrast(Personally reviewed): Severe posterior circulation disease  MRI Brain(Personally reviewed): Patchy  posterior circulation infarcts   ASSESSMENT   Jayten Gabbard is a 88 y.o. male with patchy posterior circulation infarcts in the setting of severe posterior circulation disease.  Given that he has multiple weeks from his onset of symptoms, I do not think he is any type of interventional candidate and I would favor treating him conservatively with medical management.  Secondary risk factor prevention consist of secondary risk factor modification such as diabetes, hypertension, hyperlipidemia.  He is outside of any type of permissive hypertensive window.  He is 2 weeks from onset, and I would favor adding Plavix  to aspirin  for now.  RECOMMENDATIONS  Aspirin  81 mg daily and Plavix  75 mg daily Echo, telemetry Lipids, A1c PT, OT, ST Stroke team to follow ______________________________________________________________________    Signed, Ann Keto, MD [facility]  I spent 60 minutes in the care of this patient on the date of service including reviewing records, discussing his care with the hospitalist team and other team members, evaluating the patient, and documenting in the medical record.

## 2023-09-03 NOTE — Evaluation (Addendum)
 Physical Therapy Evaluation Patient Details Name: Allen Dyer MRN: [id number] DOB: [birth year] Today's Date: 09/03/2023  History of Present Illness  Gregery Devincenzi is a 88 y.o. male who presents with several weeks of unsteady gait.  Due to the unsteady gait, he fell and hit his wrist and he presented to the emergency department yesterday due to wrist pain and wanting to get it x-rayed. MRI was obtained which demonstrated subacute cerebellar stroke. CT angio was obtained which demonstrates severe posterior circulation disease.  Past medical hx of hypertension, hyperlipidemia, nonischemic cardiomyopathy, diabetes.  Clinical Impression  Pt presents with admitting diagnosis above. Pt today able to ambulate to door and back with a rather unsteady gait pattern. Opted for HHA due to wrist pain as pt is unable to grip RW. PTA pt reports he was fully independent and lives with his daughter who is able to provide 24/7 assistance. Recommend HHPT upon DC. PT will continue to follow.     Addendum 1149: After conversation with OT, updating pt DC recs to CIR.    If plan is discharge home, recommend the following: A little help with walking and/or transfers;A little help with bathing/dressing/bathroom;Help with stairs or ramp for entrance;Supervision due to cognitive status;Assist for transportation;Assistance with cooking/housework   Can travel by private vehicle        Equipment Recommendations Cane;Rolling walker (2 wheels)  Recommendations for Other Services       Functional Status Assessment Patient has had a recent decline in their functional status and demonstrates the ability to make significant improvements in function in a reasonable and predictable amount of time.     Precautions / Restrictions Precautions Precautions: Fall Recall of Precautions/Restrictions: Intact Restrictions Weight Bearing Restrictions Per Provider Order: No      Mobility  Bed Mobility Overal bed mobility: Needs  Assistance Bed Mobility: Supine to Sit, Sit to Supine     Supine to sit: Contact guard Sit to supine: Contact guard assist   General bed mobility comments: CGA for safety. Verbal cues for sequencing.    Transfers Overall transfer level: Needs assistance Equipment used: 1 person hand held assist Transfers: Sit to/from Stand Sit to Stand: Min assist           General transfer comment: Min A to power up. Urinary incontinence upon initial sit to stand.    Ambulation/Gait Ambulation/Gait assistance: Min assist Gait Distance (Feet): 10 Feet Assistive device: 1 person hand held assist Gait Pattern/deviations: Ataxic, Trunk flexed, Decreased stride length, Step-through pattern, Wide base of support Gait velocity: decreased     General Gait Details: Pt able to ambulate to door and back with a rather unsteady gait pattern. Opted for HHA due to wrist pain as pt is unable to grip RW.  Stairs            Wheelchair Mobility     Tilt Bed    Modified Rankin (Stroke Patients Only) Modified Rankin (Stroke Patients Only) Pre-Morbid Rankin Score: No symptoms Modified Rankin: Slight disability     Balance Overall balance assessment: Needs assistance Sitting-balance support: Feet supported, Single extremity supported Sitting balance-Leahy Scale: Good Sitting balance - Comments: EOB   Standing balance support: Single extremity supported, During functional activity Standing balance-Leahy Scale: Poor Standing balance comment: Reliant on external support.                             Pertinent Vitals/Pain Pain Assessment Pain Assessment: 0-10 Pain Score: 5  Pain Location: R wrist Pain Descriptors / Indicators: Sharp Pain Intervention(s): Monitored during session, Limited activity within patient's tolerance, Ice applied    Home Living Family/patient expects to be discharged to:: Private residence Living Arrangements: Children Available Help at Discharge:  Family;Available 24 hours/day Type of Home: House Home Access: Stairs to enter Entrance Stairs-Rails: Can reach both Entrance Stairs-Number of Steps: 5 Alternate Level Stairs-Number of Steps: 10 (very steep per daughter) Home Layout: Two level;Able to live on main level with bedroom/bathroom;Bed/bath upstairs Home Equipment: Jeananne Mighty - single point      Prior Function Prior Level of Function : Independent/Modified Independent;History of Falls (last six months) (2 falls in the last 6 months. Mechanical in nature)             Mobility Comments: Ind ADLs Comments: Ind     Extremity/Trunk Assessment   Upper Extremity Assessment Upper Extremity Assessment: RUE deficits/detail RUE Deficits / Details: R wrist pain from fall RUE: Unable to fully assess due to pain    Lower Extremity Assessment Lower Extremity Assessment: Overall WFL for tasks assessed (Pt did report numbness on L patella.)    Cervical / Trunk Assessment Cervical / Trunk Assessment: Kyphotic  Communication   Communication Communication: No apparent difficulties    Cognition Arousal: Alert Behavior During Therapy: WFL for tasks assessed/performed   PT - Cognitive impairments: Problem solving, Sequencing                       PT - Cognition Comments: Pt was alert and oriented x4 however some slow processing and problem solving issues were noted. Following commands: Impaired Following commands impaired: Follows one step commands with increased time     Cueing Cueing Techniques: Verbal cues, Tactile cues     General Comments General comments (skin integrity, edema, etc.): VSS    Exercises     Assessment/Plan    PT Assessment Patient needs continued PT services  PT Problem List Decreased strength;Decreased activity tolerance;Decreased range of motion;Decreased balance;Decreased mobility;Decreased coordination;Decreased cognition;Decreased knowledge of use of DME;Decreased safety  awareness;Decreased knowledge of precautions;Cardiopulmonary status limiting activity       PT Treatment Interventions DME instruction;Gait training;Functional mobility training;Stair training;Therapeutic exercise;Therapeutic activities;Neuromuscular re-education;Balance training;Patient/family education    PT Goals (Current goals can be found in the Care Plan section)  Acute Rehab PT Goals Patient Stated Goal: to get better PT Goal Formulation: With patient Time For Goal Achievement: 09/17/23 Potential to Achieve Goals: Fair    Frequency Min 4X/week     Co-evaluation               AM-PAC PT "6 Clicks" Mobility  Outcome Measure Help needed turning from your back to your side while in a flat bed without using bedrails?: A Little Help needed moving from lying on your back to sitting on the side of a flat bed without using bedrails?: A Little Help needed moving to and from a bed to a chair (including a wheelchair)?: A Little Help needed standing up from a chair using your arms (e.g., wheelchair or bedside chair)?: A Little Help needed to walk in hospital room?: A Little Help needed climbing 3-5 steps with a railing? : A Lot 6 Click Score: 17    End of Session Equipment Utilized During Treatment: Gait belt Activity Tolerance: Patient tolerated treatment well Patient left: in bed;with call bell/phone within reach;with bed alarm set;with family/visitor present Nurse Communication: Mobility status PT Visit Diagnosis: Other abnormalities of gait and mobility (R26.89);Repeated  falls (R29.6)    Time: 6433-2951 PT Time Calculation (min) (ACUTE ONLY): 40 min   Charges:   PT Evaluation $PT Eval Moderate Complexity: 1 Mod PT Treatments $Gait Training: 8-22 mins $Therapeutic Activity: 8-22 mins PT General Charges $$ ACUTE PT VISIT: 1 Visit         Rodgers Clack, PT, DPT Acute Rehab Services [phone]   Latonia Conrow 09/03/2023, 10:23 AM

## 2023-09-03 NOTE — Progress Notes (Signed)

## 2023-09-03 NOTE — Evaluation (Signed)
 Occupational Therapy Evaluation Patient Details Name: Allen Dyer MRN: [id number] DOB: 03/09/1936 Today's Date: 09/03/2023   History of Present Illness   Allen Dyer is a 88 y.o. male who presents with unsteady gait and fall, sustaining a R wrist injury - xray demonstrates Generalized subcutaneous edema; No fracture or subluxation of the  right wrist.  MRI (+) Patchy early subacute ischemic infarcts involving the right  greater than left cerebellar hemispheres. Past medical hx of hypertension, yperlipidemia, nonischemic cardiomyopathy, diabetes.     Clinical Impressions PTA pt lives independently with his daughter. Pt demonstrates a significant decline in functional status, requiring min A with limited mobility and mod A with ADL tasks due to below listed deficits. Pt is at high risk for falls and states he has a "fear of falling" because he is "so unsteady". Began habituation exercises with pt - daughter present for education. Pt is very motivated, has excellent family support and will benefit from intensive inpatient follow-up therapy, >3 hours/day to maximize functional level of independence to facilitate safe DC home with family.      If plan is discharge home, recommend the following:   A lot of help with walking and/or transfers;A lot of help with bathing/dressing/bathroom;Assistance with cooking/housework;Direct supervision/assist for medications management;Direct supervision/assist for financial management;Assist for transportation;Help with stairs or ramp for entrance     Functional Status Assessment   Patient has had a recent decline in their functional status and demonstrates the ability to make significant improvements in function in a reasonable and predictable amount of time.     Equipment Recommendations   BSC/3in1     Recommendations for Other Services   Rehab consult     Precautions/Restrictions   Precautions Precautions: Fall Recall of  Precautions/Restrictions: Intact Required Braces or Orthoses:  (requesteed R wrist cock-up splint) Restrictions Weight Bearing Restrictions Per Provider Order: No Other Position/Activity Restrictions: as tolerates     Mobility Bed Mobility Overal bed mobility: Needs Assistance Bed Mobility: Supine to Sit     Supine to sit: Min assist     General bed mobility comments: rolling R with lower HOB    Transfers Overall transfer level: Needs assistance Equipment used: 1 person hand held assist Transfers: Sit to/from Stand Sit to Stand: Min assist           General transfer comment: Min A to power up. Urinary incontinence upon initial sit to stand.      Balance Overall balance assessment: Needs assistance, History of Falls Sitting-balance support: Feet supported, Single extremity supported Sitting balance-Leahy Scale: Good Sitting balance - Comments: EOB   Standing balance support: Single extremity supported, During functional activity Standing balance-Leahy Scale: Poor Standing balance comment: Reliant on external support.                           ADL either performed or assessed with clinical judgement   ADL Overall ADL's : Needs assistance/impaired Eating/Feeding: Set up   Grooming: Set up;Sitting   Upper Body Bathing: Minimal assistance;Sitting   Lower Body Bathing: Moderate assistance;Sit to/from stand   Upper Body Dressing : Minimal assistance;Sitting   Lower Body Dressing: Moderate assistance;Sit to/from stand   Toilet Transfer: Minimal assistance;Stand-pivot   Toileting- Clothing Manipulation and Hygiene: Maximal assistance       Functional mobility during ADLs: Minimal assistance General ADL Comments: very unsteady; requires external support     Vision Baseline Vision/History: 1 Wears glasses Ability to See in Adequate Light: 0  Adequate Patient Visual Report: Blurring of vision;Eye fatigue/eye pain/headache Vision Assessment?:  Yes Eye Alignment: Within Functional Limits Ocular Range of Motion: Within Functional Limits Alignment/Gaze Preference: Within Defined Limits Tracking/Visual Pursuits: Decreased smoothness of vertical tracking;Decreased smoothness of horizontal tracking (especailly toward R) Saccades: Decreased speed of saccadic movement;Impaired - to be further tested in functional context Convergence: Within functional limits Visual Fields: No apparent deficits Additional Comments: poor visual fixation and gaze stabilization; unable to stabilizee gaze on Head Impulse Test     Perception Perception: Within Functional Limits       Praxis         Pertinent Vitals/Pain Pain Assessment Pain Assessment: 0-10 Pain Score: 6  Pain Location: R wrist Pain Descriptors / Indicators: Sharp Pain Intervention(s): Limited activity within patient's tolerance, Repositioned, Ice applied     Extremity/Trunk Assessment Upper Extremity Assessment Upper Extremity Assessment: Right hand dominant;RUE deficits/detail RUE Deficits / Details: R wrist pain from fall; difficulty assessing movement due to wrist pain which is affecting his ability to move his elbow and hand; unable to use R hand functionally; gross grasp lacking @ 1 in from palmer crease; unable to tolerate any wrist movement; daughter reports uncoordinated movement prior to fall RUE: Unable to fully assess due to pain RUE Coordination: decreased fine motor   Lower Extremity Assessment Lower Extremity Assessment: Defer to PT evaluation   Cervical / Trunk Assessment Cervical / Trunk Assessment: Kyphotic   Communication Communication Communication: No apparent difficulties   Cognition Arousal: Alert Behavior During Therapy: WFL for tasks assessed/performed Cognition: Cognition impaired         Attention impairment (select first level of impairment): Selective attention Executive functioning impairment (select all impairments): Reasoning, Problem  solving OT - Cognition Comments: will further assess; fmaily reports slow processing                 Following commands: Impaired Following commands impaired: Follows one step commands with increased time     Cueing  General Comments   Cueing Techniques: Verbal cues;Tactile cues;Visual cues  VSS   Exercises Exercises: Other exercises Other Exercises Other Exercises: visual fixation Other Exercises: smooth pursuits Other Exercises: gaze stabilization   Shoulder Instructions      Home Living Family/patient expects to be discharged to:: Private residence Living Arrangements: Children Available Help at Discharge: Family;Available 24 hours/day Type of Home: House Home Access: Stairs to enter [employer] of Steps: 5 Entrance Stairs-Rails: Can reach both Home Layout: Two level;Able to live on main level with bedroom/bathroom;Bed/bath upstairs Alternate Level Stairs-Number of Steps: 10 (very steep per daughter) Alternate Level Stairs-Rails: Left Bathroom Shower/Tub: Tub/shower unit (Only shower is upstairs)   Firefighter: Handicapped height Bathroom Accessibility: Yes How Accessible: Accessible via walker Home Equipment: Cane - single point          Prior Functioning/Environment Prior Level of Function : Independent/Modified Independent;History of Falls (last six months) (2 falls in the last 6 months. Mechanical in nature)             Mobility Comments: Ind ADLs Comments: Ind (hx of urinary issues - had been scheduled with urology to have a procedure for a "blockage"; wears C-pap at night)    OT Problem List: Decreased strength;Decreased range of motion;Impaired vision/perception;Impaired balance (sitting and/or standing);Decreased activity tolerance;Decreased coordination;Decreased cognition;Decreased safety awareness;Decreased knowledge of use of DME or AE;Decreased knowledge of precautions;Impaired UE functional use;Pain;Increased edema   OT  Treatment/Interventions: Self-care/ADL training;Therapeutic exercise;Neuromuscular education;DME and/or AE instruction;Therapeutic activities;Cognitive remediation/compensation;Visual/perceptual remediation/compensation;Patient/family education;Balance training  OT Goals(Current goals can be found in the care plan section)   Acute Rehab OT Goals Patient Stated Goal: to get better OT Goal Formulation: With patient/family Time For Goal Achievement: 09/17/23 Potential to Achieve Goals: Good   OT Frequency:  Min 2X/week    Co-evaluation              AM-PAC OT "6 Clicks" Daily Activity     Outcome Measure Help from another person eating meals?: None Help from another person taking care of personal grooming?: A Little Help from another person toileting, which includes using toliet, bedpan, or urinal?: A Lot Help from another person bathing (including washing, rinsing, drying)?: A Lot Help from another person to put on and taking off regular upper body clothing?: A Little Help from another person to put on and taking off regular lower body clothing?: A Lot 6 Click Score: 16   End of Session Equipment Utilized During Treatment: Gait belt Nurse Communication: Mobility status  Activity Tolerance: Patient tolerated treatment well Patient left: in chair;with call bell/phone within reach;with chair alarm set;with family/visitor present  OT Visit Diagnosis: Unsteadiness on feet (R26.81);Other abnormalities of gait and mobility (R26.89);Muscle weakness (generalized) (M62.81);History of falling (Z91.81);Ataxia, unspecified (R27.0);Other symptoms and signs involving cognitive function;Dizziness and giddiness (R42);Pain Pain - Right/Left: Right Pain - part of body: Hand                Time: 2841-3244 OT Time Calculation (min): 57 min Charges:  OT General Charges $OT Visit: 1 Visit OT Evaluation $OT Eval Moderate Complexity: 1 Mod OT Treatments $Self Care/Home Management : 8-22  mins $Therapeutic Activity: 8-22 mins $Therapeutic Exercise: 8-22 mins  Milburn Aliment, OT/L   Acute OT Clinical Specialist Acute Rehabilitation Services Pager [phone] Office [phone]   [facility] 09/03/2023, 12:09 PM

## 2023-09-03 NOTE — Progress Notes (Signed)
 PROGRESS NOTE    Allen Dyer  ZOX:[id number] DOB: October 09, 1935 DOA: 09/02/2023 PCP: Lanae Pinal, MD    Brief Narrative:  88 year old with history of chronic combined heart failure, nonischemic cardiomyopathy known ejection fraction 35%, stroke, prostate cancer status postradiation therapy and BPH now, GERD, multiple myeloma, nonobstructive coronary artery disease, hypertension hyperlipidemia and type 2 diabetes, CKD stage IV presented to the ER with dizziness and fall.  Dizzy and lightheadedness for about 2 and half weeks.  Fell on right hand Friday.  Does have significant prostatism and plan for cystoscopy next month.  In the emergency room hemodynamically stable.  On room air.  UA was abnormal.  MRI showed subacute stroke.  CT angiogram showed significant vertebral artery stenosis.  Admitted due to above.  Subjective: Patient seen and examined.  Daughter at the bedside.  He was urinating and was having a hard time straining.  Urine looked clear.  Right wrist is significantly tender on mobility.  Not mobilized yet.  He is ready to work with physical therapy. Assessment & Plan:   Subacute posterior circulation stroke, symptomatic. Clinical findings, 3 weeks of dizziness and disequilibrium. MRI of the brain, patchy early subacute ischemic infarct involving both cerebellar hemisphere right more than left. CT angiogram of the head and neck, occlusion of bilateral vertebral arteries, severe stenosis of the P2 segment of the right PCA and severe stenosis of the left supraclinoid ICA. 2D echocardiogram, pending today. Antiplatelet therapy, none at home.  Now started on aspirin  and Plavix  after loading dose. LDL 57.  Does not need treatment. Hemoglobin A1c, pending. Therapy recommendations, pending.  Likely home with home health PT OT. PT OT.  Neurology follow-up.  Fall with right wrist pain and swelling: X-ray negative for fracture or subluxation.  Has significant discomfort.  Will provide  right wrist splint.  Ice pack.  CKD stage IIIb: At about baseline.  Patient has received contrast studies.  Monitor.  Recheck tomorrow morning.  Acute UTI: Suspected present on admission.  Cultures has been collected.  This is likely his chronic issue.  Will continue Rocephin today but anticipate not needing further antibiotics on discharge.  Chronic medical issues including Hypertension, chronic combined heart failure Type 2 diabetes BPH on tamsulosin , planned procedure with urology. OSA on CPAP  Resume all home medications.    DVT prophylaxis: enoxaparin  (LOVENOX ) injection 30 mg Start: 09/02/23 2200   Code Status: DNR with limited intervention Family Communication: Daughter at the bedside Disposition Plan: Status is: Inpatient Remains inpatient appropriate because: Stroke workup, mobility     Consultants:  Neurology  Procedures:  None  Antimicrobials:  Rocephin 5/11---     Objective: Vitals:   09/02/23 2208 09/03/23 0026 09/03/23 0439 09/03/23 0817  BP: (!) 151/84 (!) 149/84 131/81 (!) 140/77  Pulse: 80 77 73 72  Resp: 18 18 18 16   Temp: 99.1 F (37.3 C) 98.6 F (37 C) 98.4 F (36.9 C) 98.6 F (37 C)  TempSrc: Oral Oral Oral Oral  SpO2: 97% 92% 94% 96%  Weight:      Height:        Intake/Output Summary (Last 24 hours) at 09/03/2023 1017 Last data filed at 09/03/2023 0500 Gross per 24 hour  Intake 460 ml  Output 150 ml  Net 310 ml   Filed Weights   09/02/23 1614  Weight: 97.5 kg    Examination:  General exam: Appears calm and comfortable.  Frail.  Chronically sick looking but not in any distress.  Pleasant to interaction. Respiratory  system: Clear to auscultation. Respiratory effort normal. Cardiovascular system: S1 & S2 heard, RRR. No JVD, murmurs, rubs, gallops or clicks. No pedal edema. Gastrointestinal system: Soft.  Nontender. Central nervous system: Alert and oriented. No focal neurological deficits.  Gross generalized  weakness. Extremities: Symmetric 5 x 5 power. Mildly tender right wrist with no point tenderness or bony deformity.    Data Reviewed: I have personally reviewed following labs and imaging studies  CBC: Recent Labs  Lab 09/02/23 1642 09/03/23 0715  WBC 7.9 7.0  NEUTROABS 6.0  --   HGB 12.4* 12.0*  HCT 38.3* 36.4*  MCV 101.6* 99.7  PLT 153 159   Basic Metabolic Panel: Recent Labs  Lab 09/02/23 1642 09/03/23 0715  NA 138 142  K 3.6 3.3*  CL 98 102  CO2 30 28  GLUCOSE 171* 142*  BUN 38* 35*  CREATININE 2.28* 2.28*  CALCIUM  9.7 9.6   GFR: Estimated Creatinine Clearance: 25.9 mL/min (A) (by C-G formula based on SCr of 2.28 mg/dL (H)). Liver Function Tests: Recent Labs  Lab 09/02/23 1642  AST 18  ALT 12  ALKPHOS 51  BILITOT 1.1  PROT 7.6  ALBUMIN  3.6   No results for input(s): "LIPASE", "AMYLASE" in the last 168 hours. No results for input(s): "AMMONIA" in the last 168 hours. Coagulation Profile: No results for input(s): "INR", "PROTIME" in the last 168 hours. Cardiac Enzymes: No results for input(s): "CKTOTAL", "CKMB", "CKMBINDEX", "TROPONINI" in the last 168 hours. BNP (last 3 results) No results for input(s): "PROBNP" in the last 8760 hours. HbA1C: No results for input(s): "HGBA1C" in the last 72 hours. CBG: Recent Labs  Lab 09/02/23 1613 09/02/23 2206 09/03/23 0634  GLUCAP 151* 180* 152*   Lipid Profile: Recent Labs    09/03/23 0715  CHOL 122  HDL 51  LDLCALC 57  TRIG 69  CHOLHDL 2.4   Thyroid  Function Tests: No results for input(s): "TSH", "T4TOTAL", "FREET4", "T3FREE", "THYROIDAB" in the last 72 hours. Anemia Panel: No results for input(s): "VITAMINB12", "FOLATE", "FERRITIN", "TIBC", "IRON", "RETICCTPCT" in the last 72 hours. Sepsis Labs: No results for input(s): "PROCALCITON", "LATICACIDVEN" in the last 168 hours.  No results found for this or any previous visit (from the past 240 hours).       Radiology Studies: CT ANGIO HEAD  NECK W WO CM Result Date: 09/02/2023 CLINICAL DATA:  Neuro deficit, concern for stroke/TIA. Dizziness for 2 weeks. Recent fall and hit front of head. EXAM: CT ANGIOGRAPHY HEAD AND NECK WITH AND WITHOUT CONTRAST TECHNIQUE: Multidetector CT imaging of the head and neck was performed using the standard protocol during bolus administration of intravenous contrast. Multiplanar CT image reconstructions and MIPs were obtained to evaluate the vascular anatomy. Carotid stenosis measurements (when applicable) are obtained utilizing NASCET criteria, using the distal internal carotid diameter as the denominator. RADIATION DOSE REDUCTION: This exam was performed according to the departmental dose-optimization program which includes automated exposure control, adjustment of the mA and/or kV according to patient size and/or use of iterative reconstruction technique. CONTRAST:  60mL OMNIPAQUE  IOHEXOL  350 MG/ML SOLN COMPARISON:  Same day CT head and cervical spine. FINDINGS: CTA NECK FINDINGS Aortic arch: Standard configuration of the aortic arch. Imaged portion shows no evidence of aneurysm or dissection. No significant stenosis of the major arch vessel origins. Pulmonary arteries: As permitted by contrast timing, there are no filling defects in the visualized pulmonary arteries. Subclavian arteries: The subclavian arteries are patent bilaterally. Right carotid system: No evidence of dissection, stenosis (50%  or greater), or occlusion. Tortuosity of the proximal common carotid artery. There is additional tortuosity throughout the internal carotid artery. Left carotid system: No evidence of dissection, stenosis (50% or greater), or occlusion. Minimal calcified atherosclerosis at the carotid bifurcation without stenosis. Mild tortuosity of the proximal common carotid artery. Additional tortuosity of the cervical ICA most pronounced distally. Vertebral arteries: The right vertebral artery origin is patent with abrupt occlusion of the  proximal right V1 segment. There is reconstitution of the distal V1 and proximal V2 segments which is severely diminished in caliber and appears irregular with additional occlusion of the right V2 segment near the level of C4. Irregular reconstitution of the distal V2 segment. Calcified atherosclerosis noted along the V3 and proximal V4 segments. Left vertebral artery origin is patent with diminished caliber and intraluminal contrast of the left V2 segment with occlusion at approximately the level of C4-5. The distal V2, V3, and V4 segments are occluded. Atherosclerosis along the proximal left V4 segment. Skeleton: No acute findings. Degenerative changes in the cervical spine. Disc space narrowing most pronounced at C6-7. Prominent anterior endplate osteophytes at multiple levels in the cervical spine. Edentulous maxilla. Other neck: The visualized airway is patent. No cervical lymphadenopathy. Upper chest: Visualized lung apices are clear. Review of the MIP images confirms the above findings CTA HEAD FINDINGS ANTERIOR CIRCULATION: The intracranial ICAs are patent bilaterally. Atherosclerosis of the bilateral carotid siphons resulting in moderate stenosis. There is additional severe stenosis of the left supraclinoid ICA. No proximal occlusion, aneurysm, or vascular malformation. MCAs: Patent bilaterally. Mild stenosis of the proximal M1 segment right MCA. Additional mild narrowing of a distal M2 segment of the right MCA. ACAs: The A1 segment of the left ACA is not visualized and may be stenotic secondary to atherosclerosis versus congenitally hypoplastic. Possible fenestration of the anterior communicating artery. The ACAs are patent distally. POSTERIOR CIRCULATION: Occlusion of the intracranial vertebral arteries as noted above. The basilar artery is patent but is diminished in caliber and irregular with moderate narrowing along the proximal aspect. The AICA is visualized on the left. The PICA are not well  visualized. Bilateral superior cerebellar arteries noted. The PCAs are patent bilaterally but appear small in caliber. Moderate to severe narrowing of the P2 segment right PCA significantly diminished caliber of distal right PCA branches without evidence of focal occlusion. There is additional mild irregularity of the proximal P2 segment on the left. Severe stenosis of the posterior P2 segment of the left PCA. Diminished caliber of distal left PCA branches without focal occlusion. Venous sinuses: As permitted by contrast timing, patent. Anatomic variants: None Review of the MIP images confirms the above findings IMPRESSION: Occlusion of the bilateral vertebral arteries as above. Irregular reconstitution of the distal V2 segment of the right vertebral artery with occlusion of the right V3 and V4 segments. The left vertebral artery is occluded at the V2 segment from the level of C4-5. Additional irregularity and moderate narrowing of the proximal basilar artery. The basilar artery is irregular throughout its course without occlusion. Diminutive caliber of the bilateral posterior cerebral arteries. Moderate to severe stenosis of the P2 segment right PCA. Additional severe stenosis of the posterior P2 segment of the left PCA. Severely diminished caliber of distal PCA branches bilaterally without evidence of focal occlusion. Atherosclerosis of the carotid siphons. Focal severe stenosis of the left supraclinoid ICA. Additional moderate stenosis of the bilateral cavernous ICAs. Tortuosity of the common carotid arteries and cervical internal carotid arteries which may be secondary  to chronic hypertension. Electronically Signed   By: Denny Flack M.D.   On: 09/02/2023 20:00   DG Wrist Complete Right Result Date: 09/02/2023 CLINICAL DATA:  Pain after fall.  Swelling. EXAM: RIGHT WRIST - COMPLETE 3+ VIEW COMPARISON:  None Available. FINDINGS: There is no evidence of fracture or dislocation. Scattered osteoarthritis. Mild  chondrocalcinosis. No erosive or bony destructive change. There is generalized subcutaneous edema. IMPRESSION: 1. Generalized subcutaneous edema. No fracture or subluxation of the right wrist. 2. Scattered osteoarthritis. Electronically Signed   By: Chadwick Colonel M.D.   On: 09/02/2023 18:25   MR BRAIN WO CONTRAST Result Date: 09/02/2023 CLINICAL DATA:  Initial evaluation for acute dizziness and vertigo. EXAM: MRI HEAD WITHOUT CONTRAST TECHNIQUE: Multiplanar, multiecho pulse sequences of the brain and surrounding structures were obtained without intravenous contrast. COMPARISON:  CT from 08/25/2023. FINDINGS: Brain: Diffuse prominence of the CSF containing spaces compatible with generalized cerebral atrophy, moderately advanced in nature. Patchy T2/FLAIR hyperintensity involving the supratentorial cerebral white matter, consistent with chronic small vessel ischemic disease, relatively mild for age. Patchy restricted diffusion seen involving the right greater than left cerebellar hemispheres (series 6, image 6). Associated T2/FLAIR signal abnormality with mild signal loss on ADC map, consistent with early subacute ischemic infarcts. Associated minimal petechial blood products at the right cerebellum without hemorrhagic transformation. No significant regional mass effect. These changes are superimposed on a few scattered underlying chronic cerebellar infarcts. No mass lesion, midline shift or mass effect. No hydrocephalus or extra-axial fluid collection. Pituitary gland within normal limits. Vascular: Irregular flow voids within the V4 segments bilaterally, which could reflect slow flow and/or occlusion. Anterior circulation flow voids maintained. Skull and upper cervical spine: Degenerative osteoarthritic changes about the C1-2 articulations without significant stenosis. Bone marrow signal intensity within normal limits. No scalp soft tissue abnormality. Sinuses/Orbits: Prior bilateral ocular lens replacement.  Paranasal sinuses are clear. No mastoid effusion. Other: None. IMPRESSION: 1. Patchy early subacute ischemic infarcts involving the right greater than left cerebellar hemispheres. Associated minimal petechial blood products at the right cerebellum without hemorrhagic transformation or significant regional mass effect. 2. Irregular flow voids within the V4 segments bilaterally, which could reflect slow flow and/or occlusion. Correlation with dedicated vascular imaging recommended. 3. Underlying moderately advanced cerebral atrophy with mild chronic small vessel ischemic disease. Electronically Signed   By: Virgia Griffins M.D.   On: 09/02/2023 18:12   CT Head Wo Contrast Result Date: 09/02/2023 CLINICAL DATA:  Fall 2 days ago, dizziness EXAM: CT HEAD WITHOUT CONTRAST CT CERVICAL SPINE WITHOUT CONTRAST TECHNIQUE: Multidetector CT imaging of the head and cervical spine was performed following the standard protocol without intravenous contrast. Multiplanar CT image reconstructions of the cervical spine were also generated. RADIATION DOSE REDUCTION: This exam was performed according to the departmental dose-optimization program which includes automated exposure control, adjustment of the mA and/or kV according to patient size and/or use of iterative reconstruction technique. COMPARISON:  CT brain, 08/25/2023, same-day MR brain FINDINGS: CT HEAD FINDINGS Brain: Hypodense lesions in the cerebellar hemispheres, measuring 3.4 x 3.2 cm on the right (series 3, image 7) and 0.8 cm and 1.8 x 1.2 cm on the left (series 3, image 8). Vascular: No hyperdense vessel or unexpected calcification. Skull: Normal. Negative for fracture or focal lesion. Sinuses/Orbits: No acute finding. Other: None. CT CERVICAL SPINE FINDINGS Alignment: Straightening of the normal cervical lordosis. Skull base and vertebrae: No acute fracture. No primary bone lesion or focal pathologic process. Soft tissues and spinal canal: No prevertebral  fluid  or swelling. No visible canal hematoma. Disc levels: Mild-to-moderate multilevel cervical disc degenerative disease throughout the cervical spine. Upper chest: Negative. Other: None. IMPRESSION: 1. Hypodense lesions in the cerebellar hemispheres, measuring 3.4 x 3.2 cm on the right and 0.8 cm and 1.8 x 1.2 cm on the left. Please see forthcoming MR for further characterization. 2. No acute fracture or static subluxation of the cervical spine. 3. Mild-to-moderate multilevel cervical disc degenerative disease throughout the cervical spine. Electronically Signed   By: Fredricka Jenny M.D.   On: 09/02/2023 17:16   CT Cervical Spine Wo Contrast Result Date: 09/02/2023 CLINICAL DATA:  Fall 2 days ago, dizziness EXAM: CT HEAD WITHOUT CONTRAST CT CERVICAL SPINE WITHOUT CONTRAST TECHNIQUE: Multidetector CT imaging of the head and cervical spine was performed following the standard protocol without intravenous contrast. Multiplanar CT image reconstructions of the cervical spine were also generated. RADIATION DOSE REDUCTION: This exam was performed according to the departmental dose-optimization program which includes automated exposure control, adjustment of the mA and/or kV according to patient size and/or use of iterative reconstruction technique. COMPARISON:  CT brain, 08/25/2023, same-day MR brain FINDINGS: CT HEAD FINDINGS Brain: Hypodense lesions in the cerebellar hemispheres, measuring 3.4 x 3.2 cm on the right (series 3, image 7) and 0.8 cm and 1.8 x 1.2 cm on the left (series 3, image 8). Vascular: No hyperdense vessel or unexpected calcification. Skull: Normal. Negative for fracture or focal lesion. Sinuses/Orbits: No acute finding. Other: None. CT CERVICAL SPINE FINDINGS Alignment: Straightening of the normal cervical lordosis. Skull base and vertebrae: No acute fracture. No primary bone lesion or focal pathologic process. Soft tissues and spinal canal: No prevertebral fluid or swelling. No visible canal hematoma.  Disc levels: Mild-to-moderate multilevel cervical disc degenerative disease throughout the cervical spine. Upper chest: Negative. Other: None. IMPRESSION: 1. Hypodense lesions in the cerebellar hemispheres, measuring 3.4 x 3.2 cm on the right and 0.8 cm and 1.8 x 1.2 cm on the left. Please see forthcoming MR for further characterization. 2. No acute fracture or static subluxation of the cervical spine. 3. Mild-to-moderate multilevel cervical disc degenerative disease throughout the cervical spine. Electronically Signed   By: Fredricka Jenny M.D.   On: 09/02/2023 17:16        Scheduled Meds:   stroke: early stages of recovery book   Does not apply Once   aspirin  EC  81 mg Oral Daily   cholecalciferol  1,000 Units Oral q AM   [START ON 09/04/2023] clopidogrel   75 mg Oral Daily   cyanocobalamin   1,000 mcg Oral q morning   enoxaparin  (LOVENOX ) injection  30 mg Subcutaneous Q24H   insulin  aspart  0-15 Units Subcutaneous TID WC   insulin  aspart  0-5 Units Subcutaneous QHS   potassium chloride  SA  40 mEq Oral Daily   tamsulosin   0.4 mg Oral BID   torsemide   60 mg Oral BID   Continuous Infusions:  cefTRIAXone (ROCEPHIN)  IV Stopped (09/02/23 1847)     LOS: 1 day     Vada Garibaldi, MD [facility]

## 2023-09-03 NOTE — Plan of Care (Signed)

## 2023-09-03 NOTE — TOC Initial Note (Signed)
 Transition of Care [facility]) - Initial/Assessment Note    Patient Details  Name: Allen Dyer MRN: [id number] Date of Birth: 10-13-1935  [facility] CM/SW Contact:    Jonathan Neighbor, RN Phone Number: 09/03/2023, 12:17 PM  Clinical Narrative:                  Pt is from home with his daughter and her family. Someone is with him most of the time.  Daughter provides needed transportation.  Pt manages his own medications and denies any issues.  Current recommendations are for CIR. Awaiting eval.  TOC following.  Expected Discharge Plan: IP Rehab Facility Barriers to Discharge: Continued Medical Work up   Patient Goals and CMS Choice   CMS Medicare.gov Compare Post Acute Care list provided to:: Patient Choice offered to / list presented to : Patient, Adult Children      Expected Discharge Plan and Services   Discharge Planning Services: CM Consult Post Acute Care Choice: IP Rehab Living arrangements for the past 2 months: Single Family Home                                      Prior Living Arrangements/Services Living arrangements for the past 2 months: Single Family Home Lives with:: Adult Children Patient language and need for interpreter reviewed:: Yes Do you feel safe going back to the place where you live?: Yes        Care giver support system in place?: Yes (comment) Current home services: DME (cane) Criminal Activity/Legal Involvement Pertinent to Current Situation/Hospitalization: No - Comment as needed  Activities of Daily Living   ADL Screening (condition at time of admission) Independently performs ADLs?: No Does the patient have a NEW difficulty with bathing/dressing/toileting/self-feeding that is expected to last >3 days?: No Does the patient have a NEW difficulty with getting in/out of bed, walking, or climbing stairs that is expected to last >3 days?: Yes (Initiates electronic notice to provider for possible PT consult) Does the  patient have a NEW difficulty with communication that is expected to last >3 days?: No Is the patient deaf or have difficulty hearing?: No Does the patient have difficulty seeing, even when wearing glasses/contacts?: No Does the patient have difficulty concentrating, remembering, or making decisions?: No  Permission Sought/Granted                  Emotional Assessment Appearance:: Appears stated age   Affect (typically observed): Quiet Orientation: : Oriented to Self, Oriented to Place, Oriented to  Time, Oriented to Situation   Psych Involvement: No (comment)  Admission diagnosis:  CVA (cerebrovascular accident) (HCC) [I63.9] Urinary tract infection without hematuria, site unspecified [N39.0] Cerebrovascular accident (CVA), unspecified mechanism (HCC) [I63.9] Patient Active Problem List   Diagnosis Date Noted   CVA (cerebrovascular accident) (HCC) 09/02/2023   Cerebellar stroke (HCC) 09/02/2023   Urinary tract infection without hematuria 09/02/2023   Fall at home, initial encounter 09/02/2023   Dizziness 09/02/2023   Vertigo of central origin 09/02/2023   Porokeratosis 08/29/2022   Acute CVA (cerebrovascular accident) (HCC) 09/11/2021   DNR (do not resuscitate) 09/11/2021   Peripheral vascular disease (HCC) 03/30/2021   Type 2 diabetes mellitus with stage 4 chronic kidney disease, without long-term current use of insulin  (HCC) 06/21/2020   Type 2 diabetes mellitus with hyperglycemia, without long-term current use of insulin  (HCC) 06/21/2020   Deficiency anemia 12/09/2019  Mild chronic anemia 03/06/2019   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   Pain due to onychomycosis of toenails of both feet 10/18/2018   Type 2 diabetes mellitus with vascular disease (HCC) 10/18/2018   Acute on chronic combined systolic and diastolic CHF (congestive heart failure) (HCC)    Acute on chronic congestive heart failure (HCC) 07/04/2018   CHF exacerbation (HCC) 07/03/2018   AKI (acute  kidney injury) (HCC) 07/03/2018   Acute CHF (congestive heart failure) (HCC) 06/18/2018   NICM (nonischemic cardiomyopathy) (HCC)    Pulmonary hypertension (HCC)    Essential hypertension 06/14/2018   Type 2 diabetes mellitus (HCC)    History of prostate cancer    Rheumatic fever    Diabetic peripheral neuropathy associated with type 2 diabetes mellitus (HCC)    Lower extremity edema    Chronic kidney disease (CKD), stage IV (severe) (HCC)    Hyperlipidemia    Chronic combined systolic (congestive) and diastolic (congestive) heart failure (HCC)    PCP:  Lanae Pinal, MD Pharmacy:   [id number] [place], [place] - [phone] AT [facility] OF [facility] & [address] [place] [address] Phone: [phone] Fax: [phone]     Social Drivers of Health (SDOH) Social History: SDOH Screenings   Food Insecurity: No Food Insecurity (09/02/2023)  Housing: Low Risk  (09/02/2023)  Transportation Needs: No Transportation Needs (09/02/2023)  Utilities: Not At Risk (09/02/2023)  Depression (PHQ2-9): Low Risk  (08/16/2023)  Social Connections: Moderately Integrated (09/02/2023)  Tobacco Use: Medium Risk (09/02/2023)  Health Literacy: Adequate Health Literacy (01/24/2023)   SDOH Interventions:     Readmission Risk Interventions     No data to display

## 2023-09-04 ENCOUNTER — Inpatient Hospital Stay (HOSPITAL_COMMUNITY)

## 2023-09-04 DIAGNOSIS — I63013 Cerebral infarction due to thrombosis of bilateral vertebral arteries: Secondary | ICD-10-CM | POA: Diagnosis not present

## 2023-09-04 DIAGNOSIS — Z7984 Long term (current) use of oral hypoglycemic drugs: Secondary | ICD-10-CM

## 2023-09-04 LAB — BASIC METABOLIC PANEL WITH GFR
Anion gap: 13 (ref 5–15)
BUN: 38 mg/dL — ABNORMAL HIGH (ref 8–23)
CO2: 26 mmol/L (ref 22–32)
Calcium: 9 mg/dL (ref 8.9–10.3)
Chloride: 101 mmol/L (ref 98–111)
Creatinine, Ser: 2.71 mg/dL — ABNORMAL HIGH (ref 0.61–1.24)
GFR, Estimated: 22 mL/min — ABNORMAL LOW (ref >60)
Glucose, Bld: 141 mg/dL — ABNORMAL HIGH (ref 70–99)
Potassium: 3.6 mmol/L (ref 3.5–5.1)
Sodium: 140 mmol/L (ref 135–145)

## 2023-09-04 LAB — GLUCOSE, CAPILLARY
Glucose-Capillary: 139 mg/dL — ABNORMAL HIGH (ref 70–99)
Glucose-Capillary: 201 mg/dL — ABNORMAL HIGH (ref 70–99)
Glucose-Capillary: 202 mg/dL — ABNORMAL HIGH (ref 70–99)
Glucose-Capillary: 174 mg/dL — ABNORMAL HIGH (ref 70–99)

## 2023-09-04 LAB — URINE CULTURE: Culture: >=100000 — AB

## 2023-09-04 MED ORDER — CEFADROXIL 500 MG PO CAPS
500.0000 mg | ORAL_CAPSULE | Freq: Two times a day (BID) | ORAL | Status: DC
  Administered 2023-09-04 – 2023-09-05 (×3): 500 mg via ORAL
  Filled 2023-09-04 (×4): qty 1

## 2023-09-04 NOTE — Progress Notes (Signed)
 Inpatient Rehab Coordinator Note:  I met with patient at bedside to discuss CIR recommendations and goals/expectations of CIR stay.  We reviewed 3 hrs/day of therapy, physician follow up, and average length of stay 2 weeks (dependent upon progress) with goals of supervision to mod I.  Pt does not appear to demonstrate any significant cognitive deficits with basic demographic info or intellectual awareness.  He states he lives with his daughter who is home during the day and can provide assist.  I will confirm with her.  We discussed insurance coverage and he notes he has Medicare A/B and [employer] for Life.  I do not have beds available for this patient to admit to CIR today but will follow for potential admit in the next few days pending bed availability.   Loye Rumble, PT, DPT Admissions Coordinator [phone] 09/04/23  1:14 PM

## 2023-09-04 NOTE — Plan of Care (Signed)
  Problem: Education: Goal: Knowledge of disease or condition will improve 09/04/2023 1720 by Corlis Dienes, RN Outcome: Progressing 09/04/2023 1720 by Corlis Dienes, RN Outcome: Progressing 09/04/2023 1151 by Corlis Dienes, RN Outcome: Progressing

## 2023-09-04 NOTE — Progress Notes (Addendum)
 STROKE TEAM PROGRESS NOTE    INTERIM HISTORY/SUBJECTIVE No family at the bedside. Patient sitting in the chair in NAD.  No new neurological events overnight  CBC    Component Value Date/Time   WBC 7.0 09/03/2023 0715   RBC 3.65 (L) 09/03/2023 0715   HGB 12.0 (L) 09/03/2023 0715   HGB 11.0 (L) 10/10/2018 1404   HGB 11.0 (L) 06/14/2018 1222   HCT 36.4 (L) 09/03/2023 0715   HCT 33.9 (L) 06/14/2018 1222   PLT 159 09/03/2023 0715   PLT 176 10/10/2018 1404   PLT 214 06/14/2018 1222   MCV 99.7 09/03/2023 0715   MCV 92 06/14/2018 1222   MCH 32.9 09/03/2023 0715   MCHC 33.0 09/03/2023 0715   RDW 13.6 09/03/2023 0715   RDW 14.7 06/14/2018 1222   LYMPHSABS 0.8 09/02/2023 1642   MONOABS 1.1 (H) 09/02/2023 1642   EOSABS 0.0 09/02/2023 1642   BASOSABS 0.0 09/02/2023 1642    BMET    Component Value Date/Time   NA 140 09/04/2023 0558   NA 143 07/01/2018 1233   K 3.6 09/04/2023 0558   CL 101 09/04/2023 0558   CO2 26 09/04/2023 0558   GLUCOSE 141 (H) 09/04/2023 0558   BUN 38 (H) 09/04/2023 0558   BUN 27 07/01/2018 1233   CREATININE 2.71 (H) 09/04/2023 0558   CREATININE 2.06 (H) 10/10/2018 1404   CALCIUM  9.0 09/04/2023 0558   GFRNONAA 22 (L) 09/04/2023 0558   GFRNONAA 29 (L) 10/10/2018 1404    IMAGING past 24 hours ECHOCARDIOGRAM COMPLETE Result Date: 09/03/2023    ECHOCARDIOGRAM REPORT   Patient Name:   Allen Dyer Date of Exam: 09/03/2023 Medical Rec #:  [id number]     Height:       67.0 in Accession #:    [id number]    Weight:       214.9 lb Date of Birth:  [birth year]     BSA:          2.085 m Patient Age:    88 years      BP:           131/81 mmHg Patient Gender: M             HR:           67 bpm. Exam Location:  Inpatient Procedure: 2D Echo, Color Doppler, Cardiac Doppler and Intracardiac            Opacification Agent (Both Spectral and Color Flow Doppler were            utilized during procedure). Indications:    Stroke I63.9  History:        Patient has prior history of  Echocardiogram examinations, most                 recent 09/12/2021.  Sonographer:    Hersey Lorenzo RDCS Referring Phys: [phone] PROSPER M AMPONSAH IMPRESSIONS  1. Left ventricular ejection fraction, by estimation, is 25 to 30%. Left ventricular ejection fraction by 2D MOD biplane is 30.8 %. The left ventricle has severely decreased function. The left ventricle demonstrates global hypokinesis. There is moderate  concentric left ventricular hypertrophy. Left ventricular diastolic parameters are consistent with Grade I diastolic dysfunction (impaired relaxation).  2. Right ventricular systolic function is normal. The right ventricular size is normal. Tricuspid regurgitation signal is inadequate for assessing PA pressure.  3. Left atrial size was mildly dilated.  4. The mitral valve is normal in structure. Trivial mitral valve regurgitation.  No evidence of mitral stenosis.  5. The aortic valve is tricuspid. There is mild calcification of the aortic valve. Aortic valve regurgitation is mild.  6. Aortic dilatation noted. There is mild dilatation of the aortic root, measuring 40 mm. There is mild dilatation of the ascending aorta, measuring 43 mm.  7. The inferior vena cava is normal in size with greater than 50% respiratory variability, suggesting right atrial pressure of 3 mmHg. FINDINGS  Left Ventricle: Left ventricular ejection fraction, by estimation, is 25 to 30%. Left ventricular ejection fraction by 2D MOD biplane is 30.8 %. The left ventricle has severely decreased function. The left ventricle demonstrates global hypokinesis. The left ventricular internal cavity size was normal in size. There is moderate concentric left ventricular hypertrophy. Left ventricular diastolic parameters are consistent with Grade I diastolic dysfunction (impaired relaxation). Right Ventricle: The right ventricular size is normal. No increase in right ventricular wall thickness. Right ventricular systolic function is normal. Tricuspid  regurgitation signal is inadequate for assessing PA pressure. Left Atrium: Left atrial size was mildly dilated. Right Atrium: Right atrial size was normal in size. Pericardium: There is no evidence of pericardial effusion. Mitral Valve: The mitral valve is normal in structure. Trivial mitral valve regurgitation. No evidence of mitral valve stenosis. Tricuspid Valve: The tricuspid valve is normal in structure. Tricuspid valve regurgitation is trivial. Aortic Valve: The aortic valve is tricuspid. There is mild calcification of the aortic valve. Aortic valve regurgitation is mild. Aortic regurgitation PHT measures 851 msec. Pulmonic Valve: The pulmonic valve was normal in structure. Pulmonic valve regurgitation is not visualized. Aorta: Aortic dilatation noted. There is mild dilatation of the aortic root, measuring 40 mm. There is mild dilatation of the ascending aorta, measuring 43 mm. Venous: The inferior vena cava is normal in size with greater than 50% respiratory variability, suggesting right atrial pressure of 3 mmHg. IAS/Shunts: No atrial level shunt detected by color flow Doppler.  LEFT VENTRICLE PLAX 2D                        Biplane EF (MOD) LVIDd:         5.20 cm         LV Biplane EF:   Left LVIDs:         4.50 cm                          ventricular LV PW:         1.10 cm                          ejection LV IVS:        1.10 cm                          fraction by LVOT diam:     2.60 cm                          2D MOD LV SV:         66                               biplane is LV SV Index:   32  30.8 %. LVOT Area:     5.31 cm                                Diastology                                LV e' lateral:   3.41 cm/s LV Volumes (MOD)               LV E/e' lateral: 14.1 LV vol d, MOD    157.0 ml A2C: LV vol d, MOD    199.0 ml A4C: LV vol s, MOD    110.0 ml A2C: LV vol s, MOD    134.0 ml A4C: LV SV MOD A2C:   47.0 ml LV SV MOD A4C:   199.0 ml LV SV MOD BP:    54.5 ml RIGHT  VENTRICLE             IVC RV S prime:     11.10 cm/s  IVC diam: 1.20 cm TAPSE (M-mode): 2.2 cm LEFT ATRIUM           Index        RIGHT ATRIUM           Index LA diam:      2.30 cm 1.10 cm/m   RA Area:     15.80 cm LA Vol (A4C): 40.3 ml 19.33 ml/m  RA Volume:   40.60 ml  19.47 ml/m  AORTIC VALVE LVOT Vmax:   75.90 cm/s LVOT Vmean:  56.200 cm/s LVOT VTI:    0.124 m AI PHT:      851 msec  AORTA Ao Root diam: 4.00 cm Ao Asc diam:  4.30 cm MITRAL VALVE MV Area (PHT): 4.08 cm    SHUNTS MV Decel Time: 186 msec    Systemic VTI:  0.12 m MV E velocity: 48.00 cm/s  Systemic Diam: 2.60 cm MV A velocity: 67.40 cm/s MV E/A ratio:  0.71 Dalton McleanMD Electronically signed by Archer Bear Signature Date/Time: 09/03/2023/7:34:48 PM    Final     Vitals:   09/04/23 0350 09/04/23 0437 09/04/23 0821 09/04/23 1147  BP:  116/69 134/70 114/66  Pulse:  79 83 72  Resp:  18 20 20   Temp:  99.4 F (37.4 C) 98.7 F (37.1 C) 99.3 F (37.4 C)  TempSrc:  Oral Oral Oral  SpO2: 98% 94%  100%  Weight:      Height:         PHYSICAL EXAM General:  Alert, well-nourished, well-developed patient in no acute distress Psych:  Mood and affect appropriate for situation CV: Regular rate and rhythm on monitor Respiratory:  Regular, unlabored respirations on room air GI: Abdomen soft and nontender   NEURO:  Mental Status: AA&Ox3, patient is able to give clear and coherent history Speech/Language: speech is without aphasia.  Mild dysarthria. Naming, repetition, fluency, and comprehension intact.  Cranial Nerves:  II: PERRL. Visual fields full.  III, IV, VI: EOMI. Eyelids elevate symmetrically.  V: Sensation is intact to light touch and symmetrical to face.  VII: Face is symmetrical resting and smiling VIII: hearing intact to voice. IX, X: Palate elevates symmetrically. Phonation is normal.  EA:[id number] shrug 5/5. XII: tongue is midline without fasciculations. Motor: 5/5 strength to all muscle groups tested.  Right  arm is limited due to pain and is in a  wrist brace Tone: is normal and bulk is normal Sensation- Intact to light touch bilaterally. Extinction absent to light touch to DSS.   Coordination: FTN intact bilaterally, HKS: no ataxia in BLE.No drift.  Gait- deferred  Most Recent NIH 1   ASSESSMENT/PLAN  Allen Dyer is a 88 y.o. male with history of hypertension, hyperlipidemia, CKD, CHF, CAD, nonischemic cardiomyopathy, multiple myeloma, prostate cancer, prior stroke, diabetes, vertigo, peripheral neuropathy, admitted for unsteady gait and experiencing a fall.  NIH on Admission 1  Stroke: Bilateral cerebellar infarct, right more than left, etiology: Large vessel disease from severe posterior circulation stenosis  CT head  Hypodense lesions in the cerebellar hemispheres, measuring 3.4 x3.2 cm on the right and 0.8 cm and 1.8 x 1.2 cm on the left. CTA head & neck Occlusion of the bilateral vertebral arteries. Irregular reconstitution of the distal V2 segment of the right vertebral artery with occlusion of the right V3 and V4 segments. The left vertebral artery is occluded at the V2 segment from the level of C4-5.  irregularity and moderate narrowing of the proximal basilar artery. The basilar artery is irregular throughout its course without occlusion. Moderate to severe stenosis of the P2 segment right PCA. Additional severe stenosis of the posterior P2 segment of the left PCA. MRI   Patchy early subacute ischemic infarcts involving the right greater than left cerebellar hemispheres.  2D Echo EF 25 to 30% LDL 57 HgbA1c 7.1 VTE prophylaxis -Lovenox  aspirin  81 mg daily prior to admission, now on aspirin  81 mg daily and clopidogrel  75 mg daily for 3 months and then Plavix  alone. Therapy recommendations:  CIR Disposition: Pending  CHF Cardiomyopathy 2D echo EF 35 to 40% in 08/2021 This admission EF 25 to 30% On torsemide  No cardiac complains so far, will need close cardiology follow up with  Dr. Anner Kill and Dr. Mitzie Anda as outpt - discussed with Dr. Hilton Lucky.  Hypertension Home meds: Carvedilol  6.25 mg, Imdur  30 mg, torsemide  20 mg Stable Blood Pressure Goal: BP less than 180/105  Long-term BP goal normotensive  Lipid management Home meds: None LDL 57, goal < 70 No statin indicated given LDL at goal  Diabetes type II UnControlled Home meds: Glipizide  5 mg HgbA1c 7.1, goal < 7.0 CBGs SSI Recommend close follow-up with PCP for better DM control  Other Stroke Risk Factors Obesity, Body mass index is 33.67 kg/m., BMI >/= 30 associated with increased stroke risk, recommend weight loss, diet and exercise as appropriate   Other Active Problems CKD Acute UTI, on Rocephin BPH CKD 3B, creatinine 2.28 Right wrist pain due to fall  Hospital day # 2  Jonette Nestle DNP, ACNPC-AG  [facility]  ATTENDING NOTE: I reviewed above note and agree with the assessment and plan. Pt was seen and examined.   No family at the bedside. Pt sitting in chair, initially napping but easily arousable and neuro stable. Pending CIR placement. On DAPT. Will need close outpt cardiology follow up for worsening EF. Aggressive stroke risk factor modification.   For detailed assessment and plan, please refer to above as I have made changes wherever appropriate.   Neurology will sign off. Please call with questions. Pt will follow up with Dr. Salli Crawley at [facility] in about 4 weeks. Thanks for the consult.  Consuelo Denmark, MD PhD Stroke Neurology 09/04/2023 4:41 PM    To contact Stroke Continuity provider, please refer to [email or web address]. After hours, contact General Neurology

## 2023-09-04 NOTE — TOC Progression Note (Signed)
 Transition of Care [facility]) - Progression Note    Patient Details  Name: Allen Dyer MRN: [id number] Date of Birth: 06-Apr-1936  [facility] CM/SW Contact  Tandy Fam, [place] Phone Number: 09/04/2023, 2:43 PM  Clinical Narrative:   CSW updated by Rehab Admissions that patient is a rehab candidate, but unsure about bed availability. CSW met with patient and daughter at bedside to discuss possibility of sending referral to another AIR to possibly find a rehab bed quicker, but patient not interested; patient wants to wait for a bed at CIR. CSW to follow.    Expected Discharge Plan: IP Rehab Facility Barriers to Discharge: Continued Medical Work up  Expected Discharge Plan and Services   Discharge Planning Services: CM Consult Post Acute Care Choice: IP Rehab Living arrangements for the past 2 months: Single Family Home                                       Social Determinants of Health (SDOH) Interventions SDOH Screenings   Food Insecurity: No Food Insecurity (09/02/2023)  Housing: Low Risk  (09/02/2023)  Transportation Needs: No Transportation Needs (09/02/2023)  Utilities: Not At Risk (09/02/2023)  Depression (PHQ2-9): Low Risk  (08/16/2023)  Social Connections: Moderately Integrated (09/02/2023)  Tobacco Use: Medium Risk (09/02/2023)  Health Literacy: Adequate Health Literacy (01/24/2023)    Readmission Risk Interventions     No data to display

## 2023-09-04 NOTE — Plan of Care (Signed)

## 2023-09-04 NOTE — Progress Notes (Signed)
 PROGRESS NOTE    Allen Dyer  NWG:[id number] DOB: [birth year] DOA: 09/02/2023 PCP: Lanae Pinal, MD    Brief Narrative:  88 year old with history of chronic combined heart failure, nonischemic cardiomyopathy known ejection fraction 35%, stroke, prostate cancer status postradiation therapy and BPH, GERD, multiple myeloma, nonobstructive coronary artery disease, hypertension hyperlipidemia and type 2 diabetes, CKD stage IV presented to the ER with dizziness and fall.  Dizzy and lightheadedness for about 2 and half weeks.  Fell on right hand Friday.  Does have significant prostatism and plan for cystoscopy next month.  In the emergency room hemodynamically stable.  On room air.  UA was abnormal.  MRI showed subacute stroke.  CT angiogram showed significant vertebral artery stenosis.  Admitted due to above.  Subjective: Patient seen and examined.  No overnight events.  Wrist pain better with splint.  Today he has some pain on his elbow with no erythema or swelling.  Will check elbow x-ray.  Ready to go to rehab.   Assessment & Plan:   Subacute posterior circulation stroke, symptomatic. Clinical findings, 3 weeks of dizziness and disequilibrium. MRI of the brain, patchy early subacute ischemic infarct involving both cerebellar hemisphere right more than left. CT angiogram of the head and neck, occlusion of bilateral vertebral arteries, severe stenosis of the P2 segment of the right PCA and severe stenosis of the left supraclinoid ICA. 2D echocardiogram, pending today. Antiplatelet therapy, none at home.  Now started on aspirin  and Plavix  after loading dose. LDL 57.  Does not need treatment. Hemoglobin A1c, 7 Therapy recommendations, CIR. PT OT.  Neurology follow-up.  Fall with right wrist pain and swelling: X-ray negative for fracture or subluxation.  Has significant discomfort.  Will provide right wrist splint.  Ice pack. Check x-ray of the elbow.  Continue full range of motion  mobility.  CKD stage IIIb: At about baseline.    Acute UTI: Present on admission.  Cultures growing Klebsiella.  Received 2 days of Rocephin.  Will treat as complicated UTI with total 7 days of antibiotic therapy.  Will change to oral cephalosporin today.    Chronic medical issues including Hypertension, chronic combined heart failure Type 2 diabetes BPH on tamsulosin , planned procedure with urology. OSA on CPAP  Resume all home medications.    DVT prophylaxis: enoxaparin  (LOVENOX ) injection 30 mg Start: 09/02/23 2200   Code Status: DNR with limited intervention Family Communication: None today. Disposition Plan: Status is: Inpatient Remains inpatient appropriate because: Medically stable.  Needs CIR.     Consultants:  Neurology  Procedures:  None  Antimicrobials:  Rocephin 5/11--- 5/13.     Objective: Vitals:   09/03/23 2357 09/04/23 0350 09/04/23 0437 09/04/23 0821  BP: 116/81  116/69 134/70  Pulse: 80  79 83  Resp: 18  18 20   Temp: 99.4 F (37.4 C)  99.4 F (37.4 C) 98.7 F (37.1 C)  TempSrc: Oral  Oral Oral  SpO2: 97% 98% 94%   Weight:      Height:        Intake/Output Summary (Last 24 hours) at 09/04/2023 1100 Last data filed at 09/04/2023 0600 Gross per 24 hour  Intake 720 ml  Output 1200 ml  Net -480 ml   Filed Weights   09/02/23 1614  Weight: 97.5 kg    Examination:  General exam: Appears calm and comfortable.  Frail. Pleasant to interaction. Respiratory system: Clear to auscultation. Respiratory effort normal. Cardiovascular system: S1 & S2 heard, RRR. No JVD, murmurs, rubs, gallops or  clicks. No pedal edema. Gastrointestinal system: Soft.  Nontender. Central nervous system: Alert and oriented. No focal neurological deficits.  Gross generalized weakness. Extremities: Symmetric 5 x 5 power. Mildly tender right wrist with no point tenderness or bony deformity. Mildly tender right elbow, no visible erythema or swelling.  Full range of  motion present.    Data Reviewed: I have personally reviewed following labs and imaging studies  CBC: Recent Labs  Lab 09/02/23 1642 09/03/23 0715  WBC 7.9 7.0  NEUTROABS 6.0  --   HGB 12.4* 12.0*  HCT 38.3* 36.4*  MCV 101.6* 99.7  PLT 153 159   Basic Metabolic Panel: Recent Labs  Lab 09/02/23 1642 09/03/23 0715 09/04/23 0558  NA 138 142 140  K 3.6 3.3* 3.6  CL 98 102 101  CO2 30 28 26   GLUCOSE 171* 142* 141*  BUN 38* 35* 38*  CREATININE 2.28* 2.28* 2.71*  CALCIUM  9.7 9.6 9.0   GFR: Estimated Creatinine Clearance: 21.8 mL/min (A) (by C-G formula based on SCr of 2.71 mg/dL (H)). Liver Function Tests: Recent Labs  Lab 09/02/23 1642  AST 18  ALT 12  ALKPHOS 51  BILITOT 1.1  PROT 7.6  ALBUMIN  3.6   No results for input(s): "LIPASE", "AMYLASE" in the last 168 hours. No results for input(s): "AMMONIA" in the last 168 hours. Coagulation Profile: No results for input(s): "INR", "PROTIME" in the last 168 hours. Cardiac Enzymes: No results for input(s): "CKTOTAL", "CKMB", "CKMBINDEX", "TROPONINI" in the last 168 hours. BNP (last 3 results) No results for input(s): "PROBNP" in the last 8760 hours. HbA1C: Recent Labs    09/03/23 0715  HGBA1C 7.0*   CBG: Recent Labs  Lab 09/03/23 0634 09/03/23 1200 09/03/23 1624 09/03/23 2132 09/04/23 0625  GLUCAP 152* 169* 141* 148* 139*   Lipid Profile: Recent Labs    09/03/23 0715  CHOL 122  HDL 51  LDLCALC 57  TRIG 69  CHOLHDL 2.4   Thyroid  Function Tests: No results for input(s): "TSH", "T4TOTAL", "FREET4", "T3FREE", "THYROIDAB" in the last 72 hours. Anemia Panel: No results for input(s): "VITAMINB12", "FOLATE", "FERRITIN", "TIBC", "IRON", "RETICCTPCT" in the last 72 hours. Sepsis Labs: No results for input(s): "PROCALCITON", "LATICACIDVEN" in the last 168 hours.  Recent Results (from the past 240 hours)  Urine Culture     Status: Abnormal   Collection Time: 09/02/23  4:57 PM   Specimen: Urine, Clean  Catch  Result Value Ref Range Status   Specimen Description   Final    URINE, CLEAN CATCH Performed at [facility], [address] [place], [place] [address]    Special Requests   Final    NONE Performed at [facility], [address] [place], [place] [address]    Culture >=100,000 COLONIES/mL KLEBSIELLA OXYTOCA (A)  Final   Report Status 09/04/2023 FINAL  Final   Organism ID, Bacteria KLEBSIELLA OXYTOCA (A)  Final      Susceptibility   Klebsiella oxytoca - MIC*    AMPICILLIN >=32 RESISTANT Resistant     CEFEPIME <=0.12 SENSITIVE Sensitive     CEFTRIAXONE <=0.25 SENSITIVE Sensitive     CIPROFLOXACIN <=0.25 SENSITIVE Sensitive     GENTAMICIN <=1 SENSITIVE Sensitive     IMIPENEM <=0.25 SENSITIVE Sensitive     NITROFURANTOIN <=16 SENSITIVE Sensitive     TRIMETH/SULFA <=20 SENSITIVE Sensitive     AMPICILLIN/SULBACTAM 8 SENSITIVE Sensitive     PIP/TAZO <=4 SENSITIVE Sensitive ug/mL    * >=100,000 COLONIES/mL KLEBSIELLA OXYTOCA  Radiology Studies: ECHOCARDIOGRAM COMPLETE Result Date: 09/03/2023    ECHOCARDIOGRAM REPORT   Patient Name:   Allen Dyer Date of Exam: 09/03/2023 Medical Rec #:  [id number]     Height:       67.0 in Accession #:    [id number]    Weight:       214.9 lb Date of Birth:  04-28-1935     BSA:          2.085 m Patient Age:    88 years      BP:           131/81 mmHg Patient Gender: M             HR:           67 bpm. Exam Location:  Inpatient Procedure: 2D Echo, Color Doppler, Cardiac Doppler and Intracardiac            Opacification Agent (Both Spectral and Color Flow Doppler were            utilized during procedure). Indications:    Stroke I63.9  History:        Patient has prior history of Echocardiogram examinations, most                 recent 09/12/2021.  Sonographer:    Hersey Lorenzo RDCS Referring Phys: [phone] PROSPER M AMPONSAH IMPRESSIONS  1. Left ventricular ejection fraction, by estimation, is 25 to 30%. Left  ventricular ejection fraction by 2D MOD biplane is 30.8 %. The left ventricle has severely decreased function. The left ventricle demonstrates global hypokinesis. There is moderate  concentric left ventricular hypertrophy. Left ventricular diastolic parameters are consistent with Grade I diastolic dysfunction (impaired relaxation).  2. Right ventricular systolic function is normal. The right ventricular size is normal. Tricuspid regurgitation signal is inadequate for assessing PA pressure.  3. Left atrial size was mildly dilated.  4. The mitral valve is normal in structure. Trivial mitral valve regurgitation. No evidence of mitral stenosis.  5. The aortic valve is tricuspid. There is mild calcification of the aortic valve. Aortic valve regurgitation is mild.  6. Aortic dilatation noted. There is mild dilatation of the aortic root, measuring 40 mm. There is mild dilatation of the ascending aorta, measuring 43 mm.  7. The inferior vena cava is normal in size with greater than 50% respiratory variability, suggesting right atrial pressure of 3 mmHg. FINDINGS  Left Ventricle: Left ventricular ejection fraction, by estimation, is 25 to 30%. Left ventricular ejection fraction by 2D MOD biplane is 30.8 %. The left ventricle has severely decreased function. The left ventricle demonstrates global hypokinesis. The left ventricular internal cavity size was normal in size. There is moderate concentric left ventricular hypertrophy. Left ventricular diastolic parameters are consistent with Grade I diastolic dysfunction (impaired relaxation). Right Ventricle: The right ventricular size is normal. No increase in right ventricular wall thickness. Right ventricular systolic function is normal. Tricuspid regurgitation signal is inadequate for assessing PA pressure. Left Atrium: Left atrial size was mildly dilated. Right Atrium: Right atrial size was normal in size. Pericardium: There is no evidence of pericardial effusion. Mitral  Valve: The mitral valve is normal in structure. Trivial mitral valve regurgitation. No evidence of mitral valve stenosis. Tricuspid Valve: The tricuspid valve is normal in structure. Tricuspid valve regurgitation is trivial. Aortic Valve: The aortic valve is tricuspid. There is mild calcification of the aortic valve. Aortic valve regurgitation is mild. Aortic regurgitation PHT measures 851 msec. Pulmonic Valve: The  pulmonic valve was normal in structure. Pulmonic valve regurgitation is not visualized. Aorta: Aortic dilatation noted. There is mild dilatation of the aortic root, measuring 40 mm. There is mild dilatation of the ascending aorta, measuring 43 mm. Venous: The inferior vena cava is normal in size with greater than 50% respiratory variability, suggesting right atrial pressure of 3 mmHg. IAS/Shunts: No atrial level shunt detected by color flow Doppler.  LEFT VENTRICLE PLAX 2D                        Biplane EF (MOD) LVIDd:         5.20 cm         LV Biplane EF:   Left LVIDs:         4.50 cm                          ventricular LV PW:         1.10 cm                          ejection LV IVS:        1.10 cm                          fraction by LVOT diam:     2.60 cm                          2D MOD LV SV:         66                               biplane is LV SV Index:   32                               30.8 %. LVOT Area:     5.31 cm                                Diastology                                LV e' lateral:   3.41 cm/s LV Volumes (MOD)               LV E/e' lateral: 14.1 LV vol d, MOD    157.0 ml A2C: LV vol d, MOD    199.0 ml A4C: LV vol s, MOD    110.0 ml A2C: LV vol s, MOD    134.0 ml A4C: LV SV MOD A2C:   47.0 ml LV SV MOD A4C:   199.0 ml LV SV MOD BP:    54.5 ml RIGHT VENTRICLE             IVC RV S prime:     11.10 cm/s  IVC diam: 1.20 cm TAPSE (M-mode): 2.2 cm LEFT ATRIUM           Index        RIGHT ATRIUM           Index LA diam:      2.30 cm 1.10 cm/m   RA Area:  15.80 cm LA Vol  (A4C): 40.3 ml 19.33 ml/m  RA Volume:   40.60 ml  19.47 ml/m  AORTIC VALVE LVOT Vmax:   75.90 cm/s LVOT Vmean:  56.200 cm/s LVOT VTI:    0.124 m AI PHT:      851 msec  AORTA Ao Root diam: 4.00 cm Ao Asc diam:  4.30 cm MITRAL VALVE MV Area (PHT): 4.08 cm    SHUNTS MV Decel Time: 186 msec    Systemic VTI:  0.12 m MV E velocity: 48.00 cm/s  Systemic Diam: 2.60 cm MV A velocity: 67.40 cm/s MV E/A ratio:  0.71 Dalton McleanMD Electronically signed by Archer Bear Signature Date/Time: 09/03/2023/7:34:48 PM    Final    CT ANGIO HEAD NECK W WO CM Result Date: 09/02/2023 CLINICAL DATA:  Neuro deficit, concern for stroke/TIA. Dizziness for 2 weeks. Recent fall and hit front of head. EXAM: CT ANGIOGRAPHY HEAD AND NECK WITH AND WITHOUT CONTRAST TECHNIQUE: Multidetector CT imaging of the head and neck was performed using the standard protocol during bolus administration of intravenous contrast. Multiplanar CT image reconstructions and MIPs were obtained to evaluate the vascular anatomy. Carotid stenosis measurements (when applicable) are obtained utilizing NASCET criteria, using the distal internal carotid diameter as the denominator. RADIATION DOSE REDUCTION: This exam was performed according to the departmental dose-optimization program which includes automated exposure control, adjustment of the mA and/or kV according to patient size and/or use of iterative reconstruction technique. CONTRAST:  60mL OMNIPAQUE  IOHEXOL  350 MG/ML SOLN COMPARISON:  Same day CT head and cervical spine. FINDINGS: CTA NECK FINDINGS Aortic arch: Standard configuration of the aortic arch. Imaged portion shows no evidence of aneurysm or dissection. No significant stenosis of the major arch vessel origins. Pulmonary arteries: As permitted by contrast timing, there are no filling defects in the visualized pulmonary arteries. Subclavian arteries: The subclavian arteries are patent bilaterally. Right carotid system: No evidence of dissection,  stenosis (50% or greater), or occlusion. Tortuosity of the proximal common carotid artery. There is additional tortuosity throughout the internal carotid artery. Left carotid system: No evidence of dissection, stenosis (50% or greater), or occlusion. Minimal calcified atherosclerosis at the carotid bifurcation without stenosis. Mild tortuosity of the proximal common carotid artery. Additional tortuosity of the cervical ICA most pronounced distally. Vertebral arteries: The right vertebral artery origin is patent with abrupt occlusion of the proximal right V1 segment. There is reconstitution of the distal V1 and proximal V2 segments which is severely diminished in caliber and appears irregular with additional occlusion of the right V2 segment near the level of C4. Irregular reconstitution of the distal V2 segment. Calcified atherosclerosis noted along the V3 and proximal V4 segments. Left vertebral artery origin is patent with diminished caliber and intraluminal contrast of the left V2 segment with occlusion at approximately the level of C4-5. The distal V2, V3, and V4 segments are occluded. Atherosclerosis along the proximal left V4 segment. Skeleton: No acute findings. Degenerative changes in the cervical spine. Disc space narrowing most pronounced at C6-7. Prominent anterior endplate osteophytes at multiple levels in the cervical spine. Edentulous maxilla. Other neck: The visualized airway is patent. No cervical lymphadenopathy. Upper chest: Visualized lung apices are clear. Review of the MIP images confirms the above findings CTA HEAD FINDINGS ANTERIOR CIRCULATION: The intracranial ICAs are patent bilaterally. Atherosclerosis of the bilateral carotid siphons resulting in moderate stenosis. There is additional severe stenosis of the left supraclinoid ICA. No proximal occlusion, aneurysm, or vascular malformation. MCAs: Patent bilaterally. Mild stenosis  of the proximal M1 segment right MCA. Additional mild narrowing  of a distal M2 segment of the right MCA. ACAs: The A1 segment of the left ACA is not visualized and may be stenotic secondary to atherosclerosis versus congenitally hypoplastic. Possible fenestration of the anterior communicating artery. The ACAs are patent distally. POSTERIOR CIRCULATION: Occlusion of the intracranial vertebral arteries as noted above. The basilar artery is patent but is diminished in caliber and irregular with moderate narrowing along the proximal aspect. The AICA is visualized on the left. The PICA are not well visualized. Bilateral superior cerebellar arteries noted. The PCAs are patent bilaterally but appear small in caliber. Moderate to severe narrowing of the P2 segment right PCA significantly diminished caliber of distal right PCA branches without evidence of focal occlusion. There is additional mild irregularity of the proximal P2 segment on the left. Severe stenosis of the posterior P2 segment of the left PCA. Diminished caliber of distal left PCA branches without focal occlusion. Venous sinuses: As permitted by contrast timing, patent. Anatomic variants: None Review of the MIP images confirms the above findings IMPRESSION: Occlusion of the bilateral vertebral arteries as above. Irregular reconstitution of the distal V2 segment of the right vertebral artery with occlusion of the right V3 and V4 segments. The left vertebral artery is occluded at the V2 segment from the level of C4-5. Additional irregularity and moderate narrowing of the proximal basilar artery. The basilar artery is irregular throughout its course without occlusion. Diminutive caliber of the bilateral posterior cerebral arteries. Moderate to severe stenosis of the P2 segment right PCA. Additional severe stenosis of the posterior P2 segment of the left PCA. Severely diminished caliber of distal PCA branches bilaterally without evidence of focal occlusion. Atherosclerosis of the carotid siphons. Focal severe stenosis of the  left supraclinoid ICA. Additional moderate stenosis of the bilateral cavernous ICAs. Tortuosity of the common carotid arteries and cervical internal carotid arteries which may be secondary to chronic hypertension. Electronically Signed   By: Denny Flack M.D.   On: 09/02/2023 20:00   DG Wrist Complete Right Result Date: 09/02/2023 CLINICAL DATA:  Pain after fall.  Swelling. EXAM: RIGHT WRIST - COMPLETE 3+ VIEW COMPARISON:  None Available. FINDINGS: There is no evidence of fracture or dislocation. Scattered osteoarthritis. Mild chondrocalcinosis. No erosive or bony destructive change. There is generalized subcutaneous edema. IMPRESSION: 1. Generalized subcutaneous edema. No fracture or subluxation of the right wrist. 2. Scattered osteoarthritis. Electronically Signed   By: Chadwick Colonel M.D.   On: 09/02/2023 18:25   MR BRAIN WO CONTRAST Result Date: 09/02/2023 CLINICAL DATA:  Initial evaluation for acute dizziness and vertigo. EXAM: MRI HEAD WITHOUT CONTRAST TECHNIQUE: Multiplanar, multiecho pulse sequences of the brain and surrounding structures were obtained without intravenous contrast. COMPARISON:  CT from 08/25/2023. FINDINGS: Brain: Diffuse prominence of the CSF containing spaces compatible with generalized cerebral atrophy, moderately advanced in nature. Patchy T2/FLAIR hyperintensity involving the supratentorial cerebral white matter, consistent with chronic small vessel ischemic disease, relatively mild for age. Patchy restricted diffusion seen involving the right greater than left cerebellar hemispheres (series 6, image 6). Associated T2/FLAIR signal abnormality with mild signal loss on ADC map, consistent with early subacute ischemic infarcts. Associated minimal petechial blood products at the right cerebellum without hemorrhagic transformation. No significant regional mass effect. These changes are superimposed on a few scattered underlying chronic cerebellar infarcts. No mass lesion, midline  shift or mass effect. No hydrocephalus or extra-axial fluid collection. Pituitary gland within normal limits. Vascular: Irregular flow  voids within the V4 segments bilaterally, which could reflect slow flow and/or occlusion. Anterior circulation flow voids maintained. Skull and upper cervical spine: Degenerative osteoarthritic changes about the C1-2 articulations without significant stenosis. Bone marrow signal intensity within normal limits. No scalp soft tissue abnormality. Sinuses/Orbits: Prior bilateral ocular lens replacement. Paranasal sinuses are clear. No mastoid effusion. Other: None. IMPRESSION: 1. Patchy early subacute ischemic infarcts involving the right greater than left cerebellar hemispheres. Associated minimal petechial blood products at the right cerebellum without hemorrhagic transformation or significant regional mass effect. 2. Irregular flow voids within the V4 segments bilaterally, which could reflect slow flow and/or occlusion. Correlation with dedicated vascular imaging recommended. 3. Underlying moderately advanced cerebral atrophy with mild chronic small vessel ischemic disease. Electronically Signed   By: Virgia Griffins M.D.   On: 09/02/2023 18:12   CT Head Wo Contrast Result Date: 09/02/2023 CLINICAL DATA:  Fall 2 days ago, dizziness EXAM: CT HEAD WITHOUT CONTRAST CT CERVICAL SPINE WITHOUT CONTRAST TECHNIQUE: Multidetector CT imaging of the head and cervical spine was performed following the standard protocol without intravenous contrast. Multiplanar CT image reconstructions of the cervical spine were also generated. RADIATION DOSE REDUCTION: This exam was performed according to the departmental dose-optimization program which includes automated exposure control, adjustment of the mA and/or kV according to patient size and/or use of iterative reconstruction technique. COMPARISON:  CT brain, 08/25/2023, same-day MR brain FINDINGS: CT HEAD FINDINGS Brain: Hypodense lesions in the  cerebellar hemispheres, measuring 3.4 x 3.2 cm on the right (series 3, image 7) and 0.8 cm and 1.8 x 1.2 cm on the left (series 3, image 8). Vascular: No hyperdense vessel or unexpected calcification. Skull: Normal. Negative for fracture or focal lesion. Sinuses/Orbits: No acute finding. Other: None. CT CERVICAL SPINE FINDINGS Alignment: Straightening of the normal cervical lordosis. Skull base and vertebrae: No acute fracture. No primary bone lesion or focal pathologic process. Soft tissues and spinal canal: No prevertebral fluid or swelling. No visible canal hematoma. Disc levels: Mild-to-moderate multilevel cervical disc degenerative disease throughout the cervical spine. Upper chest: Negative. Other: None. IMPRESSION: 1. Hypodense lesions in the cerebellar hemispheres, measuring 3.4 x 3.2 cm on the right and 0.8 cm and 1.8 x 1.2 cm on the left. Please see forthcoming MR for further characterization. 2. No acute fracture or static subluxation of the cervical spine. 3. Mild-to-moderate multilevel cervical disc degenerative disease throughout the cervical spine. Electronically Signed   By: Fredricka Jenny M.D.   On: 09/02/2023 17:16   CT Cervical Spine Wo Contrast Result Date: 09/02/2023 CLINICAL DATA:  Fall 2 days ago, dizziness EXAM: CT HEAD WITHOUT CONTRAST CT CERVICAL SPINE WITHOUT CONTRAST TECHNIQUE: Multidetector CT imaging of the head and cervical spine was performed following the standard protocol without intravenous contrast. Multiplanar CT image reconstructions of the cervical spine were also generated. RADIATION DOSE REDUCTION: This exam was performed according to the departmental dose-optimization program which includes automated exposure control, adjustment of the mA and/or kV according to patient size and/or use of iterative reconstruction technique. COMPARISON:  CT brain, 08/25/2023, same-day MR brain FINDINGS: CT HEAD FINDINGS Brain: Hypodense lesions in the cerebellar hemispheres, measuring 3.4 x  3.2 cm on the right (series 3, image 7) and 0.8 cm and 1.8 x 1.2 cm on the left (series 3, image 8). Vascular: No hyperdense vessel or unexpected calcification. Skull: Normal. Negative for fracture or focal lesion. Sinuses/Orbits: No acute finding. Other: None. CT CERVICAL SPINE FINDINGS Alignment: Straightening of the normal cervical lordosis. Skull base  and vertebrae: No acute fracture. No primary bone lesion or focal pathologic process. Soft tissues and spinal canal: No prevertebral fluid or swelling. No visible canal hematoma. Disc levels: Mild-to-moderate multilevel cervical disc degenerative disease throughout the cervical spine. Upper chest: Negative. Other: None. IMPRESSION: 1. Hypodense lesions in the cerebellar hemispheres, measuring 3.4 x 3.2 cm on the right and 0.8 cm and 1.8 x 1.2 cm on the left. Please see forthcoming MR for further characterization. 2. No acute fracture or static subluxation of the cervical spine. 3. Mild-to-moderate multilevel cervical disc degenerative disease throughout the cervical spine. Electronically Signed   By: Fredricka Jenny M.D.   On: 09/02/2023 17:16        Scheduled Meds:  aspirin  EC  81 mg Oral Daily   carvedilol   6.25 mg Oral BID WC   cefadroxil  500 mg Oral BID   cholecalciferol  1,000 Units Oral q AM   clopidogrel   75 mg Oral Daily   cyanocobalamin   1,000 mcg Oral q morning   enoxaparin  (LOVENOX ) injection  30 mg Subcutaneous Q24H   gabapentin   300 mg Oral BID   glipiZIDE   5 mg Oral Daily   insulin  aspart  0-15 Units Subcutaneous TID WC   insulin  aspart  0-5 Units Subcutaneous QHS   isosorbide  mononitrate  30 mg Oral q morning   potassium chloride  SA  40 mEq Oral Daily   tamsulosin   0.4 mg Oral BID   torsemide   60 mg Oral BID   Continuous Infusions:     LOS: 2 days     Vada Garibaldi, MD [facility]

## 2023-09-04 NOTE — PMR Pre-admission (Signed)
 PMR Admission Coordinator Pre-Admission Assessment  Patient: Allen Dyer is an 88 y.o., male MRN: [id number] DOB: 08/13/1935 Height: 5\' 7"  (170.2 cm) Weight: 97.5 kg  Insurance Information HMO:     PPO:      PCP:      IPA:      80/20:      OTHER:  PRIMARY: Medicare Part A and B      Policy#: [id number]       Subscriber: pt CM Name:       Phone#:      Fax#:  Pre-Cert#: verified Health and safety inspector:  Benefits:  Phone #:      Name:  Eff. Date: 05/25/02 A, 06/23/02 B     Deduct: $1676      Out of Pocket Max: n/a      Life Max: n/a CIR: 100%      SNF: 20 full days Outpatient: 80%     Co-Pay: 20% Home Health: 100%      Co-Pay:  DME: 80%     Co-Pay: 20% Providers:  SECONDARY: Tricare for Life      Policy#: [id number]      Phone#: [phone]  Financial Counselor:       Phone#:   The "Data Collection Information Summary" for patients in Inpatient Rehabilitation Facilities with attached "Privacy Act Statement-Health Care Records" was provided and verbally reviewed with: Patient  Emergency Contact Information Contact Information     Name Relation Home Work Mobile   campbell,veronica Daughter [phone]  [phone]      Other Contacts   None on File     Current Medical History  Patient Admitting Diagnosis: CVA   History of Present Illness: Pt is an 88 y/o male with PMH of HTN, HLD, NICM, mulitple myeloma, CAD, CKD stage IV, and DM who was admitted to [facility] on 09/02/23 with complaints of dizziness x2.5 weeks and a fall.  He initially presented to ED on 5/3 for the same and workup was unremarkable.  His symptoms did not improve on meclizine  so he returned.  In ED vitals normal, labs unremarkable, UA showed moderate hemoglobinuria, mild proteinuria, positive for nitrate and large leukocytes.  EKG with prolonged PR interval, LVH, RBBB, and LAFB.  Brain imaging revealed acute to early subacute ischemic infarcts in the R>L cerebellar hemispheres.  Neurology consulted.  CTA showed  occlusion of bilateral vertebral arteries, narrowing of the proximal basilar artery, and moderate to severe stenosis of the P2 segment of the right PCA and L PCA.  Echo with reduced EF 25-30%.  A1C 7.1.  Recommendations for aspirin  and plavix , and then plavix  alone.  Therapy ongoing and pt was recommended for CIR.   Complete NIHSS TOTAL: 0  Patient's medical record from [facility] has been reviewed by the rehabilitation admission coordinator and physician.  Past Medical History  Past Medical History:  Diagnosis Date   Chronic combined systolic and diastolic CHF (congestive heart failure) (HCC)    CKD (chronic kidney disease), stage III (HCC)    Hyperlipidemia    Hypertension    Lower extremity edema    Mild CAD    a. mild-mod by cath 05/2018.   Multiple myeloma (HCC) 11/15/2018   NICM (nonischemic cardiomyopathy) (HCC)    Peripheral neuropathy    Prostate cancer (HCC)    Status post XRT   Rheumatic fever    Stroke [facility])    Trifascicular block    Type 2 diabetes mellitus (HCC)  Vertigo     Has the patient had major surgery during 100 days prior to admission? No  Family History   family history includes Cancer in his mother and sister; Congestive Heart Failure in his brother and father.  Current Medications  Current Facility-Administered Medications:    acetaminophen  (TYLENOL ) tablet 650 mg, 650 mg, Oral, Q4H PRN, 650 mg at 09/02/23 2233 **OR** acetaminophen  (TYLENOL ) 160 MG/5ML solution 650 mg, 650 mg, Per Tube, Q4H PRN **OR** acetaminophen  (TYLENOL ) suppository 650 mg, 650 mg, Rectal, Q4H PRN, Yvonne Hering, Annette Killings, MD   aspirin  EC tablet 81 mg, 81 mg, Oral, Daily, Consuelo Denmark, MD, 81 mg at 09/04/23 1610   carvedilol  (COREG ) tablet 6.25 mg, 6.25 mg, Oral, BID WC, Ghimire, Kuber, MD, 6.25 mg at 09/04/23 0854   cefadroxil (DURICEF) capsule 500 mg, 500 mg, Oral, BID, Ghimire, Kuber, MD, 500 mg at 09/04/23 1243   cholecalciferol (VITAMIN D3) 25 MCG (1000 UNIT) tablet 1,000 Units,  1,000 Units, Oral, q AM, Vita Grip, MD, 1,000 Units at 09/04/23 9604   clopidogrel  (PLAVIX ) tablet 75 mg, 75 mg, Oral, Daily, Augustin Leber, MD, 75 mg at 09/04/23 5409   cyanocobalamin  (VITAMIN B12) tablet 1,000 mcg, 1,000 mcg, Oral, q morning, Amponsah, Prosper M, MD, 1,000 mcg at 09/04/23 8119   enoxaparin  (LOVENOX ) injection 30 mg, 30 mg, Subcutaneous, Q24H, Vita Grip, MD, 30 mg at 09/03/23 2202   gabapentin  (NEURONTIN ) capsule 300 mg, 300 mg, Oral, BID, Ghimire, Kuber, MD, 300 mg at 09/04/23 0854   glipiZIDE  (GLUCOTROL ) tablet 5 mg, 5 mg, Oral, Daily, Ghimire, Kuber, MD, 5 mg at 09/04/23 0855   insulin  aspart (novoLOG ) injection 0-15 Units, 0-15 Units, Subcutaneous, TID WC, Amponsah, Prosper M, MD, 5 Units at 09/04/23 1243   insulin  aspart (novoLOG ) injection 0-5 Units, 0-5 Units, Subcutaneous, QHS, Amponsah, Annette Killings, MD   isosorbide  mononitrate (IMDUR ) 24 hr tablet 30 mg, 30 mg, Oral, q morning, Ghimire, Kuber, MD, 30 mg at 09/04/23 1478   meclizine  (ANTIVERT ) tablet 25 mg, 25 mg, Oral, BID PRN, Ghimire, Kuber, MD   potassium chloride  SA (KLOR-CON  M) CR tablet 40 mEq, 40 mEq, Oral, Daily, Vita Grip, MD, 40 mEq at 09/04/23 0854   senna-docusate (Senokot-S) tablet 1 tablet, 1 tablet, Oral, QHS PRN, Vita Grip, MD   tamsulosin  (FLOMAX ) capsule 0.4 mg, 0.4 mg, Oral, BID, Vita Grip, MD, 0.4 mg at 09/04/23 2956   torsemide  (DEMADEX ) tablet 60 mg, 60 mg, Oral, BID, Vita Grip, MD, 60 mg at 09/04/23 0854  Patients Current Diet:  Diet Order             Diet Carb Modified Fluid consistency: Thin; Room service appropriate? Yes with Assist  Diet effective now                   Precautions / Restrictions Precautions Precautions: Fall Precaution/Restrictions Comments: Urinary incontinence Restrictions Weight Bearing Restrictions Per Provider Order: No Other Position/Activity Restrictions: as tolerates   Has the patient  had 2 or more falls or a fall with injury in the past year? Yes  Prior Activity Level Community (5-7x/wk): independent prior to admit, no DME used, driving  Prior Functional Level Self Care: Did the patient need help bathing, dressing, using the toilet or eating? Independent  Indoor Mobility: Did the patient need assistance with walking from room to room (with or without device)? Independent  Stairs: Did the patient need assistance with internal or external stairs (with or without device)?  Independent  Functional Cognition: Did the patient need help planning regular tasks such as shopping or remembering to take medications? Independent  Patient Information Are you of Hispanic, Latino/a,or Spanish origin?: A. No, not of Hispanic, Latino/a, or Spanish origin What is your race?: B. Black or African American Do you need or want an interpreter to communicate with a doctor or health care staff?: 0. No  Patient's Response To:  Health Literacy and Transportation Is the patient able to respond to health literacy and transportation needs?: Yes Health Literacy - How often do you need to have someone help you when you read instructions, pamphlets, or other written material from your doctor or pharmacy?: Never In the past 12 months, has lack of transportation kept you from medical appointments or from getting medications?: No In the past 12 months, has lack of transportation kept you from meetings, work, or from getting things needed for daily living?: No  Home Assistive Devices / Equipment Home Equipment: Jeananne Mighty - single point  Prior Device Use: Indicate devices/aids used by the patient prior to current illness, exacerbation or injury? None of the above  Current Functional Level Cognition  Orientation Level: Oriented X4    Extremity Assessment (includes Sensation/Coordination)  Upper Extremity Assessment: Right hand dominant, RUE deficits/detail RUE Deficits / Details: R wrist pain from fall;  difficulty assessing movement due to wrist pain which is affecting his ability to move his elbow and hand; unable to use R hand functionally; gross grasp lacking @ 1 in from palmer crease; unable to tolerate any wrist movement; daughter reports uncoordinated movement prior to fall RUE: Unable to fully assess due to pain RUE Coordination: decreased fine motor  Lower Extremity Assessment: Defer to PT evaluation    ADLs  Overall ADL's : Needs assistance/impaired Eating/Feeding: Set up Grooming: Set up, Sitting Upper Body Bathing: Minimal assistance, Sitting Lower Body Bathing: Moderate assistance, Sit to/from stand Upper Body Dressing : Minimal assistance, Sitting Lower Body Dressing: Moderate assistance, Sit to/from stand Toilet Transfer: Minimal assistance, Stand-pivot Toileting- Clothing Manipulation and Hygiene: Maximal assistance Functional mobility during ADLs: Minimal assistance General ADL Comments: very unsteady; requires external support    Mobility  Overal bed mobility: Needs Assistance Bed Mobility: Supine to Sit Supine to sit: Min assist Sit to supine: Contact guard assist General bed mobility comments: Pt able to execute with increased time, assist with bed pad to scoot hips forward to edge    Transfers  Overall transfer level: Needs assistance Equipment used: Quad cane Transfers: Sit to/from Stand Sit to Stand: Min assist General transfer comment: Assist to power up and steady    Ambulation / Gait / Stairs / Psychologist, prison and probation services  Ambulation/Gait Ambulation/Gait assistance: Editor, commissioning (Feet): 75 Feet Assistive device: Quad cane Gait Pattern/deviations: Trunk flexed, Decreased stride length, Wide base of support, Step-to pattern, Shuffle General Gait Details: Verbal instruction for quad cane use, larger step lengths, upright posture. Pt with shuffling gait pattern with decreased bilateral foot clearance, tendency for downward gaze. Gait velocity:  decreased    Posture / Balance Dynamic Sitting Balance Sitting balance - Comments: EOB Balance Overall balance assessment: Needs assistance, History of Falls Sitting-balance support: Feet supported, Single extremity supported Sitting balance-Leahy Scale: Good Sitting balance - Comments: EOB Standing balance support: Single extremity supported, During functional activity Standing balance-Leahy Scale: Poor Standing balance comment: Reliant on external support.    Special needs/care consideration Diabetic management A1C 7.1   Previous Home Environment (from acute therapy documentation) Living Arrangements: Children Available  Help at Discharge: Family, Available 24 hours/day Type of Home: House Home Layout: Two level, Able to live on main level with bedroom/bathroom, Bed/bath upstairs Alternate Level Stairs-Rails: Left Alternate Level Stairs-Number of Steps: 10 (very steep per daughter) Home Access: Stairs to enter Entrance Stairs-Rails: Can reach both Entrance Stairs-Number of Steps: 5 Bathroom Shower/Tub: Tub/shower unit (Only shower is upstairs) Firefighter: Handicapped height Bathroom Accessibility: Yes How Accessible: Accessible via walker Home Care Services: No  Discharge Living Setting Plans for Discharge Living Setting: Patient's home, Lives with (comment) (daughter, [name]) Type of Home at Discharge: House Discharge Home Layout: 1/2 bath on main level, Other (Comment) (shower upstairs) Discharge Home Access: Stairs to enter Entrance Stairs-Rails: Can reach both Entrance Stairs-Number of Steps: 5 Discharge Bathroom Shower/Tub: Tub/shower unit (upstairs) Discharge Bathroom Toilet: Handicapped height Discharge Bathroom Accessibility: Yes How Accessible: Accessible via walker Does the patient have any problems obtaining your medications?: No  Social/Family/Support Systems Anticipated Caregiver: daughter, [name] Anticipated Caregiver's Contact Information:  [phone] Ability/Limitations of Caregiver: none stated Caregiver Availability: 24/7 Discharge Plan Discussed with Primary Caregiver: Yes Is Caregiver In Agreement with Plan?: Yes Does Caregiver/Family have Issues with Lodging/Transportation while Pt is in Rehab?: No  Goals Patient/Family Goal for Rehab: PT/OT supervision to mod I, SLP n/a Expected length of stay: 10-12 days Additional Information: Discharge plan: return to pt's home with daughter who can provide 24/7 supervision Pt/Family Agrees to Admission and willing to participate: Yes Program Orientation Provided & Reviewed with Pt/Caregiver Including Roles  & Responsibilities: Yes  Decrease burden of Care through IP rehab admission: n/a  Possible need for SNF placement upon discharge:  Not anticipated.  Plan for d/c home with pt's daughter able to provide 24/7 supervision if needed.   Patient Condition: I have reviewed medical records from [facility], spoken with [facility] team, and patient. I met with patient at the bedside for inpatient rehabilitation assessment.  Patient will benefit from ongoing PT and OT, can actively participate in 3 hours of therapy a day 5 days of the week, and can make measurable gains during the admission.  Patient will also benefit from the coordinated team approach during an Inpatient Acute Rehabilitation admission.  The patient will receive intensive therapy as well as Rehabilitation physician, nursing, social worker, and care management interventions.  Due to safety, disease management, medication administration, and patient education the patient requires 24 hour a day rehabilitation nursing.  The patient is currently min to mod assist with mobility and ADLs with mobility and basic ADLs.  Discharge setting and therapy post discharge at home with home health is anticipated.  Patient has agreed to participate in the Acute Inpatient Rehabilitation Program and will admit today.  Preadmission Screen Completed By:   Mickey Alar, PT, DPT 09/04/2023 4:59 PM ______________________________________________________________________   Discussed status with Dr. Aaron Aas on 09/04/23  at 5:05 PM  and received approval for admission today.  Admission Coordinator:  Caitlin E Warren, PT, DPT time 5:05 PM Alanna Hu 09/04/23    Assessment/Plan: Diagnosis: *** Does the need for close, 24 hr/day Medical supervision in concert with the patient's rehab needs make it unreasonable for this patient to be served in a less intensive setting? {yes_no_potentially:3041433} Co-Morbidities requiring supervision/potential complications: *** Due to {due JY:7829562}, does the patient require 24 hr/day rehab nursing? {yes_no_potentially:3041433} Does the patient require coordinated care of a physician, rehab nurse, PT, OT, and SLP to address physical and functional deficits in the context of the above medical diagnosis(es)? {yes_no_potentially:3041433} Addressing deficits in the  following areas: {deficits:3041436} Can the patient actively participate in an intensive therapy program of at least 3 hrs of therapy 5 days a week? {yes_no_potentially:3041433} The potential for patient to make measurable gains while on inpatient rehab is {potential:3041437} Anticipated functional outcomes upon discharge from inpatient rehab: {functional outcomes:304600100} PT, {functional outcomes:304600100} OT, {functional outcomes:304600100} SLP Estimated rehab length of stay to reach the above functional goals is: *** Anticipated discharge destination: {anticipated dc setting:21604} 10. Overall Rehab/Functional Prognosis: {potential:3041437}   MD Signature: ***

## 2023-09-04 NOTE — Progress Notes (Signed)
 Physical Therapy Treatment Patient Details Name: Allen Dyer MRN: [id number] DOB: 01-27-1936 Today's Date: 09/04/2023   History of Present Illness Allen Dyer is a 88 y.o. male who presents with unsteady gait and fall, sustaining a R wrist injury - xray demonstrates Generalized subcutaneous edema; No fracture or subluxation of the  right wrist.  MRI (+) Patchy early subacute ischemic infarcts involving the right  greater than left cerebellar hemispheres. Past medical hx of hypertension, yperlipidemia, nonischemic cardiomyopathy, diabetes.    PT Comments  Pt progressing steadily towards his physical therapy goals. Pt reporting dizziness when turning head to right and "fullness," in R ear which has been present for 3 weeks. Encouraged continued smooth pursuits exercises given by OT yesterday. Session focused on gait training with quad cane, which provided pt increased unilateral support and stability. Pt ambulating 75 ft with min assist for balance. Patient will benefit from intensive inpatient follow-up therapy, >3 hours/day in order to address deficits and maximize functional independence.     If plan is discharge home, recommend the following: A little help with walking and/or transfers;A little help with bathing/dressing/bathroom;Help with stairs or ramp for entrance;Supervision due to cognitive status;Assist for transportation;Assistance with cooking/housework   Can travel by private vehicle        Equipment Recommendations  Other (comment) (quad cane)    Recommendations for Other Services       Precautions / Restrictions Precautions Precautions: Fall Recall of Precautions/Restrictions: Intact Precaution/Restrictions Comments: Urinary incontinence Required Braces or Orthoses: Splint/Cast Splint/Cast: R wrist cock up splint Restrictions Weight Bearing Restrictions Per Provider Order: No Other Position/Activity Restrictions: as tolerates     Mobility  Bed Mobility Overal bed  mobility: Needs Assistance Bed Mobility: Supine to Sit     Supine to sit: Min assist     General bed mobility comments: Pt able to execute with increased time, assist with bed pad to scoot hips forward to edge    Transfers Overall transfer level: Needs assistance Equipment used: Quad cane Transfers: Sit to/from Stand Sit to Stand: Min assist           General transfer comment: Assist to power up and steady    Ambulation/Gait Ambulation/Gait assistance: Min Chemical engineer (Feet): 75 Feet Assistive device: Quad cane Gait Pattern/deviations: Trunk flexed, Decreased stride length, Wide base of support, Step-to pattern, Shuffle Gait velocity: decreased     General Gait Details: Verbal instruction for quad cane use, larger step lengths, upright posture. Pt with shuffling gait pattern with decreased bilateral foot clearance, tendency for downward gaze.   Stairs             Wheelchair Mobility     Tilt Bed    Modified Rankin (Stroke Patients Only) Modified Rankin (Stroke Patients Only) Pre-Morbid Rankin Score: No symptoms Modified Rankin: Moderately severe disability     Balance Overall balance assessment: Needs assistance, History of Falls Sitting-balance support: Feet supported, Single extremity supported Sitting balance-Leahy Scale: Good Sitting balance - Comments: EOB   Standing balance support: Single extremity supported, During functional activity Standing balance-Leahy Scale: Poor Standing balance comment: Reliant on external support.                            Communication Communication Communication: No apparent difficulties  Cognition Arousal: Alert Behavior During Therapy: WFL for tasks assessed/performed   PT - Cognitive impairments: Attention, Problem solving  Following commands: Impaired Following commands impaired: Follows one step commands with increased time    Cueing Cueing  Techniques: Verbal cues, Tactile cues, Visual cues  Exercises Other Exercises Other Exercises: smooth pursuits    General Comments        Pertinent Vitals/Pain Pain Assessment Pain Assessment: Faces Faces Pain Scale: Hurts even more Pain Location: R elbow; tenderness to palpation and pain with AROM/PROM Pain Descriptors / Indicators: Sharp Pain Intervention(s): Monitored during session, Repositioned    Home Living                          Prior Function            PT Goals (current goals can now be found in the care plan section) Acute Rehab PT Goals Patient Stated Goal: to get better Potential to Achieve Goals: Good Progress towards PT goals: Progressing toward goals    Frequency    Min 3X/week      PT Plan      Co-evaluation              AM-PAC PT "6 Clicks" Mobility   Outcome Measure  Help needed turning from your back to your side while in a flat bed without using bedrails?: A Little Help needed moving from lying on your back to sitting on the side of a flat bed without using bedrails?: A Little Help needed moving to and from a bed to a chair (including a wheelchair)?: A Little Help needed standing up from a chair using your arms (e.g., wheelchair or bedside chair)?: A Little Help needed to walk in hospital room?: A Little Help needed climbing 3-5 steps with a railing? : A Lot 6 Click Score: 17    End of Session Equipment Utilized During Treatment: Gait belt Activity Tolerance: Patient tolerated treatment well Patient left: in chair;with call bell/phone within reach;with chair alarm set   PT Visit Diagnosis: Other abnormalities of gait and mobility (R26.89);Repeated falls (R29.6)     Time: 0811-0850 PT Time Calculation (min) (ACUTE ONLY): 39 min  Charges:    $Gait Training: 8-22 mins $Therapeutic Activity: 23-37 mins PT General Charges $$ ACUTE PT VISIT: 1 Visit                     Verdia Glad, PT, DPT Acute  Rehabilitation Services Office [phone]    Claria Crofts 09/04/2023, 10:10 AM

## 2023-09-04 NOTE — Plan of Care (Signed)
   Problem: Education: Goal: Knowledge of disease or condition will improve Outcome: Progressing

## 2023-09-05 ENCOUNTER — Ambulatory Visit: Admitting: Podiatry

## 2023-09-05 ENCOUNTER — Other Ambulatory Visit: Payer: Self-pay

## 2023-09-05 ENCOUNTER — Encounter (HOSPITAL_COMMUNITY): Payer: Self-pay | Admitting: Physical Medicine and Rehabilitation

## 2023-09-05 ENCOUNTER — Inpatient Hospital Stay (HOSPITAL_COMMUNITY)
Admission: AD | Admit: 2023-09-05 | Discharge: 2023-09-27 | DRG: 057 | Disposition: A | Discharge status: Home Health | Source: Intra-hospital | Attending: Physical Medicine and Rehabilitation | Admitting: Physical Medicine and Rehabilitation

## 2023-09-05 DIAGNOSIS — I6523 Occlusion and stenosis of bilateral carotid arteries: Secondary | ICD-10-CM | POA: Diagnosis not present

## 2023-09-05 DIAGNOSIS — I639 Cerebral infarction, unspecified: Secondary | ICD-10-CM | POA: Diagnosis not present

## 2023-09-05 DIAGNOSIS — I69319 Unspecified symptoms and signs involving cognitive functions following cerebral infarction: Secondary | ICD-10-CM | POA: Diagnosis not present

## 2023-09-05 DIAGNOSIS — Z6834 Body mass index (BMI) 34.0-34.9, adult: Secondary | ICD-10-CM

## 2023-09-05 DIAGNOSIS — N184 Chronic kidney disease, stage 4 (severe): Secondary | ICD-10-CM | POA: Diagnosis present

## 2023-09-05 DIAGNOSIS — K219 Gastro-esophageal reflux disease without esophagitis: Secondary | ICD-10-CM | POA: Diagnosis present

## 2023-09-05 DIAGNOSIS — Z7982 Long term (current) use of aspirin: Secondary | ICD-10-CM

## 2023-09-05 DIAGNOSIS — D539 Nutritional anemia, unspecified: Secondary | ICD-10-CM | POA: Diagnosis present

## 2023-09-05 DIAGNOSIS — E785 Hyperlipidemia, unspecified: Secondary | ICD-10-CM | POA: Diagnosis present

## 2023-09-05 DIAGNOSIS — I2510 Atherosclerotic heart disease of native coronary artery without angina pectoris: Secondary | ICD-10-CM | POA: Diagnosis present

## 2023-09-05 DIAGNOSIS — N401 Enlarged prostate with lower urinary tract symptoms: Secondary | ICD-10-CM | POA: Diagnosis not present

## 2023-09-05 DIAGNOSIS — B9689 Other specified bacterial agents as the cause of diseases classified elsewhere: Secondary | ICD-10-CM | POA: Diagnosis present

## 2023-09-05 DIAGNOSIS — R238 Other skin changes: Secondary | ICD-10-CM | POA: Diagnosis not present

## 2023-09-05 DIAGNOSIS — N400 Enlarged prostate without lower urinary tract symptoms: Secondary | ICD-10-CM | POA: Diagnosis present

## 2023-09-05 DIAGNOSIS — C9000 Multiple myeloma not having achieved remission: Secondary | ICD-10-CM | POA: Diagnosis present

## 2023-09-05 DIAGNOSIS — G3189 Other specified degenerative diseases of nervous system: Secondary | ICD-10-CM | POA: Diagnosis not present

## 2023-09-05 DIAGNOSIS — R296 Repeated falls: Secondary | ICD-10-CM | POA: Diagnosis present

## 2023-09-05 DIAGNOSIS — K5900 Constipation, unspecified: Secondary | ICD-10-CM | POA: Diagnosis present

## 2023-09-05 DIAGNOSIS — F32A Depression, unspecified: Secondary | ICD-10-CM | POA: Diagnosis present

## 2023-09-05 DIAGNOSIS — G936 Cerebral edema: Secondary | ICD-10-CM | POA: Diagnosis not present

## 2023-09-05 DIAGNOSIS — I63543 Cerebral infarction due to unspecified occlusion or stenosis of bilateral cerebellar arteries: Principal | ICD-10-CM

## 2023-09-05 DIAGNOSIS — R29818 Other symptoms and signs involving the nervous system: Secondary | ICD-10-CM | POA: Diagnosis not present

## 2023-09-05 DIAGNOSIS — M7989 Other specified soft tissue disorders: Secondary | ICD-10-CM | POA: Diagnosis not present

## 2023-09-05 DIAGNOSIS — M25531 Pain in right wrist: Secondary | ICD-10-CM | POA: Diagnosis not present

## 2023-09-05 DIAGNOSIS — Z7409 Other reduced mobility: Secondary | ICD-10-CM | POA: Diagnosis present

## 2023-09-05 DIAGNOSIS — E1122 Type 2 diabetes mellitus with diabetic chronic kidney disease: Secondary | ICD-10-CM | POA: Diagnosis present

## 2023-09-05 DIAGNOSIS — R159 Full incontinence of feces: Secondary | ICD-10-CM | POA: Diagnosis not present

## 2023-09-05 DIAGNOSIS — R001 Bradycardia, unspecified: Secondary | ICD-10-CM | POA: Diagnosis not present

## 2023-09-05 DIAGNOSIS — G9389 Other specified disorders of brain: Secondary | ICD-10-CM | POA: Diagnosis not present

## 2023-09-05 DIAGNOSIS — I453 Trifascicular block: Secondary | ICD-10-CM | POA: Diagnosis present

## 2023-09-05 DIAGNOSIS — R52 Pain, unspecified: Secondary | ICD-10-CM

## 2023-09-05 DIAGNOSIS — R270 Ataxia, unspecified: Secondary | ICD-10-CM | POA: Diagnosis not present

## 2023-09-05 DIAGNOSIS — Y92099 Unspecified place in other non-institutional residence as the place of occurrence of the external cause: Secondary | ICD-10-CM | POA: Diagnosis not present

## 2023-09-05 DIAGNOSIS — Z888 Allergy status to other drugs, medicaments and biological substances status: Secondary | ICD-10-CM

## 2023-09-05 DIAGNOSIS — Y738 Miscellaneous gastroenterology and urology devices associated with adverse incidents, not elsewhere classified: Secondary | ICD-10-CM | POA: Diagnosis not present

## 2023-09-05 DIAGNOSIS — Z885 Allergy status to narcotic agent status: Secondary | ICD-10-CM

## 2023-09-05 DIAGNOSIS — Z923 Personal history of irradiation: Secondary | ICD-10-CM

## 2023-09-05 DIAGNOSIS — E669 Obesity, unspecified: Secondary | ICD-10-CM | POA: Diagnosis present

## 2023-09-05 DIAGNOSIS — Z66 Do not resuscitate: Secondary | ICD-10-CM | POA: Diagnosis present

## 2023-09-05 DIAGNOSIS — R32 Unspecified urinary incontinence: Secondary | ICD-10-CM | POA: Diagnosis present

## 2023-09-05 DIAGNOSIS — I428 Other cardiomyopathies: Secondary | ICD-10-CM | POA: Diagnosis present

## 2023-09-05 DIAGNOSIS — W19XXXD Unspecified fall, subsequent encounter: Secondary | ICD-10-CM | POA: Diagnosis present

## 2023-09-05 DIAGNOSIS — N4889 Other specified disorders of penis: Secondary | ICD-10-CM | POA: Diagnosis not present

## 2023-09-05 DIAGNOSIS — Z7984 Long term (current) use of oral hypoglycemic drugs: Secondary | ICD-10-CM

## 2023-09-05 DIAGNOSIS — N390 Urinary tract infection, site not specified: Secondary | ICD-10-CM | POA: Diagnosis present

## 2023-09-05 DIAGNOSIS — I69312 Visuospatial deficit and spatial neglect following cerebral infarction: Secondary | ICD-10-CM

## 2023-09-05 DIAGNOSIS — R338 Other retention of urine: Secondary | ICD-10-CM | POA: Diagnosis not present

## 2023-09-05 DIAGNOSIS — Z882 Allergy status to sulfonamides status: Secondary | ICD-10-CM

## 2023-09-05 DIAGNOSIS — E1142 Type 2 diabetes mellitus with diabetic polyneuropathy: Secondary | ICD-10-CM | POA: Diagnosis present

## 2023-09-05 DIAGNOSIS — I5042 Chronic combined systolic (congestive) and diastolic (congestive) heart failure: Secondary | ICD-10-CM | POA: Diagnosis present

## 2023-09-05 DIAGNOSIS — I69322 Dysarthria following cerebral infarction: Secondary | ICD-10-CM | POA: Diagnosis not present

## 2023-09-05 DIAGNOSIS — N189 Chronic kidney disease, unspecified: Secondary | ICD-10-CM | POA: Diagnosis not present

## 2023-09-05 DIAGNOSIS — Z7401 Bed confinement status: Secondary | ICD-10-CM | POA: Diagnosis not present

## 2023-09-05 DIAGNOSIS — I130 Hypertensive heart and chronic kidney disease with heart failure and stage 1 through stage 4 chronic kidney disease, or unspecified chronic kidney disease: Secondary | ICD-10-CM | POA: Diagnosis present

## 2023-09-05 DIAGNOSIS — R5383 Other fatigue: Secondary | ICD-10-CM | POA: Diagnosis not present

## 2023-09-05 DIAGNOSIS — I959 Hypotension, unspecified: Secondary | ICD-10-CM | POA: Diagnosis not present

## 2023-09-05 DIAGNOSIS — I6381 Other cerebral infarction due to occlusion or stenosis of small artery: Secondary | ICD-10-CM | POA: Diagnosis not present

## 2023-09-05 DIAGNOSIS — Z8673 Personal history of transient ischemic attack (TIA), and cerebral infarction without residual deficits: Secondary | ICD-10-CM | POA: Diagnosis not present

## 2023-09-05 DIAGNOSIS — T8384XA Pain from genitourinary prosthetic devices, implants and grafts, initial encounter: Secondary | ICD-10-CM | POA: Diagnosis not present

## 2023-09-05 DIAGNOSIS — Z515 Encounter for palliative care: Secondary | ICD-10-CM

## 2023-09-05 DIAGNOSIS — E1169 Type 2 diabetes mellitus with other specified complication: Secondary | ICD-10-CM | POA: Diagnosis not present

## 2023-09-05 DIAGNOSIS — Z713 Dietary counseling and surveillance: Secondary | ICD-10-CM

## 2023-09-05 DIAGNOSIS — R251 Tremor, unspecified: Secondary | ICD-10-CM | POA: Diagnosis not present

## 2023-09-05 DIAGNOSIS — Y92238 Other place in hospital as the place of occurrence of the external cause: Secondary | ICD-10-CM | POA: Diagnosis not present

## 2023-09-05 DIAGNOSIS — Z8546 Personal history of malignant neoplasm of prostate: Secondary | ICD-10-CM

## 2023-09-05 DIAGNOSIS — Z809 Family history of malignant neoplasm, unspecified: Secondary | ICD-10-CM

## 2023-09-05 DIAGNOSIS — Z8249 Family history of ischemic heart disease and other diseases of the circulatory system: Secondary | ICD-10-CM

## 2023-09-05 DIAGNOSIS — N35912 Unspecified bulbous urethral stricture, male: Secondary | ICD-10-CM | POA: Diagnosis present

## 2023-09-05 DIAGNOSIS — I5043 Acute on chronic combined systolic (congestive) and diastolic (congestive) heart failure: Secondary | ICD-10-CM

## 2023-09-05 DIAGNOSIS — Z79899 Other long term (current) drug therapy: Secondary | ICD-10-CM

## 2023-09-05 DIAGNOSIS — I69398 Other sequelae of cerebral infarction: Principal | ICD-10-CM

## 2023-09-05 DIAGNOSIS — M109 Gout, unspecified: Secondary | ICD-10-CM | POA: Diagnosis present

## 2023-09-05 DIAGNOSIS — M79641 Pain in right hand: Secondary | ICD-10-CM | POA: Diagnosis not present

## 2023-09-05 DIAGNOSIS — R42 Dizziness and giddiness: Secondary | ICD-10-CM | POA: Diagnosis not present

## 2023-09-05 DIAGNOSIS — Y846 Urinary catheterization as the cause of abnormal reaction of the patient, or of later complication, without mention of misadventure at the time of the procedure: Secondary | ICD-10-CM | POA: Diagnosis not present

## 2023-09-05 DIAGNOSIS — I6782 Cerebral ischemia: Secondary | ICD-10-CM | POA: Diagnosis not present

## 2023-09-05 DIAGNOSIS — Z794 Long term (current) use of insulin: Secondary | ICD-10-CM

## 2023-09-05 DIAGNOSIS — I10 Essential (primary) hypertension: Secondary | ICD-10-CM

## 2023-09-05 DIAGNOSIS — H547 Unspecified visual loss: Secondary | ICD-10-CM | POA: Diagnosis present

## 2023-09-05 DIAGNOSIS — Z9181 History of falling: Secondary | ICD-10-CM

## 2023-09-05 DIAGNOSIS — I69393 Ataxia following cerebral infarction: Secondary | ICD-10-CM

## 2023-09-05 DIAGNOSIS — Z881 Allergy status to other antibiotic agents status: Secondary | ICD-10-CM

## 2023-09-05 DIAGNOSIS — Z87891 Personal history of nicotine dependence: Secondary | ICD-10-CM

## 2023-09-05 DIAGNOSIS — S62113D Displaced fracture of triquetrum [cuneiform] bone, unspecified wrist, subsequent encounter for fracture with routine healing: Secondary | ICD-10-CM

## 2023-09-05 DIAGNOSIS — N35919 Unspecified urethral stricture, male, unspecified site: Secondary | ICD-10-CM | POA: Diagnosis not present

## 2023-09-05 DIAGNOSIS — H5500 Unspecified nystagmus: Secondary | ICD-10-CM | POA: Diagnosis present

## 2023-09-05 DIAGNOSIS — G319 Degenerative disease of nervous system, unspecified: Secondary | ICD-10-CM | POA: Diagnosis not present

## 2023-09-05 DIAGNOSIS — G459 Transient cerebral ischemic attack, unspecified: Secondary | ICD-10-CM | POA: Diagnosis not present

## 2023-09-05 LAB — CBC
HCT: 33.9 % — ABNORMAL LOW (ref 39.0–52.0)
Hemoglobin: 11.1 g/dL — ABNORMAL LOW (ref 13.0–17.0)
MCH: 32.5 pg (ref 26.0–34.0)
MCHC: 32.7 g/dL (ref 30.0–36.0)
MCV: 99.1 fL (ref 80.0–100.0)
Platelets: 173 10*3/uL (ref 150–400)
RBC: 3.42 MIL/uL — ABNORMAL LOW (ref 4.22–5.81)
RDW: 13.2 % (ref 11.5–15.5)
WBC: 7.6 10*3/uL (ref 4.0–10.5)
nRBC: 0 % (ref 0.0–0.2)

## 2023-09-05 LAB — GLUCOSE, CAPILLARY
Glucose-Capillary: 170 mg/dL — ABNORMAL HIGH (ref 70–99)
Glucose-Capillary: 153 mg/dL — ABNORMAL HIGH (ref 70–99)
Glucose-Capillary: 131 mg/dL — ABNORMAL HIGH (ref 70–99)
Glucose-Capillary: 232 mg/dL — ABNORMAL HIGH (ref 70–99)

## 2023-09-05 LAB — CREATININE, SERUM
Creatinine, Ser: 2.79 mg/dL — ABNORMAL HIGH (ref 0.61–1.24)
GFR, Estimated: 21 mL/min — ABNORMAL LOW (ref >60)

## 2023-09-05 MED ORDER — TORSEMIDE 20 MG PO TABS
60.0000 mg | ORAL_TABLET | Freq: Two times a day (BID) | ORAL | Status: DC
  Administered 2023-09-06 – 2023-09-08 (×6): 60 mg via ORAL
  Filled 2023-09-05 (×6): qty 3

## 2023-09-05 MED ORDER — CEFADROXIL 500 MG PO CAPS
500.0000 mg | ORAL_CAPSULE | Freq: Two times a day (BID) | ORAL | Status: DC
Start: 2023-09-05 — End: 2023-09-09
  Administered 2023-09-05 – 2023-09-06 (×3): 500 mg via ORAL
  Filled 2023-09-05 (×4): qty 1

## 2023-09-05 MED ORDER — TAMSULOSIN HCL 0.4 MG PO CAPS
0.4000 mg | ORAL_CAPSULE | Freq: Two times a day (BID) | ORAL | Status: DC
Start: 2023-09-05 — End: 2023-09-27
  Administered 2023-09-05 – 2023-09-27 (×44): 0.4 mg via ORAL
  Filled 2023-09-05 (×45): qty 1

## 2023-09-05 MED ORDER — ISOSORBIDE MONONITRATE ER 60 MG PO TB24
30.0000 mg | ORAL_TABLET | Freq: Every morning | ORAL | Status: DC
  Administered 2023-09-06 – 2023-09-09 (×4): 30 mg via ORAL
  Filled 2023-09-05 (×4): qty 1

## 2023-09-05 MED ORDER — INSULIN ASPART 100 UNIT/ML IJ SOLN
0.0000 [IU] | Freq: Three times a day (TID) | INTRAMUSCULAR | Status: DC
Start: 2023-09-06 — End: 2023-09-21
  Administered 2023-09-06 (×2): 5 [IU] via SUBCUTANEOUS
  Administered 2023-09-06 – 2023-09-07 (×3): 3 [IU] via SUBCUTANEOUS
  Administered 2023-09-08: 11 [IU] via SUBCUTANEOUS
  Administered 2023-09-08 – 2023-09-09 (×3): 2 [IU] via SUBCUTANEOUS
  Administered 2023-09-09: 3 [IU] via SUBCUTANEOUS
  Administered 2023-09-10: 2 [IU] via SUBCUTANEOUS
  Administered 2023-09-10: 3 [IU] via SUBCUTANEOUS
  Administered 2023-09-10: 8 [IU] via SUBCUTANEOUS
  Administered 2023-09-11 (×2): 5 [IU] via SUBCUTANEOUS
  Administered 2023-09-12 (×2): 3 [IU] via SUBCUTANEOUS
  Administered 2023-09-13: 2 [IU] via SUBCUTANEOUS
  Administered 2023-09-13 – 2023-09-15 (×4): 3 [IU] via SUBCUTANEOUS
  Administered 2023-09-15: 2 [IU] via SUBCUTANEOUS
  Administered 2023-09-16 – 2023-09-19 (×7): 3 [IU] via SUBCUTANEOUS
  Administered 2023-09-19 – 2023-09-20 (×3): 2 [IU] via SUBCUTANEOUS

## 2023-09-05 MED ORDER — ACETAMINOPHEN 325 MG PO TABS
650.0000 mg | ORAL_TABLET | ORAL | Status: DC | PRN
  Administered 2023-09-06 – 2023-09-13 (×6): 650 mg via ORAL
  Filled 2023-09-05 (×6): qty 2

## 2023-09-05 MED ORDER — MECLIZINE HCL 25 MG PO TABS
25.0000 mg | ORAL_TABLET | Freq: Two times a day (BID) | ORAL | Status: DC | PRN
  Administered 2023-09-07: 25 mg via ORAL
  Filled 2023-09-05: qty 1

## 2023-09-05 MED ORDER — POLYETHYLENE GLYCOL 3350 17 G PO PACK
17.0000 g | PACK | Freq: Every day | ORAL | Status: DC
  Administered 2023-09-05 – 2023-09-13 (×9): 17 g via ORAL
  Filled 2023-09-05 (×9): qty 1

## 2023-09-05 MED ORDER — CEFADROXIL 500 MG PO CAPS: 500.0000 mg | ORAL_CAPSULE | Freq: Two times a day (BID) | ORAL | 0 refills | Status: DC

## 2023-09-05 MED ORDER — VITAMIN D 25 MCG (1000 UNIT) PO TABS
1000.0000 [IU] | ORAL_TABLET | Freq: Every morning | ORAL | Status: DC
  Administered 2023-09-06 – 2023-09-16 (×11): 1000 [IU] via ORAL
  Filled 2023-09-05 (×12): qty 1

## 2023-09-05 MED ORDER — ASPIRIN 81 MG PO TBEC: 81.0000 mg | DELAYED_RELEASE_TABLET | Freq: Every day | ORAL | 2 refills | Status: DC

## 2023-09-05 MED ORDER — CLOPIDOGREL BISULFATE 75 MG PO TABS
75.0000 mg | ORAL_TABLET | Freq: Every day | ORAL | Status: DC
Start: 2023-09-06 — End: 2023-09-27
  Administered 2023-09-06 – 2023-09-27 (×22): 75 mg via ORAL
  Filled 2023-09-05 (×22): qty 1

## 2023-09-05 MED ORDER — ACETAMINOPHEN 650 MG RE SUPP: 650.0000 mg | RECTAL | Status: DC | PRN

## 2023-09-05 MED ORDER — VITAMIN B-12 1000 MCG PO TABS
1000.0000 ug | ORAL_TABLET | Freq: Every morning | ORAL | Status: DC
  Administered 2023-09-06 – 2023-09-12 (×7): 1000 ug via ORAL
  Filled 2023-09-05 (×5): qty 1

## 2023-09-05 MED ORDER — ACETAMINOPHEN 160 MG/5ML PO SOLN: 650.0000 mg | ORAL | Status: DC | PRN

## 2023-09-05 MED ORDER — CARVEDILOL 6.25 MG PO TABS
6.2500 mg | ORAL_TABLET | Freq: Two times a day (BID) | ORAL | Status: DC
Start: 2023-09-06 — End: 2023-09-09
  Administered 2023-09-06 – 2023-09-09 (×7): 6.25 mg via ORAL
  Filled 2023-09-05 (×7): qty 1

## 2023-09-05 MED ORDER — ENOXAPARIN SODIUM 30 MG/0.3ML IJ SOSY
30.0000 mg | PREFILLED_SYRINGE | INTRAMUSCULAR | Status: DC
  Administered 2023-09-05 – 2023-09-26 (×22): 30 mg via SUBCUTANEOUS
  Filled 2023-09-05 (×22): qty 0.3

## 2023-09-05 MED ORDER — ENOXAPARIN SODIUM 30 MG/0.3ML IJ SOSY: 30.0000 mg | PREFILLED_SYRINGE | INTRAMUSCULAR | Status: DC

## 2023-09-05 MED ORDER — POTASSIUM CHLORIDE CRYS ER 20 MEQ PO TBCR
40.0000 meq | EXTENDED_RELEASE_TABLET | Freq: Every day | ORAL | Status: DC
  Administered 2023-09-06 – 2023-09-27 (×22): 40 meq via ORAL
  Filled 2023-09-05 (×22): qty 2

## 2023-09-05 MED ORDER — ACETAMINOPHEN 325 MG PO TABS
650.0000 mg | ORAL_TABLET | ORAL | Status: AC | PRN
Start: 2023-09-05 — End: ?

## 2023-09-05 MED ORDER — CLOPIDOGREL BISULFATE 75 MG PO TABS
75.0000 mg | ORAL_TABLET | Freq: Every day | ORAL | 12 refills | Status: DC
Start: 2023-09-06 — End: 2023-09-27

## 2023-09-05 MED ORDER — GABAPENTIN 300 MG PO CAPS
300.0000 mg | ORAL_CAPSULE | Freq: Two times a day (BID) | ORAL | Status: DC
Start: 2023-09-05 — End: 2023-09-12
  Administered 2023-09-05 – 2023-09-12 (×14): 300 mg via ORAL
  Filled 2023-09-05 (×9): qty 1
  Filled 2023-09-05: qty 3
  Filled 2023-09-05 (×4): qty 1

## 2023-09-05 MED ORDER — GLIPIZIDE 5 MG PO TABS
5.0000 mg | ORAL_TABLET | Freq: Every day | ORAL | Status: DC
  Administered 2023-09-06 – 2023-09-07 (×2): 5 mg via ORAL
  Filled 2023-09-05 (×2): qty 1

## 2023-09-05 MED ORDER — ASPIRIN 81 MG PO TBEC
81.0000 mg | DELAYED_RELEASE_TABLET | Freq: Every day | ORAL | Status: DC
Start: 2023-09-06 — End: 2023-09-27
  Administered 2023-09-06 – 2023-09-27 (×22): 81 mg via ORAL
  Filled 2023-09-05 (×22): qty 1

## 2023-09-05 MED ORDER — SENNOSIDES-DOCUSATE SODIUM 8.6-50 MG PO TABS
1.0000 | ORAL_TABLET | Freq: Every day | ORAL | Status: DC
  Administered 2023-09-05 – 2023-09-10 (×6): 1 via ORAL
  Filled 2023-09-05 (×6): qty 1

## 2023-09-05 NOTE — H&P (Signed)
 Physical Medicine and Rehabilitation Admission H&P    Chief Complaint  Patient presents with   Fall   Wrist Pain  : HPI: Allen Dyer is an 88 year old right-handed male with history significant for chronic combined systolic and diastolic heart failure, nonischemic cardiomyopathy followed by Dr. Micael Adas as well as Dr.McLean, CVA, prostate cancer status post XRT/BPH followed by Dr. Doy Gene, GERD, gout, multiple myeloma followed by Dr. Marton Sleeper, nonobstructive CAD, hyperlipidemia, hypertension, diabetes mellitus with peripheral neuropathy and CKD stage IV.  Per chart review patient lives with his daughter.  Patient is able to live on the main level with bedroom and bath.  Reportedly independent prior to admission.  Presented 09/02/2023 with progressive unsteady gait times several weeks as well as falls x 2.  MRI showed patchy early subacute ischemic infarcts involving the right greater than left cerebellar hemispheres.  Associated minimal petechial blood products of the right cerebellum without hemorrhagic transformation.  Pt reports injury to R wrist after fall. X-rays of the right wrist showed no fracture.  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  Irregular reconstitution of the distal V2 segment of the right vertebral artery with occlusion of the right V3 and V4 segments.  Left vertebral artery occluded at the V2 segment from the level of C4-5.  CT cervical spine showed hypodense lesions in the cerebellar hemispheres, measuring 3.4 x 3.2 cm on the right 0.8 cm and 1.8 x 1.1 cm on the left.  No fracture or subluxation.  Admission chemistries unremarkable aside glucose 171 BUN 38 creatinine 2.28, urine culture greater than 100,000 Klebsiella oxytoca, hemoglobin A1c 7.0.  Echocardiogram with ejection fraction of 25 to 30% grade 1 diastolic dysfunction left ventricle demonstrating global hypokinesis.  Neurology follow-up placed on aspirin  as well as Plavix  for CVA prophylaxis x 3 months then  Plavix  alone.  Lovenox  added for DVT prophylaxis.  Reports chronic bladder urgency. Patient received 2 days of Rocephin for UTI transitioned to Duricef.  Tolerating a regular consistency diet.  Therapy evaluations completed due to patient's decreased functional mobility was admitted for a comprehensive rehab program.  Review of Systems  Constitutional:  Negative for chills and fever.  HENT:  Negative for hearing loss.   Eyes:  Negative for blurred vision and double vision.       Altered vision when looking Right then back again  Respiratory:  Negative for cough, shortness of breath and wheezing.   Cardiovascular:  Positive for leg swelling. Negative for chest pain and palpitations.  Gastrointestinal:  Positive for constipation. Negative for heartburn, nausea and vomiting.  Genitourinary:  Positive for urgency. Negative for dysuria, flank pain and hematuria.  Musculoskeletal:  Positive for falls and joint pain.  Skin:  Negative for rash.  Neurological:  Positive for dizziness and weakness. Negative for sensory change.  All other systems reviewed and are negative.  Past Medical History:  Diagnosis Date   Chronic combined systolic and diastolic CHF (congestive heart failure) (HCC)    CKD (chronic kidney disease), stage III (HCC)    Hyperlipidemia    Hypertension    Lower extremity edema    Mild CAD    a. mild-mod by cath 05/2018.   Multiple myeloma (HCC) 11/15/2018   NICM (nonischemic cardiomyopathy) (HCC)    Peripheral neuropathy    Prostate cancer (HCC)    Status post XRT   Rheumatic fever    Stroke [facility])    Trifascicular block    Type 2 diabetes mellitus (HCC)    Vertigo  Past Surgical History:  Procedure Laterality Date   RIGHT HEART CATH N/A 07/10/2018   Procedure: RIGHT HEART CATH;  Surgeon: Darlis Eisenmenger, MD;  Location: [facility] INVASIVE CV LAB;  Service: Cardiovascular;  Laterality: N/A;   RIGHT/LEFT HEART CATH AND CORONARY ANGIOGRAPHY N/A 06/18/2018   Procedure: RIGHT/LEFT  HEART CATH AND CORONARY ANGIOGRAPHY;  Surgeon: Millicent Ally, MD;  Location: [facility] INVASIVE CV LAB;  Service: Cardiovascular;  Laterality: N/A;   Family History  Problem Relation Age of Onset   Cancer Mother    Congestive Heart Failure Father        died from heart failure at the age of 44   Cancer Sister    Congestive Heart Failure Brother        died from heart failure at the age of 69   Stroke Neg Hx    Social History:  reports that he quit smoking about 52 years ago. His smoking use included cigarettes. He started smoking about 62 years ago. He has a 20 pack-year smoking history. He has never used smokeless tobacco. He reports that he does not currently use alcohol. He reports that he does not use drugs. Allergies:  Allergies  Allergen Reactions   Tramadol Other (See Comments)    Dizziness (intolerance) hallucinate   Finasteride Swelling    Breast enlargement    Jardiance  [Empagliflozin ]     Yeast infection   Lisinopril Cough   Ciprofloxacin Other (See Comments)    Dizziness (intolerance)    Simvastatin Hives and Other (See Comments)    Blisters/ Rash    Sulfa Antibiotics Rash   Medications Prior to Admission  Medication Sig Dispense Refill   Ascorbic Acid (VITAMIN C) 1000 MG tablet Take 4,000 mg by mouth every morning.     aspirin  81 MG chewable tablet Chew 1 tablet (81 mg total) by mouth daily. 30 tablet 11   carvedilol  (COREG ) 12.5 MG tablet Take 6.25 mg by mouth 2 (two) times daily with a meal.     Cholecalciferol (VITAMIN D3 PO) Take 1 capsule by mouth every morning.     cyanocobalamin  1000 MCG tablet Take 1,000 mcg by mouth every morning.     gabapentin  (NEURONTIN ) 600 MG tablet Take 300 mg by mouth 2 (two) times daily.     Garlic 1000 MG CAPS Take 1,000 mg by mouth every morning.     glipiZIDE  (GLUCOTROL ) 5 MG tablet Take 5 mg by mouth daily before breakfast.     hydrALAZINE  (APRESOLINE ) 50 MG tablet Take 1 tablet (50 mg total) by mouth 3 (three) times daily. 6am,  2pm and 10pm (Patient taking differently: Take 50 mg by mouth 2 (two) times daily. 6am, 2pm and 10pm)     isosorbide  mononitrate (IMDUR ) 30 MG 24 hr tablet Take 1 tablet (30 mg total) by mouth every morning.     meclizine  (ANTIVERT ) 12.5 MG tablet Take 2 tablets (25 mg total) by mouth 2 (two) times daily as needed for dizziness. 60 tablet 0   Omega-3 Fatty Acids (FISH OIL) 1200 MG CAPS Take 1,200 mg by mouth daily.     Oyster Shell Calcium  500 MG TABS Take 500 mg by mouth daily.     potassium chloride  SA (KLOR-CON  M) 20 MEQ tablet Take 2 tablets (40 mEq total) by mouth daily. (Patient taking differently: Take 20 mEq by mouth daily.) 180 tablet 3   tamsulosin  (FLOMAX ) 0.4 MG CAPS capsule Take 0.4 mg by mouth 2 (two) times daily.  torsemide  (DEMADEX ) 20 MG tablet Take 3 tablets (60 mg total) by mouth 2 (two) times daily. 180 tablet 5   Blood Glucose Monitoring Suppl (FREESTYLE LITE) w/Device KIT 1 Device by Does not apply route daily in the afternoon. 1 kit 0   carvedilol  (COREG ) 6.25 MG tablet Take 1 tablet (6.25 mg total) by mouth 2 (two) times daily with a meal. (Patient not taking: Reported on 09/02/2023) 180 tablet 3   eplerenone  (INSPRA ) 50 MG tablet Take 1 tablet (50 mg total) by mouth daily. (Patient not taking: Reported on 09/02/2023) 90 tablet 3   ezetimibe  (ZETIA ) 10 MG tablet Take 1 tablet (10 mg total) by mouth daily. (Patient not taking: Reported on 09/02/2023) 30 tablet 3   glucose blood (FREESTYLE LITE) test strip 1 each by Other route daily in the afternoon. Use as instructed 100 each 3   pantoprazole  (PROTONIX ) 40 MG tablet Take 1 tablet (40 mg total) by mouth daily. (Patient not taking: Reported on 08/16/2023) 30 tablet 2      Home: Home Living Family/patient expects to be discharged to:: Private residence Living Arrangements: Children Available Help at Discharge: Family, Available 24 hours/day Type of Home: House Home Access: Stairs to enter Secretary/administrator of Steps:  5 Entrance Stairs-Rails: Can reach both Home Layout: Two level, Able to live on main level with bedroom/bathroom, Bed/bath upstairs Alternate Level Stairs-Number of Steps: 10 (very steep per daughter) Alternate Level Stairs-Rails: Left Bathroom Shower/Tub: Tub/shower unit (Only shower is upstairs) Firefighter: Handicapped height Bathroom Accessibility: Yes Home Equipment: Cane - single point   Functional History: Prior Function Prior Level of Function : Independent/Modified Independent, History of Falls (last six months) (2 falls in the last 6 months. Mechanical in nature) Mobility Comments: Ind ADLs Comments: Ind (hx of urinary issues - had been scheduled with urology to have a procedure for a "blockage"; wears C-pap at night)  Functional Status:  Mobility: Bed Mobility Overal bed mobility: Needs Assistance Bed Mobility: Supine to Sit Supine to sit: Min assist Sit to supine: Contact guard assist General bed mobility comments: Pt able to execute with increased time, assist with bed pad to scoot hips forward to edge Transfers Overall transfer level: Needs assistance Equipment used: Quad cane Transfers: Sit to/from Stand Sit to Stand: Min assist General transfer comment: Assist to power up and steady Ambulation/Gait Ambulation/Gait assistance: Min assist Gait Distance (Feet): 75 Feet Assistive device: Quad cane Gait Pattern/deviations: Trunk flexed, Decreased stride length, Wide base of support, Step-to pattern, Shuffle General Gait Details: Verbal instruction for quad cane use, larger step lengths, upright posture. Pt with shuffling gait pattern with decreased bilateral foot clearance, tendency for downward gaze. Gait velocity: decreased    ADL: ADL Overall ADL's : Needs assistance/impaired Eating/Feeding: Set up Grooming: Set up, Sitting Upper Body Bathing: Minimal assistance, Sitting Lower Body Bathing: Moderate assistance, Sit to/from stand Upper Body Dressing :  Minimal assistance, Sitting Lower Body Dressing: Moderate assistance, Sit to/from stand Toilet Transfer: Minimal assistance, Stand-pivot Toileting- Clothing Manipulation and Hygiene: Maximal assistance Functional mobility during ADLs: Minimal assistance General ADL Comments: very unsteady; requires external support  Cognition: Cognition Orientation Level: Oriented X4 Cognition Arousal: Alert Behavior During Therapy: WFL for tasks assessed/performed  Physical Exam: Blood pressure 136/80, pulse 79, temperature 98.7 F (37.1 C), temperature source Oral, resp. rate 20, height 5\' 7"  (1.702 m), weight 97.5 kg, SpO2 91%. Physical Exam Neurological:     Comments: Patient is alert and oriented x 3.  Mild dysarthria but fully  intelligible.  Follows commands     General: No apparent distress, sitting bedside chair HEENT: Head is normocephalic, atraumatic, sclera anicteric, oral mucosa pink and moist, dentition decreased in upper teeth Neck: Supple without JVD or lymphadenopathy Heart: Reg rate and rhythm. No murmurs rubs or gallops Chest: CTA bilaterally without wheezes, rales, or rhonchi; no distress Abdomen: Soft, non-tender, non-distended, bowel sounds positive but hypoactive Extremities: No clubbing, cyanosis, or edema. Pulses are 2+ Psych: Pt's affect is a little flat Skin: Clean and intact without signs of breakdown Neuro:    Mental Status: AAO to person, place, month and day, says 2015 as year, responses a little delayed, able to say his date of birth  Speech/Languate: Naming and repetition intact,Mild dysarthria but fully intelligible, follows simple commands CRANIAL NERVES: II: PERRL. Visual fields full III, IV, VI: EOM intact, no gaze preference or deviation V: normal sensation bilaterally VII: no asymmetry VIII: normal hearing to speech IX, X: normal palatal elevation XI: 5/5 head turn and 5/5 shoulder shrug bilaterally XII: Tongue midline   MOTOR: LUE: 4+/5 Deltoid,  4+/5 Biceps, 4+/5 Triceps,4+/5 Grip RUE: 3/5 Deltoid, 3/5 Biceps, 3/5 Triceps, 3/5 Grip  LLE: HF 4/5, KE 4+/5, ADF 4/5, APF 4/5 RLE: HF 4/5, KE 4+/5, ADF 4/5, APF 4/5  REFLEXES: 2/4 throughout, bilateral flexor plantar response, no Hoffman's, no clonus  SENSORY: Normal to touch all 4 extremities  Coordination: Normal finger to nose LUE  No hypertonia noted  MSK: R wrist ttp, wearing wrist brace    Results for orders placed or performed during the hospital encounter of 09/02/23 (from the past 48 hours)  Glucose, capillary     Status: Abnormal   Collection Time: 09/03/23 12:00 PM  Result Value Ref Range   Glucose-Capillary 169 (H) 70 - 99 mg/dL    Comment: Glucose reference range applies only to samples taken after fasting for at least 8 hours.   Comment 1 Notify RN    Comment 2 Document in Chart   Glucose, capillary     Status: Abnormal   Collection Time: 09/03/23  4:24 PM  Result Value Ref Range   Glucose-Capillary 141 (H) 70 - 99 mg/dL    Comment: Glucose reference range applies only to samples taken after fasting for at least 8 hours.   Comment 1 Notify RN    Comment 2 Document in Chart   Glucose, capillary     Status: Abnormal   Collection Time: 09/03/23  9:32 PM  Result Value Ref Range   Glucose-Capillary 148 (H) 70 - 99 mg/dL    Comment: Glucose reference range applies only to samples taken after fasting for at least 8 hours.   Comment 1 Notify RN   Basic metabolic panel with GFR     Status: Abnormal   Collection Time: 09/04/23  5:58 AM  Result Value Ref Range   Sodium 140 135 - 145 mmol/L   Potassium 3.6 3.5 - 5.1 mmol/L   Chloride 101 98 - 111 mmol/L   CO2 26 22 - 32 mmol/L   Glucose, Bld 141 (H) 70 - 99 mg/dL    Comment: Glucose reference range applies only to samples taken after fasting for at least 8 hours.   BUN 38 (H) 8 - 23 mg/dL   Creatinine, Ser 0.98 (H) 0.61 - 1.24 mg/dL   Calcium  9.0 8.9 - 10.3 mg/dL   GFR, Estimated 22 (L) >60 mL/min    Comment:  (NOTE) Calculated using the CKD-EPI Creatinine Equation (2021)  Anion gap 13 5 - 15    Comment: Performed at [facility], [address] [place], [place] [address]  Glucose, capillary     Status: Abnormal   Collection Time: 09/04/23  6:25 AM  Result Value Ref Range   Glucose-Capillary 139 (H) 70 - 99 mg/dL    Comment: Glucose reference range applies only to samples taken after fasting for at least 8 hours.   Comment 1 Notify RN   Glucose, capillary     Status: Abnormal   Collection Time: 09/04/23 11:45 AM  Result Value Ref Range   Glucose-Capillary 201 (H) 70 - 99 mg/dL    Comment: Glucose reference range applies only to samples taken after fasting for at least 8 hours.  Glucose, capillary     Status: Abnormal   Collection Time: 09/04/23  4:34 PM  Result Value Ref Range   Glucose-Capillary 202 (H) 70 - 99 mg/dL    Comment: Glucose reference range applies only to samples taken after fasting for at least 8 hours.  Glucose, capillary     Status: Abnormal   Collection Time: 09/04/23  9:02 PM  Result Value Ref Range   Glucose-Capillary 174 (H) 70 - 99 mg/dL    Comment: Glucose reference range applies only to samples taken after fasting for at least 8 hours.   Comment 1 Notify RN    Comment 2 Document in Chart   Glucose, capillary     Status: Abnormal   Collection Time: 09/05/23  6:12 AM  Result Value Ref Range   Glucose-Capillary 170 (H) 70 - 99 mg/dL    Comment: Glucose reference range applies only to samples taken after fasting for at least 8 hours.   Comment 1 Notify RN    Comment 2 Document in Chart    DG Elbow 2 Views Right Result Date: 09/04/2023 CLINICAL DATA:  Fall.  Elbow pain. EXAM: RIGHT ELBOW - 2 VIEW COMPARISON:  None Available. FINDINGS: Mild medial elbow joint space narrowing at the trochlea-coronoid articulation. Mild chronic enthesopathic changes at the common flexor tendon origin at the medial epicondyle and common extensor tendon origin at the lateral  epicondyle. There is elevation of the distal humeral fat pad suggesting an elbow joint effusion. Mild to moderate degenerative spurring of the tip of the coronoid process. Mild to moderate chronic enthesopathic change at the triceps insertion on the olecranon. No acute fracture or dislocation. IMPRESSION: 1. Mild medial elbow osteoarthritis. 2. Mild to moderate chronic enthesopathic change at the triceps insertion on the olecranon. 3. Elevation of the distal humeral fat pad suggesting an elbow joint effusion. Electronically Signed   By: Bertina Broccoli M.D.   On: 09/04/2023 15:48   ECHOCARDIOGRAM COMPLETE Result Date: 09/03/2023    ECHOCARDIOGRAM REPORT   Patient Name:   Allen Dyer Date of Exam: 09/03/2023 Medical Rec #:  [id number]     Height:       67.0 in Accession #:    [id number]    Weight:       214.9 lb Date of Birth:  Sep 02, 1935     BSA:          2.085 m Patient Age:    88 years      BP:           131/81 mmHg Patient Gender: M             HR:           67 bpm. Exam Location:  Inpatient  Procedure: 2D Echo, Color Doppler, Cardiac Doppler and Intracardiac            Opacification Agent (Both Spectral and Color Flow Doppler were            utilized during procedure). Indications:    Stroke I63.9  History:        Patient has prior history of Echocardiogram examinations, most                 recent 09/12/2021.  Sonographer:    Hersey Lorenzo RDCS Referring Phys: [phone] PROSPER M AMPONSAH IMPRESSIONS  1. Left ventricular ejection fraction, by estimation, is 25 to 30%. Left ventricular ejection fraction by 2D MOD biplane is 30.8 %. The left ventricle has severely decreased function. The left ventricle demonstrates global hypokinesis. There is moderate  concentric left ventricular hypertrophy. Left ventricular diastolic parameters are consistent with Grade I diastolic dysfunction (impaired relaxation).  2. Right ventricular systolic function is normal. The right ventricular size is normal. Tricuspid regurgitation  signal is inadequate for assessing PA pressure.  3. Left atrial size was mildly dilated.  4. The mitral valve is normal in structure. Trivial mitral valve regurgitation. No evidence of mitral stenosis.  5. The aortic valve is tricuspid. There is mild calcification of the aortic valve. Aortic valve regurgitation is mild.  6. Aortic dilatation noted. There is mild dilatation of the aortic root, measuring 40 mm. There is mild dilatation of the ascending aorta, measuring 43 mm.  7. The inferior vena cava is normal in size with greater than 50% respiratory variability, suggesting right atrial pressure of 3 mmHg. FINDINGS  Left Ventricle: Left ventricular ejection fraction, by estimation, is 25 to 30%. Left ventricular ejection fraction by 2D MOD biplane is 30.8 %. The left ventricle has severely decreased function. The left ventricle demonstrates global hypokinesis. The left ventricular internal cavity size was normal in size. There is moderate concentric left ventricular hypertrophy. Left ventricular diastolic parameters are consistent with Grade I diastolic dysfunction (impaired relaxation). Right Ventricle: The right ventricular size is normal. No increase in right ventricular wall thickness. Right ventricular systolic function is normal. Tricuspid regurgitation signal is inadequate for assessing PA pressure. Left Atrium: Left atrial size was mildly dilated. Right Atrium: Right atrial size was normal in size. Pericardium: There is no evidence of pericardial effusion. Mitral Valve: The mitral valve is normal in structure. Trivial mitral valve regurgitation. No evidence of mitral valve stenosis. Tricuspid Valve: The tricuspid valve is normal in structure. Tricuspid valve regurgitation is trivial. Aortic Valve: The aortic valve is tricuspid. There is mild calcification of the aortic valve. Aortic valve regurgitation is mild. Aortic regurgitation PHT measures 851 msec. Pulmonic Valve: The pulmonic valve was normal in  structure. Pulmonic valve regurgitation is not visualized. Aorta: Aortic dilatation noted. There is mild dilatation of the aortic root, measuring 40 mm. There is mild dilatation of the ascending aorta, measuring 43 mm. Venous: The inferior vena cava is normal in size with greater than 50% respiratory variability, suggesting right atrial pressure of 3 mmHg. IAS/Shunts: No atrial level shunt detected by color flow Doppler.  LEFT VENTRICLE PLAX 2D                        Biplane EF (MOD) LVIDd:         5.20 cm         LV Biplane EF:   Left LVIDs:         4.50 cm  ventricular LV PW:         1.10 cm                          ejection LV IVS:        1.10 cm                          fraction by LVOT diam:     2.60 cm                          2D MOD LV SV:         66                               biplane is LV SV Index:   32                               30.8 %. LVOT Area:     5.31 cm                                Diastology                                LV e' lateral:   3.41 cm/s LV Volumes (MOD)               LV E/e' lateral: 14.1 LV vol d, MOD    157.0 ml A2C: LV vol d, MOD    199.0 ml A4C: LV vol s, MOD    110.0 ml A2C: LV vol s, MOD    134.0 ml A4C: LV SV MOD A2C:   47.0 ml LV SV MOD A4C:   199.0 ml LV SV MOD BP:    54.5 ml RIGHT VENTRICLE             IVC RV S prime:     11.10 cm/s  IVC diam: 1.20 cm TAPSE (M-mode): 2.2 cm LEFT ATRIUM           Index        RIGHT ATRIUM           Index LA diam:      2.30 cm 1.10 cm/m   RA Area:     15.80 cm LA Vol (A4C): 40.3 ml 19.33 ml/m  RA Volume:   40.60 ml  19.47 ml/m  AORTIC VALVE LVOT Vmax:   75.90 cm/s LVOT Vmean:  56.200 cm/s LVOT VTI:    0.124 m AI PHT:      851 msec  AORTA Ao Root diam: 4.00 cm Ao Asc diam:  4.30 cm MITRAL VALVE MV Area (PHT): 4.08 cm    SHUNTS MV Decel Time: 186 msec    Systemic VTI:  0.12 m MV E velocity: 48.00 cm/s  Systemic Diam: 2.60 cm MV A velocity: 67.40 cm/s MV E/A ratio:  0.71 Dalton McleanMD Electronically signed by  Archer Bear Signature Date/Time: 09/03/2023/7:34:48 PM    Final       Blood pressure 136/80, pulse 79, temperature 98.7 F (37.1 C), temperature source Oral, resp. rate 20, height 5\' 7"  (1.702 m), weight 97.5 kg, SpO2 91%.  Medical Problem List  and Plan: 1. Functional deficits secondary to bilateral cerebellar infarct, right more than left secondary large vessel disease from severe posterior circulation stenosis  -patient may shower  -ELOS/Goals: 10-12,PT/OT sup to mod I  -Admit to CIR 2.  Antithrombotics: -DVT/anticoagulation:  Pharmaceutical: Lovenox   -antiplatelet therapy: Aspirin  81 mg daily and Plavix  75 mg day x 3 months then Plavix  alone 3. Pain Management: Neurontin  300 mg twice daily 4. Mood/Behavior/Sleep: Provide emotional support  -antipsychotic agents: N/A 5. Neuropsych/cognition: This patient is capable of making decisions on his own behalf. 6. Skin/Wound Care: Routine skin checks 7. Fluids/Electrolytes/Nutrition: Routine in and outs with follow-up chemistries 8.  Klebsiella oxytoca UTI.  Completing course of Duricef 9.  CKD stage IV.  Follow-up chemistries 10.  Chronic combined systolic and diastolic congestive heart failure. EF 25-30% Monitor for any signs of fluid overload.  Follow-up cardiology services.  Continue Demadex  60 mg twice daily 11.  Nonischemic cardiomyopathy.  Followed by cardiology service Dr. Micael Adas.  Continue Imdur  30 mg daily and Coreg  6.25 mg twice daily. 12.  History of prostate cancer, BPH.  Followed by Dr. Freddi Jaeger.  Flomax  0.4 mg twice daily  -Check PVR/bladder scan 13.  Diabetes mellitus with peripheral neuropathy and hemoglobin A1c 7.0.  Glucotrol  5 mg daily 14. HTN. Long term goal normotensive. Home meds carvedilol , imdur , toresemide 15. Obesity. Body mass index is 33.67 kg/m.  -dietary education 16. R wrist pain due to fall. Wearing wrist brace. Xray without fracture.  17. Constipation   Sterling Eisenmenger, PA-C 09/05/2023  I have  personally performed a face to face diagnostic evaluation of this patient and formulated the key components of the plan.  Additionally, I have personally reviewed laboratory data, imaging studies, as well as relevant notes and concur with the physician assistant's documentation above.  The patient's status has not changed from the original H&P.  Any changes in documentation from the acute care chart have been noted above.  Lylia Sand, MD

## 2023-09-05 NOTE — Progress Notes (Signed)
 Physical Therapy Treatment Patient Details Name: Allen Dyer MRN: [id number] DOB: [birth year] Today's Date: 09/05/2023   History of Present Illness Allen Dyer is a 88 y.o. male who presents with unsteady gait and fall, sustaining a R wrist injury - xray demonstrates Generalized subcutaneous edema; No fracture or subluxation of the  right wrist.  MRI (+) Patchy early subacute ischemic infarcts involving the right  greater than left cerebellar hemispheres. Past medical hx of hypertension, hyperlipidemia, nonischemic cardiomyopathy, diabetes.    PT Comments  Pt tolerated treatment well today. Pt today required significantly more assistance with bed mobility and sit to stand compared to previous session. Once upright pt was able to ambulate short distance in hallway with cane Min/Mod A. No change in DC/DME recs at this time. Pt anticipates DC to CIR today. PT will continue to follow.     If plan is discharge home, recommend the following: A little help with walking and/or transfers;A little help with bathing/dressing/bathroom;Help with stairs or ramp for entrance;Supervision due to cognitive status;Assist for transportation;Assistance with cooking/housework   Can travel by private vehicle        Equipment Recommendations  Other (comment)    Recommendations for Other Services       Precautions / Restrictions Precautions Precautions: Fall Recall of Precautions/Restrictions: Intact Precaution/Restrictions Comments: Urinary incontinence Required Braces or Orthoses: Splint/Cast Splint/Cast: R wrist cock up splint Splint/Cast - Date Prophylactic Dressing Applied (if applicable): 09/03/23 Restrictions Weight Bearing Restrictions Per Provider Order: No     Mobility  Bed Mobility Overal bed mobility: Needs Assistance Bed Mobility: Supine to Sit, Sit to Supine     Supine to sit: Mod assist Sit to supine: Mod assist   General bed mobility comments: Pt able to execute with increased  time, assist with bed pad to scoot hips forward to edge. Mod A to scoot and for BLE management.    Transfers Overall transfer level: Needs assistance Equipment used: Quad cane Transfers: Sit to/from Stand Sit to Stand: Max assist, From elevated surface           General transfer comment: Assist to power up and steady    Ambulation/Gait Ambulation/Gait assistance: Min assist, Mod assist Gait Distance (Feet): 60 Feet Assistive device: Quad cane Gait Pattern/deviations: Trunk flexed, Decreased stride length, Wide base of support, Step-to pattern, Shuffle Gait velocity: decreased     General Gait Details: Verbal instruction for quad cane use, larger step lengths, upright posture. Pt with shuffling gait pattern with decreased bilateral foot clearance, tendency for downward gaze.   Stairs             Wheelchair Mobility     Tilt Bed    Modified Rankin (Stroke Patients Only) Modified Rankin (Stroke Patients Only) Pre-Morbid Rankin Score: No symptoms Modified Rankin: Moderately severe disability     Balance Overall balance assessment: Needs assistance, History of Falls Sitting-balance support: Feet supported, Single extremity supported Sitting balance-Leahy Scale: Good Sitting balance - Comments: EOB   Standing balance support: Single extremity supported, During functional activity Standing balance-Leahy Scale: Poor Standing balance comment: Reliant on external support.                            Communication Communication Communication: No apparent difficulties  Cognition Arousal: Alert Behavior During Therapy: WFL for tasks assessed/performed   PT - Cognitive impairments: Attention, Problem solving  PT - Cognition Comments: Pt was alert and oriented x4 however some slow processing and problem solving issues were noted. Following commands: Impaired Following commands impaired: Follows one step commands with  increased time    Cueing Cueing Techniques: Verbal cues, Tactile cues, Visual cues  Exercises      General Comments General comments (skin integrity, edema, etc.): VSS      Pertinent Vitals/Pain Pain Assessment Pain Assessment: Faces Faces Pain Scale: Hurts little more Pain Location: R wrist Pain Descriptors / Indicators: Sharp Pain Intervention(s): Monitored during session, Limited activity within patient's tolerance    Home Living                          Prior Function            PT Goals (current goals can now be found in the care plan section) Progress towards PT goals: Progressing toward goals    Frequency    Min 3X/week      PT Plan      Co-evaluation              AM-PAC PT "6 Clicks" Mobility   Outcome Measure  Help needed turning from your back to your side while in a flat bed without using bedrails?: A Little Help needed moving from lying on your back to sitting on the side of a flat bed without using bedrails?: A Little Help needed moving to and from a bed to a chair (including a wheelchair)?: A Little Help needed standing up from a chair using your arms (e.g., wheelchair or bedside chair)?: A Little Help needed to walk in hospital room?: A Little Help needed climbing 3-5 steps with a railing? : A Lot 6 Click Score: 17    End of Session Equipment Utilized During Treatment: Gait belt Activity Tolerance: Patient tolerated treatment well Patient left: in bed;with call bell/phone within reach;with bed alarm set;with family/visitor present Nurse Communication: Mobility status PT Visit Diagnosis: Other abnormalities of gait and mobility (R26.89);Repeated falls (R29.6)     Time: 1914-7829 PT Time Calculation (min) (ACUTE ONLY): 36 min  Charges:    $Gait Training: 23-37 mins PT General Charges $$ ACUTE PT VISIT: 1 Visit                     Regie Bunner B, PT, DPT Acute Rehab Services [phone]    Ople Girgis 09/05/2023, 1:41  PM

## 2023-09-05 NOTE — Care Management Important Message (Signed)
 Important Message  Patient Details  Name: Allen Dyer MRN: [id number] Date of Birth: 03-Oct-1935   Important Message Given:  Yes - Medicare IM     Wynonia Hedges 09/05/2023, 3:01 PM

## 2023-09-05 NOTE — Progress Notes (Signed)
 Inpatient Rehab Admissions Coordinator:   I have a bed for this patient to admit to CIR today.  Dr. Nichole Barker in agreement and [facility] aware.  I've spoken to patient and his daughter and they are in agreement to admit to CIR today.  I will make arrangements.    Loye Rumble, PT, DPT Admissions Coordinator [phone] 09/05/23  11:24 AM

## 2023-09-05 NOTE — H&P (Signed)
 Physical Medicine and Rehabilitation Admission H&P        Chief Complaint  Patient presents with   Fall   Wrist Pain  : HPI: Allen Dyer is an 88 year old right-handed male with history significant for chronic combined systolic and diastolic heart failure, nonischemic cardiomyopathy followed by Dr. Micael Adas as well as Dr.McLean, CVA, prostate cancer status post XRT/BPH followed by Dr. Doy Gene, GERD, gout, multiple myeloma followed by Dr. Marton Sleeper, nonobstructive CAD, hyperlipidemia, hypertension, diabetes mellitus with peripheral neuropathy and CKD stage IV.  Per chart review patient lives with his daughter.  Patient is able to live on the main level with bedroom and bath.  Reportedly independent prior to admission.  Presented 09/02/2023 with progressive unsteady gait times several weeks as well as falls x 2.  MRI showed patchy early subacute ischemic infarcts involving the right greater than left cerebellar hemispheres.  Associated minimal petechial blood products of the right cerebellum without hemorrhagic transformation.  Pt reports injury to R wrist after fall. X-rays of the right wrist showed no fracture.  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  Irregular reconstitution of the distal V2 segment of the right vertebral artery with occlusion of the right V3 and V4 segments.  Left vertebral artery occluded at the V2 segment from the level of C4-5.  CT cervical spine showed hypodense lesions in the cerebellar hemispheres, measuring 3.4 x 3.2 cm on the right 0.8 cm and 1.8 x 1.1 cm on the left.  No fracture or subluxation.  Admission chemistries unremarkable aside glucose 171 BUN 38 creatinine 2.28, urine culture greater than 100,000 Klebsiella oxytoca, hemoglobin A1c 7.0.  Echocardiogram with ejection fraction of 25 to 30% grade 1 diastolic dysfunction left ventricle demonstrating global hypokinesis.  Neurology follow-up placed on aspirin  as well as Plavix  for CVA prophylaxis x 3  months then Plavix  alone.  Lovenox  added for DVT prophylaxis.  Reports chronic bladder urgency. Patient received 2 days of Rocephin for UTI transitioned to Duricef.  Tolerating a regular consistency diet.  Therapy evaluations completed due to patient's decreased functional mobility was admitted for a comprehensive rehab program.   Review of Systems  Constitutional:  Negative for chills and fever.  HENT:  Negative for hearing loss.   Eyes:  Negative for blurred vision and double vision.       Altered vision when looking Right then back again  Respiratory:  Negative for cough, shortness of breath and wheezing.   Cardiovascular:  Positive for leg swelling. Negative for chest pain and palpitations.  Gastrointestinal:  Positive for constipation. Negative for heartburn, nausea and vomiting.  Genitourinary:  Positive for urgency. Negative for dysuria, flank pain and hematuria.  Musculoskeletal:  Positive for falls and joint pain.  Skin:  Negative for rash.  Neurological:  Positive for dizziness and weakness. Negative for sensory change.  All other systems reviewed and are negative.       Past Medical History:  Diagnosis Date   Chronic combined systolic and diastolic CHF (congestive heart failure) (HCC)     CKD (chronic kidney disease), stage III (HCC)     Hyperlipidemia     Hypertension     Lower extremity edema     Mild CAD      a. mild-mod by cath 05/2018.   Multiple myeloma (HCC) 11/15/2018   NICM (nonischemic cardiomyopathy) (HCC)     Peripheral neuropathy     Prostate cancer (HCC)      Status post XRT  Rheumatic fever     Stroke [facility])     Trifascicular block     Type 2 diabetes mellitus (HCC)     Vertigo               Past Surgical History:  Procedure Laterality Date   RIGHT HEART CATH N/A 07/10/2018    Procedure: RIGHT HEART CATH;  Surgeon: Darlis Eisenmenger, MD;  Location: [facility] INVASIVE CV LAB;  Service: Cardiovascular;  Laterality: N/A;   RIGHT/LEFT HEART CATH AND CORONARY  ANGIOGRAPHY N/A 06/18/2018    Procedure: RIGHT/LEFT HEART CATH AND CORONARY ANGIOGRAPHY;  Surgeon: Millicent Ally, MD;  Location: [facility] INVASIVE CV LAB;  Service: Cardiovascular;  Laterality: N/A;             Family History  Problem Relation Age of Onset   Cancer Mother     Congestive Heart Failure Father          died from heart failure at the age of 86   Cancer Sister     Congestive Heart Failure Brother          died from heart failure at the age of 39   Stroke Neg Hx          Social History:  reports that he quit smoking about 52 years ago. His smoking use included cigarettes. He started smoking about 62 years ago. He has a 20 pack-year smoking history. He has never used smokeless tobacco. He reports that he does not currently use alcohol. He reports that he does not use drugs. Allergies:  Allergies       Allergies  Allergen Reactions   Tramadol Other (See Comments)      Dizziness (intolerance) hallucinate   Finasteride Swelling      Breast enlargement     Jardiance  [Empagliflozin ]        Yeast infection   Lisinopril Cough   Ciprofloxacin Other (See Comments)      Dizziness (intolerance)     Simvastatin Hives and Other (See Comments)      Blisters/ Rash     Sulfa Antibiotics Rash            Medications Prior to Admission  Medication Sig Dispense Refill   Ascorbic Acid (VITAMIN C) 1000 MG tablet Take 4,000 mg by mouth every morning.       aspirin  81 MG chewable tablet Chew 1 tablet (81 mg total) by mouth daily. 30 tablet 11   carvedilol  (COREG ) 12.5 MG tablet Take 6.25 mg by mouth 2 (two) times daily with a meal.       Cholecalciferol (VITAMIN D3 PO) Take 1 capsule by mouth every morning.       cyanocobalamin  1000 MCG tablet Take 1,000 mcg by mouth every morning.       gabapentin  (NEURONTIN ) 600 MG tablet Take 300 mg by mouth 2 (two) times daily.       Garlic 1000 MG CAPS Take 1,000 mg by mouth every morning.       glipiZIDE  (GLUCOTROL ) 5 MG tablet Take 5 mg by  mouth daily before breakfast.       hydrALAZINE  (APRESOLINE ) 50 MG tablet Take 1 tablet (50 mg total) by mouth 3 (three) times daily. 6am, 2pm and 10pm (Patient taking differently: Take 50 mg by mouth 2 (two) times daily. 6am, 2pm and 10pm)       isosorbide  mononitrate (IMDUR ) 30 MG 24 hr tablet Take 1 tablet (30 mg total) by mouth every morning.  meclizine  (ANTIVERT ) 12.5 MG tablet Take 2 tablets (25 mg total) by mouth 2 (two) times daily as needed for dizziness. 60 tablet 0   Omega-3 Fatty Acids (FISH OIL) 1200 MG CAPS Take 1,200 mg by mouth daily.       Oyster Shell Calcium  500 MG TABS Take 500 mg by mouth daily.       potassium chloride  SA (KLOR-CON  M) 20 MEQ tablet Take 2 tablets (40 mEq total) by mouth daily. (Patient taking differently: Take 20 mEq by mouth daily.) 180 tablet 3   tamsulosin  (FLOMAX ) 0.4 MG CAPS capsule Take 0.4 mg by mouth 2 (two) times daily.        torsemide  (DEMADEX ) 20 MG tablet Take 3 tablets (60 mg total) by mouth 2 (two) times daily. 180 tablet 5   Blood Glucose Monitoring Suppl (FREESTYLE LITE) w/Device KIT 1 Device by Does not apply route daily in the afternoon. 1 kit 0   carvedilol  (COREG ) 6.25 MG tablet Take 1 tablet (6.25 mg total) by mouth 2 (two) times daily with a meal. (Patient not taking: Reported on 09/02/2023) 180 tablet 3   eplerenone  (INSPRA ) 50 MG tablet Take 1 tablet (50 mg total) by mouth daily. (Patient not taking: Reported on 09/02/2023) 90 tablet 3   ezetimibe  (ZETIA ) 10 MG tablet Take 1 tablet (10 mg total) by mouth daily. (Patient not taking: Reported on 09/02/2023) 30 tablet 3   glucose blood (FREESTYLE LITE) test strip 1 each by Other route daily in the afternoon. Use as instructed 100 each 3   pantoprazole  (PROTONIX ) 40 MG tablet Take 1 tablet (40 mg total) by mouth daily. (Patient not taking: Reported on 08/16/2023) 30 tablet 2              Home: Home Living Family/patient expects to be discharged to:: Private residence Living  Arrangements: Children Available Help at Discharge: Family, Available 24 hours/day Type of Home: House Home Access: Stairs to enter Secretary/administrator of Steps: 5 Entrance Stairs-Rails: Can reach both Home Layout: Two level, Able to live on main level with bedroom/bathroom, Bed/bath upstairs Alternate Level Stairs-Number of Steps: 10 (very steep per daughter) Alternate Level Stairs-Rails: Left Bathroom Shower/Tub: Tub/shower unit (Only shower is upstairs) Firefighter: Handicapped height Bathroom Accessibility: Yes Home Equipment: Cane - single point   Functional History: Prior Function Prior Level of Function : Independent/Modified Independent, History of Falls (last six months) (2 falls in the last 6 months. Mechanical in nature) Mobility Comments: Ind ADLs Comments: Ind (hx of urinary issues - had been scheduled with urology to have a procedure for a "blockage"; wears C-pap at night)   Functional Status:  Mobility: Bed Mobility Overal bed mobility: Needs Assistance Bed Mobility: Supine to Sit Supine to sit: Min assist Sit to supine: Contact guard assist General bed mobility comments: Pt able to execute with increased time, assist with bed pad to scoot hips forward to edge Transfers Overall transfer level: Needs assistance Equipment used: Quad cane Transfers: Sit to/from Stand Sit to Stand: Min assist General transfer comment: Assist to power up and steady Ambulation/Gait Ambulation/Gait assistance: Min assist Gait Distance (Feet): 75 Feet Assistive device: Quad cane Gait Pattern/deviations: Trunk flexed, Decreased stride length, Wide base of support, Step-to pattern, Shuffle General Gait Details: Verbal instruction for quad cane use, larger step lengths, upright posture. Pt with shuffling gait pattern with decreased bilateral foot clearance, tendency for downward gaze. Gait velocity: decreased   ADL: ADL Overall ADL's : Needs assistance/impaired Eating/Feeding:  Set up  Grooming: Set up, Sitting Upper Body Bathing: Minimal assistance, Sitting Lower Body Bathing: Moderate assistance, Sit to/from stand Upper Body Dressing : Minimal assistance, Sitting Lower Body Dressing: Moderate assistance, Sit to/from stand Toilet Transfer: Minimal assistance, Stand-pivot Toileting- Clothing Manipulation and Hygiene: Maximal assistance Functional mobility during ADLs: Minimal assistance General ADL Comments: very unsteady; requires external support   Cognition: Cognition Orientation Level: Oriented X4 Cognition Arousal: Alert Behavior During Therapy: WFL for tasks assessed/performed   Physical Exam: Blood pressure 136/80, pulse 79, temperature 98.7 F (37.1 C), temperature source Oral, resp. rate 20, height 5\' 7"  (1.702 m), weight 97.5 kg, SpO2 91%. Physical Exam Neurological:     Comments: Patient is alert and oriented x 3.  Mild dysarthria but fully intelligible.  Follows commands        General: No apparent distress, sitting bedside chair HEENT: Head is normocephalic, atraumatic, sclera anicteric, oral mucosa pink and moist, dentition decreased in upper teeth Neck: Supple without JVD or lymphadenopathy Heart: Reg rate and rhythm. No murmurs rubs or gallops Chest: CTA bilaterally without wheezes, rales, or rhonchi; no distress Abdomen: Soft, non-tender, non-distended, bowel sounds positive but hypoactive Extremities: No clubbing, cyanosis, or edema. Pulses are 2+ Psych: Pt's affect is a little flat Skin: Clean and intact without signs of breakdown Neuro:     Mental Status: AAO to person, place, month and day, says 2015 as year, responses a little delayed, able to say his date of birth  Speech/Languate: Naming and repetition intact,Mild dysarthria but fully intelligible, follows simple commands CRANIAL NERVES: II: PERRL. Visual fields full III, IV, VI: EOM intact, no gaze preference or deviation V: normal sensation bilaterally VII: no  asymmetry VIII: normal hearing to speech IX, X: normal palatal elevation XI: 5/5 head turn and 5/5 shoulder shrug bilaterally XII: Tongue midline     MOTOR: LUE: 4+/5 Deltoid, 4+/5 Biceps, 4+/5 Triceps,4+/5 Grip RUE: 3/5 Deltoid, 3/5 Biceps, 3/5 Triceps, 3/5 Grip   LLE: HF 4/5, KE 4+/5, ADF 4/5, APF 4/5 RLE: HF 4/5, KE 4+/5, ADF 4/5, APF 4/5   REFLEXES: 2/4 throughout, bilateral flexor plantar response, no Hoffman's, no clonus   SENSORY: Normal to touch all 4 extremities   Coordination: Normal finger to nose LUE   No hypertonia noted   MSK: R wrist ttp, wearing wrist brace       Lab Results Last 48 Hours        Results for orders placed or performed during the hospital encounter of 09/02/23 (from the past 48 hours)  Glucose, capillary     Status: Abnormal    Collection Time: 09/03/23 12:00 PM  Result Value Ref Range    Glucose-Capillary 169 (H) 70 - 99 mg/dL      Comment: Glucose reference range applies only to samples taken after fasting for at least 8 hours.    Comment 1 Notify RN      Comment 2 Document in Chart    Glucose, capillary     Status: Abnormal    Collection Time: 09/03/23  4:24 PM  Result Value Ref Range    Glucose-Capillary 141 (H) 70 - 99 mg/dL      Comment: Glucose reference range applies only to samples taken after fasting for at least 8 hours.    Comment 1 Notify RN      Comment 2 Document in Chart    Glucose, capillary     Status: Abnormal    Collection Time: 09/03/23  9:32 PM  Result Value Ref Range  Glucose-Capillary 148 (H) 70 - 99 mg/dL      Comment: Glucose reference range applies only to samples taken after fasting for at least 8 hours.    Comment 1 Notify RN    Basic metabolic panel with GFR     Status: Abnormal    Collection Time: 09/04/23  5:58 AM  Result Value Ref Range    Sodium 140 135 - 145 mmol/L    Potassium 3.6 3.5 - 5.1 mmol/L    Chloride 101 98 - 111 mmol/L    CO2 26 22 - 32 mmol/L    Glucose, Bld 141 (H) 70 - 99 mg/dL       Comment: Glucose reference range applies only to samples taken after fasting for at least 8 hours.    BUN 38 (H) 8 - 23 mg/dL    Creatinine, Ser 4.09 (H) 0.61 - 1.24 mg/dL    Calcium  9.0 8.9 - 10.3 mg/dL    GFR, Estimated 22 (L) >60 mL/min      Comment: (NOTE) Calculated using the CKD-EPI Creatinine Equation (2021)      Anion gap 13 5 - 15      Comment: Performed at [facility], [address], [place], [place] [address]  Glucose, capillary     Status: Abnormal    Collection Time: 09/04/23  6:25 AM  Result Value Ref Range    Glucose-Capillary 139 (H) 70 - 99 mg/dL      Comment: Glucose reference range applies only to samples taken after fasting for at least 8 hours.    Comment 1 Notify RN    Glucose, capillary     Status: Abnormal    Collection Time: 09/04/23 11:45 AM  Result Value Ref Range    Glucose-Capillary 201 (H) 70 - 99 mg/dL      Comment: Glucose reference range applies only to samples taken after fasting for at least 8 hours.  Glucose, capillary     Status: Abnormal    Collection Time: 09/04/23  4:34 PM  Result Value Ref Range    Glucose-Capillary 202 (H) 70 - 99 mg/dL      Comment: Glucose reference range applies only to samples taken after fasting for at least 8 hours.  Glucose, capillary     Status: Abnormal    Collection Time: 09/04/23  9:02 PM  Result Value Ref Range    Glucose-Capillary 174 (H) 70 - 99 mg/dL      Comment: Glucose reference range applies only to samples taken after fasting for at least 8 hours.    Comment 1 Notify RN      Comment 2 Document in Chart    Glucose, capillary     Status: Abnormal    Collection Time: 09/05/23  6:12 AM  Result Value Ref Range    Glucose-Capillary 170 (H) 70 - 99 mg/dL      Comment: Glucose reference range applies only to samples taken after fasting for at least 8 hours.    Comment 1 Notify RN      Comment 2 Document in Chart         Imaging Results (Last 48 hours)  DG Elbow 2 Views Right Result Date:  09/04/2023 CLINICAL DATA:  Fall.  Elbow pain. EXAM: RIGHT ELBOW - 2 VIEW COMPARISON:  None Available. FINDINGS: Mild medial elbow joint space narrowing at the trochlea-coronoid articulation. Mild chronic enthesopathic changes at the common flexor tendon origin at the medial epicondyle and common extensor tendon origin at  the lateral epicondyle. There is elevation of the distal humeral fat pad suggesting an elbow joint effusion. Mild to moderate degenerative spurring of the tip of the coronoid process. Mild to moderate chronic enthesopathic change at the triceps insertion on the olecranon. No acute fracture or dislocation. IMPRESSION: 1. Mild medial elbow osteoarthritis. 2. Mild to moderate chronic enthesopathic change at the triceps insertion on the olecranon. 3. Elevation of the distal humeral fat pad suggesting an elbow joint effusion. Electronically Signed   By: Bertina Broccoli M.D.   On: 09/04/2023 15:48    ECHOCARDIOGRAM COMPLETE Result Date: 09/03/2023    ECHOCARDIOGRAM REPORT   Patient Name:   DONYALE ARCARI Date of Exam: 09/03/2023 Medical Rec #:  [id number]     Height:       67.0 in Accession #:    [id number]    Weight:       214.9 lb Date of Birth:  07-30-1935     BSA:          2.085 m Patient Age:    88 years      BP:           131/81 mmHg Patient Gender: M             HR:           67 bpm. Exam Location:  Inpatient Procedure: 2D Echo, Color Doppler, Cardiac Doppler and Intracardiac            Opacification Agent (Both Spectral and Color Flow Doppler were            utilized during procedure). Indications:    Stroke I63.9  History:        Patient has prior history of Echocardiogram examinations, most                 recent 09/12/2021.  Sonographer:    Hersey Lorenzo RDCS Referring Phys: [phone] PROSPER M AMPONSAH IMPRESSIONS  1. Left ventricular ejection fraction, by estimation, is 25 to 30%. Left ventricular ejection fraction by 2D MOD biplane is 30.8 %. The left ventricle has severely decreased function.  The left ventricle demonstrates global hypokinesis. There is moderate  concentric left ventricular hypertrophy. Left ventricular diastolic parameters are consistent with Grade I diastolic dysfunction (impaired relaxation).  2. Right ventricular systolic function is normal. The right ventricular size is normal. Tricuspid regurgitation signal is inadequate for assessing PA pressure.  3. Left atrial size was mildly dilated.  4. The mitral valve is normal in structure. Trivial mitral valve regurgitation. No evidence of mitral stenosis.  5. The aortic valve is tricuspid. There is mild calcification of the aortic valve. Aortic valve regurgitation is mild.  6. Aortic dilatation noted. There is mild dilatation of the aortic root, measuring 40 mm. There is mild dilatation of the ascending aorta, measuring 43 mm.  7. The inferior vena cava is normal in size with greater than 50% respiratory variability, suggesting right atrial pressure of 3 mmHg. FINDINGS  Left Ventricle: Left ventricular ejection fraction, by estimation, is 25 to 30%. Left ventricular ejection fraction by 2D MOD biplane is 30.8 %. The left ventricle has severely decreased function. The left ventricle demonstrates global hypokinesis. The left ventricular internal cavity size was normal in size. There is moderate concentric left ventricular hypertrophy. Left ventricular diastolic parameters are consistent with Grade I diastolic dysfunction (impaired relaxation). Right Ventricle: The right ventricular size is normal. No increase in right ventricular wall thickness. Right ventricular systolic function is normal. Tricuspid regurgitation  signal is inadequate for assessing PA pressure. Left Atrium: Left atrial size was mildly dilated. Right Atrium: Right atrial size was normal in size. Pericardium: There is no evidence of pericardial effusion. Mitral Valve: The mitral valve is normal in structure. Trivial mitral valve regurgitation. No evidence of mitral valve  stenosis. Tricuspid Valve: The tricuspid valve is normal in structure. Tricuspid valve regurgitation is trivial. Aortic Valve: The aortic valve is tricuspid. There is mild calcification of the aortic valve. Aortic valve regurgitation is mild. Aortic regurgitation PHT measures 851 msec. Pulmonic Valve: The pulmonic valve was normal in structure. Pulmonic valve regurgitation is not visualized. Aorta: Aortic dilatation noted. There is mild dilatation of the aortic root, measuring 40 mm. There is mild dilatation of the ascending aorta, measuring 43 mm. Venous: The inferior vena cava is normal in size with greater than 50% respiratory variability, suggesting right atrial pressure of 3 mmHg. IAS/Shunts: No atrial level shunt detected by color flow Doppler.  LEFT VENTRICLE PLAX 2D                        Biplane EF (MOD) LVIDd:         5.20 cm         LV Biplane EF:   Left LVIDs:         4.50 cm                          ventricular LV PW:         1.10 cm                          ejection LV IVS:        1.10 cm                          fraction by LVOT diam:     2.60 cm                          2D MOD LV SV:         66                               biplane is LV SV Index:   32                               30.8 %. LVOT Area:     5.31 cm                                Diastology                                LV e' lateral:   3.41 cm/s LV Volumes (MOD)               LV E/e' lateral: 14.1 LV vol d, MOD    157.0 ml A2C: LV vol d, MOD    199.0 ml A4C: LV vol s, MOD    110.0 ml A2C: LV vol s, MOD    134.0 ml A4C: LV SV MOD A2C:   47.0 ml LV SV MOD  A4C:   199.0 ml LV SV MOD BP:    54.5 ml RIGHT VENTRICLE             IVC RV S prime:     11.10 cm/s  IVC diam: 1.20 cm TAPSE (M-mode): 2.2 cm LEFT ATRIUM           Index        RIGHT ATRIUM           Index LA diam:      2.30 cm 1.10 cm/m   RA Area:     15.80 cm LA Vol (A4C): 40.3 ml 19.33 ml/m  RA Volume:   40.60 ml  19.47 ml/m  AORTIC VALVE LVOT Vmax:   75.90 cm/s LVOT Vmean:   56.200 cm/s LVOT VTI:    0.124 m AI PHT:      851 msec  AORTA Ao Root diam: 4.00 cm Ao Asc diam:  4.30 cm MITRAL VALVE MV Area (PHT): 4.08 cm    SHUNTS MV Decel Time: 186 msec    Systemic VTI:  0.12 m MV E velocity: 48.00 cm/s  Systemic Diam: 2.60 cm MV A velocity: 67.40 cm/s MV E/A ratio:  0.71 Dalton McleanMD Electronically signed by Archer Bear Signature Date/Time: 09/03/2023/7:34:48 PM    Final            Blood pressure 136/80, pulse 79, temperature 98.7 F (37.1 C), temperature source Oral, resp. rate 20, height 5\' 7"  (1.702 m), weight 97.5 kg, SpO2 91%.   Medical Problem List and Plan: 1. Functional deficits secondary to bilateral cerebellar infarct, right more than left secondary large vessel disease from severe posterior circulation stenosis             -patient may shower             -ELOS/Goals: 10-12,PT/OT sup to mod I             -Admit to CIR 2.  Antithrombotics: -DVT/anticoagulation:  Pharmaceutical: Lovenox              -antiplatelet therapy: Aspirin  81 mg daily and Plavix  75 mg day x 3 months then Plavix  alone 3. Pain Management: Neurontin  300 mg twice daily 4. Mood/Behavior/Sleep: Provide emotional support             -antipsychotic agents: N/A 5. Neuropsych/cognition: This patient is capable of making decisions on his own behalf. 6. Skin/Wound Care: Routine skin checks 7. Fluids/Electrolytes/Nutrition: Routine in and outs with follow-up chemistries 8.  Klebsiella oxytoca UTI.  Completing course of Duricef 9.  CKD stage IV.  Follow-up chemistries 10.  Chronic combined systolic and diastolic congestive heart failure. EF 25-30% Monitor for any signs of fluid overload.  Follow-up cardiology services.  Continue Demadex  60 mg twice daily 11.  Nonischemic cardiomyopathy.  Followed by cardiology service Dr. Micael Adas.  Continue Imdur  30 mg daily and Coreg  6.25 mg twice daily. 12.  History of prostate cancer, BPH.  Followed by Dr. Freddi Jaeger.  Flomax  0.4 mg twice daily             -Check  PVR/bladder scan 13.  Diabetes mellitus with peripheral neuropathy and hemoglobin A1c 7.0.  Glucotrol  5 mg daily 14. HTN. Long term goal normotensive. Home meds carvedilol , imdur , toresemide 15. Obesity. Body mass index is 33.67 kg/m.             -dietary education 16. R wrist pain due to fall. Wearing wrist brace. Xray without fracture.   -tylenol  prn 17. Constipation. Miralax  daily, senokot   Daniel J Angiulli, PA-C 09/05/2023   I have personally performed a face to face diagnostic evaluation of this patient and formulated the key components of the plan.  Additionally, I have personally reviewed laboratory data, imaging studies, as well as relevant notes and concur with the physician assistant's documentation above.   The patient's status has not changed from the original H&P.  Any changes in documentation from the acute care chart have been noted above.   Lylia Sand, MD

## 2023-09-05 NOTE — TOC Transition Note (Signed)
 Transition of Care [facility]) - Discharge Note   Patient Details  Name: Evie Wiltsie MRN: [id number] Date of Birth: Oct 17, 1935  Transition of [facility]) CM/SW Contact:  Tandy Fam, LCSW Phone Number: 09/05/2023, 10:49 AM   Clinical Narrative:   CSW notified by Rehab Admissions that patient has bed available on CIR today, no further TOC  needs.    Final next level of care: IP Rehab Facility Barriers to Discharge: Barriers Resolved   Patient Goals and CMS Choice   CMS Medicare.gov Compare Post Acute Care list provided to:: Patient Choice offered to / list presented to : Patient, Adult Children      Discharge Placement                       Discharge Plan and Services Additional resources added to the After Visit Summary for     Discharge Planning Services: CM Consult Post Acute Care Choice: IP Rehab                               Social Drivers of Health (SDOH) Interventions SDOH Screenings   Food Insecurity: No Food Insecurity (09/02/2023)  Housing: Low Risk  (09/02/2023)  Transportation Needs: No Transportation Needs (09/02/2023)  Utilities: Not At Risk (09/02/2023)  Depression (PHQ2-9): Low Risk  (08/16/2023)  Social Connections: Moderately Integrated (09/02/2023)  Tobacco Use: Medium Risk (09/02/2023)  Health Literacy: Adequate Health Literacy (01/24/2023)     Readmission Risk Interventions     No data to display

## 2023-09-05 NOTE — Discharge Instructions (Addendum)
 Inpatient Rehab Discharge Instructions  Allen Dyer Discharge date and time: No discharge date for patient encounter.   Activities/Precautions/ Functional Status: Activity: As tolerated Diet: Diabetic diet Wound Care: Routine skin checks Functional status:  ___ No restrictions     ___ Walk up steps independently ___ 24/7 supervision/assistance   ___ Walk up steps with assistance ___ Intermittent supervision/assistance  ___ Bathe/dress independently ___ Walk with walker     _x__ Bathe/dress with assistance ___ Walk Independently    ___ Shower independently ___ Walk with assistance    ___ Shower with assistance ___ No alcohol     ___ Return to work/school ________  Special Instructions: No driving smoking or alcohol  Continue volar splint to right upper extremity until follow-up orthopedic service Dr. Marce Sensing [phone]  COMMUNITY REFERRALS UPON DISCHARGE:    Home Health:   PT    OT    SP    RN   AIDE                 Agency:[facility]  Phone:[phone]    Medical Equipment/Items Ordered:HOSPITAL BED, DROP-ARM BEDSIDE COMMODE AND WHEELCHAIR                                                 Agency/Supplier:[facility]  [phone]     My questions have been answered and I understand these instructions. I will adhere to these goals and the provided educational materials after my discharge from the hospital.  Patient/Caregiver Signature _______________________________ Date __________  Clinician Signature _______________________________________ Date __________  Please bring this form and your medication list with you to all your follow-up doctor's appointments. STROKE/TIA DISCHARGE INSTRUCTIONS SMOKING Cigarette smoking nearly doubles your risk of having a stroke & is the single most alterable risk factor  If you smoke or have smoked in the last 12 months, you are advised to quit smoking for your health. Most of the excess cardiovascular risk related to smoking  disappears within a year of stopping. Ask you doctor about anti-smoking medications [place] Quit Line: 1-800-QUIT NOW Free Smoking Cessation Classes [phone]  CHOLESTEROL Know your levels; limit fat & cholesterol in your diet  Lipid Panel     Component Value Date/Time   CHOL 122 09/03/2023 0715   TRIG 69 09/03/2023 0715   HDL 51 09/03/2023 0715   CHOLHDL 2.4 09/03/2023 0715   VLDL 14 09/03/2023 0715   LDLCALC 57 09/03/2023 0715     Many patients benefit from treatment even if their cholesterol is at goal. Goal: Total Cholesterol (CHOL) less than 160 Goal:  Triglycerides (TRIG) less than 150 Goal:  HDL greater than 40 Goal:  LDL (LDLCALC) less than 100   BLOOD PRESSURE American Stroke Association blood pressure target is less that 120/80 mm/Hg  Your discharge blood pressure is:  BP: (!) 146/66 Monitor your blood pressure Limit your salt and alcohol intake Many individuals will require more than one medication for high blood pressure  DIABETES (A1c is a blood sugar average for last 3 months) Goal HGBA1c is under 7% (HBGA1c is blood sugar average for last 3 months)  Diabetes:     Lab Results  Component Value Date   HGBA1C 7.0 (H) 09/03/2023    Your HGBA1c can be lowered with medications, healthy diet, and exercise. Check your blood sugar as directed by your physician Call your  physician if you experience unexplained or low blood sugars.  PHYSICAL ACTIVITY/REHABILITATION Goal is 30 minutes at least 4 days per week  Activity: Increase activity slowly, Therapies: Physical Therapy: Home Health Return to work:  Activity decreases your risk of heart attack and stroke and makes your heart stronger.  It helps control your weight and blood pressure; helps you relax and can improve your mood. Participate in a regular exercise program. Talk with your doctor about the best form of exercise for you (dancing, walking, swimming, cycling).  DIET/WEIGHT Goal is to maintain a healthy weight   Your discharge diet is:  Diet Order             Diet Carb Modified Fluid consistency: Thin; Room service appropriate? Yes with Assist  Diet effective now                   liquids Your height is:  Height: 5\' 7"  (170.2 cm) Your current weight is: Weight: 95.4 kg Your Body Mass Index (BMI) is:  BMI (Calculated): 32.93 Following the type of diet specifically designed for you will help prevent another stroke. Your goal weight range is:   Your goal Body Mass Index (BMI) is 19-24. Healthy food habits can help reduce 3 risk factors for stroke:  High cholesterol, hypertension, and excess weight.  RESOURCES Stroke/Support Group:  Call [phone]   STROKE EDUCATION PROVIDED/REVIEWED AND GIVEN TO PATIENT Stroke warning signs and symptoms How to activate emergency medical system (call 911). Medications prescribed at discharge. Need for follow-up after discharge. Personal risk factors for stroke. Pneumonia vaccine given: No Flu vaccine given: No My questions have been answered, the writing is legible, and I understand these instructions.  I will adhere to these goals & educational materials that have been provided to me after my discharge from the hospital.

## 2023-09-05 NOTE — Discharge Summary (Signed)
 Physician Discharge Summary   Patient: Allen Dyer MRN: [id number] DOB: Dec 21, 1935  Admit date:     09/02/2023  Discharge date: 09/05/23  Discharge Physician: Feliciana Horn   PCP: Lanae Pinal, MD   Recommendations at discharge:  PLEASE FOLLOW UP with PCP and Neurology as recommended.  Recommend outpatient follow up with cardiology in one week.   Discharge Diagnoses: Principal Problem:   CVA (cerebrovascular accident) [facility]) Active Problems:   Cerebellar stroke (HCC)   Urinary tract infection without hematuria   Fall at home, initial encounter   Dizziness   Vertigo of central origin    Hospital Course: 88 year old with history of chronic combined heart failure, nonischemic cardiomyopathy known ejection fraction 35%, stroke, prostate cancer status postradiation therapy and BPH, GERD, multiple myeloma, nonobstructive coronary artery disease, hypertension hyperlipidemia and type 2 diabetes, CKD stage IV presented to the ER with dizziness and fall. Dizzy and lightheadedness for about 2 and half weeks. Fell on right hand Friday. Does have significant prostatism and plan for cystoscopy next month. In the emergency room hemodynamically stable. On room air. UA was abnormal. MRI showed subacute stroke. CT angiogram showed significant vertebral artery stenosis.   Assessment and Plan:  Subacute posterior circulation stroke, symptomatic. Clinical findings, 3 weeks of dizziness and disequilibrium. MRI of the brain, patchy early subacute ischemic infarct involving both cerebellar hemisphere right more than left. CT angiogram of the head and neck, occlusion of bilateral vertebral arteries, severe stenosis of the P2 segment of the right PCA and severe stenosis of the left supraclinoid ICA. 2D echocardiogram, reviewed with the patient.  EF 25 to 30%.  Antiplatelet therapy, none at home.  Now started on aspirin  and Plavix  after loading dose plan for 3 months of DAPT Followed by plavix .  LDL 57.   Does not need treatment. Hemoglobin A1c, 7.1 Therapy recommendations, CIR. PT OT.  Neurology follow-up.   Fall with right wrist pain and swelling: X-ray negative for fracture or subluxation.  Has significant discomfort.  Will provide right wrist splint.  Ice pack.  X rays show some  effusion.   Continue full range of motion mobility. Patient reports pain is slowly improving.    CKD stage IIIb: At about baseline.     Acute UTI: Present on admission.  Cultures growing Klebsiella.  Received 2 days of Rocephin.  Will treat as complicated UTI with total 7 days of antibiotic therapy.  Will change to oral cephalosporin today.     Chronic medical issues including Hypertension, chronic combined heart failure Type 2 diabetes BPH on tamsulosin , planned procedure with urology. OSA on CPAP   Resume all home medications.    Consultants: neurology Procedures performed: ECHO.  Disposition: CIR Diet recommendation:  Discharge Diet Orders (From admission, onward)     Start     Ordered   09/05/23 0000  Diet - low sodium heart healthy        09/05/23 1253           Carb modified diet DISCHARGE MEDICATION: Allergies as of 09/05/2023       Reactions   Tramadol Other (See Comments)   Dizziness (intolerance) hallucinate   Finasteride Swelling   Breast enlargement   Jardiance  [empagliflozin ]    Yeast infection   Lisinopril Cough   Ciprofloxacin Other (See Comments)   Dizziness (intolerance)   Simvastatin Hives, Other (See Comments)   Blisters/ Rash   Sulfa Antibiotics Rash        Medication List     STOP taking  these medications    Aspirin  Low Dose 81 MG chewable tablet Generic drug: aspirin  Replaced by: aspirin  EC 81 MG tablet   eplerenone  50 MG tablet Commonly known as: INSPRA    ezetimibe  10 MG tablet Commonly known as: ZETIA    pantoprazole  40 MG tablet Commonly known as: PROTONIX        TAKE these medications    aspirin  EC 81 MG tablet Take 1 tablet (81 mg  total) by mouth daily. Swallow whole. Start taking on: Sep 06, 2023 Replaces: Aspirin  Low Dose 81 MG chewable tablet   carvedilol  12.5 MG tablet Commonly known as: COREG  Take 6.25 mg by mouth 2 (two) times daily with a meal. What changed: Another medication with the same name was removed. Continue taking this medication, and follow the directions you see here.   cefadroxil 500 MG capsule Commonly known as: DURICEF Take 1 capsule (500 mg total) by mouth 2 (two) times daily for 4 days.   clopidogrel  75 MG tablet Commonly known as: PLAVIX  Take 1 tablet (75 mg total) by mouth daily. Start taking on: Sep 06, 2023   cyanocobalamin  1000 MCG tablet Take 1,000 mcg by mouth every morning.   Fish Oil 1200 MG Caps Take 1,200 mg by mouth daily.   FREESTYLE LITE test strip Generic drug: glucose blood 1 each by Other route daily in the afternoon. Use as instructed   FreeStyle Lite w/Device Kit 1 Device by Does not apply route daily in the afternoon.   gabapentin  600 MG tablet Commonly known as: NEURONTIN  Take 300 mg by mouth 2 (two) times daily.   Garlic 1000 MG Caps Take 1,000 mg by mouth every morning.   glipiZIDE  5 MG tablet Commonly known as: GLUCOTROL  Take 5 mg by mouth daily before breakfast.   hydrALAZINE  50 MG tablet Commonly known as: APRESOLINE  Take 1 tablet (50 mg total) by mouth 3 (three) times daily. 6am, 2pm and 10pm What changed: when to take this   isosorbide  mononitrate 30 MG 24 hr tablet Commonly known as: IMDUR  Take 1 tablet (30 mg total) by mouth every morning.   meclizine  12.5 MG tablet Commonly known as: ANTIVERT  Take 2 tablets (25 mg total) by mouth 2 (two) times daily as needed for dizziness.   Oyster Shell Calcium  500 MG Tabs Take 500 mg by mouth daily.   potassium chloride  SA 20 MEQ tablet Commonly known as: KLOR-CON  M Take 2 tablets (40 mEq total) by mouth daily. What changed: how much to take   tamsulosin  0.4 MG Caps capsule Commonly known  as: FLOMAX  Take 0.4 mg by mouth 2 (two) times daily.   torsemide  20 MG tablet Commonly known as: DEMADEX  Take 3 tablets (60 mg total) by mouth 2 (two) times daily.   vitamin C 1000 MG tablet Take 4,000 mg by mouth every morning.   VITAMIN D3 PO Take 1 capsule by mouth every morning.        Follow-up Information     Penumalli, Brenton Cambridge, MD. Schedule an appointment as soon as possible for a visit in 1 month(s).   Specialties: Neurology, Radiology Contact information: [address] [place] [address] [phone]                Discharge Exam: Cleavon Curls Weights   09/02/23 1614  Weight: 97.5 kg   General exam: Appears calm and comfortable  Respiratory system: Clear to auscultation. Respiratory effort normal. Cardiovascular system: S1 & S2 heard, RRR. No JVD, murmurs, Gastrointestinal system: Abdomen is nondistended,  soft and nontender.  Central nervous system: Alert and oriented. Extremities: Symmetric 5 x 5 power. Skin: No rashes, lesions or ulcers Psychiatry:  Mood & affect appropriate.    Condition at discharge: fair  The results of significant diagnostics from this hospitalization (including imaging, microbiology, ancillary and laboratory) are listed below for reference.   Imaging Studies: DG Elbow 2 Views Right Result Date: 09/04/2023 CLINICAL DATA:  Fall.  Elbow pain. EXAM: RIGHT ELBOW - 2 VIEW COMPARISON:  None Available. FINDINGS: Mild medial elbow joint space narrowing at the trochlea-coronoid articulation. Mild chronic enthesopathic changes at the common flexor tendon origin at the medial epicondyle and common extensor tendon origin at the lateral epicondyle. There is elevation of the distal humeral fat pad suggesting an elbow joint effusion. Mild to moderate degenerative spurring of the tip of the coronoid process. Mild to moderate chronic enthesopathic change at the triceps insertion on the olecranon. No acute fracture or dislocation.  IMPRESSION: 1. Mild medial elbow osteoarthritis. 2. Mild to moderate chronic enthesopathic change at the triceps insertion on the olecranon. 3. Elevation of the distal humeral fat pad suggesting an elbow joint effusion. Electronically Signed   By: Bertina Broccoli M.D.   On: 09/04/2023 15:48   ECHOCARDIOGRAM COMPLETE Result Date: 09/03/2023    ECHOCARDIOGRAM REPORT   Patient Name:   JEANMARC GARCIAMARTINEZ Date of Exam: 09/03/2023 Medical Rec #:  [id number]     Height:       67.0 in Accession #:    [id number]    Weight:       214.9 lb Date of Birth:  [birth year]     BSA:          2.085 m Patient Age:    88 years      BP:           131/81 mmHg Patient Gender: M             HR:           67 bpm. Exam Location:  Inpatient Procedure: 2D Echo, Color Doppler, Cardiac Doppler and Intracardiac            Opacification Agent (Both Spectral and Color Flow Doppler were            utilized during procedure). Indications:    Stroke I63.9  History:        Patient has prior history of Echocardiogram examinations, most                 recent 09/12/2021.  Sonographer:    Hersey Lorenzo RDCS Referring Phys: [phone] PROSPER M AMPONSAH IMPRESSIONS  1. Left ventricular ejection fraction, by estimation, is 25 to 30%. Left ventricular ejection fraction by 2D MOD biplane is 30.8 %. The left ventricle has severely decreased function. The left ventricle demonstrates global hypokinesis. There is moderate  concentric left ventricular hypertrophy. Left ventricular diastolic parameters are consistent with Grade I diastolic dysfunction (impaired relaxation).  2. Right ventricular systolic function is normal. The right ventricular size is normal. Tricuspid regurgitation signal is inadequate for assessing PA pressure.  3. Left atrial size was mildly dilated.  4. The mitral valve is normal in structure. Trivial mitral valve regurgitation. No evidence of mitral stenosis.  5. The aortic valve is tricuspid. There is mild calcification of the aortic valve. Aortic  valve regurgitation is mild.  6. Aortic dilatation noted. There is mild dilatation of the aortic root, measuring 40 mm. There is mild dilatation of the ascending aorta, measuring 43  mm.  7. The inferior vena cava is normal in size with greater than 50% respiratory variability, suggesting right atrial pressure of 3 mmHg. FINDINGS  Left Ventricle: Left ventricular ejection fraction, by estimation, is 25 to 30%. Left ventricular ejection fraction by 2D MOD biplane is 30.8 %. The left ventricle has severely decreased function. The left ventricle demonstrates global hypokinesis. The left ventricular internal cavity size was normal in size. There is moderate concentric left ventricular hypertrophy. Left ventricular diastolic parameters are consistent with Grade I diastolic dysfunction (impaired relaxation). Right Ventricle: The right ventricular size is normal. No increase in right ventricular wall thickness. Right ventricular systolic function is normal. Tricuspid regurgitation signal is inadequate for assessing PA pressure. Left Atrium: Left atrial size was mildly dilated. Right Atrium: Right atrial size was normal in size. Pericardium: There is no evidence of pericardial effusion. Mitral Valve: The mitral valve is normal in structure. Trivial mitral valve regurgitation. No evidence of mitral valve stenosis. Tricuspid Valve: The tricuspid valve is normal in structure. Tricuspid valve regurgitation is trivial. Aortic Valve: The aortic valve is tricuspid. There is mild calcification of the aortic valve. Aortic valve regurgitation is mild. Aortic regurgitation PHT measures 851 msec. Pulmonic Valve: The pulmonic valve was normal in structure. Pulmonic valve regurgitation is not visualized. Aorta: Aortic dilatation noted. There is mild dilatation of the aortic root, measuring 40 mm. There is mild dilatation of the ascending aorta, measuring 43 mm. Venous: The inferior vena cava is normal in size with greater than 50%  respiratory variability, suggesting right atrial pressure of 3 mmHg. IAS/Shunts: No atrial level shunt detected by color flow Doppler.  LEFT VENTRICLE PLAX 2D                        Biplane EF (MOD) LVIDd:         5.20 cm         LV Biplane EF:   Left LVIDs:         4.50 cm                          ventricular LV PW:         1.10 cm                          ejection LV IVS:        1.10 cm                          fraction by LVOT diam:     2.60 cm                          2D MOD LV SV:         66                               biplane is LV SV Index:   32                               30.8 %. LVOT Area:     5.31 cm  Diastology                                LV e' lateral:   3.41 cm/s LV Volumes (MOD)               LV E/e' lateral: 14.1 LV vol d, MOD    157.0 ml A2C: LV vol d, MOD    199.0 ml A4C: LV vol s, MOD    110.0 ml A2C: LV vol s, MOD    134.0 ml A4C: LV SV MOD A2C:   47.0 ml LV SV MOD A4C:   199.0 ml LV SV MOD BP:    54.5 ml RIGHT VENTRICLE             IVC RV S prime:     11.10 cm/s  IVC diam: 1.20 cm TAPSE (M-mode): 2.2 cm LEFT ATRIUM           Index        RIGHT ATRIUM           Index LA diam:      2.30 cm 1.10 cm/m   RA Area:     15.80 cm LA Vol (A4C): 40.3 ml 19.33 ml/m  RA Volume:   40.60 ml  19.47 ml/m  AORTIC VALVE LVOT Vmax:   75.90 cm/s LVOT Vmean:  56.200 cm/s LVOT VTI:    0.124 m AI PHT:      851 msec  AORTA Ao Root diam: 4.00 cm Ao Asc diam:  4.30 cm MITRAL VALVE MV Area (PHT): 4.08 cm    SHUNTS MV Decel Time: 186 msec    Systemic VTI:  0.12 m MV E velocity: 48.00 cm/s  Systemic Diam: 2.60 cm MV A velocity: 67.40 cm/s MV E/A ratio:  0.71 Dalton McleanMD Electronically signed by Archer Bear Signature Date/Time: 09/03/2023/7:34:48 PM    Final    CT ANGIO HEAD NECK W WO CM Result Date: 09/02/2023 CLINICAL DATA:  Neuro deficit, concern for stroke/TIA. Dizziness for 2 weeks. Recent fall and hit front of head. EXAM: CT ANGIOGRAPHY HEAD AND NECK WITH AND WITHOUT  CONTRAST TECHNIQUE: Multidetector CT imaging of the head and neck was performed using the standard protocol during bolus administration of intravenous contrast. Multiplanar CT image reconstructions and MIPs were obtained to evaluate the vascular anatomy. Carotid stenosis measurements (when applicable) are obtained utilizing NASCET criteria, using the distal internal carotid diameter as the denominator. RADIATION DOSE REDUCTION: This exam was performed according to the departmental dose-optimization program which includes automated exposure control, adjustment of the mA and/or kV according to patient size and/or use of iterative reconstruction technique. CONTRAST:  60mL OMNIPAQUE  IOHEXOL  350 MG/ML SOLN COMPARISON:  Same day CT head and cervical spine. FINDINGS: CTA NECK FINDINGS Aortic arch: Standard configuration of the aortic arch. Imaged portion shows no evidence of aneurysm or dissection. No significant stenosis of the major arch vessel origins. Pulmonary arteries: As permitted by contrast timing, there are no filling defects in the visualized pulmonary arteries. Subclavian arteries: The subclavian arteries are patent bilaterally. Right carotid system: No evidence of dissection, stenosis (50% or greater), or occlusion. Tortuosity of the proximal common carotid artery. There is additional tortuosity throughout the internal carotid artery. Left carotid system: No evidence of dissection, stenosis (50% or greater), or occlusion. Minimal calcified atherosclerosis at the carotid bifurcation without stenosis. Mild tortuosity of the proximal common carotid artery. Additional tortuosity of the cervical ICA most pronounced  distally. Vertebral arteries: The right vertebral artery origin is patent with abrupt occlusion of the proximal right V1 segment. There is reconstitution of the distal V1 and proximal V2 segments which is severely diminished in caliber and appears irregular with additional occlusion of the right V2  segment near the level of C4. Irregular reconstitution of the distal V2 segment. Calcified atherosclerosis noted along the V3 and proximal V4 segments. Left vertebral artery origin is patent with diminished caliber and intraluminal contrast of the left V2 segment with occlusion at approximately the level of C4-5. The distal V2, V3, and V4 segments are occluded. Atherosclerosis along the proximal left V4 segment. Skeleton: No acute findings. Degenerative changes in the cervical spine. Disc space narrowing most pronounced at C6-7. Prominent anterior endplate osteophytes at multiple levels in the cervical spine. Edentulous maxilla. Other neck: The visualized airway is patent. No cervical lymphadenopathy. Upper chest: Visualized lung apices are clear. Review of the MIP images confirms the above findings CTA HEAD FINDINGS ANTERIOR CIRCULATION: The intracranial ICAs are patent bilaterally. Atherosclerosis of the bilateral carotid siphons resulting in moderate stenosis. There is additional severe stenosis of the left supraclinoid ICA. No proximal occlusion, aneurysm, or vascular malformation. MCAs: Patent bilaterally. Mild stenosis of the proximal M1 segment right MCA. Additional mild narrowing of a distal M2 segment of the right MCA. ACAs: The A1 segment of the left ACA is not visualized and may be stenotic secondary to atherosclerosis versus congenitally hypoplastic. Possible fenestration of the anterior communicating artery. The ACAs are patent distally. POSTERIOR CIRCULATION: Occlusion of the intracranial vertebral arteries as noted above. The basilar artery is patent but is diminished in caliber and irregular with moderate narrowing along the proximal aspect. The AICA is visualized on the left. The PICA are not well visualized. Bilateral superior cerebellar arteries noted. The PCAs are patent bilaterally but appear small in caliber. Moderate to severe narrowing of the P2 segment right PCA significantly diminished  caliber of distal right PCA branches without evidence of focal occlusion. There is additional mild irregularity of the proximal P2 segment on the left. Severe stenosis of the posterior P2 segment of the left PCA. Diminished caliber of distal left PCA branches without focal occlusion. Venous sinuses: As permitted by contrast timing, patent. Anatomic variants: None Review of the MIP images confirms the above findings IMPRESSION: Occlusion of the bilateral vertebral arteries as above. Irregular reconstitution of the distal V2 segment of the right vertebral artery with occlusion of the right V3 and V4 segments. The left vertebral artery is occluded at the V2 segment from the level of C4-5. Additional irregularity and moderate narrowing of the proximal basilar artery. The basilar artery is irregular throughout its course without occlusion. Diminutive caliber of the bilateral posterior cerebral arteries. Moderate to severe stenosis of the P2 segment right PCA. Additional severe stenosis of the posterior P2 segment of the left PCA. Severely diminished caliber of distal PCA branches bilaterally without evidence of focal occlusion. Atherosclerosis of the carotid siphons. Focal severe stenosis of the left supraclinoid ICA. Additional moderate stenosis of the bilateral cavernous ICAs. Tortuosity of the common carotid arteries and cervical internal carotid arteries which may be secondary to chronic hypertension. Electronically Signed   By: Denny Flack M.D.   On: 09/02/2023 20:00   DG Wrist Complete Right Result Date: 09/02/2023 CLINICAL DATA:  Pain after fall.  Swelling. EXAM: RIGHT WRIST - COMPLETE 3+ VIEW COMPARISON:  None Available. FINDINGS: There is no evidence of fracture or dislocation. Scattered osteoarthritis. Mild chondrocalcinosis.  No erosive or bony destructive change. There is generalized subcutaneous edema. IMPRESSION: 1. Generalized subcutaneous edema. No fracture or subluxation of the right wrist. 2.  Scattered osteoarthritis. Electronically Signed   By: Chadwick Colonel M.D.   On: 09/02/2023 18:25   MR BRAIN WO CONTRAST Result Date: 09/02/2023 CLINICAL DATA:  Initial evaluation for acute dizziness and vertigo. EXAM: MRI HEAD WITHOUT CONTRAST TECHNIQUE: Multiplanar, multiecho pulse sequences of the brain and surrounding structures were obtained without intravenous contrast. COMPARISON:  CT from 08/25/2023. FINDINGS: Brain: Diffuse prominence of the CSF containing spaces compatible with generalized cerebral atrophy, moderately advanced in nature. Patchy T2/FLAIR hyperintensity involving the supratentorial cerebral white matter, consistent with chronic small vessel ischemic disease, relatively mild for age. Patchy restricted diffusion seen involving the right greater than left cerebellar hemispheres (series 6, image 6). Associated T2/FLAIR signal abnormality with mild signal loss on ADC map, consistent with early subacute ischemic infarcts. Associated minimal petechial blood products at the right cerebellum without hemorrhagic transformation. No significant regional mass effect. These changes are superimposed on a few scattered underlying chronic cerebellar infarcts. No mass lesion, midline shift or mass effect. No hydrocephalus or extra-axial fluid collection. Pituitary gland within normal limits. Vascular: Irregular flow voids within the V4 segments bilaterally, which could reflect slow flow and/or occlusion. Anterior circulation flow voids maintained. Skull and upper cervical spine: Degenerative osteoarthritic changes about the C1-2 articulations without significant stenosis. Bone marrow signal intensity within normal limits. No scalp soft tissue abnormality. Sinuses/Orbits: Prior bilateral ocular lens replacement. Paranasal sinuses are clear. No mastoid effusion. Other: None. IMPRESSION: 1. Patchy early subacute ischemic infarcts involving the right greater than left cerebellar hemispheres. Associated  minimal petechial blood products at the right cerebellum without hemorrhagic transformation or significant regional mass effect. 2. Irregular flow voids within the V4 segments bilaterally, which could reflect slow flow and/or occlusion. Correlation with dedicated vascular imaging recommended. 3. Underlying moderately advanced cerebral atrophy with mild chronic small vessel ischemic disease. Electronically Signed   By: Virgia Griffins M.D.   On: 09/02/2023 18:12   CT Head Wo Contrast Result Date: 09/02/2023 CLINICAL DATA:  Fall 2 days ago, dizziness EXAM: CT HEAD WITHOUT CONTRAST CT CERVICAL SPINE WITHOUT CONTRAST TECHNIQUE: Multidetector CT imaging of the head and cervical spine was performed following the standard protocol without intravenous contrast. Multiplanar CT image reconstructions of the cervical spine were also generated. RADIATION DOSE REDUCTION: This exam was performed according to the departmental dose-optimization program which includes automated exposure control, adjustment of the mA and/or kV according to patient size and/or use of iterative reconstruction technique. COMPARISON:  CT brain, 08/25/2023, same-day MR brain FINDINGS: CT HEAD FINDINGS Brain: Hypodense lesions in the cerebellar hemispheres, measuring 3.4 x 3.2 cm on the right (series 3, image 7) and 0.8 cm and 1.8 x 1.2 cm on the left (series 3, image 8). Vascular: No hyperdense vessel or unexpected calcification. Skull: Normal. Negative for fracture or focal lesion. Sinuses/Orbits: No acute finding. Other: None. CT CERVICAL SPINE FINDINGS Alignment: Straightening of the normal cervical lordosis. Skull base and vertebrae: No acute fracture. No primary bone lesion or focal pathologic process. Soft tissues and spinal canal: No prevertebral fluid or swelling. No visible canal hematoma. Disc levels: Mild-to-moderate multilevel cervical disc degenerative disease throughout the cervical spine. Upper chest: Negative. Other: None.  IMPRESSION: 1. Hypodense lesions in the cerebellar hemispheres, measuring 3.4 x 3.2 cm on the right and 0.8 cm and 1.8 x 1.2 cm on the left. Please see forthcoming MR for further  characterization. 2. No acute fracture or static subluxation of the cervical spine. 3. Mild-to-moderate multilevel cervical disc degenerative disease throughout the cervical spine. Electronically Signed   By: Fredricka Jenny M.D.   On: 09/02/2023 17:16   CT Cervical Spine Wo Contrast Result Date: 09/02/2023 CLINICAL DATA:  Fall 2 days ago, dizziness EXAM: CT HEAD WITHOUT CONTRAST CT CERVICAL SPINE WITHOUT CONTRAST TECHNIQUE: Multidetector CT imaging of the head and cervical spine was performed following the standard protocol without intravenous contrast. Multiplanar CT image reconstructions of the cervical spine were also generated. RADIATION DOSE REDUCTION: This exam was performed according to the departmental dose-optimization program which includes automated exposure control, adjustment of the mA and/or kV according to patient size and/or use of iterative reconstruction technique. COMPARISON:  CT brain, 08/25/2023, same-day MR brain FINDINGS: CT HEAD FINDINGS Brain: Hypodense lesions in the cerebellar hemispheres, measuring 3.4 x 3.2 cm on the right (series 3, image 7) and 0.8 cm and 1.8 x 1.2 cm on the left (series 3, image 8). Vascular: No hyperdense vessel or unexpected calcification. Skull: Normal. Negative for fracture or focal lesion. Sinuses/Orbits: No acute finding. Other: None. CT CERVICAL SPINE FINDINGS Alignment: Straightening of the normal cervical lordosis. Skull base and vertebrae: No acute fracture. No primary bone lesion or focal pathologic process. Soft tissues and spinal canal: No prevertebral fluid or swelling. No visible canal hematoma. Disc levels: Mild-to-moderate multilevel cervical disc degenerative disease throughout the cervical spine. Upper chest: Negative. Other: None. IMPRESSION: 1. Hypodense lesions in the  cerebellar hemispheres, measuring 3.4 x 3.2 cm on the right and 0.8 cm and 1.8 x 1.2 cm on the left. Please see forthcoming MR for further characterization. 2. No acute fracture or static subluxation of the cervical spine. 3. Mild-to-moderate multilevel cervical disc degenerative disease throughout the cervical spine. Electronically Signed   By: Fredricka Jenny M.D.   On: 09/02/2023 17:16   CT Head Wo Contrast Result Date: 08/25/2023 CLINICAL DATA:  Headache, neuro deficit EXAM: CT HEAD WITHOUT CONTRAST TECHNIQUE: Contiguous axial images were obtained from the base of the skull through the vertex without intravenous contrast. RADIATION DOSE REDUCTION: This exam was performed according to the departmental dose-optimization program which includes automated exposure control, adjustment of the mA and/or kV according to patient size and/or use of iterative reconstruction technique. COMPARISON:  MRI 06/06/2022 FINDINGS: Brain: Normal anatomic configuration. Parenchymal volume loss is commensurate with the patient's age. Mild periventricular white matter changes are present likely reflecting the sequela of small vessel ischemia. Multiple lacunar infarcts are again identified within the cerebellar hemispheres bilaterally. No abnormal intra or extra-axial mass lesion or fluid collection. No abnormal mass effect or midline shift. No evidence of acute intracranial hemorrhage or infarct. Ventricular size is normal. Cerebellum unremarkable. Vascular: No asymmetric hyperdense vasculature at the skull base. Skull: Intact Sinuses/Orbits: Paranasal sinuses are clear. Orbits are unremarkable. Other: Mastoid air cells and middle ear cavities are clear. IMPRESSION: 1. No evidence of acute intracranial hemorrhage or infarct. 2. Mild periventricular white matter changes likely reflecting the sequela of small vessel ischemia. 3. Multiple lacunar infarcts again identified within the cerebellar hemispheres bilaterally. Electronically Signed    By: Worthy Heads M.D.   On: 08/25/2023 12:37    Microbiology: Results for orders placed or performed during the hospital encounter of 09/02/23  Urine Culture     Status: Abnormal   Collection Time: 09/02/23  4:57 PM   Specimen: Urine, Clean Catch  Result Value Ref Range Status   Specimen Description  Final    URINE, CLEAN CATCH Performed at [facility], [address] [place], [place] [address]    Special Requests   Final    NONE Performed at [facility], [address], [place], [place] [address]    Culture >=100,000 COLONIES/mL KLEBSIELLA OXYTOCA (A)  Final   Report Status 09/04/2023 FINAL  Final   Organism ID, Bacteria KLEBSIELLA OXYTOCA (A)  Final      Susceptibility   Klebsiella oxytoca - MIC*    AMPICILLIN >=32 RESISTANT Resistant     CEFEPIME <=0.12 SENSITIVE Sensitive     CEFTRIAXONE <=0.25 SENSITIVE Sensitive     CIPROFLOXACIN <=0.25 SENSITIVE Sensitive     GENTAMICIN <=1 SENSITIVE Sensitive     IMIPENEM <=0.25 SENSITIVE Sensitive     NITROFURANTOIN <=16 SENSITIVE Sensitive     TRIMETH/SULFA <=20 SENSITIVE Sensitive     AMPICILLIN/SULBACTAM 8 SENSITIVE Sensitive     PIP/TAZO <=4 SENSITIVE Sensitive ug/mL    * >=100,000 COLONIES/mL KLEBSIELLA OXYTOCA    Labs: CBC: Recent Labs  Lab 09/02/23 1642 09/03/23 0715  WBC 7.9 7.0  NEUTROABS 6.0  --   HGB 12.4* 12.0*  HCT 38.3* 36.4*  MCV 101.6* 99.7  PLT 153 159   Basic Metabolic Panel: Recent Labs  Lab 09/02/23 1642 09/03/23 0715 09/04/23 0558  NA 138 142 140  K 3.6 3.3* 3.6  CL 98 102 101  CO2 30 28 26   GLUCOSE 171* 142* 141*  BUN 38* 35* 38*  CREATININE 2.28* 2.28* 2.71*  CALCIUM  9.7 9.6 9.0   Liver Function Tests: Recent Labs  Lab 09/02/23 1642  AST 18  ALT 12  ALKPHOS 51  BILITOT 1.1  PROT 7.6  ALBUMIN  3.6   CBG: Recent Labs  Lab 09/04/23 1145 09/04/23 1634 09/04/23 2102 09/05/23 0612 09/05/23 1127  GLUCAP 201* 202* 174* 170* 153*     Discharge time spent: 38 minutes.   Signed: Feliciana Horn, MD [facility] 09/05/2023

## 2023-09-05 NOTE — Progress Notes (Signed)
 Lylia Sand, MD  Physician Physical Medicine and Rehabilitation   PMR Pre-admission    Signed   Date of Service: 09/05/2023 11:24 AM  Related encounter: ED to Hosp-Admission (Current) from 09/02/2023 in [place] [facility]   Signed     Expand All Collapse All  PMR Admission Coordinator Pre-Admission Assessment   Patient: Dov Basciano is an 88 y.o., male MRN: [id number] DOB: [birth year] Height: 5\' 7"  (170.2 cm) Weight: 97.5 kg   Insurance Information HMO:     PPO:      PCP:      IPA:      80/20:      OTHER:  PRIMARY: Medicare Part A and B      Policy#: [id number]       Subscriber: pt CM Name:       Phone#:      Fax#:  Pre-Cert#: verified Health and safety inspector:  Benefits:  Phone #:      Name:  Eff. Date: 05/25/02 A, 06/23/02 B     Deduct: $1676      Out of Pocket Max: n/a      Life Max: n/a CIR: 100%      SNF: 20 full days Outpatient: 80%     Co-Pay: 20% Home Health: 100%      Co-Pay:  DME: 80%     Co-Pay: 20% Providers:  SECONDARY: Tricare for Life      Policy#: [id number]      Phone#: [phone]   Financial Counselor:       Phone#:    The "Data Collection Information Summary" for patients in Inpatient Rehabilitation Facilities with attached "Privacy Act Statement-Health Care Records" was provided and verbally reviewed with: Patient   Emergency Contact Information Contact Information       Name Relation Home Work Mobile    campbell,veronica Daughter [phone]   [phone]         Other Contacts   None on File        Current Medical History  Patient Admitting Diagnosis: CVA    History of Present Illness: Pt is an 88 y/o male with PMH of HTN, HLD, NICM, mulitple myeloma, CAD, CKD stage IV, and DM who was admitted to [facility] on 09/02/23 with complaints of dizziness x2.5 weeks and a fall.  He initially presented to ED on 5/3 for the same and workup was unremarkable.  His symptoms did not improve on meclizine  so he returned.  In ED vitals  normal, labs unremarkable, UA showed moderate hemoglobinuria, mild proteinuria, positive for nitrate and large leukocytes.  EKG with prolonged PR interval, LVH, RBBB, and LAFB.  Brain imaging revealed acute to early subacute ischemic infarcts in the R>L cerebellar hemispheres.  Neurology consulted.  CTA showed occlusion of bilateral vertebral arteries, narrowing of the proximal basilar artery, and moderate to severe stenosis of the P2 segment of the right PCA and L PCA.  Echo with reduced EF 25-30%.  A1C 7.1.  Recommendations for aspirin  and plavix , and then plavix  alone.  Therapy ongoing and pt was recommended for CIR.    Complete NIHSS TOTAL: 0   [facility]'s medical record from [facility] has been reviewed by the rehabilitation admission coordinator and physician.   Past Medical History      Past Medical History:  Diagnosis Date   Chronic combined systolic and diastolic CHF (congestive heart failure) (HCC)     CKD (chronic kidney disease), stage III (HCC)  Hyperlipidemia     Hypertension     Lower extremity edema     Mild CAD      a. mild-mod by cath 05/2018.   Multiple myeloma (HCC) 11/15/2018   NICM (nonischemic cardiomyopathy) (HCC)     Peripheral neuropathy     Prostate cancer (HCC)      Status post XRT   Rheumatic fever     Stroke [facility])     Trifascicular block     Type 2 diabetes mellitus (HCC)     Vertigo            Has the patient had major surgery during 100 days prior to admission? No   Family History   family history includes Cancer in his mother and sister; Congestive Heart Failure in his brother and father.   Current Medications  Current Medications    Current Facility-Administered Medications:    acetaminophen  (TYLENOL ) tablet 650 mg, 650 mg, Oral, Q4H PRN, 650 mg at 09/02/23 2233 **OR** acetaminophen  (TYLENOL ) 160 MG/5ML solution 650 mg, 650 mg, Per Tube, Q4H PRN **OR** acetaminophen  (TYLENOL ) suppository 650 mg, 650 mg, Rectal, Q4H PRN, Vita Grip,  MD   aspirin  EC tablet 81 mg, 81 mg, Oral, Daily, Consuelo Denmark, MD, 81 mg at 09/05/23 0932   carvedilol  (COREG ) tablet 6.25 mg, 6.25 mg, Oral, BID WC, Ghimire, Kuber, MD, 6.25 mg at 09/05/23 4098   cefadroxil (DURICEF) capsule 500 mg, 500 mg, Oral, BID, Ghimire, Kuber, MD, 500 mg at 09/05/23 1191   cholecalciferol (VITAMIN D3) 25 MCG (1000 UNIT) tablet 1,000 Units, 1,000 Units, Oral, q AM, Vita Grip, MD, 1,000 Units at 09/05/23 0640   clopidogrel  (PLAVIX ) tablet 75 mg, 75 mg, Oral, Daily, Augustin Leber, MD, 75 mg at 09/05/23 4782   cyanocobalamin  (VITAMIN B12) tablet 1,000 mcg, 1,000 mcg, Oral, q morning, Amponsah, Prosper M, MD, 1,000 mcg at 09/05/23 9562   enoxaparin  (LOVENOX ) injection 30 mg, 30 mg, Subcutaneous, Q24H, Vita Grip, MD, 30 mg at 09/04/23 2209   gabapentin  (NEURONTIN ) capsule 300 mg, 300 mg, Oral, BID, Ghimire, Kuber, MD, 300 mg at 09/05/23 1308   glipiZIDE  (GLUCOTROL ) tablet 5 mg, 5 mg, Oral, Daily, Ghimire, Kuber, MD, 5 mg at 09/05/23 6578   insulin  aspart (novoLOG ) injection 0-15 Units, 0-15 Units, Subcutaneous, TID WC, Amponsah, Prosper M, MD, 3 Units at 09/05/23 4696   insulin  aspart (novoLOG ) injection 0-5 Units, 0-5 Units, Subcutaneous, QHS, Amponsah, Annette Killings, MD   isosorbide  mononitrate (IMDUR ) 24 hr tablet 30 mg, 30 mg, Oral, q morning, Ghimire, Kuber, MD, 30 mg at 09/05/23 0932   meclizine  (ANTIVERT ) tablet 25 mg, 25 mg, Oral, BID PRN, Ghimire, Kuber, MD   potassium chloride  SA (KLOR-CON  M) CR tablet 40 mEq, 40 mEq, Oral, Daily, Vita Grip, MD, 40 mEq at 09/05/23 0932   senna-docusate (Senokot-S) tablet 1 tablet, 1 tablet, Oral, QHS PRN, Vita Grip, MD   tamsulosin  (FLOMAX ) capsule 0.4 mg, 0.4 mg, Oral, BID, Vita Grip, MD, 0.4 mg at 09/05/23 0932   torsemide  (DEMADEX ) tablet 60 mg, 60 mg, Oral, BID, Vita Grip, MD, 60 mg at 09/05/23 0932     Patients Current Diet:  Diet Order                  Diet  Carb Modified Fluid consistency: Thin; Room service appropriate? Yes with Assist  Diet effective now  Precautions / Restrictions Precautions Precautions: Fall Precaution/Restrictions Comments: Urinary incontinence Restrictions Weight Bearing Restrictions Per Provider Order: No Other Position/Activity Restrictions: as tolerates    Has the patient had 2 or more falls or a fall with injury in the past year? Yes   Prior Activity Level Community (5-7x/wk): independent prior to admit, no DME used, driving   Prior Functional Level Self Care: Did the patient need help bathing, dressing, using the toilet or eating? Independent   Indoor Mobility: Did the patient need assistance with walking from room to room (with or without device)? Independent   Stairs: Did the patient need assistance with internal or external stairs (with or without device)? Independent   Functional Cognition: Did the patient need help planning regular tasks such as shopping or remembering to take medications? Independent   Patient Information Are you of Hispanic, Latino/a,or Spanish origin?: A. No, not of Hispanic, Latino/a, or Spanish origin What is your race?: B. Black or African American Do you need or want an interpreter to communicate with a doctor or health care staff?: 0. No   Patient's Response To:  Health Literacy and Transportation Is the patient able to respond to health literacy and transportation needs?: Yes Health Literacy - How often do you need to have someone help you when you read instructions, pamphlets, or other written material from your doctor or pharmacy?: Never In the past 12 months, has lack of transportation kept you from medical appointments or from getting medications?: No In the past 12 months, has lack of transportation kept you from meetings, work, or from getting things needed for daily living?: No   Home Assistive Devices / Equipment Home Equipment: Jeananne Mighty -  single point   Prior Device Use: Indicate devices/aids used by the patient prior to current illness, exacerbation or injury? None of the above   Current Functional Level Cognition   Orientation Level: Oriented X4    Extremity Assessment (includes Sensation/Coordination)   Upper Extremity Assessment: Right hand dominant, RUE deficits/detail RUE Deficits / Details: R wrist pain from fall; difficulty assessing movement due to wrist pain which is affecting his ability to move his elbow and hand; unable to use R hand functionally; gross grasp lacking @ 1 in from palmer crease; unable to tolerate any wrist movement; daughter reports uncoordinated movement prior to fall RUE: Unable to fully assess due to pain RUE Coordination: decreased fine motor  Lower Extremity Assessment: Defer to PT evaluation     ADLs   Overall ADL's : Needs assistance/impaired Eating/Feeding: Set up Grooming: Set up, Sitting Upper Body Bathing: Minimal assistance, Sitting Lower Body Bathing: Moderate assistance, Sit to/from stand Upper Body Dressing : Minimal assistance, Sitting Lower Body Dressing: Moderate assistance, Sit to/from stand Toilet Transfer: Minimal assistance, Stand-pivot Toileting- Clothing Manipulation and Hygiene: Maximal assistance Functional mobility during ADLs: Minimal assistance General ADL Comments: very unsteady; requires external support     Mobility   Overal bed mobility: Needs Assistance Bed Mobility: Supine to Sit Supine to sit: Min assist Sit to supine: Contact guard assist General bed mobility comments: Pt able to execute with increased time, assist with bed pad to scoot hips forward to edge     Transfers   Overall transfer level: Needs assistance Equipment used: Quad cane Transfers: Sit to/from Stand Sit to Stand: Min assist General transfer comment: Assist to power up and steady     Ambulation / Gait / Stairs / Psychologist, prison and probation services   Ambulation/Gait Ambulation/Gait  assistance: Editor, commissioning (Feet): 75  Feet Assistive device: Quad cane Gait Pattern/deviations: Trunk flexed, Decreased stride length, Wide base of support, Step-to pattern, Shuffle General Gait Details: Verbal instruction for quad cane use, larger step lengths, upright posture. Pt with shuffling gait pattern with decreased bilateral foot clearance, tendency for downward gaze. Gait velocity: decreased     Posture / Balance Dynamic Sitting Balance Sitting balance - Comments: EOB Balance Overall balance assessment: Needs assistance, History of Falls Sitting-balance support: Feet supported, Single extremity supported Sitting balance-Leahy Scale: Good Sitting balance - Comments: EOB Standing balance support: Single extremity supported, During functional activity Standing balance-Leahy Scale: Poor Standing balance comment: Reliant on external support.     Special needs/care consideration Diabetic management A1C 7.1    Previous Home Environment (from acute therapy documentation) Living Arrangements: Children Available Help at Discharge: Family, Available 24 hours/day Type of Home: House Home Layout: Two level, Able to live on main level with bedroom/bathroom, Bed/bath upstairs Alternate Level Stairs-Rails: Left Alternate Level Stairs-Number of Steps: 10 (very steep per daughter) Home Access: Stairs to enter Entrance Stairs-Rails: Can reach both Entrance Stairs-Number of Steps: 5 Bathroom Shower/Tub: Tub/shower unit (Only shower is upstairs) Firefighter: Handicapped height Bathroom Accessibility: Yes How Accessible: Accessible via walker Home Care Services: No   Discharge Living Setting Plans for Discharge Living Setting: Patient's home, Lives with (comment) (daughter, [name]) Type of Home at Discharge: House Discharge Home Layout: 1/2 bath on main level, Other (Comment) (shower upstairs) Discharge Home Access: Stairs to enter Entrance Stairs-Rails: Can reach  both Entrance Stairs-Number of Steps: 5 Discharge Bathroom Shower/Tub: Tub/shower unit (upstairs) Discharge Bathroom Toilet: Handicapped height Discharge Bathroom Accessibility: Yes How Accessible: Accessible via walker Does the patient have any problems obtaining your medications?: No   Social/Family/Support Systems Anticipated Caregiver: daughter, [name] Anticipated Caregiver's Contact Information: [phone] Ability/Limitations of Caregiver: none stated Caregiver Availability: 24/7 Discharge Plan Discussed with Primary Caregiver: Yes Is Caregiver In Agreement with Plan?: Yes Does Caregiver/Family have Issues with Lodging/Transportation while Pt is in Rehab?: No   Goals Patient/Family Goal for Rehab: PT/OT supervision to mod I, SLP n/a Expected length of stay: 10-12 days Additional Information: Discharge plan: return to pt's home with daughter who can provide 24/7 supervision Pt/Family Agrees to Admission and willing to participate: Yes Program Orientation Provided & Reviewed with Pt/Caregiver Including Roles  & Responsibilities: Yes   Decrease burden of Care through IP rehab admission: n/a   Possible need for SNF placement upon discharge:  Not anticipated.  Plan for d/c home with pt's daughter able to provide 24/7 supervision if needed.    Patient Condition: I have reviewed medical records from [facility], spoken with [facility] team, and patient. I met with patient at the bedside for inpatient rehabilitation assessment.  Patient will benefit from ongoing PT and OT, can actively participate in 3 hours of therapy a day 5 days of the week, and can make measurable gains during the admission.  Patient will also benefit from the coordinated team approach during an Inpatient Acute Rehabilitation admission.  The patient will receive intensive therapy as well as Rehabilitation physician, nursing, social worker, and care management interventions.  Due to safety, disease management, medication  administration, and patient education the patient requires 24 hour a day rehabilitation nursing.  The patient is currently min to mod assist with mobility and ADLs with mobility and basic ADLs.  Discharge setting and therapy post discharge at home with home health is anticipated.  Patient has agreed to participate in the Acute Inpatient Rehabilitation Program and  will admit today.   Preadmission Screen Completed By:  Mickey Alar, PT, DPT 09/05/2023 11:25 AM ______________________________________________________________________   Discussed status with Dr. Rayleen Cal on 09/05/23  at 11:25 AM  and received approval for admission today.   Admission Coordinator:  Kamarii Carton E Alizabeth Antonio, PT, DPT time 11:25 AM Alanna Hu 09/05/23     Assessment/Plan: Diagnosis:  bilateral cerebellar infarct, right more than left secondary large vessel disease from severe posterior circulation stenosis  Does the need for close, 24 hr/day Medical supervision in concert with the patient's rehab needs make it unreasonable for this patient to be served in a less intensive setting? Yes Co-Morbidities requiring supervision/potential complications: UTI, CKD, Cardiomyopathy, Postate ca hx, DM Due to bladder management, bowel management, safety, skin/wound care, disease management, medication administration, pain management, and patient education, does the patient require 24 hr/day rehab nursing? Yes Does the patient require coordinated care of a physician, rehab nurse, PT, OT, and SLP to address physical and functional deficits in the context of the above medical diagnosis(es)? Yes Addressing deficits in the following areas: balance, endurance, locomotion, strength, transferring, bowel/bladder control, bathing, dressing, feeding, grooming, toileting, and psychosocial support Can the patient actively participate in an intensive therapy program of at least 3 hrs of therapy 5 days a week? Yes The potential for patient to make measurable  gains while on inpatient rehab is excellent Anticipated functional outcomes upon discharge from inpatient rehab: modified independent PT, modified independent OT, n/a SLP Estimated rehab length of stay to reach the above functional goals is: 10-12 Anticipated discharge destination: Home 10. Overall Rehab/Functional Prognosis: excellent     MD Signature: Lylia Sand          Revision History

## 2023-09-05 NOTE — Discharge Summary (Signed)
 Physician Discharge Summary  Patient ID: Allen Dyer MRN: [id number] DOB/AGE: 09-29-1935 88 y.o.  Admit date: 09/05/2023 Discharge date: 09/27/2023 beyond the [place]  Discharge Diagnoses:  Principal Problem:   Ischemic cerebrovascular accident (CVA) (HCC) Active Problems:   Unspecified symptoms and signs involving cognitive functions following cerebral infarction DVT prophylaxis Klebsiella oxytoca UTI CKD stage IV Chronic combined systolic and diastolic congestive heart failure Nonischemic cardiomyopathy History of prostate cancer/BPH/urinary retention Diabetes mellitus peripheral neuropathy Hypertension Obesity with BMI 33.67 Right wrist pain due to fall Constipation Gout Multiple myeloma GERD Age-indeterminate dorsal triquetral fracture  Discharged Condition: Stable  Significant Diagnostic Studies: CT HEAD WO CONTRAST ( ) Result Date: 09/19/2023 CLINICAL DATA:  88 year old male with vision loss. Recent cerebellar infarcts with abnormal signal in the cerebellar peduncles. EXAM: CT HEAD WITHOUT CONTRAST TECHNIQUE: Contiguous axial images were obtained from the base of the skull through the vertex without intravenous contrast. RADIATION DOSE REDUCTION: This exam was performed according to the departmental dose-optimization program which includes automated exposure control, adjustment of the mA and/or kV according to patient size and/or use of iterative reconstruction technique. COMPARISON:  Head CT 09/17/2023 and earlier. FINDINGS: Brain: Hypodense scattered bilateral cerebellar infarcts, and confluent hypodensity again noted in the bilateral cerebellar peduncles. No hemorrhagic transformation. No posterior fossa mass effect. Stable CT appearance from 2 days ago. No superimposed midline shift, ventriculomegaly, mass effect, evidence of mass lesion, intracranial hemorrhage or new cortically based infarct. Bilateral PCA territory and optic radiation region gray-white differentiation  appears stable and normal except for subtle chronic right occipital pole encephalomalacia which is more apparent on MRI. Vascular: Extensive Calcified atherosclerosis at the skull base. No suspicious intracranial vascular hyperdensity. Skull: Intact, stable. Sinuses/Orbits: Visualized paranasal sinuses and mastoids are clear. Other: Stable postoperative appearance of the globes. No acute orbit or scalp soft tissue finding identified. IMPRESSION: 1. Stable CT appearance of bilateral cerebellar infarcts and abnormal middle cerebellar peduncles as seen on MRI. No hemorrhagic transformation or mass effect. 2. No new intracranial abnormality. Small area of chronic right occipital pole encephalomalacia better demonstrated by MRI. Electronically Signed   By: Marlise Simpers M.D.   On: 09/19/2023 11:48   CT HEAD WO CONTRAST ( ) Result Date: 09/17/2023 CLINICAL DATA:  Stroke follow-up EXAM: CT HEAD WITHOUT CONTRAST TECHNIQUE: Contiguous axial images were obtained from the base of the skull through the vertex without intravenous contrast. RADIATION DOSE REDUCTION: This exam was performed according to the departmental dose-optimization program which includes automated exposure control, adjustment of the mA and/or kV according to patient size and/or use of iterative reconstruction technique. COMPARISON:  09/11/2023 FINDINGS: Brain: Unchanged hypoattenuation within the middle cerebellar peduncles. 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 No mass, hemorrhage or extra-axial collection. Vascular: Atherosclerotic calcification of the internal carotid arteries at the skull base. No abnormal hyperdensity of the major intracranial arteries or dural venous sinuses. Skull: The visualized skull base, calvarium and extracranial soft tissues are normal. Sinuses/Orbits: No fluid levels or advanced mucosal thickening of the visualized paranasal sinuses. No mastoid or middle ear effusion. Normal orbits. Other: None. IMPRESSION: 1. Unchanged  hypoattenuation within the middle cerebellar peduncles, corresponding to areas of diffusion restriction previously seen. 2. 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 Electronically Signed   By: Juanetta Nordmann M.D.   On: 09/17/2023 20:00   DG Swallowing Func-Speech Pathology Result Date: 09/12/2023 Table formatting from the original result was not included. Modified Barium Swallow Study Patient Details Name: Allen Dyer MRN: [id number] Date of Birth: [birth year] Today's Date: 09/12/2023 HPI/PMH:  HPI: Allen Dyer is an 88 year old right-handed male with history significant for chronic combined systolic and diastolic heart failure, nonischemic cardiomyopathy, CVA, prostate cancer status post XRT/BPH, GERD, gout, multiple myeloma, nonobstructive CAD, hyperlipidemia, hypertension, diabetes mellitus with peripheral neuropathy and CKD stage IV. Presented 09/02/2023 with progressive unsteady gait times several weeks as well as falls x 2.  MRI showed patchy early subacute ischemic infarcts involving the right greater than left cerebellar hemispheres.  Associated minimal petechial blood products of the right cerebellum without hemorrhagic transformation. 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  Irregular reconstitution of the distal V2 segment of the right vertebral artery with occlusion of the right V3 and V4 segments.  Left vertebral artery occluded at the V2 segment from the level of C4-5.  CT cervical spine showed hypodense lesions in the cerebellar hemispheres, measuring 3.4 x 3.2 cm on the right 0.8 cm and 1.8 x 1.1 cm on the left. Was tolerating a regular consistency diet until 09/07/23 when patient developed significantly worsening motor speech and began coughing with thin liquids. Clinical Impression: Clinical Impression: Patient presents with mild oropharyngeal dysphagia primarily characterized by reductions to pharyngeal efficiency and safety d/t mistiming of the swallow with thin liquids. Orally,  patient demonstrates weakness/numbness in the right cheek and lips resulting in anterior spillage with all liquids administered, escaping to mid chin. Pharyngeally, patient demosntrated reduction in base of tongue retraction to the posterior pharyngeal wall and reduced pharyngeal stripping wave resulting in residue in the vallecula and pyriform sinuses which mostly cleared with subsequent swallows. Once instance of aspiration was observed with consecutive cup sips of thin liquids, however suspect this is happening intermittently throughout the day given cough response after consecutive sips at bedside throughout evaluation and reports from daughter/nursing. Aspiration was secondary to mistiming and inefficiency as bolus material Dyer to the pyriform sinuses before the swallow, and material from the initial swallow was not fully cleared prior to next swallow resulting in overflow and resultant spillage into the trachea. Cough response was activated but was not effective. Notably at bedside, patient has been noted to exhibit intermittent instances of oral holding and residue though not observed on today's study, thus recommend Dys3/thin liquids utilizing SINGLE SIPS strategy with liquid. NO STRAWS as patient demonstrated reduced cognitive ability to implement single sip strategies when straw was utilized. Administer medications whole in puree for ease. SLP will continue to follow. Factors that may increase risk of adverse event in presence of aspiration [name] & [name] 2021): Factors that may increase risk of adverse event in presence of aspiration [name] & [name] 2021): Weak cough; Frail or deconditioned; Limited mobility; Reduced cognitive function Recommendations/Plan: Swallowing Evaluation Recommendations Swallowing Evaluation Recommendations Recommendations: PO diet PO Diet Recommendation: Dysphagia 3 (Mechanical soft); Thin liquids (Level 0) Liquid Administration via: Cup; No straw Medication Administration:  Whole meds with puree Supervision: Full supervision/cueing for swallowing strategies Swallowing strategies  : Slow rate; Small bites/sips; Check for pocketing or oral holding Postural changes: Position pt fully upright for meals Oral care recommendations: Oral care BID (2x/day) Treatment Plan Treatment Plan Treatment recommendations: Therapy as outlined in treatment plan below Follow-up recommendations: Outpatient SLP Functional status assessment: Patient has had a recent decline in their functional status and demonstrates the ability to make significant improvements in function in a reasonable and predictable amount of time. Treatment frequency: Min 2x/week Treatment duration: 2 weeks Interventions: Aspiration precaution training; Oropharyngeal exercises; Compensatory techniques; Patient/family education; Diet toleration management by SLP; Trials of upgraded texture/liquids; Respiratory muscle strength  training Recommendations Recommendations for follow up therapy are one component of a multi-disciplinary discharge planning process, led by the attending physician.  Recommendations may be updated based on patient status, additional functional criteria and insurance authorization. Assessment: Orofacial Exam: Orofacial Exam Oral Cavity: Oral Hygiene: WFL Oral Cavity - Dentition: Dentures, not available; Missing dentition; Poor condition Anatomy: Anatomy: WFL Boluses Administered: Boluses Administered Boluses Administered: Thin liquids (Level 0); Mildly thick liquids (Level 2, nectar thick); Moderately thick liquids (Level 3, honey thick); Puree; Solid  Oral Impairment Domain: Oral Impairment Domain Lip Closure: Escape progressing to mid-chin Tongue control during bolus hold: Cohesive bolus between tongue to palatal seal Bolus preparation/mastication: Timely and efficient chewing and mashing Bolus transport/lingual motion: Brisk tongue motion Oral residue: Residue collection on oral structures Location of oral residue  : Tongue; Palate Initiation of pharyngeal swallow : Pyriform sinuses  Pharyngeal Impairment Domain: Pharyngeal Impairment Domain Soft palate elevation: No bolus between soft palate (SP)/pharyngeal wall (PW) Laryngeal elevation: Complete superior movement of thyroid  cartilage with complete approximation of arytenoids to epiglottic petiole Anterior hyoid excursion: Complete anterior movement Epiglottic movement: Complete inversion Laryngeal vestibule closure: Incomplete, narrow column air/contrast in laryngeal vestibule Pharyngeal stripping wave : Present - diminished Pharyngeal contraction (A/P view only): N/A Pharyngoesophageal segment opening: Complete distension and complete duration, no obstruction of flow Tongue base retraction: Narrow column of contrast or air between tongue base and PPW Pharyngeal residue: Collection of residue within or on pharyngeal structures Location of pharyngeal residue: Valleculae; Pyriform sinuses  Esophageal Impairment Domain: Esophageal Impairment Domain Esophageal clearance upright position: Complete clearance, esophageal coating Pill: Pill Consistency administered: Thin liquids (Level 0) Thin liquids (Level 0): [facility] Penetration/Aspiration Scale Score: Penetration/Aspiration Scale Score 1.  Material does not enter airway: Mildly thick liquids (Level 2, nectar thick); Moderately thick liquids (Level 3, honey thick); Puree; Solid; Pill 7.  Material enters airway, passes BELOW cords and not ejected out despite cough attempt by patient: Thin liquids (Level 0) Compensatory Strategies: Compensatory Strategies Compensatory strategies: Yes Other(comment): Effective (single sips) Effective Other(comment): Thin liquid (Level 0)   General Information: Caregiver present: No  Diet Prior to this Study: Regular; Thin liquids (Level 0)   Temperature : Normal   Respiratory Status: WFL   Supplemental O2: None (Room air)   History of Recent Intubation: No  Behavior/Cognition: Alert; Cooperative;  Requires cueing Self-Feeding Abilities: Needs assist with self-feeding Baseline vocal quality/speech: Hypophonia/low volume Volitional Cough: Able to elicit Volitional Swallow: Able to elicit Exam Limitations: No limitations Goal Planning: Prognosis for improved oropharyngeal function: Good Barriers to Reach Goals: Cognitive deficits No data recorded No data recorded Consulted and agree with results and recommendations: Patient Pain: Pain Assessment Pain Assessment: No/denies pain End of Session: Start Time:No data recorded Stop Time: No data recorded Time Calculation:No data recorded Charges: No data recorded SLP visit diagnosis: SLP Visit Diagnosis: Dysphagia, oropharyngeal phase (R13.12) Past Medical History: Past Medical History: Diagnosis Date  Chronic combined systolic and diastolic CHF (congestive heart failure) (HCC)   CKD (chronic kidney disease), stage III (HCC)   Hyperlipidemia   Hypertension   Lower extremity edema   Mild CAD   a. mild-mod by cath 05/2018.  Multiple myeloma (HCC) 11/15/2018  NICM (nonischemic cardiomyopathy) (HCC)   Peripheral neuropathy   Prostate cancer (HCC)   Status post XRT  Rheumatic fever   Stroke (HCC)   Trifascicular block   Type 2 diabetes mellitus (HCC)   Vertigo  Past Surgical History: Past Surgical History: Procedure Laterality Date  RIGHT HEART  CATH N/A 07/10/2018  Procedure: RIGHT HEART CATH;  Surgeon: Darlis Eisenmenger, MD;  [facility] INVASIVE CV LAB;  Service: Cardiovascular;  Laterality: N/A;  RIGHT/LEFT HEART CATH AND CORONARY ANGIOGRAPHY N/A 06/18/2018  Procedure: RIGHT/LEFT HEART CATH AND CORONARY ANGIOGRAPHY;  Surgeon: Millicent Ally, MD;  Location: [facility] INVASIVE CV LAB;  Service: Cardiovascular;  Laterality: N/A; Ashley A Ellin 09/12/2023, 9:51 AM  MR BRAIN WO CONTRAST Result Date: 09/12/2023 CLINICAL DATA:  Initial evaluation for acute altered mental status. EXAM: MRI HEAD WITHOUT CONTRAST TECHNIQUE: Multiplanar, multiecho pulse sequences of the brain and  surrounding structures were obtained without intravenous contrast. COMPARISON:  CT from earlier the same day as well as MRI from 09/08/2023 FINDINGS: Brain: Age-related cerebral atrophy with mild chronic microvascular ischemic disease. Multiple scattered remote cerebellar infarcts. Remote lacunar infarct at the posterior right lentiform nucleus. Evolving subacute infarct within the right cerebellum with mild residual diffusion signal abnormality, slightly less prominent from prior. Additional well-circumscribed foci of restricted diffusion involving the middle cerebellar peduncles again seen, similar to prior, and could reflect ischemic infarcts and/or evolving changes of wallerian degeneration as previously noted. No associated hemorrhage or significant regional mass effect. No other evidence for acute or interval infarction. Gray-white matter differentiation otherwise maintained. No acute or chronic intracranial blood products. No mass lesion, midline shift or mass effect. No hydrocephalus or extra-axial fluid collection. Pituitary gland and suprasellar region within normal limits. Vascular: Irregular flow voids throughout the vertebrobasilar system, similar to prior, and better evaluated on prior CTA. Anterior arterial intracranial flow voids are well maintained. Skull and upper cervical spine: Craniocervical junction within normal limits. Bone marrow signal intensity within normal limits. No scalp soft tissue abnormality. Sinuses/Orbits: Prior bilateral ocular lens replacement. Paranasal sinuses remain largely clear. Trace right mastoid effusion, of doubtful significance. Other: None. IMPRESSION: 1. Persistent foci of restricted diffusion involving the middle cerebellar peduncles, similar to prior, and could reflect ischemic infarcts and/or evolving changes of Wallerian degeneration as previously noted. 2. Evolving subacute infarct within the right cerebellum, slightly less prominent as compared to prior. No  associated hemorrhage or significant regional mass effect. 3. No other new acute intracranial abnormality. 4. Underlying age-related cerebral atrophy with mild chronic microvascular ischemic disease, with multiple remote cerebellar and right basal ganglia infarcts. Electronically Signed   By: Virgia Griffins M.D.   On: 09/12/2023 02:11   CT HEAD WO CONTRAST ( ) Result Date: 09/11/2023 CLINICAL DATA:  Initial evaluation for acute altered mental status. EXAM: CT HEAD WITHOUT CONTRAST TECHNIQUE: Contiguous axial images were obtained from the base of the skull through the vertex without intravenous contrast. RADIATION DOSE REDUCTION: This exam was performed according to the departmental dose-optimization program which includes automated exposure control, adjustment of the mA and/or kV according to patient size and/or use of iterative reconstruction technique. COMPARISON:  CT from 09/10/2023 and MRI from 09/08/2023 FINDINGS: Brain: Age-related cerebral atrophy with chronic microvascular ischemic disease. Multiple remote cerebellar infarcts again noted. Well-circumscribed hypodensities involving the cerebellum/middle cerebellar peduncles, consistent with edema, likely due to evolving wallerian degeneration and/or ischemia. Overall appearance is not significantly changed from previous exams. No other acute large vessel territory infarct. No acute intracranial hemorrhage. No mass lesion or midline shift. No hydrocephalus or extra-axial fluid collection. Vascular: No abnormal hyperdense vessel. Calcified atherosclerosis present at the skull base. Skull: Scalp soft tissues within normal limits.  Calvarium intact. Sinuses/Orbits: Globes and orbital soft tissues demonstrate no acute finding. Paranasal sinuses are largely clear. No mastoid effusion. Other: None. IMPRESSION:  1. Stable head CT with no significant interval change in appearance of the brain as compared to previous exams. 2. Evolving hypodensities involving  the cerebellum/middle cerebellar peduncles, consistent with evolving wallerian degeneration and/or ischemia, stable from prior. 3. No other new acute intracranial abnormality. 4. Underlying atrophy with chronic microvascular ischemic disease. Electronically Signed   By: Virgia Griffins M.D.   On: 09/11/2023 22:50   CT HAND RIGHT WO CONTRAST Result Date: 09/11/2023 CLINICAL DATA:  Pain and swelling EXAM: CT OF THE RIGHT HAND WITHOUT CONTRAST TECHNIQUE: Multidetector CT imaging of the right hand was performed according to the standard protocol. Multiplanar CT image reconstructions were also generated. RADIATION DOSE REDUCTION: This exam was performed according to the departmental dose-optimization program which includes automated exposure control, adjustment of the mA and/or kV according to patient size and/or use of iterative reconstruction technique. 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 Small ossicle posterior to the triquetrum, cortical discontinuity along presumed donor site, compatible with triquetral fracture, cannot exclude acute chronicity, correlate with dorsal point tenderness over the triquetrum. Scattered geodes along the carpus and along the distal ulnar articular surface and radial styloid articular surface. Mild degenerative spurring along the carpus. Ligaments Suboptimally assessed by CT. Muscles and Tendons Unremarkable Soft tissues Abnormal thickened appearance of the flexor retinaculum for example on image 105 SERIES 7. Subcutaneous edema along the dorsal-ulnar margin of the wrist. IMPRESSION: 1. Small ossicle posterior to the triquetrum, cortical discontinuity along presumed donor site, compatible with triquetral fracture, cannot exclude acute chronicity, correlate with dorsal point tenderness  over the triquetrum. 2. Abnormal thickened appearance of the flexor retinaculum, query remote flexor retinacular injury or prior release with resulting fibrosis. 3. 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 4. Scattered geodes along the carpus and along the distal ulnar articular surface and radial styloid articular surface. 5. Subcutaneous edema along the dorsal-ulnar margin of the wrist. Electronically Signed   By: Freida Jes M.D.   On: 09/11/2023 10:48   CT FOREARM RIGHT WO CONTRAST Result Date: 09/11/2023 CLINICAL DATA:  Pain and swelling in the forearm EXAM: CT OF THE RIGHT FOREARM WITHOUT CONTRAST TECHNIQUE: Multidetector CT imaging was performed according to the standard protocol. Multiplanar CT image reconstructions were also generated. RADIATION DOSE REDUCTION: This exam was performed according to the departmental dose-optimization program which includes automated exposure control, adjustment of the mA and/or kV according to patient size and/or use of iterative reconstruction technique. COMPARISON:  Elbow radiographs 09/04/2023 and wrist radiographs 09/02/2023 FINDINGS: Bones/Joint/Cartilage Elbow joint effusion noted. Spurring of the radial head, coronoid process, and olecranon. Scattered small geodes of the carpus distal ulnar articular surface as well as the radial styloid. Chondrocalcinosis of the TFCC disc and volar and dorsal to the carpus. Age-indeterminate dorsal triquetral fracture. Ligaments Suboptimally assessed by CT. Muscles and Tendons Unremarkable Soft tissues Mild subcutaneous edema along the ulnar side of the forearm. IMPRESSION: 1. Age-indeterminate dorsal triquetral fracture. 2. Elbow joint effusion. 3. Chondrocalcinosis of the TFCC disc and volar and dorsal to the carpus. 4. Scattered small geodes of the carpus distal ulnar articular surface as well as the  radial styloid. 5. Mild subcutaneous edema along the ulnar side of the forearm. Electronically Signed   By: Freida Jes M.D.   On: 09/11/2023 10:41  VAS US  UPPER EXTREMITY VENOUS DUPLEX Result Date: 09/10/2023 UPPER VENOUS STUDY  Patient Name:  Allen Dyer  Date of Exam:   09/10/2023 Medical Rec #: [id number]      Accession #:    [id number] Date of Birth: April 17, 1936      Patient Gender: M Patient Age:   66 years Exam Location:  [facility] Procedure:      VAS US  UPPER EXTREMITY VENOUS DUPLEX Referring Phys: Estill Hemming [facility] --------------------------------------------------------------------------------  Indications: Pain, and Swelling Limitations: Poor ultrasound/tissue interface and musculoskeletal features. Comparison Study: No prior study on file Performing Technologist: Carleene Chase RVS  Examination Guidelines: A complete evaluation includes B-mode imaging, spectral Doppler, color Doppler, and power Doppler as needed of all accessible portions of each vessel. Bilateral testing is considered an integral part of a complete examination. Limited examinations for reoccurring indications may be performed as noted.  Right Findings: +----------+------------+---------+-----------+----------+-------+ RIGHT     CompressiblePhasicitySpontaneousPropertiesSummary +----------+------------+---------+-----------+----------+-------+ IJV                      Yes       Yes                      +----------+------------+---------+-----------+----------+-------+ Subclavian               Yes       Yes                      +----------+------------+---------+-----------+----------+-------+ Axillary                 Yes       Yes                      +----------+------------+---------+-----------+----------+-------+ Brachial      Full                                          +----------+------------+---------+-----------+----------+-------+ Radial        Full       Yes       Yes                       +----------+------------+---------+-----------+----------+-------+ Ulnar         Full                                          +----------+------------+---------+-----------+----------+-------+ Cephalic      Full                                          +----------+------------+---------+-----------+----------+-------+ Basilic                  Yes       Yes                      +----------+------------+---------+-----------+----------+-------+  Left Findings: +----------+------------+---------+-----------+----------+-------+ LEFT      CompressiblePhasicitySpontaneousPropertiesSummary +----------+------------+---------+-----------+----------+-------+ Subclavian               Yes       Yes                      +----------+------------+---------+-----------+----------+-------+  Summary:  Right: No evidence of deep vein thrombosis in the upper extremity. No evidence of superficial vein thrombosis in the upper extremity. Tissue disturbance and subcutaneous edema noted throughout the right upper extremity.  Left: No evidence of thrombosis in the subclavian.  *See table(s) above for measurements and observations.  Diagnosing physician: Runell Countryman Electronically signed by Runell Countryman on 09/10/2023 at 3:44:44 PM.    Final    CT HEAD WO CONTRAST ( ) Result Date: 09/10/2023 CLINICAL DATA:  88 year old male with neurologic deficit. Bilateral cerebellar infarcts. Progressive signal abnormality in the cerebellar peduncles on MRI 2 days ago. Subsequent encounter. EXAM: CT HEAD WITHOUT CONTRAST TECHNIQUE: Contiguous axial images were obtained from the base of the skull through the vertex without intravenous contrast. RADIATION DOSE REDUCTION: This exam was performed according to the departmental dose-optimization program which includes automated exposure control, adjustment of the mA and/or kV according to patient size and/or use of iterative reconstruction  technique. COMPARISON:  Brain MRI 09/08/2023 and earlier. FINDINGS: Brain: Patchy, confluent hypodensity in the bilateral cerebellar hemispheres and cerebellar peduncles corresponding to recent MRI appearance. No hemorrhagic transformation. No posterior fossa mass effect. Basilar cisterns remain normal. No ventriculomegaly. Elsewhere the brainstem gray-white differentiation appears stable. Supratentorial gray-white differentiation is stable. Incidental dural calcification along the falx. No new cerebral edema identified. Vascular: Calcified atherosclerosis at the skull base. No suspicious intracranial vascular hyperdensity. Skull: Stable and intact. Sinuses/Orbits: Visualized paranasal sinuses and mastoids are stable and well aerated. Other: No acute orbit or scalp soft tissue finding. IMPRESSION: 1. Stable from recent MRI. Cerebellar and cerebellar peduncle infarct related edema with no hemorrhagic transformation or mass effect. 2. No new intracranial abnormality. Electronically Signed   By: Marlise Simpers M.D.   On: 09/10/2023 12:33   MR BRAIN WO CONTRAST Addendum Date: 09/09/2023 ADDENDUM REPORT: 09/09/2023 07:52 ADDENDUM: Study discussed by telephone with Dr. Bonnita Buttner, Neurology on 09/09/2023 at 07:49 . We discussed that when compared to 09/02/2023 the progressive and strikingly symmetric signal changes in the bilateral cerebellar peduncles now are most consistent with developing Wallerian degeneration in response to the bilateral cerebellar infarcts about a week ago. Those primary infarcts have partially faded now. Early middle cerebellar peduncle signal changes were present initially. No evidence of associated hemorrhage, no mass effect. 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 No hemorrhage or mass effect. Electronically Signed   By: Marlise Simpers M.D.   On: 09/09/2023 07:52   Result Date:  09/09/2023 CLINICAL DATA:  Neuro deficit, acute, stroke suspected EXAM: MRI HEAD WITHOUT CONTRAST TECHNIQUE: Multiplanar, multiecho pulse sequences of the brain and surrounding structures were obtained without intravenous contrast. COMPARISON:  CTA Head/neck Sep 02, 2023. FINDINGS: Brain: Acute infarcts in bilateral cerebellum and middle cerebellar peduncles. Associated edema without significant mass effect. Remote bilateral cerebellar infarcts. Remote infarct in the posterior limb right internal capsule. Patchy T2/FLAIR hyperintensities the white matter, compatible chronic microvascular disease. No evidence of midline shift, acute hemorrhage, mass lesion or hydrocephalus. Vascular: Better evaluated on same day CTA. Skull and upper cervical spine: Normal marrow signal. Sinuses/Orbits: Negative. Other: No mastoid effusions. IMPRESSION: Acute infarcts in bilateral cerebellum and middle cerebellar peduncles. Electronically Signed: By: Stevenson Elbe M.D. On: 09/08/2023 23:54   DG Elbow 2 Views Right Result Date: 09/04/2023 CLINICAL DATA:  Fall.  Elbow pain. EXAM: RIGHT ELBOW - 2 VIEW COMPARISON:  None Available. FINDINGS: Mild medial elbow joint space narrowing at the trochlea-coronoid articulation.  Mild chronic enthesopathic changes at the common flexor tendon origin at the medial epicondyle and common extensor tendon origin at the lateral epicondyle. There is elevation of the distal humeral fat pad suggesting an elbow joint effusion. Mild to moderate degenerative spurring of the tip of the coronoid process. Mild to moderate chronic enthesopathic change at the triceps insertion on the olecranon. No acute fracture or dislocation. IMPRESSION: 1. Mild medial elbow osteoarthritis. 2. Mild to moderate chronic enthesopathic change at the triceps insertion on the olecranon. 3. Elevation of the distal humeral fat pad suggesting an elbow joint effusion. Electronically Signed   By: Bertina Broccoli M.D.   On: 09/04/2023  15:48   ECHOCARDIOGRAM COMPLETE Result Date: 09/03/2023    ECHOCARDIOGRAM REPORT   Patient Name:   WELLS MABE Date of Exam: 09/03/2023 Medical Rec #:  [id number]     Height:       67.0 in Accession #:    [id number]    Weight:       214.9 lb Date of Birth:  [birth year]     BSA:          2.085 m Patient Age:    88 years      BP:           131/81 mmHg Patient Gender: M             HR:           67 bpm. Exam Location:  Inpatient Procedure: 2D Echo, Color Doppler, Cardiac Doppler and Intracardiac            Opacification Agent (Both Spectral and Color Flow Doppler were            utilized during procedure). Indications:    Stroke I63.9  History:        Patient has prior history of Echocardiogram examinations, most                 recent 09/12/2021.  Sonographer:    Hersey Lorenzo RDCS Referring Phys: [phone] PROSPER M AMPONSAH IMPRESSIONS  1. Left ventricular ejection fraction, by estimation, is 25 to 30%. Left ventricular ejection fraction by 2D MOD biplane is 30.8 %. The left ventricle has severely decreased function. The left ventricle demonstrates global hypokinesis. There is moderate  concentric left ventricular hypertrophy. Left ventricular diastolic parameters are consistent with Grade I diastolic dysfunction (impaired relaxation).  2. Right ventricular systolic function is normal. The right ventricular size is normal. Tricuspid regurgitation signal is inadequate for assessing PA pressure.  3. Left atrial size was mildly dilated.  4. The mitral valve is normal in structure. Trivial mitral valve regurgitation. No evidence of mitral stenosis.  5. The aortic valve is tricuspid. There is mild calcification of the aortic valve. Aortic valve regurgitation is mild.  6. Aortic dilatation noted. There is mild dilatation of the aortic root, measuring 40 mm. There is mild dilatation of the ascending aorta, measuring 43 mm.  7. The inferior vena cava is normal in size with greater than 50% respiratory variability, suggesting  right atrial pressure of 3 mmHg. FINDINGS  Left Ventricle: Left ventricular ejection fraction, by estimation, is 25 to 30%. Left ventricular ejection fraction by 2D MOD biplane is 30.8 %. The left ventricle has severely decreased function. The left ventricle demonstrates global hypokinesis. The left ventricular internal cavity size was normal in size. There is moderate concentric left ventricular hypertrophy. Left ventricular diastolic parameters are consistent with Grade I diastolic dysfunction (impaired relaxation). Right Ventricle: The  right ventricular size is normal. No increase in right ventricular wall thickness. Right ventricular systolic function is normal. Tricuspid regurgitation signal is inadequate for assessing PA pressure. Left Atrium: Left atrial size was mildly dilated. Right Atrium: Right atrial size was normal in size. Pericardium: There is no evidence of pericardial effusion. Mitral Valve: The mitral valve is normal in structure. Trivial mitral valve regurgitation. No evidence of mitral valve stenosis. Tricuspid Valve: The tricuspid valve is normal in structure. Tricuspid valve regurgitation is trivial. Aortic Valve: The aortic valve is tricuspid. There is mild calcification of the aortic valve. Aortic valve regurgitation is mild. Aortic regurgitation PHT measures 851 msec. Pulmonic Valve: The pulmonic valve was normal in structure. Pulmonic valve regurgitation is not visualized. Aorta: Aortic dilatation noted. There is mild dilatation of the aortic root, measuring 40 mm. There is mild dilatation of the ascending aorta, measuring 43 mm. Venous: The inferior vena cava is normal in size with greater than 50% respiratory variability, suggesting right atrial pressure of 3 mmHg. IAS/Shunts: No atrial level shunt detected by color flow Doppler.  LEFT VENTRICLE PLAX 2D                        Biplane EF (MOD) LVIDd:         5.20 cm         LV Biplane EF:   Left LVIDs:         4.50 cm                           ventricular LV PW:         1.10 cm                          ejection LV IVS:        1.10 cm                          fraction by LVOT diam:     2.60 cm                          2D MOD LV SV:         66                               biplane is LV SV Index:   32                               30.8 %. LVOT Area:     5.31 cm                                Diastology                                LV e' lateral:   3.41 cm/s LV Volumes (MOD)               LV E/e' lateral: 14.1 LV vol d, MOD    157.0 ml A2C: LV vol d, MOD    199.0 ml A4C: LV vol s, MOD    110.0 ml A2C: LV  vol s, MOD    134.0 ml A4C: LV SV MOD A2C:   47.0 ml LV SV MOD A4C:   199.0 ml LV SV MOD BP:    54.5 ml RIGHT VENTRICLE             IVC RV S prime:     11.10 cm/s  IVC diam: 1.20 cm TAPSE (M-mode): 2.2 cm LEFT ATRIUM           Index        RIGHT ATRIUM           Index LA diam:      2.30 cm 1.10 cm/m   RA Area:     15.80 cm LA Vol (A4C): 40.3 ml 19.33 ml/m  RA Volume:   40.60 ml  19.47 ml/m  AORTIC VALVE LVOT Vmax:   75.90 cm/s LVOT Vmean:  56.200 cm/s LVOT VTI:    0.124 m AI PHT:      851 msec  AORTA Ao Root diam: 4.00 cm Ao Asc diam:  4.30 cm MITRAL VALVE MV Area (PHT): 4.08 cm    SHUNTS MV Decel Time: 186 msec    Systemic VTI:  0.12 m MV E velocity: 48.00 cm/s  Systemic Diam: 2.60 cm MV A velocity: 67.40 cm/s MV E/A ratio:  0.71 Dalton McleanMD Electronically signed by Archer Bear Signature Date/Time: 09/03/2023/7:34:48 PM    Final    CT ANGIO HEAD NECK W WO CM Result Date: 09/02/2023 CLINICAL DATA:  Neuro deficit, concern for stroke/TIA. Dizziness for 2 weeks. Recent fall and hit front of head. EXAM: CT ANGIOGRAPHY HEAD AND NECK WITH AND WITHOUT CONTRAST TECHNIQUE: Multidetector CT imaging of the head and neck was performed using the standard protocol during bolus administration of intravenous contrast. Multiplanar CT image reconstructions and MIPs were obtained to evaluate the vascular anatomy. Carotid stenosis measurements (when  applicable) are obtained utilizing NASCET criteria, using the distal internal carotid diameter as the denominator. RADIATION DOSE REDUCTION: This exam was performed according to the departmental dose-optimization program which includes automated exposure control, adjustment of the mA and/or kV according to patient size and/or use of iterative reconstruction technique. CONTRAST:  60mL OMNIPAQUE  IOHEXOL  350 MG/ML SOLN COMPARISON:  Same day CT head and cervical spine. FINDINGS: CTA NECK FINDINGS Aortic arch: Standard configuration of the aortic arch. Imaged portion shows no evidence of aneurysm or dissection. No significant stenosis of the major arch vessel origins. Pulmonary arteries: As permitted by contrast timing, there are no filling defects in the visualized pulmonary arteries. Subclavian arteries: The subclavian arteries are patent bilaterally. Right carotid system: No evidence of dissection, stenosis (50% or greater), or occlusion. Tortuosity of the proximal common carotid artery. There is additional tortuosity throughout the internal carotid artery. Left carotid system: No evidence of dissection, stenosis (50% or greater), or occlusion. Minimal calcified atherosclerosis at the carotid bifurcation without stenosis. Mild tortuosity of the proximal common carotid artery. Additional tortuosity of the cervical ICA most pronounced distally. Vertebral arteries: The right vertebral artery origin is patent with abrupt occlusion of the proximal right V1 segment. There is reconstitution of the distal V1 and proximal V2 segments which is severely diminished in caliber and appears irregular with additional occlusion of the right V2 segment near the level of C4. Irregular reconstitution of the distal V2 segment. Calcified atherosclerosis noted along the V3 and proximal V4 segments. Left vertebral artery origin is patent with diminished caliber and intraluminal contrast of the left V2 segment with occlusion at approximately  the level of C4-5. The distal V2, V3, and V4 segments are occluded. Atherosclerosis along the proximal left V4 segment. Skeleton: No acute findings. Degenerative changes in the cervical spine. Disc space narrowing most pronounced at C6-7. Prominent anterior endplate osteophytes at multiple levels in the cervical spine. Edentulous maxilla. Other neck: The visualized airway is patent. No cervical lymphadenopathy. Upper chest: Visualized lung apices are clear. Review of the MIP images confirms the above findings CTA HEAD FINDINGS ANTERIOR CIRCULATION: The intracranial ICAs are patent bilaterally. Atherosclerosis of the bilateral carotid siphons resulting in moderate stenosis. There is additional severe stenosis of the left supraclinoid ICA. No proximal occlusion, aneurysm, or vascular malformation. MCAs: Patent bilaterally. Mild stenosis of the proximal M1 segment right MCA. Additional mild narrowing of a distal M2 segment of the right MCA. ACAs: The A1 segment of the left ACA is not visualized and may be stenotic secondary to atherosclerosis versus congenitally hypoplastic. Possible fenestration of the anterior communicating artery. The ACAs are patent distally. POSTERIOR CIRCULATION: Occlusion of the intracranial vertebral arteries as noted above. The basilar artery is patent but is diminished in caliber and irregular with moderate narrowing along the proximal aspect. The AICA is visualized on the left. The PICA are not well visualized. Bilateral superior cerebellar arteries noted. The PCAs are patent bilaterally but appear small in caliber. Moderate to severe narrowing of the P2 segment right PCA significantly diminished caliber of distal right PCA branches without evidence of focal occlusion. There is additional mild irregularity of the proximal P2 segment on the left. Severe stenosis of the posterior P2 segment of the left PCA. Diminished caliber of distal left PCA branches without focal occlusion. Venous sinuses:  As permitted by contrast timing, patent. Anatomic variants: None Review of the MIP images confirms the above findings IMPRESSION: Occlusion of the bilateral vertebral arteries as above. Irregular reconstitution of the distal V2 segment of the right vertebral artery with occlusion of the right V3 and V4 segments. The left vertebral artery is occluded at the V2 segment from the level of C4-5. Additional irregularity and moderate narrowing of the proximal basilar artery. The basilar artery is irregular throughout its course without occlusion. Diminutive caliber of the bilateral posterior cerebral arteries. Moderate to severe stenosis of the P2 segment right PCA. Additional severe stenosis of the posterior P2 segment of the left PCA. Severely diminished caliber of distal PCA branches bilaterally without evidence of focal occlusion. Atherosclerosis of the carotid siphons. Focal severe stenosis of the left supraclinoid ICA. Additional moderate stenosis of the bilateral cavernous ICAs. Tortuosity of the common carotid arteries and cervical internal carotid arteries which may be secondary to chronic hypertension. Electronically Signed   By: Denny Flack M.D.   On: 09/02/2023 20:00   DG Wrist Complete Right Result Date: 09/02/2023 CLINICAL DATA:  Pain after fall.  Swelling. EXAM: RIGHT WRIST - COMPLETE 3+ VIEW COMPARISON:  None Available. FINDINGS: There is no evidence of fracture or dislocation. Scattered osteoarthritis. Mild chondrocalcinosis. No erosive or bony destructive change. There is generalized subcutaneous edema. IMPRESSION: 1. Generalized subcutaneous edema. No fracture or subluxation of the right wrist. 2. Scattered osteoarthritis. Electronically Signed   By: Chadwick Colonel M.D.   On: 09/02/2023 18:25   MR BRAIN WO CONTRAST Result Date: 09/02/2023 CLINICAL DATA:  Initial evaluation for acute dizziness and vertigo. EXAM: MRI HEAD WITHOUT CONTRAST TECHNIQUE: Multiplanar, multiecho pulse sequences of the  brain and surrounding structures were obtained without intravenous contrast. COMPARISON:  CT from 08/25/2023. FINDINGS: Brain: Diffuse prominence  of the CSF containing spaces compatible with generalized cerebral atrophy, moderately advanced in nature. Patchy T2/FLAIR hyperintensity involving the supratentorial cerebral white matter, consistent with chronic small vessel ischemic disease, relatively mild for age. Patchy restricted diffusion seen involving the right greater than left cerebellar hemispheres (series 6, image 6). Associated T2/FLAIR signal abnormality with mild signal loss on ADC map, consistent with early subacute ischemic infarcts. Associated minimal petechial blood products at the right cerebellum without hemorrhagic transformation. No significant regional mass effect. These changes are superimposed on a few scattered underlying chronic cerebellar infarcts. No mass lesion, midline shift or mass effect. No hydrocephalus or extra-axial fluid collection. Pituitary gland within normal limits. Vascular: Irregular flow voids within the V4 segments bilaterally, which could reflect slow flow and/or occlusion. Anterior circulation flow voids maintained. Skull and upper cervical spine: Degenerative osteoarthritic changes about the C1-2 articulations without significant stenosis. Bone marrow signal intensity within normal limits. No scalp soft tissue abnormality. Sinuses/Orbits: Prior bilateral ocular lens replacement. Paranasal sinuses are clear. No mastoid effusion. Other: None. IMPRESSION: 1. Patchy early subacute ischemic infarcts involving the right greater than left cerebellar hemispheres. Associated minimal petechial blood products at the right cerebellum without hemorrhagic transformation or significant regional mass effect. 2. Irregular flow voids within the V4 segments bilaterally, which could reflect slow flow and/or occlusion. Correlation with dedicated vascular imaging recommended. 3. Underlying  moderately advanced cerebral atrophy with mild chronic small vessel ischemic disease. Electronically Signed   By: Virgia Griffins M.D.   On: 09/02/2023 18:12   CT Head Wo Contrast Result Date: 09/02/2023 CLINICAL DATA:  Fall 2 days ago, dizziness EXAM: CT HEAD WITHOUT CONTRAST CT CERVICAL SPINE WITHOUT CONTRAST TECHNIQUE: Multidetector CT imaging of the head and cervical spine was performed following the standard protocol without intravenous contrast. Multiplanar CT image reconstructions of the cervical spine were also generated. RADIATION DOSE REDUCTION: This exam was performed according to the departmental dose-optimization program which includes automated exposure control, adjustment of the mA and/or kV according to patient size and/or use of iterative reconstruction technique. COMPARISON:  CT brain, 08/25/2023, same-day MR brain FINDINGS: CT HEAD FINDINGS Brain: Hypodense lesions in the cerebellar hemispheres, measuring 3.4 x 3.2 cm on the right (series 3, image 7) and 0.8 cm and 1.8 x 1.2 cm on the left (series 3, image 8). Vascular: No hyperdense vessel or unexpected calcification. Skull: Normal. Negative for fracture or focal lesion. Sinuses/Orbits: No acute finding. Other: None. CT CERVICAL SPINE FINDINGS Alignment: Straightening of the normal cervical lordosis. Skull base and vertebrae: No acute fracture. No primary bone lesion or focal pathologic process. Soft tissues and spinal canal: No prevertebral fluid or swelling. No visible canal hematoma. Disc levels: Mild-to-moderate multilevel cervical disc degenerative disease throughout the cervical spine. Upper chest: Negative. Other: None. IMPRESSION: 1. Hypodense lesions in the cerebellar hemispheres, measuring 3.4 x 3.2 cm on the right and 0.8 cm and 1.8 x 1.2 cm on the left. Please see forthcoming MR for further characterization. 2. No acute fracture or static subluxation of the cervical spine. 3. Mild-to-moderate multilevel cervical disc  degenerative disease throughout the cervical spine. Electronically Signed   By: Fredricka Jenny M.D.   On: 09/02/2023 17:16   CT Cervical Spine Wo Contrast Result Date: 09/02/2023 CLINICAL DATA:  Fall 2 days ago, dizziness EXAM: CT HEAD WITHOUT CONTRAST CT CERVICAL SPINE WITHOUT CONTRAST TECHNIQUE: Multidetector CT imaging of the head and cervical spine was performed following the standard protocol without intravenous contrast. Multiplanar CT image reconstructions of the cervical  spine were also generated. RADIATION DOSE REDUCTION: This exam was performed according to the departmental dose-optimization program which includes automated exposure control, adjustment of the mA and/or kV according to patient size and/or use of iterative reconstruction technique. COMPARISON:  CT brain, 08/25/2023, same-day MR brain FINDINGS: CT HEAD FINDINGS Brain: Hypodense lesions in the cerebellar hemispheres, measuring 3.4 x 3.2 cm on the right (series 3, image 7) and 0.8 cm and 1.8 x 1.2 cm on the left (series 3, image 8). Vascular: No hyperdense vessel or unexpected calcification. Skull: Normal. Negative for fracture or focal lesion. Sinuses/Orbits: No acute finding. Other: None. CT CERVICAL SPINE FINDINGS Alignment: Straightening of the normal cervical lordosis. Skull base and vertebrae: No acute fracture. No primary bone lesion or focal pathologic process. Soft tissues and spinal canal: No prevertebral fluid or swelling. No visible canal hematoma. Disc levels: Mild-to-moderate multilevel cervical disc degenerative disease throughout the cervical spine. Upper chest: Negative. Other: None. IMPRESSION: 1. Hypodense lesions in the cerebellar hemispheres, measuring 3.4 x 3.2 cm on the right and 0.8 cm and 1.8 x 1.2 cm on the left. Please see forthcoming MR for further characterization. 2. No acute fracture or static subluxation of the cervical spine. 3. Mild-to-moderate multilevel cervical disc degenerative disease throughout the  cervical spine. Electronically Signed   By: Fredricka Jenny M.D.   On: 09/02/2023 17:16    Labs:  Basic Metabolic Panel: No results for input(s): "NA", "K", "CL", "CO2", "GLUCOSE", "BUN", "CREATININE", "CALCIUM ", "MG", "PHOS" in the last 168 hours.   CBC: No results for input(s): "WBC", "NEUTROABS", "HGB", "HCT", "MCV", "PLT" in the last 168 hours.   CBG: Recent Labs  Lab 09/25/23 2156 09/26/23 0611 09/26/23 1217 09/26/23 1719 09/26/23 1955  GLUCAP 151* 85 124* 246* 282*   Family history.  Father with congestive heart failure mother with cancer.  Denies any colon cancer esophageal cancer or rectal cancer  Brief HPI:   Allen Dyer is a 88 y.o. right-handed male with history significant for chronic combined systolic and diastolic congestive heart failure, nonischemic cardiomyopathy followed by Dr. Micael Adas as well as Dr. Mitzie Anda, CVA, prostate cancer status post XRT/BPH followed by Dr. Freddi Jaeger, GERD gout multiple myeloma followed by Dr.Gorsuch, hyperlipidemia, hypertension, diabetes mellitus peripheral neuropathy, CKD stage IV.  Per chart review patient lives with daughter.  Reportedly independent prior to admission.  Presented 09/02/2023 with progressive unsteady gait times several weeks as well as falls x 2.  MRI showed patchy early subacute ischemic infarcts involving the right greater than left cerebellar hemisphere.  Associated minimal petechial blood products of the right cerebellum without hemorrhagic transformation.  Patient reportedly injured her right wrist after a fall x-ray showed no fracture.  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  Irregular reconstitution of the distal V2 segment of the right vertebral artery with occlusion of the right V3 and V4 segments.  Left vertebral artery occluded at the V2 segment from the level of C4-5.  CT cervical spine showed hypodense lesions in the cerebellar hemisphere measuring 3.4 x 3.2 cm on the right 0.8 cm and 1.8 x 1.1 cm on the left.   No fracture or dislocation of CT cervical spine.  Admission chemistries unremarkable except glucose 171 BUN 38 creatinine 2.28 urine culture greater than 100,000 Klebsiella oxytoca, hemoglobin A1c of 7.  Echocardiogram with ejection fraction of 25 to 30% and grade 1 diastolic dysfunction left ventricle demonstrating global hypokinesis.  Neurology follow-up placed on aspirin  as well as Plavix  for CVA prophylaxis x 3 months  then Plavix  alone.  Lovenox  for DVT prophylaxis.  Reports chronic bladder urgency received 2 days of Rocephin  for UTI transition to Duricef as well as developing urinary retention and elevated bladder scans of 800 necessitating the need for urology consult with Foley tube placed 09/06/2023.Aaron Aas  Tolerating a regular diet.  Therapy evaluations completed due to patient's decreased functional mobility was admitted for a comprehensive rehab program.   Hospital Course: Allen Dyer was admitted to rehab 09/05/2023 for inpatient therapies to consist of PT, ST and OT at least three hours five days a week. Past admission physiatrist, therapy team and rehab RN have worked together to provide customized collaborative inpatient rehab.  Pertaining to patient's bilateral cerebellar infarct right more than left secondary large vessel disease from severe posterior circulation stenosis remained stable.  He will continue low-dose aspirin  and Plavix  x 3 months then Plavix  alone.  Lovenox  for DVT prophylaxis.  Patient was slow to initiate with some tasks placed on trial of Provigil .  Klebsiella oxytoca UTI completing course of Duricef no hematuria or dysuria noted.  CKD stage IV follow-up monitoring of chemistries will need outpatient follow-up.  History of chronic combined systolic diastolic congestive heart failure ejection fraction 25 to 30% monitoring for any signs of fluid overload remained on Demadex  decreased to 30 mg daily as blood pressure has been soft and  advised follow-up cardiology services as well as  nonischemic cardiomyopathy continued on Imdur  15 mg daily as well as Coreg  3.125 mg twice daily as well as the addition of low-dose Inderal.  History of prostate cancer and BPH followed by urology services Dr. Freddi Jaeger continued on Flomax  as directed.  He did initially have intermittent bouts of urinary retention necessitating the need for an indwelling Foley tube which was later removed after 7 days with voiding trial and postvoid residuals good.  .  Blood sugars controlled hemoglobin A1c of 7 remained on Glucotrol .  Noted obesity BMI 33.67 dietary follow-up.  He did have some right wrist pain due to fall wearing a wrist brace x-rays without fracture however CT of the forearm and right hand did show an age-indeterminate dorsal triquetral fracture as well as some mild subcutaneous edema along the ulnar side of the forearm and patient would follow-up outpatient with Dr Marce Sensing and placed in a volar splint.  Bouts of constipation resolved with laxative assistance.   Blood pressures were monitored on TID basis and remained controlled and monitored  Diabetes has been monitored with ac/hs CBG checks and SSI was use prn for tighter BS control.    Rehab course: During patient's stay in rehab weekly team conferences were held to monitor patient's progress, set goals and discuss barriers to discharge. At admission, patient required minimal assist 75 feet quad cane minimal assist sit to stand  Physical exam.  Blood pressure 136/80 pulse 79 temperature 98.7 respirations 20 oxygen saturation is 91% room air Constitutional.  No acute distress HEENT Head.  Normocephalic and atraumatic Eyes.  Pupils round and reactive to light no discharge without nystagmus Neck.  Supple nontender no JVD without thyromegaly Cardiac regular rate and rhythm without any extra sounds or murmur heard Abdomen.  Soft nontender positive bowel sounds without rebound Extremities.  No clubbing cyanosis or edema Skin.  Warm dry and  intact Neurologic.  Alert to person place and month but had difficult with year with slow delay in processing.  Mild dysarthria fully intelligible Cranial nerves intact Motor.  Left upper extremity 4+/5 deltoid biceps tricep and grip Right upper  extremity 3/5 deltoid 3/5 bicep 3/5 tricep 3/5 grip Left lower extremity hip flexors 4/5 KE 4+/5 ADF/5 APF 4/5 Right lower extremity hip flexors 4/5 KE 4+/5 ADF 4/5 APF 4/5  He/She  has had improvement in activity tolerance, balance, postural control as well as ability to compensate for deficits. He/She has had improvement in functional use RUE/LUE  and RLE/LLE as well as improvement in awareness.  Requires minimal assist for rolling to his right and moderate assist for rolling to the left.  Supine-sitting edge with bed flat needing mod max assist for trunk support.  Requires max assist for forward scooting to achieve feet flat at edge of bed.  Initially can sit with supervision for the first 3 to 4 minutes but fades to requiring intermittent minimal assist due to fatigue and posterior lean.  Completed SB transfers with mod max assist for edge of bed and wheelchair with cues.  Working with energy conservation techniques.  During ADLs he transfer from wheelchair to mat with slide board with focus on maintaining forward weight shift and maintaining feet in proper positioning.  Slowly patient was able to transfer with minimal assist with max cues for mechanics.  Progressed to sit stands with the Evo walker progressing the height of the Marlea Silvers as he stood for 10 reps.Aaron Aas  Speech follow-up for speech intelligibility and cognitive retraining.  Patient oriented to month and year independently.  He was not oriented to day of week or holiday.  He later recalled it was conclusion of sessions independent.  Patient recalled 1/4 speech intelligibility strategies independently.  Patient warranting supervision to minimal assist for pausing rate and over articulation with maintenance of  75% intelligibility in known context at word phrase level.  Full family teaching completed plan discharged home       Disposition:  Discharge disposition: 06-Home-Health Care Svc        Diet: Diabetic diet/mechanical soft  Special Instructions: No driving smoking or alcohol  Plan aspirin  81 mg daily and Plavix  75 mg daily x 3 months total  Continue volar splint to right upper extremity until follow-up orthopedic service Dr. Agarwala  Medications at discharge. 1.  Tylenol  as needed 2.  Aspirin  81 mg p.o. daily 3.  Coreg  3.125 mg p.o. twice daily 4.  Oyster shell calcium  500 mg daily 5.  Plavix  75 mg p.o. daily 6.  Vitamin B12 1000 mcg every morning 7.  Neurontin  100 mg bedtime 8.  Glucotrol  5 mg p.o.  twice daily 9.  Imdur  15 mg every morning 10.  Antivert  12.5 mg  daily as needed dizziness 11.  MiraLAX  daily as needed hold for loose stools 12.  Klor-Con  20 mill equivalents p.o. daily 13.  Flomax  0.4 mg p.o. twice daily 14.  Demadex  30 mg p.o. twice daily 15.  Vitamin C 4000 mg every morning 16.  Garlic capsules 1000 mg every morning 17.  Omega-3 fatty acids 1200 mg daily 18.  Voltaren  gel 2 g 4 times daily to affected area 19.  Provigil  100 mg daily 20.  Scopolamine  patch changes directed 21.  Colace 100 mg daily 22. cyanocobalamin  1000 mcg daily 23 Inderal 10 mg daily   30-35 minutes were spent completing discharge summary and discharge planning  Discharge Instructions     Ambulatory referral to Neurology   Complete by: As directed    An appointment is requested in approximately: 4 weeks bilateral cerebellar infarction right greater than left   Ambulatory referral to Physical Medicine Rehab   Complete by:  As directed    Moderate complexity follow-up 1 to 2 weeks bilateral cerebellar infarct right greater than left        Follow-up Information     Raulkar, Keven Pel, MD Follow up.   Specialty: Physical Medicine and Rehabilitation Why: Office to call  for appointment Contact information: [address] [place] [address] [phone]         Lahoma Pigg, MD Follow up.   Specialty: Urology Why: Call for appointment Contact information: [address] [place] [address] [phone]         Jacqueline Matsu, MD Follow up.   Specialty: Cardiology Why: Call for appointment Contact information: [address] [place] [address] [phone]         Darlis Eisenmenger, MD Follow up.   Specialty: Cardiology Why: Call for appointment Contact information: [address] [place] [address] [phone]         Almeda Jacobs, MD Follow up.   Specialty: Hematology and Oncology Why: As needed Contact information: [address] [place] [address] [phone]         Merrill Abide, MD Follow up.   Specialty: Orthopedic Surgery Why: Call for appointment in regards to age-indeterminate right upper extremity dorsal triquetral fracture Contact information: [address]  [place] [address] [phone]                 Signed: Everlyn Hockey Olena Willy 09/27/2023, 4:50 AM

## 2023-09-06 ENCOUNTER — Inpatient Hospital Stay (HOSPITAL_COMMUNITY): Admission: RE | Admit: 2023-09-06 | Source: Ambulatory Visit

## 2023-09-06 DIAGNOSIS — I639 Cerebral infarction, unspecified: Secondary | ICD-10-CM | POA: Diagnosis not present

## 2023-09-06 LAB — COMPREHENSIVE METABOLIC PANEL WITH GFR
ALT: 9 U/L (ref 0–44)
AST: 12 U/L — ABNORMAL LOW (ref 15–41)
Albumin: 2.5 g/dL — ABNORMAL LOW (ref 3.5–5.0)
Alkaline Phosphatase: 46 U/L (ref 38–126)
Anion gap: 12 (ref 5–15)
BUN: 49 mg/dL — ABNORMAL HIGH (ref 8–23)
CO2: 24 mmol/L (ref 22–32)
Calcium: 8.9 mg/dL (ref 8.9–10.3)
Chloride: 100 mmol/L (ref 98–111)
Creatinine, Ser: 2.8 mg/dL — ABNORMAL HIGH (ref 0.61–1.24)
GFR, Estimated: 21 mL/min — ABNORMAL LOW (ref >60)
Glucose, Bld: 147 mg/dL — ABNORMAL HIGH (ref 70–99)
Potassium: 4 mmol/L (ref 3.5–5.1)
Sodium: 136 mmol/L (ref 135–145)
Total Bilirubin: 0.4 mg/dL (ref 0.0–1.2)
Total Protein: 6.7 g/dL (ref 6.5–8.1)

## 2023-09-06 LAB — CBC WITH DIFFERENTIAL/PLATELET
Abs Immature Granulocytes: 0.02 10*3/uL (ref 0.00–0.07)
Basophils Absolute: 0 10*3/uL (ref 0.0–0.1)
Basophils Relative: 0 %
Eosinophils Absolute: 0 10*3/uL (ref 0.0–0.5)
Eosinophils Relative: 1 %
HCT: 32.9 % — ABNORMAL LOW (ref 39.0–52.0)
Hemoglobin: 10.7 g/dL — ABNORMAL LOW (ref 13.0–17.0)
Immature Granulocytes: 0 %
Lymphocytes Relative: 13 %
Lymphs Abs: 0.9 10*3/uL (ref 0.7–4.0)
MCH: 32.6 pg (ref 26.0–34.0)
MCHC: 32.5 g/dL (ref 30.0–36.0)
MCV: 100.3 fL — ABNORMAL HIGH (ref 80.0–100.0)
Monocytes Absolute: 0.9 10*3/uL (ref 0.1–1.0)
Monocytes Relative: 14 %
Neutro Abs: 4.9 10*3/uL (ref 1.7–7.7)
Neutrophils Relative %: 72 %
Platelets: 174 10*3/uL (ref 150–400)
RBC: 3.28 MIL/uL — ABNORMAL LOW (ref 4.22–5.81)
RDW: 13.2 % (ref 11.5–15.5)
WBC: 6.7 10*3/uL (ref 4.0–10.5)
nRBC: 0 % (ref 0.0–0.2)

## 2023-09-06 LAB — GLUCOSE, CAPILLARY
Glucose-Capillary: 151 mg/dL — ABNORMAL HIGH (ref 70–99)
Glucose-Capillary: 220 mg/dL — ABNORMAL HIGH (ref 70–99)
Glucose-Capillary: 217 mg/dL — ABNORMAL HIGH (ref 70–99)
Glucose-Capillary: 234 mg/dL — ABNORMAL HIGH (ref 70–99)

## 2023-09-06 MED ORDER — DICLOFENAC SODIUM 1 % EX GEL
2.0000 g | Freq: Four times a day (QID) | CUTANEOUS | Status: DC
  Administered 2023-09-06 – 2023-09-27 (×49): 2 g via TOPICAL
  Filled 2023-09-06: qty 100

## 2023-09-06 MED ORDER — LIDOCAINE HCL URETHRAL/MUCOSAL 2 % EX GEL
CUTANEOUS | Status: DC | PRN
  Administered 2023-09-06 – 2023-09-14 (×2): 6 via TOPICAL
  Filled 2023-09-06 (×2): qty 6

## 2023-09-06 NOTE — Progress Notes (Signed)
   09/06/23 2358  BiPAP/CPAP/SIPAP  $ Non-Invasive Ventilator  Non-Invasive Vent Subsequent  BiPAP/CPAP/SIPAP Pt Type Adult  BiPAP/CPAP/SIPAP Resmed  Mask Type Full face mask  Mask Size Medium  PEEP 10 cmH20  FiO2 (%) 21 %  Patient Home Machine No  Patient Home Mask No  Patient Home Tubing No  Device Plugged into RED Power Outlet Yes  BiPAP/CPAP /SiPAP Vitals  Pulse Rate 76  Resp 16  SpO2 98 %  Bilateral Breath Sounds Clear;Diminished  MEWS Score/Color  MEWS Score 0  MEWS Score Color Green

## 2023-09-06 NOTE — Progress Notes (Signed)
 PROGRESS NOTE   Subjective/Complaints: C/o right wrist pain- agreeable to scheduled voltaren  gel, discussed XR results with him and his daughter, OT removed brace Disappointed with his current deficits  ROS: +right wrist pain   Objective:   DG Elbow 2 Views Right Result Date: 09/04/2023 CLINICAL DATA:  Fall.  Elbow pain. EXAM: RIGHT ELBOW - 2 VIEW COMPARISON:  None Available. FINDINGS: Mild medial elbow joint space narrowing at the trochlea-coronoid articulation. Mild chronic enthesopathic changes at the common flexor tendon origin at the medial epicondyle and common extensor tendon origin at the lateral epicondyle. There is elevation of the distal humeral fat pad suggesting an elbow joint effusion. Mild to moderate degenerative spurring of the tip of the coronoid process. Mild to moderate chronic enthesopathic change at the triceps insertion on the olecranon. No acute fracture or dislocation. IMPRESSION: 1. Mild medial elbow osteoarthritis. 2. Mild to moderate chronic enthesopathic change at the triceps insertion on the olecranon. 3. Elevation of the distal humeral fat pad suggesting an elbow joint effusion. Electronically Signed   By: Bertina Broccoli M.D.   On: 09/04/2023 15:48   Recent Labs    09/05/23 2114 09/06/23 0542  WBC 7.6 6.7  HGB 11.1* 10.7*  HCT 33.9* 32.9*  PLT 173 174   Recent Labs    09/04/23 0558 09/05/23 2114 09/06/23 0542  NA 140  --  136  K 3.6  --  4.0  CL 101  --  100  CO2 26  --  24  GLUCOSE 141*  --  147*  BUN 38*  --  49*  CREATININE 2.71* 2.79* 2.80*  CALCIUM  9.0  --  8.9    Intake/Output Summary (Last 24 hours) at 09/06/2023 1045 Last data filed at 09/06/2023 0600 Gross per 24 hour  Intake --  Output 468 ml  Net -468 ml        Physical Exam: Vital Signs Blood pressure (!) (P) 144/76, pulse (P) 79, temperature 98 F (36.7 C), resp. rate 17, height 5\' 7"  (1.702 m), weight 95.4 kg, SpO2  98%. Gen: no distress, normal appearing HEENT: oral mucosa pink and moist, NCAT Cardio: Reg rate Chest: normal effort, normal rate of breathing Abd: soft, non-distended Ext: no edema Psych: pleasant, normal affect Skin: intact MOTOR: LUE: 4+/5 Deltoid, 4+/5 Biceps, 4+/5 Triceps,4+/5 Grip RUE: 3/5 Deltoid, 3/5 Biceps, 3/5 Triceps, 3/5 Grip   LLE: HF 4/5, KE 4+/5, ADF 4/5, APF 4/5 RLE: HF 4/5, KE 4+/5, ADF 4/5, APF 4/5   REFLEXES: 2/4 throughout, bilateral flexor plantar response, no Hoffman's, no clonus   SENSORY: Normal to touch all 4 extremities   Coordination: Normal finger to nose LUE   No hypertonia noted   MSK: R wrist ttp, wearing wrist brace   Assessment/Plan: 1. Functional deficits which require 3+ hours per day of interdisciplinary therapy in a comprehensive inpatient rehab setting. Physiatrist is providing close team supervision and 24 hour management of active medical problems listed below. Physiatrist and rehab team continue to assess barriers to discharge/monitor patient progress toward functional and medical goals  Care Tool:  Bathing              Bathing assist  Upper Body Dressing/Undressing Upper body dressing        Upper body assist      Lower Body Dressing/Undressing Lower body dressing            Lower body assist       Toileting Toileting    Toileting assist       Transfers Chair/bed transfer  Transfers assist           Locomotion Ambulation   Ambulation assist              Walk 10 feet activity   Assist           Walk 50 feet activity   Assist           Walk 150 feet activity   Assist           Walk 10 feet on uneven surface  activity   Assist           Wheelchair     Assist               Wheelchair 50 feet with 2 turns activity    Assist            Wheelchair 150 feet activity     Assist          Blood pressure (!) (P) 144/76,  pulse (P) 79, temperature 98 F (36.7 C), resp. rate 17, height 5\' 7"  (1.702 m), weight 95.4 kg, SpO2 98%.    Medical Problem List and Plan: 1. Functional deficits secondary to bilateral cerebellar infarct, right more than left secondary large vessel disease from severe posterior circulation stenosis             -patient may shower             -ELOS/Goals: 10-12,PT/OT sup to mod I             -Grounds pass ordered 2.  Antithrombotics: -DVT/anticoagulation:  Pharmaceutical: Lovenox              -antiplatelet therapy: Aspirin  81 mg daily and Plavix  75 mg day x 3 months then Plavix  alone 3. Pain Management: Neurontin  300 mg twice daily 4. Mood/Behavior/Sleep: Provide emotional support             -antipsychotic agents: N/A 5. Neuropsych/cognition: This patient is capable of making decisions on his own behalf. 6. Skin/Wound Care: Routine skin checks 7. Fluids/Electrolytes/Nutrition: Routine in and outs with follow-up chemistries 8.  Klebsiella oxytoca UTI.  Completing course of Duricef 9.  CKD stage IV.  Cr reviewed and is worsening, discussed that torsemide  could be contributing, will consult nephrology  10.  Chronic combined systolic and diastolic congestive heart failure. EF 25-30% Monitor for any signs of fluid overload.  Follow-up cardiology services.  Continue Demadex  60 mg twice daily, weight reviewed and is increased, daily weights ordered  11.  Nonischemic cardiomyopathy.  Followed by cardiology service Dr. Micael Adas.  Continue Imdur  30 mg daily and Coreg  6.25 mg twice daily. 12.  History of prostate cancer, BPH.  Followed by Dr. Freddi Jaeger.  Flomax  0.4 mg twice daily             -Check PVR/bladder scan 13.  Diabetes mellitus with peripheral neuropathy and hemoglobin A1c 7.0.  Glucotrol  5 mg daily 14. HTN. Long term goal normotensive. Home meds carvedilol , imdur , toresemide 15. Obesity. Body mass index is 33.67 kg/m.             -dietary education 16. R  wrist pain due to fall. Wearing wrist  brace. Xray without fracture. Voltaren  gel scheduled             -tylenol  prn 17. Constipation. Miralax daily, senokot  LOS: 1 days A FACE TO FACE EVALUATION WAS PERFORMED  Aslee Such P Mihailo Sage 09/06/2023, 10:45 AM

## 2023-09-06 NOTE — Progress Notes (Signed)
 Inpatient Rehabilitation Care Coordinator Assessment and Plan Patient Details  Name: Allen Dyer MRN: [id number] Date of Birth: Jul 04, 1935  Today's Date: 09/06/2023  Hospital Problems: Principal Problem:   Ischemic cerebrovascular accident (CVA) [facility])  Past Medical History:  Past Medical History:  Diagnosis Date   Chronic combined systolic and diastolic CHF (congestive heart failure) (HCC)    CKD (chronic kidney disease), stage III (HCC)    Hyperlipidemia    Hypertension    Lower extremity edema    Mild CAD    a. mild-mod by cath 05/2018.   Multiple myeloma (HCC) 11/15/2018   NICM (nonischemic cardiomyopathy) (HCC)    Peripheral neuropathy    Prostate cancer (HCC)    Status post XRT   Rheumatic fever    Stroke (HCC)    Trifascicular block    Type 2 diabetes mellitus (HCC)    Vertigo    Past Surgical History:  Past Surgical History:  Procedure Laterality Date   RIGHT HEART CATH N/A 07/10/2018   Procedure: RIGHT HEART CATH;  Surgeon: Darlis Eisenmenger, MD;  Location: [facility] INVASIVE CV LAB;  Service: Cardiovascular;  Laterality: N/A;   RIGHT/LEFT HEART CATH AND CORONARY ANGIOGRAPHY N/A 06/18/2018   Procedure: RIGHT/LEFT HEART CATH AND CORONARY ANGIOGRAPHY;  Surgeon: Millicent Ally, MD;  Location: [facility] INVASIVE CV LAB;  Service: Cardiovascular;  Laterality: N/A;   Social History:  reports that he quit smoking about 52 years ago. His smoking use included cigarettes. He started smoking about 62 years ago. He has a 20 pack-year smoking history. He has never used smokeless tobacco. He reports that he does not currently use alcohol. He reports that he does not use drugs.  Family / Support Systems Marital Status: Widow/Widower How Long?: 2018 Patient Roles: Parent, Other (Comment) (grandfather) Children: Veronica-daughter [phone]  Has three other children who are supportive Other Supports: Church member Anticipated Caregiver: Daughter and son in-law Ability/Limitations of Caregiver:  Daughter works from home and can assist, son in-law can also once home from work Caregiver Availability: 24/7 Family Dynamics: Close knit with children and extended family. He feels he has good supports and hopes to be mod/i by DC  Social History Preferred language: English Religion: Non-Denominational Cultural Background: NA Education: HS Health Literacy - How often do you need to have someone help you when you read instructions, pamphlets, or other written material from your doctor or pharmacy?: Never Writes: Yes Employment Status: Retired Marine scientist Issues: No issues Guardian/Conservator: NA according to MD pt is capable of making his own decisions while here. Daughter plans to be here daily to provide support   Abuse/Neglect Abuse/Neglect Assessment Can Be Completed: Yes Physical Abuse: Denies Verbal Abuse: Denies Sexual Abuse: Denies Exploitation of patient/patient's resources: Denies Self-Neglect: Denies  Patient response to: Social Isolation - How often do you feel lonely or isolated from those around you?: Sometimes  Emotional Status Pt's affect, behavior and adjustment status: Pt is motivated to recover hehas had another stroke and did well and hopes to do so again. He does not want to burden his daughter. Daughter is present and very supportive. Recent Psychosocial Issues: other health issues Psychiatric History: No history/issues he is one to rely upon himself and to push himself. He may benefit from seeing neuro-psych while here Substance Abuse History: NA  Patient / Family Perceptions, Expectations & Goals Pt/Family understanding of illness & functional limitations: Pt and daughter can explain his stroke and deficits as result. He is hopeful he will do well here. Both  have spoken with the MD rounding and feel understand his plan moving forward. Premorbid pt/family roles/activities: dad, grandfather, retiree, church member, etc Anticipated changes in  roles/activities/participation: resume Pt/family expectations/goals: Pt states: " I hope to do well I will do my best in therapies."  Daughter states: " I know he will do his best and I know he wants to be independent."  [employer]: None Premorbid Home Care/DME Agencies: Other (Comment) (cane) Transportation available at discharge: self and now daughter Is the patient able to respond to transportation needs?: Yes In the past 12 months, has lack of transportation kept you from medical appointments or from getting medications?: No In the past 12 months, has lack of transportation kept you from meetings, work, or from getting things needed for daily living?: No Resource referrals recommended: Neuropsychology  Discharge Planning Living Arrangements: Children, Other relatives Support Systems: Children, Other relatives, Friends/neighbors, Church/faith community Type of Residence: Private residence Insurance Resources: Doctor, hospital) Financial Resources: Tree surgeon, Family Support Financial Screen Referred: No Living Expenses: Lives with family Money Management: Family, Patient Does the patient have any problems obtaining your medications?: No Home Management: family Patient/Family Preliminary Plans: Return home with daughter and son in-law whom he lives with. Daughter works from home and can assist if needed. Aware being evaluated today and goals being set. Care Coordinator Anticipated Follow Up Needs: HH/OP  Clinical Impression Pleasant gentleman who is motivated to do well and recover from this stroke. He has good supports via his children. Will await evaluations and work on discharge needs.  Mardell Shade 09/06/2023, 10:06 AM

## 2023-09-06 NOTE — Progress Notes (Signed)
 Contacted Dr. Nanette Baas regarding patient's high bladder volumes today and inability to void or cath. He will come by to place catheter. Nurse alerted to get urology cart from OR to beside.

## 2023-09-06 NOTE — Consult Note (Signed)
 Consultation: Urinary retention, urethral stricture Requested by: Dr. Laverle Postin   History of Present Illness: Allen Dyer is an 88 year old male patient of Dr. Freddi Jaeger.  He has had elevated PVRs and was not evaluated in the office.  Prostate was found to be about 26 g and cystoscopy revealed a 10 French bulbar urethral stricture.  Optilume balloon was planned but patient has not been well enough for the OR.  He has been in the hospital.  He has been getting diuresed.  He has been less mobile.  His PVRs have been escalating to where it was 800 cc tonight with a distended abdomen.  He is on antibiotics for a UTI.  No dysuria.  Past Medical History:  Diagnosis Date   Chronic combined systolic and diastolic CHF (congestive heart failure) (HCC)    CKD (chronic kidney disease), stage III (HCC)    Hyperlipidemia    Hypertension    Lower extremity edema    Mild CAD    a. mild-mod by cath 05/2018.   Multiple myeloma (HCC) 11/15/2018   NICM (nonischemic cardiomyopathy) (HCC)    Peripheral neuropathy    Prostate cancer (HCC)    Status post XRT   Rheumatic fever    Stroke [facility])    Trifascicular block    Type 2 diabetes mellitus (HCC)    Vertigo    Past Surgical History:  Procedure Laterality Date   RIGHT HEART CATH N/A 07/10/2018   Procedure: RIGHT HEART CATH;  Surgeon: Darlis Eisenmenger, MD;  Location: [facility] INVASIVE CV LAB;  Service: Cardiovascular;  Laterality: N/A;   RIGHT/LEFT HEART CATH AND CORONARY ANGIOGRAPHY N/A 06/18/2018   Procedure: RIGHT/LEFT HEART CATH AND CORONARY ANGIOGRAPHY;  Surgeon: Millicent Ally, MD;  Location: [facility] INVASIVE CV LAB;  Service: Cardiovascular;  Laterality: N/A;    Home Medications:  Medications Prior to Admission  Medication Sig Dispense Refill Last Dose/Taking   Ascorbic Acid (VITAMIN C) 1000 MG tablet Take 4,000 mg by mouth every morning.      aspirin  EC 81 MG tablet Take 1 tablet (81 mg total) by mouth daily. Swallow whole. 30 tablet 2    Blood Glucose  Monitoring Suppl (FREESTYLE LITE) w/Device KIT 1 Device by Does not apply route daily in the afternoon. 1 kit 0    carvedilol  (COREG ) 12.5 MG tablet Take 6.25 mg by mouth 2 (two) times daily with a meal.      cefadroxil (DURICEF) 500 MG capsule Take 1 capsule (500 mg total) by mouth 2 (two) times daily for 4 days. 8 capsule 0    Cholecalciferol (VITAMIN D3 PO) Take 1 capsule by mouth every morning.      clopidogrel  (PLAVIX ) 75 MG tablet Take 1 tablet (75 mg total) by mouth daily. 30 tablet 12    cyanocobalamin  1000 MCG tablet Take 1,000 mcg by mouth every morning.      gabapentin  (NEURONTIN ) 600 MG tablet Take 300 mg by mouth 2 (two) times daily.      Garlic 1000 MG CAPS Take 1,000 mg by mouth every morning.      glipiZIDE  (GLUCOTROL ) 5 MG tablet Take 5 mg by mouth daily before breakfast.      glucose blood (FREESTYLE LITE) test strip 1 each by Other route daily in the afternoon. Use as instructed 100 each 3    hydrALAZINE  (APRESOLINE ) 50 MG tablet Take 1 tablet (50 mg total) by mouth 3 (three) times daily. 6am, 2pm and 10pm (Patient taking differently: Take 50 mg by mouth 2 (  two) times daily. 6am, 2pm and 10pm)      isosorbide  mononitrate (IMDUR ) 30 MG 24 hr tablet Take 1 tablet (30 mg total) by mouth every morning.      meclizine  (ANTIVERT ) 12.5 MG tablet Take 2 tablets (25 mg total) by mouth 2 (two) times daily as needed for dizziness. 60 tablet 0    Omega-3 Fatty Acids (FISH OIL) 1200 MG CAPS Take 1,200 mg by mouth daily.      Oyster Shell Calcium  500 MG TABS Take 500 mg by mouth daily.      potassium chloride  SA (KLOR-CON  M) 20 MEQ tablet Take 2 tablets (40 mEq total) by mouth daily. (Patient taking differently: Take 20 mEq by mouth daily.) 180 tablet 3    tamsulosin  (FLOMAX ) 0.4 MG CAPS capsule Take 0.4 mg by mouth 2 (two) times daily.       torsemide  (DEMADEX ) 20 MG tablet Take 3 tablets (60 mg total) by mouth 2 (two) times daily. 180 tablet 5    Allergies:  Allergies  Allergen Reactions    Tramadol Other (See Comments)    Dizziness (intolerance) hallucinate   Finasteride Swelling    Breast enlargement    Jardiance  [Empagliflozin ]     Yeast infection   Lisinopril Cough   Ciprofloxacin Other (See Comments)    Dizziness (intolerance)    Simvastatin Hives and Other (See Comments)    Blisters/ Rash    Sulfa Antibiotics Rash    Family History  Problem Relation Age of Onset   Cancer Mother    Congestive Heart Failure Father        died from heart failure at the age of 5   Cancer Sister    Congestive Heart Failure Brother        died from heart failure at the age of 74   Stroke Neg Hx    Social History:  reports that he quit smoking about 52 years ago. His smoking use included cigarettes. He started smoking about 62 years ago. He has a 20 pack-year smoking history. He has never used smokeless tobacco. He reports that he does not currently use alcohol. He reports that he does not use drugs.  ROS: A complete review of systems was performed.  All systems are negative except for pertinent findings as noted. Review of Systems  All other systems reviewed and are negative.    Physical Exam:  Vital signs in last 24 hours: Temp:  [98 F (36.7 C)-98.2 F (36.8 C)] 98.2 F (36.8 C) (05/15 1759) Pulse Rate:  [63-79] 75 (05/15 1759) Resp:  [17-18] 18 (05/15 1759) BP: (118-146)/(61-81) 146/81 (05/15 1759) SpO2:  [97 %-98 %] 97 % (05/15 1759) FiO2 (%):  [21 %] 21 % (05/14 2345) Weight:  [95.4 kg] 95.4 kg (05/15 0329) General:  Alert and oriented, No acute distress HEENT: Normocephalic, atraumatic Neck: No JVD or lymphadenopathy Cardiovascular: Regular rate and rhythm Lungs: Regular rate and effort Abdomen: Soft, nontender, nondistended, no abdominal masses, suprapubic area full.  This resolved after catheter placement. Back: No CVA tenderness Extremities: No edema Neurologic: Grossly intact GU: Penis circumcised and without mass or lesion.  The glans, meatus and  scrotum all appeared normal.  Procedure: I discussed with Allen Dyer instead of a blind catheter attempt, I recommended cystoscopy.  The scope is around 49 [place] which is similar to a Foley catheter but it would be more accurate to direct the wire through the stricture and tried to dilate it.  He elected to proceed.  He was prepped and draped in the usual sterile fashion and the flexible cystoscope was passed per urethra where the bulb stricture was noted.  It appeared short but the scope would not pass.  Therefore I advanced a sensor wire under direct vision and coiled that in the bladder.  The wire advanced without any resistance.  I then tried to pass a 12 [place] dilator but the stricture was too tight.  I went down to 10 which passed and then up to 16 [place].  Urine return was seen with each pass.  He began to void around the wire.  I then passed a 16 [place] council tip catheter into the bladder remove the wire and connected it to the bag.  The balloon was inflated and then the balloon was seated at the bladder neck.  About another 800 cc was drained.  Urine was clear.  Laboratory Data:  Results for orders placed or performed during the hospital encounter of 09/05/23 (from the past 24 hours)  Comprehensive metabolic panel     Status: Abnormal   Collection Time: 09/06/23  5:42 AM  Result Value Ref Range   Sodium 136 135 - 145 mmol/L   Potassium 4.0 3.5 - 5.1 mmol/L   Chloride 100 98 - 111 mmol/L   CO2 24 22 - 32 mmol/L   Glucose, Bld 147 (H) 70 - 99 mg/dL   BUN 49 (H) 8 - 23 mg/dL   Creatinine, Ser 4.09 (H) 0.61 - 1.24 mg/dL   Calcium  8.9 8.9 - 10.3 mg/dL   Total Protein 6.7 6.5 - 8.1 g/dL   Albumin  2.5 (L) 3.5 - 5.0 g/dL   AST 12 (L) 15 - 41 U/L   ALT 9 0 - 44 U/L   Alkaline Phosphatase 46 38 - 126 U/L   Total Bilirubin 0.4 0.0 - 1.2 mg/dL   GFR, Estimated 21 (L) >60 mL/min   Anion gap 12 5 - 15  CBC with Differential/Platelet     Status: Abnormal   Collection Time: 09/06/23  5:42 AM   Result Value Ref Range   WBC 6.7 4.0 - 10.5 K/uL   RBC 3.28 (L) 4.22 - 5.81 MIL/uL   Hemoglobin 10.7 (L) 13.0 - 17.0 g/dL   HCT 81.1 (L) 91.4 - 78.2 %   MCV 100.3 (H) 80.0 - 100.0 fL   MCH 32.6 26.0 - 34.0 pg   MCHC 32.5 30.0 - 36.0 g/dL   RDW 95.6 21.3 - 08.6 %   Platelets 174 150 - 400 K/uL   nRBC 0.0 0.0 - 0.2 %   Neutrophils Relative % 72 %   Neutro Abs 4.9 1.7 - 7.7 K/uL   Lymphocytes Relative 13 %   Lymphs Abs 0.9 0.7 - 4.0 K/uL   Monocytes Relative 14 %   Monocytes Absolute 0.9 0.1 - 1.0 K/uL   Eosinophils Relative 1 %   Eosinophils Absolute 0.0 0.0 - 0.5 K/uL   Basophils Relative 0 %   Basophils Absolute 0.0 0.0 - 0.1 K/uL   Immature Granulocytes 0 %   Abs Immature Granulocytes 0.02 0.00 - 0.07 K/uL  Glucose, capillary     Status: Abnormal   Collection Time: 09/06/23  6:10 AM  Result Value Ref Range   Glucose-Capillary 151 (H) 70 - 99 mg/dL  Glucose, capillary     Status: Abnormal   Collection Time: 09/06/23 11:26 AM  Result Value Ref Range   Glucose-Capillary 220 (H) 70 - 99 mg/dL  Glucose, capillary  Status: Abnormal   Collection Time: 09/06/23  4:03 PM  Result Value Ref Range   Glucose-Capillary 217 (H) 70 - 99 mg/dL  Glucose, capillary     Status: Abnormal   Collection Time: 09/06/23  8:55 PM  Result Value Ref Range   Glucose-Capillary 234 (H) 70 - 99 mg/dL   Recent Results (from the past 240 hours)  Urine Culture     Status: Abnormal   Collection Time: 09/02/23  4:57 PM   Specimen: Urine, Clean Catch  Result Value Ref Range Status   Specimen Description   Final    URINE, CLEAN CATCH Performed at [facility], [address] [place], [place] [address]    Special Requests   Final    NONE Performed at [facility], [address], [place], [place] [address]    Culture >=100,000 COLONIES/mL KLEBSIELLA OXYTOCA (A)  Final   Report Status 09/04/2023 FINAL  Final   Organism ID, Bacteria KLEBSIELLA OXYTOCA (A)   Final      Susceptibility   Klebsiella oxytoca - MIC*    AMPICILLIN >=32 RESISTANT Resistant     CEFEPIME <=0.12 SENSITIVE Sensitive     CEFTRIAXONE <=0.25 SENSITIVE Sensitive     CIPROFLOXACIN <=0.25 SENSITIVE Sensitive     GENTAMICIN <=1 SENSITIVE Sensitive     IMIPENEM <=0.25 SENSITIVE Sensitive     NITROFURANTOIN <=16 SENSITIVE Sensitive     TRIMETH/SULFA <=20 SENSITIVE Sensitive     AMPICILLIN/SULBACTAM 8 SENSITIVE Sensitive     PIP/TAZO <=4 SENSITIVE Sensitive ug/mL    * >=100,000 COLONIES/mL KLEBSIELLA OXYTOCA   Creatinine: Recent Labs    09/02/23 1642 09/03/23 0715 09/04/23 0558 09/05/23 2114 09/06/23 0542  CREATININE 2.28* 2.28* 2.71* 2.79* 2.80*    Impression/Assessment:  Urinary retention, urethral stricture-  Plan:  Recommend leaving Foley catheter in place for at least 5 days to allow the bladder stretch injury to recover as well as the urethra to heal.  Once antibiotic course is complete, convert him to 1 cefadroxil p.o. nightly which will significantly decrease his risk of CAUTI.  Will follow peripherally.  I will notify Dr. Freddi Jaeger of catheter placement.  Please page GU on call anytime with questions, concerns or changes in patient's status.  Christina Coyer 09/06/2023, 11:21 PM

## 2023-09-06 NOTE — Progress Notes (Addendum)
 Inpatient Rehabilitation Admission Medication Review by a Pharmacist  A complete drug regimen review was completed for this patient to identify any potential clinically significant medication issues.  High Risk Drug Classes Is patient taking? Indication by Medication  Antipsychotic No   Anticoagulant Yes Lovenox  - VTE prophylaxis  Antibiotic Yes Cefadroxil  thru 09/08/23 - Klebsiella oxytoca UTI   Opioid No   Antiplatelet Yes Aspirin , plavix  x 3 months then Plavix  alone (ASA end date 11/25/23) - acute  CVA, CVA prophylaxis, CAD  Hypoglycemics/insulin  Yes Insulin  aspart SSI, glipzide -DM  Vasoactive Medication Yes Coreg , Imdur  - Nonischemic cardiomyopathy.  Ttorsemide - HF  Chemotherapy No   Other Yes Acetaminophen , diclofenac  gel - pain Flomax - h/o prostate cancer, BPH Gabapentin - neuropathic pain Meclizine  - prn dizziness Klor-con : potassium  Supplement Vitamin D - supplement     Type of Medication Issue Identified Description of Issue Recommendation(s)  Drug Interaction(s) (clinically significant)     Duplicate Therapy     Allergy     No Medication Administration End Date  Aspirin , plavix  x 3 months then Plavix  alone (ASA end date 11/25/23)  Communicate to patient /family/ caregiver prior to discharge.   Incorrect Dose     Additional Drug Therapy Needed     Significant med changes from prior encounter (inform family/care partners about these prior to discharge). PTA medication:  Hydralazine  continued on [facility] acute discharge orders, however not resumed on CIR.  Restart PTA meds when and if necessary during CIR admission or at time of discharge, if warranted   Other       Clinically significant medication issues were identified that warrant physician communication and completion of prescribed/recommended actions by midnight of the next day:  No  Name of provider notified for urgent issues identified:   Provider Method of Notification:    Pharmacist comments:   Time spent  performing this drug regimen review (minutes):  25    Alisa Irish, RPh Clinical Pharmacist 09/06/2023 4:12 PM

## 2023-09-06 NOTE — Plan of Care (Signed)
 Problem: RH Balance Goal: LTG: Patient will maintain dynamic sitting balance (OT) Description: LTG:  Patient will maintain dynamic sitting balance with assistance during activities of daily living (OT) Flowsheets (Taken 09/06/2023 1503) LTG: Pt will maintain dynamic sitting balance during ADLs with: Independent with assistive device Goal: LTG Patient will maintain dynamic standing with ADLs (OT) Description: LTG:  Patient will maintain dynamic standing balance with assist during activities of daily living (OT)  Flowsheets (Taken 09/06/2023 1503) LTG: Pt will maintain dynamic standing balance during ADLs with: Contact Guard/Touching assist   Problem: Sit to Stand Goal: LTG:  Patient will perform sit to stand in prep for activites of daily living with assistance level (OT) Description: LTG:  Patient will perform sit to stand in prep for activites of daily living with assistance level (OT) Flowsheets (Taken 09/06/2023 1503) LTG: PT will perform sit to stand in prep for activites of daily living with assistance level: Contact Guard/Touching assist   Problem: RH Eating Goal: LTG Patient will perform eating w/assist, cues/equip (OT) Description: LTG: Patient will perform eating with assist, with/without cues using equipment (OT) Flowsheets (Taken 09/06/2023 1503) LTG: Pt will perform eating with assistance level of: Independent   Problem: RH Grooming Goal: LTG Patient will perform grooming w/assist,cues/equip (OT) Description: LTG: Patient will perform grooming with assist, with/without cues using equipment (OT) Flowsheets (Taken 09/06/2023 1503) LTG: Pt will perform grooming with assistance level of: Independent with assistive device    Problem: RH Bathing Goal: LTG Patient will bathe all body parts with assist levels (OT) Description: LTG: Patient will bathe all body parts with assist levels (OT) Flowsheets (Taken 09/06/2023 1503) LTG: Pt will perform bathing with assistance level/cueing:  Minimal Assistance - Patient > 75% LTG: Position pt will perform bathing:  Shower  At sink   Problem: RH Dressing Goal: LTG Patient will perform upper body dressing (OT) Description: LTG Patient will perform upper body dressing with assist, with/without cues (OT). Flowsheets (Taken 09/06/2023 1503) LTG: Pt will perform upper body dressing with assistance level of: Set up assist Goal: LTG Patient will perform lower body dressing w/assist (OT) Description: LTG: Patient will perform lower body dressing with assist, with/without cues in positioning using equipment (OT) Flowsheets (Taken 09/06/2023 1503) LTG: Pt will perform lower body dressing with assistance level of: Minimal Assistance - Patient > 75%   Problem: RH Toileting Goal: LTG Patient will perform toileting task (3/3 steps) with assistance level (OT) Description: LTG: Patient will perform toileting task (3/3 steps) with assistance level (OT)  Flowsheets (Taken 09/06/2023 1503) LTG: Pt will perform toileting task (3/3 steps) with assistance level: Minimal Assistance - Patient > 75%   Problem: RH Toilet Transfers Goal: LTG Patient will perform toilet transfers w/assist (OT) Description: LTG: Patient will perform toilet transfers with assist, with/without cues using equipment (OT) Flowsheets (Taken 09/06/2023 1503) LTG: Pt will perform toilet transfers with assistance level of: Contact Guard/Touching assist   Problem: RH Tub/Shower Transfers Goal: LTG Patient will perform tub/shower transfers w/assist (OT) Description: LTG: Patient will perform tub/shower transfers with assist, with/without cues using equipment (OT) Flowsheets (Taken 09/06/2023 1503) LTG: Pt will perform tub/shower stall transfers with assistance level of: Contact Guard/Touching assist   Problem: RH Attention Goal: LTG Patient will demonstrate this level of attention during functional activites (OT) Description: LTG:  Patient will demonstrate this level of attention  during functional activites  (OT) Flowsheets (Taken 09/06/2023 1503) Patient will demonstrate this level of attention during functional activites: Selective Patient will demonstrate above attention level  in the following environment: Home LTG: Patient will demonstrate this level of attention during functional activites (OT): Minimal Assistance - Patient > 75%   Problem: RH Awareness Goal: LTG: Patient will demonstrate awareness during functional activites type of (OT) Description: LTG: Patient will demonstrate awareness during functional activites type of (OT) Flowsheets (Taken 09/06/2023 1503) Patient will demonstrate awareness during functional activites type of: Anticipatory LTG: Patient will demonstrate awareness during functional activites type of (OT): Minimal Assistance - Patient > 75%

## 2023-09-06 NOTE — Progress Notes (Addendum)
 Occupational Therapy Session Note  Patient Details  Name: Allen Dyer MRN: [id number] Date of Birth: May 01, 1935  Today's Date: 09/06/2023 OT Individual Time: 1400-1450 OT Individual Time Calculation (min): 50 min    Short Term Goals: Week 1:  OT Short Term Goal 1 (Week 1): Patient will transfer to shower with min assist OT Short Term Goal 2 (Week 1): Patient will transfer to toilet with min assist OT Short Term Goal 3 (Week 1): Patient will dress upper body with set up assist OT Short Term Goal 4 (Week 1): Patient will dress lower body with mod assist OT Short Term Goal 5 (Week 1): Patient will report pain in right wrist reduced to 4/10 following gentle range/ mobilization  Skilled Therapeutic Interventions/Progress Updates:  Patient received seated in wheelchair with pants down and towel over lap, bladder scanner in room.  Patient reports he is not happy because he is unable to void.  He indicates he was working with urology prior to this admission because of a blockage.  Spoke with nursing to ensure we could go ahead with OT session.  Patient transitioned sit to stand with mod assist  and two attempts - has strong posterior bias.  Patient stood long enough to manage pants and brief.  Patient transferred to bed with mod assist initially then min assist.  Patient transitioned to supine with mod assist guarding right arm.   Removed wrist brace and performed edema massage to right hand/wrist/forearm.  Gentle traction and gentle mobility in wrist and forearm.  Patient with more pronounced pain on distal wrist - mid wrist and metacarpal (3rd) Patient responded well to massage and gentle passive motion, followed with gentle active motion.  Nurse applied topical cream for pain, brace reapplied and ice pack applied to right wrist - dorsum.   Left in bed with alarm engaged and call bell/ personal items in reach.    Therapy Documentation Precautions:  Precautions Precautions: Fall, Other (comment)  (DNR) Precaution/Restrictions Comments: Urinary incontinence, DNR Required Braces or Orthoses: Splint/Cast Splint/Cast: R wrist cock up splint Restrictions Weight Bearing Restrictions Per Provider Order: No Other Position/Activity Restrictions: as tolerates Pain: Pain Assessment Pain Scale: 0-10 Pain Score: 6  Pain Type: Acute pain Pain Location: Wrist Pain Orientation: Right Pain Descriptors / Indicators: Aching Pain Onset: On-going Patients Stated Pain Goal: 0 Pain Intervention(s): Medication (See eMAR);Repositioned Multiple Pain Sites: No     Therapy/Group: Individual Therapy  [name] M 09/06/2023, 3:07 PM

## 2023-09-06 NOTE — Progress Notes (Signed)
 Inpatient Rehabilitation Center Individual Statement of Services  Patient Name:  Allen Dyer  Date:  09/06/2023  Welcome to the Inpatient Rehabilitation Center.  Our goal is to provide you with an individualized program based on your diagnosis and situation, designed to meet your specific needs.  With this comprehensive rehabilitation program, you will be expected to participate in at least 3 hours of rehabilitation therapies Monday-Friday, with modified therapy programming on the weekends.  Your rehabilitation program will include the following services:  Physical Therapy (PT), Occupational Therapy (OT), 24 hour per day rehabilitation nursing, Therapeutic Recreaction (TR), Neuropsychology, Care Coordinator, Rehabilitation Medicine, Nutrition Services, and Pharmacy Services  Weekly team conferences will be held on Wednesday to discuss your progress.  Your Inpatient Rehabilitation Care Coordinator will talk with you frequently to get your input and to update you on team discussions.  Team conferences with you and your family in attendance may also be held.  Expected length of stay: 12-14 days  Overall anticipated outcome: supervision-CGA level  Depending on your progress and recovery, your program may change. Your Inpatient Rehabilitation Care Coordinator will coordinate services and will keep you informed of any changes. Your Inpatient Rehabilitation Care Coordinator's name and contact numbers are listed  below.  The following services may also be recommended but are not provided by the Inpatient Rehabilitation Center:  Driving Evaluations Home Health Rehabiltiation Services Outpatient Rehabilitation Services    Arrangements will be made to provide these services after discharge if needed.  Arrangements include referral to agencies that provide these services.  Your insurance has been verified to be:  Medicare & Tricare Your primary doctor is:  Dibas Koirala  Pertinent information will be  shared with your doctor and your insurance company.  Inpatient Rehabilitation Care Coordinator:  Adrianna Albee, Buzz Cass [phone] or Justine Oms  Information discussed with and copy given to patient by: Mardell Shade, 09/06/2023, 10:09 AM

## 2023-09-06 NOTE — Evaluation (Signed)
 Physical Therapy Assessment and Plan  Patient Details  Name: Allen Dyer MRN: [id number] Date of Birth: 02-03-1936  PT Diagnosis: Abnormal posture, Abnormality of gait, Cognitive deficits, Difficulty walking, Hemiplegia dominant, Muscle weakness, and Pain in R wrist Rehab Potential: Good ELOS: 10-14 days   Today's Date: 09/06/2023 PT Individual Time: 2536-6440 PT Individual Time Calculation (min): 72 min    Hospital Problem: Principal Problem:   Ischemic cerebrovascular accident (CVA) (HCC)   Past Medical History:  Past Medical History:  Diagnosis Date   Chronic combined systolic and diastolic CHF (congestive heart failure) (HCC)    CKD (chronic kidney disease), stage III (HCC)    Hyperlipidemia    Hypertension    Lower extremity edema    Mild CAD    a. mild-mod by cath 05/2018.   Multiple myeloma (HCC) 11/15/2018   NICM (nonischemic cardiomyopathy) (HCC)    Peripheral neuropathy    Prostate cancer (HCC)    Status post XRT   Rheumatic fever    Stroke (HCC)    Trifascicular block    Type 2 diabetes mellitus (HCC)    Vertigo    Past Surgical History:  Past Surgical History:  Procedure Laterality Date   RIGHT HEART CATH N/A 07/10/2018   Procedure: RIGHT HEART CATH;  Surgeon: Darlis Eisenmenger, MD;  Location: [facility] INVASIVE CV LAB;  Service: Cardiovascular;  Laterality: N/A;   RIGHT/LEFT HEART CATH AND CORONARY ANGIOGRAPHY N/A 06/18/2018   Procedure: RIGHT/LEFT HEART CATH AND CORONARY ANGIOGRAPHY;  Surgeon: Millicent Ally, MD;  Location: [facility] INVASIVE CV LAB;  Service: Cardiovascular;  Laterality: N/A;    Assessment & Plan Clinical Impression: Patient is a 88 year old right-handed male with history significant for chronic combined systolic and diastolic heart failure, nonischemic cardiomyopathy followed by Dr. Micael Adas as well as Dr.McLean, CVA, prostate cancer status post XRT/BPH followed by Dr. Doy Gene, GERD, gout, multiple myeloma followed by Dr. Marton Sleeper, nonobstructive CAD,  hyperlipidemia, hypertension, diabetes mellitus with peripheral neuropathy and CKD stage IV. Per chart review patient lives with his daughter. Patient is able to live on the main level with bedroom and bath. Reportedly independent prior to admission. Presented 09/02/2023 with progressive unsteady gait times several weeks as well as falls x 2. MRI showed patchy early subacute ischemic infarcts involving the right greater than left cerebellar hemispheres. Associated minimal petechial blood products of the right cerebellum without hemorrhagic transformation. Pt reports injury to R wrist after fall. X-rays of the right wrist showed no fracture. 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 Irregular reconstitution of the distal V2 segment of the right vertebral artery with occlusion of the right V3 and V4 segments. Left vertebral artery occluded at the V2 segment from the level of C4-5. CT cervical spine showed hypodense lesions in the cerebellar hemispheres, measuring 3.4 x 3.2 cm on the right 0.8 cm and 1.8 x 1.1 cm on the left. No fracture or subluxation. Admission chemistries unremarkable aside glucose 171 BUN 38 creatinine 2.28, urine culture greater than 100,000 Klebsiella oxytoca, hemoglobin A1c 7.0. Echocardiogram with ejection fraction of 25 to 30% grade 1 diastolic dysfunction left ventricle demonstrating global hypokinesis. Neurology follow-up placed on aspirin  as well as Plavix  for CVA prophylaxis x 3 months then Plavix  alone. Lovenox  added for DVT prophylaxis. Reports chronic bladder urgency. Patient received 2 days of Rocephin for UTI transitioned to Duricef. Tolerating a regular consistency diet. Therapy evaluations completed due to patient's decreased functional mobility was admitted for a comprehensive rehab program. Patient transferred to CIR on 09/05/2023 .  Patient currently requires mod with mobility secondary to muscle weakness and muscle joint tightness, decreased cardiorespiratoy  endurance, decreased initiation, decreased attention, decreased awareness, decreased memory, and delayed processing, and decreased standing balance, decreased postural control, hemiplegia, and decreased balance strategies.  Prior to hospitalization, patient was supervision with mobility and lived with Daughter, Family in a House home.  Home access is 5 steps front entrance, 2 steps garage entranceStairs to enter.  Patient will benefit from skilled PT intervention to maximize safe functional mobility, minimize fall risk, and decrease caregiver burden for planned discharge home with 24 hour assist.  Anticipate patient will benefit from follow up [facility] at discharge.  PT - End of Session Activity Tolerance: Tolerates < 10 min activity, no significant change in vital signs Endurance Deficit: Yes PT Assessment Rehab Potential (ACUTE/IP ONLY): Good PT Barriers to Discharge: Decreased caregiver support;Home environment access/layout;Incontinence;Insurance for SNF coverage;Weight PT Patient demonstrates impairments in the following area(s): Balance;Edema;Motor;Endurance;Pain;Safety PT Transfers Functional Problem(s): Bed Mobility;Bed to Chair;Car PT Locomotion Functional Problem(s): Ambulation;Wheelchair Mobility;Stairs PT Plan PT Intensity: Minimum of 1-2 x/day ,45 to 90 minutes PT Frequency: 5 out of 7 days PT Duration Estimated Length of Stay: 10-14 days PT Treatment/Interventions: Ambulation/gait training;Balance/vestibular training;Cognitive remediation/compensation;Community reintegration;Discharge planning;Disease management/prevention;DME/adaptive equipment instruction;Functional electrical stimulation;Functional mobility training;Neuromuscular re-education;Pain management;Patient/family education;Psychosocial support;Skin care/wound management;Splinting/orthotics;Therapeutic Activities;Stair training;UE/LE Strength taining/ROM;Therapeutic Exercise;UE/LE Coordination activities PT Transfers Anticipated  Outcome(s): supervision PT Locomotion Anticipated Outcome(s): CGA PT Recommendation Recommendations for Other Services: Neuropsych consult Follow Up Recommendations: Home health PT;Outpatient PT Patient destination: Home Equipment Recommended: To be determined   PT Evaluation Precautions/Restrictions Precautions Precautions: Fall Precaution/Restrictions Comments: Urinary incontinence, urinary incontinence Required Braces or Orthoses: Splint/Cast Splint/Cast: R wrist cock up splint Restrictions Weight Bearing Restrictions Per Provider Order: No Pain: Pain Assessment Pain Scale: 0-10 Pain Score: 4  Pain Type: Acute pain Pain Location: Wrist Pain Orientation: Right Pain Descriptors / Indicators: Aching Pain Intervention(s): Medication (See eMAR) (voltaren ) Pain Interference Pain Interference Pain Effect on Sleep: 2. Occasionally Pain Interference with Therapy Activities: 2. Occasionally Pain Interference with Day-to-Day Activities: 2. Occasionally Home Living/Prior Functioning Home Living Living Arrangements: Children;Other relatives Available Help at Discharge: Family;Available 24 hours/day Type of Home: House Home Access: Stairs to enter [employer] of Steps: 5 steps front entrance, 2 steps garage entrance Entrance Stairs-Rails: Can reach both Home Layout: Two level;Able to live on main level with bedroom/bathroom;Bed/bath upstairs Alternate Level Stairs-Number of Steps: 10 Alternate Level Stairs-Rails: Left Bathroom Shower/Tub: Tub/shower unit Bathroom Toilet: Handicapped height Bathroom Accessibility: Yes Additional Comments: lives with daughter and family  Lives With: Daughter;Family Prior Function Level of Independence: Independent with basic ADLs;Independent with transfers;Independent with gait  Able to Take Stairs?: Yes Driving: No Vocation: Retired Optometrist - History Ability to See in Adequate Light: 0 Adequate Vision -  Assessment Convergence: Impaired - to be further tested in functional context Perception Perception: Within Functional Limits Praxis Praxis: WFL  Cognition Overall Cognitive Status: Impaired/Different from baseline Arousal/Alertness: Awake/alert (flat affect) Attention: Focused;Sustained;Selective Focused Attention: Appears intact Sustained Attention: Appears intact Selective Attention: Impaired Selective Attention Impairment: Verbal basic;Functional basic Memory: Impaired Memory Impairment: Retrieval deficit Awareness: Appears intact Problem Solving: Appears intact Safety/Judgment: Appears intact Sensation Sensation Light Touch: Appears Intact Hot/Cold: Appears Intact Proprioception: Impaired by gross assessment Stereognosis: Not tested Coordination Gross Motor Movements are Fluid and Coordinated: No Coordination and Movement Description: generalized weakness, stiffness, and R wrist pain limiting Motor  Motor Motor: Hemiplegia;Motor apraxia Motor - Skilled Clinical Observations: mild R hemi   Trunk/Postural Assessment  Cervical Assessment Cervical Assessment: Exceptions to [facility] (limited rotation, rigid, forward head) Thoracic Assessment Thoracic Assessment: Exceptions to [facility] (thoracic rounding) Lumbar Assessment Lumbar Assessment: Exceptions to [facility] (posterior tilt) Postural Control Postural Control: Deficits on evaluation Trunk Control: posterior bias  Balance Balance Balance Assessed: Yes Static Sitting Balance Static Sitting - Level of Assistance: 5: Stand by assistance Dynamic Sitting Balance Dynamic Sitting - Level of Assistance: 4: Min assist Static Standing Balance Static Standing - Level of Assistance: 3: Mod assist Dynamic Standing Balance Dynamic Standing - Level of Assistance: 3: Mod assist;2: Max assist Extremity Assessment      RLE Assessment RLE Assessment: Exceptions to [facility] General Strength Comments: Grossly 3+/5 LLE Assessment LLE  Assessment: Exceptions to [facility] General Strength Comments: Grossly 4-/5  Care Tool Care Tool Bed Mobility Roll left and right activity   Roll left and right assist level: Moderate Assistance - Patient 50 - 74%    Sit to lying activity   Sit to lying assist level: Moderate Assistance - Patient 50 - 74%    Lying to sitting on side of bed activity   Lying to sitting on side of bed assist level: the ability to move from lying on the back to sitting on the side of the bed with no back support.: Moderate Assistance - Patient 50 - 74%     Care Tool Transfers Sit to stand transfer   Sit to stand assist level: Moderate Assistance - Patient 50 - 74%    Chair/bed transfer   Chair/bed transfer assist level: Moderate Assistance - Patient 50 - 74%    Car transfer Car transfer activity did not occur: Safety/medical concerns        Care Tool Locomotion Ambulation   Assist level: Moderate Assistance - Patient 50 - 74% Assistive device: Hand held assist Max distance: 24ft  Walk 10 feet activity   Assist level: Moderate Assistance - Patient - 50 - 74% Assistive device: Hand held assist   Walk 50 feet with 2 turns activity   Assist level: Minimal Assistance - Patient > 75% Assistive device: Walker-Eva  Walk 150 feet activity   Assist level: Minimal Assistance - Patient > 75% Assistive device: Walker-Eva  Walk 10 feet on uneven surfaces activity Walk 10 feet on uneven surfaces activity did not occur: Safety/medical concerns      Stairs   Assist level: 2 helpers Stairs assistive device: 2 hand rails Max number of stairs: 4  Walk up/down 1 step activity   Walk up/down 1 step (curb) assist level: 2 helpers Walk up/down 1 step or curb assistive device: 2 hand rails  Walk up/down 4 steps activity   Walk up/down 4 steps assist level: 2 helpers Walk up/down 4 steps assistive device: 2 hand rails  Walk up/down 12 steps activity Walk up/down 12 steps activity did not occur: Safety/medical  concerns      Pick up small objects from floor Pick up small object from the floor (from standing position) activity did not occur: Safety/medical concerns      Wheelchair Is the patient using a wheelchair?: Yes Type of Wheelchair: Manual   Wheelchair assist level: Dependent - Patient 0%    Wheel 50 feet with 2 turns activity   Assist Level: Dependent - Patient 0%  Wheel 150 feet activity   Assist Level: Dependent - Patient 0%    Refer to Care Plan for Long Term Goals  SHORT TERM GOAL WEEK 1 PT Short Term Goal 1 (Week 1): Pt will complete  bed mobility with minA PT Short Term Goal 2 (Week 1): Pt will complete bed<>chair transfers with minA PT Short Term Goal 3 (Week 1): pt will ambulate 159ft with minA and LRAD PT Short Term Goal 4 (Week 1): Pt will navigate up/down x4 stairs with modA  Recommendations for other services: Neuropsych  Skilled Therapeutic Intervention Mobility Bed Mobility Bed Mobility: Sit to Supine;Supine to Sit Supine to Sit: Moderate Assistance - Patient 50-74% Sit to Supine: Moderate Assistance - Patient 50-74% Transfers Transfers: Sit to Stand;Stand to Sit;Stand Pivot Transfers Sit to Stand: Moderate Assistance - Patient 50-74% Stand to Sit: Moderate Assistance - Patient 50-74% Stand Pivot Transfers: Moderate Assistance - Patient 50 - 74% Stand Pivot Transfer Details: Tactile cues for initiation;Verbal cues for sequencing;Visual cues/gestures for sequencing;Verbal cues for technique;Verbal cues for precautions/safety;Tactile cues for posture;Manual facilitation for weight shifting;Verbal cues for gait pattern;Verbal cues for safe use of DME/AE Transfer (Assistive device): 1 person hand held assist Locomotion  Gait Ambulation: Yes Gait Assistance: Moderate Assistance - Patient 50-74% Gait Distance (Feet): 25 Feet Assistive device: 1 person hand held assist Gait Assistance Details: Tactile cues for initiation;Tactile cues for weight shifting;Verbal cues  for gait pattern;Verbal cues for technique;Verbal cues for precautions/safety;Verbal cues for safe use of DME/AE;Verbal cues for sequencing;Tactile cues for posture;Manual facilitation for weight shifting Gait Gait: Yes Gait Pattern: Impaired Gait Pattern: Step-to pattern;Decreased stride length;Decreased dorsiflexion - left;Decreased dorsiflexion - right;Poor foot clearance - left;Poor foot clearance - right;Trunk flexed Stairs / Additional Locomotion Stairs: Yes Stairs Assistance: 2 Helpers;Moderate Assistance - Patient 50 - 74% Stair Management Technique: Two rails;Step to pattern;Forwards Number of Stairs: 4 Height of Stairs: 6 Wheelchair Mobility Wheelchair Mobility: No  Treatment: Pt presents in wheelchair with daughter at bedside. Daughter assisted with providing all PLOF and social factors - confirms supervision/independent baseline mobility without the use of an AD, does not drive, and has a history of a few falls within the past year. Patient pleasant and cooperative throughout session, endorses some dizziness and "spinning" sensations since his CVA. Will possibly require vestibular evaluation. Functional mobility completed as outlined above. Requires modA with HHA for functional gait up to ~78ft. With the use of an EVA walker, he's able to ambulate >152ft at minA level. He required +2 modA for navigating x4 stairs with 2 hand rails. Patient exhibits fear of falling, posterior bias in standing, impaired balance strategies, global weakness/stiffness and deconditioning, and limited activity tolerance. Session concluded with patient sitting up in wheelchair with chair pad alarm on, call bell within reach.   Instructed pt in results of PT evaluation as detailed above, PT POC, rehab potential, rehab goals, and discharge recommendations. Additionally discussed CIR's policies regarding fall safety and use of chair alarm and/or quick release belt. Pt verbalized understanding and in agreement. Will  update pt's family members as they become available.    Discharge Criteria: Patient will be discharged from PT if patient refuses treatment 3 consecutive times without medical reason, if treatment goals not met, if there is a change in medical status, if patient makes no progress towards goals or if patient is discharged from hospital.  The above assessment, treatment plan, treatment alternatives and goals were discussed and mutually agreed upon: by patient and by family  Pheobe Brass 09/06/2023, 12:38 PM

## 2023-09-06 NOTE — Progress Notes (Signed)
 Pt bladder scanned with on scan. Autumn RN performed coude straight cath with difficulty, voided. Notified Everlyn Hockey Angiulli, PA-C and he said to encourage patient to attempt to void the way that he does at home, which patient says he normally has difficulty, said he "stands for a long time, and squeezes his penis, and when his bladder is full it comes out." Instructed to bladder scan again in a few hours. Due to patient having a UTI and prostate issues, PA would like to avoid foley if possible.

## 2023-09-06 NOTE — Progress Notes (Signed)
 RN Baby Lessen was concerned about pts bladder scan amount of >800 mL.  This RN and NT Cherise got pt up standing at bedside so he could attempt to urinate in the urinal.  Pt has routine he does at home which he was encouraged to try.  Standing at bedside for about 5 minutes with a result of 100 mL urinated and large BM.  Pt feels like he can not urinate anymore.  Pt repositioned back in bed and bladder scanned with a result of 814 mL. Pt states he is in no pain and does not feel urge to urinate.  On call Jean Michaelis PA called.  On call PA to call Urologist.  No orders received at this time.

## 2023-09-06 NOTE — Plan of Care (Signed)
  Problem: RH Balance Goal: LTG Patient will maintain dynamic sitting balance (PT) Description: LTG:  Patient will maintain dynamic sitting balance with assistance during mobility activities (PT) Flowsheets (Taken 09/06/2023 1113) LTG: Pt will maintain dynamic sitting balance during mobility activities with:: Supervision/Verbal cueing Goal: LTG Patient will maintain dynamic standing balance (PT) Description: LTG:  Patient will maintain dynamic standing balance with assistance during mobility activities (PT) Flowsheets (Taken 09/06/2023 1113) LTG: Pt will maintain dynamic standing balance during mobility activities with:: Contact Guard/Touching assist   Problem: RH Bed Mobility Goal: LTG Patient will perform bed mobility with assist (PT) Description: LTG: Patient will perform bed mobility with assistance, with/without cues (PT). Flowsheets (Taken 09/06/2023 1113) LTG: Pt will perform bed mobility with assistance level of: Supervision/Verbal cueing   Problem: RH Bed to Chair Transfers Goal: LTG Patient will perform bed/chair transfers w/assist (PT) Description: LTG: Patient will perform bed to chair transfers with assistance (PT). Flowsheets (Taken 09/06/2023 1113) LTG: Pt will perform Bed to Chair Transfers with assistance level: Supervision/Verbal cueing   Problem: RH Car Transfers Goal: LTG Patient will perform car transfers with assist (PT) Description: LTG: Patient will perform car transfers with assistance (PT). Flowsheets (Taken 09/06/2023 1113) LTG: Pt will perform car transfers with assist:: Minimal Assistance - Patient > 75%   Problem: RH Ambulation Goal: LTG Patient will ambulate in controlled environment (PT) Description: LTG: Patient will ambulate in a controlled environment, # of feet with assistance (PT). Flowsheets (Taken 09/06/2023 1113) LTG: Pt will ambulate in controlled environ  assist needed:: Contact Guard/Touching assist LTG: Ambulation distance in controlled  environment: 191ft Goal: LTG Patient will ambulate in home environment (PT) Description: LTG: Patient will ambulate in home environment, # of feet with assistance (PT). Flowsheets (Taken 09/06/2023 1113) LTG: Pt will ambulate in home environ  assist needed:: Contact Guard/Touching assist LTG: Ambulation distance in home environment: 26ft   Problem: RH Stairs Goal: LTG Patient will ambulate up and down stairs w/assist (PT) Description: LTG: Patient will ambulate up and down # of stairs with assistance (PT) Flowsheets (Taken 09/06/2023 1113) LTG: Pt will ambulate up/down stairs assist needed:: Minimal Assistance - Patient > 75% LTG: Pt will  ambulate up and down number of stairs: 4 steps with 2 hand rails as per home setup

## 2023-09-06 NOTE — Plan of Care (Signed)
  Problem: Consults Goal: RH STROKE PATIENT EDUCATION Description: See Patient Education module for education specifics  Outcome: Progressing   Problem: RH BOWEL ELIMINATION Goal: RH STG MANAGE BOWEL WITH ASSISTANCE Description: STG Manage Bowel with supervision Assistance. Outcome: Progressing Goal: RH STG MANAGE BOWEL W/MEDICATION W/ASSISTANCE Description: STG Manage Bowel with Medication with Assistance. Outcome: Progressing   Problem: RH BLADDER ELIMINATION Goal: RH STG MANAGE BLADDER WITH ASSISTANCE Description: STG Manage Bladder With supervision Assistance Outcome: Progressing   Problem: RH SKIN INTEGRITY Goal: RH STG SKIN FREE OF INFECTION/BREAKDOWN Description: Manage skin free of infection/breakdown with supervision assistance Outcome: Progressing   Problem: RH SAFETY Goal: RH STG ADHERE TO SAFETY PRECAUTIONS W/ASSISTANCE/DEVICE Description: STG Adhere to Safety Precautions With supervision  Assistance/Device. Outcome: Progressing   Problem: RH COGNITION-NURSING Goal: RH STG USES MEMORY AIDS/STRATEGIES W/ASSIST TO PROBLEM SOLVE Description: STG Uses Memory Aids/Strategies With supervision Assistance to Problem Solve. Outcome: Progressing   Problem: RH PAIN MANAGEMENT Goal: RH STG PAIN MANAGED AT OR BELOW PT'S PAIN GOAL Description: <4 w/ prns Outcome: Progressing   Problem: RH KNOWLEDGE DEFICIT Goal: RH STG INCREASE KNOWLEDGE OF DIABETES Description: Manage knowledge of diabetes with supervision assistance from daughter using educational materials provided Outcome: Progressing Goal: RH STG INCREASE KNOWLEDGE OF HYPERTENSION Description: Manage knowledge of hypertension with supervision assistance from daughter using educational materials provided Outcome: Progressing Goal: RH STG INCREASE KNOWLEGDE OF HYPERLIPIDEMIA Description: Manage knowledge of hyperlipidemia with supervision assistance from daughter using educational materials provided Outcome:  Progressing Goal: RH STG INCREASE KNOWLEDGE OF STROKE PROPHYLAXIS Description: Manage knowledge of stroke prophylaxis with supervision assistance from daughter using educational materials provided Outcome: Progressing

## 2023-09-06 NOTE — Progress Notes (Signed)
 Inpatient Rehabilitation  Patient information reviewed and entered into eRehab system by [facility]. Karen Kays., CCC/SLP, PPS Coordinator.  Information including medical coding, functional ability and quality indicators will be reviewed and updated through discharge.

## 2023-09-06 NOTE — Evaluation (Signed)
 Occupational Therapy Assessment and Plan  Patient Details  Name: Allen Dyer MRN: [id number] Date of Birth: 22-Jul-1935  OT Diagnosis: abnormal posture, acute pain, cognitive deficits, disturbance of vision, hemiplegia affecting dominant side, muscle weakness (generalized), pain in joint, and swelling of limb Rehab Potential: Rehab Potential (ACUTE ONLY): Good ELOS: 12-14 days   Today's Date: 09/06/2023 OT Individual Time: 4098-1191 OT Individual Time Calculation (min): 87 min     Hospital Problem: Principal Problem:   Ischemic cerebrovascular accident (CVA) (HCC)   Past Medical History:  Past Medical History:  Diagnosis Date   Chronic combined systolic and diastolic CHF (congestive heart failure) (HCC)    CKD (chronic kidney disease), stage III (HCC)    Hyperlipidemia    Hypertension    Lower extremity edema    Mild CAD    a. mild-mod by cath 05/2018.   Multiple myeloma (HCC) 11/15/2018   NICM (nonischemic cardiomyopathy) (HCC)    Peripheral neuropathy    Prostate cancer (HCC)    Status post XRT   Rheumatic fever    Stroke (HCC)    Trifascicular block    Type 2 diabetes mellitus (HCC)    Vertigo    Past Surgical History:  Past Surgical History:  Procedure Laterality Date   RIGHT HEART CATH N/A 07/10/2018   Procedure: RIGHT HEART CATH;  Surgeon: Darlis Eisenmenger, MD;  Location: [facility] INVASIVE CV LAB;  Service: Cardiovascular;  Laterality: N/A;   RIGHT/LEFT HEART CATH AND CORONARY ANGIOGRAPHY N/A 06/18/2018   Procedure: RIGHT/LEFT HEART CATH AND CORONARY ANGIOGRAPHY;  Surgeon: Millicent Ally, MD;  Location: [facility] INVASIVE CV LAB;  Service: Cardiovascular;  Laterality: N/A;    Assessment & Plan Clinical Impression: Allen Dyer is an 88 year old right-handed male with history significant for chronic combined systolic and diastolic heart failure, nonischemic cardiomyopathy, CVA, prostate cancer status post XRT/BPH, GERD, gout, multiple myeloma followed, nonobstructive CAD,  hyperlipidemia, hypertension, diabetes mellitus with peripheral neuropathy and CKD stage IV.  Patient lives with his daughter.  Patient is able to live on the main level with bedroom and bath.  Reportedly independent prior to admission.  Presented 09/02/2023 with progressive unsteady gait times several weeks as well as falls x 2.  MRI showed patchy early subacute ischemic infarcts involving the right greater than left cerebellar hemispheres.  Associated minimal petechial blood products of the right cerebellum without hemorrhagic transformation.  Pt reports injury to R wrist after fall. X-rays of the right wrist showed no fracture.  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  Irregular reconstitution of the distal V2 segment of the right vertebral artery with occlusion of the right V3 and V4 segments.  Left vertebral artery occluded at the V2 segment from the level of C4-5.  CT cervical spine showed hypodense lesions in the cerebellar hemispheres, measuring 3.4 x 3.2 cm on the right 0.8 cm and 1.8 x 1.1 cm on the left.  No fracture or subluxation.  Admission chemistries unremarkable aside glucose 171 BUN 38 creatinine 2.28, urine culture greater than 100,000 Klebsiella oxytoca, hemoglobin A1c 7.0.  Echocardiogram with ejection fraction of 25 to 30% grade 1 diastolic dysfunction left ventricle demonstrating global hypokinesis.  Neurology follow-up placed on aspirin  as well as Plavix  for CVA prophylaxis x 3 months then Plavix  alone.  Lovenox  added for DVT prophylaxis.  Reports chronic bladder urgency. Patient received 2 days of Rocephin for UTI transitioned to Duricef.  Tolerating a regular consistency diet.  Therapy evaluations completed due to patient's decreased functional mobility was admitted  for a comprehensive rehab program.  Patient transferred to CIR on 09/05/2023 .    Patient currently requires mod with basic self-care skills secondary to impaired timing and sequencing and decreased coordination,  decreased visual acuity and decreased visual perceptual skills, decreased motor planning, decreased attention, decreased problem solving, decreased memory, and delayed processing, and central origin.  Prior to hospitalization, patient could complete BADL with independent .  Patient will benefit from skilled intervention to decrease level of assist with basic self-care skills and increase independence with basic self-care skills prior to discharge home with care partner.  Anticipate patient will require minimal physical assistance and follow up home health and follow up outpatient.  OT - End of Session Activity Tolerance: Decreased this session Endurance Deficit: Yes OT Assessment Rehab Potential (ACUTE ONLY): Good OT Patient demonstrates impairments in the following area(s): Balance;Motor;Cognition;Pain;Vision;Edema;Perception;Endurance;Safety OT Basic ADL's Functional Problem(s): Bathing;Dressing;Toileting OT Transfers Functional Problem(s): Toilet;Tub/Shower OT Additional Impairment(s): Fuctional Use of Upper Extremity OT Plan OT Intensity: Minimum of 1-2 x/day, 45 to 90 minutes OT Frequency: 5 out of 7 days OT Duration/Estimated Length of Stay: 12-14 days OT Treatment/Interventions: Balance/vestibular training;Neuromuscular re-education;Patient/family education;Self Care/advanced ADL retraining;Splinting/orthotics;Therapeutic Exercise;UE/LE Coordination activities;Visual/perceptual remediation/compensation;UE/LE Strength taining/ROM;Therapeutic Activities;Pain management;Functional mobility training;DME/adaptive equipment instruction;Discharge planning;Cognitive remediation/compensation OT Self Feeding Anticipated Outcome(s): Independent OT Basic Self-Care Anticipated Outcome(s): Min assist OT Toileting Anticipated Outcome(s): Min assist OT Bathroom Transfers Anticipated Outcome(s): Min assist OT Recommendation Patient destination: Home Follow Up Recommendations: Home health OT;Outpatient  OT Equipment Recommended: To be determined   OT Evaluation Precautions/Restrictions  Precautions Precautions: Fall;Other (comment) (DNR) Precaution/Restrictions Comments: Urinary incontinence, DNR Required Braces or Orthoses: Splint/Cast Splint/Cast: R wrist cock up splint Restrictions Other Position/Activity Restrictions: as tolerates   Pain Pain Assessment Pain Scale: 0-10 Pain Score: 6  Pain Type: Acute pain Pain Location: Wrist Pain Orientation: Right Pain Descriptors / Indicators: Aching Pain Onset: On-going Patients Stated Pain Goal: 0 Pain Intervention(s): Medication (See eMAR);Repositioned Multiple Pain Sites: No Home Living/Prior Functioning Home Living Family/patient expects to be discharged to:: Private residence Living Arrangements: Children, Other relatives Available Help at Discharge: Family, Available 24 hours/day Type of Home: House Home Access: Stairs to enter [employer] of Steps: 5 steps front entrance, 2 steps garage entrance Entrance Stairs-Rails: Can reach both Home Layout: Two level, Able to live on main level with bedroom/bathroom, Bed/bath upstairs Alternate Level Stairs-Number of Steps: 10 Alternate Level Stairs-Rails: Left Bathroom Shower/Tub: Engineer, manufacturing systems: Handicapped height Bathroom Accessibility: Yes Additional Comments: lives with daughter and family  Lives With: Daughter, Family IADL History Homemaking Responsibilities: No Current License: No 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, 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 Leisure and Hobbies: spiritual music Prior Function Level of Independence: Independent with basic ADLs, Independent with transfers, Independent with gait  Able to Take Stairs?: Yes Driving: No Vocation: Retired Administrator, sports Baseline Vision/History: 1 Wears glasses Ability to See in Adequate Light: 0 Adequate Patient Visual Report: Nausea/blurring vision with head  movement;Blurring of vision;Eye fatigue/eye pain/headache;Diplopia Vision Assessment?: Vision impaired- to be further tested in functional context Eye Alignment: Within Functional Limits Ocular Range of Motion: Restricted on the left Alignment/Gaze Preference: Within Defined Limits Tracking/Visual Pursuits: Decreased smoothness of vertical tracking;Decreased smoothness of horizontal tracking Saccades: Decreased speed of saccadic movement;Impaired - to be further tested in functional context Convergence: Impaired - to be further tested in functional context Visual Fields: No apparent deficits Additional Comments: Poor visual fization, gaze stabilization, mild eye coordination deficit - tied to body movement Perception  Perception:  Impaired Praxis Praxis: WFL Cognition Cognition Overall Cognitive Status: Impaired/Different from baseline Arousal/Alertness: Awake/alert Orientation Level: Person;Place;Situation Person: Oriented Place: Oriented Situation: Oriented Memory: Impaired Memory Impairment: Retrieval deficit Attention: Focused;Sustained;Selective Focused Attention: Appears intact Sustained Attention: Appears intact Selective Attention: Impaired Selective Attention Impairment: Functional basic Awareness: Impaired Awareness Impairment: Emergent impairment Problem Solving: Impaired Problem Solving Impairment: Functional basic Executive Function: Initiating;Organizing Organizing: Impaired Organizing Impairment: Functional basic Initiating: Impaired Initiating Impairment: Functional basic Safety/Judgment: Appears intact Brief Interview for Mental Status (BIMS) Repetition of Three Words (First Attempt): 3 Temporal Orientation: Year: Correct Temporal Orientation: Month: Accurate within 5 days Temporal Orientation: Day: No answer Recall: "Sock": Yes, no cue required Recall: "Blue": Yes, after cueing ("a color") Recall: "Bed": Yes, after cueing ("a piece of furniture") BIMS  Summary Score: 12 Sensation Sensation Light Touch: Appears Intact Hot/Cold: Appears Intact Proprioception: Impaired by gross assessment Stereognosis: Not tested Coordination Gross Motor Movements are Fluid and Coordinated: No Coordination and Movement Description: generalized weakness, stiffness, and R wrist pain limiting Motor  Motor Motor: Abnormal postural alignment and control;Hemiplegia;Motor apraxia Motor - Skilled Clinical Observations: mild R hemi  Trunk/Postural Assessment  Cervical Assessment Cervical Assessment: Exceptions to [facility] Thoracic Assessment Thoracic Assessment: Exceptions to [facility] Lumbar Assessment Lumbar Assessment: Exceptions to [facility] Postural Control Postural Control: Deficits on evaluation Trunk Control: posterior bias  Balance Balance Balance Assessed: Yes Static Sitting Balance Static Sitting - Balance Support: Left upper extremity supported;Feet supported Static Sitting - Level of Assistance: 5: Stand by assistance Dynamic Sitting Balance Dynamic Sitting - Balance Support: Left upper extremity supported;Feet supported;During functional activity Dynamic Sitting - Level of Assistance: 4: Min assist Static Standing Balance Static Standing - Balance Support: Left upper extremity supported;During functional activity Static Standing - Level of Assistance: 3: Mod assist Dynamic Standing Balance Dynamic Standing - Balance Support: Left upper extremity supported;During functional activity Dynamic Standing - Level of Assistance: 3: Mod assist Extremity/Trunk Assessment RUE Assessment RUE Assessment: Exceptions to [facility] Active Range of Motion (AROM) Comments: Raises arms to ~ !00* shoulder flexion - reports this is premorbid General Strength Comments: NT wrist injury - swollen painful RUE Body System: Neuro Brunstrum levels for arm and hand: Arm;Hand Brunstrum level for arm: Stage IV Movement is deviating from synergy LUE Assessment LUE Assessment: Exceptions  to [facility] Active Range of Motion (AROM) Comments: Raises arms to ~ !00* shoulder flexion - reports this is premorbid LUE Body System: Neuro Brunstrum levels for arm and hand: Arm;Hand Brunstrum level for arm: Stage IV Movement is deviating from synergy Brunstrum level for hand: Stage VI Isolated joint movements  Care Tool Care Tool Self Care Eating Eating activity did not occur: Refused      Oral Care    Oral Care Assist Level: Minimal Assistance - Patient > 75%    Bathing   Body parts bathed by patient: Right arm;Chest;Abdomen;Front perineal area;Right upper leg;Left upper leg;Face Body parts bathed by helper: Left arm;Right lower leg;Left lower leg;Buttocks   Assist Level: Moderate Assistance - Patient 50 - 74%    Upper Body Dressing(including orthotics)   What is the patient wearing?: Pull over shirt   Assist Level: Minimal Assistance - Patient > 75%    Lower Body Dressing (excluding footwear)   What is the patient wearing?: Incontinence brief;Pants Assist for lower body dressing: Maximal Assistance - Patient 25 - 49%    Putting on/Taking off footwear   What is the patient wearing?: Socks Assist for footwear: Dependent - Patient 0%  Care Tool Toileting Toileting activity Toileting Activity did not occur (Clothing management and hygiene only): N/A (no void or bm)       Care Tool Bed Mobility Roll left and right activity   Roll left and right assist level: Moderate Assistance - Patient 50 - 74%    Sit to lying activity   Sit to lying assist level: Moderate Assistance - Patient 50 - 74%    Lying to sitting on side of bed activity   Lying to sitting on side of bed assist level: the ability to move from lying on the back to sitting on the side of the bed with no back support.: Moderate Assistance - Patient 50 - 74%     Care Tool Transfers Sit to stand transfer   Sit to stand assist level: Moderate Assistance - Patient 50 - 74%    Chair/bed transfer   Chair/bed  transfer assist level: Moderate Assistance - Patient 50 - 74%     Toilet transfer Toilet transfer activity did not occur: Refused       Care Tool Cognition  Expression of Ideas and Wants Expression of Ideas and Wants: 4. Without difficulty (complex and basic) - expresses complex messages without difficulty and with speech that is clear and easy to understand  Understanding Verbal and Non-Verbal Content Understanding Verbal and Non-Verbal Content: 3. Usually understands - understands most conversations, but misses some part/intent of message. Requires cues at times to understand   Memory/Recall Ability Memory/Recall Ability : That he or she is in a hospital/hospital unit   Refer to Care Plan for Long Term Goals  SHORT TERM GOAL WEEK 1 OT Short Term Goal 1 (Week 1): Patient will transfer to shower with min assist OT Short Term Goal 2 (Week 1): Patient will transfer to toilet with min assist OT Short Term Goal 3 (Week 1): Patient will dress upper body with set up assist OT Short Term Goal 4 (Week 1): Patient will dress lower body with mod assist OT Short Term Goal 5 (Week 1): Patient will report pain in right wrist reduced to 4/10 following gentle range/ mobilization  Recommendations for other services: Neuropsych   Skilled Therapeutic Intervention:   Patient received supine in bed, sleeping.  Patient slow to arouse, but agreeable to OT session. Patient reports feeling dizzy since 3am.  Although when questioned further, patient indicates that he had no symptoms of dizziness while he was supine - but when he changed position.  Patient assisted to sitting at edge of bed and to wheelchair. Patient experienced "dizziness" and brief diplopia - which resolved in seconds.  Completed shower seated at levels listed below.  Patient's daughter arrived during session with clothing and able to verify patient's responses regarding discharge disposition.  Patient is right handed, but is not using right hand  functionally due to injury and wrist brace - pain and edema.  Patient left up in wheelchair at end of session with daughter at bedside, and personal items in reach.   ADL ADL Eating: Not assessed Grooming: Minimal assistance Where Assessed-Grooming: Sitting at sink Upper Body Bathing: Minimal assistance Where Assessed-Upper Body Bathing: Shower Lower Body Bathing: Moderate assistance Where Assessed-Lower Body Bathing: Sitting at sink;Standing at sink Upper Body Dressing: Moderate assistance Where Assessed-Upper Body Dressing: Sitting at sink Lower Body Dressing: Maximal assistance Where Assessed-Lower Body Dressing: Sitting at sink;Standing at sink Toileting: Unable to assess Toilet Transfer: Unable to assess Toilet Transfer Method: Unable to assess Tub/Shower Transfer: Unable to assess [employer]  Transfer: Moderate assistance Film/video editor Method: Warden/ranger: Event organiser  Bed Mobility Bed Mobility: Sit to Supine;Supine to Sit Supine to Sit: Moderate Assistance - Patient 50-74% Sit to Supine: Moderate Assistance - Patient 50-74% Transfers Sit to Stand: Moderate Assistance - Patient 50-74% Stand to Sit: Moderate Assistance - Patient 50-74%   Discharge Criteria: Patient will be discharged from OT if patient refuses treatment 3 consecutive times without medical reason, if treatment goals not met, if there is a change in medical status, if patient makes no progress towards goals or if patient is discharged from hospital.  The above assessment, treatment plan, treatment alternatives and goals were discussed and mutually agreed upon: by patient and by family  Allen Dyer 09/06/2023, 2:57 PM

## 2023-09-07 ENCOUNTER — Encounter (HOSPITAL_BASED_OUTPATIENT_CLINIC_OR_DEPARTMENT_OTHER): Payer: Self-pay | Admitting: Cardiology

## 2023-09-07 ENCOUNTER — Ambulatory Visit: Payer: Medicare Other | Admitting: Internal Medicine

## 2023-09-07 ENCOUNTER — Encounter: Payer: Self-pay | Admitting: Podiatry

## 2023-09-07 LAB — BASIC METABOLIC PANEL WITH GFR
Anion gap: 8 (ref 5–15)
BUN: 50 mg/dL — ABNORMAL HIGH (ref 8–23)
CO2: 28 mmol/L (ref 22–32)
Calcium: 8.8 mg/dL — ABNORMAL LOW (ref 8.9–10.3)
Chloride: 102 mmol/L (ref 98–111)
Creatinine, Ser: 2.45 mg/dL — ABNORMAL HIGH (ref 0.61–1.24)
GFR, Estimated: 25 mL/min — ABNORMAL LOW (ref >60)
Glucose, Bld: 101 mg/dL — ABNORMAL HIGH (ref 70–99)
Potassium: 3.5 mmol/L (ref 3.5–5.1)
Sodium: 138 mmol/L (ref 135–145)

## 2023-09-07 LAB — CBC WITH DIFFERENTIAL/PLATELET
Abs Immature Granulocytes: 0.02 10*3/uL (ref 0.00–0.07)
Basophils Absolute: 0 10*3/uL (ref 0.0–0.1)
Basophils Relative: 0 %
Eosinophils Absolute: 0.1 10*3/uL (ref 0.0–0.5)
Eosinophils Relative: 2 %
HCT: 32.3 % — ABNORMAL LOW (ref 39.0–52.0)
Hemoglobin: 10.6 g/dL — ABNORMAL LOW (ref 13.0–17.0)
Immature Granulocytes: 0 %
Lymphocytes Relative: 13 %
Lymphs Abs: 0.8 10*3/uL (ref 0.7–4.0)
MCH: 32.8 pg (ref 26.0–34.0)
MCHC: 32.8 g/dL (ref 30.0–36.0)
MCV: 100 fL (ref 80.0–100.0)
Monocytes Absolute: 0.7 10*3/uL (ref 0.1–1.0)
Monocytes Relative: 13 %
Neutro Abs: 4.2 10*3/uL (ref 1.7–7.7)
Neutrophils Relative %: 72 %
Platelets: 183 10*3/uL (ref 150–400)
RBC: 3.23 MIL/uL — ABNORMAL LOW (ref 4.22–5.81)
RDW: 13.1 % (ref 11.5–15.5)
WBC: 5.8 10*3/uL (ref 4.0–10.5)
nRBC: 0 % (ref 0.0–0.2)

## 2023-09-07 LAB — GLUCOSE, CAPILLARY
Glucose-Capillary: 112 mg/dL — ABNORMAL HIGH (ref 70–99)
Glucose-Capillary: 172 mg/dL — ABNORMAL HIGH (ref 70–99)
Glucose-Capillary: 162 mg/dL — ABNORMAL HIGH (ref 70–99)
Glucose-Capillary: 200 mg/dL — ABNORMAL HIGH (ref 70–99)
Glucose-Capillary: 169 mg/dL — ABNORMAL HIGH (ref 70–99)

## 2023-09-07 MED ORDER — GLIPIZIDE 5 MG PO TABS
5.0000 mg | ORAL_TABLET | Freq: Two times a day (BID) | ORAL | Status: DC
  Administered 2023-09-07 – 2023-09-27 (×40): 5 mg via ORAL
  Filled 2023-09-07 (×42): qty 1

## 2023-09-07 MED ORDER — CHLORHEXIDINE GLUCONATE CLOTH 2 % EX PADS
6.0000 | MEDICATED_PAD | Freq: Two times a day (BID) | CUTANEOUS | Status: DC
  Administered 2023-09-07 – 2023-09-14 (×14): 6 via TOPICAL

## 2023-09-07 MED ORDER — CEFADROXIL 500 MG PO CAPS
500.0000 mg | ORAL_CAPSULE | Freq: Every day | ORAL | Status: DC
  Administered 2023-09-10 – 2023-09-24 (×15): 500 mg via ORAL
  Filled 2023-09-07 (×15): qty 1

## 2023-09-07 NOTE — Progress Notes (Signed)
 Physical Therapy Session Note  Patient Details  Name: Allen Dyer MRN: [id number] Date of Birth: [birth year]  Today's Date: 09/07/2023 PT Individual Time: 1000-1057 PT Individual Time Calculation (min): 57 min   Short Term Goals: Week 1:  PT Short Term Goal 1 (Week 1): Pt will complete bed mobility with minA PT Short Term Goal 2 (Week 1): Pt will complete bed<>chair transfers with minA PT Short Term Goal 3 (Week 1): pt will ambulate 115ft with minA and LRAD PT Short Term Goal 4 (Week 1): Pt will navigate up/down x4 stairs with modA  Skilled Therapeutic Interventions/Progress Updates:    Patient in wheelchair with daughter in the room.  Completed vestibular evaluation as noted below.  Performed stand pivot transfer to bed with mod to max A no device, standing side steps to [facility] with RW and min to mod A noting significant ataxia, cues for forward gaze on stationary target and for upright posture.  Sit to supine mod A for LE's and positioning, rolling with min A using rail with increased time. Educated in compensation using fixed target for stabilizing gaze during mobility.  Educated on habituation for reduction of symptoms including head turns x 5 to R then back to center with visual target, then side to sit x 5.  Therapy Documentation Precautions:  Precautions Precautions: Fall, Other (comment) (DNR) Precaution/Restrictions Comments: Urinary incontinence, DNR Required Braces or Orthoses: Splint/Cast Splint/Cast: R wrist cock up splint Restrictions Weight Bearing Restrictions Per Provider Order: No Other Position/Activity Restrictions: as tolerates  Pain: Pain Assessment Faces Pain Scale: Hurts little more Pain Location: Leg Pain Orientation: Left;Anterior Pain Descriptors / Indicators: Sore Pain Onset: With Activity Pain Intervention(s): Repositioned (cleansed and applied Mepilex)  Other Treatments:    Vestibular Assessment - 09/07/23 0001       Symptom Behavior   Subjective  history of current problem Reports symptoms that started with stroke though also fell and may have hit his head    Type of Dizziness  Imbalance;Lightheadedness;Blurred vision    Frequency of Dizziness intermittent    Duration of Dizziness minutes    Symptom Nature Motion provoked;Intermittent;Variable    Aggravating Factors Supine to sit;Turning head quickly   turning head to R then back to L takes a minute to clear vision   Relieving Factors Lying supine;Closing eyes;Rest;Slow movements    Progression of Symptoms Better    History of similar episodes some issues recently with "vertigo"      Oculomotor Exam   Oculomotor Alignment Normal    Ocular ROM WFL    Spontaneous Absent    Gaze-induced  Direction changing nystagmus    Smooth Pursuits Saccades    Saccades Slow      Oculomotor Exam-Fixation Suppressed    Left Head Impulse positive for refixation bilateral    Right Head Impulse positive for refixation bilateral      Vestibulo-Ocular Reflex   VOR 1 Head Only (x 1 viewing) difficulty with target maintenance with horizontal head movements, tolerated only a few head turns then stated dizziness "10/10"; completed 5 head nods without increase in dizziness though reports feeling like a pulling in the back of head esp downward head movement    VOR Cancellation Corrective saccades      Auditory   Comments decreased hearing bilateral unable to note scratch test      Positional Testing   Horizontal Canal Testing Horizontal Canal Right;Horizontal Canal Left      Horizontal Canal Right   Horizontal Canal Right Duration 1 minute  Horizontal Canal Right Symptoms Ageotrophic;Nystagmus   lasting maybe 20 sec     Horizontal Canal Left   Horizontal Canal Left Duration 1 minute    Horizontal Canal Left Symptoms Normal      Positional Sensitivities   Supine to Left Side No dizziness    Supine to Right Side Severe dizziness    Supine to Sitting Moderate dizziness    Right Knee to Sitting  Lightedness    Nose to Left Knee Moderate dizziness    Head Turning x 5 Moderate dizziness               Therapy/Group: Individual Therapy  Marley Simmers 09/07/2023, 2:23 PM  Abigail Hoff, PT

## 2023-09-07 NOTE — Plan of Care (Signed)
  Problem: Consults Goal: RH STROKE PATIENT EDUCATION Description: See Patient Education module for education specifics  Outcome: Progressing   Problem: RH BOWEL ELIMINATION Goal: RH STG MANAGE BOWEL WITH ASSISTANCE Description: STG Manage Bowel with supervision Assistance. Outcome: Progressing Goal: RH STG MANAGE BOWEL W/MEDICATION W/ASSISTANCE Description: STG Manage Bowel with Medication with Assistance. Outcome: Progressing   Problem: RH BLADDER ELIMINATION Goal: RH STG MANAGE BLADDER WITH ASSISTANCE Description: STG Manage Bladder With supervision Assistance Outcome: Progressing   Problem: RH SKIN INTEGRITY Goal: RH STG SKIN FREE OF INFECTION/BREAKDOWN Description: Manage skin free of infection/breakdown with supervision assistance Outcome: Progressing   Problem: RH SAFETY Goal: RH STG ADHERE TO SAFETY PRECAUTIONS W/ASSISTANCE/DEVICE Description: STG Adhere to Safety Precautions With supervision  Assistance/Device. Outcome: Progressing   Problem: RH COGNITION-NURSING Goal: RH STG USES MEMORY AIDS/STRATEGIES W/ASSIST TO PROBLEM SOLVE Description: STG Uses Memory Aids/Strategies With supervision Assistance to Problem Solve. Outcome: Progressing   Problem: RH PAIN MANAGEMENT Goal: RH STG PAIN MANAGED AT OR BELOW PT'S PAIN GOAL Description: <4 w/ prns Outcome: Progressing   Problem: RH KNOWLEDGE DEFICIT Goal: RH STG INCREASE KNOWLEDGE OF DIABETES Description: Manage knowledge of diabetes with supervision assistance from daughter using educational materials provided Outcome: Progressing Goal: RH STG INCREASE KNOWLEDGE OF HYPERTENSION Description: Manage knowledge of hypertension with supervision assistance from daughter using educational materials provided Outcome: Progressing Goal: RH STG INCREASE KNOWLEGDE OF HYPERLIPIDEMIA Description: Manage knowledge of hyperlipidemia with supervision assistance from daughter using educational materials provided Outcome:  Progressing Goal: RH STG INCREASE KNOWLEDGE OF STROKE PROPHYLAXIS Description: Manage knowledge of stroke prophylaxis with supervision assistance from daughter using educational materials provided Outcome: Progressing

## 2023-09-07 NOTE — Progress Notes (Signed)
 PROGRESS NOTE   Subjective/Complaints: Dizzy when lifting head upward, asked nursing to weigh patient, discussed vestibular eval today Right wrist pain is improved  ROS: +right wrist pain, +dizziness   Objective:   No results found.  Recent Labs    09/06/23 0542 09/07/23 0647  WBC 6.7 5.8  HGB 10.7* 10.6*  HCT 32.9* 32.3*  PLT 174 183   Recent Labs    09/06/23 0542 09/07/23 0647  NA 136 138  K 4.0 3.5  CL 100 102  CO2 24 28  GLUCOSE 147* 101*  BUN 49* 50*  CREATININE 2.80* 2.45*  CALCIUM  8.9 8.8*    Intake/Output Summary (Last 24 hours) at 09/07/2023 1237 Last data filed at 09/07/2023 1000 Gross per 24 hour  Intake 958 ml  Output 3164 ml  Net -2206 ml        Physical Exam: Vital Signs Blood pressure 133/73, pulse 69, temperature 98.2 F (36.8 C), resp. rate 18, height 5\' 7"  (1.702 m), weight 95.6 kg, SpO2 94%. Gen: no distress, normal appearing HEENT: oral mucosa pink and moist, NCAT Cardio: Reg rate Chest: normal effort, normal rate of breathing Abd: soft, non-distended Ext: no edema Psych: pleasant, normal affect Skin: intact MOTOR: LUE: 4+/5 Deltoid, 4+/5 Biceps, 4+/5 Triceps,4+/5 Grip RUE: 3/5 Deltoid, 3/5 Biceps, 3/5 Triceps, 3/5 Grip   LLE: HF 4/5, KE 4+/5, ADF 4/5, APF 4/5 RLE: HF 4/5, KE 4+/5, ADF 4/5, APF 4/5   REFLEXES: 2/4 throughout, bilateral flexor plantar response, no Hoffman's, no clonus   SENSORY: Normal to touch all 4 extremities   Coordination: Normal finger to nose LUE   No hypertonia noted   MSK: R wrist ttp, wearing wrist brace, dizziness caused by cervical spine extension   Assessment/Plan: 1. Functional deficits which require 3+ hours per day of interdisciplinary therapy in a comprehensive inpatient rehab setting. Physiatrist is providing close team supervision and 24 hour management of active medical problems listed below. Physiatrist and rehab team continue  to assess barriers to discharge/monitor patient progress toward functional and medical goals  Care Tool:  Bathing    Body parts bathed by patient: Right arm, Chest, Abdomen, Front perineal area, Right upper leg, Left upper leg, Face   Body parts bathed by helper: Left arm, Right lower leg, Left lower leg, Buttocks     Bathing assist Assist Level: Moderate Assistance - Patient 50 - 74%     Upper Body Dressing/Undressing Upper body dressing   What is the patient wearing?: Pull over shirt    Upper body assist Assist Level: Minimal Assistance - Patient > 75%    Lower Body Dressing/Undressing Lower body dressing      What is the patient wearing?: Incontinence brief, Pants     Lower body assist Assist for lower body dressing: Maximal Assistance - Patient 25 - 49%     Toileting Toileting Toileting Activity did not occur (Clothing management and hygiene only): N/A (no void or bm)  Toileting assist Assist for toileting: Total Assistance - Patient < 25%     Transfers Chair/bed transfer  Transfers assist     Chair/bed transfer assist level: Moderate Assistance - Patient 50 - 74%  Locomotion Ambulation   Ambulation assist      Assist level: Moderate Assistance - Patient 50 - 74% Assistive device: Hand held assist Max distance: 58ft   Walk 10 feet activity   Assist     Assist level: Moderate Assistance - Patient - 50 - 74% Assistive device: Hand held assist   Walk 50 feet activity   Assist    Assist level: Minimal Assistance - Patient > 75% Assistive device: Walker-Eva    Walk 150 feet activity   Assist    Assist level: Minimal Assistance - Patient > 75% Assistive device: Walker-Eva    Walk 10 feet on uneven surface  activity   Assist Walk 10 feet on uneven surfaces activity did not occur: Safety/medical concerns         Wheelchair     Assist Is the patient using a wheelchair?: Yes Type of Wheelchair: Manual    Wheelchair  assist level: Dependent - Patient 0%      Wheelchair 50 feet with 2 turns activity    Assist        Assist Level: Dependent - Patient 0%   Wheelchair 150 feet activity     Assist      Assist Level: Dependent - Patient 0%   Blood pressure 133/73, pulse 69, temperature 98.2 F (36.8 C), resp. rate 18, height 5\' 7"  (1.702 m), weight 95.6 kg, SpO2 94%.    Medical Problem List and Plan: 1. Functional deficits secondary to bilateral cerebellar infarct, right more than left secondary large vessel disease from severe posterior circulation stenosis             -patient may shower             -ELOS/Goals: 10-12,PT/OT sup to mod I             -Grounds pass ordered 2.  Antithrombotics: -DVT/anticoagulation:  Pharmaceutical: Lovenox              -antiplatelet therapy: Aspirin  81 mg daily and Plavix  75 mg day x 3 months then Plavix  alone 3. Pain Management: Neurontin  300 mg twice daily 4. Mood/Behavior/Sleep: Provide emotional support             -antipsychotic agents: N/A 5. Neuropsych/cognition: This patient is capable of making decisions on his own behalf. 6. Skin/Wound Care: Routine skin checks 7. Fluids/Electrolytes/Nutrition: Routine in and outs with follow-up chemistries 8.  Klebsiella oxytoca UTI.  Completing course of Duricef 9.  CKD stage IV.  Discussed that creatinine is improving  10.  Chronic combined systolic and diastolic congestive heart failure. EF 25-30% Monitor for any signs of fluid overload.  Follow-up cardiology services.  Continue Demadex  60 mg twice daily, weight reviewed and is increased, daily weights ordered, weights are stable/increased, continue current regimen  11.  Nonischemic cardiomyopathy.  Followed by cardiology service Dr. Micael Adas.  Continue Imdur  30 mg daily and Coreg  6.25 mg twice daily.  12.  History of prostate cancer, BPH.  Followed by Dr. Freddi Jaeger.  Flomax  0.4 mg twice daily             -Check PVR/bladder scan  13.  Diabetes mellitus with  peripheral neuropathy and hemoglobin A1c 7.0.  Glucotrol  5 mg daily  14. HTN. Long term goal normotensive. Home meds carvedilol , imdur , toresemide  15. Obesity. Body mass index is 33.67 kg/m.             -dietary education  16. R wrist pain due to fall. Wearing wrist brace. Xray without  fracture. Voltaren  gel scheduled             -tylenol  prn  17. Constipation. Miralax daily, senokot, encouraged prune juice  18. Dizziness: vestibular eval ordered  19. Anemia: stool occult ordered, repeat CBC ordered for tomororw  LOS: 2 days A FACE TO FACE EVALUATION WAS PERFORMED  Keven Pel Jlynn Ly 09/07/2023, 12:37 PM

## 2023-09-07 NOTE — Progress Notes (Signed)
 Physical Therapy Session Note  Patient Details  Name: Allen Dyer MRN: [id number] Date of Birth: [birth year]  Today's Date: 09/07/2023 PT Individual Time: 1415-1530 PT Individual Time Calculation (min): 75 min   Short Term Goals: Week 1:  PT Short Term Goal 1 (Week 1): Pt will complete bed mobility with minA PT Short Term Goal 2 (Week 1): Pt will complete bed<>chair transfers with minA PT Short Term Goal 3 (Week 1): pt will ambulate 142ft with minA and LRAD PT Short Term Goal 4 (Week 1): Pt will navigate up/down x4 stairs with modA  Skilled Therapeutic Interventions/Progress Updates:      Pt sleeping in bed on arrival. Awakens to voice. Agreeable to therapy treatment. No reports of pain at the time. Pt's catheter bag full - drained and documented. NT made aware.   Supine<>Sitting EOB needed mod/maxA with HOB nearly fully raised. Patient having a "dizzy" spell during this and needing to move slow and allow time to adjust to positions.   Requires maxA for forward scooting to EOB to achieve feet flat. Attempted stand pivot transfer but patient becoming fearful of falling and requesting to sit. NT entering room to take routine vitals which were WNL.   Stand pivot transfer from EOB to wheelchair with mod/maxA with ?Ataxia and dizziness impacting balance and stability during transfer. Most of patient's weight on his heels with difficulty shifting weight forwards to correct his posterior bias.   Transported at w/c level to main gym. Sit<>stand to EVA walker with modA. Patient "rocking" and swaying A/P direction with patient feeling the sensation of dizziness. Pt reports feeling the spins and having difficulty reducing the sway. He was able to ambulate ~170ft + ~147ft with min/modA and EVA walker with cues for upright and gaze stabilization.   Pt assisted onto Kinetron to work on coordination and recpirocal stepping, as well as general activity tolerance and strengthening. ModA for transfer and  for setup. Seat belt used for safety. Completed at L20cm/sec resistance for x8 minutes while encouraging equal weight bearing and cadence.   Family arriving to observe - discussed dizziness and barriers to progression. Family wanting to take patient outside (has grounds pass). RN made aware.  Therapy Documentation Precautions:  Precautions Precautions: Fall, Other (comment) (DNR) Precaution/Restrictions Comments: Urinary incontinence, DNR Required Braces or Orthoses: Splint/Cast Splint/Cast: R wrist cock up splint Restrictions Weight Bearing Restrictions Per Provider Order: No Other Position/Activity Restrictions: as tolerates General:      Therapy/Group: Individual Therapy  Pheobe Brass 09/07/2023, 7:50 AM

## 2023-09-07 NOTE — Progress Notes (Signed)
 Occupational Therapy Session Note  Patient Details  Name: Allen Dyer MRN: [id number] Date of Birth: [birth year]  Today's Date: 09/07/2023 OT Individual [id number]  - 0915 Treatment time:  73 min      Short Term Goals: Week 1:  OT Short Term Goal 1 (Week 1): Patient will transfer to shower with min assist OT Short Term Goal 2 (Week 1): Patient will transfer to toilet with min assist OT Short Term Goal 3 (Week 1): Patient will dress upper body with set up assist OT Short Term Goal 4 (Week 1): Patient will dress lower body with mod assist OT Short Term Goal 5 (Week 1): Patient will report pain in right wrist reduced to 4/10 following gentle range/ mobilization  Skilled Therapeutic Interventions/Progress Updates:    Patient received seated in bed finishing breakfast.  Assisted patient to peel egg.  Assisted patient to sitting position from [facility] raised position.  Patient needing increased time and min assist, improved from mod assist yesterday.  Patient able to stand step transfer with RW today (light contact of RUE on walker) with increased time and min assist.  Patient attempting gaze stabilization initially, but reports soon after in chair that room spinning.  Symptoms persisted x 1 min, BP within normal parameters.  Patient closed eyes and symptoms diminished.  Then able to use gaze stabilization to settle symptoms.  Removed right wrist brace to encourage easy use of dominant right hand for BADL tasks.  Encourgaing gentle active movement to prevent stiffness and reduce edema.  Patient rpeorts pain is slighlty better overall in right wrist.   Provided gentle edema massage and mobility to forearm, fingers, and carpals.  Patient reports slight decrease in pain in wrist.  Splint reapplied.  Daughter arrived toward at end of session. Patient left up in chair with daughter at bedside and chair pad alarm in place and engaged.    Therapy Documentation Precautions:  Precautions Precautions: Fall, Other  (comment) (DNR) Precaution/Restrictions Comments: Urinary incontinence, DNR Required Braces or Orthoses: Splint/Cast Splint/Cast: R wrist cock up splint Restrictions Weight Bearing Restrictions Per Provider Order: No Other Position/Activity Restrictions: as tolerates   Vital Signs: BP 127/79 Pulse 71 Seated Pain:  3/10 right wrist        Therapy/Group: Individual Therapy  Fay Swider M 09/07/2023, 12:48 PM

## 2023-09-08 ENCOUNTER — Inpatient Hospital Stay (HOSPITAL_COMMUNITY)

## 2023-09-08 DIAGNOSIS — R238 Other skin changes: Secondary | ICD-10-CM

## 2023-09-08 DIAGNOSIS — I5042 Chronic combined systolic (congestive) and diastolic (congestive) heart failure: Secondary | ICD-10-CM

## 2023-09-08 DIAGNOSIS — N401 Enlarged prostate with lower urinary tract symptoms: Secondary | ICD-10-CM

## 2023-09-08 DIAGNOSIS — M7989 Other specified soft tissue disorders: Secondary | ICD-10-CM

## 2023-09-08 DIAGNOSIS — R338 Other retention of urine: Secondary | ICD-10-CM

## 2023-09-08 LAB — BASIC METABOLIC PANEL WITH GFR
Anion gap: 11 (ref 5–15)
BUN: 50 mg/dL — ABNORMAL HIGH (ref 8–23)
CO2: 27 mmol/L (ref 22–32)
Calcium: 9 mg/dL (ref 8.9–10.3)
Chloride: 95 mmol/L — ABNORMAL LOW (ref 98–111)
Creatinine, Ser: 2.47 mg/dL — ABNORMAL HIGH (ref 0.61–1.24)
GFR, Estimated: 25 mL/min — ABNORMAL LOW (ref >60)
Glucose, Bld: 301 mg/dL — ABNORMAL HIGH (ref 70–99)
Potassium: 4 mmol/L (ref 3.5–5.1)
Sodium: 133 mmol/L — ABNORMAL LOW (ref 135–145)

## 2023-09-08 LAB — CBC
HCT: 33.6 % — ABNORMAL LOW (ref 39.0–52.0)
Hemoglobin: 10.9 g/dL — ABNORMAL LOW (ref 13.0–17.0)
MCH: 32.6 pg (ref 26.0–34.0)
MCHC: 32.4 g/dL (ref 30.0–36.0)
MCV: 100.6 fL — ABNORMAL HIGH (ref 80.0–100.0)
Platelets: 221 10*3/uL (ref 150–400)
RBC: 3.34 MIL/uL — ABNORMAL LOW (ref 4.22–5.81)
RDW: 13.2 % (ref 11.5–15.5)
WBC: 6.9 10*3/uL (ref 4.0–10.5)
nRBC: 0 % (ref 0.0–0.2)

## 2023-09-08 LAB — CBC WITH DIFFERENTIAL/PLATELET
Abs Immature Granulocytes: 0.02 10*3/uL (ref 0.00–0.07)
Basophils Absolute: 0 10*3/uL (ref 0.0–0.1)
Basophils Relative: 0 %
Eosinophils Absolute: 0.1 10*3/uL (ref 0.0–0.5)
Eosinophils Relative: 2 %
HCT: 31.5 % — ABNORMAL LOW (ref 39.0–52.0)
Hemoglobin: 10.6 g/dL — ABNORMAL LOW (ref 13.0–17.0)
Immature Granulocytes: 0 %
Lymphocytes Relative: 17 %
Lymphs Abs: 1 10*3/uL (ref 0.7–4.0)
MCH: 33.2 pg (ref 26.0–34.0)
MCHC: 33.7 g/dL (ref 30.0–36.0)
MCV: 98.7 fL (ref 80.0–100.0)
Monocytes Absolute: 0.7 10*3/uL (ref 0.1–1.0)
Monocytes Relative: 11 %
Neutro Abs: 4.1 10*3/uL (ref 1.7–7.7)
Neutrophils Relative %: 70 %
Platelets: 219 10*3/uL (ref 150–400)
RBC: 3.19 MIL/uL — ABNORMAL LOW (ref 4.22–5.81)
RDW: 13 % (ref 11.5–15.5)
WBC: 5.8 10*3/uL (ref 4.0–10.5)
nRBC: 0 % (ref 0.0–0.2)

## 2023-09-08 LAB — GLUCOSE, CAPILLARY
Glucose-Capillary: 92 mg/dL (ref 70–99)
Glucose-Capillary: 125 mg/dL — ABNORMAL HIGH (ref 70–99)
Glucose-Capillary: 314 mg/dL — ABNORMAL HIGH (ref 70–99)
Glucose-Capillary: 214 mg/dL — ABNORMAL HIGH (ref 70–99)

## 2023-09-08 MED ORDER — TORSEMIDE 20 MG PO TABS: 60.0000 mg | ORAL_TABLET | Freq: Two times a day (BID) | ORAL | Status: DC

## 2023-09-08 NOTE — Progress Notes (Incomplete Revision)
 Occupational Therapy Session Note  Patient Details  Name: Allen Dyer MRN: [id number] Date of Birth: March 17, 1936  Today's Date: 09/08/2023 OT Individual Time:  -    75 Mins   Short Term Goals: Week 1:  OT Short Term Goal 1 (Week 1): Patient will transfer to shower with min assist OT Short Term Goal 2 (Week 1): Patient will transfer to toilet with min assist OT Short Term Goal 3 (Week 1): Patient will dress upper body with set up assist OT Short Term Goal 4 (Week 1): Patient will dress lower body with mod assist OT Short Term Goal 5 (Week 1): Patient will report pain in right wrist reduced to 4/10 following gentle range/ mobilization  Skilled Therapeutic Interventions/Progress Updates:    Patient seated at w/c LOF. [name] indicated that he rested well on last night with a pain response associated with the R wrist of a 6 on a 0-10. The pt went on to say that his pain response was addressed by nursing. The pt began the session completing  manual manipulation of the R scapular and surrounding structures was completed.  The pt tolerated  the R hand and wrist being immersed in a cold water basin to address his edema.  Retrograde massage was completed on the pt R wrist and hand followed by PROM/AROM to improve mobility lof the extremity as a prime mover.  The pt went on to complete a ball reach exercise by extending bilateral hands forward with a ball in place  and passing the ball to me at about 70 degrees of shld flexion 10 x.  At the end of the session, the pt's RUE was positioned on a pillow with attention to elevating his hand and wrist to reduce the edema.  Prior to leaving the room, the call light and bed side table were place within reach with all additional needs addressed.   Therapy Documentation Precautions:  Precautions Precautions: Fall, Other (comment) (DNR) Precaution/Restrictions Comments: Urinary incontinence, DNR Required Braces or Orthoses: Splint/Cast Splint/Cast: R wrist  cock up splint Restrictions Weight Bearing Restrictions Per Provider Order: No Other Position/Activity Restrictions: as tolerates  Therapy/Group: Individual Therapy  Moises Ang 09/08/2023, 6:38 PM

## 2023-09-08 NOTE — IPOC Note (Signed)
 Overall Plan of Care [facility]) Patient Details Name: Allen Dyer MRN: [id number] DOB: 23-Aug-1935  Admitting Diagnosis: Ischemic cerebrovascular accident (CVA) [facility])  Hospital Problems: Principal Problem:   Ischemic cerebrovascular accident (CVA) (HCC)     Functional Problem List: Nursing Bladder, Edema, Endurance, Medication Management, Pain, Safety  PT Balance, Edema, Motor, Endurance, Pain, Safety  OT Balance, Motor, Cognition, Pain, Vision, Edema, Perception, Endurance, Safety  SLP    TR         Basic ADL's: OT Bathing, Dressing, Toileting     Advanced  ADL's: OT       Transfers: PT Bed Mobility, Bed to Chair, Customer service manager, Tub/Shower     Locomotion: PT Ambulation, Psychologist, prison and probation services, Stairs     Additional Impairments: OT Fuctional Use of Upper Extremity  SLP        TR      Anticipated Outcomes Item Anticipated Outcome  Self Feeding Independent  Swallowing      Basic self-care  Min assist  Toileting  Min assist   Bathroom Transfers Min assist  Bowel/Bladder  maange bladder with medications/toileting assistance/ continent of bowels  Transfers  supervision  Locomotion  CGA  Communication     Cognition     Pain  <4 w/ prns  Safety/Judgment  manage safety with supervision   Therapy Plan: PT Intensity: Minimum of 1-2 x/day ,45 to 90 minutes PT Frequency: 5 out of 7 days PT Duration Estimated Length of Stay: 10-14 days OT Intensity: Minimum of 1-2 x/day, 45 to 90 minutes OT Frequency: 5 out of 7 days OT Duration/Estimated Length of Stay: 12-14 days     Team Interventions: Nursing Interventions Patient/Family Education, Medication Management, Bladder Management, Disease Management/Prevention, Pain Management, Discharge Planning  PT interventions Ambulation/gait training, Balance/vestibular training, Cognitive remediation/compensation, Community reintegration, Discharge planning, Disease management/prevention, DME/adaptive equipment  instruction, Functional electrical stimulation, Functional mobility training, Neuromuscular re-education, Pain management, Patient/family education, Psychosocial support, Skin care/wound management, Splinting/orthotics, Therapeutic Activities, Stair training, UE/LE Strength taining/ROM, Therapeutic Exercise, UE/LE Coordination activities  OT Interventions Balance/vestibular training, Neuromuscular re-education, Patient/family education, Self Care/advanced ADL retraining, Splinting/orthotics, Therapeutic Exercise, UE/LE Coordination activities, Visual/perceptual remediation/compensation, UE/LE Strength taining/ROM, Therapeutic Activities, Pain management, Functional mobility training, DME/adaptive equipment instruction, Discharge planning, Cognitive remediation/compensation  SLP Interventions    TR Interventions    SW/CM Interventions Discharge Planning, Psychosocial Support, Patient/Family Education   Barriers to Discharge MD  Medical stability  Nursing Decreased caregiver support, Home environment access/layout Discharge: House  Discharge Home Layout: 1/2 bath on main level, Other (Comment) (shower upstairs)  Discharge Home Access: Stairs to enter  Entrance Stairs-Rails: Can reach both  Entrance Stairs-Number of Steps: 5  PT Decreased caregiver support, Home environment access/layout, Incontinence, Insurance for SNF coverage, Weight    OT      SLP      SW       Team Discharge Planning: Destination: PT-Home ,OT- Home , SLP-  Projected Follow-up: PT-Home health PT, Outpatient PT, OT-  Home health OT, Outpatient OT, SLP-  Projected Equipment Needs: PT-To be determined, OT- To be determined, SLP-  Equipment Details: PT- , OT-  Patient/family involved in discharge planning: PT- Patient, Family member/caregiver,  OT-Patient, Family member/caregiver, SLP-   MD ELOS: 10-12 Medical Rehab Prognosis:  Good Assessment: The patient has been admitted for CIR therapies with the diagnosis of bilateral  cerebellar infarct, right more than left secondary large vessel disease from severe posterior circulation stenosis . The team will be addressing functional mobility, strength, stamina, balance,  safety, adaptive techniques and equipment, self-care, bowel and bladder mgt, patient and caregiver education. Goals have been set at min A. Anticipated discharge destination is home.        See Team Conference Notes for weekly updates to the plan of care

## 2023-09-08 NOTE — Progress Notes (Addendum)
 PROGRESS NOTE   Subjective/Complaints: Continues to have occasional R wrist pain.  No new concerns.   ROS: +right wrist pain, +dizziness Denies constipation, HA, SOB, CP  Objective:   No results found.  Recent Labs    09/07/23 0647 09/08/23 0600  WBC 5.8 5.8  HGB 10.6* 10.6*  HCT 32.3* 31.5*  PLT 183 219   Recent Labs    09/06/23 0542 09/07/23 0647  NA 136 138  K 4.0 3.5  CL 100 102  CO2 24 28  GLUCOSE 147* 101*  BUN 49* 50*  CREATININE 2.80* 2.45*  CALCIUM  8.9 8.8*    Intake/Output Summary (Last 24 hours) at 09/08/2023 1029 Last data filed at 09/08/2023 0732 Gross per 24 hour  Intake 0 ml  Output 1350 ml  Net -1350 ml        Physical Exam: Vital Signs Blood pressure 120/71, pulse 70, temperature 98.4 F (36.9 C), resp. rate 17, height 5\' 7"  (1.702 m), weight 98.5 kg, SpO2 96%.  Gen: no distress, normal appearing, sitting in chair, appears comfortable  HEENT: oral mucosa pink and moist, NCAT Cardio: Reg rate Chest: CTAB, normal effort, normal rate of breathing Abd: soft, non-distended, NT Ext: R arm swelling GU: clear yellow urine in bag Psych: pleasant, normal affect Skin: intact  Neuro: dysarthia present, oriented to person, month, year  MOTOR: LUE: 4/5 Deltoid, 4/5 Biceps, 4/5 Triceps,4/5 Grip RUE: 3/5 Deltoid, 3/5 Biceps, 3/5 Triceps, 2/5 Grip   LLE: HF 4/5, KE 4/5, ADF 4/5, APF 4/5 RLE: HF 4/5, KE 4/5, ADF 4/5, APF 4/5   SENSORY: Normal to touch all 4 extremities   Coordination: Normal finger to nose LUE   No hypertonia noted   MSK: R wrist ttp, wearing wrist brace dizziness caused by cervical spine extension- not checked today   Assessment/Plan: 1. Functional deficits which require 3+ hours per day of interdisciplinary therapy in a comprehensive inpatient rehab setting. Physiatrist is providing close team supervision and 24 hour management of active medical problems listed  below. Physiatrist and rehab team continue to assess barriers to discharge/monitor patient progress toward functional and medical goals  Care Tool:  Bathing    Body parts bathed by patient: Right arm, Chest, Abdomen, Front perineal area, Right upper leg, Left upper leg, Face   Body parts bathed by helper: Left arm, Right lower leg, Left lower leg, Buttocks     Bathing assist Assist Level: Moderate Assistance - Patient 50 - 74%     Upper Body Dressing/Undressing Upper body dressing   What is the patient wearing?: Pull over shirt    Upper body assist Assist Level: Minimal Assistance - Patient > 75%    Lower Body Dressing/Undressing Lower body dressing      What is the patient wearing?: Incontinence brief, Pants     Lower body assist Assist for lower body dressing: Maximal Assistance - Patient 25 - 49%     Toileting Toileting Toileting Activity did not occur (Clothing management and hygiene only): N/A (no void or bm)  Toileting assist Assist for toileting: Total Assistance - Patient < 25%     Transfers Chair/bed transfer  Transfers assist     Chair/bed  transfer assist level: Moderate Assistance - Patient 50 - 74%     Locomotion Ambulation   Ambulation assist      Assist level: Moderate Assistance - Patient 50 - 74% Assistive device: Hand held assist Max distance: 21ft   Walk 10 feet activity   Assist     Assist level: Moderate Assistance - Patient - 50 - 74% Assistive device: Hand held assist   Walk 50 feet activity   Assist    Assist level: Minimal Assistance - Patient > 75% Assistive device: Walker-Eva    Walk 150 feet activity   Assist    Assist level: Minimal Assistance - Patient > 75% Assistive device: Walker-Eva    Walk 10 feet on uneven surface  activity   Assist Walk 10 feet on uneven surfaces activity did not occur: Safety/medical concerns         Wheelchair     Assist Is the patient using a wheelchair?:  Yes Type of Wheelchair: Manual    Wheelchair assist level: Dependent - Patient 0%      Wheelchair 50 feet with 2 turns activity    Assist        Assist Level: Dependent - Patient 0%   Wheelchair 150 feet activity     Assist      Assist Level: Dependent - Patient 0%   Blood pressure 120/71, pulse 70, temperature 98.4 F (36.9 C), resp. rate 17, height 5\' 7"  (1.702 m), weight 98.5 kg, SpO2 96%.    Medical Problem List and Plan: 1. Functional deficits secondary to bilateral cerebellar infarct, right more than left secondary large vessel disease from severe posterior circulation stenosis             -patient may shower             -ELOS/Goals: 10-12,PT/OT sup to mod I             -Grounds pass ordered 2.  Antithrombotics: -DVT/anticoagulation:  Pharmaceutical: Lovenox              -antiplatelet therapy: Aspirin  81 mg daily and Plavix  75 mg day x 3 months then Plavix  alone 3. Pain Management: Neurontin  300 mg twice daily 4. Mood/Behavior/Sleep: Provide emotional support             -antipsychotic agents: N/A 5. Neuropsych/cognition: This patient is capable of making decisions on his own behalf. 6. Skin/Wound Care: Routine skin checks 7. Fluids/Electrolytes/Nutrition: Routine in and outs with follow-up chemistries 8.  Klebsiella oxytoca UTI.  Completing course of Duricef 9.  CKD stage IV.  Discussed that creatinine is improving 10.  Chronic combined systolic and diastolic congestive heart failure. EF 25-30% Monitor for any signs of fluid overload.  Follow-up cardiology services.  Continue Demadex  60 mg twice daily, weight reviewed and is increased, daily weights ordered, weights are stable/increased, continue current regimen  5/17 wt trending up although suspect may be inaccurate, no signs of overload noted   Filed Weights   09/06/23 0329 09/07/23 0500 09/08/23 0604  Weight: 95.4 kg 95.6 kg 98.5 kg     11.  Nonischemic cardiomyopathy.  Followed by cardiology  service Dr. Micael Adas.  Continue Imdur  30 mg daily and Coreg  6.25 mg twice daily.  12.  History of prostate cancer, BPH.  Followed by Dr. Freddi Jaeger.  Flomax  0.4 mg twice daily             -Check PVR/bladder scan  5/17 he has foley in placed   13.  Diabetes mellitus with  peripheral neuropathy and hemoglobin A1c 7.0.  Glucotrol  5 mg daily  CBG (last 3)  Recent Labs    09/07/23 1703 09/07/23 2120 09/08/23 0605  GLUCAP 200* 169* 92     14. HTN. Long term goal normotensive. Home meds carvedilol , imdur , toresemide  -Controlled, monitor    09/08/2023    6:04 AM 09/07/2023   11:15 PM 09/07/2023    7:47 PM  Vitals with BMI  Weight 217 lbs 2 oz    BMI 34    Systolic 120  123  Diastolic 71  73  Pulse 70 70 69     15. Obesity. Body mass index is 33.67 kg/m.             -dietary education  16. R wrist pain due to fall. Wearing wrist brace. Xray without fracture. Voltaren  gel scheduled             -tylenol  prn  -5/17 Will check vas US  due to R arm swelling  17. Constipation. Miralax  daily, senokot, encouraged prune juice  18. Dizziness: vestibular eval ordered  19. Anemia: stool occult ordered, repeat CBC ordered for tomorrow  -HGB stable 10.6  Addendum Thterapy reports he was a little slower today, possibly fatigue related, BMP/CBC, consider UA  Daughter reports his dysarthria increased a few days ago, didn't notice any major change today. Does seem to have slower speech then last time a saw him, diffusely a little weaker.  Discussed with neurology.  Will order MRI brain for further evaluation. Possibly encephalopathy. Add TSH and ammonia, B12 ordered for tomorrow labs Will hold demodex for now- BUN a little higher then usual  LOS: 3 days A FACE TO FACE EVALUATION WAS PERFORMED  Lylia Sand 09/08/2023, 10:29 AM

## 2023-09-08 NOTE — Progress Notes (Addendum)
 Occupational Therapy Session Note  Patient Details  Name: Allen Dyer MRN: [id number] Date of Birth: 1935-11-21  Today's Date: 09/09/2023 OT Individual Time:  - 10:45-1200                                          75 Mins   Short Term Goals: Week 1:  OT Short Term Goal 1 (Week 1): Patient will transfer to shower with min assist OT Short Term Goal 2 (Week 1): Patient will transfer to toilet with min assist OT Short Term Goal 3 (Week 1): Patient will dress upper body with set up assist OT Short Term Goal 4 (Week 1): Patient will dress lower body with mod assist OT Short Term Goal 5 (Week 1): Patient will report pain in right wrist reduced to 4/10 following gentle range/ mobilization  Skilled Therapeutic Interventions/Progress Updates:    Patient seated at w/c LOF. [name] indicated that he rested well on last night with a pain response associated with the R wrist of a 6 on a 0-10. The pt went on to say that his pain response was addressed by nursing. The pt began the session completing  manual manipulation of the R scapular and surrounding structures was completed.  The pt tolerated  the R hand and wrist being immersed in a cold water basin to address his edema.  Retrograde massage was completed on the pt R wrist and hand followed by PROM/AROM to improve mobility lof the extremity as a prime mover.  The pt went on to complete a ball reach exercise by extending bilateral hands forward with a ball in place  and passing the ball to me at about 70 degrees of shld flexion 10 x.  At the end of the session, the pt's RUE was positioned on a pillow with attention to elevating his hand and wrist to reduce the edema.  Prior to leaving the room, the call light and bed side table were place within reach with all additional needs addressed.   Therapy Documentation Precautions:  Precautions Precautions: Fall, Other (comment) (DNR) Precaution/Restrictions Comments: Urinary incontinence, DNR Required Braces  or Orthoses: Splint/Cast Splint/Cast: R wrist cock up splint Restrictions Weight Bearing Restrictions Per Provider Order: No Other Position/Activity Restrictions: as tolerates  Therapy/Group: Individual Therapy  Moises Ang 09/09/2023, 7:51 AM

## 2023-09-08 NOTE — Progress Notes (Signed)
 Physical Therapy Session Note  Patient Details  Name: Allen Dyer MRN: [id number] Date of Birth: October 07, 1935  Today's Date: 09/08/2023 PT Individual Time: 0903- 1005; 1440-1540 PT Individual Time Calculation (min): Session1: 62 min; Session2: 60 min  Short Term Goals: Week 1:  PT Short Term Goal 1 (Week 1): Pt will complete bed mobility with minA PT Short Term Goal 2 (Week 1): Pt will complete bed<>chair transfers with minA PT Short Term Goal 3 (Week 1): pt will ambulate 14ft with minA and LRAD PT Short Term Goal 4 (Week 1): Pt will navigate up/down x4 stairs with modA  Skilled Therapeutic Interventions/Progress Updates:    Session1: Patient in supine and reports not comfortable as scooted down in bed.  Applied knee hi TEDs and slipper socks total A and pt rolled to R to reach rail with S, side to sit with mod A and increased time.  Patient educated on vestibular exercises as relates dizziness 5/10 with side to sit this am.  Educated better to perform after already up and moving.  Educated on squat pivot transfers pt performed to L with min to mod A.  Patient in wheelchair assisted to therapy gym total A for time.  Patient sit to stand to [facility] walker with mod A and cues for forward gaze, relates notes "two signs" with double vision feels is new today.  Patient incontinent of bowel, but ambulated 150' to room with tech following with wheelchair frequent mod cues for forward gaze esp with turns, for head up and hips forward and for foot clearance. Assisted for clothing management and increased time for stand to sit with mod A on 3:1 over toilet.  Assisted for hygiene with mod A for sit to stand and to don clean brief and pants.  Ambulated to wheelchair with Marlea Silvers walker and left with call bell and needs in reach.    Session2:  Patient in room in wheelchair with family present.  Assisted in wheelchair to ortho gym and pt squat pivot to mat with mod A.  Sit to supine max A for legs, trunk positioning.   Assisted for R roll with mod to max A.  Noted ageotropic nystagmus lasting about 30 sec.  Side to sit with mod A and increased time.  Lean test for laterality noted R beat nystagmus.  Educated on Gufani repositioning for R horizontal canal cupulolithiasis.  Completed maneuver with A.  Patient performed habituation with seated R head turns x 5 with mod cues.  Assisted sit to stand to wheelchair with max A increased time and increased ataxia noted.  In wheelchair assisted to room and using RW unable to stand with +1 A so NT in to help to stand and step with RW to recliner with increased time and continued ataxic tremor.  Patient left with call bell in reach, chair alarm active and family in the room.  MD made aware pt c/o R side of tongue numb, more dysarthria and increased assist with mobility.   Therapy Documentation Precautions:  Precautions Precautions: Fall, Other (comment) (DNR) Precaution/Restrictions Comments: Urinary incontinence, DNR Required Braces or Orthoses: Splint/Cast Splint/Cast: R wrist cock up splint Restrictions Weight Bearing Restrictions Per Provider Order: No Other Position/Activity Restrictions: as tolerates  Pain: Pain Assessment Pain Scale: 0-10 Pain Score: 3  Pain Type: Acute pain Pain Location: Wrist Pain Orientation: Right Multiple Pain Sites: Yes 2nd Pain Site Pain Score: 2 Pain Location: Wrist Pain Orientation: Left Pain Onset: On-going Pain Intervention(s): Ambulation/increased activity     Therapy/Group:  Individual Therapy  Marley Simmers 09/08/2023, 9:30 AM  Abigail Hoff, PT Acute Rehabilitation Services [phone] 09/08/2023

## 2023-09-09 DIAGNOSIS — E1169 Type 2 diabetes mellitus with other specified complication: Secondary | ICD-10-CM

## 2023-09-09 LAB — CBC
HCT: 31.2 % — ABNORMAL LOW (ref 39.0–52.0)
Hemoglobin: 10.3 g/dL — ABNORMAL LOW (ref 13.0–17.0)
MCH: 32.5 pg (ref 26.0–34.0)
MCHC: 33 g/dL (ref 30.0–36.0)
MCV: 98.4 fL (ref 80.0–100.0)
Platelets: 251 10*3/uL (ref 150–400)
RBC: 3.17 MIL/uL — ABNORMAL LOW (ref 4.22–5.81)
RDW: 13 % (ref 11.5–15.5)
WBC: 6 10*3/uL (ref 4.0–10.5)
nRBC: 0 % (ref 0.0–0.2)

## 2023-09-09 LAB — BASIC METABOLIC PANEL WITH GFR
Anion gap: 11 (ref 5–15)
BUN: 48 mg/dL — ABNORMAL HIGH (ref 8–23)
CO2: 26 mmol/L (ref 22–32)
Calcium: 9 mg/dL (ref 8.9–10.3)
Chloride: 99 mmol/L (ref 98–111)
Creatinine, Ser: 2.32 mg/dL — ABNORMAL HIGH (ref 0.61–1.24)
GFR, Estimated: 27 mL/min — ABNORMAL LOW (ref >60)
Glucose, Bld: 135 mg/dL — ABNORMAL HIGH (ref 70–99)
Potassium: 3.7 mmol/L (ref 3.5–5.1)
Sodium: 136 mmol/L (ref 135–145)

## 2023-09-09 LAB — GLUCOSE, CAPILLARY
Glucose-Capillary: 130 mg/dL — ABNORMAL HIGH (ref 70–99)
Glucose-Capillary: 146 mg/dL — ABNORMAL HIGH (ref 70–99)
Glucose-Capillary: 197 mg/dL — ABNORMAL HIGH (ref 70–99)
Glucose-Capillary: 170 mg/dL — ABNORMAL HIGH (ref 70–99)

## 2023-09-09 LAB — TSH: TSH: 2.615 u[IU]/mL (ref 0.350–4.500)

## 2023-09-09 LAB — AMMONIA: Ammonia: 15 umol/L (ref 9–35)

## 2023-09-09 LAB — VITAMIN B12: Vitamin B-12: 883 pg/mL (ref 180–914)

## 2023-09-09 MED ORDER — ESCITALOPRAM OXALATE 10 MG PO TABS
10.0000 mg | ORAL_TABLET | Freq: Every day | ORAL | Status: DC
  Administered 2023-09-09: 10 mg via ORAL
  Filled 2023-09-09 (×2): qty 1

## 2023-09-09 MED ORDER — TORSEMIDE 20 MG PO TABS
60.0000 mg | ORAL_TABLET | Freq: Every day | ORAL | Status: DC
  Administered 2023-09-10: 60 mg via ORAL
  Filled 2023-09-09: qty 3

## 2023-09-09 MED ORDER — CARVEDILOL 3.125 MG PO TABS
3.1250 mg | ORAL_TABLET | Freq: Two times a day (BID) | ORAL | Status: DC
  Administered 2023-09-09 – 2023-09-27 (×35): 3.125 mg via ORAL
  Filled 2023-09-09 (×36): qty 1

## 2023-09-09 MED ORDER — ISOSORBIDE MONONITRATE ER 30 MG PO TB24
15.0000 mg | ORAL_TABLET | Freq: Every morning | ORAL | Status: DC
  Administered 2023-09-11 – 2023-09-27 (×17): 15 mg via ORAL
  Filled 2023-09-09 (×19): qty 1

## 2023-09-09 NOTE — Progress Notes (Addendum)
 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  ROS: +right wrist pain, +dizziness Denies constipation, HA, SOB, CP  Objective:   MR BRAIN WO CONTRAST Addendum Date: 09/09/2023 ADDENDUM REPORT: 09/09/2023 07:52 ADDENDUM: Study discussed by telephone with Dr. Bonnita Buttner, Neurology on 09/09/2023 at 07:49 . We discussed that when compared to 09/02/2023 the progressive and strikingly symmetric signal changes in the bilateral cerebellar peduncles now are most consistent with developing Wallerian degeneration in response to the bilateral cerebellar infarcts about a week ago. Those primary infarcts have partially faded now. Early middle cerebellar peduncle signal changes were present initially. No evidence of associated hemorrhage, no mass effect. 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 No hemorrhage or mass effect. Electronically Signed   By: Marlise Simpers M.D.   On: 09/09/2023 07:52   Result Date: 09/09/2023 CLINICAL DATA:  Neuro deficit, acute, stroke suspected EXAM: MRI HEAD WITHOUT CONTRAST TECHNIQUE: Multiplanar, multiecho pulse sequences of the brain and surrounding structures were obtained without intravenous contrast. COMPARISON:  CTA Head/neck Sep 02, 2023. FINDINGS: Brain: Acute infarcts in bilateral cerebellum and middle cerebellar peduncles. Associated edema without significant mass effect. Remote bilateral cerebellar infarcts. Remote infarct in the posterior limb right internal capsule. Patchy T2/FLAIR hyperintensities the white matter, compatible chronic microvascular disease. No evidence of midline shift, acute hemorrhage, mass lesion or hydrocephalus. Vascular:  Better evaluated on same day CTA. Skull and upper cervical spine: Normal marrow signal. Sinuses/Orbits: Negative. Other: No mastoid effusions. IMPRESSION: Acute infarcts in bilateral cerebellum and middle cerebellar peduncles. Electronically Signed: By: Stevenson Elbe M.D. On: 09/08/2023 23:54    Recent Labs    09/08/23 0600 09/08/23 1609  WBC 5.8 6.9  HGB 10.6* 10.9*  HCT 31.5* 33.6*  PLT 219 221   Recent Labs    09/07/23 0647 09/08/23 1609  NA 138 133*  K 3.5 4.0  CL 102 95*  CO2 28 27  GLUCOSE 101* 301*  BUN 50* 50*  CREATININE 2.45* 2.47*  CALCIUM  8.8* 9.0    Intake/Output Summary (Last 24 hours) at 09/09/2023 1137 Last data filed at 09/09/2023 0747 Gross per 24 hour  Intake 472 ml  Output 2375 ml  Net -1903 ml        Physical Exam: Vital Signs Blood pressure (!) 113/58, pulse 69, temperature 97.8 F (36.6 C), temperature source Oral, resp. rate 18, height 5\' 7"  (1.702 m), weight 94.9 kg, SpO2 97%.  Gen: no distress, normal appearing, sitting in chair, appears comfortable  HEENT: oral mucosa pink and moist, NCAT Cardio: Reg rate Chest: CTAB, normal effort, normal rate of breathing Abd: soft, non-distended, NT Ext: R arm swelling GU: clear yellow urine in bag Psych: pleasant, normal affect Skin: intact  Neuro: dysarthia present, oriented to person, month, year  MOTOR: LUE: 4/5 Deltoid, 4/5 Biceps, 4/5 Triceps,4/5 Grip RUE: 3/5 Deltoid, 3/5 Biceps, 3/5 Triceps, 2/5 Grip   LLE: HF 4/5, KE 4/5, ADF 4/5, APF 4/5 RLE: HF 4/5, KE 4/5, ADF 4/5, APF 4/5   SENSORY: Normal to  touch all 4 extremities   Coordination: Normal finger to nose LUE   No hypertonia noted   MSK: R wrist ttp, wearing wrist brace dizziness caused by cervical spine extension- not checked today   Assessment/Plan: 1. Functional deficits which require 3+ hours per day of interdisciplinary therapy in a comprehensive inpatient rehab setting. Physiatrist is providing close team  supervision and 24 hour management of active medical problems listed below. Physiatrist and rehab team continue to assess barriers to discharge/monitor patient progress toward functional and medical goals  Care Tool:  Bathing    Body parts bathed by patient: Right arm, Chest, Abdomen, Front perineal area, Right upper leg, Left upper leg, Face   Body parts bathed by helper: Left arm, Right lower leg, Left lower leg, Buttocks     Bathing assist Assist Level: Moderate Assistance - Patient 50 - 74%     Upper Body Dressing/Undressing Upper body dressing   What is the patient wearing?: Pull over shirt    Upper body assist Assist Level: Minimal Assistance - Patient > 75%    Lower Body Dressing/Undressing Lower body dressing      What is the patient wearing?: Incontinence brief, Pants     Lower body assist Assist for lower body dressing: Maximal Assistance - Patient 25 - 49%     Toileting Toileting Toileting Activity did not occur (Clothing management and hygiene only): N/A (no void or bm)  Toileting assist Assist for toileting: Total Assistance - Patient < 25%     Transfers Chair/bed transfer  Transfers assist     Chair/bed transfer assist level: Moderate Assistance - Patient 50 - 74%     Locomotion Ambulation   Ambulation assist      Assist level: Moderate Assistance - Patient 50 - 74% Assistive device: Hand held assist Max distance: 31ft   Walk 10 feet activity   Assist     Assist level: Moderate Assistance - Patient - 50 - 74% Assistive device: Hand held assist   Walk 50 feet activity   Assist    Assist level: Minimal Assistance - Patient > 75% Assistive device: Walker-Eva    Walk 150 feet activity   Assist    Assist level: Minimal Assistance - Patient > 75% Assistive device: Walker-Eva    Walk 10 feet on uneven surface  activity   Assist Walk 10 feet on uneven surfaces activity did not occur: Safety/medical concerns          Wheelchair     Assist Is the patient using a wheelchair?: Yes Type of Wheelchair: Manual    Wheelchair assist level: Dependent - Patient 0%      Wheelchair 50 feet with 2 turns activity    Assist        Assist Level: Dependent - Patient 0%   Wheelchair 150 feet activity     Assist      Assist Level: Dependent - Patient 0%   Blood pressure (!) 113/58, pulse 69, temperature 97.8 F (36.6 C), temperature source Oral, resp. rate 18, height 5\' 7"  (1.702 m), weight 94.9 kg, SpO2 97%.    Medical Problem List and Plan: 1. Functional deficits secondary to bilateral cerebellar infarct, right more than left secondary large vessel disease from severe posterior circulation stenosis             -patient may shower             -ELOS/Goals: 10-12,PT/OT sup to mod I             -  Grounds pass ordered  -MRI repeat 5/17, was discussed with neurology Dr. Bonnita Buttner , not acute cva but some progression of prior strokes. No additional changes, avoid hypotension   -Consult SLP- worsened dysarthria over past few days 2.  Antithrombotics: -DVT/anticoagulation:  Pharmaceutical: Lovenox              -antiplatelet therapy: Aspirin  81 mg daily and Plavix  75 mg day x 3 months then Plavix  alone 3. Pain Management: Neurontin  300 mg twice daily 4. Mood/Behavior/Sleep: Provide emotional support             -antipsychotic agents: N/A 5. Neuropsych/cognition: This patient is capable of making decisions on his own behalf.  -5/18 start lexapro for mood, consider neuropsych consult 6. Skin/Wound Care: Routine skin checks 7. Fluids/Electrolytes/Nutrition: Routine in and outs with follow-up chemistries 8.  Klebsiella oxytoca UTI.  Completing course of Duricef 9.  CKD stage IV.  Discussed that creatinine is improving  -Cr overalls stable 2.47/BUN 50- monitor 10.  Chronic combined systolic and diastolic congestive heart failure. EF 25-30% Monitor for any signs of fluid overload.  Follow-up cardiology  services.  Continue Demadex  60 mg twice daily, weight reviewed and is increased, daily weights ordered, weights are stable/increased, continue current regimen  5/17 wt trending up although suspect may be inaccurate, no signs of overload noted  5/18 Wt appears stable, monitor   Filed Weights   09/07/23 0500 09/08/23 0604 09/09/23 0500  Weight: 95.6 kg 98.5 kg 94.9 kg     11.  Nonischemic cardiomyopathy.  Followed by cardiology service Dr. Micael Adas.  Continue Imdur  30 mg daily and Coreg  6.25 mg twice daily.  12.  History of prostate cancer, BPH.  Followed by Dr. Freddi Jaeger.  Flomax  0.4 mg twice daily             -Check PVR/bladder scan  5/17-8 he has foley in placed, clear yellow urine  13.  Diabetes mellitus with peripheral neuropathy and hemoglobin A1c 7.0.  Glucotrol  5 mg daily  -5/18 few elevated CBGs, continue to monitor trend for now  CBG (last 3)  Recent Labs    09/08/23 1635 09/08/23 2116 09/09/23 0617  GLUCAP 314* 214* 130*     14. HTN. Long term goal normotensive. Home meds carvedilol , imdur , toresemide  -5/18 would like avoid hypotension, decrease Imdur  to 15 mg, decrease Coreg  to 3.125 twice daily, decrease torsemide  to daily Vitals:   09/09/23 0021 09/09/23 0503  BP:  (!) 113/58  Pulse: 65 69  Resp: 18 18  Temp:  97.8 F (36.6 C)  SpO2: 96% 97%      15. Obesity. Body mass index is 33.67 kg/m.             -dietary education  16. R wrist pain due to fall. Wearing wrist brace. Xray without fracture. Voltaren  gel scheduled             -tylenol  prn  -5/17 Will check vas US  due to R arm swelling  17. Constipation. Miralax  daily, senokot, encouraged prune juice  18. Dizziness: vestibular eval ordered  19. Anemia: stool occult ordered, repeat CBC ordered for tomorrow  -HGB stable 10.6    LOS: 4 days A FACE TO FACE EVALUATION WAS PERFORMED  Lylia Sand 09/09/2023, 11:37 AM

## 2023-09-09 NOTE — Progress Notes (Signed)
   09/09/23 0021  BiPAP/CPAP/SIPAP  $ Non-Invasive Home Ventilator  Subsequent  BiPAP/CPAP/SIPAP Pt Type Adult  BiPAP/CPAP/SIPAP Resmed  Mask Type Full face mask  Mask Size Medium  IPAP 18 cmH20  EPAP 6 cmH2O  FiO2 (%) 21 %  Patient Home Machine No  Patient Home Mask No  Patient Home Tubing No  CPAP/SIPAP surface wiped down Yes  Device Plugged into RED Power Outlet Yes  BiPAP/CPAP /SiPAP Vitals  Pulse Rate 65  Resp 18  SpO2 96 %  Bilateral Breath Sounds Clear  MEWS Score/Color  MEWS Score 0  MEWS Score Color Marrie Sizer

## 2023-09-09 NOTE — Progress Notes (Signed)
 MRI results called to [facility]. Patient back in room and resting. No changes from prior assessment

## 2023-09-10 ENCOUNTER — Other Ambulatory Visit: Payer: Self-pay

## 2023-09-10 ENCOUNTER — Inpatient Hospital Stay (HOSPITAL_COMMUNITY)

## 2023-09-10 DIAGNOSIS — M7989 Other specified soft tissue disorders: Secondary | ICD-10-CM | POA: Diagnosis not present

## 2023-09-10 LAB — GLUCOSE, CAPILLARY
Glucose-Capillary: 130 mg/dL — ABNORMAL HIGH (ref 70–99)
Glucose-Capillary: 152 mg/dL — ABNORMAL HIGH (ref 70–99)
Glucose-Capillary: 229 mg/dL — ABNORMAL HIGH (ref 70–99)
Glucose-Capillary: 221 mg/dL — ABNORMAL HIGH (ref 70–99)

## 2023-09-10 MED ORDER — TORSEMIDE 20 MG PO TABS
40.0000 mg | ORAL_TABLET | Freq: Every day | ORAL | Status: DC
  Administered 2023-09-11 – 2023-09-19 (×9): 40 mg via ORAL
  Filled 2023-09-10 (×9): qty 2

## 2023-09-10 MED ORDER — MECLIZINE HCL 25 MG PO TABS
12.5000 mg | ORAL_TABLET | Freq: Two times a day (BID) | ORAL | Status: DC | PRN
Start: 2023-09-10 — End: 2023-09-11

## 2023-09-10 NOTE — Progress Notes (Signed)
 Occupational Therapy Session Note  Patient Details  Name: Allen Dyer MRN: [id number] Date of Birth: 05-09-1935  Today's Date: 09/10/2023 OT Individual Time: 6962-9528 OT Individual Time Calculation (min): 42 min    Short Term Goals: Week 1:  OT Short Term Goal 1 (Week 1): Patient will transfer to shower with min assist OT Short Term Goal 2 (Week 1): Patient will transfer to toilet with min assist OT Short Term Goal 3 (Week 1): Patient will dress upper body with set up assist OT Short Term Goal 4 (Week 1): Patient will dress lower body with mod assist OT Short Term Goal 5 (Week 1): Patient will report pain in right wrist reduced to 4/10 following gentle range/ mobilization  Skilled Therapeutic Interventions/Progress Updates:    Patient received supine in bed, sleeping soundly.  Difficult to fully arouse.  Speech initially, very dysarthric, slurred - difficult to understand, although content seemed accurate.  Vitals stable, Doctor completing rounds - checked patient during this session.  Patient states "my whole right face is numb, " multiple times throughout session.  Patient with focal right weakness in proximal RUE.  Patient not wearing right wrist brace, and preferred to not have it on. Patient wants to get out of bed, however needing mod assist to roll to side and transition to sitting at edge of bed.  Patient with significant uncontrolled strong rocking pattern in sitting.  Unable to stop movement, but aware - "why am I rocking?"  Patient would stop ataxic movement in trunk with deep proprioceptive input into hips from shoulders.  Patient needed max assist for stand pivot transfer to wheelchair.  Patient's nurse in to deliver medications - direct handoff to nurse.    Therapy Documentation Precautions:  Precautions Precautions: Fall, Other (comment) (DNR) Precaution/Restrictions Comments: Urinary incontinence, DNR Required Braces or Orthoses: Splint/Cast Splint/Cast: R wrist cock up  splint Restrictions Weight Bearing Restrictions Per Provider Order: No Other Position/Activity Restrictions: as tolerates  Vitals: BP:  121/61 Pulse:  73 O2:  100% Pain: Denies pain    Therapy/Group: Individual Therapy  Hyla Coard M 09/10/2023, 12:16 PM

## 2023-09-10 NOTE — Progress Notes (Signed)
 VASCULAR LAB    Right upper extremity venous duplex has been performed.  See CV proc for preliminary results.  Relayed results verbally to Jean Michaelis, PA-C Charizma Gardiner, RVT 09/10/2023, 12:56 PM

## 2023-09-10 NOTE — Progress Notes (Signed)
 PROGRESS NOTE   Subjective/Complaints: Blood sugars well-controlled, vital stable overnight.  No complaints of pain. Ammonia yesterday 15, within normal limits.  Other labs stable  NA, K, BUN/creatinine.  Stable CBC.  Therapies reporting worsening dysarthria and new R facial numbness today, along with worsening truncal ataxia; CT head negative for acute infarct.  Vascular US  negative for DVT RUE but describing ?abscesses/cystic structures in arm, recommending CT.  ROS: +right wrist pain, +dizziness--ongoing Denies constipation, HA, SOB, CP  Objective:   MR BRAIN WO CONTRAST Addendum Date: 09/09/2023 ADDENDUM REPORT: 09/09/2023 07:52 ADDENDUM: Study discussed by telephone with Dr. Bonnita Buttner, Neurology on 09/09/2023 at 07:49 . We discussed that when compared to 09/02/2023 the progressive and strikingly symmetric signal changes in the bilateral cerebellar peduncles now are most consistent with developing Wallerian degeneration in response to the bilateral cerebellar infarcts about a week ago. Those primary infarcts have partially faded now. Early middle cerebellar peduncle signal changes were present initially. No evidence of associated hemorrhage, no mass effect. 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 No hemorrhage or mass effect. Electronically Signed   By: Marlise Simpers M.D.   On: 09/09/2023 07:52   Result Date: 09/09/2023 CLINICAL DATA:  Neuro deficit, acute, stroke suspected EXAM: MRI HEAD WITHOUT CONTRAST TECHNIQUE: Multiplanar, multiecho pulse sequences of the brain and surrounding structures were obtained without intravenous contrast. COMPARISON:  CTA Head/neck Sep 02, 2023. FINDINGS: Brain: Acute infarcts in bilateral cerebellum and middle cerebellar peduncles. Associated edema without significant mass effect. Remote  bilateral cerebellar infarcts. Remote infarct in the posterior limb right internal capsule. Patchy T2/FLAIR hyperintensities the white matter, compatible chronic microvascular disease. No evidence of midline shift, acute hemorrhage, mass lesion or hydrocephalus. Vascular: Better evaluated on same day CTA. Skull and upper cervical spine: Normal marrow signal. Sinuses/Orbits: Negative. Other: No mastoid effusions. IMPRESSION: Acute infarcts in bilateral cerebellum and middle cerebellar peduncles. Electronically Signed: By: Stevenson Elbe M.D. On: 09/08/2023 23:54    Recent Labs    09/08/23 1609 09/09/23 1049  WBC 6.9 6.0  HGB 10.9* 10.3*  HCT 33.6* 31.2*  PLT 221 251   Recent Labs    09/08/23 1609 09/09/23 1049  NA 133* 136  K 4.0 3.7  CL 95* 99  CO2 27 26  GLUCOSE 301* 135*  BUN 50* 48*  CREATININE 2.47* 2.32*  CALCIUM  9.0 9.0    Intake/Output Summary (Last 24 hours) at 09/10/2023 0740 Last data filed at 09/10/2023 0515 Gross per 24 hour  Intake 472 ml  Output 1750 ml  Net -1278 ml        Physical Exam: Vital Signs Blood pressure 129/67, pulse 69, temperature 98.8 F (37.1 C), temperature source Axillary, resp. rate 18, height 5\' 7"  (1.702 m), weight 93.5 kg, SpO2 100%.  Gen: no distress, normal appearing, sitting in chair, appears comfortable  HEENT: oral mucosa pink and moist, NCAT.  Severe nystagmus and bilateral lateral gaze. Cardio: Reg rate Chest: CTAB, normal effort, normal rate of breathing Abd: soft, non-distended, NT Ext: R arm swelling GU: clear yellow urine in bag Psych: pleasant, normal affect Skin: intact; mild right upper  extremity swelling/edema  Neuro: dysarthia present--approximately 50% intelligible., oriented to person, month, year.  Can follow all simple commands.  MOTOR: LUE: 4/5 Deltoid, 4/5 Biceps, 4/5 Triceps,4/5 Grip RUE: 3/5 Deltoid, 3/5 Biceps, 3/5 Triceps, 2/5 Grip--unchanged   LLE: HF 4/5, KE 4/5, ADF 4/5, APF 4/5 RLE: HF 4-/5,  KE 4-/5, ADF 4/5, APF 4/5   SENSORY: Normal to touch all 4 extremities   Coordination: Normal finger to nose LUE   No tone or spasticity noted.  Cranial nerves grossly intact.   MSK: R wrist ttp, currently not wearing wrist brace     Assessment/Plan: 1. Functional deficits which require 3+ hours per day of interdisciplinary therapy in a comprehensive inpatient rehab setting. Physiatrist is providing close team supervision and 24 hour management of active medical problems listed below. Physiatrist and rehab team continue to assess barriers to discharge/monitor patient progress toward functional and medical goals  Care Tool:  Bathing    Body parts bathed by patient: Right arm, Chest, Abdomen, Front perineal area, Right upper leg, Left upper leg, Face   Body parts bathed by helper: Left arm, Right lower leg, Left lower leg, Buttocks     Bathing assist Assist Level: Moderate Assistance - Patient 50 - 74%     Upper Body Dressing/Undressing Upper body dressing   What is the patient wearing?: Pull over shirt    Upper body assist Assist Level: Minimal Assistance - Patient > 75%    Lower Body Dressing/Undressing Lower body dressing      What is the patient wearing?: Incontinence brief, Pants     Lower body assist Assist for lower body dressing: Maximal Assistance - Patient 25 - 49%     Toileting Toileting Toileting Activity did not occur (Clothing management and hygiene only): N/A (no void or bm)  Toileting assist Assist for toileting: Total Assistance - Patient < 25%     Transfers Chair/bed transfer  Transfers assist     Chair/bed transfer assist level: Moderate Assistance - Patient 50 - 74%     Locomotion Ambulation   Ambulation assist      Assist level: Moderate Assistance - Patient 50 - 74% Assistive device: Hand held assist Max distance: 54ft   Walk 10 feet activity   Assist     Assist level: Moderate Assistance - Patient - 50 -  74% Assistive device: Hand held assist   Walk 50 feet activity   Assist    Assist level: Minimal Assistance - Patient > 75% Assistive device: Walker-Eva    Walk 150 feet activity   Assist    Assist level: Minimal Assistance - Patient > 75% Assistive device: Walker-Eva    Walk 10 feet on uneven surface  activity   Assist Walk 10 feet on uneven surfaces activity did not occur: Safety/medical concerns         Wheelchair     Assist Is the patient using a wheelchair?: Yes Type of Wheelchair: Manual    Wheelchair assist level: Dependent - Patient 0%      Wheelchair 50 feet with 2 turns activity    Assist        Assist Level: Dependent - Patient 0%   Wheelchair 150 feet activity     Assist      Assist Level: Dependent - Patient 0%   Blood pressure 129/67, pulse 69, temperature 98.8 F (37.1 C), temperature source Axillary, resp. rate 18, height 5\' 7"  (1.702 m), weight 93.5 kg, SpO2 100%.    Medical Problem List  and Plan: 1. Functional deficits secondary to bilateral cerebellar infarct, right more than left secondary large vessel disease from severe posterior circulation stenosis             -patient may shower             -ELOS/Goals: 10-12,PT/OT sup to mod I             -Grounds pass ordered  -MRI repeat 5/17, was discussed with neurology Dr. Bonnita Buttner , not acute cva but some progression of prior strokes. No additional changes, avoid hypotension   -Consult SLP- worsened dysarthria over past few days  - 5-19: Multiple messages from therapist and nursing concerns regarding worsening dysarthria, ataxia, and right facial sensory loss.  No new focal deficits on exam, however symptoms did not improve with medication adjustments.  Stat CT head without acute findings.  No hemorrhagic transformation or mass effect.  2.  Antithrombotics: -DVT/anticoagulation:  Pharmaceutical: Lovenox              -antiplatelet therapy: Aspirin  81 mg daily and Plavix  75 mg  day x 3 months then Plavix  alone 3. Pain Management: Neurontin  300 mg twice daily--well-controlled 4. Mood/Behavior/Sleep: Provide emotional support             -antipsychotic agents: N/A 5. Neuropsych/cognition: This patient is capable of making decisions on his own behalf.  -5/18 start lexapro  for mood, consider neuropsych consult  5/19: DC lexapro  d/t worsening lethargy, dysarthria  6. Skin/Wound Care: Routine skin checks  5-19: Right upper extremity duplex ordered prior for swelling negative for DVT, however with multiple cystic structures concerning for abscess.  CT recommended for further assessment, order placed.  No current fevers or leukocytosis to indicate infection.  7. Fluids/Electrolytes/Nutrition: Routine in and outs with follow-up chemistries 8.  Klebsiella oxytoca UTI.  Completing course of Duricef  - Per urology note, is to continue on cefadroxil  1 tab p.o. nightly for CAUTI prophylaxis while Foley is in place  9.  CKD stage IV.  Discussed that creatinine is improving  -Cr overalls stable 2.47/BUN 50- monitor  5-19: BUN/creatinine stable. 10.  Chronic combined systolic and diastolic congestive heart failure. EF 25-30% Monitor for any signs of fluid overload.  Follow-up cardiology services.  Continue Demadex  60 mg twice daily, weight reviewed and is increased, daily weights ordered, weights are stable/increased, continue current regimen  5/17 wt trending up although suspect may be inaccurate, no signs of overload noted  5/18 Wt appears stable, monitor  5/19: weights downtrending, ? Stroke recrudescence with normotension d/t stenosis, reduce torsemide  to 60 mg daily  Filed Weights   09/08/23 0604 09/09/23 0500 09/10/23 0500  Weight: 98.5 kg 94.9 kg 93.5 kg     11.  Nonischemic cardiomyopathy.  Followed by cardiology service Dr. Micael Adas.  Continue Imdur  30 mg daily and Coreg  6.25 mg twice daily.  See #10.  12.  History of prostate cancer, BPH.  Followed by Dr. Freddi Jaeger.  Flomax   0.4 mg twice daily             -Check PVR/bladder scan  5/17-8 he has foley in placed, clear yellow urine  Foley placed 5/15 - 5 days prior to DC trial per urology  13.  Diabetes mellitus with peripheral neuropathy and hemoglobin A1c 7.0.  Glucotrol  5 mg daily  -5/18 few elevated CBGs, continue to monitor trend for now  5-19: BG is generally well-controlled, monitor  CBG (last 3)  Recent Labs    09/09/23 1639 09/09/23 2113 09/10/23 0544  GLUCAP 197* 170* 130*     14. HTN. Long term goal normotensive. Home meds carvedilol , imdur , toresemide  -5/18 would like avoid hypotension, decrease Imdur  to 15 mg, decrease Coreg  to 3.125 twice daily, decrease torsemide  to daily  5/19: Weight down, BP normotensive but may be underperfusing; reduce torsemide  as above. Can consider reducing flomax  as well pending DC foley trial but cannot stay off long term d/t chronic retention.  Vitals:   09/10/23 1417 09/10/23 2045  BP: 133/82 139/78  Pulse: 73 69  Resp: 15 18  Temp: 97.7 F (36.5 C) 98.5 F (36.9 C)  SpO2: 97% 96%      15. Obesity. Body mass index is 33.67 kg/m.             -dietary education  16. R wrist pain due to fall. Wearing wrist brace. Xray without fracture. Voltaren  gel scheduled             -tylenol  prn  -5/17 Will check vas US  due to R arm swelling  5-19: See above; no DVT, possible abscesses/cystic structures.  CT pending.  WBCs normal, no signs of infection.  17. Constipation. Miralax  daily, senokot, encouraged prune juice  Last bowel movement 5-17  18. Dizziness/vertigo/nystagmus: vestibular eval ordered   - 5/19: reduce meclizine  to 12.5 mg BID Prn d/t lethargy.  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  If no improvement, would consider addition of scopolamine patch  19. Anemia: stool occult ordered, repeat CBC ordered for tomorrow  -HGB stable 10.6--stable    LOS: 5 days A FACE TO FACE EVALUATION WAS PERFORMED  Bea Lime 09/10/2023,  7:40 AM

## 2023-09-10 NOTE — Progress Notes (Signed)
 Physical Therapy Session Note  Patient Details  Name: Allen Dyer MRN: [id number] Date of Birth: 1935-09-07  Today's Date: 09/10/2023 PT Individual Time: 4098-1191 PT Individual Time Calculation (min): 72 min   Short Term Goals: Week 1:  PT Short Term Goal 1 (Week 1): Pt will complete bed mobility with minA PT Short Term Goal 2 (Week 1): Pt will complete bed<>chair transfers with minA PT Short Term Goal 3 (Week 1): pt will ambulate 141ft with minA and LRAD PT Short Term Goal 4 (Week 1): Pt will navigate up/down x4 stairs with modA  Skilled Therapeutic Interventions/Progress Updates:      Pt sitting up in wheelchair - agreeable to therapy treatment. He has no complaints of pain. Pt much more slurred and delayed compared to when he was last seen by this PT on Friday. Difficult to understand. Medical team is aware of change.   Patient transported to main gym at wheelchair level. BP checked while sitting: 136/76, HR 72. Pt having no symptoms.   Pt required totalA for squat pivot transfer from w/c to mat table, very rigid and ataxic. Sitting unsupported at [facility] with CGA with significant trunk rocking back and forth, unable to stop volitionally or with external assist. Applied weighted vest to try and help with proprioceptive feedback but limited improvement. Tried to work out of it with ball rolls while sitting and ball lifts while sitting but ataxia too significant. Patient lied down with maxA onto mat table to rest from the rocking. Worked on BUE movements and simple BLE exercises to help with strengthening and mobility. Pt with ataxia in UE > LE for open chained there-ex.   MaxA for supine<>sitting and +2 assist for stand pivot transfer back to his w/c. Patient with significant ataxia, difficulty coordinating legs to step during transfer. Used sliding board with+2 totalA for returning back to bed. NT made aware of recommendation for bed pan at this time due to safety concerns - updated safety  plan.   Pt incontinent of BM during session - NT made aware. Soft call bell and all needs met at end of session.     Therapy Documentation Precautions:  Precautions Precautions: Fall, Other (comment) (DNR) Precaution/Restrictions Comments: Urinary incontinence, DNR Required Braces or Orthoses: Splint/Cast Splint/Cast: R wrist cock up splint Restrictions Weight Bearing Restrictions Per Provider Order: No Other Position/Activity Restrictions: as tolerates General:   Vital Signs: Therapy Vitals Temp: 98.8 F (37.1 C) Temp Source: Axillary Pulse Rate: 69 Resp: 18 BP: 129/67 Patient Position (if appropriate): Lying Oxygen Therapy SpO2: 100 % O2 Device: Room Air Pain:   Mobility:   Locomotion :    Trunk/Postural Assessment :    Balance:   Exercises:   Other Treatments:      Therapy/Group: Individual Therapy  Ari Bernabei P Zafar Debrosse 09/10/2023, 7:45 AM

## 2023-09-10 NOTE — Progress Notes (Signed)
 Occupational Therapy Session Note  Patient Details  Name: Allen Dyer MRN: [id number] Date of Birth: [birth year]  Today's Date: 09/10/2023 OT Individual Time: 1306-1401 OT Individual Time Calculation (min): 55 min  OT Missed time: 20 min (Pt fatigue)  Short Term Goals: Week 1:  OT Short Term Goal 1 (Week 1): Patient will transfer to shower with min assist OT Short Term Goal 2 (Week 1): Patient will transfer to toilet with min assist OT Short Term Goal 3 (Week 1): Patient will dress upper body with set up assist OT Short Term Goal 4 (Week 1): Patient will dress lower body with mod assist OT Short Term Goal 5 (Week 1): Patient will report pain in right wrist reduced to 4/10 following gentle range/ mobilization  Skilled Therapeutic Interventions/Progress Updates:     Pt received sleeping in bed, waking upon OT arrival. Pt fatigued and sleepy reporting pain in R wrist, receptive to wearing wrist cock-up splint during session for pain management OT-offering intermittent rest breaks, repositioning, and therapeutic support to optimize participation in therapy session. Spoke with primary therapists prior to OT session with primary OT/PT reporting increased fatigue, dysarthric speech, and ataxia noted this AM. Repeat CT scans completed prior to OT session with PA, Genella Kendall, in/out during session to provide updates on scan results and provide education to both Pt and family with no new infarcts noted. Spent large amount of time during session providing education to both Pt and family on CVA etiology/recovery process and providing therapeutic support- Pt's family extremely supportive during session, however psychosocial impact of CVA is very significant on Pt and family with Pt expressing "my body just is not keeping up with my mind". Pt also expressed he has pushed himself very hard over the past few days- education provided on importance of sleep and balancing working hard with rest to allow his mind and body  to recover.  Pt noted to have had a BM in bed and receptive to getting cleaned up during session. D/t fatigue and increased ataxia during this session, toileting tasks completed bed level. Worked on bed mobility skills to decrease overall bourdon of care, facilitating WB'ing through B LEs, and crossing midline during toileting tasks. Pt able to roll to R with light mod A required to full come onto R side and max A required to roll to L side. Peri-care and clothing managing completed total A for energy conservation and time management- Pt's DTR participating in session with education provided on techniques for assisting with bed level dressing/toileting tasks. Ended session with Pt sitting up in bed and R UE supported on pillows for pain edema and pain management with call bell in reach, bed alarm on, and all needs met. Missed 20 minutes skilled OT treatment d/t Pt fatigue. Will attempt to make up time as Pt's status and schedule allow.   Therapy Documentation Precautions:  Precautions Precautions: Fall, Other (comment) (DNR) Precaution/Restrictions Comments: Urinary incontinence, DNR Required Braces or Orthoses: Splint/Cast Splint/Cast: R wrist cock up splint Restrictions Weight Bearing Restrictions Per Provider Order: No Other Position/Activity Restrictions: as tolerates   Therapy/Group: Individual Therapy  Geoffery Kiel 09/10/2023, 8:00 AM

## 2023-09-10 NOTE — Progress Notes (Signed)
 RUE dopplers negative but tech reported edema RUE with fluid collection right forearm and CT recommended for work up. CT RUE ordered and change to forearm/wrist per radiology recommendations as pain located to distal extremity.

## 2023-09-10 NOTE — Patient Outreach (Signed)
 Spoke to Sunnie England (daughter/caregiver/DPR) states Mr. Shell was hospitalized due to a stroke, he is currently in in-patient rehab. Staff will let them know on Wednesday of expected d/c date.

## 2023-09-11 ENCOUNTER — Inpatient Hospital Stay (HOSPITAL_COMMUNITY)

## 2023-09-11 LAB — GLUCOSE, CAPILLARY
Glucose-Capillary: 112 mg/dL — ABNORMAL HIGH (ref 70–99)
Glucose-Capillary: 238 mg/dL — ABNORMAL HIGH (ref 70–99)
Glucose-Capillary: 204 mg/dL — ABNORMAL HIGH (ref 70–99)
Glucose-Capillary: 126 mg/dL — ABNORMAL HIGH (ref 70–99)

## 2023-09-11 LAB — CBC WITH DIFFERENTIAL/PLATELET
Abs Immature Granulocytes: 0.04 10*3/uL (ref 0.00–0.07)
Basophils Absolute: 0 10*3/uL (ref 0.0–0.1)
Basophils Relative: 0 %
Eosinophils Absolute: 0.1 10*3/uL (ref 0.0–0.5)
Eosinophils Relative: 2 %
HCT: 37.7 % — ABNORMAL LOW (ref 39.0–52.0)
Hemoglobin: 12.3 g/dL — ABNORMAL LOW (ref 13.0–17.0)
Immature Granulocytes: 1 %
Lymphocytes Relative: 12 %
Lymphs Abs: 0.8 10*3/uL (ref 0.7–4.0)
MCH: 33.2 pg (ref 26.0–34.0)
MCHC: 32.6 g/dL (ref 30.0–36.0)
MCV: 101.6 fL — ABNORMAL HIGH (ref 80.0–100.0)
Monocytes Absolute: 0.6 10*3/uL (ref 0.1–1.0)
Monocytes Relative: 8 %
Neutro Abs: 5.5 10*3/uL (ref 1.7–7.7)
Neutrophils Relative %: 77 %
Platelets: 320 10*3/uL (ref 150–400)
RBC: 3.71 MIL/uL — ABNORMAL LOW (ref 4.22–5.81)
RDW: 12.9 % (ref 11.5–15.5)
WBC: 7 10*3/uL (ref 4.0–10.5)
nRBC: 0 % (ref 0.0–0.2)

## 2023-09-11 LAB — BASIC METABOLIC PANEL WITH GFR
Anion gap: 14 (ref 5–15)
BUN: 45 mg/dL — ABNORMAL HIGH (ref 8–23)
CO2: 23 mmol/L (ref 22–32)
Calcium: 9.7 mg/dL (ref 8.9–10.3)
Chloride: 100 mmol/L (ref 98–111)
Creatinine, Ser: 2.17 mg/dL — ABNORMAL HIGH (ref 0.61–1.24)
GFR, Estimated: 29 mL/min — ABNORMAL LOW (ref >60)
Glucose, Bld: 272 mg/dL — ABNORMAL HIGH (ref 70–99)
Potassium: 4.4 mmol/L (ref 3.5–5.1)
Sodium: 137 mmol/L (ref 135–145)

## 2023-09-11 MED ORDER — INSULIN GLARGINE-YFGN 100 UNIT/ML ~~LOC~~ SOLN
2.0000 [IU] | Freq: Every day | SUBCUTANEOUS | Status: DC
  Administered 2023-09-11 – 2023-09-12 (×2): 2 [IU] via SUBCUTANEOUS
  Filled 2023-09-11 (×3): qty 0.02

## 2023-09-11 MED ORDER — MECLIZINE HCL 25 MG PO TABS: 12.5000 mg | ORAL_TABLET | Freq: Every day | ORAL | Status: DC | PRN

## 2023-09-11 MED ORDER — INSULIN GLARGINE-YFGN 100 UNIT/ML ~~LOC~~ SOPN
2.0000 [IU] | PEN_INJECTOR | SUBCUTANEOUS | Status: DC
  Filled 2023-09-11: qty 3

## 2023-09-11 NOTE — Progress Notes (Addendum)
 PROGRESS NOTE   Subjective/Complaints: CT forearm and hand are pending Creatinine continues to improve Patient's chart reviewed- No issues reported overnight Vitals signs stable   ROS: +right wrist pain, +dizziness--ongoing Denies constipation, HA, SOB, CP, +RUE edema  Objective:   VAS US  UPPER EXTREMITY VENOUS DUPLEX Result Date: 09/10/2023 UPPER VENOUS STUDY  Patient Name:  Allen Dyer  Date of Exam:   09/10/2023 Medical Rec #: [id number]      Accession #:    [id number] Date of Birth: Mar 03, 1936      Patient Gender: M Patient Age:   88 years Exam Location:  [facility] Procedure:      VAS US  UPPER EXTREMITY VENOUS DUPLEX Referring Phys: Lylia Sand --------------------------------------------------------------------------------  Indications: Pain, and Swelling Limitations: Poor ultrasound/tissue interface and musculoskeletal features. Comparison Study: No prior study on file Performing Technologist: Carleene Chase RVS  Examination Guidelines: A complete evaluation includes B-mode imaging, spectral Doppler, color Doppler, and power Doppler as needed of all accessible portions of each vessel. Bilateral testing is considered an integral part of a complete examination. Limited examinations for reoccurring indications may be performed as noted.  Right Findings: +----------+------------+---------+-----------+----------+-------+ RIGHT     CompressiblePhasicitySpontaneousPropertiesSummary +----------+------------+---------+-----------+----------+-------+ IJV                      Yes       Yes                      +----------+------------+---------+-----------+----------+-------+ Subclavian               Yes       Yes                      +----------+------------+---------+-----------+----------+-------+ Axillary                 Yes       Yes                       +----------+------------+---------+-----------+----------+-------+ Brachial      Full                                          +----------+------------+---------+-----------+----------+-------+ Radial        Full       Yes       Yes                      +----------+------------+---------+-----------+----------+-------+ Ulnar         Full                                          +----------+------------+---------+-----------+----------+-------+ Cephalic      Full                                          +----------+------------+---------+-----------+----------+-------+  Basilic                  Yes       Yes                      +----------+------------+---------+-----------+----------+-------+  Left Findings: +----------+------------+---------+-----------+----------+-------+ LEFT      CompressiblePhasicitySpontaneousPropertiesSummary +----------+------------+---------+-----------+----------+-------+ Subclavian               Yes       Yes                      +----------+------------+---------+-----------+----------+-------+  Summary:  Right: No evidence of deep vein thrombosis in the upper extremity. No evidence of superficial vein thrombosis in the upper extremity. Tissue disturbance and subcutaneous edema noted throughout the right upper extremity.  Left: No evidence of thrombosis in the subclavian.  *See table(s) above for measurements and observations.  Diagnosing physician: Runell Countryman Electronically signed by Runell Countryman on 09/10/2023 at 3:44:44 PM.    Final    CT HEAD WO CONTRAST ( ) Result Date: 09/10/2023 CLINICAL DATA:  88 year old male with neurologic deficit. Bilateral cerebellar infarcts. Progressive signal abnormality in the cerebellar peduncles on MRI 2 days ago. Subsequent encounter. EXAM: CT HEAD WITHOUT CONTRAST TECHNIQUE: Contiguous axial images were obtained from the base of the skull through the vertex without intravenous contrast.  RADIATION DOSE REDUCTION: This exam was performed according to the departmental dose-optimization program which includes automated exposure control, adjustment of the mA and/or kV according to patient size and/or use of iterative reconstruction technique. COMPARISON:  Brain MRI 09/08/2023 and earlier. FINDINGS: Brain: Patchy, confluent hypodensity in the bilateral cerebellar hemispheres and cerebellar peduncles corresponding to recent MRI appearance. No hemorrhagic transformation. No posterior fossa mass effect. Basilar cisterns remain normal. No ventriculomegaly. Elsewhere the brainstem gray-white differentiation appears stable. Supratentorial gray-white differentiation is stable. Incidental dural calcification along the falx. No new cerebral edema identified. Vascular: Calcified atherosclerosis at the skull base. No suspicious intracranial vascular hyperdensity. Skull: Stable and intact. Sinuses/Orbits: Visualized paranasal sinuses and mastoids are stable and well aerated. Other: No acute orbit or scalp soft tissue finding. IMPRESSION: 1. Stable from recent MRI. Cerebellar and cerebellar peduncle infarct related edema with no hemorrhagic transformation or mass effect. 2. No new intracranial abnormality. Electronically Signed   By: Marlise Simpers M.D.   On: 09/10/2023 12:33    Recent Labs    09/08/23 1609 09/09/23 1049  WBC 6.9 6.0  HGB 10.9* 10.3*  HCT 33.6* 31.2*  PLT 221 251   Recent Labs    09/08/23 1609 09/09/23 1049  NA 133* 136  K 4.0 3.7  CL 95* 99  CO2 27 26  GLUCOSE 301* 135*  BUN 50* 48*  CREATININE 2.47* 2.32*  CALCIUM  9.0 9.0    Intake/Output Summary (Last 24 hours) at 09/11/2023 0931 Last data filed at 09/11/2023 0800 Gross per 24 hour  Intake 354 ml  Output 2400 ml  Net -2046 ml        Physical Exam: Vital Signs Blood pressure 132/77, pulse 62, temperature 98 F (36.7 C), resp. rate 18, height 5\' 7"  (1.702 m), weight 93.7 kg, SpO2 96%.  Gen: no distress, normal  appearing, sitting in chair, appears comfortable  HEENT: oral mucosa pink and moist, NCAT.  Severe nystagmus and bilateral lateral gaze. Cardio: Reg rate Chest: CTAB, normal effort, normal rate of breathing Abd: soft, non-distended, NT Ext: R arm swelling- continues GU: clear yellow urine in bag Psych: pleasant,  normal affect Skin: intact; mild right upper extremity swelling/edema  Neuro: dysarthia present--approximately 50% intelligible., increased confusion and decreased ability to follow commands noticed later in the day   No tone or spasticity noted.  Cranial nerves grossly intact.   MSK: R wrist ttp, currently not wearing wrist brace     Assessment/Plan: 1. Functional deficits which require 3+ hours per day of interdisciplinary therapy in a comprehensive inpatient rehab setting. Physiatrist is providing close team supervision and 24 hour management of active medical problems listed below. Physiatrist and rehab team continue to assess barriers to discharge/monitor patient progress toward functional and medical goals  Care Tool:  Bathing    Body parts bathed by patient: Right arm, Chest, Abdomen, Front perineal area, Right upper leg, Left upper leg, Face   Body parts bathed by helper: Left arm, Right lower leg, Left lower leg, Buttocks     Bathing assist Assist Level: Moderate Assistance - Patient 50 - 74%     Upper Body Dressing/Undressing Upper body dressing   What is the patient wearing?: Pull over shirt    Upper body assist Assist Level: Minimal Assistance - Patient > 75%    Lower Body Dressing/Undressing Lower body dressing      What is the patient wearing?: Incontinence brief, Pants     Lower body assist Assist for lower body dressing: Maximal Assistance - Patient 25 - 49%     Toileting Toileting Toileting Activity did not occur (Clothing management and hygiene only): N/A (no void or bm)  Toileting assist Assist for toileting: Total Assistance -  Patient < 25%     Transfers Chair/bed transfer  Transfers assist     Chair/bed transfer assist level: Moderate Assistance - Patient 50 - 74%     Locomotion Ambulation   Ambulation assist      Assist level: Moderate Assistance - Patient 50 - 74% Assistive device: Hand held assist Max distance: 33ft   Walk 10 feet activity   Assist     Assist level: Moderate Assistance - Patient - 50 - 74% Assistive device: Hand held assist   Walk 50 feet activity   Assist    Assist level: Minimal Assistance - Patient > 75% Assistive device: Walker-Eva    Walk 150 feet activity   Assist    Assist level: Minimal Assistance - Patient > 75% Assistive device: Walker-Eva    Walk 10 feet on uneven surface  activity   Assist Walk 10 feet on uneven surfaces activity did not occur: Safety/medical concerns         Wheelchair     Assist Is the patient using a wheelchair?: Yes Type of Wheelchair: Manual    Wheelchair assist level: Dependent - Patient 0%      Wheelchair 50 feet with 2 turns activity    Assist        Assist Level: Dependent - Patient 0%   Wheelchair 150 feet activity     Assist      Assist Level: Dependent - Patient 0%   Blood pressure 132/77, pulse 62, temperature 98 F (36.7 C), resp. rate 18, height 5\' 7"  (1.702 m), weight 93.7 kg, SpO2 96%.    Medical Problem List and Plan: 1. Functional deficits secondary to bilateral cerebellar infarct, right more than left secondary large vessel disease from severe posterior circulation stenosis             -patient may shower             -ELOS/Goals:  10-12,PT/OT sup to mod I             -Grounds pass ordered  -MRI repeat 5/17, was discussed with neurology Dr. Bonnita Buttner , not acute cva but some progression of prior strokes. No additional changes, avoid hypotension   -Consult SLP- worsened dysarthria over past few days  - 5-19: Multiple messages from therapist and nursing concerns regarding  worsening dysarthria, ataxia, and right facial sensory loss.  No new focal deficits on exam, however symptoms did not improve with medication adjustments.  Stat CT head without acute findings.  No hemorrhagic transformation or mass effect.  2.  Antithrombotics: -DVT/anticoagulation:  Pharmaceutical: Lovenox              -antiplatelet therapy: Aspirin  81 mg daily and Plavix  75 mg day x 3 months then Plavix  alone 3. Pain Management: Neurontin  300 mg twice daily--well-controlled 4. Mood/Behavior/Sleep: Provide emotional support             -antipsychotic agents: N/A 5. Neuropsych/cognition: This patient is capable of making decisions on his own behalf.  -5/18 start lexapro  for mood, consider neuropsych consult  5/19: DC lexapro  d/t worsening lethargy, dysarthria  6. Skin/Wound Care: Routine skin checks  5-19: Right upper extremity duplex ordered prior for swelling negative for DVT, however with multiple cystic structures concerning for abscess.  CT recommended for further assessment, order placed.  No current fevers or leukocytosis to indicate infection.  7. Fluids/Electrolytes/Nutrition: Routine in and outs with follow-up chemistries 8.  Klebsiella oxytoca UTI.  Completing course of Duricef  - Per urology note, is to continue on cefadroxil  1 tab p.o. nightly for CAUTI prophylaxis while Foley is in place  9.  CKD stage IV.  Discussed that creatinine is improving  -Cr overalls stable 2.47/BUN 50- monitor  5-19: BUN/creatinine stable. 10.  Chronic combined systolic and diastolic congestive heart failure. EF 25-30% Monitor for any signs of fluid overload.  Follow-up cardiology services.  Continue Demadex  60 mg twice daily, weight reviewed and is increased, daily weights ordered, weights are stable/increased, continue current regimen  5/17 wt trending up although suspect may be inaccurate, no signs of overload noted  5/18 Wt appears stable, monitor  5/19: weights downtrending, ? Stroke recrudescence  with normotension d/t stenosis, reduce torsemide  to 60 mg daily  Filed Weights   09/09/23 0500 09/10/23 0500 09/11/23 0500  Weight: 94.9 kg 93.5 kg 93.7 kg     11.  Nonischemic cardiomyopathy.  Followed by cardiology service Dr. Micael Adas.  Continue Imdur  30 mg daily and Coreg  6.25 mg twice daily.  See #10.  12.  History of prostate cancer, BPH.  Followed by Dr. Freddi Jaeger.  Flomax  0.4 mg twice daily             -Check PVR/bladder scan  5/17-8 he has foley in placed, clear yellow urine  Foley placed 5/15 - 5 days prior to DC trial per urology  13.  Diabetes mellitus with peripheral neuropathy and hemoglobin A1c 7.0.  Glucotrol  5 mg daily  -5/18 few elevated CBGs, continue to monitor trend for now  5-19: BG is generally well-controlled, monitor  CBG (last 3)  Recent Labs    09/10/23 1651 09/10/23 2101 09/11/23 0606  GLUCAP 229* 221* 112*     14. HTN. Long term goal normotensive. Home meds carvedilol , imdur , toresemide  -5/18 would like avoid hypotension, decrease Imdur  to 15 mg, decrease Coreg  to 3.125 twice daily, decrease torsemide  to daily  5/19: Weight down, BP normotensive but may be underperfusing;  reduce torsemide  as above. Can consider reducing flomax  as well pending DC foley trial but cannot stay off long term d/t chronic retention.  Vitals:   09/11/23 0032 09/11/23 0602  BP:  132/77  Pulse: 74 62  Resp: 18 18  Temp:  98 F (36.7 C)  SpO2: 95% 96%      15. Obesity. Body mass index is 33.67 kg/m.             -dietary education  16. R wrist pain due to fall. Wearing wrist brace. Xray without fracture. Voltaren  gel scheduled             -tylenol  prn  -5/17 Will check vas US  due to R arm swelling  5-19: See above; no DVT, possible abscesses/cystic structures.    5/20: CT reviewed and shows age indeterminate fracture, will consult ortho  17. Constipation. Miralax  daily, d/c Senakot, encouraged prune juice  Last bowel movement 5-19  18. Dizziness/vertigo/nystagmus:  vestibular eval ordered   Decrease meclizine  to daily given lethargy.  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  If no improvement, would consider addition of scopolamine patch  19. Anemia: stool occult ordered, repeat CBC tomorrow  20. AMS: increased confusion and decreased ability to follow commands noted later in the day, stat Head CT, Brain MRI, CBC, and BMP ordered    LOS: 6 days A FACE TO FACE EVALUATION WAS PERFORMED  Keven Pel Soleia Badolato 09/11/2023, 9:31 AM

## 2023-09-11 NOTE — Progress Notes (Signed)
 Orthopedic Tech Progress Note Patient Details:  Allen Dyer 05-01-1935 [id number]  Ortho Devices Type of Ortho Device: Ace wrap, Cotton web roll, Volar splint Ortho Device/Splint Location: RUE Ortho Device/Splint Interventions: Ordered, Application, Adjustmentremoved VELCRO WRIST BRACE, left on bedside table. Applied cotton to skin then applied VOLAR splint to patient   Post Interventions Patient Tolerated: Well Instructions Provided: Care of device  [name] 09/11/2023, 3:05 PM

## 2023-09-11 NOTE — Plan of Care (Signed)
  Problem: RH Swallowing Goal: LTG Patient will consume least restrictive diet using compensatory strategies with assistance (SLP) Description: LTG:  Patient will consume least restrictive diet using compensatory strategies with assistance (SLP) Flowsheets (Taken 09/11/2023 1222) LTG: Pt Patient will consume least restrictive diet using compensatory strategies with assistance of (SLP): Supervision   Problem: RH Cognition - SLP Goal: RH LTG Patient will demonstrate orientation with cues Description:  LTG:  Patient will demonstrate orientation to person/place/time/situation with cues (SLP)   Flowsheets (Taken 09/11/2023 1222) LTG Patient will demonstrate orientation to:  Person  Place  Time  Situation LTG: Patient will demonstrate orientation using cueing (SLP): Supervision   Problem: RH Expression Communication Goal: LTG Patient will increase speech intelligibility (SLP) Description: LTG: Patient will increase speech intelligibility at word/phrase/conversation level with cues, % of the time (SLP) Flowsheets (Taken 09/11/2023 1222) LTG: Patient will increase speech intelligibility (SLP): Minimal Assistance - Patient > 75% Level: Phrase   Problem: RH Problem Solving Goal: LTG Patient will demonstrate problem solving for (SLP) Description: LTG:  Patient will demonstrate problem solving for basic/complex daily situations with cues  (SLP) Flowsheets (Taken 09/11/2023 1222) LTG: Patient will demonstrate problem solving for (SLP): Basic daily situations LTG Patient will demonstrate problem solving for: Minimal Assistance - Patient > 75%   Problem: RH Memory Goal: LTG Patient will demonstrate ability for day to day (SLP) Description: LTG:   Patient will demonstrate ability for day to day recall/carryover during cognitive/linguistic activities with assist  (SLP) Flowsheets (Taken 09/11/2023 1222) LTG: Patient will demonstrate ability for day to day recall: New information LTG: Patient will  demonstrate ability for day to day recall/carryover during cognitive/linguistic activities with assist (SLP): Minimal Assistance - Patient > 75%   Problem: RH Attention Goal: LTG Patient will demonstrate this level of attention during functional activites (SLP) Description: LTG:  Patient will will demonstrate this level of attention during functional activites (SLP) Flowsheets (Taken 09/11/2023 1222) Patient will demonstrate during cognitive/linguistic activities the attention type of: Sustained Patient will demonstrate this level of attention during cognitive/linguistic activities in: Controlled LTG: Patient will demonstrate this level of attention during cognitive/linguistic activities with assistance of (SLP): Minimal Assistance - Patient > 75%

## 2023-09-11 NOTE — Progress Notes (Signed)
 Occupational Therapy Session Note  Patient Details  Name: Allen Dyer MRN: [id number] Date of Birth: [birth year]  Today's Date: 09/11/2023 OT Individual Time: 1410-1436 OT Individual Time Calculation (min): 26 min  and Today's Date: 09/11/2023 OT Missed Time: 30 Minutes Missed Time Reason: Patient fatigue   Short Term Goals: Week 1:  OT Short Term Goal 1 (Week 1): Patient will transfer to shower with min assist OT Short Term Goal 2 (Week 1): Patient will transfer to toilet with min assist OT Short Term Goal 3 (Week 1): Patient will dress upper body with set up assist OT Short Term Goal 4 (Week 1): Patient will dress lower body with mod assist OT Short Term Goal 5 (Week 1): Patient will report pain in right wrist reduced to 4/10 following gentle range/ mobilization  Skilled Therapeutic Interventions/Progress Updates:    Informed by RN that pt going for MRI soon. Entered pt room and he was in the w/c with no c/o pain but reporting he feels unwell and wants to go back to bed. Significant truncal ataxia once he removed his trunk from back of chair. Stand pivot back to bed with mod A. Sit > supine with mod A to lift BLE. He reported penis pain from foley catheter once supine. OT removed pants to check. Small amount of blood on brief and foley not properly secured to leg. RN informed who entered to examine. He rolled R and L with mod A for OT to complete posterior hygiene with max A. New brief donned. Pt left supine with all needs met, bed alarm set. 30 min missed.   Therapy Documentation Precautions:  Precautions Precautions: Fall, Other (comment) (DNR) Precaution/Restrictions Comments: Urinary incontinence, DNR Required Braces or Orthoses: Splint/Cast Splint/Cast: R wrist cock up splint Restrictions Weight Bearing Restrictions Per Provider Order: No Other Position/Activity Restrictions: as tolerates  Therapy/Group: Individual Therapy  [name] 09/11/2023, 6:47 AM

## 2023-09-11 NOTE — Evaluation (Signed)
 Speech Language Pathology Assessment and Plan  Patient Details  Name: Allen Dyer MRN: [id number] Date of Birth: [birth year]  SLP Diagnosis: Dysarthria;Cognitive Impairments;Dysphagia  Rehab Potential: Good ELOS: 12-14    Today's Date: 09/11/2023 SLP Individual Time: 4098-1191 SLP Individual Time Calculation (min): 55 min   Hospital Problem: Principal Problem:   Ischemic cerebrovascular accident (CVA) (HCC)  Past Medical History:  Past Medical History:  Diagnosis Date   Chronic combined systolic and diastolic CHF (congestive heart failure) (HCC)    CKD (chronic kidney disease), stage III (HCC)    Hyperlipidemia    Hypertension    Lower extremity edema    Mild CAD    a. mild-mod by cath 05/2018.   Multiple myeloma (HCC) 11/15/2018   NICM (nonischemic cardiomyopathy) (HCC)    Peripheral neuropathy    Prostate cancer (HCC)    Status post XRT   Rheumatic fever    Stroke (HCC)    Trifascicular block    Type 2 diabetes mellitus (HCC)    Vertigo    Past Surgical History:  Past Surgical History:  Procedure Laterality Date   RIGHT HEART CATH N/A 07/10/2018   Procedure: RIGHT HEART CATH;  Surgeon: Darlis Eisenmenger, MD;  Location: [facility] INVASIVE CV LAB;  Service: Cardiovascular;  Laterality: N/A;   RIGHT/LEFT HEART CATH AND CORONARY ANGIOGRAPHY N/A 06/18/2018   Procedure: RIGHT/LEFT HEART CATH AND CORONARY ANGIOGRAPHY;  Surgeon: Millicent Ally, MD;  Location: [facility] INVASIVE CV LAB;  Service: Cardiovascular;  Laterality: N/A;    Assessment / Plan / Recommendation Clinical Impression  Allen Dyer is an 88 year old right-handed male with history significant for chronic combined systolic and diastolic heart failure, nonischemic cardiomyopathy followed by Dr. Micael Adas as well as Dr.McLean, CVA, prostate cancer status post XRT/BPH followed by Dr. Doy Gene, GERD, gout, multiple myeloma followed by Dr. Marton Sleeper, nonobstructive CAD, hyperlipidemia, hypertension, diabetes mellitus with  peripheral neuropathy and CKD stage IV. Per chart review patient lives with his daughter. Patient is able to live on the main level with bedroom and bath. Reportedly independent prior to admission. Presented 09/02/2023 with progressive unsteady gait times several weeks as well as falls x 2. MRI showed patchy early subacute ischemic infarcts involving the right greater than left cerebellar hemispheres. Associated minimal petechial blood products of the right cerebellum without hemorrhagic transformation. Pt reports injury to R wrist after fall. X-rays of the right wrist showed no fracture. 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 Irregular reconstitution of the distal V2 segment of the right vertebral artery with occlusion of the right V3 and V4 segments. Left vertebral artery occluded at the V2 segment from the level of C4-5. CT cervical spine showed hypodense lesions in the cerebellar hemispheres, measuring 3.4 x 3.2 cm on the right 0.8 cm and 1.8 x 1.1 cm on the left. No fracture or subluxation. Admission chemistries unremarkable aside glucose 171 BUN 38 creatinine 2.28, urine culture greater than 100,000 Klebsiella oxytoca, hemoglobin A1c 7.0. Echocardiogram with ejection fraction of 25 to 30% grade 1 diastolic dysfunction left ventricle demonstrating global hypokinesis. Neurology follow-up placed on aspirin  as well as Plavix  for CVA prophylaxis x 3 months then Plavix  alone. Lovenox  added for DVT prophylaxis. Reports chronic bladder urgency. Patient received 2 days of Rocephin  for UTI transitioned to Duricef. Tolerating a regular consistency diet. Therapy evaluations completed due to patient's decreased functional mobility was admitted for a comprehensive rehab program.   Cognitive-Linguistic Evaluation: Patient presents with changes to cognitive and motor speech baseline compared to PTA. Daughter  present at bedside and reports that cognition and speech have been deteriorating in 5/16 and have  actually gotten worse since coming to rehabilitation program. MDs have conducted follow up imaging with no significant findings per dtr. Patient completed initial portions of SLUMS examination, however was unable to complete final 2 questions d/t time constraints. Patient received point deductions due to deficits in orientation, working memory, delayed recall, problem solving, and constructional skills. Receptive/expressive language appears to be [facility]. Motor speech is significantly impaired and deficits include hyponasality, strained voice quality, impaired respiration for speech, and imprecise articulation which results in intelligibility ~50% at the conversation level.  Bedside Swallowing Evaluation: Patient presents with clinical swallowing function that is concerning for aspiration with overt symptoms demonstrated throughout session, specifically with thin liquids. Upon SLP entry, patient taking medications with thin liquids from RN; exhibited singular instance of immediate cough with sips of thin liquids between medications, though otherwise appeared [facility]. During PO observation, patient consumed regular/thin liquid items from breakfast tray including boiled eggs, pancake with syrup, applesauce, and water. With eggs, patient demonstrated impaired mastication and oral residue in the right buccal cavity, requiring cues to clear. Patient exhibited immediate cough with thin liquids from straw on 2 more occasions. Recommend MBS to objectively assess oropharyngeal swallowing function. SLP will defer formal diet recommendation until results from MBS have been rendered. Continue to administer medications whole with thin liquids due to overall tolerance, however administer one at a time and encourage small sips.  Patient would benefit from continued SLP services targeting all aforementioned deficits. Patient was left in chair with call bell in reach and chair alarm set. SLP will continue to target goals per plan of  care.     Skilled Therapeutic Interventions          SLP conducted skilled evaluation session to assess cognition, language, speech, and swallowing function. Utilizing SLUMS examination, patient interview, and bedside swallow evaluation. Full results above.    SLP Assessment  Patient will need skilled Speech Lanaguage Pathology Services during CIR admission    Recommendations  SLP Diet Recommendations:  (Will defer formal diet recommendations until MBS results are rendered) Medication Administration: Whole meds with liquid Supervision: Full supervision/cueing for compensatory strategies Compensations: Minimize environmental distractions;Slow rate;Small sips/bites Postural Changes and/or Swallow Maneuvers: Seated upright 90 degrees Oral Care Recommendations: Oral care BID Recommendations for Other Services: Neuropsych consult Patient destination: Home Follow up Recommendations: Home Health SLP;Outpatient SLP;24 hour supervision/assistance Equipment Recommended: None recommended by SLP    SLP Frequency 3 to 5 out of 7 days   SLP Duration  SLP Intensity  SLP Treatment/Interventions 12-14  Minumum of 1-2 x/day, 30 to 90 minutes  Cognitive remediation/compensation;Speech/Language facilitation;Functional tasks;Cueing hierarchy;Therapeutic Activities;Internal/external aids;Patient/family education;Dysphagia/aspiration precaution training    Pain Pain Assessment Pain Scale: Faces Faces Pain Scale: No hurt  Prior Functioning Cognitive/Linguistic Baseline: Within functional limits Type of Home: House  Lives With: Daughter;Family Available Help at Discharge: Family;Available 24 hours/day Vocation: Retired  Architectural technologist Overall Cognitive Status: Impaired/Different from baseline Arousal/Alertness: Awake/alert Orientation Level: Oriented to person;Oriented to place;Oriented to situation;Disoriented to time Year: 2025 Month: May Day of Week: Incorrect Attention:  Focused;Sustained;Selective Focused Attention: Appears intact Sustained Attention: Appears intact Selective Attention: Impaired Selective Attention Impairment: Functional basic;Verbal basic Memory: Impaired Memory Impairment: Retrieval deficit Awareness: Impaired Awareness Impairment: Emergent impairment Problem Solving: Impaired Problem Solving Impairment: Verbal basic;Functional basic Executive Function: Initiating;Organizing Organizing: Impaired Organizing Impairment: Functional basic;Verbal basic Initiating: Impaired Initiating Impairment: Functional basic Safety/Judgment: Appears intact  Comprehension  Auditory Comprehension Overall Auditory Comprehension: Appears within functional limits for tasks assessed Expression Expression Primary Mode of Expression: Verbal Verbal Expression Overall Verbal Expression: Appears within functional limits for tasks assessed Oral Motor Oral Motor/Sensory Function Overall Oral Motor/Sensory Function: Mild impairment Facial ROM: Reduced right Facial Symmetry: Abnormal symmetry right Facial Strength: Reduced right Lingual ROM: Within Functional Limits Lingual Symmetry: Within Functional Limits Lingual Strength: Within Functional Limits Velum: Within Functional Limits Mandible: Impaired Motor Speech Overall Motor Speech: Impaired Respiration: Impaired Level of Impairment: Phrase Phonation: Other (comment);Hoarse (strained/strangled) Resonance: Hyponasality Articulation: Impaired Level of Impairment: Word Intelligibility: Intelligibility reduced Word: 50-74% accurate Phrase: 50-74% accurate Sentence: 50-74% accurate Conversation: 50-74% accurate Motor Planning: Witnin functional limits Motor Speech Errors: Consistent Effective Techniques: Slow rate;Increased vocal intensity;Over-articulate;Pacing  Care Tool Care Tool Cognition Ability to hear (with hearing aid or hearing appliances if normally used Ability to hear (with hearing  aid or hearing appliances if normally used): 1. Minimal difficulty - difficulty in some environments (e.g. when person speaks softly or setting is noisy)   Expression of Ideas and Wants Expression of Ideas and Wants: 3. Some difficulty - exhibits some difficulty with expressing needs and ideas (e.g, some words or finishing thoughts) or speech is not clear   Understanding Verbal and Non-Verbal Content Understanding Verbal and Non-Verbal Content: 4. Understands (complex and basic) - clear comprehension without cues or repetitions  Memory/Recall Ability Memory/Recall Ability : That he or she is in a hospital/hospital unit    Intelligibility: Intelligibility reduced Word: 50-74% accurate Phrase: 50-74% accurate Sentence: 50-74% accurate Conversation: 50-74% accurate  Bedside Swallowing Assessment General Date of Onset: 09/05/23 Previous Swallow Assessment: n/a Diet Prior to this Study: Regular;Thin liquids (Level 0) Temperature Spikes Noted: No Respiratory Status: Room air History of Recent Intubation: No Behavior/Cognition: Alert;Cooperative;Requires cueing Oral Cavity - Dentition: Dentures, not available;Missing dentition;Poor condition Self-Feeding Abilities: Able to feed self;Needs set up Patient Positioning: Upright in chair/Tumbleform Baseline Vocal Quality: Hoarse (strained) Volitional Cough: Weak Volitional Swallow: Able to elicit  Oral Care Assessment   Ice Chips Ice chips: Not tested Thin Liquid Thin Liquid: Impaired Presentation: Straw;Self Fed Oral Phase Impairments: Reduced labial seal Oral Phase Functional Implications: Right anterior spillage Pharyngeal  Phase Impairments: Cough - Immediate Nectar Thick Nectar Thick Liquid: Not tested Honey Thick Honey Thick Liquid: Not tested Puree Puree: Within functional limits Presentation: Self Fed Solid Solid: Impaired Presentation: Self Fed Oral Phase Impairments: Impaired mastication Oral Phase Functional  Implications: Impaired mastication;Oral residue BSE Assessment Risk for Aspiration Impact on safety and function: Mild aspiration risk Other Related Risk Factors: History of GERD;Previous CVA;Deconditioning;Cognitive impairment  Short Term Goals: Week 1: SLP Short Term Goal 1 (Week 1): Patient will complete MBS to determine least restrictive safe diet. SLP Short Term Goal 2 (Week 1): Patient will orient to date utilizing external aids when provided with mod verbal cues. SLP Short Term Goal 3 (Week 1): Patient will selectively attend to speech therapy tasks for 10 minute intervals given mod assist. SLP Short Term Goal 4 (Week 1): Patient will recall pertinent daily information with 80% accuracy utilizing external/internal aids when given mod assist. SLP Short Term Goal 5 (Week 1): Patient will solve basic environmental problems with 80% accuracy given mod assist. SLP Short Term Goal 6 (Week 1): Patient will utilize speech intelligibility and communication repair strategies in 8/10 opportunities at the phrase level given mod assist.  Refer to Care Plan for Long Term Goals  Recommendations for other services: Neuropsych  Discharge Criteria: Patient will  be discharged from SLP if patient refuses treatment 3 consecutive times without medical reason, if treatment goals not met, if there is a change in medical status, if patient makes no progress towards goals or if patient is discharged from hospital.  The above assessment, treatment plan, treatment alternatives and goals were discussed and mutually agreed upon: by patient and by family  Allen Dyer, M.A., CCC-SLP   Allen Dyer A [place] 09/11/2023, 12:34 PM

## 2023-09-11 NOTE — Progress Notes (Signed)
 Physical Therapy Session Note  Patient Details  Name: Allen Dyer MRN: [id number] Date of Birth: 1935-11-15  Today's Date: 09/11/2023 PT Individual Time: 8295-6213 + 1300-1330 PT Individual Time Calculation (min): 43 min  + 30 min  Short Term Goals: Week 1:  PT Short Term Goal 1 (Week 1): Pt will complete bed mobility with minA PT Short Term Goal 2 (Week 1): Pt will complete bed<>chair transfers with minA PT Short Term Goal 3 (Week 1): pt will ambulate 152ft with minA and LRAD PT Short Term Goal 4 (Week 1): Pt will navigate up/down x4 stairs with modA  Skilled Therapeutic Interventions/Progress Updates:      1st session: Pt presents in bed sleeping - awakens to voice and agreeable to therapy. No reports of pain. Pt continues to present with dysarthria. SLP seeing him at 0900.   Drained catheter bag after being full - documented in flowsheets. Donned pants at bed level while threading catheter bag. Noted patient to be incontinent of BM while donning pants. Pt required totalA for posterior pericare and brief change. Pt required modA for rolling towards his R with use of bed rails, requires maxA for rolling L. Donned pants with totalA at bed level with patient assistig by bridging in bed (unable to clear hips).   Supine<>sitting from flat bed with maxA for trunk support and BLE management. Patient presents with the same "rocking" motion as he was yesterday, but less severe and not as long lasting. Completed SB transfer with maxA with his daughter present to assist with stabilizing wheelchair. Removed dirty shirt with maxA and donned a new one with maxA - dependent for donning deodorant to both underarms. Patient left sitting up in wheelchair with his daughter present. All needs met.     2nd session: Pt sleeping in wheelchair on arrival - no safety concerns. Patient awakens to voice and in agreement to therapy treatment. No reports of pain.  Transported to main gym and wheeled inside //  bars. Patient drooling out of front of his mouth - was wiping with wash cloth and then patient suddenly spit out a slice of carrot from his lunch - pt unaware he had food in his mouth. Relayed concern to SLP who reports swallow study being completed tomorrow.   Sit<>stand in // bars with modA for facilitating forward weight shifting and trunk support. Pt with significant truncal ataxia and significant "rocking" in all directions that was difficult to manage. Tried applying pressure to "ground" patient but this only minimally improved symptoms. Ataxia worsened with any attempts at lifting his feet for pre-gait training, so deferred for safety concerns. Pt aware of his ataxia.   Returned to his room and patient left sitting up with soft call bell in lap. Needs met.      Therapy Documentation Precautions:  Precautions Precautions: Fall, Other (comment) (DNR) Precaution/Restrictions Comments: Urinary incontinence, DNR Required Braces or Orthoses: Splint/Cast Splint/Cast: R wrist cock up splint Restrictions Weight Bearing Restrictions Per Provider Order: No Other Position/Activity Restrictions: as tolerates General:    Therapy/Group: Individual Therapy  Pheobe Brass 09/11/2023, 7:51 AM

## 2023-09-12 ENCOUNTER — Ambulatory Visit: Payer: Medicare Other | Admitting: Internal Medicine

## 2023-09-12 ENCOUNTER — Inpatient Hospital Stay (HOSPITAL_COMMUNITY)

## 2023-09-12 DIAGNOSIS — I69319 Unspecified symptoms and signs involving cognitive functions following cerebral infarction: Secondary | ICD-10-CM

## 2023-09-12 LAB — CBC WITH DIFFERENTIAL/PLATELET
Abs Immature Granulocytes: 0.03 10*3/uL (ref 0.00–0.07)
Basophils Absolute: 0 10*3/uL (ref 0.0–0.1)
Basophils Relative: 0 %
Eosinophils Absolute: 0.1 10*3/uL (ref 0.0–0.5)
Eosinophils Relative: 2 %
HCT: 34.1 % — ABNORMAL LOW (ref 39.0–52.0)
Hemoglobin: 11.1 g/dL — ABNORMAL LOW (ref 13.0–17.0)
Immature Granulocytes: 1 %
Lymphocytes Relative: 15 %
Lymphs Abs: 1 10*3/uL (ref 0.7–4.0)
MCH: 32.4 pg (ref 26.0–34.0)
MCHC: 32.6 g/dL (ref 30.0–36.0)
MCV: 99.4 fL (ref 80.0–100.0)
Monocytes Absolute: 0.7 10*3/uL (ref 0.1–1.0)
Monocytes Relative: 10 %
Neutro Abs: 4.7 10*3/uL (ref 1.7–7.7)
Neutrophils Relative %: 72 %
Platelets: 330 10*3/uL (ref 150–400)
RBC: 3.43 MIL/uL — ABNORMAL LOW (ref 4.22–5.81)
RDW: 12.9 % (ref 11.5–15.5)
WBC: 6.4 10*3/uL (ref 4.0–10.5)
nRBC: 0 % (ref 0.0–0.2)

## 2023-09-12 LAB — GLUCOSE, CAPILLARY
Glucose-Capillary: 99 mg/dL (ref 70–99)
Glucose-Capillary: 184 mg/dL — ABNORMAL HIGH (ref 70–99)
Glucose-Capillary: 156 mg/dL — ABNORMAL HIGH (ref 70–99)
Glucose-Capillary: 249 mg/dL — ABNORMAL HIGH (ref 70–99)

## 2023-09-12 LAB — CREATININE, SERUM
Creatinine, Ser: 2 mg/dL — ABNORMAL HIGH (ref 0.61–1.24)
GFR, Estimated: 32 mL/min — ABNORMAL LOW (ref >60)

## 2023-09-12 MED ORDER — L-METHYLFOLATE-B6-B12 3-35-2 MG PO TABS
1.0000 | ORAL_TABLET | Freq: Every day | ORAL | Status: DC
  Administered 2023-09-12 – 2023-09-27 (×16): 1 via ORAL
  Filled 2023-09-12 (×16): qty 1

## 2023-09-12 MED ORDER — MODAFINIL 100 MG PO TABS
100.0000 mg | ORAL_TABLET | Freq: Every day | ORAL | Status: DC
  Administered 2023-09-13 – 2023-09-27 (×15): 100 mg via ORAL
  Filled 2023-09-12 (×15): qty 1

## 2023-09-12 MED ORDER — GABAPENTIN 300 MG PO CAPS
300.0000 mg | ORAL_CAPSULE | Freq: Every day | ORAL | Status: DC
  Administered 2023-09-13 – 2023-09-20 (×8): 300 mg via ORAL
  Filled 2023-09-12 (×9): qty 1

## 2023-09-12 NOTE — Progress Notes (Signed)
 Occupational Therapy Session Note  Patient Details  Name: Allen Dyer MRN: [id number] Date of Birth: Jul 23, 1935  Today's Date: 09/12/2023 OT Individual Time: 4098-1191 OT Individual Time Calculation (min): 56 min    Short Term Goals: Week 1:  OT Short Term Goal 1 (Week 1): Patient will transfer to shower with min assist OT Short Term Goal 2 (Week 1): Patient will transfer to toilet with min assist OT Short Term Goal 3 (Week 1): Patient will dress upper body with set up assist OT Short Term Goal 4 (Week 1): Patient will dress lower body with mod assist OT Short Term Goal 5 (Week 1): Patient will report pain in right wrist reduced to 4/10 following gentle range/ mobilization  Skilled Therapeutic Interventions/Progress Updates:    Pt received supine with no c/o pain, agreeable to OT session. Confirmed no WB restrictions with RUE now splinted- will maintain splint. Pt reports "I'm talking more clear today". Session began with self feeding and full supervision of breakfast tray. Pt drinking water with larger straw with no s/s of aspiration. Mild oral residue, encouraged liquid wash every few bites. He was able to self feed 90% of breakfast before fatiguing and asking OT to finish feeding him applesauce. OT checked brief and there was a small amount of liquid BM. He rolled R with mod A for OT to perform peri hygiene and change brief- max A. He then came to EOB with mod A. Pt sat EOB and had significant truncal ataxia but no LOB. He completed UB dressing with mod A and bathing with min A for thoroughness. He completed 3x lateral scoot to [facility] with max A and mod cueing for technique. He returned to supine d/t MBS coming up shortly. He was left supine with all needs met, bed alarm set.   Therapy Documentation Precautions:  Precautions Precautions: Fall, Other (comment) (DNR) Precaution/Restrictions Comments: Urinary incontinence, DNR Required Braces or Orthoses: Splint/Cast Splint/Cast: R wrist cock  up splint Restrictions Weight Bearing Restrictions Per Provider Order: No Other Position/Activity Restrictions: as tolerates  Therapy/Group: Individual Therapy  [name] 09/12/2023, 7:47 AM

## 2023-09-12 NOTE — Progress Notes (Signed)
 Physical Therapy Session Note  Patient Details  Name: Allen Dyer MRN: [id number] Date of Birth: 12/06/1935  Today's Date: 09/12/2023 PT Individual Time: 3151-7616 PT Individual Time Calculation (min): 58 min   Short Term Goals: Week 1:  PT Short Term Goal 1 (Week 1): Pt will complete bed mobility with minA PT Short Term Goal 2 (Week 1): Pt will complete bed<>chair transfers with minA PT Short Term Goal 3 (Week 1): pt will ambulate 129ft with minA and LRAD PT Short Term Goal 4 (Week 1): Pt will navigate up/down x4 stairs with modA  Skilled Therapeutic Interventions/Progress Updates:      Pt presents in recliner, sleeping soundly but awakens to voice. Pt reports no pain, remains severely dysarthric and difficult to understand. Has some drooling on the R side and patient seems unaware. Needs wiping to manage.   Pt completed SB transfer with maxA from drop arm recliner to drop arm wheelchair. Over-the-back technique used to facilitate forward weight shifting and managing his truncal ataxia. MaxA for repositioning in wheelchair.   Transported patient from room to main gym and placed inside // bars. Worked on sit<>stands requiring maxA for truncal ataxia and for assisting with lifting. MaxA for standing balance because of the rocking and truncal ataxia. Rocking motion did not improve with visual or verbal cueing. He was able to ambulate a few steps while having + 2 assist for wheelchair follow. Patient with significant ataxia on his LLE > RLE and has significant forward flexion in the trunk and hips.   Patient taken to hallway and placed in wheelchair to face the wall with hand rail in front of him. Worked on static standing in this position with mod/maxA for balance and using the wall as a visual cue to keep him from rocking forwards/backwards and to help promote more of an upright posture. Unfortunately, became incontinent of BM while standing so needed returned to his room. Completed a stand  pivot transfer with max/totalA from w/c to bed and needing totalA for sit>supine for trunk and BLE management.   Pt left in bed and notified nursing of need for change/clean due to BM incontinence.    Therapy Documentation Precautions:  Precautions Precautions: Fall, Other (comment) (DNR) Precaution/Restrictions Comments: Urinary incontinence, DNR Required Braces or Orthoses: Splint/Cast Splint/Cast: R wrist cock up splint Restrictions Weight Bearing Restrictions Per Provider Order: No Other Position/Activity Restrictions: as tolerates General:     Therapy/Group: Individual Therapy  Pheobe Brass 09/12/2023, 7:52 AM

## 2023-09-12 NOTE — Progress Notes (Signed)
No bm this shift.

## 2023-09-12 NOTE — Progress Notes (Signed)
 PROGRESS NOTE   Subjective/Complaints: MBS today Discussed that foley is painful for him but that he does not want it removed, discussed with nursing  ROS: +right wrist pain, +dizziness--ongoing Denies constipation, HA, SOB, CP, +RUE edema, +pain from foley  Objective:   MR BRAIN WO CONTRAST Result Date: 09/12/2023 CLINICAL DATA:  Initial evaluation for acute altered mental status. EXAM: MRI HEAD WITHOUT CONTRAST TECHNIQUE: Multiplanar, multiecho pulse sequences of the brain and surrounding structures were obtained without intravenous contrast. COMPARISON:  CT from earlier the same day as well as MRI from 09/08/2023 FINDINGS: Brain: Age-related cerebral atrophy with mild chronic microvascular ischemic disease. Multiple scattered remote cerebellar infarcts. Remote lacunar infarct at the posterior right lentiform nucleus. Evolving subacute infarct within the right cerebellum with mild residual diffusion signal abnormality, slightly less prominent from prior. Additional well-circumscribed foci of restricted diffusion involving the middle cerebellar peduncles again seen, similar to prior, and could reflect ischemic infarcts and/or evolving changes of wallerian degeneration as previously noted. No associated hemorrhage or significant regional mass effect. No other evidence for acute or interval infarction. Gray-white matter differentiation otherwise maintained. No acute or chronic intracranial blood products. No mass lesion, midline shift or mass effect. No hydrocephalus or extra-axial fluid collection. Pituitary gland and suprasellar region within normal limits. Vascular: Irregular flow voids throughout the vertebrobasilar system, similar to prior, and better evaluated on prior CTA. Anterior arterial intracranial flow voids are well maintained. Skull and upper cervical spine: Craniocervical junction within normal limits. Bone marrow signal intensity  within normal limits. No scalp soft tissue abnormality. Sinuses/Orbits: Prior bilateral ocular lens replacement. Paranasal sinuses remain largely clear. Trace right mastoid effusion, of doubtful significance. Other: None. IMPRESSION: 1. Persistent foci of restricted diffusion involving the middle cerebellar peduncles, similar to prior, and could reflect ischemic infarcts and/or evolving changes of Wallerian degeneration as previously noted. 2. Evolving subacute infarct within the right cerebellum, slightly less prominent as compared to prior. No associated hemorrhage or significant regional mass effect. 3. No other new acute intracranial abnormality. 4. Underlying age-related cerebral atrophy with mild chronic microvascular ischemic disease, with multiple remote cerebellar and right basal ganglia infarcts. Electronically Signed   By: Virgia Griffins M.D.   On: 09/12/2023 02:11   CT HEAD WO CONTRAST ( ) Result Date: 09/11/2023 CLINICAL DATA:  Initial evaluation for acute altered mental status. EXAM: CT HEAD WITHOUT CONTRAST TECHNIQUE: Contiguous axial images were obtained from the base of the skull through the vertex without intravenous contrast. RADIATION DOSE REDUCTION: This exam was performed according to the departmental dose-optimization program which includes automated exposure control, adjustment of the mA and/or kV according to patient size and/or use of iterative reconstruction technique. COMPARISON:  CT from 09/10/2023 and MRI from 09/08/2023 FINDINGS: Brain: Age-related cerebral atrophy with chronic microvascular ischemic disease. Multiple remote cerebellar infarcts again noted. Well-circumscribed hypodensities involving the cerebellum/middle cerebellar peduncles, consistent with edema, likely due to evolving wallerian degeneration and/or ischemia. Overall appearance is not significantly changed from previous exams. No other acute large vessel territory infarct. No acute intracranial hemorrhage.  No mass lesion or midline shift. No hydrocephalus or extra-axial fluid collection. Vascular: No abnormal hyperdense vessel. Calcified atherosclerosis present at the skull  base. Skull: Scalp soft tissues within normal limits.  Calvarium intact. Sinuses/Orbits: Globes and orbital soft tissues demonstrate no acute finding. Paranasal sinuses are largely clear. No mastoid effusion. Other: None. IMPRESSION: 1. Stable head CT with no significant interval change in appearance of the brain as compared to previous exams. 2. Evolving hypodensities involving the cerebellum/middle cerebellar peduncles, consistent with evolving wallerian degeneration and/or ischemia, stable from prior. 3. No other new acute intracranial abnormality. 4. Underlying atrophy with chronic microvascular ischemic disease. Electronically Signed   By: Virgia Griffins M.D.   On: 09/11/2023 22:50   CT HAND RIGHT WO CONTRAST Result Date: 09/11/2023 CLINICAL DATA:  Pain and swelling EXAM: CT OF THE RIGHT HAND WITHOUT CONTRAST TECHNIQUE: Multidetector CT imaging of the right hand was performed according to the standard protocol. Multiplanar CT image reconstructions were also generated. RADIATION DOSE REDUCTION: This exam was performed according to the departmental dose-optimization program which includes automated exposure control, adjustment of the mA and/or kV according to patient size and/or use of iterative reconstruction technique. 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 Small ossicle posterior to the triquetrum, cortical discontinuity along presumed donor site, compatible with triquetral fracture, cannot exclude acute chronicity, correlate with dorsal point tenderness over the triquetrum. Scattered geodes along the carpus and along the distal ulnar  articular surface and radial styloid articular surface. Mild degenerative spurring along the carpus. Ligaments Suboptimally assessed by CT. Muscles and Tendons Unremarkable Soft tissues Abnormal thickened appearance of the flexor retinaculum for example on image 105 SERIES 7. Subcutaneous edema along the dorsal-ulnar margin of the wrist. IMPRESSION: 1. Small ossicle posterior to the triquetrum, cortical discontinuity along presumed donor site, compatible with triquetral fracture, cannot exclude acute chronicity, correlate with dorsal point tenderness over the triquetrum. 2. Abnormal thickened appearance of the flexor retinaculum, query remote flexor retinacular injury or prior release with resulting fibrosis. 3. 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 4. Scattered geodes along the carpus and along the distal ulnar articular surface and radial styloid articular surface. 5. Subcutaneous edema along the dorsal-ulnar margin of the wrist. Electronically Signed   By: Freida Jes M.D.   On: 09/11/2023 10:48   CT FOREARM RIGHT WO CONTRAST Result Date: 09/11/2023 CLINICAL DATA:  Pain and swelling in the forearm EXAM: CT OF THE RIGHT FOREARM WITHOUT CONTRAST TECHNIQUE: Multidetector CT imaging was performed according to the standard protocol. Multiplanar CT image reconstructions were also generated. RADIATION DOSE REDUCTION: This exam was performed according to the departmental dose-optimization program which includes automated exposure control, adjustment of the mA and/or kV according to patient size and/or use of iterative reconstruction technique. COMPARISON:  Elbow radiographs 09/04/2023 and wrist radiographs 09/02/2023 FINDINGS: Bones/Joint/Cartilage Elbow joint effusion noted. Spurring of the radial head, coronoid process, and olecranon. Scattered small geodes of the carpus distal ulnar  articular surface as well as the radial styloid. Chondrocalcinosis of the TFCC disc and volar and dorsal to the carpus. Age-indeterminate dorsal triquetral fracture. Ligaments Suboptimally assessed by CT. Muscles and Tendons Unremarkable Soft tissues Mild subcutaneous edema along the ulnar side of the forearm. IMPRESSION: 1. Age-indeterminate dorsal triquetral fracture. 2. Elbow joint effusion. 3. Chondrocalcinosis of the TFCC disc and volar and dorsal to the carpus. 4. Scattered small geodes of the carpus distal ulnar articular surface  as well as the radial styloid. 5. Mild subcutaneous edema along the ulnar side of the forearm. Electronically Signed   By: Freida Jes M.D.   On: 09/11/2023 10:41   VAS US  UPPER EXTREMITY VENOUS DUPLEX Result Date: 09/10/2023 UPPER VENOUS STUDY  Patient Name:  ACESON LABELL  Date of Exam:   09/10/2023 Medical Rec #: [id number]      Accession #:    [id number] Date of Birth: [birth year]      Patient Gender: M Patient Age:   9 years Exam Location:  [facility] Procedure:      VAS US  UPPER EXTREMITY VENOUS DUPLEX Referring Phys: Estill Hemming [facility] --------------------------------------------------------------------------------  Indications: Pain, and Swelling Limitations: Poor ultrasound/tissue interface and musculoskeletal features. Comparison Study: No prior study on file Performing Technologist: Carleene Chase RVS  Examination Guidelines: A complete evaluation includes B-mode imaging, spectral Doppler, color Doppler, and power Doppler as needed of all accessible portions of each vessel. Bilateral testing is considered an integral part of a complete examination. Limited examinations for reoccurring indications may be performed as noted.  Right Findings: +----------+------------+---------+-----------+----------+-------+ RIGHT     CompressiblePhasicitySpontaneousPropertiesSummary +----------+------------+---------+-----------+----------+-------+ IJV                       Yes       Yes                      +----------+------------+---------+-----------+----------+-------+ Subclavian               Yes       Yes                      +----------+------------+---------+-----------+----------+-------+ Axillary                 Yes       Yes                      +----------+------------+---------+-----------+----------+-------+ Brachial      Full                                          +----------+------------+---------+-----------+----------+-------+ Radial        Full       Yes       Yes                      +----------+------------+---------+-----------+----------+-------+ Ulnar         Full                                          +----------+------------+---------+-----------+----------+-------+ Cephalic      Full                                          +----------+------------+---------+-----------+----------+-------+ Basilic                  Yes       Yes                      +----------+------------+---------+-----------+----------+-------+  Left Findings: +----------+------------+---------+-----------+----------+-------+ LEFT      CompressiblePhasicitySpontaneousPropertiesSummary +----------+------------+---------+-----------+----------+-------+ Subclavian  Yes       Yes                      +----------+------------+---------+-----------+----------+-------+  Summary:  Right: No evidence of deep vein thrombosis in the upper extremity. No evidence of superficial vein thrombosis in the upper extremity. Tissue disturbance and subcutaneous edema noted throughout the right upper extremity.  Left: No evidence of thrombosis in the subclavian.  *See table(s) above for measurements and observations.  Diagnosing physician: Runell Countryman Electronically signed by Runell Countryman on 09/10/2023 at 3:44:44 PM.    Final    CT HEAD WO CONTRAST ( ) Result Date: 09/10/2023 CLINICAL DATA:  88 year old male with  neurologic deficit. Bilateral cerebellar infarcts. Progressive signal abnormality in the cerebellar peduncles on MRI 2 days ago. Subsequent encounter. EXAM: CT HEAD WITHOUT CONTRAST TECHNIQUE: Contiguous axial images were obtained from the base of the skull through the vertex without intravenous contrast. RADIATION DOSE REDUCTION: This exam was performed according to the departmental dose-optimization program which includes automated exposure control, adjustment of the mA and/or kV according to patient size and/or use of iterative reconstruction technique. COMPARISON:  Brain MRI 09/08/2023 and earlier. FINDINGS: Brain: Patchy, confluent hypodensity in the bilateral cerebellar hemispheres and cerebellar peduncles corresponding to recent MRI appearance. No hemorrhagic transformation. No posterior fossa mass effect. Basilar cisterns remain normal. No ventriculomegaly. Elsewhere the brainstem gray-white differentiation appears stable. Supratentorial gray-white differentiation is stable. Incidental dural calcification along the falx. No new cerebral edema identified. Vascular: Calcified atherosclerosis at the skull base. No suspicious intracranial vascular hyperdensity. Skull: Stable and intact. Sinuses/Orbits: Visualized paranasal sinuses and mastoids are stable and well aerated. Other: No acute orbit or scalp soft tissue finding. IMPRESSION: 1. Stable from recent MRI. Cerebellar and cerebellar peduncle infarct related edema with no hemorrhagic transformation or mass effect. 2. No new intracranial abnormality. Electronically Signed   By: Marlise Simpers M.D.   On: 09/10/2023 12:33    Recent Labs    09/11/23 1407 09/12/23 0459  WBC 7.0 6.4  HGB 12.3* 11.1*  HCT 37.7* 34.1*  PLT 320 330   Recent Labs    09/09/23 1049 09/11/23 1407 09/12/23 0459  NA 136 137  --   K 3.7 4.4  --   CL 99 100  --   CO2 26 23  --   GLUCOSE 135* 272*  --   BUN 48* 45*  --   CREATININE 2.32* 2.17* 2.00*  CALCIUM  9.0 9.7  --      Intake/Output Summary (Last 24 hours) at 09/12/2023 6578 Last data filed at 09/12/2023 0800 Gross per 24 hour  Intake 712 ml  Output 1425 ml  Net -713 ml        Physical Exam: Vital Signs Blood pressure 138/80, pulse 70, temperature 98.2 F (36.8 C), resp. rate 16, height 5\' 7"  (1.702 m), weight 92.6 kg, SpO2 95%.  Gen: no distress, normal appearing, sitting in chair, appears comfortable  HEENT: oral mucosa pink and moist, NCAT.  Severe nystagmus and bilateral lateral gaze. Cardio: Reg rate Chest: CTAB, normal effort, normal rate of breathing Abd: soft, non-distended, NT Ext: R arm swelling- continues GU: clear yellow urine in bag Psych: pleasant, normal affect Skin: intact; mild right upper extremity swelling/edema  Neuro: dysarthia present--approximately 50% intelligible., improved comprehension today and ability verbalize wants   No tone or spasticity noted.  Cranial nerves grossly intact.   MSK: R arm in splint     Assessment/Plan: 1. Functional deficits which require  3+ hours per day of interdisciplinary therapy in a comprehensive inpatient rehab setting. Physiatrist is providing close team supervision and 24 hour management of active medical problems listed below. Physiatrist and rehab team continue to assess barriers to discharge/monitor patient progress toward functional and medical goals  Care Tool:  Bathing    Body parts bathed by patient: Right arm, Chest, Abdomen, Front perineal area, Right upper leg, Left upper leg, Face   Body parts bathed by helper: Left arm, Right lower leg, Left lower leg, Buttocks     Bathing assist Assist Level: Moderate Assistance - Patient 50 - 74%     Upper Body Dressing/Undressing Upper body dressing   What is the patient wearing?: Pull over shirt    Upper body assist Assist Level: Minimal Assistance - Patient > 75%    Lower Body Dressing/Undressing Lower body dressing      What is the patient wearing?:  Incontinence brief, Pants     Lower body assist Assist for lower body dressing: Maximal Assistance - Patient 25 - 49%     Toileting Toileting Toileting Activity did not occur (Clothing management and hygiene only): N/A (no void or bm)  Toileting assist Assist for toileting: Total Assistance - Patient < 25%     Transfers Chair/bed transfer  Transfers assist     Chair/bed transfer assist level: Moderate Assistance - Patient 50 - 74%     Locomotion Ambulation   Ambulation assist      Assist level: Moderate Assistance - Patient 50 - 74% Assistive device: Hand held assist Max distance: 29ft   Walk 10 feet activity   Assist     Assist level: Moderate Assistance - Patient - 50 - 74% Assistive device: Hand held assist   Walk 50 feet activity   Assist    Assist level: Minimal Assistance - Patient > 75% Assistive device: Walker-Eva    Walk 150 feet activity   Assist    Assist level: Minimal Assistance - Patient > 75% Assistive device: Walker-Eva    Walk 10 feet on uneven surface  activity   Assist Walk 10 feet on uneven surfaces activity did not occur: Safety/medical concerns         Wheelchair     Assist Is the patient using a wheelchair?: Yes Type of Wheelchair: Manual    Wheelchair assist level: Dependent - Patient 0%      Wheelchair 50 feet with 2 turns activity    Assist        Assist Level: Dependent - Patient 0%   Wheelchair 150 feet activity     Assist      Assist Level: Dependent - Patient 0%   Blood pressure 138/80, pulse 70, temperature 98.2 F (36.8 C), resp. rate 16, height 5\' 7"  (1.702 m), weight 92.6 kg, SpO2 95%.    Medical Problem List and Plan: 1. Functional deficits secondary to bilateral cerebellar infarct, right more than left secondary large vessel disease from severe posterior circulation stenosis             -patient may shower             -ELOS/Goals: 10-12,PT/OT sup to mod I              -Grounds pass ordered  -MRI repeat 5/17, was discussed with neurology Dr. Bonnita Buttner , not acute cva but some progression of prior strokes. No additional changes, avoid hypotension   -Consult SLP- worsened dysarthria over past few days  - 5-19: Multiple messages  from therapist and nursing concerns regarding worsening dysarthria, ataxia, and right facial sensory loss.  No new focal deficits on exam, however symptoms did not improve with medication adjustments.  Stat CT head without acute findings.  No hemorrhagic transformation or mass effect.  2.  Antithrombotics: -DVT/anticoagulation:  Pharmaceutical: Lovenox              -antiplatelet therapy: Aspirin  81 mg daily and Plavix  75 mg day x 3 months then Plavix  alone 3. Pain Management: Neurontin  300 mg twice daily--well-controlled 4. Mood/Behavior/Sleep: Provide emotional support             -antipsychotic agents: N/A 5. Neuropsych/cognition: This patient is capable of making decisions on his own behalf.  -5/18 start lexapro  for mood, consider neuropsych consult  5/19: DC lexapro  d/t worsening lethargy, dysarthria  6. Skin/Wound Care: Routine skin checks  5-19: Right upper extremity duplex ordered prior for swelling negative for DVT, however with multiple cystic structures concerning for abscess.  CT recommended for further assessment, order placed.  No current fevers or leukocytosis to indicate infection.  7. Fluids/Electrolytes/Nutrition: Routine in and outs with follow-up chemistries 8.  Klebsiella oxytoca UTI.  Completing course of Duricef  - Per urology note, is to continue on cefadroxil  1 tab p.o. nightly for CAUTI prophylaxis while Foley is in place  9.  CKD stage IV.  Discussed that creatinine is improving  -Cr overalls stable 2.47/BUN 50- monitor  5-19: BUN/creatinine stable. 10.  Chronic combined systolic and diastolic congestive heart failure. EF 25-30% Monitor for any signs of fluid overload.  Follow-up cardiology services.  Continue  Demadex  60 mg twice daily, weight reviewed and is increased, daily weights ordered, weights are stable/increased, continue current regimen  5/17 wt trending up although suspect may be inaccurate, no signs of overload noted  5/18 Wt appears stable, monitor  5/19: weights downtrending, ? Stroke recrudescence with normotension d/t stenosis, reduce torsemide  to 60 mg daily  5/21: Cr reviewed and is improving  Filed Weights   09/10/23 0500 09/11/23 0500 09/12/23 0520  Weight: 93.5 kg 93.7 kg 92.6 kg     11.  Nonischemic cardiomyopathy.  Followed by cardiology service Dr. Micael Adas.  Continue Imdur  30 mg daily and Coreg  6.25 mg twice daily.  See #10.  12.  History of prostate cancer, BPH.  Followed by Dr. Freddi Jaeger.  Flomax  0.4 mg twice daily             -Check PVR/bladder scan  5/17-8 he has foley in placed, clear yellow urine  Discussed trial of void but patient prefers foley at this time  13.  Diabetes mellitus with peripheral neuropathy and hemoglobin A1c 7.0.  Glucotrol  5 mg daily  -5/18 few elevated CBGs, continue to monitor trend for now  5-19: BG is generally well-controlled, monitor  CBG (last 3)  Recent Labs    09/11/23 1650 09/11/23 2129 09/12/23 0559  GLUCAP 204* 126* 99     14. HTN. Long term goal normotensive. Home meds carvedilol , imdur , toresemide  -5/18 would like avoid hypotension, decrease Imdur  to 15 mg, decrease Coreg  to 3.125 twice daily, decrease torsemide  to daily  5/19: Weight down, BP normotensive but may be underperfusing; reduce torsemide  as above. Can consider reducing flomax  as well pending DC foley trial but cannot stay off long term d/t chronic retention.  Vitals:   09/12/23 0516 09/12/23 0800  BP: (!) 145/80 138/80  Pulse: 67 70  Resp: 16   Temp: 98.2 F (36.8 C)   SpO2:  95%  15. Obesity. Body mass index is 33.67 kg/m.             -dietary education  16. R wrist pain due to fall. Wearing wrist brace. Xray without fracture. Voltaren  gel  scheduled             -tylenol  prn  -5/17 Will check vas US  due to R arm swelling  5-19: See above; no DVT, possible abscesses/cystic structures.    5/20: CT reviewed and shows age indeterminate fracture, will consult ortho  17. Constipation. Miralax  daily, d/c [place], encouraged prune juice, asked nursing to bring prune juice for him  Last bowel movement 5-19  18. Dizziness/vertigo/nystagmus: vestibular eval ordered   Decrease meclizine  to daily given lethargy.  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  If no improvement, would consider addition of scopolamine patch  19. Anemia: Hgb reviewed and decreased to 11.1, repeat tomorrow to trend  20. AMS: MRI brain and CT Head obtained without acute concerning findings, have consulted neurology to review, does appear better today    LOS: 7 days A FACE TO FACE EVALUATION WAS PERFORMED  Brier Firebaugh P Mariame Rybolt 09/12/2023, 9:09 AM

## 2023-09-12 NOTE — Progress Notes (Signed)
 Modified Barium Swallow Study  Patient Details  Name: Tidus Upchurch MRN: [id number] Date of Birth: 1935-07-19  Today's Date: 09/12/2023  HPI/PMH: HPI: Darvis Croft is an 88 year old right-handed male with history significant for chronic combined systolic and diastolic heart failure, nonischemic cardiomyopathy, CVA, prostate cancer status post XRT/BPH, GERD, gout, multiple myeloma, nonobstructive CAD, hyperlipidemia, hypertension, diabetes mellitus with peripheral neuropathy and CKD stage IV. Presented 09/02/2023 with progressive unsteady gait times several weeks as well as falls x 2.  MRI showed patchy early subacute ischemic infarcts involving the right greater than left cerebellar hemispheres.  Associated minimal petechial blood products of the right cerebellum without hemorrhagic transformation. 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  Irregular reconstitution of the distal V2 segment of the right vertebral artery with occlusion of the right V3 and V4 segments.  Left vertebral artery occluded at the V2 segment from the level of C4-5.  CT cervical spine showed hypodense lesions in the cerebellar hemispheres, measuring 3.4 x 3.2 cm on the right 0.8 cm and 1.8 x 1.1 cm on the left. Was tolerating a regular consistency diet until 09/07/23 when patient developed significantly worsening motor speech and began coughing with thin liquids.   Clinical Impression: Clinical Impression: Patient presents with mild oropharyngeal dysphagia primarily characterized by reductions to pharyngeal efficiency and safety d/t mistiming of the swallow with thin liquids. Orally, patient demonstrates weakness/numbness in the right cheek and lips resulting in anterior spillage with all liquids administered, escaping to mid chin. Pharyngeally, patient demosntrated reduction in base of tongue retraction to the posterior pharyngeal wall and reduced pharyngeal stripping wave resulting in residue in the vallecula and pyriform  sinuses which mostly cleared with subsequent swallows. Once instance of aspiration was observed with consecutive cup sips of thin liquids, however suspect this is happening intermittently throughout the day given cough response after consecutive sips at bedside throughout evaluation and reports from daughter/nursing. Aspiration was secondary to mistiming and inefficiency as bolus material fell to the pyriform sinuses before the swallow, and material from the initial swallow was not fully cleared prior to next swallow resulting in overflow and resultant spillage into the trachea. Cough response was activated but was not effective. Notably at bedside, patient has been noted to exhibit intermittent instances of oral holding and residue though not observed on today's study, thus recommend Dys3/thin liquids utilizing SINGLE SIPS strategy with liquid. NO STRAWS as patient demonstrated reduced cognitive ability to implement single sip strategies when straw was utilized. Administer medications whole in puree for ease. SLP will continue to follow.  Recommendations/Plan: Swallowing Evaluation Recommendations Swallowing Evaluation Recommendations Recommendations: PO diet PO Diet Recommendation: Dysphagia 3 (Mechanical soft); Thin liquids (Level 0) Liquid Administration via: Cup; No straw Medication Administration: Whole meds with puree Supervision: Full supervision/cueing for swallowing strategies Swallowing strategies  : Slow rate; Small bites/sips; Check for pocketing or oral holding Postural changes: Position pt fully upright for meals Oral care recommendations: Oral care BID (2x/day)  Rhesa Celeste Josefita Weissmann 09/12/2023, 9:50 AM

## 2023-09-12 NOTE — Progress Notes (Signed)
 PT called at 0205, nurse entered room. CPAP had moved up on pt face, nurse repositioned and asked if that felt better. Pt stated , " I think so." Respirations even and unlabored on cpap. O2 sat 96%

## 2023-09-12 NOTE — Plan of Care (Signed)
  Problem: Consults Goal: RH STROKE PATIENT EDUCATION Description: See Patient Education module for education specifics  Outcome: Progressing   Problem: RH BOWEL ELIMINATION Goal: RH STG MANAGE BOWEL WITH ASSISTANCE Description: STG Manage Bowel with supervision Assistance. Outcome: Progressing   Problem: RH BOWEL ELIMINATION Goal: RH STG MANAGE BOWEL W/MEDICATION W/ASSISTANCE Description: STG Manage Bowel with Medication with Assistance. Outcome: Progressing   Problem: RH BLADDER ELIMINATION Goal: RH STG MANAGE BLADDER WITH ASSISTANCE Description: STG Manage Bladder With supervision Assistance Outcome: Progressing   Problem: RH SKIN INTEGRITY Goal: RH STG SKIN FREE OF INFECTION/BREAKDOWN Description: Manage skin free of infection/breakdown with supervision assistance Outcome: Progressing   Problem: RH KNOWLEDGE DEFICIT Goal: RH STG INCREASE KNOWLEDGE OF DIABETES Description: Manage knowledge of diabetes with supervision assistance from daughter using educational materials provided Outcome: Progressing   Problem: RH PAIN MANAGEMENT Goal: RH STG PAIN MANAGED AT OR BELOW PT'S PAIN GOAL Description: <4 w/ prns Outcome: Progressing

## 2023-09-12 NOTE — Consult Note (Signed)
 Neuropsychological Consultation Comprehensive Inpatient Rehab   Patient:   Allen Dyer   DOB:   [birth year]  MR Number:  [id number]  Location:  [facility] [address]ENTER [address] [place] [address] Dept: [phone] Loc: [phone]           Date of Service:   09/12/2023  Start Time:   1 PM End Time:   2 PM  Provider/Observer:  Chapman Commodore, Psy.D.       Clinical Neuropsychologist       Billing Code/Service: [phone]  Reason for Service:    Patient Name: Allen Dyer Age: 49 Handedness: Right-handed Reason for Referral: Neuropsychological consultation during ongoing admission to the Comprehensive Inpatient Rehabilitation (CIR) unit due to persistent cognitive and motor changes following a cerebellar infarct.  Medical History: The patient has a complex medical history, including:  Congestive heart failure Cardiomyopathy Prostate cancer Gout Multiple myeloma Coronary artery disease (CAD) Hyperlipidemia Hypertension Diabetes mellitus Chronic kidney disease Clinical Presentation: Mr. Weese presented on 09/02/2023 with a progressively unsteady gait and multiple falls. MRI revealed a subacute ischemic infarct involving the right greater than left cerebellar hemispheres, with minimal blood product in the right cerebellum and no evidence of hemorrhagic transformation. Chronic infarcts in the cerebellum and basal ganglia were also noted, indicating prior cerebrovascular events. Due to functional decline, the patient was evaluated by therapy and admitted to the CIR unit.  Clinical Visit Summary (Today): During today's attempted evaluation, the patient was found asleep in a recliner chair in an upright position. He was deeply asleep and difficult to arouse. Despite efforts, he did not lift his head and remained lethargic throughout the encounter. His speech was markedly dysarthric, making verbal communication  challenging.  Providers noted that the patient is often more alert and interactive, particularly in the mornings. Therapy notes corroborate intermittent difficulty with arousal and engagement in therapy sessions. Due to his current state of reduced arousal, a reliable assessment of his cognitive functioning could not be completed today.  It is worth noting that the patient's speech has been more articulate in the recent past, including periods following his prior basal ganglia infarct.  HPI for the current admission:    HPI: Allen Dyer is an 88 year old right-handed male with history significant for chronic combined systolic and diastolic heart failure, nonischemic cardiomyopathy followed by Dr. Micael Adas as well as Dr.McLean, CVA, prostate cancer status post XRT/BPH followed by Dr. Doy Gene, GERD, gout, multiple myeloma followed by Dr. Marton Sleeper, nonobstructive CAD, hyperlipidemia, hypertension, diabetes mellitus with peripheral neuropathy and CKD stage IV. Per chart review patient lives with his daughter. Patient is able to live on the main level with bedroom and bath. Reportedly independent prior to admission. Presented 09/02/2023 with progressive unsteady gait times several weeks as well as falls x 2. MRI showed patchy early subacute ischemic infarcts involving the right greater than left cerebellar hemispheres. Associated minimal petechial blood products of the right cerebellum without hemorrhagic transformation. Pt reports injury to R wrist after fall. X-rays of the right wrist showed no fracture. 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 Irregular reconstitution of the distal V2 segment of the right vertebral artery with occlusion of the right V3 and V4 segments. Left vertebral artery occluded at the V2 segment from the level of C4-5. CT cervical spine showed hypodense lesions in the cerebellar hemispheres, measuring 3.4 x 3.2 cm on the right 0.8 cm and 1.8 x 1.1 cm on the left. No fracture  or subluxation. Admission chemistries unremarkable aside glucose 171 BUN 38 creatinine 2.28, urine culture greater than 100,000 Klebsiella oxytoca, hemoglobin A1c 7.0. Echocardiogram with ejection fraction of 25 to 30% grade 1 diastolic dysfunction left ventricle demonstrating global hypokinesis. Neurology follow-up placed on aspirin  as well as Plavix  for CVA prophylaxis x 3 months then Plavix  alone. Lovenox  added for DVT prophylaxis. Reports chronic bladder urgency. Patient received 2 days of Rocephin  for UTI transitioned to Duricef. Tolerating a regular consistency diet. Therapy evaluations completed due to patient's decreased functional mobility was admitted for a comprehensive rehab program.   Medical History:   Past Medical History:  Diagnosis Date   Chronic combined systolic and diastolic CHF (congestive heart failure) (HCC)    CKD (chronic kidney disease), stage III (HCC)    Hyperlipidemia    Hypertension    Lower extremity edema    Mild CAD    a. mild-mod by cath 05/2018.   Multiple myeloma (HCC) 11/15/2018   NICM (nonischemic cardiomyopathy) (HCC)    Peripheral neuropathy    Prostate cancer (HCC)    Status post XRT   Rheumatic fever    Stroke [facility]    Trifascicular block    Type 2 diabetes mellitus (HCC)    Vertigo          Patient Active Problem List   Diagnosis Date Noted   Unspecified symptoms and signs involving cognitive functions following cerebral infarction 09/12/2023   Ischemic cerebrovascular accident (CVA) (HCC) 09/05/2023   CVA (cerebrovascular accident) (HCC) 09/02/2023   Cerebellar stroke (HCC) 09/02/2023   Urinary tract infection without hematuria 09/02/2023   Fall at home, initial encounter 09/02/2023   Dizziness 09/02/2023   Vertigo of central origin 09/02/2023   Porokeratosis 08/29/2022   Acute CVA (cerebrovascular accident) (HCC) 09/11/2021   DNR (do not resuscitate) 09/11/2021   Peripheral vascular disease (HCC) 03/30/2021   Type 2 diabetes mellitus  with stage 4 chronic kidney disease, without long-term current use of insulin  (HCC) 06/21/2020   Type 2 diabetes mellitus with hyperglycemia, without long-term current use of insulin  (HCC) 06/21/2020   Deficiency anemia 12/09/2019   Mild chronic anemia 03/06/2019   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   Pain due to onychomycosis of toenails of both feet 10/18/2018   Type 2 diabetes mellitus with vascular disease (HCC) 10/18/2018   Acute on chronic combined systolic and diastolic CHF (congestive heart failure) (HCC)    Acute on chronic congestive heart failure (HCC) 07/04/2018   CHF exacerbation (HCC) 07/03/2018   AKI (acute kidney injury) (HCC) 07/03/2018   Acute CHF (congestive heart failure) (HCC) 06/18/2018   NICM (nonischemic cardiomyopathy) (HCC)    Pulmonary hypertension (HCC)    Essential hypertension 06/14/2018   Type 2 diabetes mellitus (HCC)    History of prostate cancer    Rheumatic fever    Diabetic peripheral neuropathy associated with type 2 diabetes mellitus (HCC)    Lower extremity edema    Chronic kidney disease (CKD), stage IV (severe) (HCC)    Hyperlipidemia    Chronic combined systolic (congestive) and diastolic (congestive) heart failure (HCC)     Behavioral Observation/Mental Status:   Trinton Prewitt  presents as a 88 y.o.-year-old Right handed African American Male who appeared his stated age. his dress was Appropriate and he was Well Groomed and his manners were Appropriate to the situation.  his participation was indicative of Drowsy and Inattentive behaviors.  There were physical disabilities noted.  he displayed an appropriate level of cooperation and motivation.  Interactions:    Minimal Drowsy and Inattentive  Attention:   abnormal and attention span appeared shorter than expected for age  Memory:   abnormal; remote memory intact, recent memory impaired  Visuo-spatial:   not examined  Speech (Volume):  low  Speech:   garbled; slurred  Thought  Process:  Disorganized  Unfocused  Though Content:  WNL; not suicidal and not homicidal  Orientation:   person and place  Judgment:   Poor  Planning:   Poor  Affect:    Blunted and Lethargic  Mood:    Flat  Insight:   Shallow  Family Med/Psych History:  Family History  Problem Relation Age of Onset   Cancer Mother    Congestive Heart Failure Father        died from heart failure at the age of 55   Cancer Sister    Congestive Heart Failure Brother        died from heart failure at the age of 48   Stroke Neg Hx    Impression/DX:   During today's attempted evaluation, the patient was found asleep in a recliner chair in an upright position. He was deeply asleep and difficult to arouse. Despite efforts, he did not lift his head and remained lethargic throughout the encounter. His speech was markedly dysarthric, making verbal communication challenging.  Providers noted that the patient is often more alert and interactive, particularly in the mornings. Therapy notes corroborate intermittent difficulty with arousal and engagement in therapy sessions. Due to his current state of reduced arousal, a reliable assessment of his cognitive functioning could not be completed today.  It is worth noting that the patient's speech has been more articulate in the recent past, including periods following his prior basal ganglia infarct.        Electronically Signed   _______________________ Chapman Commodore, Psy.D. Clinical Neuropsychologist

## 2023-09-12 NOTE — Progress Notes (Signed)
 Occupational Therapy Session Note  Patient Details  Name: Allen Dyer MRN: [id number] Date of Birth: [birth year]  Today's Date: 09/12/2023 OT Individual Time: 1000-1100 OT Individual Time Calculation (min): 60 min    Short Term Goals: Week 1:  OT Short Term Goal 1 (Week 1): Patient will transfer to shower with min assist OT Short Term Goal 2 (Week 1): Patient will transfer to toilet with min assist OT Short Term Goal 3 (Week 1): Patient will dress upper body with set up assist OT Short Term Goal 4 (Week 1): Patient will dress lower body with mod assist OT Short Term Goal 5 (Week 1): Patient will report pain in right wrist reduced to 4/10 following gentle range/ mobilization  Skilled Therapeutic Interventions/Progress Updates:   Patient received supine in bed sleeping soundly.  Difficult to arouse initially.  Patient initially declined getting out of bed, but with time agreeable to try to get to recliner chair.  Worked on components of rolling left and right with mod assist.  Patient with report of discomfort with left hip in adduction/internal rotation to roll toward right.  Patient encouraged to move slowly, incrementally - to reduce symptoms of dizziness.  No nystagmus noted with slow movement, although patient using eye blink to apparently clear vision with movement.  Worked on lifting head from surface in rolling to assess vestibular symptoms.  Patient without complaint of dizziness.  Assisted patient to sit at edge of bed extremely slowly to reduce ataxic symptoms.  Once seated worked to quiet movement in hips and trunk.  Then worked on pivot transfer to recliner with max assist.  In recliner worked on moving back away from back of recliner in 1 in increments - as this rate was controllable.   Completed oral care at sink, and left up in recliner with safety belt in place and engaged and personal items in reach.    Therapy Documentation Precautions:  Precautions Precautions: Fall, Other  (comment) (DNR) Precaution/Restrictions Comments: Urinary incontinence, DNR Required Braces or Orthoses: Splint/Cast Splint/Cast: R wrist cock up splint Restrictions Weight Bearing Restrictions Per Provider Order: No Other Position/Activity Restrictions: as tolerates  Pain:  3/10 right wrist         Therapy/Group: Individual Therapy  Cayla Wiegand M 09/12/2023, 12:39 PM

## 2023-09-12 NOTE — Progress Notes (Signed)
 Daughter reports pt coughing after drinking fluid with small straw, does better with larger straw daughter reports. Nurse gave pt water with medication, no coughing observed.

## 2023-09-12 NOTE — Progress Notes (Signed)
   09/12/23 0010  BiPAP/CPAP/SIPAP  BiPAP/CPAP/SIPAP Pt Type Adult  BiPAP/CPAP/SIPAP Resmed  Mask Type Full face mask  Mask Size Medium  Respiratory Rate 16 breaths/min  EPAP  (6,18)  FiO2 (%) 21 %  Patient Home Machine No  Patient Home Mask No  Patient Home Tubing No  Auto Titrate Yes  Device Plugged into RED Power Outlet Yes  BiPAP/CPAP /SiPAP Vitals  Pulse Rate 64  Resp 16  SpO2 96 %  Bilateral Breath Sounds Clear

## 2023-09-13 LAB — CBC WITH DIFFERENTIAL/PLATELET
Abs Immature Granulocytes: 0.02 10*3/uL (ref 0.00–0.07)
Basophils Absolute: 0 10*3/uL (ref 0.0–0.1)
Basophils Relative: 0 %
Eosinophils Absolute: 0.1 10*3/uL (ref 0.0–0.5)
Eosinophils Relative: 2 %
HCT: 34.9 % — ABNORMAL LOW (ref 39.0–52.0)
Hemoglobin: 11.3 g/dL — ABNORMAL LOW (ref 13.0–17.0)
Immature Granulocytes: 0 %
Lymphocytes Relative: 14 %
Lymphs Abs: 0.8 10*3/uL (ref 0.7–4.0)
MCH: 32.8 pg (ref 26.0–34.0)
MCHC: 32.4 g/dL (ref 30.0–36.0)
MCV: 101.2 fL — ABNORMAL HIGH (ref 80.0–100.0)
Monocytes Absolute: 0.5 10*3/uL (ref 0.1–1.0)
Monocytes Relative: 9 %
Neutro Abs: 4.3 10*3/uL (ref 1.7–7.7)
Neutrophils Relative %: 75 %
Platelets: 325 10*3/uL (ref 150–400)
RBC: 3.45 MIL/uL — ABNORMAL LOW (ref 4.22–5.81)
RDW: 13 % (ref 11.5–15.5)
WBC: 5.7 10*3/uL (ref 4.0–10.5)
nRBC: 0 % (ref 0.0–0.2)

## 2023-09-13 LAB — GLUCOSE, CAPILLARY
Glucose-Capillary: 131 mg/dL — ABNORMAL HIGH (ref 70–99)
Glucose-Capillary: 180 mg/dL — ABNORMAL HIGH (ref 70–99)
Glucose-Capillary: 173 mg/dL — ABNORMAL HIGH (ref 70–99)
Glucose-Capillary: 184 mg/dL — ABNORMAL HIGH (ref 70–99)

## 2023-09-13 MED ORDER — INSULIN GLARGINE-YFGN 100 UNIT/ML ~~LOC~~ SOLN
3.0000 [IU] | Freq: Every day | SUBCUTANEOUS | Status: DC
  Administered 2023-09-13 – 2023-09-20 (×8): 3 [IU] via SUBCUTANEOUS
  Filled 2023-09-13 (×10): qty 0.03

## 2023-09-13 MED ORDER — SCOPOLAMINE 1 MG/3DAYS TD PT72
1.0000 | MEDICATED_PATCH | TRANSDERMAL | Status: DC
  Administered 2023-09-13 – 2023-09-25 (×5): 1.5 mg via TRANSDERMAL
  Filled 2023-09-13 (×5): qty 1

## 2023-09-13 NOTE — Progress Notes (Signed)
 Occupational Therapy Weekly Progress Note  Patient Details  Name: Allen Dyer MRN: [id number] Date of Birth: 30-Nov-1935  Beginning of progress report period: Sep 06, 2023 End of progress report period: Sep 13, 2023  Today's Date: 09/13/2023 OT Individual Time: 5621-3086 OT Individual Time Calculation (min): 64 min    Patient has met 1 of 5 short term goals.  Patient has had a significant setback functionally with reduced trunk control, increased truncal/ proximally oriented ataxia  Patient continues to demonstrate the following deficits: impaired timing and sequencing, abnormal tone, unbalanced muscle activation, ataxia, and decreased coordination, decreased visual perceptual skills, decreased motor planning, decreased initiation, decreased memory, and delayed processing, and decreased sitting balance, decreased standing balance, decreased postural control, and decreased balance strategies and therefore will continue to benefit from skilled OT intervention to enhance overall performance with BADL and Reduce care partner burden.  Patient not progressing toward long term goals.  See goal revision..  Plan of care revisions: lowering long term goals to represent functional decline.  OT Short Term Goals Week 1:  OT Short Term Goal 1 (Week 1): Patient will transfer to shower with min assist OT Short Term Goal 1 - Progress (Week 1): Not met OT Short Term Goal 2 (Week 1): Patient will transfer to toilet with min assist OT Short Term Goal 2 - Progress (Week 1): Not met OT Short Term Goal 3 (Week 1): Patient will dress upper body with set up assist OT Short Term Goal 3 - Progress (Week 1): Not met OT Short Term Goal 4 (Week 1): Patient will dress lower body with mod assist OT Short Term Goal 4 - Progress (Week 1): Not met OT Short Term Goal 5 (Week 1): Patient will report pain in right wrist reduced to 4/10 following gentle range/ mobilization OT Short Term Goal 5 - Progress (Week 1): Met Week  2:  OT Short Term Goal 1 (Week 2): Patient will assist with lower body dressing in bed by bridging/ half bridging OT Short Term Goal 2 (Week 2): Patient will safely sit at edge of bed with min assist when preparing for a trasnfer to wheelchair/recliner OT Short Term Goal 3 (Week 2): Patient will safely statically sit x 3 min in preparation for toileting activities OT Short Term Goal 4 (Week 2): Patient will complete hygiene at sink with set up assist OT Short Term Goal 5 (Week 2): Patient will feed himself and drink from a cup with intermittent min assist  Skilled Therapeutic Interventions/Progress Updates:    Patient received supine in bed.  Awakened from sleep easily, and agreeable to getting up out of bed.  Bathed lower body and changed brief, donned pants.  Patient able to assist by bridging/ partial rolling to remive old brief, replace brief/ clean bottom, pull pants over hips.  Patient without report of dizziness with these transitions.   Assisted patient to sitting at edge of bed - min assist for sitting balance - minimal ataxia noted initially.  Began to increase with time up in sitting.  Transferred modified squat pivot to recliner - moving toward left.   Sat in recliner to to feed self breakfast - very mild ataxi noted in limbs with self feeding - encouraged patient to keep one arm in contact with surface to reduce rocking.  Patient able to brush teeth at sink with min assist with emphasis on slow forward weight shift and use of other limbs to ground self.  Completed upper body dressing and left patient up in  recliner with safety belt in place and engaged and personal items in reach.    Therapy Documentation Precautions:  Precautions Precautions: Fall, Other (comment) (DNR) Precaution/Restrictions Comments: Urinary incontinence, DNR Required Braces or Orthoses: Splint/Cast Splint/Cast: R wrist cock up splint Restrictions Weight Bearing Restrictions Per Provider Order: No Other  Position/Activity Restrictions: as tolerates    Pain: Penis - 4/10       Therapy/Group: Individual Therapy  Navarre Diana M 09/13/2023, 8:42 AM

## 2023-09-13 NOTE — Progress Notes (Signed)
 Physical Therapy Session Note  Patient Details  Name: Allen Dyer MRN: [id number] Date of Birth: 06-27-1935  Today's Date: 09/13/2023 PT Individual Time: 4098-1191 PT Individual Time Calculation (min): 60 min  and Today's Date: 09/13/2023 PT Missed Time: 15 Minutes Missed Time Reason: Other (Comment) (family cutting his hair)  Short Term Goals: Week 1:  PT Short Term Goal 1 (Week 1): Pt will complete bed mobility with minA PT Short Term Goal 2 (Week 1): Pt will complete bed<>chair transfers with minA PT Short Term Goal 3 (Week 1): pt will ambulate 183ft with minA and LRAD PT Short Term Goal 4 (Week 1): Pt will navigate up/down x4 stairs with modA  Skilled Therapeutic Interventions/Progress Updates:      Pt missed the first 15 minutes of therapy - family shaving/trimming patient and requesting time to complete.   Pt sitting in recliner and no reports of pain. Family in room. Patient assisted to wheelchair with SB transfer with maxA overall using over the back technique. Needs cues for managing his legs to help keep adequate BOS. Drained foley bag due to begin full - documented in flow sheets.  Transported patient to day room gym and assisted onto Nustep with maxA +2 stand pivot transfer. Ataxia and rigidity limiting transfer. SetupA for BLE management and he completed a total of x10 minutes, very slow speed, with 170steps total. PT providing intermittent assist for AAROM to increase speed. Patient complaining of penal pain from his catheter - his RN was made aware. +2 maxA for stand pivot off of Nustep and back into wheelchair. Returned to his room and patient requesting to sit in the recliner with BLE elevated. MaxA +2 stand step transfer with cues for lateral stepping and general sequencing. Pt made comfortable in recliner with pillows and sheets. Call bell in lap.   Therapy Documentation Precautions:  Precautions Precautions: Fall, Other (comment) (DNR) Precaution/Restrictions  Comments: Urinary incontinence, DNR Required Braces or Orthoses: Splint/Cast Splint/Cast: R wrist cock up splint Restrictions Weight Bearing Restrictions Per Provider Order: No Other Position/Activity Restrictions: as tolerates General:     Therapy/Group: Individual Therapy  Pheobe Brass 09/13/2023, 7:55 AM

## 2023-09-13 NOTE — Plan of Care (Signed)
 Most goals downgraded due to significant decline in func Problem: RH Balance Goal: LTG: Patient will maintain dynamic sitting balance (OT) Description: LTG:  Patient will maintain dynamic sitting balance with assistance during activities of daily living (OT) Flowsheets (Taken 09/13/2023 1510) LTG: Pt will maintain dynamic sitting balance during ADLs with: Contact Guard/Touching assist Goal: LTG Patient will maintain dynamic standing with ADLs (OT) Description: LTG:  Patient will maintain dynamic standing balance with assist during activities of daily living (OT)  Flowsheets (Taken 09/13/2023 1510) LTG: Pt will maintain dynamic standing balance during ADLs with: Moderate Assistance - Patient 50 - 74%   Problem: Sit to Stand Goal: LTG:  Patient will perform sit to stand in prep for activites of daily living with assistance level (OT) Description: LTG:  Patient will perform sit to stand in prep for activites of daily living with assistance level (OT) Flowsheets (Taken 09/13/2023 1510) LTG: PT will perform sit to stand in prep for activites of daily living with assistance level: Moderate Assistance - Patient 50 - 74%   Problem: RH Eating Goal: LTG Patient will perform eating w/assist, cues/equip (OT) Description: LTG: Patient will perform eating with assist, with/without cues using equipment (OT) Flowsheets (Taken 09/13/2023 1510) LTG: Pt will perform eating with assistance level of: Minimal Assistance - Patient > 75%   Problem: RH Grooming Goal: LTG Patient will perform grooming w/assist,cues/equip (OT) Description: LTG: Patient will perform grooming with assist, with/without cues using equipment (OT) Flowsheets (Taken 09/13/2023 1510) LTG: Pt will perform grooming with assistance level of: Minimal Assistance - Patient > 75%   Problem: RH Bathing Goal: LTG Patient will bathe all body parts with assist levels (OT) Description: LTG: Patient will bathe all body parts with assist levels  (OT) Flowsheets (Taken 09/13/2023 1510) LTG: Pt will perform bathing with assistance level/cueing: Moderate Assistance - Patient 50 - 74% LTG: Position pt will perform bathing: At sink   Problem: RH Dressing Goal: LTG Patient will perform upper body dressing (OT) Description: LTG Patient will perform upper body dressing with assist, with/without cues (OT). Flowsheets (Taken 09/13/2023 1510) LTG: Pt will perform upper body dressing with assistance level of: Minimal Assistance - Patient > 75% Goal: LTG Patient will perform lower body dressing w/assist (OT) Description: LTG: Patient will perform lower body dressing with assist, with/without cues in positioning using equipment (OT) Flowsheets (Taken 09/13/2023 1510) LTG: Pt will perform lower body dressing with assistance level of: Moderate Assistance - Patient 50 - 74%   Problem: RH Toileting Goal: LTG Patient will perform toileting task (3/3 steps) with assistance level (OT) Description: LTG: Patient will perform toileting task (3/3 steps) with assistance level (OT)  Flowsheets (Taken 09/13/2023 1510) LTG: Pt will perform toileting task (3/3 steps) with assistance level: Moderate Assistance - Patient 50 - 74%   Problem: RH Toilet Transfers Goal: LTG Patient will perform toilet transfers w/assist (OT) Description: LTG: Patient will perform toilet transfers with assist, with/without cues using equipment (OT) Flowsheets (Taken 09/13/2023 1510) LTG: Pt will perform toilet transfers with assistance level of: Moderate Assistance - Patient 50 - 74%   Problem: RH Tub/Shower Transfers Goal: LTG Patient will perform tub/shower transfers w/assist (OT) Description: LTG: Patient will perform tub/shower transfers with assist, with/without cues using equipment (OT) Outcome: Not Applicable  tional status

## 2023-09-13 NOTE — Patient Care Conference (Signed)
 Inpatient RehabilitationTeam Conference and Plan of Care Update Date: 09/12/2023   Time: 1112 am    Patient Name: Allen Dyer      Medical Record Number: [id number]  Date of Birth: 12-29-1935 Sex: Male         Room/Bed: 4M02C/4M02C-01 Payor Info: Payor: MEDICARE / Plan: MEDICARE PART A AND B / Product Type: *No Product type* /    Admit Date/Time:  09/05/2023  6:19 PM  Primary Diagnosis:  Ischemic cerebrovascular accident (CVA) [facility])  Hospital Problems: Principal Problem:   Ischemic cerebrovascular accident (CVA) (HCC) Active Problems:   Unspecified symptoms and signs involving cognitive functions following cerebral infarction    Expected Discharge Date: Expected Discharge Date:  (pending)  Team Members Present: Physician leading conference: Dr. Laverle Postin Social Worker Present: Adrianna Albee, LCSW Nurse Present: Jerene Monks, RN PT Present: Oma Bias, PT OT Present: Celestino Cole, OT SLP Present: Dorla Gartner, SLP PPS Coordinator present : Jestine Moron, SLP     Current Status/Progress Goal Weekly Team Focus  Bowel/Bladder   Foley catheter due to bladder retention. Incontinent of bowels LBM 5/19   Pt remains free from S/S of constipation   Provide foley catheter care per order. Administered bowel meds per order.    Swallow/Nutrition/ Hydration   Dys3/thin liquid, single sips with no straws   min assist  diet upgrade, improve swallow timing with consecutive sips    ADL's   Initially max assist lower , mod assist uper body BADL, Currently total assist lower/ upper body BADL, Significant ataxia, dizziness, dysarthria   Min assist   Improve ability to participate in rehab program    Mobility   maxA bed mobility, maxA SB transfers. Was ambulating >1110ft with minA using EVA walker before Friday (5.16) but since change in status has not been safely ambulating.   CGA - will likely need downgraded to at least minA  postural control, ataxia management,  functional transfers, standing balance, gait training    Communication   ~50% intelligible at the conversation level c/b imprecise articulation, hyponasality, strained vocal quality, impaired respiratory support for speech   min assist   introduction and use of speech intelligibility strategies    Safety/Cognition/ Behavioral Observations  mod deficits in problem solving, memory, attention, awareness, orientation   min   problem solving, memory, attention, awareness, orientation    Pain   Denies pain this shift.   PT states pain <3 out of 10.   Assess pain q shift and PRN. Administer medication as ordered.    Skin   Skin intact   Remains free from s/s of skin breakdown  Assess skin q shift and PRN.      Discharge Planning:  HOme with daughter and son in-law who can assist. Daughter works from home. Pt hopes tpo be doing better than therapists think he will    Team Discussion: Patient was admitted post bilateral cerebellar infarct from progression of other strokes. Patient limited by dysphagia,worsening ataxia and dysarthria and right facial sensory loss.  Patient on target to meet rehab goals: no, currently goals are downgraded patient needing total assist with ADLs due to truncal ataxia. Patient able to transfer with slide board with mod-max assist. Patient not safe to ambulate.Overall goals at discharge are set for minimal assistance.  *See Care Plan and progress notes for long and short-term goals.   Revisions to Treatment Plan:  EVA walker Swallow Study   Teaching Needs: Safety, toileting, transfers, medications, aspiration precautions , etc.   Current  Barriers to Discharge: Decreased caregiver support, Home enviroment access/layout, and Incontinence  Possible Resolutions to Barriers:  Family Education     Medical Summary Current Status: wallerian degeneration, truncal ataxia, urinary retention, CKD, anemia  Barriers to Discharge: Medical stability  Barriers  to Discharge Comments: wallerian degeneration, truncal ataxia, urinary retention, CKD, anemia Possible Resolutions to [employer] Focus: repeat MRI and CT obtained, neurology consulted, continue foley, continue to monitor creatinine, stool occult and Hgb ordered   Continued Need for Acute Rehabilitation Level of Care: The patient requires daily medical management by a physician with specialized training in physical medicine and rehabilitation for the following reasons: Direction of a multidisciplinary physical rehabilitation program to maximize functional independence : Yes Medical management of patient stability for increased activity during participation in an intensive rehabilitation regime.: Yes Analysis of laboratory values and/or radiology reports with any subsequent need for medication adjustment and/or medical intervention. : Yes   I attest that I was present, lead the team conference, and concur with the assessment and plan of the team.   Reily Treloar Gayo 09/13/2023, 1112am

## 2023-09-13 NOTE — Progress Notes (Signed)
 PROGRESS NOTE   Subjective/Complaints: No new complaints this morning Appreciate SW updating family Drooling has become fairly constant as per SLP  ROS: +right wrist pain, +dizziness--ongoing Denies constipation, HA, SOB, CP, +RUE edema, +pain from foley, +drooling  Objective:   DG Swallowing Func-Speech Pathology Result Date: 09/12/2023 Table formatting from the original result was not included. Modified Barium Swallow Study Patient Details Name: Allen Dyer MRN: [id number] Date of Birth: [birth year] Today's Date: 09/12/2023 HPI/PMH: HPI: Allen Dyer is an 88 year old right-handed male with history significant for chronic combined systolic and diastolic heart failure, nonischemic cardiomyopathy, CVA, prostate cancer status post XRT/BPH, GERD, gout, multiple myeloma, nonobstructive CAD, hyperlipidemia, hypertension, diabetes mellitus with peripheral neuropathy and CKD stage IV. Presented 09/02/2023 with progressive unsteady gait times several weeks as well as falls x 2.  MRI showed patchy early subacute ischemic infarcts involving the right greater than left cerebellar hemispheres.  Associated minimal petechial blood products of the right cerebellum without hemorrhagic transformation. 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  Irregular reconstitution of the distal V2 segment of the right vertebral artery with occlusion of the right V3 and V4 segments.  Left vertebral artery occluded at the V2 segment from the level of C4-5.  CT cervical spine showed hypodense lesions in the cerebellar hemispheres, measuring 3.4 x 3.2 cm on the right 0.8 cm and 1.8 x 1.1 cm on the left. Was tolerating a regular consistency diet until 09/07/23 when patient developed significantly worsening motor speech and began coughing with thin liquids. Clinical Impression: Clinical Impression: Patient presents with mild oropharyngeal dysphagia primarily characterized  by reductions to pharyngeal efficiency and safety d/t mistiming of the swallow with thin liquids. Orally, patient demonstrates weakness/numbness in the right cheek and lips resulting in anterior spillage with all liquids administered, escaping to mid chin. Pharyngeally, patient demosntrated reduction in base of tongue retraction to the posterior pharyngeal wall and reduced pharyngeal stripping wave resulting in residue in the vallecula and pyriform sinuses which mostly cleared with subsequent swallows. Once instance of aspiration was observed with consecutive cup sips of thin liquids, however suspect this is happening intermittently throughout the day given cough response after consecutive sips at bedside throughout evaluation and reports from daughter/nursing. Aspiration was secondary to mistiming and inefficiency as bolus material fell to the pyriform sinuses before the swallow, and material from the initial swallow was not fully cleared prior to next swallow resulting in overflow and resultant spillage into the trachea. Cough response was activated but was not effective. Notably at bedside, patient has been noted to exhibit intermittent instances of oral holding and residue though not observed on today's study, thus recommend Dys3/thin liquids utilizing SINGLE SIPS strategy with liquid. NO STRAWS as patient demonstrated reduced cognitive ability to implement single sip strategies when straw was utilized. Administer medications whole in puree for ease. SLP will continue to follow. Factors that may increase risk of adverse event in presence of aspiration [name] & [name] 2021): Factors that may increase risk of adverse event in presence of aspiration [name] & [name] 2021): Weak cough; Frail or deconditioned; Limited mobility; Reduced cognitive function Recommendations/Plan: Swallowing Evaluation Recommendations Swallowing Evaluation Recommendations Recommendations: PO diet PO Diet Recommendation: Dysphagia  3  (Mechanical soft); Thin liquids (Level 0) Liquid Administration via: Cup; No straw Medication Administration: Whole meds with puree Supervision: Full supervision/cueing for swallowing strategies Swallowing strategies  : Slow rate; Small bites/sips; Check for pocketing or oral holding Postural changes: Position pt fully upright for meals Oral care recommendations: Oral care BID (2x/day) Treatment Plan Treatment Plan Treatment recommendations: Therapy as outlined in treatment plan below Follow-up recommendations: Outpatient SLP Functional status assessment: Patient has had a recent decline in their functional status and demonstrates the ability to make significant improvements in function in a reasonable and predictable amount of time. Treatment frequency: Min 2x/week Treatment duration: 2 weeks Interventions: Aspiration precaution training; Oropharyngeal exercises; Compensatory techniques; Patient/family education; Diet toleration management by SLP; Trials of upgraded texture/liquids; Respiratory muscle strength training Recommendations Recommendations for follow up therapy are one component of a multi-disciplinary discharge planning process, led by the attending physician.  Recommendations may be updated based on patient status, additional functional criteria and insurance authorization. Assessment: Orofacial Exam: Orofacial Exam Oral Cavity: Oral Hygiene: WFL Oral Cavity - Dentition: Dentures, not available; Missing dentition; Poor condition Anatomy: Anatomy: WFL Boluses Administered: Boluses Administered Boluses Administered: Thin liquids (Level 0); Mildly thick liquids (Level 2, nectar thick); Moderately thick liquids (Level 3, honey thick); Puree; Solid  Oral Impairment Domain: Oral Impairment Domain Lip Closure: Escape progressing to mid-chin Tongue control during bolus hold: Cohesive bolus between tongue to palatal seal Bolus preparation/mastication: Timely and efficient chewing and mashing Bolus  transport/lingual motion: Brisk tongue motion Oral residue: Residue collection on oral structures Location of oral residue : Tongue; Palate Initiation of pharyngeal swallow : Pyriform sinuses  Pharyngeal Impairment Domain: Pharyngeal Impairment Domain Soft palate elevation: No bolus between soft palate (SP)/pharyngeal wall (PW) Laryngeal elevation: Complete superior movement of thyroid  cartilage with complete approximation of arytenoids to epiglottic petiole Anterior hyoid excursion: Complete anterior movement Epiglottic movement: Complete inversion Laryngeal vestibule closure: Incomplete, narrow column air/contrast in laryngeal vestibule Pharyngeal stripping wave : Present - diminished Pharyngeal contraction (A/P view only): N/A Pharyngoesophageal segment opening: Complete distension and complete duration, no obstruction of flow Tongue base retraction: Narrow column of contrast or air between tongue base and PPW Pharyngeal residue: Collection of residue within or on pharyngeal structures Location of pharyngeal residue: Valleculae; Pyriform sinuses  Esophageal Impairment Domain: Esophageal Impairment Domain Esophageal clearance upright position: Complete clearance, esophageal coating Pill: Pill Consistency administered: Thin liquids (Level 0) Thin liquids (Level 0): [facility] Penetration/Aspiration Scale Score: Penetration/Aspiration Scale Score 1.  Material does not enter airway: Mildly thick liquids (Level 2, nectar thick); Moderately thick liquids (Level 3, honey thick); Puree; Solid; Pill 7.  Material enters airway, passes BELOW cords and not ejected out despite cough attempt by patient: Thin liquids (Level 0) Compensatory Strategies: Compensatory Strategies Compensatory strategies: Yes Other(comment): Effective (single sips) Effective Other(comment): Thin liquid (Level 0)   General Information: Caregiver present: No  Diet Prior to this Study: Regular; Thin liquids (Level 0)   Temperature : Normal   Respiratory  Status: WFL   Supplemental O2: None (Room air)   History of Recent Intubation: No  Behavior/Cognition: Alert; Cooperative; Requires cueing Self-Feeding Abilities: Needs assist with self-feeding Baseline vocal quality/speech: Hypophonia/low volume Volitional Cough: Able to elicit Volitional Swallow: Able to elicit Exam Limitations: No limitations Goal Planning: Prognosis for improved oropharyngeal function: Good Barriers to Reach Goals: Cognitive deficits No data recorded No data recorded Consulted and agree with results and recommendations: Patient Pain: Pain Assessment Pain Assessment: No/denies pain End of Session: Start Time:No  data recorded Stop Time: No data recorded Time Calculation:No data recorded Charges: No data recorded SLP visit diagnosis: SLP Visit Diagnosis: Dysphagia, oropharyngeal phase (R13.12) Past Medical History: Past Medical History: Diagnosis Date  Chronic combined systolic and diastolic CHF (congestive heart failure) (HCC)   CKD (chronic kidney disease), stage III (HCC)   Hyperlipidemia   Hypertension   Lower extremity edema   Mild CAD   a. mild-mod by cath 05/2018.  Multiple myeloma (HCC) 11/15/2018  NICM (nonischemic cardiomyopathy) (HCC)   Peripheral neuropathy   Prostate cancer (HCC)   Status post XRT  Rheumatic fever   Stroke (HCC)   Trifascicular block   Type 2 diabetes mellitus (HCC)   Vertigo  Past Surgical History: Past Surgical History: Procedure Laterality Date  RIGHT HEART CATH N/A 07/10/2018  Procedure: RIGHT HEART CATH;  Surgeon: Darlis Eisenmenger, MD;  Location: [facility] INVASIVE CV LAB;  Service: Cardiovascular;  Laterality: N/A;  RIGHT/LEFT HEART CATH AND CORONARY ANGIOGRAPHY N/A 06/18/2018  Procedure: RIGHT/LEFT HEART CATH AND CORONARY ANGIOGRAPHY;  Surgeon: Millicent Ally, MD;  Location: [facility] INVASIVE CV LAB;  Service: Cardiovascular;  Laterality: N/A; Ashley A Ellin 09/12/2023, 9:51 AM  MR BRAIN WO CONTRAST Result Date: 09/12/2023 CLINICAL DATA:  Initial evaluation for acute altered  mental status. EXAM: MRI HEAD WITHOUT CONTRAST TECHNIQUE: Multiplanar, multiecho pulse sequences of the brain and surrounding structures were obtained without intravenous contrast. COMPARISON:  CT from earlier the same day as well as MRI from 09/08/2023 FINDINGS: Brain: Age-related cerebral atrophy with mild chronic microvascular ischemic disease. Multiple scattered remote cerebellar infarcts. Remote lacunar infarct at the posterior right lentiform nucleus. Evolving subacute infarct within the right cerebellum with mild residual diffusion signal abnormality, slightly less prominent from prior. Additional well-circumscribed foci of restricted diffusion involving the middle cerebellar peduncles again seen, similar to prior, and could reflect ischemic infarcts and/or evolving changes of wallerian degeneration as previously noted. No associated hemorrhage or significant regional mass effect. No other evidence for acute or interval infarction. Gray-white matter differentiation otherwise maintained. No acute or chronic intracranial blood products. No mass lesion, midline shift or mass effect. No hydrocephalus or extra-axial fluid collection. Pituitary gland and suprasellar region within normal limits. Vascular: Irregular flow voids throughout the vertebrobasilar system, similar to prior, and better evaluated on prior CTA. Anterior arterial intracranial flow voids are well maintained. Skull and upper cervical spine: Craniocervical junction within normal limits. Bone marrow signal intensity within normal limits. No scalp soft tissue abnormality. Sinuses/Orbits: Prior bilateral ocular lens replacement. Paranasal sinuses remain largely clear. Trace right mastoid effusion, of doubtful significance. Other: None. IMPRESSION: 1. Persistent foci of restricted diffusion involving the middle cerebellar peduncles, similar to prior, and could reflect ischemic infarcts and/or evolving changes of Wallerian degeneration as previously  noted. 2. Evolving subacute infarct within the right cerebellum, slightly less prominent as compared to prior. No associated hemorrhage or significant regional mass effect. 3. No other new acute intracranial abnormality. 4. Underlying age-related cerebral atrophy with mild chronic microvascular ischemic disease, with multiple remote cerebellar and right basal ganglia infarcts. Electronically Signed   By: Virgia Griffins M.D.   On: 09/12/2023 02:11   CT HEAD WO CONTRAST ( ) Result Date: 09/11/2023 CLINICAL DATA:  Initial evaluation for acute altered mental status. EXAM: CT HEAD WITHOUT CONTRAST TECHNIQUE: Contiguous axial images were obtained from the base of the skull through the vertex without intravenous contrast. RADIATION DOSE REDUCTION: This exam was performed according to the departmental dose-optimization program which includes automated exposure control, adjustment  of the mA and/or kV according to patient size and/or use of iterative reconstruction technique. COMPARISON:  CT from 09/10/2023 and MRI from 09/08/2023 FINDINGS: Brain: Age-related cerebral atrophy with chronic microvascular ischemic disease. Multiple remote cerebellar infarcts again noted. Well-circumscribed hypodensities involving the cerebellum/middle cerebellar peduncles, consistent with edema, likely due to evolving wallerian degeneration and/or ischemia. Overall appearance is not significantly changed from previous exams. No other acute large vessel territory infarct. No acute intracranial hemorrhage. No mass lesion or midline shift. No hydrocephalus or extra-axial fluid collection. Vascular: No abnormal hyperdense vessel. Calcified atherosclerosis present at the skull base. Skull: Scalp soft tissues within normal limits.  Calvarium intact. Sinuses/Orbits: Globes and orbital soft tissues demonstrate no acute finding. Paranasal sinuses are largely clear. No mastoid effusion. Other: None. IMPRESSION: 1. Stable head CT with no  significant interval change in appearance of the brain as compared to previous exams. 2. Evolving hypodensities involving the cerebellum/middle cerebellar peduncles, consistent with evolving wallerian degeneration and/or ischemia, stable from prior. 3. No other new acute intracranial abnormality. 4. Underlying atrophy with chronic microvascular ischemic disease. Electronically Signed   By: Virgia Griffins M.D.   On: 09/11/2023 22:50    Recent Labs    09/12/23 0459 09/13/23 0640  WBC 6.4 5.7  HGB 11.1* 11.3*  HCT 34.1* 34.9*  PLT 330 325   Recent Labs    09/11/23 1407 09/12/23 0459  NA 137  --   K 4.4  --   CL 100  --   CO2 23  --   GLUCOSE 272*  --   BUN 45*  --   CREATININE 2.17* 2.00*  CALCIUM  9.7  --     Intake/Output Summary (Last 24 hours) at 09/13/2023 1433 Last data filed at 09/13/2023 0700 Gross per 24 hour  Intake 240 ml  Output 1800 ml  Net -1560 ml        Physical Exam: Vital Signs Blood pressure 131/69, pulse 70, temperature 97.9 F (36.6 C), temperature source Oral, resp. rate 16, height 5\' 7"  (1.702 m), weight 92.6 kg, SpO2 99%.  Gen: no distress, normal appearing, sitting in chair, appears comfortable  HEENT: oral mucosa pink and moist, NCAT.  Severe nystagmus and bilateral lateral gaze. Cardio: Reg rate Chest: CTAB, normal effort, normal rate of breathing Abd: soft, non-distended, NT Ext: R arm swelling- continues GU: clear yellow urine in bag Psych: pleasant, normal affect Skin: intact; mild right upper extremity swelling/edema  Neuro: dysarthia present--approximately 50% intelligible., improved comprehension today and ability to verbalize wants, stable 5/22   No tone or spasticity noted.  Cranial nerves grossly intact.   MSK: R arm in splint     Assessment/Plan: 1. Functional deficits which require 3+ hours per day of interdisciplinary therapy in a comprehensive inpatient rehab setting. Physiatrist is providing close team  supervision and 24 hour management of active medical problems listed below. Physiatrist and rehab team continue to assess barriers to discharge/monitor patient progress toward functional and medical goals  Care Tool:  Bathing    Body parts bathed by patient: Right arm, Chest, Abdomen, Face   Body parts bathed by helper: Left arm, Front perineal area, Buttocks, Left lower leg, Right lower leg, Left upper leg, Right upper leg     Bathing assist Assist Level: Maximal Assistance - Patient 24 - 49%     Upper Body Dressing/Undressing Upper body dressing   What is the patient wearing?: Pull over shirt    Upper body assist Assist Level: Maximal Assistance - Patient  25 - 49%    Lower Body Dressing/Undressing Lower body dressing      What is the patient wearing?: Skirt, Incontinence brief     Lower body assist Assist for lower body dressing: Total Assistance - Patient < 25%     Toileting Toileting Toileting Activity did not occur (Clothing management and hygiene only): N/A (no void or bm)  Toileting assist Assist for toileting: Total Assistance - Patient < 25%     Transfers Chair/bed transfer  Transfers assist     Chair/bed transfer assist level: Maximal Assistance - Patient 25 - 49%     Locomotion Ambulation   Ambulation assist      Assist level: Moderate Assistance - Patient 50 - 74% Assistive device: Hand held assist Max distance: 8ft   Walk 10 feet activity   Assist     Assist level: Moderate Assistance - Patient - 50 - 74% Assistive device: Hand held assist   Walk 50 feet activity   Assist    Assist level: Minimal Assistance - Patient > 75% Assistive device: Walker-Eva    Walk 150 feet activity   Assist    Assist level: Minimal Assistance - Patient > 75% Assistive device: Walker-Eva    Walk 10 feet on uneven surface  activity   Assist Walk 10 feet on uneven surfaces activity did not occur: Safety/medical concerns          Wheelchair     Assist Is the patient using a wheelchair?: Yes Type of Wheelchair: Manual    Wheelchair assist level: Dependent - Patient 0%      Wheelchair 50 feet with 2 turns activity    Assist        Assist Level: Dependent - Patient 0%   Wheelchair 150 feet activity     Assist      Assist Level: Dependent - Patient 0%   Blood pressure 131/69, pulse 70, temperature 97.9 F (36.6 C), temperature source Oral, resp. rate 16, height 5\' 7"  (1.702 m), weight 92.6 kg, SpO2 99%.    Medical Problem List and Plan: 1. Functional deficits secondary to bilateral cerebellar infarct, right more than left secondary large vessel disease from severe posterior circulation stenosis             -patient may shower             -ELOS/Goals: 10-12,PT/OT sup to mod I             -Grounds pass ordered  -MRI repeat 5/17, was discussed with neurology Dr. Bonnita Buttner , not acute cva but some progression of prior strokes. No additional changes, avoid hypotension   -Consult SLP- worsened dysarthria over past few days  - 5-19: Multiple messages from therapist and nursing concerns regarding worsening dysarthria, ataxia, and right facial sensory loss.  No new focal deficits on exam, however symptoms did not improve with medication adjustments.  Stat CT head without acute findings.  No hemorrhagic transformation or mass effect.  2.  Antithrombotics: -DVT/anticoagulation:  Pharmaceutical: Lovenox              -antiplatelet therapy: Aspirin  81 mg daily and Plavix  75 mg day x 3 months then Plavix  alone 3. Pain Management: Neurontin  300 mg twice daily--well-controlled 4. Mood/Behavior/Sleep: Provide emotional support             -antipsychotic agents: N/A 5. Neuropsych/cognition: This patient is capable of making decisions on his own behalf.  -5/18 start lexapro  for mood, consider neuropsych consult  5/19: DC lexapro   d/t worsening lethargy, dysarthria  6. Skin/Wound Care: Routine skin  checks  5-19: Right upper extremity duplex ordered prior for swelling negative for DVT, however with multiple cystic structures concerning for abscess.  CT recommended for further assessment, order placed.  No current fevers or leukocytosis to indicate infection.  7. Fluids/Electrolytes/Nutrition: Routine in and outs with follow-up chemistries 8.  Klebsiella oxytoca UTI.  Completing course of Duricef  - Per urology note, is to continue on cefadroxil  1 tab p.o. nightly for CAUTI prophylaxis while Foley is in place  9.  CKD stage IV.  Discussed that creatinine is improving  -Cr overalls stable 2.47/BUN 50- monitor  5-19: BUN/creatinine stable. 10.  Chronic combined systolic and diastolic congestive heart failure. EF 25-30% Monitor for any signs of fluid overload.  Follow-up cardiology services.  Continue Demadex  60 mg twice daily, weight reviewed and is increased, daily weights ordered, weights are stable/increased, continue current regimen  5/17 wt trending up although suspect may be inaccurate, no signs of overload noted  5/18 Wt appears stable, monitor  5/19: weights downtrending, ? Stroke recrudescence with normotension d/t stenosis, reduce torsemide  to 60 mg daily  5/21: Cr reviewed and is improving  Filed Weights   09/10/23 0500 09/11/23 0500 09/12/23 0520  Weight: 93.5 kg 93.7 kg 92.6 kg     11.  Nonischemic cardiomyopathy.  Followed by cardiology service Dr. Micael Adas.  Continue Imdur  30 mg daily and Coreg  6.25 mg twice daily.  See #10.  12.  History of prostate cancer, BPH.  Followed by Dr. Freddi Jaeger.  Flomax  0.4 mg twice daily             -Check PVR/bladder scan  5/17-8 he has foley in placed, clear yellow urine  Discussed trial of void but patient prefers foley at this time  13.  Diabetes mellitus with peripheral neuropathy and hemoglobin A1c 7.0.  Glucotrol  5 mg daily  -5/18 few elevated CBGs, continue to monitor trend for now  5-19: BG is generally well-controlled, monitor  CBG  (last 3)  Recent Labs    09/12/23 2044 09/13/23 0650 09/13/23 1126  GLUCAP 249* 131* 180*     14. HTN. Long term goal normotensive. Home meds carvedilol , imdur , toresemide  -5/18 would like avoid hypotension, decrease Imdur  to 15 mg, decrease Coreg  to 3.125 twice daily, decrease torsemide  to daily  5/19: Weight down, BP normotensive but may be underperfusing; reduce torsemide  as above. Can consider reducing flomax  as well pending DC foley trial but cannot stay off long term d/t chronic retention.  Vitals:   09/13/23 0550 09/13/23 1350  BP: 138/88 131/69  Pulse: 60 70  Resp: 18 16  Temp: 98.2 F (36.8 C) 97.9 F (36.6 C)  SpO2: 98% 99%      15. Obesity. Body mass index is 33.67 kg/m.             -dietary education  16. R wrist pain due to fall. Wearing wrist brace. Xray without fracture. Voltaren  gel scheduled             -tylenol  prn  -5/17 Will check vas US  due to R arm swelling  5-19: See above; no DVT, possible abscesses/cystic structures.    5/20: CT reviewed and shows age indeterminate fracture, will consult ortho  17. Constipation: D/c miralax , d/c [place], encouraged prune juice, asked nursing to bring prune juice for him  Last bowel movement 5/21  18. Dizziness/vertigo/nystagmus: vestibular eval ordered   Decrease meclizine  to daily given lethargy.  Likely  worsening secondary to wallerian degeneration.  Scopolamine patch added  19. Anemia: Hgb reviewed and is slightly improved  20. AMS: MRI brain and CT Head obtained without acute concerning findings, have consulted neurology to review, does appear better today  21. Drooling: scopolamine patch added  22. Lethargy: modafinil ordered    LOS: 8 days A FACE TO FACE EVALUATION WAS PERFORMED  Liam Redhead 09/13/2023, 2:33 PM

## 2023-09-13 NOTE — Progress Notes (Signed)
 Physical Therapy Weekly Progress Note  Patient Details  Name: Allen Dyer MRN: [id number] Date of Birth: 07/25/1935  Beginning of progress report period: Sep 06, 2023 End of progress report period: Sep 13, 2023  Patient has met 0 of 4 short term goals.  Unforuntately, [name] has had a setback medically that has affected his functional mobility. 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 Pt is now demonstrating severe ataxia (L >R) and truncal ataxia. He's also having more speech deficits with swallowing impairments. He's been incontinent of BM and now has a catheter. Concern for achieving LTG of minA and will likely need downgraded to modA.   Patient continues to demonstrate the following deficits muscle weakness and muscle joint tightness, decreased cardiorespiratoy endurance, impaired timing and sequencing, abnormal tone, unbalanced muscle activation, motor apraxia, ataxia, decreased coordination, and decreased motor planning, decreased visual perceptual skills and decreased visual motor skills, decreased motor planning, decreased initiation, decreased attention, decreased awareness, decreased problem solving, decreased safety awareness, and delayed processing, central origin, and decreased sitting balance, decreased standing balance, decreased postural control, and decreased balance strategies and therefore will continue to benefit from skilled PT intervention to increase functional independence with mobility.  Patient not progressing toward long term goals.  Monitoring need to downgrade goals as needed.  PT Short Term Goals Week 1:  PT Short Term Goal 1 (Week 1): Pt will complete bed mobility with minA PT Short Term Goal 1 - Progress (Week 1): Not met PT Short Term Goal 2 (Week 1): Pt will complete bed<>chair transfers with minA PT Short Term Goal 2 - Progress (Week 1): Not met PT Short Term Goal 3 (Week 1): pt will  ambulate 144ft with minA and LRAD PT Short Term Goal 3 - Progress (Week 1): Not met PT Short Term Goal 4 (Week 1): Pt will navigate up/down x4 stairs with modA PT Short Term Goal 4 - Progress (Week 1): Not met PT Short Term Goal 5 (Week 1): Pt will complete bed mobility with modA Week 2:  PT Short Term Goal 1 (Week 2): Pt will complete bed<>chair transfers with maxA PT Short Term Goal 2 (Week 2): Pt will propel wheelchair ~67ft with modA   Therapy Documentation Precautions:  Precautions Precautions: Fall, Other (comment) (DNR) Precaution/Restrictions Comments: Urinary incontinence, DNR Required Braces or Orthoses: Splint/Cast Splint/Cast: R wrist cock up splint Restrictions Weight Bearing Restrictions Per Provider Order: No Other Position/Activity Restrictions: as tolerates General:      Therapy/Group: Individual Therapy  Om Lizotte P Jedrek Dinovo 09/13/2023, 10:15 AM

## 2023-09-13 NOTE — Plan of Care (Signed)
  Problem: Consults Goal: RH STROKE PATIENT EDUCATION Description: See Patient Education module for education specifics  Outcome: Progressing   Problem: RH BOWEL ELIMINATION Goal: RH STG MANAGE BOWEL WITH ASSISTANCE Description: STG Manage Bowel with supervision Assistance. Outcome: Progressing   Problem: RH BOWEL ELIMINATION Goal: RH STG MANAGE BOWEL W/MEDICATION W/ASSISTANCE Description: STG Manage Bowel with Medication with Assistance. Outcome: Progressing   Problem: RH BLADDER ELIMINATION Goal: RH STG MANAGE BLADDER WITH ASSISTANCE Description: STG Manage Bladder With supervision Assistance Outcome: Progressing   Problem: RH SKIN INTEGRITY Goal: RH STG SKIN FREE OF INFECTION/BREAKDOWN Description: Manage skin free of infection/breakdown with supervision assistance Outcome: Progressing   Problem: RH KNOWLEDGE DEFICIT Goal: RH STG INCREASE KNOWLEGDE OF HYPERLIPIDEMIA Description: Manage knowledge of hyperlipidemia with supervision assistance from daughter using educational materials provided Outcome: Progressing   Problem: RH KNOWLEDGE DEFICIT Goal: RH STG INCREASE KNOWLEDGE OF HYPERTENSION Description: Manage knowledge of hypertension with supervision assistance from daughter using educational materials provided Outcome: Progressing   Problem: RH KNOWLEDGE DEFICIT Goal: RH STG INCREASE KNOWLEDGE OF DIABETES Description: Manage knowledge of diabetes with supervision assistance from daughter using educational materials provided Outcome: Progressing   Problem: RH PAIN MANAGEMENT Goal: RH STG PAIN MANAGED AT OR BELOW PT'S PAIN GOAL Description: <4 w/ prns Outcome: Progressing   Problem: RH COGNITION-NURSING Goal: RH STG USES MEMORY AIDS/STRATEGIES W/ASSIST TO PROBLEM SOLVE Description: STG Uses Memory Aids/Strategies With supervision Assistance to Problem Solve. Outcome: Progressing

## 2023-09-13 NOTE — Progress Notes (Signed)
 Patient ID: Allen Dyer, male   DOB: 1936/03/18, 88 y.o.   MRN: [id number]  Spoke with daughter-Veronica via telephone to discuss team conference and since his extension of his CVA will need to re-group for goals and LOS. Will wait until next week at team conference to decide target Dc date. Made daughter aware her Dad will need 24/7 physical care at discharge. She has bene here and seen him in therapies. Work on discharge needs.

## 2023-09-13 NOTE — Progress Notes (Signed)
 Speech Language Pathology Daily Session Note  Patient Details  Name: Allen Dyer MRN: [id number] Date of Birth: 08-03-1935  Today's Date: 09/13/2023 SLP Individual Time: 0865-7846 SLP Individual Time Calculation (min): 57 min  Short Term Goals: Week 1: SLP Short Term Goal 1 (Week 1): Patient will complete MBS to determine least restrictive safe diet. SLP Short Term Goal 2 (Week 1): Patient will orient to date utilizing external aids when provided with mod verbal cues. SLP Short Term Goal 3 (Week 1): Patient will selectively attend to speech therapy tasks for 10 minute intervals given mod assist. SLP Short Term Goal 4 (Week 1): Patient will recall pertinent daily information with 80% accuracy utilizing external/internal aids when given mod assist. SLP Short Term Goal 5 (Week 1): Patient will solve basic environmental problems with 80% accuracy given mod assist. SLP Short Term Goal 6 (Week 1): Patient will utilize speech intelligibility and communication repair strategies in 8/10 opportunities at the phrase level given mod assist.  Skilled Therapeutic Interventions: SLP conducted skilled therapy session targeting dysphagia goals. SLP introduced EMST. Patient's maximum MEP measured at 30 cmH2O, though patient more hindered by reduced labial strength than respiratory strength. SLP set target threshold to 20 cm H2O, then patient completed 5 sets of 5 given mod cues to engage abdominal muscles to promote stronger bursts of breath. SLP then assessed tolerance of thin liquids. Patient's swallowing appears to be impaired further compared to previous session, now coughing more frequently and exhibiting increased anterior spillage/drooling throughout session. SLP encouraged small sips, however, patient unable to cognitively execute strategy. Provided patient with 10cc provale cup with no further overt s/sx of penetration/aspiration observed. Alerted nursing to change in recommendation for liquid  administration. Patient was left in room with call bell in reach and alarm set. SLP will continue to target goals per plan of care.        Pain Pain Assessment Pain Scale: Faces Pain Score: 6   Therapy/Group: Individual Therapy  Bayley Hurn, M.A., CCC-SLP  Siarra Gilkerson A Korayma Hagwood 09/13/2023, 4:13 PM

## 2023-09-14 LAB — GLUCOSE, CAPILLARY
Glucose-Capillary: 109 mg/dL — ABNORMAL HIGH (ref 70–99)
Glucose-Capillary: 154 mg/dL — ABNORMAL HIGH (ref 70–99)
Glucose-Capillary: 117 mg/dL — ABNORMAL HIGH (ref 70–99)
Glucose-Capillary: 138 mg/dL — ABNORMAL HIGH (ref 70–99)

## 2023-09-14 LAB — OCCULT BLOOD X 1 CARD TO LAB, STOOL: Fecal Occult Bld: NEGATIVE

## 2023-09-14 MED ORDER — PSYLLIUM 95 % PO PACK
1.0000 | PACK | Freq: Every day | ORAL | Status: DC
  Administered 2023-09-14 – 2023-09-24 (×11): 1 via ORAL
  Filled 2023-09-14 (×11): qty 1

## 2023-09-14 NOTE — Progress Notes (Signed)
 Physical Therapy Session Note  Patient Details  Name: Allen Dyer MRN: [id number] Date of Birth: 12-19-1935  Today's Date: 09/14/2023 PT Individual Time: 4098-1191 + 1445-1500 PT Individual Time Calculation (min): 30 min + 15 min (missed 30 minutes due to fatigue)  Short Term Goals: Week 2:  PT Short Term Goal 1 (Week 2): Pt will complete bed<>chair transfers with maxA PT Short Term Goal 2 (Week 2): Pt will propel wheelchair ~85ft with modA  Skilled Therapeutic Interventions/Progress Updates:      1st session: Direct handoff of care from NT via Stedy transferring patient back to wheelchair. Patient denies any pain.   Transported patient to main gym at wheelchair level. Patient having sudden onset of truncal ataxia with some stop/go at wheelchair level, manages itself after a few seconds.   Placed outside // bars while facing mirror to work on his sit<>stands. Patient requires modA for sit<>stand while pulling to stand. Cues needed for tucking his feet underneath him prior to standing. Patient with significant truncal ataxia and rocking motion in standing, but only needing minA for standing balance. Standing tolerance up to 1-2 minutes before needing to sit. Completed several repetitions of this, working on truncal ataxia and standing balance.   Returned to his room and patient left sitting up in wheelchair with needs met.    2nd session: Direct handoff of care from NT who just finished assisting patient to the bathroom. Patient positioned in wheelchair and reports no pain or discomfort but does report significant fatigue and pleasantly requests to return to bed to rest. Pt completed a squat pivot transfer with max/totalA from w/c to EOB with significant ataxia, delayed initiation, and rigidity in pelvis/hips during transfer. Patient also kicks his BLE out into extension and has difficulty keeping feet underneath him for BOS during transfer. Pt required totalA for returning to supine  position. Assist for repositioning and comfort. He missed 30 minutes of therapy due to fatigue. NT made aware of position.  Therapy Documentation Precautions:  Precautions Precautions: Fall, Other (comment) (DNR) Precaution/Restrictions Comments: Urinary incontinence, DNR Required Braces or Orthoses: Splint/Cast Splint/Cast: R wrist cock up splint Restrictions Weight Bearing Restrictions Per Provider Order: No Other Position/Activity Restrictions: as tolerates General:    Therapy/Group: Individual Therapy  Pheobe Brass 09/14/2023, 7:42 AM

## 2023-09-14 NOTE — Progress Notes (Signed)
   09/14/23 0020  BiPAP/CPAP/SIPAP  $ Non-Invasive Home Ventilator  Subsequent  BiPAP/CPAP/SIPAP Pt Type Adult  BiPAP/CPAP/SIPAP Resmed  Mask Type Full face mask  Mask Size Medium  IPAP 8 cmH20  EPAP 4 cmH2O  Pressure Support 4 cmH20  FiO2 (%) 21 %  Patient Home Machine Yes  Safety Check Completed by RT for Home Unit Yes, no issues noted  Patient Home Mask Yes  Patient Home Tubing Yes  Auto Titrate Yes  Device Plugged into RED Power Outlet Yes  BiPAP/CPAP /SiPAP Vitals  Pulse Rate 80  Resp 18  SpO2 96 %  Bilateral Breath Sounds Clear;Diminished  MEWS Score/Color  MEWS Score 0  MEWS Score Color Marrie Sizer

## 2023-09-14 NOTE — Plan of Care (Signed)
  Problem: Consults Goal: RH STROKE PATIENT EDUCATION Description: See Patient Education module for education specifics  Outcome: Progressing   Problem: RH BOWEL ELIMINATION Goal: RH STG MANAGE BOWEL WITH ASSISTANCE Description: STG Manage Bowel with supervision Assistance. Outcome: Progressing   Problem: RH BOWEL ELIMINATION Goal: RH STG MANAGE BOWEL W/MEDICATION W/ASSISTANCE Description: STG Manage Bowel with Medication with Assistance. Outcome: Progressing   Problem: RH BLADDER ELIMINATION Goal: RH STG MANAGE BLADDER WITH ASSISTANCE Description: STG Manage Bladder With supervision Assistance Outcome: Progressing   Problem: RH SKIN INTEGRITY Goal: RH STG SKIN FREE OF INFECTION/BREAKDOWN Description: Manage skin free of infection/breakdown with supervision assistance Outcome: Progressing   Problem: RH KNOWLEDGE DEFICIT Goal: RH STG INCREASE KNOWLEDGE OF HYPERTENSION Description: Manage knowledge of hypertension with supervision assistance from daughter using educational materials provided Outcome: Progressing   Problem: RH KNOWLEDGE DEFICIT Goal: RH STG INCREASE KNOWLEDGE OF DIABETES Description: Manage knowledge of diabetes with supervision assistance from daughter using educational materials provided Outcome: Progressing

## 2023-09-14 NOTE — Progress Notes (Signed)
 PROGRESS NOTE   Subjective/Complaints: SLP session observed and secretions have improved Dizziness has improved as well Having type 7 stool  ROS: +right wrist pain, +dizziness--ongoing Denies constipation, HA, SOB, CP, +RUE edema, +pain from foley, +drooling  Objective:   No results found.   Recent Labs    09/12/23 0459 09/13/23 0640  WBC 6.4 5.7  HGB 11.1* 11.3*  HCT 34.1* 34.9*  PLT 330 325   Recent Labs    09/11/23 1407 09/12/23 0459  NA 137  --   K 4.4  --   CL 100  --   CO2 23  --   GLUCOSE 272*  --   BUN 45*  --   CREATININE 2.17* 2.00*  CALCIUM  9.7  --     Intake/Output Summary (Last 24 hours) at 09/14/2023 1033 Last data filed at 09/14/2023 0800 Gross per 24 hour  Intake 600 ml  Output 1800 ml  Net -1200 ml        Physical Exam: Vital Signs Blood pressure (!) 163/100, pulse 74, temperature 97.8 F (36.6 C), temperature source Axillary, resp. rate 18, height 5\' 7"  (1.702 m), weight 91.4 kg, SpO2 95%.  Gen: no distress, normal appearing, sitting in chair, appears comfortable  HEENT: oral mucosa pink and moist, NCAT.  Severe nystagmus and bilateral lateral gaze. Cardio: Reg rate Chest: CTAB, normal effort, normal rate of breathing Abd: soft, non-distended, NT Ext: R arm swelling- continues GU: clear yellow urine in bag Psych: pleasant, normal affect Skin: intact; mild right upper extremity swelling/edema  Neuro: dysarthia present--approximately 50% intelligible., improved comprehension today and ability to verbalize wants, stable 5/22   No tone or spasticity noted.  Cranial nerves grossly intact.   MSK: R arm in splint  GU: penile swelling     Assessment/Plan: 1. Functional deficits which require 3+ hours per day of interdisciplinary therapy in a comprehensive inpatient rehab setting. Physiatrist is providing close team supervision and 24 hour management of active medical  problems listed below. Physiatrist and rehab team continue to assess barriers to discharge/monitor patient progress toward functional and medical goals  Care Tool:  Bathing    Body parts bathed by patient: Right arm, Chest, Abdomen, Face   Body parts bathed by helper: Left arm, Front perineal area, Buttocks, Left lower leg, Right lower leg, Left upper leg, Right upper leg     Bathing assist Assist Level: Maximal Assistance - Patient 24 - 49%     Upper Body Dressing/Undressing Upper body dressing   What is the patient wearing?: Pull over shirt    Upper body assist Assist Level: Maximal Assistance - Patient 25 - 49%    Lower Body Dressing/Undressing Lower body dressing      What is the patient wearing?: Skirt, Incontinence brief     Lower body assist Assist for lower body dressing: Total Assistance - Patient < 25%     Toileting Toileting Toileting Activity did not occur (Clothing management and hygiene only): N/A (no void or bm)  Toileting assist Assist for toileting: Total Assistance - Patient < 25%     Transfers Chair/bed transfer  Transfers assist     Chair/bed transfer assist level: Maximal Assistance -  Patient 25 - 49%     Locomotion Ambulation   Ambulation assist      Assist level: Moderate Assistance - Patient 50 - 74% Assistive device: Hand held assist Max distance: 71ft   Walk 10 feet activity   Assist     Assist level: Moderate Assistance - Patient - 50 - 74% Assistive device: Hand held assist   Walk 50 feet activity   Assist    Assist level: Minimal Assistance - Patient > 75% Assistive device: Walker-Eva    Walk 150 feet activity   Assist    Assist level: Minimal Assistance - Patient > 75% Assistive device: Walker-Eva    Walk 10 feet on uneven surface  activity   Assist Walk 10 feet on uneven surfaces activity did not occur: Safety/medical concerns         Wheelchair     Assist Is the patient using a  wheelchair?: Yes Type of Wheelchair: Manual    Wheelchair assist level: Dependent - Patient 0%      Wheelchair 50 feet with 2 turns activity    Assist        Assist Level: Dependent - Patient 0%   Wheelchair 150 feet activity     Assist      Assist Level: Dependent - Patient 0%   Blood pressure (!) 163/100, pulse 74, temperature 97.8 F (36.6 C), temperature source Axillary, resp. rate 18, height 5\' 7"  (1.702 m), weight 91.4 kg, SpO2 95%.    Medical Problem List and Plan: 1. Functional deficits secondary to bilateral cerebellar infarct, right more than left secondary large vessel disease from severe posterior circulation stenosis             -patient may shower             -ELOS/Goals: 10-12,PT/OT sup to mod I             -Grounds pass ordered  -MRI repeat 5/17, was discussed with neurology Dr. Bonnita Buttner , not acute cva but some progression of prior strokes. No additional changes, avoid hypotension   -Consult SLP- worsened dysarthria over past few days  - 5-19: Multiple messages from therapist and nursing concerns regarding worsening dysarthria, ataxia, and right facial sensory loss.  No new focal deficits on exam, however symptoms did not improve with medication adjustments.  Stat CT head without acute findings.  No hemorrhagic transformation or mass effect.  2.  Antithrombotics: -DVT/anticoagulation:  Pharmaceutical: Lovenox              -antiplatelet therapy: Aspirin  81 mg daily and Plavix  75 mg day x 3 months then Plavix  alone 3. Pain Management: Neurontin  300 mg twice daily--well-controlled 4. Mood/Behavior/Sleep: Provide emotional support             -antipsychotic agents: N/A 5. Neuropsych/cognition: This patient is capable of making decisions on his own behalf.  -5/18 start lexapro  for mood, consider neuropsych consult  5/19: DC lexapro  d/t worsening lethargy, dysarthria  6. Skin/Wound Care: Routine skin checks  5-19: Right upper extremity duplex ordered prior  for swelling negative for DVT, however with multiple cystic structures concerning for abscess.  CT recommended for further assessment, order placed.  No current fevers or leukocytosis to indicate infection.  7. Fluids/Electrolytes/Nutrition: Routine in and outs with follow-up chemistries 8.  Klebsiella oxytoca UTI.  Completing course of Duricef  - Per urology note, is to continue on cefadroxil  1 tab p.o. nightly for CAUTI prophylaxis while Foley is in place  9.  CKD  stage IV.  Discussed that creatinine is improving  -Cr overalls stable 2.47/BUN 50- monitor  5-19: BUN/creatinine stable. 10.  Chronic combined systolic and diastolic congestive heart failure. EF 25-30% Monitor for any signs of fluid overload.  Follow-up cardiology services.  Continue Demadex  60 mg twice daily, weight reviewed and is increased, daily weights ordered, weights are stable/increased, continue current regimen  5/17 wt trending up although suspect may be inaccurate, no signs of overload noted  5/18 Wt appears stable, monitor  5/19: weights downtrending, ? Stroke recrudescence with normotension d/t stenosis, reduce torsemide  to 60 mg daily  5/21: Cr reviewed and is improving  Filed Weights   09/11/23 0500 09/12/23 0520 09/14/23 0500  Weight: 93.7 kg 92.6 kg 91.4 kg     11.  Nonischemic cardiomyopathy.  Followed by cardiology service Dr. Micael Adas.  Continue Imdur  30 mg daily and Coreg  6.25 mg twice daily.  See #10.  12.  History of prostate cancer, BPH.  Followed by Dr. Freddi Jaeger.  Flomax  0.4 mg twice daily             -Check PVR/bladder scan  5/17-8 he has foley in placed, clear yellow urine  Discussed trial of void but patient prefers foley at this time  13.  Diabetes mellitus with peripheral neuropathy and hemoglobin A1c 7.0.  Glucotrol  5 mg daily  -5/18 few elevated CBGs, continue to monitor trend for now  5-19: BG is generally well-controlled, monitor  CBG (last 3)  Recent Labs    09/13/23 1657 09/13/23 2132  09/14/23 0614  GLUCAP 173* 184* 109*     14. HTN. Long term goal normotensive. Home meds carvedilol , imdur , toresemide  -5/18 would like avoid hypotension, decrease Imdur  to 15 mg, decrease Coreg  to 3.125 twice daily, decrease torsemide  to daily  5/19: Weight down, BP normotensive but may be underperfusing; reduce torsemide  as above. Can consider reducing flomax  as well pending DC foley trial but cannot stay off long term d/t chronic retention.  Vitals:   09/14/23 0300 09/14/23 0833  BP: 133/82 (!) 163/100  Pulse: 66 74  Resp: 18   Temp: 97.8 F (36.6 C)   SpO2: 95%       15. Obesity. Body mass index is 33.67 kg/m.             -dietary education  16. R wrist pain due to fall. Wearing wrist brace. Xray without fracture. Voltaren  gel scheduled             -tylenol  prn  -5/17 Will check vas US  due to R arm swelling  5-19: See above; no DVT, possible abscesses/cystic structures.    5/20: CT reviewed and shows age indeterminate fracture, will consult ortho  17. Constipation: D/c miralax , d/c [place], encouraged prune juice, asked nursing to bring prune juice for him  Last bowel movement 5/21  18. Dizziness/vertigo/nystagmus: vestibular eval ordered   Decrease meclizine  to daily given lethargy.  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  Scopolamine patch added  19. Anemia: Hgb reviewed and is slightly improved  20. AMS: MRI brain and CT Head obtained without acute concerning findings, have consulted neurology to review, does appear better today  21. Drooling: scopolamine patch added, helping, continue  22. Lethargy: modafinil ordered, helping, continue  23. Penile swelling: placed order to d/c foley  24. Type 7 stool: metamucil ordered  25. HTN: may be 2/2 to pain from foley, will recheck after removal  LOS: 9 days A FACE TO FACE EVALUATION WAS PERFORMED  Lavell Portugal  P Saanvika Vazques 09/14/2023, 10:33 AM

## 2023-09-14 NOTE — Progress Notes (Signed)
 Occupational Therapy Session Note  Patient Details  Name: Allen Dyer MRN: [id number] Date of Birth: February 24, 1936  Today's Date: 09/14/2023 OT Individual Time: 8295-6213 OT Individual Time Calculation (min): 75 min    Short Term Goals: Week 2:  OT Short Term Goal 1 (Week 2): Patient will assist with lower body dressing in bed by bridging/ half bridging OT Short Term Goal 2 (Week 2): Patient will safely sit at edge of bed with min assist when preparing for a trasnfer to wheelchair/recliner OT Short Term Goal 3 (Week 2): Patient will safely statically sit x 3 min in preparation for toileting activities OT Short Term Goal 4 (Week 2): Patient will complete hygiene at sink with set up assist OT Short Term Goal 5 (Week 2): Patient will feed himself and drink from a cup with intermittent min assist  Skilled Therapeutic Interventions/Progress Updates:   Patient received seated in wheelchair wrapped in blankets and reporting feeling cold.  Reports right side is very cold.  Patient agreeable to hot shower.  Transported into bathroom, and patient could stand pivot with max assist.  Ataxia limiting performance.  Patient with limited ability to weight shift and move feet effectively.  Patient with ataxic sitting which quieted but did not cease while showering.  Patient may have done better if he leaned back to seat back to reduce ataxia.  Patient repeatedly said shower felt good.   Patient assisted back to chair at end of session, with max assist and use of grab bar.  Patient able to quiet truncal ataxia when seated with back against the back of his chair.  Assisted patient to dress - all goals relating to self care downgraded.   Worked on simple head and eye movements while seated.  Patient with increased horizontal nystagmus with eye turns to left.  Worked on slow movement of eyes toward left ~ 30*.  Patient avoids fast eye movement to left due to symptoms.  Patient left up in wheelchair at end of session  with safety belt in place and engaged and call bell in lap.    Therapy Documentation Precautions:  Precautions Precautions: Fall, Other (comment) (DNR) Precaution/Restrictions Comments: Urinary incontinence, DNR Required Braces or Orthoses: Splint/Cast Splint/Cast: R wrist cock up splint Restrictions Weight Bearing Restrictions Per Provider Order: No Other Position/Activity Restrictions: as tolerates   Pain: Pain Assessment Pain Scale: Faces Faces Pain Scale: No hurt      Therapy/Group: Individual Therapy  [name] M 09/14/2023, 12:46 PM

## 2023-09-14 NOTE — Progress Notes (Signed)
 Nurse repositioned cpap x2 during the night, took off once to given pt water per request. Pt had an incontinent  liquid stool episode at 3am. Tolerated well.

## 2023-09-14 NOTE — Plan of Care (Signed)
  Problem: RH BOWEL ELIMINATION Goal: RH STG MANAGE BOWEL WITH ASSISTANCE Description: STG Manage Bowel with supervision Assistance. Outcome: Progressing Goal: RH STG MANAGE BOWEL W/MEDICATION W/ASSISTANCE Description: STG Manage Bowel with Medication with Assistance. Outcome: Progressing   Problem: RH BLADDER ELIMINATION Goal: RH STG MANAGE BLADDER WITH ASSISTANCE Description: STG Manage Bladder With supervision Assistance Outcome: Progressing   Problem: RH SKIN INTEGRITY Goal: RH STG SKIN FREE OF INFECTION/BREAKDOWN Description: Manage skin free of infection/breakdown with supervision assistance Outcome: Progressing   Problem: RH COGNITION-NURSING Goal: RH STG USES MEMORY AIDS/STRATEGIES W/ASSIST TO PROBLEM SOLVE Description: STG Uses Memory Aids/Strategies With supervision Assistance to Problem Solve. Outcome: Progressing Flowsheets (Taken 09/14/2023 1644) STG: Uses memory aids/strategies with assistance: 4-Minimal assistance   Problem: RH PAIN MANAGEMENT Goal: RH STG PAIN MANAGED AT OR BELOW PT'S PAIN GOAL Description: <4 w/ prns Outcome: Progressing

## 2023-09-14 NOTE — Progress Notes (Signed)
 Speech Language Pathology Daily Session Note  Patient Details  Name: Allen Dyer MRN: [id number] Date of Birth: [birth year]  Today's Date: 09/14/2023 SLP Individual Time: 0807-0903 SLP Individual Time Calculation (min): 56 min  Short Term Goals: Week 1: SLP Short Term Goal 1 (Week 1): Patient will complete MBS to determine least restrictive safe diet. SLP Short Term Goal 2 (Week 1): Patient will orient to date utilizing external aids when provided with mod verbal cues. SLP Short Term Goal 3 (Week 1): Patient will selectively attend to speech therapy tasks for 10 minute intervals given mod assist. SLP Short Term Goal 4 (Week 1): Patient will recall pertinent daily information with 80% accuracy utilizing external/internal aids when given mod assist. SLP Short Term Goal 5 (Week 1): Patient will solve basic environmental problems with 80% accuracy given mod assist. SLP Short Term Goal 6 (Week 1): Patient will utilize speech intelligibility and communication repair strategies in 8/10 opportunities at the phrase level given mod assist.  Skilled Therapeutic Interventions: SLP conducted skilled therapy session targeting dysphagia and communication goals. Upon entry, patient sipping thin liquids from 10cc Provale cup. Patient endorses that he tolerated breakfast items without difficulty. Throughout session, no overt s/sx of penetration/aspiration observed with thin liquids. LPN entered during session to administer medications whole in puree. Patient tolerated without difficulty. Recommend continuation of Dys3/thin liquid diet with liquids administered via 10cc provale cup, medications administered whole in puree. SLP facilitated completion of 5 sets of 5 EMST repetitions at ~20cmH2O reduced to 15 cmH2O due to reduced capacity to maintain adequate lip seal around the device. SLP provided manual assistance to keep right side of the lips closed around the embouchure. Patient benefited from min-mod cues to  utilize abdominal accessory respiratory muscle engagement. To target speech intelligibility, SLP provided education and handout re: SLOP speech intelligibility strategies. After initial education, facilitated word-level task with patient incorporating "loud" and "overarticulate" strategies given min to mod assist. Patient was left in room with call bell in reach and alarm set. SLP will continue to target goals per plan of care.        Pain Pain Assessment Pain Scale: Faces Faces Pain Scale: No hurt  Therapy/Group: Individual Therapy  Allen Dyer, M.A., CCC-SLP  Allen Dyer 09/14/2023, 9:03 AM

## 2023-09-15 LAB — GLUCOSE, CAPILLARY
Glucose-Capillary: 111 mg/dL — ABNORMAL HIGH (ref 70–99)
Glucose-Capillary: 200 mg/dL — ABNORMAL HIGH (ref 70–99)
Glucose-Capillary: 141 mg/dL — ABNORMAL HIGH (ref 70–99)
Glucose-Capillary: 148 mg/dL — ABNORMAL HIGH (ref 70–99)

## 2023-09-15 NOTE — Progress Notes (Signed)
 PROGRESS NOTE   Subjective/Complaints: MEWS 0 Somnolent Patient's chart reviewed- No issues reported overnight Vitals signs stable   ROS: +right wrist pain, +dizziness--ongoing Denies constipation, HA, SOB, CP, +RUE edema, +pain from foley, +drooling  Objective:   No results found.   Recent Labs    09/13/23 0640  WBC 5.7  HGB 11.3*  HCT 34.9*  PLT 325   No results for input(s): "NA", "K", "CL", "CO2", "GLUCOSE", "BUN", "CREATININE", "CALCIUM " in the last 72 hours.   Intake/Output Summary (Last 24 hours) at 09/15/2023 1041 Last data filed at 09/15/2023 9147 Gross per 24 hour  Intake 838 ml  Output 700 ml  Net 138 ml        Physical Exam: Vital Signs Blood pressure 139/81, pulse (!) 57, temperature 98.1 F (36.7 C), resp. rate 18, height 5\' 7"  (1.702 m), weight 91.4 kg, SpO2 97%.  Gen: no distress, normal appearing, sitting in chair, appears comfortable  HEENT: oral mucosa pink and moist, NCAT.  Severe nystagmus and bilateral lateral gaze. Cardio: Bradycardic Chest: CTAB, normal effort, normal rate of breathing Abd: soft, non-distended, NT Ext: R arm swelling- continues GU: clear yellow urine in bag Psych: pleasant, normal affect Skin: intact; mild right upper extremity swelling/edema  Neuro: dysarthia present--approximately 50% intelligible., improved comprehension today and ability to verbalize wants, stable 5/22   No tone or spasticity noted.  Cranial nerves grossly intact.   MSK: R arm in splint  GU: penile swelling     Assessment/Plan: 1. Functional deficits which require 3+ hours per day of interdisciplinary therapy in a comprehensive inpatient rehab setting. Physiatrist is providing close team supervision and 24 hour management of active medical problems listed below. Physiatrist and rehab team continue to assess barriers to discharge/monitor patient progress toward functional and medical  goals  Care Tool:  Bathing    Body parts bathed by patient: Right arm, Chest, Abdomen, Face   Body parts bathed by helper: Right arm, Left arm, Chest, Abdomen, Front perineal area, Buttocks, Right upper leg, Left upper leg, Right lower leg, Left lower leg, Face (shower)     Bathing assist Assist Level: Total Assistance - Patient < 25%     Upper Body Dressing/Undressing Upper body dressing   What is the patient wearing?: Pull over shirt    Upper body assist Assist Level: Maximal Assistance - Patient 25 - 49%    Lower Body Dressing/Undressing Lower body dressing      What is the patient wearing?: Pants, Incontinence brief     Lower body assist Assist for lower body dressing: Total Assistance - Patient < 25%     Toileting Toileting Toileting Activity did not occur (Clothing management and hygiene only): N/A (no void or bm)  Toileting assist Assist for toileting: Total Assistance - Patient < 25%     Transfers Chair/bed transfer  Transfers assist     Chair/bed transfer assist level: Total Assistance - Patient < 25%     Locomotion Ambulation   Ambulation assist   Ambulation activity did not occur: Safety/medical concerns  Assist level: Moderate Assistance - Patient 50 - 74% Assistive device: Hand held assist Max distance: 44ft   Walk 10 feet  activity   Assist  Walk 10 feet activity did not occur: Safety/medical concerns  Assist level: Moderate Assistance - Patient - 50 - 74% Assistive device: Hand held assist   Walk 50 feet activity   Assist Walk 50 feet with 2 turns activity did not occur: Safety/medical concerns  Assist level: Minimal Assistance - Patient > 75% Assistive device: Walker-Eva    Walk 150 feet activity   Assist Walk 150 feet activity did not occur: Safety/medical concerns  Assist level: Minimal Assistance - Patient > 75% Assistive device: Walker-Eva    Walk 10 feet on uneven surface  activity   Assist Walk 10 feet on  uneven surfaces activity did not occur: Safety/medical concerns         Wheelchair     Assist Is the patient using a wheelchair?: Yes Type of Wheelchair: Manual    Wheelchair assist level: Dependent - Patient 0%      Wheelchair 50 feet with 2 turns activity    Assist        Assist Level: Dependent - Patient 0%   Wheelchair 150 feet activity     Assist      Assist Level: Dependent - Patient 0%   Blood pressure 139/81, pulse (!) 57, temperature 98.1 F (36.7 C), resp. rate 18, height 5\' 7"  (1.702 m), weight 91.4 kg, SpO2 97%.    Medical Problem List and Plan: 1. Functional deficits secondary to bilateral cerebellar infarct, right more than left secondary large vessel disease from severe posterior circulation stenosis             -patient may shower             -ELOS/Goals: 10-12,PT/OT sup to mod I             -Grounds pass ordered  -MRI repeat 5/17, was discussed with neurology Dr. Bonnita Buttner , not acute cva but some progression of prior strokes. No additional changes, avoid hypotension   -Consult SLP- worsened dysarthria over past few days  - 5-19: Multiple messages from therapist and nursing concerns regarding worsening dysarthria, ataxia, and right facial sensory loss.  No new focal deficits on exam, however symptoms did not improve with medication adjustments.  Stat CT head without acute findings.  No hemorrhagic transformation or mass effect.  2.  Antithrombotics: -DVT/anticoagulation:  Pharmaceutical: Lovenox              -antiplatelet therapy: Aspirin  81 mg daily and Plavix  75 mg day x 3 months then Plavix  alone 3. Pain Management: Neurontin  300 mg twice daily--well-controlled 4. Mood/Behavior/Sleep: Provide emotional support             -antipsychotic agents: N/A 5. Neuropsych/cognition: This patient is capable of making decisions on his own behalf.  -5/18 start lexapro  for mood, consider neuropsych consult  5/19: DC lexapro  d/t worsening lethargy,  dysarthria  6. Skin/Wound Care: Routine skin checks  5-19: Right upper extremity duplex ordered prior for swelling negative for DVT, however with multiple cystic structures concerning for abscess.  CT recommended for further assessment, order placed.  No current fevers or leukocytosis to indicate infection.  7. Fluids/Electrolytes/Nutrition: Routine in and outs with follow-up chemistries 8.  Klebsiella oxytoca UTI.  Completing course of Duricef  - Per urology note, is to continue on cefadroxil  1 tab p.o. nightly for CAUTI prophylaxis while Foley is in place  9.  CKD stage IV.  Discussed that creatinine is improving  -Cr overalls stable 2.47/BUN 50- monitor  5-19: BUN/creatinine stable. 10.  Chronic combined systolic and diastolic congestive heart failure. EF 25-30% Monitor for any signs of fluid overload.  Follow-up cardiology services.  Continue Demadex  60 mg twice daily, weight reviewed and is increased, daily weights ordered, weights are stable/increased, continue current regimen  5/17 wt trending up although suspect may be inaccurate, no signs of overload noted  5/18 Wt appears stable, monitor  5/19: weights downtrending, ? Stroke recrudescence with normotension d/t stenosis, reduce torsemide  to 60 mg daily  5/21: Cr reviewed and is improving  Filed Weights   09/11/23 0500 09/12/23 0520 09/14/23 0500  Weight: 93.7 kg 92.6 kg 91.4 kg     11.  Nonischemic cardiomyopathy.  Followed by cardiology service Dr. Micael Adas.  Continue Imdur  30 mg daily and Coreg  6.25 mg twice daily.  See #10.  12.  History of prostate cancer, BPH.  Followed by Dr. Freddi Jaeger.  Flomax  0.4 mg twice daily             -Check PVR/bladder scan  5/17-8 he has foley in placed, clear yellow urine  Discussed trial of void but patient prefers foley at this time  13.  Diabetes mellitus with peripheral neuropathy and hemoglobin A1c 7.0.  Glucotrol  5 mg daily  -5/18 few elevated CBGs, continue to monitor trend for now  5-19: BG  is generally well-controlled, monitor  CBG (last 3)  Recent Labs    09/14/23 1635 09/14/23 2107 09/15/23 0548  GLUCAP 117* 138* 111*     14. HTN. Long term goal normotensive. Home meds carvedilol , imdur , toresemide  -5/18 would like avoid hypotension, decrease Imdur  to 15 mg, decrease Coreg  to 3.125 twice daily, decrease torsemide  to daily  5/19: Weight down, BP normotensive but may be underperfusing; reduce torsemide  as above. Can consider reducing flomax  as well pending DC foley trial but cannot stay off long term d/t chronic retention.  Vitals:   09/15/23 0040 09/15/23 0431  BP:  139/81  Pulse: 78 (!) 57  Resp: 18 18  Temp:  98.1 F (36.7 C)  SpO2: 97% 97%      15. Obesity. Body mass index is 33.67 kg/m.             -dietary education  16. R wrist pain due to fall. Wearing wrist brace. Xray without fracture. Voltaren  gel scheduled             -tylenol  prn  -5/17 Will check vas US  due to R arm swelling  5-19: See above; no DVT, possible abscesses/cystic structures.    5/20: CT reviewed and shows age indeterminate fracture, will consult ortho  17. Constipation: D/c miralax , d/c [place], encouraged prune juice, asked nursing to bring prune juice for him  Last bowel movement 5/21  18. Dizziness/vertigo/nystagmus: vestibular eval ordered   Decrease meclizine  to daily given lethargy.  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  Scopolamine patch added  19. Anemia: Hgb reviewed and is slightly improved  20. AMS: MRI brain and CT Head obtained without acute concerning findings, have consulted neurology to review, does appear better today  21. Drooling: scopolamine patch added, helping, continue  22. Lethargy: modafinil ordered, helping, continue  23. Penile swelling: placed order to d/c foley, will message nurse regarding status  24. Type 7 stool: last BM 5/23, continue metamucil  25. HTN: resolved, continue to monitor BP TID  LOS: 10 days A FACE TO  FACE EVALUATION WAS PERFORMED  Allen Dyer 09/15/2023, 10:41 AM

## 2023-09-15 NOTE — Progress Notes (Signed)
 RRT contacted and night Nurse assigned to patient made aware and informed. Then family member also confirmed issues with patients' CPAP machine. Per family and report from the last two nights patient having difficulty with the CPAP machine and difficult with the mask fitting. Also, family member expressing concern the current pressure setting is too low. Discussed cardiovascular issues with sleep apnea.

## 2023-09-15 NOTE — Progress Notes (Signed)
 Granddaughter of patient prior to leaving today expressed concern about her Grandfather. States "he use to be able to smile when he saw me this week, but unable to last few days." Reassessing patient able to smile, hold for approximately five seconds. Facial droop is less to the left this afternoon. Motor strength remains the same no changes. Pupils are pinpoint. Right remains non-reactive and left is reactive. Alert and able to respond to questions. No symptoms reported by patient. Is having increase sensation with regards to the need to void today. Granddaughter reports patient able to express pain with arm when accidentally sitting on arm while in bed. Was observed twice this afternoon prior to going the bathroom while standing patient began to have tremors with lower extremities. Able to talk to patient and encourage use of deep breathing to aide him in relaxing and stop the tremors.

## 2023-09-15 NOTE — Progress Notes (Signed)
   09/15/23 0040  BiPAP/CPAP/SIPAP  $ Non-Invasive Home Ventilator  Subsequent  BiPAP/CPAP/SIPAP Pt Type Adult  BiPAP/CPAP/SIPAP Resmed  Mask Type Full face mask  Mask Size Medium  IPAP 8 cmH20  EPAP 4 cmH2O  FiO2 (%) 21 %  Patient Home Machine Yes  Safety Check Completed by RT for Home Unit Yes, no issues noted  Patient Home Mask Yes  Patient Home Tubing Yes  Auto Titrate Yes  Device Plugged into RED Power Outlet Yes  BiPAP/CPAP /SiPAP Vitals  Pulse Rate 78  Resp 18  SpO2 97 %  Bilateral Breath Sounds Clear;Diminished

## 2023-09-15 NOTE — Plan of Care (Signed)
  Problem: RH BOWEL ELIMINATION Goal: RH STG MANAGE BOWEL WITH ASSISTANCE Description: STG Manage Bowel with supervision Assistance. Outcome: Progressing Goal: RH STG MANAGE BOWEL W/MEDICATION W/ASSISTANCE Description: STG Manage Bowel with Medication with Assistance. Outcome: Progressing   Problem: RH BLADDER ELIMINATION Goal: RH STG MANAGE BLADDER WITH ASSISTANCE Description: STG Manage Bladder With supervision Assistance Outcome: Progressing   Problem: RH SAFETY Goal: RH STG ADHERE TO SAFETY PRECAUTIONS W/ASSISTANCE/DEVICE Description: STG Adhere to Safety Precautions With supervision  Assistance/Device. Outcome: Progressing   Problem: RH PAIN MANAGEMENT Goal: RH STG PAIN MANAGED AT OR BELOW PT'S PAIN GOAL Description: <4 w/ prns Outcome: Progressing

## 2023-09-16 LAB — GLUCOSE, CAPILLARY
Glucose-Capillary: 108 mg/dL — ABNORMAL HIGH (ref 70–99)
Glucose-Capillary: 170 mg/dL — ABNORMAL HIGH (ref 70–99)
Glucose-Capillary: 155 mg/dL — ABNORMAL HIGH (ref 70–99)
Glucose-Capillary: 191 mg/dL — ABNORMAL HIGH (ref 70–99)

## 2023-09-16 MED ORDER — VITAMIN D 25 MCG (1000 UNIT) PO TABS
2000.0000 [IU] | ORAL_TABLET | Freq: Every morning | ORAL | Status: DC
  Administered 2023-09-17 – 2023-09-27 (×11): 2000 [IU] via ORAL
  Filled 2023-09-16 (×11): qty 2

## 2023-09-16 NOTE — Progress Notes (Signed)
 Physical Therapy Session Note  Patient Details  Name: Allen Dyer MRN: [id number] Date of Birth: 02-13-1936  Today's Date: 09/16/2023 PT Individual Time: 4098-1191 PT Individual Time Calculation (min): 50 min   Short Term Goals: Week 2:  PT Short Term Goal 1 (Week 2): Pt will complete bed<>chair transfers with maxA PT Short Term Goal 2 (Week 2): Pt will propel wheelchair ~42ft with modA  Skilled Therapeutic Interventions/Progress Updates:  Pt received sitting in recliner chair. Pt agreeable to PT tx and reports no complaints of pain.  Seated therex: quad set against recliner leg rest x 20; SLR off of recliner leg rest x 20; seated LAQ x 20. Pt required extensive time due to lack of quality of movement with focus on slow and controlled movement.  NMR: Seated ataxia treatment. PT applied pressure through pt's bil upper traps when seated edge of recliner to calm ataxia with 1 min hold. Performed x several bouts. PT would then release pressure and have pt hold steady state with pt able to hold up to 5 sec progressing to 32 sec out of 1 min. PT then had patient rest back against back rest and then come to sitting in neutral to work more core and target ataxia with PT applying same firm pressure bil Upper Traps to calm neurological system. This was performed x 8 bouts but then pt became able to control truncal ataxia upon return from reclining to neutral sitting. He was able to maintain stable non-ataxic neutral spine positioning up to 32 sec. Family walked in on last bouts of last exercise astonished that he was able to sit without back support and hold himself still, even with scanning his environment. PT discussed with them course of NMR treatment this visit and PT usage of the terms "silence your body" and "sit very still" and his ability to perform after compression was applied and then on his own.  Pt was left with family; no complaints of pain; nursing notified of BM while PT was talking with  family.      Therapy Documentation Precautions:  Precautions Precautions: Fall, Other (comment) (DNR) Precaution/Restrictions Comments: Urinary incontinence, DNR Required Braces or Orthoses: Splint/Cast Splint/Cast: R wrist cock up splint Restrictions Weight Bearing Restrictions Per Provider Order: No Other Position/Activity Restrictions: as tolerates   Therapy/Group: Individual Therapy  Jeannene Milling 09/16/2023, 7:44 AM

## 2023-09-16 NOTE — Progress Notes (Signed)
 PROGRESS NOTE   Subjective/Complaints: No new complaints this morning Using the provale cup to drink water independently Feeling well  ROS: +right wrist pain, +dizziness--ongoing Denies constipation, HA, SOB, CP, +RUE edema, +pain from foley, +drooling- improved  Objective:   No results found.   No results for input(s): "WBC", "HGB", "HCT", "PLT" in the last 72 hours.  No results for input(s): "NA", "K", "CL", "CO2", "GLUCOSE", "BUN", "CREATININE", "CALCIUM " in the last 72 hours.   Intake/Output Summary (Last 24 hours) at 09/16/2023 1059 Last data filed at 09/16/2023 0753 Gross per 24 hour  Intake 600 ml  Output --  Net 600 ml        Physical Exam: Vital Signs Blood pressure (!) 140/58, pulse 62, temperature (!) 97.5 F (36.4 C), temperature source Oral, resp. rate 18, height 5\' 7"  (1.702 m), weight 90.9 kg, SpO2 92%.  Gen: no distress, normal appearing, sitting in chair, appears comfortable  HEENT: oral mucosa pink and moist, NCAT.  Severe nystagmus and bilateral lateral gaze. Cardio: Bradycardic Chest: CTAB, normal effort, normal rate of breathing Abd: soft, non-distended, NT Ext: R arm swelling- continues GU: clear yellow urine in bag Psych: pleasant, normal affect Skin: intact; mild right upper extremity swelling/edema  Neuro: dysarthia present--approximately 50% intelligible., improved comprehension today and ability to verbalize wants, stable 5/22   No tone or spasticity noted.  Cranial nerves grossly intact.   MSK: R arm in splint  GU: penile swelling- improved     Assessment/Plan: 1. Functional deficits which require 3+ hours per day of interdisciplinary therapy in a comprehensive inpatient rehab setting. Physiatrist is providing close team supervision and 24 hour management of active medical problems listed below. Physiatrist and rehab team continue to assess barriers to discharge/monitor  patient progress toward functional and medical goals  Care Tool:  Bathing    Body parts bathed by patient: Right arm, Chest, Abdomen, Face   Body parts bathed by helper: Right arm, Left arm, Chest, Abdomen, Front perineal area, Buttocks, Right upper leg, Left upper leg, Right lower leg, Left lower leg, Face (shower)     Bathing assist Assist Level: Total Assistance - Patient < 25%     Upper Body Dressing/Undressing Upper body dressing   What is the patient wearing?: Pull over shirt    Upper body assist Assist Level: Maximal Assistance - Patient 25 - 49%    Lower Body Dressing/Undressing Lower body dressing      What is the patient wearing?: Pants, Incontinence brief     Lower body assist Assist for lower body dressing: Total Assistance - Patient < 25%     Toileting Toileting Toileting Activity did not occur (Clothing management and hygiene only): N/A (no void or bm)  Toileting assist Assist for toileting: Total Assistance - Patient < 25%     Transfers Chair/bed transfer  Transfers assist     Chair/bed transfer assist level: Total Assistance - Patient < 25%     Locomotion Ambulation   Ambulation assist   Ambulation activity did not occur: Safety/medical concerns  Assist level: Moderate Assistance - Patient 50 - 74% Assistive device: Hand held assist Max distance: 7ft   Walk 10 feet activity  Assist  Walk 10 feet activity did not occur: Safety/medical concerns  Assist level: Moderate Assistance - Patient - 50 - 74% Assistive device: Hand held assist   Walk 50 feet activity   Assist Walk 50 feet with 2 turns activity did not occur: Safety/medical concerns  Assist level: Minimal Assistance - Patient > 75% Assistive device: Walker-Eva    Walk 150 feet activity   Assist Walk 150 feet activity did not occur: Safety/medical concerns  Assist level: Minimal Assistance - Patient > 75% Assistive device: Walker-Eva    Walk 10 feet on uneven  surface  activity   Assist Walk 10 feet on uneven surfaces activity did not occur: Safety/medical concerns         Wheelchair     Assist Is the patient using a wheelchair?: Yes Type of Wheelchair: Manual    Wheelchair assist level: Dependent - Patient 0%      Wheelchair 50 feet with 2 turns activity    Assist        Assist Level: Dependent - Patient 0%   Wheelchair 150 feet activity     Assist      Assist Level: Dependent - Patient 0%   Blood pressure (!) 140/58, pulse 62, temperature (!) 97.5 F (36.4 C), temperature source Oral, resp. rate 18, height 5\' 7"  (1.702 m), weight 90.9 kg, SpO2 92%.    Medical Problem List and Plan: 1. Functional deficits secondary to bilateral cerebellar infarct, right more than left secondary large vessel disease from severe posterior circulation stenosis             -patient may shower             -ELOS/Goals: 10-12,PT/OT sup to mod I             -Grounds pass ordered  -MRI repeat 5/17, was discussed with neurology Dr. Bonnita Buttner , not acute cva but some progression of prior strokes. No additional changes, avoid hypotension   -Consult SLP- worsened dysarthria over past few days  - 5-19: Multiple messages from therapist and nursing concerns regarding worsening dysarthria, ataxia, and right facial sensory loss.  No new focal deficits on exam, however symptoms did not improve with medication adjustments.  Stat CT head without acute findings.  No hemorrhagic transformation or mass effect.  2.  Antithrombotics: -DVT/anticoagulation:  Pharmaceutical: Lovenox              -antiplatelet therapy: Aspirin  81 mg daily and Plavix  75 mg day x 3 months then Plavix  alone 3. Pain Management: Neurontin  300 mg twice daily--well-controlled 4. Mood/Behavior/Sleep: Provide emotional support             -antipsychotic agents: N/A 5. Neuropsych/cognition: This patient is capable of making decisions on his own behalf.  -5/18 start lexapro  for mood,  consider neuropsych consult  5/19: DC lexapro  d/t worsening lethargy, dysarthria  6. Skin/Wound Care: Routine skin checks  5-19: Right upper extremity duplex ordered prior for swelling negative for DVT, however with multiple cystic structures concerning for abscess.  CT recommended for further assessment, order placed.  No current fevers or leukocytosis to indicate infection.  7. Fluids/Electrolytes/Nutrition: Routine in and outs with follow-up chemistries 8.  Klebsiella oxytoca UTI.  Completing course of Duricef  - Per urology note, is to continue on cefadroxil  1 tab p.o. nightly for CAUTI prophylaxis while Foley is in place  9.  CKD stage IV.  Discussed that creatinine is improving  -Cr overalls stable 2.47/BUN 50- monitor  5-19: BUN/creatinine stable.  10.  Chronic combined systolic and diastolic congestive heart failure. EF 25-30% Monitor for any signs of fluid overload.  Follow-up cardiology services.  Continue Demadex  60 mg twice daily, weight reviewed and is increased, daily weights ordered, weights are stable/increased, continue current regimen  5/17 wt trending up although suspect may be inaccurate, no signs of overload noted  5/18 Wt appears stable, monitor  5/19: weights downtrending, ? Stroke recrudescence with normotension d/t stenosis, reduce torsemide  to 60 mg daily  5/21: Cr reviewed and is improving  Filed Weights   09/12/23 0520 09/14/23 0500 09/16/23 0515  Weight: 92.6 kg 91.4 kg 90.9 kg     11.  Nonischemic cardiomyopathy.  Followed by cardiology service Dr. Micael Adas.  Continue Imdur  30 mg daily and Coreg  6.25 mg twice daily.  See #10.  12.  History of prostate cancer, BPH.  Followed by Dr. Freddi Jaeger.  Flomax  0.4 mg twice daily             -Check PVR/bladder scan  5/17-8 he has foley in placed, clear yellow urine  Discussed trial of void but patient prefers foley at this time  13.  Diabetes mellitus with peripheral neuropathy and hemoglobin A1c 7.0.  Glucotrol  5 mg  daily  -5/18 few elevated CBGs, continue to monitor trend for now  5-19: BG is generally well-controlled, monitor  CBG (last 3)  Recent Labs    09/15/23 1709 09/15/23 2042 09/16/23 0548  GLUCAP 141* 148* 108*     14. HTN. Long term goal normotensive. Home meds carvedilol , imdur , toresemide  -5/18 would like avoid hypotension, decrease Imdur  to 15 mg, decrease Coreg  to 3.125 twice daily, decrease torsemide  to daily  5/19: Weight down, BP normotensive but may be underperfusing; reduce torsemide  as above. Can consider reducing flomax  as well pending DC foley trial but cannot stay off long term d/t chronic retention.  Vitals:   09/15/23 2137 09/16/23 0436  BP: 116/73 (!) 140/58  Pulse: 68 62  Resp: 18 18  Temp: 97.7 F (36.5 C) (!) 97.5 F (36.4 C)  SpO2: 97% 92%      15. Obesity. Body mass index is 33.67 kg/m.             -dietary education  16. R wrist pain due to fall. Wearing wrist brace. Xray without fracture. Voltaren  gel scheduled             -tylenol  prn  -5/17 Will check vas US  due to R arm swelling  5-19: See above; no DVT, possible abscesses/cystic structures.    5/20: CT reviewed and shows age indeterminate fracture, will consult ortho  17. Constipation: D/c miralax , d/c Sen[place]ot, encouraged prune juice, asked nursing to bring prune juice for him  Last bowel movement 5/21  18. Dizziness/vertigo/nystagmus: vestibular eval ordered   Decrease meclizine  to daily given lethargy.  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  Scopolamine patch added  19. Anemia: Hgb reviewed and is slightly improved  20. AMS: MRI brain and CT Head obtained without acute concerning findings, have consulted neurology to review, does appear better today  21. Drooling: scopolamine patch added, helping, continue  22. Lethargy: modafinil ordered, helping, continue  23. Penile swelling: placed order to d/c foley, will message nurse regarding status  24. Type 7 stool: last  BM 5/23, continue metamucil  25. Systolic hypertension: continue to monitor BP TID, continue coreg   26. Diastolic hypotension: continue to monitor BP TID  27. Screening for vitamin D  deficiency: check vitamin D  with labs tomorrow,  increase daily supplement to 2,000U  LOS: 11 days A FACE TO FACE EVALUATION WAS PERFORMED  Lavell Portugal P Amier Hoyt 09/16/2023, 10:59 AM

## 2023-09-16 NOTE — Progress Notes (Signed)
 Occupational Therapy Session Note  Patient Details  Name: Allen Dyer MRN: [id number] Date of Birth: [birth year]  Today's Date: 09/16/2023 OT Individual Time: 0105-0149 OT Individual Time Calculation (min): 44 min    Short Term Goals: Week 1:  OT Short Term Goal 1 (Week 1): Patient will transfer to shower with min assist OT Short Term Goal 1 - Progress (Week 1): Not met OT Short Term Goal 2 (Week 1): Patient will transfer to toilet with min assist OT Short Term Goal 2 - Progress (Week 1): Not met OT Short Term Goal 3 (Week 1): Patient will dress upper body with set up assist OT Short Term Goal 3 - Progress (Week 1): Not met OT Short Term Goal 4 (Week 1): Patient will dress lower body with mod assist OT Short Term Goal 4 - Progress (Week 1): Not met OT Short Term Goal 5 (Week 1): Patient will report pain in right wrist reduced to 4/10 following gentle range/ mobilization OT Short Term Goal 5 - Progress (Week 1): Met  Skilled Therapeutic Interventions/Progress Updates:    Patient seated in the recliner at the time of arrival finishing lunch with family present.  The pt  indicated that he felt fine and that he had no pain to report.  At the beginning of the session, the patient was finishing his drink, he was encourage to take small sips and to tuck his chin as a safety measure.  The pt agreed on completing UB exercise during this treatment session.  The pt was able to  complete the UB cycle for 15 minutes in duration while seated upright with his back unsupported with initial cues followed by an  occasional break, the pt required 3 breaks in total.   The pt went on to complete UB exercises using a 1lb dowel  for 1 set of 5 with shld flex on frontal plane, shld rotation, and horizontal abduction. The pt required 2 rest breaks.  At the close of the session, The call light and bedside table were both placed within reach with all additional needs addressed.   Therapy Documentation Precautions:   Precautions Precautions: Fall, Other (comment) (DNR) Precaution/Restrictions Comments: Urinary incontinence, DNR Required Braces or Orthoses: Splint/Cast Splint/Cast: R wrist cock up splint Restrictions Weight Bearing Restrictions Per Provider Order: No Other Position/Activity Restrictions: as tolerates General:   Vital Signs: Therapy Vitals Temp: 98 F (36.7 C) Pulse Rate: 70 Resp: 20 BP: 117/68 Patient Position (if appropriate): Sitting Oxygen Therapy SpO2: 98 % O2 Device: Room Air Pain:   ADL: ADL Eating: Not assessed Grooming: Minimal assistance Where Assessed-Grooming: Sitting at sink Upper Body Bathing: Minimal assistance Where Assessed-Upper Body Bathing: Shower Lower Body Bathing: Moderate assistance Where Assessed-Lower Body Bathing: Sitting at sink, Standing at sink Upper Body Dressing: Moderate assistance Where Assessed-Upper Body Dressing: Sitting at sink Lower Body Dressing: Maximal assistance Where Assessed-Lower Body Dressing: Sitting at sink, Standing at sink Toileting: Unable to assess Toilet Transfer: Unable to assess Toilet Transfer Method: Unable to assess Tub/Shower Transfer: Unable to assess Film/video editor: Moderate assistance Film/video editor Method: Warden/ranger: Scientist, research (physical sciences)   Exercises:   Other Treatments:     Therapy/Group: Individual Therapy  Allen Dyer 09/16/2023, 4:35 PM

## 2023-09-17 ENCOUNTER — Inpatient Hospital Stay (HOSPITAL_COMMUNITY)

## 2023-09-17 DIAGNOSIS — R270 Ataxia, unspecified: Secondary | ICD-10-CM

## 2023-09-17 DIAGNOSIS — R5383 Other fatigue: Secondary | ICD-10-CM

## 2023-09-17 LAB — CBC WITH DIFFERENTIAL/PLATELET
Abs Immature Granulocytes: 0.02 10*3/uL (ref 0.00–0.07)
Basophils Absolute: 0 10*3/uL (ref 0.0–0.1)
Basophils Relative: 0 %
Eosinophils Absolute: 0.1 10*3/uL (ref 0.0–0.5)
Eosinophils Relative: 3 %
HCT: 34.1 % — ABNORMAL LOW (ref 39.0–52.0)
Hemoglobin: 11 g/dL — ABNORMAL LOW (ref 13.0–17.0)
Immature Granulocytes: 0 %
Lymphocytes Relative: 20 %
Lymphs Abs: 0.9 10*3/uL (ref 0.7–4.0)
MCH: 32.4 pg (ref 26.0–34.0)
MCHC: 32.3 g/dL (ref 30.0–36.0)
MCV: 100.3 fL — ABNORMAL HIGH (ref 80.0–100.0)
Monocytes Absolute: 0.4 10*3/uL (ref 0.1–1.0)
Monocytes Relative: 10 %
Neutro Abs: 3 10*3/uL (ref 1.7–7.7)
Neutrophils Relative %: 67 %
Platelets: 320 10*3/uL (ref 150–400)
RBC: 3.4 MIL/uL — ABNORMAL LOW (ref 4.22–5.81)
RDW: 12.8 % (ref 11.5–15.5)
WBC: 4.5 10*3/uL (ref 4.0–10.5)
nRBC: 0 % (ref 0.0–0.2)

## 2023-09-17 LAB — GLUCOSE, CAPILLARY
Glucose-Capillary: 96 mg/dL (ref 70–99)
Glucose-Capillary: 153 mg/dL — ABNORMAL HIGH (ref 70–99)
Glucose-Capillary: 180 mg/dL — ABNORMAL HIGH (ref 70–99)
Glucose-Capillary: 215 mg/dL — ABNORMAL HIGH (ref 70–99)

## 2023-09-17 LAB — BASIC METABOLIC PANEL WITH GFR
Anion gap: 7 (ref 5–15)
BUN: 35 mg/dL — ABNORMAL HIGH (ref 8–23)
CO2: 28 mmol/L (ref 22–32)
Calcium: 9.9 mg/dL (ref 8.9–10.3)
Chloride: 106 mmol/L (ref 98–111)
Creatinine, Ser: 2.16 mg/dL — ABNORMAL HIGH (ref 0.61–1.24)
GFR, Estimated: 29 mL/min — ABNORMAL LOW (ref >60)
Glucose, Bld: 96 mg/dL (ref 70–99)
Potassium: 3.7 mmol/L (ref 3.5–5.1)
Sodium: 141 mmol/L (ref 135–145)

## 2023-09-17 LAB — VITAMIN D 25 HYDROXY (VIT D DEFICIENCY, FRACTURES): Vit D, 25-Hydroxy: 57.5 ng/mL (ref 30–100)

## 2023-09-17 NOTE — Progress Notes (Signed)
 Occupational Therapy Session Note  Patient Details  Name: Allen Dyer MRN: [id number] Date of Birth: May 15, 1935  Today's Date: 09/17/2023 OT Individual Time: 1305-1330 OT Individual Time Calculation (min): 25 min    Short Term Goals: Week 2:  OT Short Term Goal 1 (Week 2): Patient will assist with lower body dressing in bed by bridging/ half bridging OT Short Term Goal 2 (Week 2): Patient will safely sit at edge of bed with min assist when preparing for a trasnfer to wheelchair/recliner OT Short Term Goal 3 (Week 2): Patient will safely statically sit x 3 min in preparation for toileting activities OT Short Term Goal 4 (Week 2): Patient will complete hygiene at sink with set up assist OT Short Term Goal 5 (Week 2): Patient will feed himself and drink from a cup with intermittent min assist  Skilled Therapeutic Interventions/Progress Updates:    Patient agreeable to participate in OT session. Reports 0/10 pain level.  Daughter and grandson in room visiting.  Patient participated in skilled OT session focusing on sit to stand transitions, standing balance and tolerance.  Pt requested to work on standing during session as well as change pants that he was wearing.   Stedy utilized to complete sit to stand transitions. Pt required max A for initial sit to stand. Pt assisted with LB dressing and was able to push shoes off bilateral feet. Primarily required max A for changing pants and donning shoes. Second sit to stand transition, pt required only CGA to power up into standing with use of Stedy.    Therapy Documentation Precautions:  Precautions Precautions: Fall, Other (comment) (DNR) Precaution/Restrictions Comments: Urinary incontinence, DNR Required Braces or Orthoses: Splint/Cast Splint/Cast: R wrist cock up splint Restrictions Weight Bearing Restrictions Per Provider Order: No Other Position/Activity Restrictions: as tolerates  Therapy/Group: Individual Therapy  Carollee Circle, OTR/L,CBIS  Supplemental OT - [facility] and [facility] Secure Chat Preferred   09/17/2023, 7:01 AM

## 2023-09-17 NOTE — Progress Notes (Signed)
 Occupational Therapy Session Note  Patient Details  Name: Allen Dyer MRN: [id number] Date of Birth: 1936/03/17  Today's Date: 09/17/2023 OT Individual Time: 0347-4259 OT Individual Time Calculation (min): 44 min    Short Term Goals: Week 2:  OT Short Term Goal 1 (Week 2): Patient will assist with lower body dressing in bed by bridging/ half bridging OT Short Term Goal 2 (Week 2): Patient will safely sit at edge of bed with min assist when preparing for a trasnfer to wheelchair/recliner OT Short Term Goal 3 (Week 2): Patient will safely statically sit x 3 min in preparation for toileting activities OT Short Term Goal 4 (Week 2): Patient will complete hygiene at sink with set up assist OT Short Term Goal 5 (Week 2): Patient will feed himself and drink from a cup with intermittent min assist  Skilled Therapeutic Interventions/Progress Updates: Patient received resting in bed. Reports having a poor nights sleep but agreeable to OT treatment including a shower. Used Stedy for transfer into bathroom due to fatigue. Patient able to transition from supine to sitting EOB with Mod assist and stood with bar on stedy with Mod assist. Had a tremor while standing that patient reports not being his norm. Assisted to sit on toilet- needed help to doff brief and perform peri care. Transferred to tub transfer bench in roll in shower. Patient able to wash upper body and upper legs with set up/Min assist for thoroughness. Assisted out of shower with stedy- and dressed EOB. Patient with good tolerance and participation. See full levels below. Continue with skilled OT POC to improve independence and safety with functional transfers and self care skills.     Therapy Documentation Precautions:  Precautions Precautions: Fall, Other (comment) (DNR) Precaution/Restrictions Comments: Urinary incontinence, DNR Required Braces or Orthoses: Splint/Cast Splint/Cast: R wrist cock up splint Restrictions Weight Bearing  Restrictions Per Provider Order: No Other Position/Activity Restrictions: as tolerates General:   Vital Signs:   Pain: Pain Assessment Pain Scale: 0-10 Pain Score: 0-No pain ADL: ADL  Grooming: Minimal assistance Where Assessed-Grooming: Sitting at sink Upper Body Bathing: Minimal assistance Where Assessed-Upper Body Bathing: Shower Lower Body Bathing: Moderate assistance Where Assessed-Lower Body Bathing: shower Upper Body Dressing: Moderate assistance Where Assessed-Upper Body Dressing: EOB Lower Body Dressing: Maximal assistance Where Assessed-Lower Body Dressing: EOB Toileting: Total assist Toilet Transfer: Max assist Toilet Transfer Method: Media planner Transfer: Max TEFL teacher Method: [employer] Shower Equipment: Emergency planning/management officer    Therapy/Group: Individual Therapy  Marty Sleet 09/17/2023, 12:13 PM

## 2023-09-17 NOTE — Progress Notes (Signed)
 Speech Language Pathology Daily Session Note  Patient Details  Name: Allen Dyer MRN: [id number] Date of Birth: May 03, 1935  Today's Date: 09/17/2023 SLP Group Time: 1200-1300 SLP Group Time Calculation (min): 60 min  Short Term Goals: Week 1: SLP Short Term Goal 1 (Week 1): Patient will complete MBS to determine least restrictive safe diet. SLP Short Term Goal 2 (Week 1): Patient will orient to date utilizing external aids when provided with mod verbal cues. SLP Short Term Goal 3 (Week 1): Patient will selectively attend to speech therapy tasks for 10 minute intervals given mod assist. SLP Short Term Goal 4 (Week 1): Patient will recall pertinent daily information with 80% accuracy utilizing external/internal aids when given mod assist. SLP Short Term Goal 5 (Week 1): Patient will solve basic environmental problems with 80% accuracy given mod assist. SLP Short Term Goal 6 (Week 1): Patient will utilize speech intelligibility and communication repair strategies in 8/10 opportunities at the phrase level given mod assist.  Group Description: [employer]: Patient participated in [employer] with SLP with focus on dysphagia goals, cognitive goals, and appropriate social interaction within a group environment.    Individual level documentation:  Patient participated in diner's club with focus on dysphagia goals. Patient consumed their lunch meal of dysphasia 3 and thin liquids from [employer] cup. Patient consumed meal without overt s/s of aspiration and required min assist for use of swallowing compensatory strategies. Recommend patient continue current diet.    Patient participated in diner's club with focus on cognitive goals. Patient required (drop down selection supervision assist for selective attention in a mildly distracting environment. Patient also demonstrated appropriate social interaction within a group environment with overall mod I assist.     Pain  None  endorsed  Therapy/Group: Dysphagia Group  Allen Dyer, M.A., CCC-SLP  Afsana Liera A Weaver Tweed 09/17/2023, 1:58 PM

## 2023-09-17 NOTE — Progress Notes (Signed)
 Physical Therapy Session Note  Patient Details  Name: Allen Dyer MRN: [id number] Date of Birth: 21-May-1935  Today's Date: 09/17/2023 PT Individual Time: 1334-1415 PT Individual Time Calculation (min): 41 min   Short Term Goals: Week 2:  PT Short Term Goal 1 (Week 2): Pt will complete bed<>chair transfers with maxA PT Short Term Goal 2 (Week 2): Pt will propel wheelchair ~63ft with modA  Skilled Therapeutic Interventions/Progress Updates:      Pt presents in wheelchair and has no complaints of pain. No longer has catheter and pt pleased about this. Transported patient to main gym. Sit<>stand to // bars while facing large mirror with minA. Patient with significant truncal ataxia and difficulty managing his rocking. Applied pressure to both upper shoulders but limited improvement. Donned weighted vest for remaining stands during session which did appear to improve his symptoms. Also donned 7.5# ankle weight to his LLE and 5# to his RLE to help weight his legs down more. Sit<>stands with CGA/minA from pulling to stand and CGA/minA for standing balance. Carlie Chen to seize and quite the ataxia with time and visual feedback. Able to progress to a few standing marching but limited by ataxia and fatigue. Completed several stands during session with weighted vest on. Returned to his room and patient left sitting up with seat belt alarm on and call bell in reach.   Therapy Documentation Precautions:  Precautions Precautions: Fall, Other (comment) (DNR) Precaution/Restrictions Comments: Urinary incontinence, DNR Required Braces or Orthoses: Splint/Cast Splint/Cast: R wrist cock up splint Restrictions Weight Bearing Restrictions Per Provider Order: No Other Position/Activity Restrictions: as tolerates General:    Therapy/Group: Individual Therapy  Pheobe Brass 09/17/2023, 7:46 AM

## 2023-09-17 NOTE — Progress Notes (Signed)
 Speech Language Pathology Daily Session Note  Patient Details  Name: Allen Dyer MRN: [id number] Date of Birth: March 10, 1936  Today's Date: 09/17/2023 SLP Individual Time: 1500-1530 SLP Individual Time Calculation (min): 30 min  Short Term Goals: Week 1: SLP Short Term Goal 1 (Week 1): Patient will complete MBS to determine least restrictive safe diet. SLP Short Term Goal 2 (Week 1): Patient will orient to date utilizing external aids when provided with mod verbal cues. SLP Short Term Goal 3 (Week 1): Patient will selectively attend to speech therapy tasks for 10 minute intervals given mod assist. SLP Short Term Goal 4 (Week 1): Patient will recall pertinent daily information with 80% accuracy utilizing external/internal aids when given mod assist. SLP Short Term Goal 5 (Week 1): Patient will solve basic environmental problems with 80% accuracy given mod assist. SLP Short Term Goal 6 (Week 1): Patient will utilize speech intelligibility and communication repair strategies in 8/10 opportunities at the phrase level given mod assist.  Skilled Therapeutic Interventions:  Patient was seen in PM to address speech intelligibility and cognitive re- training. Session initiation delay by 15 minutes due to toileting. Upon return, pt was alert and seated upright in WC. He denied pain however endorsed fatigue. Pt oriented to month and year indep. He was not oriented to day of week or holiday. Given review of information, he later recalled it at conclusion of session indep. Pt recalled 1/4 speech intelligibility strategies indep. SLP re-educated on remaining strategies. Pt reports his speech progress feels stagnant but also reports that family members have commented on his improvement. SLP engaged pt in communication of biographical and personal information. Pt warranting sup to min A for pausing, rate, and over articulation with maintenance of 75% intelligibility in known context at word and phrase level.  Intelligibility ~60-70% at sentence levels. SLP addressed problems solving through identifying solutions to problems within current environment given scenarios. Pt completed task with 80% acc and min A. At conclusion of session, pt was left upright in chair with call button within reach and chair alarm active. SLP to continue POC.   Pain Pain Assessment Pain Scale: 0-10 Pain Score: 0-No pain  Therapy/Group: Individual Therapy  Adela Holter 09/17/2023, 4:05 PM

## 2023-09-17 NOTE — Progress Notes (Signed)
 PROGRESS NOTE   Subjective/Complaints: Patient without complaints this morning.  Has good appetite.  Did not rest that well but feels more alert in general.  ROS: Patient denies fever, rash, sore throat, blurred vision, dizziness, nausea, vomiting, diarrhea, cough, shortness of breath or chest pain, joint or back/neck pain, headache, or mood change.   Objective:   No results found.   Recent Labs    09/17/23 0620  WBC 4.5  HGB 11.0*  HCT 34.1*  PLT 320    Recent Labs    09/17/23 0620  NA 141  K 3.7  CL 106  CO2 28  GLUCOSE 96  BUN 35*  CREATININE 2.16*  CALCIUM  9.9     Intake/Output Summary (Last 24 hours) at 09/17/2023 0919 Last data filed at 09/17/2023 0800 Gross per 24 hour  Intake 240 ml  Output --  Net 240 ml        Physical Exam: Vital Signs Blood pressure 126/72, pulse 70, temperature 98.6 F (37 C), temperature source Oral, resp. rate 18, height 5\' 7"  (1.702 m), weight 89.9 kg, SpO2 98%.  Constitutional: No distress . Vital signs reviewed. HEENT: NCAT, EOMI, oral membranes moist Neck: supple Cardiovascular: RRR without murmur. No JVD    Respiratory/Chest: CTA Bilaterally without wheezes or rales. Normal effort    GI/Abdomen: BS +, non-tender, non-distended Ext: no clubbing, cyanosis, or edema Psych: pleasant and cooperative    Skin: intact; mild right upper extremity swelling/edema  Neuro:very dysarthric ataxic speech, improved conversation and awareness. Moves all 4's but does demonstrate ataxia   No tone or spasticity noted.  Cranial nerves grossly intact.   MSK: R  arm remains in splint        Assessment/Plan: 1. Functional deficits which require 3+ hours per day of interdisciplinary therapy in a comprehensive inpatient rehab setting. Physiatrist is providing close team supervision and 24 hour management of active medical problems listed below. Physiatrist and rehab team  continue to assess barriers to discharge/monitor patient progress toward functional and medical goals  Care Tool:  Bathing    Body parts bathed by patient: Right arm, Chest, Abdomen, Face   Body parts bathed by helper: Right arm, Left arm, Chest, Abdomen, Front perineal area, Buttocks, Right upper leg, Left upper leg, Right lower leg, Left lower leg, Face (shower)     Bathing assist Assist Level: Total Assistance - Patient < 25%     Upper Body Dressing/Undressing Upper body dressing   What is the patient wearing?: Pull over shirt    Upper body assist Assist Level: Maximal Assistance - Patient 25 - 49%    Lower Body Dressing/Undressing Lower body dressing      What is the patient wearing?: Pants, Incontinence brief     Lower body assist Assist for lower body dressing: Total Assistance - Patient < 25%     Toileting Toileting Toileting Activity did not occur (Clothing management and hygiene only): N/A (no void or bm)  Toileting assist Assist for toileting: Total Assistance - Patient < 25%     Transfers Chair/bed transfer  Transfers assist     Chair/bed transfer assist level: Total Assistance - Patient < 25%  Locomotion Ambulation   Ambulation assist   Ambulation activity did not occur: Safety/medical concerns  Assist level: Moderate Assistance - Patient 50 - 74% Assistive device: Hand held assist Max distance: 19ft   Walk 10 feet activity   Assist  Walk 10 feet activity did not occur: Safety/medical concerns  Assist level: Moderate Assistance - Patient - 50 - 74% Assistive device: Hand held assist   Walk 50 feet activity   Assist Walk 50 feet with 2 turns activity did not occur: Safety/medical concerns  Assist level: Minimal Assistance - Patient > 75% Assistive device: Walker-Eva    Walk 150 feet activity   Assist Walk 150 feet activity did not occur: Safety/medical concerns  Assist level: Minimal Assistance - Patient > 75% Assistive  device: Walker-Eva    Walk 10 feet on uneven surface  activity   Assist Walk 10 feet on uneven surfaces activity did not occur: Safety/medical concerns         Wheelchair     Assist Is the patient using a wheelchair?: Yes Type of Wheelchair: Manual    Wheelchair assist level: Dependent - Patient 0%      Wheelchair 50 feet with 2 turns activity    Assist        Assist Level: Dependent - Patient 0%   Wheelchair 150 feet activity     Assist      Assist Level: Dependent - Patient 0%   Blood pressure 126/72, pulse 70, temperature 98.6 F (37 C), temperature source Oral, resp. rate 18, height 5\' 7"  (1.702 m), weight 89.9 kg, SpO2 98%.    Medical Problem List and Plan: 1. Functional deficits secondary to bilateral cerebellar infarct, right more than left secondary large vessel disease from severe posterior circulation stenosis             -patient may shower             -ELOS/Goals: 10-12,PT/OT sup to mod I             -Grounds pass ordered  -MRI repeat 5/17, was discussed with neurology Dr. Bonnita Buttner , not acute cva but some progression of prior strokes. No additional changes, avoid hypotension   -Consult SLP- worsened dysarthria over past few days  - 5-19: Multiple messages from therapist and nursing concerns regarding worsening dysarthria, ataxia, and right facial sensory loss.  No new focal deficits on exam, however symptoms did not improve with medication adjustments.  Stat CT head without acute findings.  No hemorrhagic transformation or mass effect.  5/26-pt alert and appropriate today. -Continue CIR therapies including PT, OT, and SLP  2.  Antithrombotics: -DVT/anticoagulation:  Pharmaceutical: Lovenox              -antiplatelet therapy: Aspirin  81 mg daily and Plavix  75 mg day x 3 months then Plavix  alone 3. Pain Management: Neurontin  300 mg twice daily--well-controlled 4. Mood/Behavior/Sleep: Provide emotional support             -antipsychotic agents:  N/A 5. Neuropsych/cognition: This patient is capable of making decisions on his own behalf.  -5/18 start lexapro  for mood, consider neuropsych consult  5/19: DC lexapro  d/t worsening lethargy, dysarthria   6. Skin/Wound Care: Routine skin checks  5-19: Right upper extremity duplex ordered prior for swelling negative for DVT, however with multiple cystic structures concerning for abscess.  CT recommended for further assessment, order placed.  No current fevers or leukocytosis to indicate infection.  7. Fluids/Electrolytes/Nutrition: Routine in and outs with follow-up chemistries 8.  Klebsiella oxytoca UTI.  Completing course of Duricef  - Per urology note, is to continue on cefadroxil  1 tab p.o. nightly for CAUTI prophylaxis while Foley is in place  9.  CKD stage IV.  Discussed that creatinine is improving  -Cr overalls stable 2.47/BUN 50- monitor  5-26 Cr in range of recent labs 10.  Chronic combined systolic and diastolic congestive heart failure. EF 25-30% Monitor for any signs of fluid overload.  Follow-up cardiology services.  Continue Demadex  60 mg twice daily, weight reviewed and is increased, daily weights ordered, weights are stable/increased, continue current regimen  5/17 wt trending up although suspect may be inaccurate, no signs of overload noted  5/18 Wt appears stable, monitor  5/19: weights downtrending, ? Stroke recrudescence with normotension d/t stenosis, reduce torsemide  to 60 mg daily  5/26 sl downward trend still. Was eating well this morning -continue to encourage intake   Filed Weights   09/14/23 0500 09/16/23 0515 09/17/23 0455  Weight: 91.4 kg 90.9 kg 89.9 kg     11.  Nonischemic cardiomyopathy.  Followed by cardiology service Dr. Micael Adas.  Continue Imdur  30 mg daily and Coreg  6.25 mg twice daily.  See #10.  12.  History of prostate cancer, BPH.  Followed by Dr. Freddi Jaeger.  Flomax  0.4 mg twice daily             -Check PVR/bladder scan  5/17-8 he has foley in placed,  clear yellow urine  5/26 pt is emptying incontinently  13.  Diabetes mellitus with peripheral neuropathy and hemoglobin A1c 7.0.  Glucotrol  5 mg daily  -5/18 few elevated CBGs, continue to monitor trend for now  5-19: BG is generally well-controlled, monitor  CBG (last 3)  Recent Labs    09/16/23 1647 09/16/23 2037 09/17/23 0550  GLUCAP 155* 191* 96     14. HTN. Long term goal normotensive. Home meds carvedilol , imdur , toresemide  -5/18 would like avoid hypotension, decrease Imdur  to 15 mg, decrease Coreg  to 3.125 twice daily, decrease torsemide  to daily  5/19: Weight down, BP normotensive but may be underperfusing; reduce torsemide  as above. Can consider reducing flomax  as well pending DC foley trial but cannot stay off long term d/t chronic retention.  5/26 bp controlled Vitals:   09/16/23 2036 09/17/23 0435  BP: 134/69 126/72  Pulse: 72 70  Resp: 18 18  Temp: 97.8 F (36.6 C) 98.6 F (37 C)  SpO2: 97% 98%      15. Obesity. Body mass index is 33.67 kg/m.             -dietary education  16. R wrist pain due to fall. Wearing wrist brace. Xray without fracture. Voltaren  gel scheduled             -tylenol  prn  -5/17 Will check vas US  due to R arm swelling  5-19: See above; no DVT, possible abscesses/cystic structures.    5/20: CT reviewed and shows age indeterminate fracture, will consult ortho  17. Constipation: D/c miralax , d/c [place], encouraged prune juice, asked nursing to bring prune juice for him  Last bowel movement 5/21  18. Dizziness/vertigo/nystagmus: vestibular eval ordered   Decrease meclizine  to daily given lethargy.  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  Scopolamine patch added  19. Anemia: Hgb reviewed and is slightly improved  20. AMS: MRI brain and CT Head obtained without acute concerning findings, have consulted neurology to review, does appear better today  21. Drooling: scopolamine patch added, helping, continue  22.  Lethargy:  modafinil ordered, helping, continue  5/26 appears alert today 23. Penile swelling: placed order to d/c foley, will message nurse regarding status  24. Type 7 stool: last BM 5/25, continue metamucil    27. Screening for vitamin D  deficiency: check vitamin D  with labs tomorrow, increase daily supplement to 2,000U  LOS: 12 days A FACE TO FACE EVALUATION WAS PERFORMED  Allen Dyer 09/17/2023, 9:19 AM

## 2023-09-18 LAB — GLUCOSE, CAPILLARY
Glucose-Capillary: 100 mg/dL — ABNORMAL HIGH (ref 70–99)
Glucose-Capillary: 158 mg/dL — ABNORMAL HIGH (ref 70–99)
Glucose-Capillary: 151 mg/dL — ABNORMAL HIGH (ref 70–99)
Glucose-Capillary: 168 mg/dL — ABNORMAL HIGH (ref 70–99)

## 2023-09-18 NOTE — Progress Notes (Signed)
 Occupational Therapy Session Note  Patient Details  Name: Allen Dyer MRN: [id number] Date of Birth: 1935-12-12  Today's Date: 09/18/2023 OT Individual Time: 1415-1500 OT Individual Time Calculation (min): 45 min    Short Term Goals: Week 1:  OT Short Term Goal 1 (Week 1): Patient will transfer to shower with min assist OT Short Term Goal 1 - Progress (Week 1): Not met OT Short Term Goal 2 (Week 1): Patient will transfer to toilet with min assist OT Short Term Goal 2 - Progress (Week 1): Not met OT Short Term Goal 3 (Week 1): Patient will dress upper body with set up assist OT Short Term Goal 3 - Progress (Week 1): Not met OT Short Term Goal 4 (Week 1): Patient will dress lower body with mod assist OT Short Term Goal 4 - Progress (Week 1): Not met OT Short Term Goal 5 (Week 1): Patient will report pain in right wrist reduced to 4/10 following gentle range/ mobilization OT Short Term Goal 5 - Progress (Week 1): Met Week 2:  OT Short Term Goal 1 (Week 2): Patient will assist with lower body dressing in bed by bridging/ half bridging OT Short Term Goal 2 (Week 2): Patient will safely sit at edge of bed with min assist when preparing for a trasnfer to wheelchair/recliner OT Short Term Goal 3 (Week 2): Patient will safely statically sit x 3 min in preparation for toileting activities OT Short Term Goal 4 (Week 2): Patient will complete hygiene at sink with set up assist OT Short Term Goal 5 (Week 2): Patient will feed himself and drink from a cup with intermittent min assist  Skilled Therapeutic Interventions/Progress Updates:    1:1 NMR with focus on transitional movement with progressing mobility while controlling ataxia. Pt transferred from w/c to mat with slide board with focus on maintaining forward weight shift and maintaining feet in proper positioning. Slowly pt was able to transfer with min A with max cues for mechanics. Sitting on the mat both hands placed on kaye table and perform  sit to mini squats maintaining closed chained position- 10 reps. Progressed to sit to stands with the EVA walker progressing the height of the EVA as he stood 10 reps. Then progressed to walking with the EVA 60 feet with cues for maintaining upright posture and pushing down through feet. 2nd person steering EVA and therapist faciliating at his hips.  Returned to the room and performed brief change in standing with the EVA and then transferred back to the bed with the EVA; practicing stepping backwards with mod cues.   Pt returned to supine with mod A for LE management.   Therapy Documentation Precautions:  Precautions Precautions: Fall, Other (comment) (DNR) Precaution/Restrictions Comments: Urinary incontinence, DNR Required Braces or Orthoses: Splint/Cast Splint/Cast: R wrist cock up splint Restrictions Weight Bearing Restrictions Per Provider Order: No RUE Weight Bearing Per Provider Order: Weight bearing as tolerated Other Position/Activity Restrictions: as tolerates  Pain:  No c/o pain in session   Therapy/Group: Individual Therapy  Henrene Locust [facility] 09/18/2023, 3:50 PM

## 2023-09-18 NOTE — Plan of Care (Signed)
  Problem: Consults Goal: RH STROKE PATIENT EDUCATION Description: See Patient Education module for education specifics  Outcome: Progressing   Problem: RH BOWEL ELIMINATION Goal: RH STG MANAGE BOWEL WITH ASSISTANCE Description: STG Manage Bowel with supervision Assistance. Outcome: Progressing   Problem: RH BOWEL ELIMINATION Goal: RH STG MANAGE BOWEL W/MEDICATION W/ASSISTANCE Description: STG Manage Bowel with Medication with Assistance. Outcome: Progressing   Problem: RH BLADDER ELIMINATION Goal: RH STG MANAGE BLADDER WITH ASSISTANCE Description: STG Manage Bladder With supervision Assistance Outcome: Progressing   Problem: RH SKIN INTEGRITY Goal: RH STG SKIN FREE OF INFECTION/BREAKDOWN Description: Manage skin free of infection/breakdown with supervision assistance Outcome: Progressing   Problem: RH KNOWLEDGE DEFICIT Goal: RH STG INCREASE KNOWLEDGE OF STROKE PROPHYLAXIS Description: Manage knowledge of stroke prophylaxis with supervision assistance from daughter using educational materials provided Outcome: Progressing   Problem: RH KNOWLEDGE DEFICIT Goal: RH STG INCREASE KNOWLEGDE OF HYPERLIPIDEMIA Description: Manage knowledge of hyperlipidemia with supervision assistance from daughter using educational materials provided Outcome: Progressing   Problem: RH KNOWLEDGE DEFICIT Goal: RH STG INCREASE KNOWLEDGE OF HYPERTENSION Description: Manage knowledge of hypertension with supervision assistance from daughter using educational materials provided Outcome: Progressing   Problem: RH KNOWLEDGE DEFICIT Goal: RH STG INCREASE KNOWLEDGE OF DIABETES Description: Manage knowledge of diabetes with supervision assistance from daughter using educational materials provided Outcome: Progressing

## 2023-09-18 NOTE — Progress Notes (Signed)
 Physical Therapy Session Note  Patient Details  Name: Allen Dyer MRN: [id number] Date of Birth: 06/03/1935  Today's Date: 09/18/2023 PT Individual Time: 1000-1025 + 4098-1191 PT Individual Time Calculation (min): 25 min + 30 min PT Missed Time: 50 Minutes Missed Time Reason: Nursing care ]  Short Term Goals: Week 2:  PT Short Term Goal 1 (Week 2): Pt will complete bed<>chair transfers with maxA PT Short Term Goal 2 (Week 2): Pt will propel wheelchair ~51ft with modA  Skilled Therapeutic Interventions/Progress Updates:      1st session: Family attempting to get patient OOB with bed alarm going off - no staff in room to assist. Educated family on precautions and need for staff to assist with transfers. Pt assisted via [place] to the bathroom, minA for standing and CGA for sitting balance in perched position. Pt assisted onto the raised toilet seat with minA for controlled lowering. Pt continent x2 - notified RN for charting purposes. While patient was on toilet, discussed with family (daughter and grandson) the PT POC, PT goals, pt's current level of mobility, etc. Daughter reports they are ready to [facility] but they're hopeful he will continue to progress in function. NP from urology present and requesting bladder scan after toileting to see if retaining urine. Patient provided totalA for posterior pericare using the [facility] and donned a new clean brief. Returned to bed via [place] and completed sit>supine with modA with hospital bed features. Pt missed the last 50 minutes of therapy for bladder scan.   2nd session: Returned at 10:45 to make up missed time from earlier session. Pt in bed and finished with his bladder scan. Pt denies any pain. Assisted with donning pants at bed level with totalA (pt <25%). Attempted to bridge to clear hips to pull pants up but patient unable to do this so he completed rolling in bed with min/modA to pull pants over bottom.  Supine<>sitting EOB with mod/maxA with HOB lowered. Completed SB transfer from EOB into w/c with mod/maxA with assist needed for scooting and lifting hips during transfer. Pt transported to ortho gym at w/c level and assisted onto Nustep with a stand pivot transfer at mod/maxA, limited by truncal ataxia and BLE ataxia. Setup on Nustep where he completed 12 minutse at L5 resistance with BUE/BLE to encourage reciprocal stepping and coordination training. Completed > 214ft steps during this time. Assisted back to his w/c with similar method as above and then returned back to his room. Seat belt alarm on and call bell within reach at end of treatment.   Therapy Documentation Precautions:  Precautions Precautions: Fall, Other (comment) (DNR) Precaution/Restrictions Comments: Urinary incontinence, DNR Required Braces or Orthoses: Splint/Cast Splint/Cast: R wrist cock up splint Restrictions Weight Bearing Restrictions Per Provider Order: No RUE Weight Bearing Per Provider Order: Weight bearing as tolerated Other Position/Activity Restrictions: as tolerates General:    Therapy/Group: Individual Therapy  Pheobe Brass 09/18/2023, 7:53 AM

## 2023-09-18 NOTE — Progress Notes (Signed)
 PROGRESS NOTE   Subjective/Complaints: No new complaints this morning Denies pain Patient's chart reviewed- No issues reported overnight Vitals signs stable except for bradycardia  ROS: Patient denies fever, rash, sore throat, blurred vision, dizziness, nausea, vomiting, diarrhea, cough, shortness of breath or chest pain, joint or back/neck pain, headache, or mood change.   Objective:   CT HEAD WO CONTRAST ( ) Result Date: 09/17/2023 CLINICAL DATA:  Stroke follow-up EXAM: CT HEAD WITHOUT CONTRAST TECHNIQUE: Contiguous axial images were obtained from the base of the skull through the vertex without intravenous contrast. RADIATION DOSE REDUCTION: This exam was performed according to the departmental dose-optimization program which includes automated exposure control, adjustment of the mA and/or kV according to patient size and/or use of iterative reconstruction technique. COMPARISON:  09/11/2023 FINDINGS: Brain: Unchanged hypoattenuation within the middle cerebellar peduncles. 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 No mass, hemorrhage or extra-axial collection. Vascular: Atherosclerotic calcification of the internal carotid arteries at the skull base. No abnormal hyperdensity of the major intracranial arteries or dural venous sinuses. Skull: The visualized skull base, calvarium and extracranial soft tissues are normal. Sinuses/Orbits: No fluid levels or advanced mucosal thickening of the visualized paranasal sinuses. No mastoid or middle ear effusion. Normal orbits. Other: None. IMPRESSION: 1. Unchanged hypoattenuation within the middle cerebellar peduncles, corresponding to areas of diffusion restriction previously seen. 2. 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 Electronically Signed   By: Juanetta Nordmann M.D.   On: 09/17/2023 20:00     Recent Labs    09/17/23 0620  WBC 4.5  HGB 11.0*  HCT 34.1*  PLT 320    Recent Labs     09/17/23 0620  NA 141  K 3.7  CL 106  CO2 28  GLUCOSE 96  BUN 35*  CREATININE 2.16*  CALCIUM  9.9     Intake/Output Summary (Last 24 hours) at 09/18/2023 1129 Last data filed at 09/17/2023 2200 Gross per 24 hour  Intake 1200 ml  Output --  Net 1200 ml        Physical Exam: Vital Signs Blood pressure 126/76, pulse (!) 54, temperature 98 F (36.7 C), temperature source Oral, resp. rate 16, height 5\' 7"  (1.702 m), weight 89.7 kg, SpO2 96%.  Constitutional: No distress . Vital signs reviewed. HEENT: NCAT, EOMI, oral membranes moist Neck: supple Cardiovascular: Bradycardia Respiratory/Chest: CTA Bilaterally without wheezes or rales. Normal effort    GI/Abdomen: BS +, non-tender, non-distended Ext: no clubbing, cyanosis, or edema Psych: pleasant and cooperative    Skin: intact; mild right upper extremity swelling/edema  Neuro:very dysarthric ataxic speech, improved conversation and awareness. Moves all 4's but does demonstrate ataxia   No tone or spasticity noted.  Cranial nerves grossly intact.   MSK: R  arm remains in splint        Assessment/Plan: 1. Functional deficits which require 3+ hours per day of interdisciplinary therapy in a comprehensive inpatient rehab setting. Physiatrist is providing close team supervision and 24 hour management of active medical problems listed below. Physiatrist and rehab team continue to assess barriers to discharge/monitor patient progress toward functional and medical goals  Care Tool:  Bathing    Body parts bathed by patient: Right arm,  Chest, Abdomen, Face   Body parts bathed by helper: Right arm, Left arm, Chest, Abdomen, Front perineal area, Buttocks, Right upper leg, Left upper leg, Right lower leg, Left lower leg, Face (shower)     Bathing assist Assist Level: Total Assistance - Patient < 25%     Upper Body Dressing/Undressing Upper body dressing   What is the patient wearing?: Pull over shirt    Upper body  assist Assist Level: Maximal Assistance - Patient 25 - 49%    Lower Body Dressing/Undressing Lower body dressing      What is the patient wearing?: Pants, Incontinence brief     Lower body assist Assist for lower body dressing: Total Assistance - Patient < 25%     Toileting Toileting Toileting Activity did not occur (Clothing management and hygiene only): N/A (no void or bm)  Toileting assist Assist for toileting: Total Assistance - Patient < 25%     Transfers Chair/bed transfer  Transfers assist     Chair/bed transfer assist level: Total Assistance - Patient < 25%     Locomotion Ambulation   Ambulation assist   Ambulation activity did not occur: Safety/medical concerns  Assist level: Moderate Assistance - Patient 50 - 74% Assistive device: Hand held assist Max distance: 29ft   Walk 10 feet activity   Assist  Walk 10 feet activity did not occur: Safety/medical concerns  Assist level: Moderate Assistance - Patient - 50 - 74% Assistive device: Hand held assist   Walk 50 feet activity   Assist Walk 50 feet with 2 turns activity did not occur: Safety/medical concerns  Assist level: Minimal Assistance - Patient > 75% Assistive device: Walker-Eva    Walk 150 feet activity   Assist Walk 150 feet activity did not occur: Safety/medical concerns  Assist level: Minimal Assistance - Patient > 75% Assistive device: Walker-Eva    Walk 10 feet on uneven surface  activity   Assist Walk 10 feet on uneven surfaces activity did not occur: Safety/medical concerns         Wheelchair     Assist Is the patient using a wheelchair?: Yes Type of Wheelchair: Manual    Wheelchair assist level: Dependent - Patient 0%      Wheelchair 50 feet with 2 turns activity    Assist        Assist Level: Dependent - Patient 0%   Wheelchair 150 feet activity     Assist      Assist Level: Dependent - Patient 0%   Blood pressure 126/76, pulse (!)  54, temperature 98 F (36.7 C), temperature source Oral, resp. rate 16, height 5\' 7"  (1.702 m), weight 89.7 kg, SpO2 96%.    Medical Problem List and Plan: 1. Functional deficits secondary to bilateral cerebellar infarct, right more than left secondary large vessel disease from severe posterior circulation stenosis             -patient may shower             -ELOS/Goals: 10-12,PT/OT sup to mod I             -Grounds pass ordered  -MRI repeat 5/17, was discussed with neurology Dr. Bonnita Buttner , not acute cva but some progression of prior strokes. No additional changes, avoid hypotension   -Consult SLP- worsened dysarthria over past few days  - 5-19: Multiple messages from therapist and nursing concerns regarding worsening dysarthria, ataxia, and right facial sensory loss.  No new focal deficits on exam, however symptoms did  not improve with medication adjustments.  Stat CT head without acute findings.  No hemorrhagic transformation or mass effect.  5/26-pt alert and appropriate today. -Continue CIR therapies including PT, OT, and SLP   Continue D3 2,000U daily 2.  Antithrombotics: -DVT/anticoagulation:  Pharmaceutical: Lovenox              -antiplatelet therapy: Aspirin  81 mg daily and Plavix  75 mg day x 3 months then Plavix  alone 3. Pain Management: Neurontin  300 mg twice daily--well-controlled 4. Mood/Behavior/Sleep: Provide emotional support             -antipsychotic agents: N/A 5. Neuropsych/cognition: This patient is capable of making decisions on his own behalf.  -5/18 start lexapro  for mood, consider neuropsych consult  5/19: DC lexapro  d/t worsening lethargy, dysarthria   6. Skin/Wound Care: Routine skin checks  5-19: Right upper extremity duplex ordered prior for swelling negative for DVT, however with multiple cystic structures concerning for abscess.  CT recommended for further assessment, order placed.  No current fevers or leukocytosis to indicate infection.  7.  Fluids/Electrolytes/Nutrition: Routine in and outs with follow-up chemistries 8.  Klebsiella oxytoca UTI.  Completing course of Duricef  - Per urology note, is to continue on cefadroxil  1 tab p.o. nightly for CAUTI prophylaxis while Foley is in place  9.  CKD stage IV.  Discussed that creatinine is improving  -Cr overalls stable 2.47/BUN 50- monitor  5-26 Cr in range of recent labs 10.  Chronic combined systolic and diastolic congestive heart failure. EF 25-30% Monitor for any signs of fluid overload.  Follow-up cardiology services.  Continue Demadex  60 mg twice daily, weight reviewed and is increased, daily weights ordered, weights are stable/increased, continue current regimen  5/17 wt trending up although suspect may be inaccurate, no signs of overload noted  5/18 Wt appears stable, monitor  5/19: weights downtrending, ? Stroke recrudescence with normotension d/t stenosis, reduce torsemide  to 60 mg daily  5/26 sl downward trend still. Was eating well this morning -continue to encourage intake   Filed Weights   09/16/23 0515 09/17/23 0455 09/18/23 0500  Weight: 90.9 kg 89.9 kg 89.7 kg     11.  Nonischemic cardiomyopathy.  Followed by cardiology service Dr. Micael Adas.  Continue Imdur  30 mg daily and Coreg  6.25 mg twice daily.  See #10.  12.  History of prostate cancer, BPH.  Followed by Dr. Freddi Jaeger.  Flomax  0.4 mg twice daily             -Check PVR/bladder scan  5/17-8 he has foley in placed, clear yellow urine  5/26 pt is emptying incontinently  13.  Diabetes mellitus with peripheral neuropathy and hemoglobin A1c 7.0.  Glucotrol  5 mg daily  -5/18 few elevated CBGs, continue to monitor trend for now  5-19: BG is generally well-controlled, monitor  CBG (last 3)  Recent Labs    09/17/23 1628 09/17/23 2100 09/18/23 0624  GLUCAP 180* 215* 100*     14. HTN. Long term goal normotensive. Home meds carvedilol , imdur , toresemide  -5/18 would like avoid hypotension, decrease Imdur  to 15 mg,  decrease Coreg  to 3.125 twice daily, decrease torsemide  to daily  5/19: Weight down, BP normotensive but may be underperfusing; reduce torsemide  as above. Can consider reducing flomax  as well pending DC foley trial but cannot stay off long term d/t chronic retention.  5/26 bp controlled Vitals:   09/17/23 1939 09/18/23 0500  BP: (!) 158/81 126/76  Pulse: 71 (!) 54  Resp: 16 16  Temp: 98.2  F (36.8 C) 98 F (36.7 C)  SpO2: 95% 96%      15. Obesity. Body mass index is 33.67 kg/m.             -dietary education  16. R wrist pain due to fall. Wearing wrist brace. Xray without fracture. Voltaren  gel scheduled             -tylenol  prn  -5/17 Will check vas US  due to R arm swelling  5-19: See above; no DVT, possible abscesses/cystic structures.    5/20: CT reviewed and shows age indeterminate fracture, will consult ortho  17. Constipation: D/c miralax , d/c [place]ot, encouraged prune juice, asked nursing to bring prune juice for him  Last bowel movement 5/21  18. Dizziness/vertigo/nystagmus: vestibular eval ordered   Decrease meclizine  to daily given lethargy.  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  Scopolamine patch added  19. Anemia: Hgb reviewed and is slightly improved  20. AMS: MRI brain and CT Head obtained without acute concerning findings, have consulted neurology to review, does appear better today  21. Drooling: scopolamine patch added, helping, continue  22. Lethargy: modafinil ordered, helping, continue  23. Penile swelling: placed order to d/c foley, improved  24. Type 7 stool: last BM 5/26, continue metamucil  25, Bradycardia: continue to monitor TID     LOS: 13 days A FACE TO FACE EVALUATION WAS PERFORMED  Lavell Portugal P Johannah Rozas 09/18/2023, 11:29 AM

## 2023-09-18 NOTE — Progress Notes (Signed)
 Speech Language Pathology Weekly Progress and Session Note  Patient Details  Name: Joseangel Nettleton MRN: [id number] Date of Birth: 1935-06-26  Beginning of progress report period: Sep 11, 2023 End of progress report period: Sep 18, 2023  Today's Date: 09/18/2023 SLP Individual Time: 4098-1191 SLP Individual Time Calculation (min): 43 min  Short Term Goals: Week 1: SLP Short Term Goal 1 (Week 1): Patient will complete MBS to determine least restrictive safe diet. SLP Short Term Goal 1 - Progress (Week 1): Met SLP Short Term Goal 2 (Week 1): Patient will orient to date utilizing external aids when provided with mod verbal cues. SLP Short Term Goal 2 - Progress (Week 1): Met SLP Short Term Goal 3 (Week 1): Patient will selectively attend to speech therapy tasks for 10 minute intervals given mod assist. SLP Short Term Goal 3 - Progress (Week 1): Met SLP Short Term Goal 4 (Week 1): Patient will recall pertinent daily information with 80% accuracy utilizing external/internal aids when given mod assist. SLP Short Term Goal 4 - Progress (Week 1): Met SLP Short Term Goal 5 (Week 1): Patient will solve basic environmental problems with 80% accuracy given mod assist. SLP Short Term Goal 5 - Progress (Week 1): Met SLP Short Term Goal 6 (Week 1): Patient will utilize speech intelligibility and communication repair strategies in 8/10 opportunities at the phrase level given mod assist. SLP Short Term Goal 6 - Progress (Week 1): Met    New Short Term Goals: Week 2: SLP Short Term Goal 1 (Week 2): Patient will tolerate least restrictive diet across a meal with no more than 1 overt clinical instance of suspected aspiration given min cues for use of swallow compensatory strategies. SLP Short Term Goal 2 (Week 2): Patient will orient to date utilizing external aids when provided with min verbal cues. SLP Short Term Goal 3 (Week 2): Patient will selectively attend to speech therapy tasks for 10 minute intervals  given min assist. SLP Short Term Goal 4 (Week 2): Patient will recall pertinent daily information with 80% accuracy utilizing external/internal aids when given min assist. SLP Short Term Goal 5 (Week 2): Patient will solve basic environmental problems with 80% accuracy given min assist. SLP Short Term Goal 6 (Week 2): Patient will utilize speech intelligibility and communication repair strategies in 8/10 opportunities at the phrase level given min assist.  Weekly Progress Updates: Patient has made progress across therapy stay, meeting 6/6 short term goals set for admission. This reporting period, SLP conducted MBS to determine patient's least restrictive safe diet, and patient is now consistently tolerating Dys3/thin liquid diet with liquids administered in 10cc provale cup and medications administered whole in puree. Cognitively, patient is improving and benefits from mod assist at most for basic orientation, problem solving, and memory of daily events, though cognitive tasks have not been heavily targeted due to acuity of need for dysphagia and dysarthria therapy. Patient utilizes SLOP speech intelligibility strategies and benefits from mod cues at the phrase level. Patient and family education ongoing. Patient will continue to benefit from skilled therapy services during remainder of CIR stay.     Intensity: Minumum of 1-2 x/day, 30 to 90 minutes Frequency: 3 to 5 out of 7 days Duration/Length of Stay: 12-14 Treatment/Interventions: Cognitive remediation/compensation;Speech/Language facilitation;Functional tasks;Cueing hierarchy;Therapeutic Activities;Internal/external aids;Patient/family education;Dysphagia/aspiration precaution training   Daily Session  Skilled Therapeutic Interventions: SLP conducted skilled therapy session targeting dysphagia and communication goals. Upon SLP entry, patient reclined in bed but awake and agreeable to all therapy tasks.  SLP provided skilled assessment of diet  tolerance with breakfast tray. Patient consumed various textures as included in Dys3/thin liquid tray with all liquids administered via Provale cup. Patient demonstrated singular instance of immediate cough throughout with thin liquids, however suspect ataxic movements during self-feeding resulted in self-presentation of liquids in a less predictable manner which made it difficult for patient to adequately prep for the swallow. No other overt s/sx of penetration/aspiration observed. Recommend continuation of current diet. Re: speech intelligibility strategies, patient reports that he "really likes" the strategies and incorporates them into his speech throughout the day. Patient continues to benefit from queries to repeat himself as intelligibility uncued remains reduced. Once cued, intelligibility increases to ~80% at the word/phrase level. Patient was left in room with call bell in reach and alarm set. SLP will continue to target goals per plan of care.       Pain Pain Assessment Pain Scale: 0-10 Pain Score: 0-No pain  Therapy/Group: Individual Therapy  Dorla Gartner, M.A., CCC-SLP  Caeleb Batalla A Hedda Crumbley 09/18/2023, 8:15 AM

## 2023-09-18 NOTE — Progress Notes (Signed)
 Nurse put home cpap on pt at 10:15 after medication was given and patient drank some tea.

## 2023-09-19 ENCOUNTER — Inpatient Hospital Stay (HOSPITAL_COMMUNITY)

## 2023-09-19 LAB — GLUCOSE, CAPILLARY
Glucose-Capillary: 124 mg/dL — ABNORMAL HIGH (ref 70–99)
Glucose-Capillary: 144 mg/dL — ABNORMAL HIGH (ref 70–99)
Glucose-Capillary: 172 mg/dL — ABNORMAL HIGH (ref 70–99)
Glucose-Capillary: 102 mg/dL — ABNORMAL HIGH (ref 70–99)

## 2023-09-19 MED ORDER — TORSEMIDE 20 MG PO TABS
30.0000 mg | ORAL_TABLET | Freq: Every day | ORAL | Status: DC
  Administered 2023-09-20 – 2023-09-23 (×4): 30 mg via ORAL
  Filled 2023-09-19 (×4): qty 2

## 2023-09-19 NOTE — Progress Notes (Signed)
 Physical Therapy Session Note  Patient Details  Name: Allen Dyer MRN: [id number] Date of Birth: Mar 01, 1936  Today's Date: 09/19/2023 PT Individual Time: 0800-0900 PT Individual Time Calculation (min): 60 min   Short Term Goals: Week 2:  PT Short Term Goal 1 (Week 2): Pt will complete bed<>chair transfers with maxA PT Short Term Goal 2 (Week 2): Pt will propel wheelchair ~66ft with modA  Skilled Therapeutic Interventions/Progress Updates:      Pt in bed to star t- awake and no reports of pain. Pt's breakfast untouched at bedside and pt requests to eat his meal before leaving his room. Donned pants at bed level with totalA (pt <25%) while rolling in bed to help pull pants over hips. Pt requires minA for rolling to his R and modA for rolling to his L.   Supine<>sitting EOB with bed flat, needing mod/maxA for trunk support and BLE management. Requires maxA for forward scooting to achieve feet flat at EOB. Pt eat's his breakfast while sitting EOB. He initially can sit with supervision for the first ~3-4 minutes but fades to requiring intermittent minA due to fatigue and posterior lean. Pt aware of LOB but difficulty correcting with delayed righting noted. PT applying deep pressure to upper traps while he sat EOB to help with his truncal ataxia. Patient needing cues for slower and small bites as he ate. Cut his bannana into pieces to help with feeding. Also required assist for opening containers and packages due to dexterity impairments. Pt ate around ~50% of his meal. Pt completed SB transfer with mod/maxA from EOB into wheelchair with cues for forward weight shifting and hand placement to assist with transfer. He was left sitting up in wheelchair with seat belt alarm on and needs met.   Therapy Documentation Precautions:  Precautions Precautions: Fall, Other (comment) (DNR) Precaution/Restrictions Comments: Urinary incontinence, DNR Required Braces or Orthoses: Splint/Cast Splint/Cast: R  wrist cock up splint Restrictions Weight Bearing Restrictions Per Provider Order: No RUE Weight Bearing Per Provider Order: Weight bearing as tolerated Other Position/Activity Restrictions: as tolerates General:     Therapy/Group: Individual Therapy  Tonae Livolsi P Xiamara Hulet 09/19/2023, 7:45 AM

## 2023-09-19 NOTE — Progress Notes (Addendum)
 Occupational Therapy Session Note  Patient Details  Name: Allen Dyer MRN: [id number] Date of Birth: January 08, 1936  Today's Date: 09/19/2023 OT Individual Time: 1355-1510 OT Individual Time Calculation (min): 75 min    Short Term Goals: Week 2:  OT Short Term Goal 1 (Week 2): Patient will assist with lower body dressing in bed by bridging/ half bridging OT Short Term Goal 2 (Week 2): Patient will safely sit at edge of bed with min assist when preparing for a trasnfer to wheelchair/recliner OT Short Term Goal 3 (Week 2): Patient will safely statically sit x 3 min in preparation for toileting activities OT Short Term Goal 4 (Week 2): Patient will complete hygiene at sink with set up assist OT Short Term Goal 5 (Week 2): Patient will feed himself and drink from a cup with intermittent min assist  Skilled Therapeutic Interventions/Progress Updates:   Patient received supine in bed with daughters at bedside.  Patient wide awake and alert.  Patient agreeable to get out of bed.    Assisted patient to roll toward right, then sit at edge of bed with moderate assistance.  This is a significant improvement over late last week.  Patient needed assistance to scoot forward to get feet on floor.  Patient taken to sink to complete oral care.  In attempting to rinse mouth, patient spilled water on clothing.  Assisted patient to don a dry pull over shirt. Truncal ataxia less pronounced this session, and resolving more quickly - able to identify some trends.  More ataxic with rightward weight shifts in sitting, especially in open chain conditions.  Patient does best with slow, intentional movements in any direction, and needs at least three limbs in contact with surface for stability whenever possible.  Patient able to achieve lift off of surface, full stand with mod assist.  Transitioning from full stand to sit was challenging this session, as patient had difficulty releasing knee/hip extension for controlled knee  hip / trunk flexion to sit down.  Patient returned to room at end of session and transferred into his recliner with max assist - stand step.  Safety belt in place and engaged and call bell and personal items in reach.     Therapy Documentation Precautions:  Precautions Precautions: Fall, Other (comment) (DNR) Precaution/Restrictions Comments: Urinary incontinence, DNR Required Braces or Orthoses: Splint/Cast Splint/Cast: R wrist cock up splint Restrictions Weight Bearing Restrictions Per Provider Order: No RUE Weight Bearing Per Provider Order: Weight bearing as tolerated Other Position/Activity Restrictions: as tolerates  Pain:  Denies pain     Therapy/Group: Individual Therapy  Agapito Hanway M 09/19/2023, 3:24 PM

## 2023-09-19 NOTE — Patient Care Conference (Signed)
 Inpatient RehabilitationTeam Conference and Plan of Care Update Date: 09/19/2023   Time: 11:08 AM    Patient Name: Allen Dyer      Medical Record Number: [id number]  Date of Birth: 1935/12/09 Sex: Male         Room/Bed: 4M02C/4M02C-01 Payor Info: Payor: MEDICARE / Plan: MEDICARE PART A AND B / Product Type: *No Product type* /    Admit Date/Time:  09/05/2023  6:19 PM  Primary Diagnosis:  Ischemic cerebrovascular accident (CVA) [facility])  Hospital Problems: Principal Problem:   Ischemic cerebrovascular accident (CVA) (HCC) Active Problems:   Unspecified symptoms and signs involving cognitive functions following cerebral infarction    Expected Discharge Date: Expected Discharge Date: 09/27/23  Team Members Present: Physician leading conference: Dr. Laverle Postin Social Worker Present: Adrianna Albee, LCSW Nurse Present: Forrestine Ike, RN PT Present: Oma Bias, PT OT Present: Celestino Cole, OT SLP Present: Dorla Gartner, SLP     Current Status/Progress Goal Weekly Team Focus  Bowel/Bladder   Incontinent episodes of bowel and bladder. LBm 5/27.   PT remains free from S/S of constipation   Offer toileting q 2 hours during day and q4 at hs. Provide medication per order.    Swallow/Nutrition/ Hydration   Dys3/thin liquids, 10cc provale cup   min assist  diet upgrade, use of strategies with supervision    ADL's   max/mod assist BADL - improved from last week   Mod assist - goals downgraded   Reduce ataxia, improve safety with functional mobility and adl, family education/ dischareg planning    Mobility   maxA bed mobility, maxA SB transfers, min/modA sit<>stands with BUE support to pull to stand, significant ataxia limiting   CGA - will likely downgrade to modA  postural control, ataxia management, functional transfers, bed mobility    Communication   ~50% intelligibile at the conversation level, utilizes speech intelligibility strategies with mod assist at the  phrase level.   min assist   continued utilization of speech intelligibility strategies, improve consistency.    Safety/Cognition/ Behavioral Observations  benefits from min to mod cues for verbal problem solving for working memory and processing speed, cognition not heavily targeted this reporting period due more pressing need to target Dysphagia and dysarthria.   min   problem solving, memory, attention, awareness, orientation    Pain   Denies pain this shift.   Pt states pain <3 out of 10.   Assess pain q shift and PRN. Administer medication per order.    Skin   abrasion to L shin, foam dressing   Remain free from S/S of skin breakdown  assess skin q shift and PRN. wound care per orders      Discharge Planning:  Daughter palns to take home and is very committed to Dad. Pt has done better this week and has shown progress from the extension he has had with the CVA   Team Discussion: Patient post ischemic CVA with visual deficits; diplopia, blurred vision, truncal ataxia and urinary retention.  Patient on target to meet rehab goals: yes, currently needsmod- max assist for bed mobility and squat pivot/slide board transfers.  Needs mod - max assist for ADLs due to ataxia. Goals for discharge set for mod assist overall. Will not be ambulating at discharge; w/c level mobility.  *See Care Plan and progress notes for long and short-term goals.   Revisions to Treatment Plan:  Palliative Care Consult CT scan for visual deficits Modafinil ongoing Foley removed Eva walker trial EMST/Provale cup  Teaching Needs: Safety, medications, transfers, toileting, dietary modifications, etc.   Current Barriers to Discharge: Decreased caregiver support and Home enviroment access/layout  Possible Resolutions to Barriers: Family education     Medical Summary Current Status: type 2 diabetes, fatigue, fluctuating hypo and hypertension, CHF  Barriers to Discharge: Medical stability   Barriers to Discharge Comments: type 2 diabetes, fatigue/daytime somnolence, fluctuating hypo and hypertension, CHF Possible Resolutions to [employer] Focus: semglee added, continue ISS, continue modafinil, continue to monitor BP TID, torsemide  decreased to 30mg , continue daily weights   Continued Need for Acute Rehabilitation Level of Care: The patient requires daily medical management by a physician with specialized training in physical medicine and rehabilitation for the following reasons: Direction of a multidisciplinary physical rehabilitation program to maximize functional independence : Yes Medical management of patient stability for increased activity during participation in an intensive rehabilitation regime.: Yes Analysis of laboratory values and/or radiology reports with any subsequent need for medication adjustment and/or medical intervention. : Yes   I attest that I was present, lead the team conference, and concur with the assessment and plan of the team.   Forrestine Ike B 09/19/2023, 2:45 PM

## 2023-09-19 NOTE — Plan of Care (Signed)
  Problem: Consults Goal: RH STROKE PATIENT EDUCATION Description: See Patient Education module for education specifics  Outcome: Progressing   Problem: RH BOWEL ELIMINATION Goal: RH STG MANAGE BOWEL WITH ASSISTANCE Description: STG Manage Bowel with supervision Assistance. Outcome: Progressing   Problem: RH BOWEL ELIMINATION Goal: RH STG MANAGE BOWEL W/MEDICATION W/ASSISTANCE Description: STG Manage Bowel with Medication with Assistance. Outcome: Progressing   Problem: RH BLADDER ELIMINATION Goal: RH STG MANAGE BLADDER WITH ASSISTANCE Description: STG Manage Bladder With supervision Assistance Outcome: Progressing   Problem: RH SKIN INTEGRITY Goal: RH STG SKIN FREE OF INFECTION/BREAKDOWN Description: Manage skin free of infection/breakdown with supervision assistance Outcome: Progressing   Problem: RH KNOWLEDGE DEFICIT Goal: RH STG INCREASE KNOWLEGDE OF HYPERLIPIDEMIA Description: Manage knowledge of hyperlipidemia with supervision assistance from daughter using educational materials provided Outcome: Progressing   Problem: RH KNOWLEDGE DEFICIT Goal: RH STG INCREASE KNOWLEDGE OF DIABETES Description: Manage knowledge of diabetes with supervision assistance from daughter using educational materials provided Outcome: Progressing

## 2023-09-19 NOTE — Progress Notes (Signed)
 Physical Therapy Session Note  Patient Details  Name: Allen Dyer MRN: [id number] Date of Birth: 15-Sep-1935  Today's Date: 09/19/2023 PT Individual Time: 1334-1400 PT Individual Time Calculation (min): 26 min   Short Term Goals: Week 1:  PT Short Term Goal 1 (Week 1): Pt will complete bed mobility with minA PT Short Term Goal 1 - Progress (Week 1): Not met PT Short Term Goal 2 (Week 1): Pt will complete bed<>chair transfers with minA PT Short Term Goal 2 - Progress (Week 1): Not met PT Short Term Goal 3 (Week 1): pt will ambulate 12ft with minA and LRAD PT Short Term Goal 3 - Progress (Week 1): Not met PT Short Term Goal 4 (Week 1): Pt will navigate up/down x4 stairs with modA PT Short Term Goal 4 - Progress (Week 1): Not met PT Short Term Goal 5 (Week 1): Pt will complete bed mobility with modA Week 2:  PT Short Term Goal 1 (Week 2): Pt will complete bed<>chair transfers with maxA PT Short Term Goal 2 (Week 2): Pt will propel wheelchair ~74ft with modA  Skilled Therapeutic Interventions/Progress Updates:  Patient seated upright in w/c with dtr present on entrance to room. Patient alert and agreeable to PT session. Requests to return to bed.   Patient with no pain complaint at start of session.  RN and NT enter with request from nephrology for bladder scan. Pt then relates that brief is wet and will require new brief.   Using STEDY, pt able to transfer to stance with MinA. Sits to EOB with CGA and able to initiate move to sidelying with vc/ tc and MinA for UB with ModA for BLE. Ataxia noted throughout. Pt assisted with roll to each side which he initiates with CGA. Pericare  and donning of new brief performed with MaxA. Pt guided into sidelying to R side and push up to seated position requiring ModA for decreased strength and increased ataxia with mobility. While seate EOB, ModA to bring both feet to supported position on floor which decreases trunkal ataxia. Guided pt in deep  abdominal breath to relieve ataxia and produce still upright seated balance. STEDY placed in front of pt and places Bil feet on STEDY with supervision. Guided into stance with CGA. While standing, continued to guide pt in abdominal breathing to reduce anxiety and ataxia in trunk. Pt fully standing in STEDY and downward pressure applied to pelvis for improved decrease in errant LE ataxia which reduces trunkal movements. With any increase in ataxia, pt guided again in abdominal breathing as well as provided with downward pressure either at shoulders or at pelvis with good results.   Seated to w/c with CGA using STEDY. Patient seated upright at end of session with brakes locked, belt alarm set, and all needs within reach.   Therapy Documentation Precautions:  Precautions Precautions: Fall, Other (comment) (DNR) Precaution/Restrictions Comments: Urinary incontinence, DNR Required Braces or Orthoses: Splint/Cast Splint/Cast: R wrist cock up splint Restrictions Weight Bearing Restrictions Per Provider Order: No RUE Weight Bearing Per Provider Order: Weight bearing as tolerated Other Position/Activity Restrictions: as tolerates  Pain:  No pain related this session.   Therapy/Group: Individual Therapy  Donne Gage PT, DPT, CSRS 09/18/2023, 5:57 PM

## 2023-09-19 NOTE — Progress Notes (Signed)
     Subjective: Went to go see [name] again today and reviewed the results of his bladder scans.  He understands there is no evidence of urinary retention but will share with his nurses if anything changes.  Objective: Vital signs in last 24 hours: Temp:  [97.9 F (36.6 C)-98 F (36.7 C)] 98 F (36.7 C) (05/28 0432) Pulse Rate:  [56-63] 56 (05/28 0432) Resp:  [16-20] 16 (05/28 0432) BP: (119-143)/(59-74) 119/59 (05/28 0432) SpO2:  [95 %-96 %] 95 % (05/28 0432) FiO2 (%):  [21 %] 21 % (05/27 2355) Weight:  [88.9 kg] 88.9 kg (05/28 0432)  Assessment/Plan: #urinary retnetion #bulbar stricture # CKD   Patient's serum creatinine has been trending below his baseline for the past few days.  Continue to follow as needed  Dilated by Dr. Derrick Fling at bedside 5/15. Foley removed 2 days ago per family (5/25). They expressed concern for ongoing retention. Most recent PVR was 11mL per nursing.   Follow up with [facility] for ongoing surveillance.  Urology will sign off at this time.  Please call with questions, concerns, or acute changes.  Intake/Output from previous day: 05/27 0701 - 05/28 0700 In: 478 [P.O.:478] Out: -   Intake/Output this shift: No intake/output data recorded.  Physical Exam:  General: Alert and oriented CV: No cyanosis Lungs: equal chest rise   Lab Results: Recent Labs    09/17/23 0620  HGB 11.0*  HCT 34.1*   BMET Recent Labs    09/17/23 0620  NA 141  K 3.7  CL 106  CO2 28  GLUCOSE 96  BUN 35*  CREATININE 2.16*  CALCIUM  9.9  HGB 11.0*  WBC 4.5     Studies/Results: CT HEAD WO CONTRAST ( ) Result Date: 09/17/2023 CLINICAL DATA:  Stroke follow-up EXAM: CT HEAD WITHOUT CONTRAST TECHNIQUE: Contiguous axial images were obtained from the base of the skull through the vertex without intravenous contrast. RADIATION DOSE REDUCTION: This exam was performed according to the departmental dose-optimization program which includes automated  exposure control, adjustment of the mA and/or kV according to patient size and/or use of iterative reconstruction technique. COMPARISON:  09/11/2023 FINDINGS: Brain: Unchanged hypoattenuation within the middle cerebellar peduncles. 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 No mass, hemorrhage or extra-axial collection. Vascular: Atherosclerotic calcification of the internal carotid arteries at the skull base. No abnormal hyperdensity of the major intracranial arteries or dural venous sinuses. Skull: The visualized skull base, calvarium and extracranial soft tissues are normal. Sinuses/Orbits: No fluid levels or advanced mucosal thickening of the visualized paranasal sinuses. No mastoid or middle ear effusion. Normal orbits. Other: None. IMPRESSION: 1. Unchanged hypoattenuation within the middle cerebellar peduncles, corresponding to areas of diffusion restriction previously seen. 2. 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 Electronically Signed   By: Juanetta Nordmann M.D.   On: 09/17/2023 20:00      LOS: 14 days   Alla Ar, NP [facility] Pager: [phone]  09/19/2023, 9:22 AM

## 2023-09-19 NOTE — Progress Notes (Signed)
 PROGRESS NOTE   Subjective/Complaints: Patient's chart reviewed- No issues reported overnight Vitals signs stable except for fluctuating blood pressures and bradycardia  ROS: Patient denies fever, rash, sore throat, blurred vision, dizziness, nausea, vomiting, diarrhea, cough, shortness of breath or chest pain, joint or back/neck pain, headache, or mood change. Drooling improved  Objective:   CT HEAD WO CONTRAST ( ) Result Date: 09/17/2023 CLINICAL DATA:  Stroke follow-up EXAM: CT HEAD WITHOUT CONTRAST TECHNIQUE: Contiguous axial images were obtained from the base of the skull through the vertex without intravenous contrast. RADIATION DOSE REDUCTION: This exam was performed according to the departmental dose-optimization program which includes automated exposure control, adjustment of the mA and/or kV according to patient size and/or use of iterative reconstruction technique. COMPARISON:  09/11/2023 FINDINGS: Brain: Unchanged hypoattenuation within the middle cerebellar peduncles. 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 No mass, hemorrhage or extra-axial collection. Vascular: Atherosclerotic calcification of the internal carotid arteries at the skull base. No abnormal hyperdensity of the major intracranial arteries or dural venous sinuses. Skull: The visualized skull base, calvarium and extracranial soft tissues are normal. Sinuses/Orbits: No fluid levels or advanced mucosal thickening of the visualized paranasal sinuses. No mastoid or middle ear effusion. Normal orbits. Other: None. IMPRESSION: 1. Unchanged hypoattenuation within the middle cerebellar peduncles, corresponding to areas of diffusion restriction previously seen. 2. 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 Electronically Signed   By: Juanetta Nordmann M.D.   On: 09/17/2023 20:00     Recent Labs    09/17/23 0620  WBC 4.5  HGB 11.0*  HCT 34.1*  PLT 320    Recent  Labs    09/17/23 0620  NA 141  K 3.7  CL 106  CO2 28  GLUCOSE 96  BUN 35*  CREATININE 2.16*  CALCIUM  9.9     Intake/Output Summary (Last 24 hours) at 09/19/2023 0908 Last data filed at 09/18/2023 1920 Gross per 24 hour  Intake 358 ml  Output --  Net 358 ml        Physical Exam: Vital Signs Blood pressure (!) 119/59, pulse (!) 56, temperature 98 F (36.7 C), resp. rate 16, height 5\' 7"  (1.702 m), weight 88.9 kg, SpO2 95%.  Constitutional: No distress . Vital signs reviewed. HEENT: NCAT, EOMI, oral membranes moist, visual deficits noted Neck: supple Cardiovascular: Bradycardia Respiratory/Chest: CTA Bilaterally without wheezes or rales. Normal effort    GI/Abdomen: BS +, non-tender, non-distended Ext: no clubbing, cyanosis, or edema Psych: pleasant and cooperative    Skin: intact; mild right upper extremity swelling/edema  Neuro:very dysarthric ataxic speech, improved conversation and awareness. Moves all 4's but does demonstrate ataxia   No tone or spasticity noted.  Cranial nerves grossly intact.   MSK: R  arm remains in splint, stable 5/28        Assessment/Plan: 1. Functional deficits which require 3+ hours per day of interdisciplinary therapy in a comprehensive inpatient rehab setting. Physiatrist is providing close team supervision and 24 hour management of active medical problems listed below. Physiatrist and rehab team continue to assess barriers to discharge/monitor patient progress toward functional and medical goals  Care Tool:  Bathing    Body parts bathed by patient: Right  arm, Chest, Abdomen, Face   Body parts bathed by helper: Right arm, Left arm, Chest, Abdomen, Front perineal area, Buttocks, Right upper leg, Left upper leg, Right lower leg, Left lower leg, Face (shower)     Bathing assist Assist Level: Total Assistance - Patient < 25%     Upper Body Dressing/Undressing Upper body dressing   What is the patient wearing?: Pull over  shirt    Upper body assist Assist Level: Maximal Assistance - Patient 25 - 49%    Lower Body Dressing/Undressing Lower body dressing      What is the patient wearing?: Pants, Incontinence brief     Lower body assist Assist for lower body dressing: Total Assistance - Patient < 25%     Toileting Toileting Toileting Activity did not occur (Clothing management and hygiene only): N/A (no void or bm)  Toileting assist Assist for toileting: Total Assistance - Patient < 25%     Transfers Chair/bed transfer  Transfers assist     Chair/bed transfer assist level: Total Assistance - Patient < 25%     Locomotion Ambulation   Ambulation assist   Ambulation activity did not occur: Safety/medical concerns  Assist level: Moderate Assistance - Patient 50 - 74% Assistive device: Hand held assist Max distance: 89ft   Walk 10 feet activity   Assist  Walk 10 feet activity did not occur: Safety/medical concerns  Assist level: Moderate Assistance - Patient - 50 - 74% Assistive device: Hand held assist   Walk 50 feet activity   Assist Walk 50 feet with 2 turns activity did not occur: Safety/medical concerns  Assist level: Minimal Assistance - Patient > 75% Assistive device: Walker-Eva    Walk 150 feet activity   Assist Walk 150 feet activity did not occur: Safety/medical concerns  Assist level: Minimal Assistance - Patient > 75% Assistive device: Walker-Eva    Walk 10 feet on uneven surface  activity   Assist Walk 10 feet on uneven surfaces activity did not occur: Safety/medical concerns         Wheelchair     Assist Is the patient using a wheelchair?: Yes Type of Wheelchair: Manual    Wheelchair assist level: Dependent - Patient 0%      Wheelchair 50 feet with 2 turns activity    Assist        Assist Level: Dependent - Patient 0%   Wheelchair 150 feet activity     Assist      Assist Level: Dependent - Patient 0%   Blood  pressure (!) 119/59, pulse (!) 56, temperature 98 F (36.7 C), resp. rate 16, height 5\' 7"  (1.702 m), weight 88.9 kg, SpO2 95%.    Medical Problem List and Plan: 1. Functional deficits secondary to bilateral cerebellar infarct, right more than left secondary large vessel disease from severe posterior circulation stenosis             -patient may shower             -ELOS/Goals: 10-12,PT/OT sup to mod I             -Grounds pass ordered  -MRI repeat 5/17, was discussed with neurology Dr. Bonnita Buttner , not acute cva but some progression of prior strokes. No additional changes, avoid hypotension   -Consult SLP- worsened dysarthria over past few days  - 5-19: Multiple messages from therapist and nursing concerns regarding worsening dysarthria, ataxia, and right facial sensory loss.  No new focal deficits on exam, however symptoms did not  improve with medication adjustments.  Stat CT head without acute findings.  No hemorrhagic transformation or mass effect.  5/26-pt alert and appropriate today. -Continue CIR therapies including PT, OT, and SLP   Continue D3 2,000U daily 2.  Antithrombotics: -DVT/anticoagulation:  Pharmaceutical: Lovenox              -antiplatelet therapy: Aspirin  81 mg daily and Plavix  75 mg day x 3 months then Plavix  alone 3. Pain Management: Neurontin  300 mg twice daily--well-controlled 4. Mood/Behavior/Sleep: Provide emotional support             -antipsychotic agents: N/A 5. Neuropsych/cognition: This patient is capable of making decisions on his own behalf.  -5/18 start lexapro  for mood, consider neuropsych consult  5/19: DC lexapro  d/t worsening lethargy, dysarthria   6. Skin/Wound Care: Routine skin checks  5-19: Right upper extremity duplex ordered prior for swelling negative for DVT, however with multiple cystic structures concerning for abscess.  CT recommended for further assessment, order placed.  No current fevers or leukocytosis to indicate infection.  7.  Fluids/Electrolytes/Nutrition: Routine in and outs with follow-up chemistries 8.  Klebsiella oxytoca UTI.  Completing course of Duricef  - Per urology note, is to continue on cefadroxil  1 tab p.o. nightly for CAUTI prophylaxis while Foley is in place  9.  CKD stage IV.  Discussed that creatinine is improving  -Cr overalls stable 2.47/BUN 50- monitor  5-26 Cr in range of recent labs 10.  Chronic combined systolic and diastolic congestive heart failure. EF 25-30% Monitor for any signs of fluid overload.  Follow-up cardiology services.  Continue Demadex  60 mg twice daily, weight reviewed and is increased, daily weights ordered, weights are stable/increased, continue current regimen  5/17 wt trending up although suspect may be inaccurate, no signs of overload noted  5/18 Wt appears stable, monitor  5/19: weights downtrending, ? Stroke recrudescence with normotension d/t stenosis, reduce torsemide  to 60 mg daily  5/26 sl downward trend still. Was eating well this morning -continue to encourage intake   Filed Weights   09/17/23 0455 09/18/23 0500 09/19/23 0432  Weight: 89.9 kg 89.7 kg 88.9 kg     11.  Nonischemic cardiomyopathy.  Followed by cardiology service Dr. Micael Adas.  Continue Imdur  30 mg daily and Coreg  6.25 mg twice daily.  See #10.  12.  History of prostate cancer, BPH.  Followed by Dr. Freddi Jaeger.  Flomax  0.4 mg twice daily             -Check PVR/bladder scan  5/17-8 he has foley in placed, clear yellow urine  5/26 pt is emptying incontinently  13.  Diabetes mellitus with peripheral neuropathy and hemoglobin A1c 7.0.  Glucotrol  5 mg daily  -5/18 few elevated CBGs, continue to monitor trend for now  5-19: BG is generally well-controlled, monitor  CBG (last 3)  Recent Labs    09/18/23 1707 09/18/23 2119 09/19/23 0639  GLUCAP 151* 168* 124*     14. HTN. Long term goal normotensive. Home meds carvedilol , imdur , toresemide  -5/18 would like avoid hypotension, decrease Imdur  to 15 mg,  decrease Coreg  to 3.125 twice daily, decrease torsemide  to daily  5/19: Weight down, BP normotensive but may be underperfusing; reduce torsemide  as above. Can consider reducing flomax  as well pending DC foley trial but cannot stay off long term d/t chronic retention.  5/26 bp controlled Vitals:   09/18/23 1900 09/19/23 0432  BP: (!) 143/74 (!) 119/59  Pulse: 63 (!) 56  Resp: 20 16  Temp: 98  F (36.7 C) 98 F (36.7 C)  SpO2: 95% 95%      15. Obesity. Body mass index is 33.67 kg/m.             -dietary education  16. R wrist pain due to fall. Wearing wrist brace. Xray without fracture. Voltaren  gel scheduled             -tylenol  prn  -5/17 Will check vas US  due to R arm swelling  5-19: See above; no DVT, possible abscesses/cystic structures.    5/20: CT reviewed and shows age indeterminate fracture, will consult ortho  17. Constipation: D/c miralax , d/c [place], encouraged prune juice, asked nursing to bring prune juice for him  Last bowel movement 5/21  18. Dizziness/vertigo/nystagmus: vestibular eval ordered   Decrease meclizine  to daily given lethargy.  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  Scopolamine patch added  19. Anemia: Hgb reviewed and is slightly improved  20. AMS: MRI brain and CT Head obtained without acute concerning findings, have consulted neurology to review, does appear better today  21. Drooling: scopolamine patch added, helping, continue  22. Lethargy: modafinil ordered, helping, continue  23. Penile swelling: placed order to d/c foley, improved  24. Type 7 stool: last BM 5/26, continue metamucil  25, Bradycardia: continue to monitor TID  26. Hypotension: weight reviewed and has decreased to 195kg, decrease torsemide  to 30mg   27. New visual deficits: CT head ordered     LOS: 14 days A FACE TO FACE EVALUATION WAS PERFORMED  Allen Dyer P Mar Walmer 09/19/2023, 9:08 AM

## 2023-09-19 NOTE — Progress Notes (Signed)
 Speech Language Pathology Daily Session Note  Patient Details  Name: Allen Dyer MRN: [id number] Date of Birth: Jun 17, 1935  Today's Date: 09/19/2023 SLP Individual Time: 0922-1012 SLP Individual Time Calculation (min): 50 min  Short Term Goals: Week 2: SLP Short Term Goal 1 (Week 2): Patient will tolerate least restrictive diet across a meal with no more than 1 overt clinical instance of suspected aspiration given min cues for use of swallow compensatory strategies. SLP Short Term Goal 2 (Week 2): Patient will orient to date utilizing external aids when provided with min verbal cues. SLP Short Term Goal 3 (Week 2): Patient will selectively attend to speech therapy tasks for 10 minute intervals given min assist. SLP Short Term Goal 4 (Week 2): Patient will recall pertinent daily information with 80% accuracy utilizing external/internal aids when given min assist. SLP Short Term Goal 5 (Week 2): Patient will solve basic environmental problems with 80% accuracy given min assist. SLP Short Term Goal 6 (Week 2): Patient will utilize speech intelligibility and communication repair strategies in 8/10 opportunities at the phrase level given min assist.  Skilled Therapeutic Interventions: SLP conducted skilled therapy session targeting dysphagia, cognition, and communication goals. Notably, patient exhibits increase in drooling despite scopolamine patch in place and reduced lip seal capacity as evidenced during EMST trials. Patient unable to complete EMST due to inadequate lip seal, whereas patient was able to complete this exercise at previous targeted session. Additionally, patient reports changes in vision that were subsequently reported to nursing by SLP. During session, patient took sips of thin liquids from [employer] cup with 1 instance of immediate cough; no other overt s/sx of penetration/aspiration were appreciated throughout. Patient recalled speech intelligibility strategies with min assist and  implemented at the phrase level with min assist for consistency. Patient reports that articulators feel 'slow' and 'tight', thus SLP provided base of tongue massage during speech, facilitated speech with greater ease per patient report. During basic time-based verbally presented problem solving tasks, patient benefited from mod assist to answer accurately. At end of session, RN reports that MD ordered CT scan due to patient's endorsement of vision changes, thus missed 10 minutes due to CT transit. Patient was left in room with call bell in reach and alarm set. SLP will continue to target goals per plan of care.        Pain Pain Assessment Pain Scale: 0-10 Pain Score: 0-No pain  Therapy/Group: Individual Therapy  Acxel Dingee, M.A., CCC-SLP  Marlet Korte A Jlynn Ly 09/19/2023, 10:14 AM

## 2023-09-19 NOTE — Progress Notes (Signed)
     Subjective: First time meeting pt. He was accompanied by his daughter  Objective: Vital signs in last 24 hours: Temp:  [97.9 F (36.6 C)-98 F (36.7 C)] 98 F (36.7 C) (05/28 0432) Pulse Rate:  [56-63] 56 (05/28 0432) Resp:  [16-20] 16 (05/28 0432) BP: (119-143)/(59-74) 119/59 (05/28 0432) SpO2:  [95 %-96 %] 95 % (05/28 0432) FiO2 (%):  [21 %] 21 % (05/27 2355) Weight:  [88.9 kg] 88.9 kg (05/28 0432)  Assessment/Plan: #urinary retnetion #bulbar stricture  Dilated by Dr. Derrick Fling at bedside 5/15. Foley removed 2 days ago per family (5/25). They expressed concern for ongoing retention. Most recent PVR was 11mL per nursing. Follow up with [facility] for ongoing surveillance.   Intake/Output from previous day: 05/27 0701 - 05/28 0700 In: 478 [P.O.:478] Out: -   Intake/Output this shift: No intake/output data recorded.  Physical Exam:  General: Alert and oriented CV: No cyanosis Lungs: equal chest rise   Lab Results: Recent Labs    09/17/23 0620  HGB 11.0*  HCT 34.1*   BMET Recent Labs    09/17/23 0620  NA 141  K 3.7  CL 106  CO2 28  GLUCOSE 96  BUN 35*  CREATININE 2.16*  CALCIUM  9.9  HGB 11.0*  WBC 4.5     Studies/Results: CT HEAD WO CONTRAST ( ) Result Date: 09/17/2023 CLINICAL DATA:  Stroke follow-up EXAM: CT HEAD WITHOUT CONTRAST TECHNIQUE: Contiguous axial images were obtained from the base of the skull through the vertex without intravenous contrast. RADIATION DOSE REDUCTION: This exam was performed according to the departmental dose-optimization program which includes automated exposure control, adjustment of the mA and/or kV according to patient size and/or use of iterative reconstruction technique. COMPARISON:  09/11/2023 FINDINGS: Brain: Unchanged hypoattenuation within the middle cerebellar peduncles. 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 No mass, hemorrhage or extra-axial collection. Vascular: Atherosclerotic calcification  of the internal carotid arteries at the skull base. No abnormal hyperdensity of the major intracranial arteries or dural venous sinuses. Skull: The visualized skull base, calvarium and extracranial soft tissues are normal. Sinuses/Orbits: No fluid levels or advanced mucosal thickening of the visualized paranasal sinuses. No mastoid or middle ear effusion. Normal orbits. Other: None. IMPRESSION: 1. Unchanged hypoattenuation within the middle cerebellar peduncles, corresponding to areas of diffusion restriction previously seen. 2. 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 Electronically Signed   By: Juanetta Nordmann M.D.   On: 09/17/2023 20:00      LOS: 14 days   Alla Ar, NP [facility] Pager: [phone]  09/19/2023, 7:30 AM

## 2023-09-20 LAB — GLUCOSE, CAPILLARY
Glucose-Capillary: 73 mg/dL (ref 70–99)
Glucose-Capillary: 125 mg/dL — ABNORMAL HIGH (ref 70–99)
Glucose-Capillary: 94 mg/dL (ref 70–99)
Glucose-Capillary: 128 mg/dL — ABNORMAL HIGH (ref 70–99)

## 2023-09-20 NOTE — Progress Notes (Signed)
 Occupational Therapy Weekly Progress Note  Patient Details  Name: Allen Dyer MRN: [id number] Date of Birth: 20-Oct-1935  Beginning of progress report period: Sep 13, 2023 End of progress report period: Sep 20, 2023  Today's Date: 09/20/2023 OT Individual Time: 4098-1191 OT Individual Time Calculation (min): 56 min    Patient has met 3 of 5 short term goals.  Patient had a significant setback, and is again recovering with slightly improved motor control.    Patient continues to demonstrate the following deficits: ataxia and decreased coordination and therefore will continue to benefit from skilled OT intervention to enhance overall performance with BADL and Reduce care partner burden.  Patient progressing toward long term goals..  Continue plan of care.  OT Short Term Goals Week 2:  OT Short Term Goal 1 (Week 2): Patient will assist with lower body dressing in bed by bridging/ half bridging OT Short Term Goal 1 - Progress (Week 2): Met OT Short Term Goal 2 (Week 2): Patient will safely sit at edge of bed with min assist when preparing for a trasnfer to wheelchair/recliner OT Short Term Goal 2 - Progress (Week 2): Met OT Short Term Goal 3 (Week 2): Patient will safely statically sit x 3 min in preparation for toileting activities OT Short Term Goal 3 - Progress (Week 2): Met OT Short Term Goal 4 (Week 2): Patient will complete hygiene at sink with set up assist OT Short Term Goal 4 - Progress (Week 2): Progressing toward goal OT Short Term Goal 5 (Week 2): Patient will feed himself and drink from a cup with intermittent min assist OT Short Term Goal 5 - Progress (Week 2): Progressing toward goal Week 3:  OT Short Term Goal 1 (Week 3): Patient will complete hygiene at sink with set up assist OT Short Term Goal 2 (Week 3): Patient will feed himself and drink from a cup with intermittent min assist OT Short Term Goal 3 (Week 3): STG= LTG due to LOS  Skilled Therapeutic  Interventions/Progress Updates:   Patient received seated in wheelchair after PT session.  Patient agreeable to OT session, but declines showering.  Patient assisted to sink to complete oral care, and transported to gym to address transitional movements.  Ataxia continues to improve, and patient is learning how to manage it - slower movements, maintaining some level of closed chain condition.  Worked on sit to Surveyor, mining in parallel bars.  Patient using arms significantly to pull to stand and before he is forward sufficiently over his feet.  Used cueing and facilitation to encourage more forward weight translation prior to lift off.  Need further emphasis on stand to sit as patient having difficulty with controlled flexion to return back to seat.  Returned to room, and left up in recliner with chair pad alarm in place and personal items in reach.   Therapy Documentation Precautions:  Precautions Precautions: Fall, Other (comment) (DNR) Precaution/Restrictions Comments: Urinary incontinence, DNR Required Braces or Orthoses: Splint/Cast Splint/Cast: R wrist cock up splint Restrictions Weight Bearing Restrictions Per Provider Order: No RUE Weight Bearing Per Provider Order: Weight bearing as tolerated Other Position/Activity Restrictions: as tolerates General:   Vital Signs: Therapy Vitals Temp: 97.7 F (36.5 C) Pulse Rate: 69 Resp: 20 BP: 122/70 Patient Position (if appropriate): Sitting Oxygen Therapy SpO2: 100 % O2 Device: Room Air Pain: Pain Assessment Pain Scale: 0-10 Pain Score: 0-No pain ADL: ADL Eating: Not assessed Grooming: Minimal assistance Where Assessed-Grooming: Sitting at sink Upper Body Bathing: Minimal assistance  Where Assessed-Upper Body Bathing: Shower Lower Body Bathing: Moderate assistance Where Assessed-Lower Body Bathing: Sitting at sink, Standing at sink Upper Body Dressing: Moderate assistance Where Assessed-Upper Body Dressing: Sitting at  sink Lower Body Dressing: Maximal assistance Where Assessed-Lower Body Dressing: Sitting at sink, Standing at sink Toileting: Unable to assess Toilet Transfer: Unable to assess Toilet Transfer Method: Unable to assess Tub/Shower Transfer: Unable to assess Film/video editor: Moderate assistance Film/video editor Method: Warden/ranger: Manufacturing systems engineer    Praxis   Exercises:   Other Treatments:     Therapy/Group: Individual Therapy  Jolanta Cabeza M 09/20/2023, 3:48 PM

## 2023-09-20 NOTE — Plan of Care (Signed)
 Downgrade goals due to safety concerns and slower than anticipated progress  Problem: RH Balance Goal: LTG Patient will maintain dynamic sitting balance (PT) Description: LTG:  Patient will maintain dynamic sitting balance with assistance during mobility activities (PT) Flowsheets (Taken 09/20/2023 0755) LTG: Pt will maintain dynamic sitting balance during mobility activities with:: Moderate Assistance - Patient 50 - 74% Goal: LTG Patient will maintain dynamic standing balance (PT) Description: LTG:  Patient will maintain dynamic standing balance with assistance during mobility activities (PT) Flowsheets (Taken 09/20/2023 0755) LTG: Pt will maintain dynamic standing balance during mobility activities with:: Moderate Assistance - Patient 50 - 74%   Problem: RH Bed Mobility Goal: LTG Patient will perform bed mobility with assist (PT) Description: LTG: Patient will perform bed mobility with assistance, with/without cues (PT). Flowsheets (Taken 09/20/2023 0755) LTG: Pt will perform bed mobility with assistance level of: Moderate Assistance - Patient 50 - 74%   Problem: RH Bed to Chair Transfers Goal: LTG Patient will perform bed/chair transfers w/assist (PT) Description: LTG: Patient will perform bed to chair transfers with assistance (PT). Flowsheets (Taken 09/20/2023 0755) LTG: Pt will perform Bed to Chair Transfers with assistance level: Moderate Assistance - Patient 50 - 74%   Problem: RH Car Transfers Goal: LTG Patient will perform car transfers with assist (PT) Description: LTG: Patient will perform car transfers with assistance (PT). Flowsheets (Taken 09/20/2023 0755) LTG: Pt will perform car transfers with assist:: Moderate Assistance - Patient 50 - 74%   Problem: RH Ambulation Goal: LTG Patient will ambulate in controlled environment (PT) Description: LTG: Patient will ambulate in a controlled environment, # of feet with assistance (PT). Flowsheets (Taken 09/20/2023 0755) LTG: Pt  will ambulate in controlled environ  assist needed:: Moderate Assistance - Patient 50 - 74% LTG: Ambulation distance in controlled environment: 38ft

## 2023-09-20 NOTE — Progress Notes (Signed)
 Physical Therapy Weekly Progress Note  Patient Details  Name: Seymore Brodowski MRN: [id number] Date of Birth: May 08, 1935  Beginning of progress report period: Sep 13, 2023 End of progress report period: Sep 20, 2023  Patient has met 1 of 2 short term goals.  Mr. Terhune has been making slower than anticipated functional progress this past reporting period. He overall requires mod/maxA for bed mobility, mod/maxA for squat pivot transfers, modA for sit<>stands. He's primarily limited by truncal and L sided ataxia, dysphagia and dysarthria, impaired standing balance, impaired sitting balance, generalized weakness and deconditioning. He remains motiviated to work hard during therapy sessions - family plans on taking patient home, even if total care required.   Patient continues to demonstrate the following deficits muscle weakness and muscle joint tightness, decreased cardiorespiratoy endurance, impaired timing and sequencing, unbalanced muscle activation, motor apraxia, ataxia, decreased coordination, and decreased motor planning, decreased visual acuity and decreased visual perceptual skills, decreased midline orientation, decreased awareness, decreased problem solving, decreased safety awareness, decreased memory, and delayed processing, central origin, and decreased sitting balance, decreased standing balance, decreased postural control, hemiplegia, and decreased balance strategies and therefore will continue to benefit from skilled PT intervention to increase functional independence with mobility.  Patient not progressing toward long term goals.  See goal revision..  Plan of care revisions:  . LTG downgraded (see POC note for details)  PT Short Term Goals Week 2:  PT Short Term Goal 1 (Week 2): Pt will complete bed<>chair transfers with maxA PT Short Term Goal 1 - Progress (Week 2): Met PT Short Term Goal 2 (Week 2): Pt will propel wheelchair ~53ft with modA PT Short Term Goal 2 - Progress (Week 2):  Not met Week 3:  PT Short Term Goal 1 (Week 3): STG = LTG due to ELOS  Therapy Documentation Precautions:  Precautions Precautions: Fall, Other (comment) (DNR) Precaution/Restrictions Comments: Urinary incontinence, DNR Required Braces or Orthoses: Splint/Cast Splint/Cast: R wrist cock up splint Restrictions Weight Bearing Restrictions Per Provider Order: No RUE Weight Bearing Per Provider Order: Weight bearing as tolerated Other Position/Activity Restrictions: as tolerates General:    Talyah Seder P Indianna Boran 09/20/2023, 7:52 AM

## 2023-09-20 NOTE — Progress Notes (Signed)
 Patient ID: Allen Dyer, male   DOB: 1935-07-21, 88 y.o.   MRN: [id number]  Spoke with daughter via telephone to discuss team conference goals of mod assist level and discharge date 6/5. Discussed need for ramp she reports the garage only has two steps so better to go in there. Have set up education for Tuesday and Wednesday from 1:00-4:00 pm for hands on education. Will let team know of this. Have left some material for daughter regarding [employer] benefits and how to apply for assist.

## 2023-09-20 NOTE — Progress Notes (Signed)
 Physical Therapy Session Note  Patient Details  Name: Allen Dyer MRN: [id number] Date of Birth: [birth year]  Today's Date: 09/20/2023 PT Individual Time: 7829-5621 + 1400-1445 PT Individual Time Calculation (min): 44 min  + 45 min  Short Term Goals: Week 2:  PT Short Term Goal 1 (Week 2): Pt will complete bed<>chair transfers with maxA PT Short Term Goal 2 (Week 2): Pt will propel wheelchair ~55ft with modA  Skilled Therapeutic Interventions/Progress Updates:      1st session: Pt in recliner on arrival with BLE elevated and eyes closed. Awakens to voice and reports no pain, just fatiuged.   Transferred patient from recliner to wheelchair via Dwana Gist - minA for rising to stand and CGA for sitting balance in perched position. Transported patient into main gym and placed in // bars to work on standing balance, ataxia management, and activity tolerance. Activities included static standing, standing with 90 deg turns both directions, and alternating (intentional to target) foot taps onto bosu ball, and forward/backward stepping for the length of the // bars. Pt needing minA for balance and truncal ataxia management for all tasks. Demonstrates L > R LE ataxia. Seated rest breaks provided throughout for recovery.   Pt returned to his room and was left sitting upright in wheelchair with call bell in lap. Needs met.   2nd session: Pt sitting up in recliner with his daughter at bedside. Pt agreeable to therapy - no reports of pain but fatigued. Daughter reports plan to possibly purchase a generic brand of a Dwana Gist off of [employer]. Daughter asking if patient will progress to using a RW for transfers before DC.   Patient noted to be incontinent of urine with soiled pants and brief. Used Sara Stedy to assist patient in cleaning and changing. Max/totalA for lower body dressing and brief management. Patient uses Stedy well - CGA overall for sit<>Stands and standing balance in the [employer].    Transported patient to gym space to address functional transfers using the RW. Patient required modA overall for completing a stand pivot transfer with RW. Increased assist needed due to ataxia and difficulty initiating forward weight shifting. His weight is mostly on heels and his legs will slide on the floor - added larger pair of socks to help with grip during transfers.   Pt returned to his room and then assist to recliner (his request) from w/c with a modA stand pivot transfer using face-to-face with his hands on PT's shoulders. PT facilitating lateral weight shifting and cues for stepping during transfer. Left with BLE elevated and call bell in lap.    Therapy Documentation Precautions:  Precautions Precautions: Fall, Other (comment) (DNR) Precaution/Restrictions Comments: Urinary incontinence, DNR Required Braces or Orthoses: Splint/Cast Splint/Cast: R wrist cock up splint Restrictions Weight Bearing Restrictions Per Provider Order: No RUE Weight Bearing Per Provider Order: Weight bearing as tolerated Other Position/Activity Restrictions: as tolerates General:     Therapy/Group: Individual Therapy  Pheobe Brass 09/20/2023, 7:52 AM

## 2023-09-20 NOTE — Progress Notes (Addendum)
 PROGRESS NOTE   Subjective/Complaints: Discussed with patient that CT and labs are stable Aide said he did a good job standing in the bathroom!  ROS: Patient denies fever, rash, sore throat, blurred vision, dizziness, nausea, vomiting, diarrhea, cough, shortness of breath or chest pain, joint or back/neck pain, headache, or mood change. Drooling improved, cognition improved  Objective:   CT HEAD WO CONTRAST ( ) Result Date: 09/19/2023 CLINICAL DATA:  88 year old male with vision loss. Recent cerebellar infarcts with abnormal signal in the cerebellar peduncles. EXAM: CT HEAD WITHOUT CONTRAST TECHNIQUE: Contiguous axial images were obtained from the base of the skull through the vertex without intravenous contrast. RADIATION DOSE REDUCTION: This exam was performed according to the departmental dose-optimization program which includes automated exposure control, adjustment of the mA and/or kV according to patient size and/or use of iterative reconstruction technique. COMPARISON:  Head CT 09/17/2023 and earlier. FINDINGS: Brain: Hypodense scattered bilateral cerebellar infarcts, and confluent hypodensity again noted in the bilateral cerebellar peduncles. No hemorrhagic transformation. No posterior fossa mass effect. Stable CT appearance from 2 days ago. No superimposed midline shift, ventriculomegaly, mass effect, evidence of mass lesion, intracranial hemorrhage or new cortically based infarct. Bilateral PCA territory and optic radiation region gray-white differentiation appears stable and normal except for subtle chronic right occipital pole encephalomalacia which is more apparent on MRI. Vascular: Extensive Calcified atherosclerosis at the skull base. No suspicious intracranial vascular hyperdensity. Skull: Intact, stable. Sinuses/Orbits: Visualized paranasal sinuses and mastoids are clear. Other: Stable postoperative appearance of the globes. No  acute orbit or scalp soft tissue finding identified. IMPRESSION: 1. Stable CT appearance of bilateral cerebellar infarcts and abnormal middle cerebellar peduncles as seen on MRI. No hemorrhagic transformation or mass effect. 2. No new intracranial abnormality. Small area of chronic right occipital pole encephalomalacia better demonstrated by MRI. Electronically Signed   By: Marlise Simpers M.D.   On: 09/19/2023 11:48     No results for input(s): "WBC", "HGB", "HCT", "PLT" in the last 72 hours.   No results for input(s): "NA", "K", "CL", "CO2", "GLUCOSE", "BUN", "CREATININE", "CALCIUM " in the last 72 hours.    Intake/Output Summary (Last 24 hours) at 09/20/2023 1012 Last data filed at 09/20/2023 2841 Gross per 24 hour  Intake 598 ml  Output --  Net 598 ml        Physical Exam: Vital Signs Blood pressure (!) 127/53, pulse 62, temperature 97.9 F (36.6 C), temperature source Oral, resp. rate 16, height 5\' 7"  (1.702 m), weight 89.2 kg, SpO2 98%.  Constitutional: No distress . Vital signs reviewed. HEENT: NCAT, EOMI, oral membranes moist, visual deficits noted Neck: supple Cardiovascular: Bradycardia Respiratory/Chest: CTA Bilaterally without wheezes or rales. Normal effort    GI/Abdomen: BS +, non-tender, non-distended Ext: no clubbing, cyanosis, or edema Psych: pleasant and cooperative    Skin: intact; mild right upper extremity swelling/edema  Neuro:very dysarthric ataxic speech, improved conversation and awareness. Moves all 4's but does demonstrate ataxia   No tone or spasticity noted.  Cranial nerves grossly intact.   MSK: R  arm remains in splint, stable 5/29        Assessment/Plan: 1. Functional deficits which require 3+ hours per day  of interdisciplinary therapy in a comprehensive inpatient rehab setting. Physiatrist is providing close team supervision and 24 hour management of active medical problems listed below. Physiatrist and rehab team continue to assess barriers  to discharge/monitor patient progress toward functional and medical goals  Care Tool:  Bathing    Body parts bathed by patient: Right arm, Chest, Abdomen, Face   Body parts bathed by helper: Right arm, Left arm, Chest, Abdomen, Front perineal area, Buttocks, Right upper leg, Left upper leg, Right lower leg, Left lower leg, Face (shower)     Bathing assist Assist Level: Total Assistance - Patient < 25%     Upper Body Dressing/Undressing Upper body dressing   What is the patient wearing?: Pull over shirt    Upper body assist Assist Level: Moderate Assistance - Patient 50 - 74%    Lower Body Dressing/Undressing Lower body dressing      What is the patient wearing?: Pants, Incontinence brief     Lower body assist Assist for lower body dressing: Total Assistance - Patient < 25%     Toileting Toileting Toileting Activity did not occur (Clothing management and hygiene only): N/A (no void or bm)  Toileting assist Assist for toileting: Total Assistance - Patient < 25%     Transfers Chair/bed transfer  Transfers assist     Chair/bed transfer assist level: Maximal Assistance - Patient 25 - 49%     Locomotion Ambulation   Ambulation assist   Ambulation activity did not occur: Safety/medical concerns  Assist level: Moderate Assistance - Patient 50 - 74% Assistive device: Hand held assist Max distance: 91ft   Walk 10 feet activity   Assist  Walk 10 feet activity did not occur: Safety/medical concerns  Assist level: Moderate Assistance - Patient - 50 - 74% Assistive device: Hand held assist   Walk 50 feet activity   Assist Walk 50 feet with 2 turns activity did not occur: Safety/medical concerns  Assist level: Minimal Assistance - Patient > 75% Assistive device: Walker-Eva    Walk 150 feet activity   Assist Walk 150 feet activity did not occur: Safety/medical concerns  Assist level: Minimal Assistance - Patient > 75% Assistive device: Walker-Eva     Walk 10 feet on uneven surface  activity   Assist Walk 10 feet on uneven surfaces activity did not occur: Safety/medical concerns         Wheelchair     Assist Is the patient using a wheelchair?: Yes Type of Wheelchair: Manual    Wheelchair assist level: Dependent - Patient 0%      Wheelchair 50 feet with 2 turns activity    Assist        Assist Level: Dependent - Patient 0%   Wheelchair 150 feet activity     Assist      Assist Level: Dependent - Patient 0%   Blood pressure (!) 127/53, pulse 62, temperature 97.9 F (36.6 C), temperature source Oral, resp. rate 16, height 5\' 7"  (1.702 m), weight 89.2 kg, SpO2 98%.    Medical Problem List and Plan: 1. Functional deficits secondary to bilateral cerebellar infarct, right more than left secondary large vessel disease from severe posterior circulation stenosis             -patient may shower             -ELOS/Goals: 10-12,PT/OT sup to mod I             -Grounds pass ordered  -MRI repeat 5/17, was discussed  with neurology Dr. Bonnita Buttner , not acute cva but some progression of prior strokes. No additional changes, avoid hypotension   -Consult SLP- worsened dysarthria over past few days  - 5-19: Multiple messages from therapist and nursing concerns regarding worsening dysarthria, ataxia, and right facial sensory loss.  No new focal deficits on exam, however symptoms did not improve with medication adjustments.  Stat CT head without acute findings.  No hemorrhagic transformation or mass effect.  5/26-pt alert and appropriate today. -Continue CIR therapies including PT, OT, and SLP   Continue D3 2,000U daily 2.  Antithrombotics: -DVT/anticoagulation:  Pharmaceutical: Lovenox              -antiplatelet therapy: Aspirin  81 mg daily and Plavix  75 mg day x 3 months then Plavix  alone 3. Pain Management: Neurontin  300 mg twice daily--well-controlled 4. Mood/Behavior/Sleep: Provide emotional support              -antipsychotic agents: N/A 5. Neuropsych/cognition: This patient is capable of making decisions on his own behalf.  -5/18 start lexapro  for mood, consider neuropsych consult  5/19: DC lexapro  d/t worsening lethargy, dysarthria   6. Skin/Wound Care: Routine skin checks  5-19: Right upper extremity duplex ordered prior for swelling negative for DVT, however with multiple cystic structures concerning for abscess.  CT recommended for further assessment, order placed.  No current fevers or leukocytosis to indicate infection.  7. Fluids/Electrolytes/Nutrition: Routine in and outs with follow-up chemistries 8.  Klebsiella oxytoca UTI.  Completing course of Duricef  - Per urology note, is to continue on cefadroxil  1 tab p.o. nightly for CAUTI prophylaxis while Foley is in place  9.  CKD stage IV.  Discussed that creatinine is improving  -Cr overalls stable 2.47/BUN 50- monitor  5-26 Cr in range of recent labs 10.  Chronic combined systolic and diastolic congestive heart failure. EF 25-30% Monitor for any signs of fluid overload.  Follow-up cardiology services.  Continue Demadex  60 mg twice daily, weight reviewed and is increased, daily weights ordered, weights are stable/increased, continue current regimen  5/17 wt trending up although suspect may be inaccurate, no signs of overload noted  5/18 Wt appears stable, monitor  5/19: weights downtrending, ? Stroke recrudescence with normotension d/t stenosis, reduce torsemide  to 60 mg daily  5/26 sl downward trend still. Was eating well this morning -continue to encourage intake   Filed Weights   09/18/23 0500 09/19/23 0432 09/20/23 0626  Weight: 89.7 kg 88.9 kg 89.2 kg     11.  Nonischemic cardiomyopathy.  Followed by cardiology service Dr. Micael Adas.  Continue Imdur  30 mg daily and Coreg  6.25 mg twice daily.  See #10.  12.  History of prostate cancer, BPH.  Followed by Dr. Freddi Jaeger.  Flomax  0.4 mg twice daily             -Check PVR/bladder scan  5/17-8  he has foley in placed, clear yellow urine  5/26 pt is emptying incontinently  13.  Diabetes mellitus with peripheral neuropathy and hemoglobin A1c 7.0.  Glucotrol  5 mg daily  -5/18 few elevated CBGs, continue to monitor trend for now  5-19: BG is generally well-controlled, monitor  CBG (last 3)  Recent Labs    09/19/23 1649 09/19/23 2120 09/20/23 0646  GLUCAP 172* 102* 73     14. HTN. Long term goal normotensive. Home meds carvedilol , imdur , toresemide  -5/18 would like avoid hypotension, decrease Imdur  to 15 mg, decrease Coreg  to 3.125 twice daily, decrease torsemide  to daily  5/19: Weight  down, BP normotensive but may be underperfusing; reduce torsemide  as above. Can consider reducing flomax  as well pending DC foley trial but cannot stay off long term d/t chronic retention.  5/26 bp controlled Vitals:   09/19/23 1932 09/20/23 0626  BP: 127/70 (!) 127/53  Pulse: 64 62  Resp: 18 16  Temp: 98.2 F (36.8 C) 97.9 F (36.6 C)  SpO2: 96% 98%      15. Obesity. Body mass index is 33.67 kg/m.             -dietary education  16. R wrist pain due to fall. Wearing wrist brace. Xray without fracture. Voltaren  gel scheduled             -tylenol  prn  -5/17 Will check vas US  due to R arm swelling  5-19: See above; no DVT, possible abscesses/cystic structures.    5/20: CT reviewed and shows age indeterminate fracture, will consult ortho  17. Constipation: D/c miralax , d/c [place], encouraged prune juice, asked nursing to bring prune juice for him  Last bowel movement 5/21  18. Dizziness/vertigo/nystagmus: vestibular eval ordered   Decrease meclizine  to daily given lethargy.  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  Scopolamine patch added  19. Anemia: Hgb reviewed and is slightly improved  20. AMS: MRI brain and CT Head obtained without acute concerning findings, have consulted neurology to review, does appear better today  21. Drooling: scopolamine patch added,  helping, continue  22. Lethargy: modafinil ordered, helping, continue  23. Penile swelling: placed order to d/c foley, improved  24. Type 7 stool: last BM 5/28, continue metamucil  25, Bradycardia: continue to monitor TID  26. Hypotension: weight reviewed and has increased to 196kg, continue torsemide  30mg   27. New visual deficits: CT head ordered, discussed with patient that this is stable     LOS: 15 days A FACE TO FACE EVALUATION WAS PERFORMED  Kemora Pinard P Nicollette Wilhelmi 09/20/2023, 10:12 AM

## 2023-09-20 NOTE — Progress Notes (Signed)
 Speech Language Pathology Daily Session Note  Patient Details  Name: Allen Dyer MRN: [id number] Date of Birth: 14-Sep-1935  Today's Date: 09/20/2023 SLP Individual Time: 0865-7846 SLP Individual Time Calculation (min): 59 min  Short Term Goals: Week 2: SLP Short Term Goal 1 (Week 2): Patient will tolerate least restrictive diet across a meal with no more than 1 overt clinical instance of suspected aspiration given min cues for use of swallow compensatory strategies. SLP Short Term Goal 2 (Week 2): Patient will orient to date utilizing external aids when provided with min verbal cues. SLP Short Term Goal 3 (Week 2): Patient will selectively attend to speech therapy tasks for 10 minute intervals given min assist. SLP Short Term Goal 4 (Week 2): Patient will recall pertinent daily information with 80% accuracy utilizing external/internal aids when given min assist. SLP Short Term Goal 5 (Week 2): Patient will solve basic environmental problems with 80% accuracy given min assist. SLP Short Term Goal 6 (Week 2): Patient will utilize speech intelligibility and communication repair strategies in 8/10 opportunities at the phrase level given min assist.  Skilled Therapeutic Interventions: SLP conducted skilled therapy session targeting dysphagia and communication goals. SLP assessed tolerance of current diet with Dys3/thin liquids lunch tray. Patient accepted all liquids via 10cc Provale cup. Across meal, patient exhibited 1 instance of immediate cough. Daughter present and asking questions re: aspiration; provided education re: patient's increased risk of aspiration and related sequelae (including pneumonia), however discussed that at this time, patient's overall tolerance of thin liquids in the Provale cup is fair is patient is not currently at a point that would warrant downgrade to thicker liquids. Daughter verbalized understanding. Patient maintained adequate slow rate, thorough mastication, and use of  all swallow strategies with supervision. Recommend continuation of current diet. SLP notes subjectively improved cough strength during aforementioned coughing instance. Patient completed 5 sets of 5 EMST at 15cmH2O. Patient benefited from min assist to achieve adequate lip seal. In final minutes of session, encouraged patient to utilize SLOP speech intelligibility strategies. Patient benefited from min cues to increase volume at the word and phrase levels. Patient was left in room with call bell in reach and alarm set. SLP will continue to target goals per plan of care.        Pain Pain Assessment Pain Scale: 0-10 Pain Score: 0-No pain  Therapy/Group: Individual Therapy  Brylynn Hanssen, M.A., CCC-SLP  Grayce Budden A Sharry Beining 09/20/2023, 1:41 PM

## 2023-09-20 NOTE — Plan of Care (Signed)
 DC home ambulation and stair goals due to severity of ataxia and safety concern Problem: RH Ambulation Goal: LTG Patient will ambulate in home environment (PT) Description: LTG: Patient will ambulate in home environment, # of feet with assistance (PT). Outcome: Not Applicable   Problem: RH Stairs Goal: LTG Patient will ambulate up and down stairs w/assist (PT) Description: LTG: Patient will ambulate up and down # of stairs with assistance (PT) Outcome: Not Applicable

## 2023-09-20 NOTE — Plan of Care (Signed)
                                                     Palliative Care Progress Note   Patient Name: Allen Dyer       Date: 09/20/2023 DOB: 1936/03/07  Age: 88 y.o. MRN#: [id number] Attending Physician: Liam Redhead, MD Primary Care Physician: Lanae Pinal, MD Admit Date: 09/05/2023  Extensive chart review completed including labs, vital signs, imaging, progress notes, orders, and available advanced directive documents from current and previous encounters.   After reviewing patient's chart, I attempted to meet with patient at bedside.  He was not in his room.  Counseled with NT who advised patient was working with physical therapy.  I attempted to see patient in rehab kitchen as well as rehab gym.  Patient was not present.  After attempting to see patient on the unit, I spoke with patient's daughter Allen Dyer over the phone.  Introduced palliative medicine and my role in patient's care team.  Allen Dyer and I agreed to meet bedside with patient tomorrow, Friday, at 12:30 PM, to continue goals of care discussions.  Complete note to follow once goals of care discussions have been completed.  Thank you for allowing the Palliative Medicine Team to assist in the care of Allen Dyer.  Judeen Nose L. Rebbeca Campi, DNP, FNP-BC Palliative Medicine Team  No charge

## 2023-09-21 DIAGNOSIS — Z515 Encounter for palliative care: Secondary | ICD-10-CM

## 2023-09-21 LAB — GLUCOSE, CAPILLARY
Glucose-Capillary: 81 mg/dL (ref 70–99)
Glucose-Capillary: 112 mg/dL — ABNORMAL HIGH (ref 70–99)
Glucose-Capillary: 116 mg/dL — ABNORMAL HIGH (ref 70–99)
Glucose-Capillary: 191 mg/dL — ABNORMAL HIGH (ref 70–99)

## 2023-09-21 MED ORDER — GABAPENTIN 100 MG PO CAPS
200.0000 mg | ORAL_CAPSULE | Freq: Every day | ORAL | Status: DC
  Administered 2023-09-21 – 2023-09-24 (×4): 200 mg via ORAL
  Filled 2023-09-21 (×4): qty 2

## 2023-09-21 NOTE — Consult Note (Signed)
 Consultation Note Date: 09/21/2023 at 1230  Patient Name: Allen Dyer  DOB: 09/29/1935  MRN: [id number]  Age / Sex: 88 y.o., male  PCP: Candance Certain, Dibas, MD Referring Physician: Liam Redhead, MD  HPI/Patient Profile: 88 y.o. male  with past medical history of systolic and diastolic heart failure, nonischemic cardiomyopathy, CVA, prostate cancer, BPH, GERD, gout, multiple myeloma, nonobstructive CAD, HLD, HTN, type 2 diabetes, and peripheral neuropathy admitted on 09/05/2023 with dizziness with a fall at home and right wrist injury.  CT angiogram revealed severe posterior circulation disease with right more than left cerebellar hemisphere subacute ischemic infarcts.  Neurology was following.  Patient admitted to [facility] inpatient rehab for comprehensive rehab program.  PMT was consulted to support patient and family with goals of care discussions.   Clinical Assessment and Goals of Care: Extensive chart review completed prior to meeting patient including labs, vital signs, imaging, progress notes, orders, and available advanced directive documents from current and previous encounters. I then met with patient and his daughter Allen Dyer at bedside to discuss diagnosis prognosis, GOC, EOL wishes, disposition and options.  I introduced Palliative Medicine as specialized medical care for people living with serious illness. It focuses on providing relief from the symptoms and stress of a serious illness. The goal is to improve quality of life for both the patient and the family.  We discussed a brief life review of the patient.  Patient is a Engineer, materials and retired Paramedic.  He is a widow and has 1 daughter, Allen Dyer, with whom he lives.  As far as functional and nutritional status PTA patient was independent with all ADLs.  Patient and daughter deny any difficulty with p.o. intake or  nutrition.  We discussed patient's current illness -  and what it means in the larger context of patient's on-going co-morbidities.   I attempted to elicit values and goals of care important to the patient.  Patient shares he would like to be able to walk again.  He says he is is also looking forward to being discharged.  He and daughter endorse patient is improving at rehab but would like to be home whenever it is appropriate.    Discussed that after team conference patient's discharge date has been determined for 6/5.  Patient's daughter Allen Dyer is working to prepare a ramp in home for patient to be able to safely be at home.  She is also working with [facility] to utilize his [place] benefits.  Social worker following closely for planning.  Advance directives, concepts specific to code status, artificial feeding and hydration, and rehospitalization were considered and discussed.  Patient shares that in the event he is unable to speak for himself then his daughter Allen Dyer would be his next of kin decision maker.  Allen Dyer shares that when her mother passed from C. difficile in 2018 she and her family had extensive discussions about end-of-life wishes.  She shares her father has been very clear since that time that he would like to  be a DNR and never be placed on life support.  DNR with limited interventions is appropriate at this time.  Additionally, patient and daughter endorse he would not want life-prolonging measures such as a feeding tube or artificial hydration.  In light of this discussion, introduced the concept of a MOST form to further outline patient's advance care planning wishes.  Copy of MOST left at bedside for patient and daughter to review.   Discussed with patient/family the importance of continued conversation with family and the medical providers regarding overall plan of care and treatment options, ensuring decisions are within the context of the patient's values and GOCs.    Patient  and daughter appreciative of my visit.  No change to plan of care at this time.  PT into visit patient towards the end of my visit.  PMT will continue to follow and support patient and family throughout his hospitalization.  Primary Decision Maker NEXT OF KIN  Physical Exam Vitals reviewed.  HENT:     Head: Normocephalic.     Mouth/Throat:     Mouth: Mucous membranes are moist.     Comments: drooling Pulmonary:     Effort: Pulmonary effort is normal.  Abdominal:     Palpations: Abdomen is soft.  Neurological:     Mental Status: He is alert and oriented to person, place, and time.     Comments: Ataxic speech  Psychiatric:        Mood and Affect: Mood normal.        Behavior: Behavior normal.        Thought Content: Thought content normal.        Judgment: Judgment normal.     Palliative Assessment/Data: 50-60%     Thank you for this consult. Palliative medicine will continue to follow and assist holistically.   Time Total: 75 minutes  Time spent includes: Detailed review of medical records (labs, imaging, vital signs), medically appropriate exam (mental status, respiratory, cardiac, skin), discussed with treatment team, counseling and educating patient, family and staff, documenting clinical information, medication management and coordination of care.  Signed by: Eller Gut, DNP, FNP-BC Palliative Medicine   Please contact Palliative Medicine Team providers via [facility] for questions and concerns.

## 2023-09-21 NOTE — Progress Notes (Signed)
 Patient ID: Allen Dyer, male   DOB: 07-14-1935, 88 y.o.   MRN: [id number]  have left message for Sharl Davies [phone]) in [place] where pt is connected to to see if qualifies for an aide. Daughter asked worker to do this. Await return call.

## 2023-09-21 NOTE — Progress Notes (Signed)
 Physical Therapy Session Note  Patient Details  Name: Allen Dyer MRN: [id number] Date of Birth: Jun 28, 1935  Today's Date: 09/21/2023 PT Individual Time: 0800-0900 + 4098-1191 + 1300-1409 PT Individual Time Calculation (min): 60 min  + 24 min + 69 min  Short Term Goals: Week 3:  PT Short Term Goal 1 (Week 3): STG = LTG due to ELOS  Skilled Therapeutic Interventions/Progress Updates:      1st session: Pt reporting difficulty breathing while lying in bed with HOB elevated. Vitals taken which were WNL - O2 100% and BP 128/64 with a HR of 64. Pt reports breathing eases with HOB lowered. Pt reports no pains. No further complaints of difficulty breathing during treatment.   Supine<>Sitting EOB with SUPERVISION with hosiptal bed features and ++ time. Completed transfer from EOB to w/c via Stedy - standing in stedy with CGA. RN in room for med pass.   Pt transported at w/c level to day room gym and assisted onto Nustep with a modA stand step transfer. Assist needed to facilitate lateral weight shifting with directional cueing for stepping due to ataxia. Pt completed x10 minutes of Nustep training with emphasis on coordination and reciprocal stepping patterns.   When transferring off of Nustep, back to the wheelchair. Patient had large urinary incontinent episode while standing. Because of this, his briefs/pants fell to the ground but needed assist for balance due to ataxia and balance impairments. +2 assist for bringing towels to cover and clean patient, as well as bringing privacy screen.   Patient returned to his room and was left up in wheelchair with needs met. Call bell in lap.   2nd session: Pt sitting up in wheelchair, dressed and agreeable to therapy treatment. Focused on NMR for standing balance and gait training in // bars. Taken to main gym to address these.   Sit<>stand in // bars with CGA/supervision by pulling up to stand. Worked on postural awareness and control, moderate  pressure applied to upper traps to help quiet/calm his ataxia/tremors. Pt completed forward/backward ambulation in // bars with CGA/minA and lateral stepping L<>R in // bars with CGA/minA - cues for motor planning/coordination and general sequencing.  Pt returned to room and was left sitting up in wheelchair. Needs met.   3rd session: Pt in wheelchair with his daughter at bedside. Reviewed DC plan, anticipated DME needs, etc. Family plan on getting a lift chair and self purchasing a Stedy. Also discussed getting bed rails for their bed at home.   Pt transported to day room gym at w/c level for time management. Assisted onto mat table with modA stand step transfer with patient holding PT's shoulders. Continues to have severe ataxia in BLE and truncal sway that he has trouble calming.   Able to sit unsupported at [facility] with CGA to close suprevision which is improved since the start of this week. Worked on sit<>stands, standing balance, BLE coordination in sitting and standing, and trunk rotations in standing. ModA needed for balance for all activities. Used Squigz on window to work on turns and pulls with modA for balance. Sitting activities including seated trunk rotations with 4# med ball, seated sit ups (modified with limited ROM) with 4# med ball. Rest breaks provided as needed. Assist for managing secretions as well.   Pt returned to his room and was left sitting upright in w/c with chair pad alarm on, call bell in reach, made comfortable with blankets. Needs met.    Therapy Documentation Precautions:  Precautions Precautions: Fall, Other (  comment) (DNR) Precaution/Restrictions Comments: Urinary incontinence, DNR Required Braces or Orthoses: Splint/Cast Splint/Cast: R wrist cock up splint Restrictions Weight Bearing Restrictions Per Provider Order: No RUE Weight Bearing Per Provider Order: Weight bearing as tolerated Other Position/Activity Restrictions: as tolerates General:      Therapy/Group: Individual Therapy  Pheobe Brass 09/21/2023, 7:47 AM

## 2023-09-21 NOTE — Progress Notes (Signed)
 Occupational Therapy Session Note  Patient Details  Name: Allen Dyer MRN: [id number] Date of Birth: 01-22-1936  Today's Date: 09/21/2023 OT Individual Time: 2542-7062 OT Individual Time Calculation (min): 56 min    Short Term Goals: Week 3:  OT Short Term Goal 1 (Week 3): Patient will complete hygiene at sink with set up assist OT Short Term Goal 2 (Week 3): Patient will feed himself and drink from a cup with intermittent min assist OT Short Term Goal 3 (Week 3): STG= LTG due to LOS  Skilled Therapeutic Interventions/Progress Updates:    Patient received sitting in wheelchair, agreeable to shower this session.  Patient states "there is a disconnect between my brain and my body."  Patient reports overall less "rocking" in trunk.  Sitting quietly in chair with no ataxia at rest.  Patient completed modified stand pivot transfer to shower bench with MAX assist.  Patient with poorly controlled trunk and limb coordination to step even with grab bar.  Used Stedy lift to transfer out of shower.  (Daughter is obtaining a similar lift for home.)  Patient loves warm water in shower, and was independent prior to this stroke.  Patient was able to complete more of his bathing prior to ataxia.  Patient now works to control sitting balance during self care to allow caregivers to safely provide care.  Patient incontinent of urine when standing to get into shower, and again when transferring out of shower.  Changed brief a second time to ensure patient is dry.  Worked on partial stand to stand transitions as needed for lower body bathing/ dressing/ undressing.  Patient aware that movement is improving, but he is frustrated with his limited ability to fully control motor output.  Patient's right wrist unwrapped, cleaned and re-wrapped to support wrist.    Therapy Documentation Precautions:  Precautions Precautions: Fall, Other (comment) (DNR) Precaution/Restrictions Comments: Urinary incontinence, DNR Required  Braces or Orthoses: Splint/Cast Splint/Cast: R wrist cock up splint Restrictions Weight Bearing Restrictions Per Provider Order: No RUE Weight Bearing Per Provider Order: Weight bearing as tolerated Other Position/Activity Restrictions: as tolerates  Pain: Pain Assessment Pain Scale: 0-10 Pain Score: 0-No pain      Therapy/Group: Individual Therapy  Thaison Kolodziejski M 09/21/2023, 11:34 AM

## 2023-09-21 NOTE — Progress Notes (Signed)
 PROGRESS NOTE   Subjective/Complaints: No new complaints this morning Drooling appears to be improved CBGs much improved, semglee  and ISS d/ced  ROS: Patient denies fever, rash, sore throat, blurred vision, dizziness, nausea, vomiting, diarrhea, cough, shortness of breath or chest pain, joint or back/neck pain, headache, or mood change. Drooling improved, cognition improved  Objective:   CT HEAD WO CONTRAST ( ) Result Date: 09/19/2023 CLINICAL DATA:  88 year old male with vision loss. Recent cerebellar infarcts with abnormal signal in the cerebellar peduncles. EXAM: CT HEAD WITHOUT CONTRAST TECHNIQUE: Contiguous axial images were obtained from the base of the skull through the vertex without intravenous contrast. RADIATION DOSE REDUCTION: This exam was performed according to the departmental dose-optimization program which includes automated exposure control, adjustment of the mA and/or kV according to patient size and/or use of iterative reconstruction technique. COMPARISON:  Head CT 09/17/2023 and earlier. FINDINGS: Brain: Hypodense scattered bilateral cerebellar infarcts, and confluent hypodensity again noted in the bilateral cerebellar peduncles. No hemorrhagic transformation. No posterior fossa mass effect. Stable CT appearance from 2 days ago. No superimposed midline shift, ventriculomegaly, mass effect, evidence of mass lesion, intracranial hemorrhage or new cortically based infarct. Bilateral PCA territory and optic radiation region gray-white differentiation appears stable and normal except for subtle chronic right occipital pole encephalomalacia which is more apparent on MRI. Vascular: Extensive Calcified atherosclerosis at the skull base. No suspicious intracranial vascular hyperdensity. Skull: Intact, stable. Sinuses/Orbits: Visualized paranasal sinuses and mastoids are clear. Other: Stable postoperative appearance of the globes. No  acute orbit or scalp soft tissue finding identified. IMPRESSION: 1. Stable CT appearance of bilateral cerebellar infarcts and abnormal middle cerebellar peduncles as seen on MRI. No hemorrhagic transformation or mass effect. 2. No new intracranial abnormality. Small area of chronic right occipital pole encephalomalacia better demonstrated by MRI. Electronically Signed   By: Marlise Simpers M.D.   On: 09/19/2023 11:48     No results for input(s): "WBC", "HGB", "HCT", "PLT" in the last 72 hours.   No results for input(s): "NA", "K", "CL", "CO2", "GLUCOSE", "BUN", "CREATININE", "CALCIUM " in the last 72 hours.    Intake/Output Summary (Last 24 hours) at 09/21/2023 0930 Last data filed at 09/21/2023 0800 Gross per 24 hour  Intake 712 ml  Output --  Net 712 ml        Physical Exam: Vital Signs Blood pressure 131/78, pulse 65, temperature 98 F (36.7 C), temperature source Oral, resp. rate 18, height 5\' 7"  (1.702 m), weight 90.6 kg, SpO2 93%.  Constitutional: No distress . Vital signs reviewed. HEENT: NCAT, EOMI, oral membranes moist, visual deficits noted Neck: supple Cardiovascular: Bradycardia Respiratory/Chest: CTA Bilaterally without wheezes or rales. Normal effort    GI/Abdomen: BS +, non-tender, non-distended Ext: no clubbing, cyanosis, or edema Psych: pleasant and cooperative    Skin: intact; mild right upper extremity swelling/edema  Neuro:very dysarthric ataxic speech, improved conversation and awareness. Moves all 4's but does demonstrate ataxia   No tone or spasticity noted.  Cranial nerves grossly intact.   MSK: R  arm remains in splint, stable 5/29  Functional mobility: using Provale cup independently        Assessment/Plan: 1. Functional deficits which require 3+  hours per day of interdisciplinary therapy in a comprehensive inpatient rehab setting. Physiatrist is providing close team supervision and 24 hour management of active medical problems listed  below. Physiatrist and rehab team continue to assess barriers to discharge/monitor patient progress toward functional and medical goals  Care Tool:  Bathing    Body parts bathed by patient: Right arm, Chest, Abdomen, Face   Body parts bathed by helper: Right arm, Left arm, Chest, Abdomen, Front perineal area, Buttocks, Right upper leg, Left upper leg, Right lower leg, Left lower leg, Face (shower)     Bathing assist Assist Level: Total Assistance - Patient < 25%     Upper Body Dressing/Undressing Upper body dressing   What is the patient wearing?: Pull over shirt    Upper body assist Assist Level: Moderate Assistance - Patient 50 - 74%    Lower Body Dressing/Undressing Lower body dressing      What is the patient wearing?: Pants, Incontinence brief     Lower body assist Assist for lower body dressing: Total Assistance - Patient < 25%     Toileting Toileting Toileting Activity did not occur (Clothing management and hygiene only): N/A (no void or bm)  Toileting assist Assist for toileting: Total Assistance - Patient < 25%     Transfers Chair/bed transfer  Transfers assist     Chair/bed transfer assist level: Maximal Assistance - Patient 25 - 49%     Locomotion Ambulation   Ambulation assist   Ambulation activity did not occur: Safety/medical concerns  Assist level: Moderate Assistance - Patient 50 - 74% Assistive device: Hand held assist Max distance: 26ft   Walk 10 feet activity   Assist  Walk 10 feet activity did not occur: Safety/medical concerns  Assist level: Moderate Assistance - Patient - 50 - 74% Assistive device: Hand held assist   Walk 50 feet activity   Assist Walk 50 feet with 2 turns activity did not occur: Safety/medical concerns  Assist level: Minimal Assistance - Patient > 75% Assistive device: Walker-Eva    Walk 150 feet activity   Assist Walk 150 feet activity did not occur: Safety/medical concerns  Assist level: Minimal  Assistance - Patient > 75% Assistive device: Walker-Eva    Walk 10 feet on uneven surface  activity   Assist Walk 10 feet on uneven surfaces activity did not occur: Safety/medical concerns         Wheelchair     Assist Is the patient using a wheelchair?: Yes Type of Wheelchair: Manual    Wheelchair assist level: Dependent - Patient 0%      Wheelchair 50 feet with 2 turns activity    Assist        Assist Level: Dependent - Patient 0%   Wheelchair 150 feet activity     Assist      Assist Level: Dependent - Patient 0%   Blood pressure 131/78, pulse 65, temperature 98 F (36.7 C), temperature source Oral, resp. rate 18, height 5\' 7"  (1.702 m), weight 90.6 kg, SpO2 93%.    Medical Problem List and Plan: 1. Functional deficits secondary to bilateral cerebellar infarct, right more than left secondary large vessel disease from severe posterior circulation stenosis             -patient may shower             -ELOS/Goals: 10-12,PT/OT sup to mod I             -Grounds pass ordered  -MRI repeat 5/17,  was discussed with neurology Dr. Bonnita Buttner , not acute cva but some progression of prior strokes. No additional changes, avoid hypotension   -Consult SLP- worsened dysarthria over past few days  - 5-19: Multiple messages from therapist and nursing concerns regarding worsening dysarthria, ataxia, and right facial sensory loss.  No new focal deficits on exam, however symptoms did not improve with medication adjustments.  Stat CT head without acute findings.  No hemorrhagic transformation or mass effect.  5/26-pt alert and appropriate today. -Continue CIR therapies including PT, OT, and SLP   Continue D3 2,000U daily  Discussed progress with therapy- he is improving in speech, standing, ability to use Provale cup independently  2.  Impaired mobility: continue Lovenox              -antiplatelet therapy: Aspirin  81 mg daily and Plavix  75 mg day x 3 months then Plavix   alone  3. Pain: Denies pain, will decrease Neurontin  to 200mg  HS  4. Mood/Behavior/Sleep: Provide emotional support             -antipsychotic agents: N/A 5. Neuropsych/cognition: This patient is capable of making decisions on his own behalf.  -5/18 start lexapro  for mood, consider neuropsych consult  5/19: DC lexapro  d/t worsening lethargy, dysarthria   6. Skin/Wound Care: Routine skin checks  5-19: Right upper extremity duplex ordered prior for swelling negative for DVT, however with multiple cystic structures concerning for abscess.  CT recommended for further assessment, order placed.  No current fevers or leukocytosis to indicate infection.  7. Fluids/Electrolytes/Nutrition: Routine in and outs with follow-up chemistries 8.  Klebsiella oxytoca UTI.  Completing course of Duricef  - Per urology note, is to continue on cefadroxil  1 tab p.o. nightly for CAUTI prophylaxis while Foley is in place  9.  CKD stage IV.  Discussed that creatinine is improving  -Cr overalls stable 2.47/BUN 50- monitor  5-26 Cr in range of recent labs 10.  Chronic combined systolic and diastolic congestive heart failure. EF 25-30% Monitor for any signs of fluid overload.  Follow-up cardiology services.  Continue Demadex  60 mg twice daily, weight reviewed and is increased, daily weights ordered, weights are stable/increased, continue current regimen  5/17 wt trending up although suspect may be inaccurate, no signs of overload noted  5/18 Wt appears stable, monitor  5/19: weights downtrending, ? Stroke recrudescence with normotension d/t stenosis, reduce torsemide  to 60 mg daily  5/26 sl downward trend still. Was eating well this morning -continue to encourage intake   Filed Weights   09/19/23 0432 09/20/23 0626 09/21/23 0700  Weight: 88.9 kg 89.2 kg 90.6 kg     11.  Nonischemic cardiomyopathy.  Followed by cardiology service Dr. Micael Adas.  Continue Imdur  30 mg daily and Coreg  6.25 mg twice daily.  See  #10.  12.  History of prostate cancer, BPH.  Followed by Dr. Freddi Jaeger.  Flomax  0.4 mg twice daily             -Check PVR/bladder scan  5/17-8 he has foley in placed, clear yellow urine  5/26 pt is emptying incontinently  13.  Diabetes mellitus with peripheral neuropathy and hemoglobin A1c 7.0.  Glucotrol  5 mg daily, d/c ISS, d/c semglee  CBG (last 3)  Recent Labs    09/20/23 1635 09/20/23 2050 09/21/23 0607  GLUCAP 94 128* 81     14. HTN. Long term goal normotensive. Home meds carvedilol , imdur , toresemide  -5/18 would like avoid hypotension, decrease Imdur  to 15 mg, decrease Coreg  to 3.125  twice daily, decrease torsemide  to daily  5/19: Weight down, BP normotensive but may be underperfusing; reduce torsemide  as above. Can consider reducing flomax  as well pending DC foley trial but cannot stay off long term d/t chronic retention.  5/26 bp controlled Vitals:   09/20/23 2021 09/21/23 0525  BP: 130/80 131/78  Pulse: 62 65  Resp: 18 18  Temp: 97.8 F (36.6 C) 98 F (36.7 C)  SpO2: 99% 93%      15. Obesity. Body mass index is 33.67 kg/m.             -dietary education  16. R wrist pain due to fall. Wearing wrist brace. Xray without fracture. Voltaren  gel scheduled             -tylenol  prn  -5/17 Will check vas US  due to R arm swelling  5-19: See above; no DVT, possible abscesses/cystic structures.    5/20: CT reviewed and shows age indeterminate fracture, will consult ortho  17. Constipation: D/c miralax , d/c [place], encouraged prune juice, asked nursing to bring prune juice for him  Last bowel movement 5/21  18. Dizziness/vertigo/nystagmus: vestibular eval ordered   Decrease meclizine  to daily given lethargy.  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  Scopolamine patch added  19. Anemia: Hgb reviewed and is slightly improved  20. AMS: MRI brain and CT Head obtained without acute concerning findings, have consulted neurology to review, does appear better  today  21. Drooling: scopolamine patch added, helping, continue  22. Lethargy: modafinil ordered, helping, continue  23. Penile swelling: placed order to d/c foley, improved  24. Type 7 stool: last BM 5/28, continue metamucil  25, Bradycardia: continue to monitor TID  26. Hypotension: weight reviewed and has increased to 196kg, continue torsemide  30mg   27. New visual deficits: CT head ordered, discussed with patient that this is stable     LOS: 16 days A FACE TO FACE EVALUATION WAS PERFORMED  Triniti Gruetzmacher P Joory Gough 09/21/2023, 9:30 AM

## 2023-09-21 NOTE — Plan of Care (Signed)
  Problem: Consults Goal: RH STROKE PATIENT EDUCATION Description: See Patient Education module for education specifics  Outcome: Progressing   Problem: RH BOWEL ELIMINATION Goal: RH STG MANAGE BOWEL WITH ASSISTANCE Description: STG Manage Bowel with supervision Assistance. Outcome: Progressing Goal: RH STG MANAGE BOWEL W/MEDICATION W/ASSISTANCE Description: STG Manage Bowel with Medication with Assistance. Outcome: Progressing   Problem: RH BLADDER ELIMINATION Goal: RH STG MANAGE BLADDER WITH ASSISTANCE Description: STG Manage Bladder With supervision Assistance Outcome: Progressing   Problem: RH SKIN INTEGRITY Goal: RH STG SKIN FREE OF INFECTION/BREAKDOWN Description: Manage skin free of infection/breakdown with supervision assistance Outcome: Progressing   Problem: RH SAFETY Goal: RH STG ADHERE TO SAFETY PRECAUTIONS W/ASSISTANCE/DEVICE Description: STG Adhere to Safety Precautions With supervision  Assistance/Device. Outcome: Progressing   Problem: RH COGNITION-NURSING Goal: RH STG USES MEMORY AIDS/STRATEGIES W/ASSIST TO PROBLEM SOLVE Description: STG Uses Memory Aids/Strategies With supervision Assistance to Problem Solve. Outcome: Progressing   Problem: RH PAIN MANAGEMENT Goal: RH STG PAIN MANAGED AT OR BELOW PT'S PAIN GOAL Description: <4 w/ prns Outcome: Progressing   Problem: RH KNOWLEDGE DEFICIT Goal: RH STG INCREASE KNOWLEDGE OF DIABETES Description: Manage knowledge of diabetes with supervision assistance from daughter using educational materials provided Outcome: Progressing Goal: RH STG INCREASE KNOWLEDGE OF HYPERTENSION Description: Manage knowledge of hypertension with supervision assistance from daughter using educational materials provided Outcome: Progressing Goal: RH STG INCREASE KNOWLEGDE OF HYPERLIPIDEMIA Description: Manage knowledge of hyperlipidemia with supervision assistance from daughter using educational materials provided Outcome:  Progressing Goal: RH STG INCREASE KNOWLEDGE OF STROKE PROPHYLAXIS Description: Manage knowledge of stroke prophylaxis with supervision assistance from daughter using educational materials provided Outcome: Progressing

## 2023-09-22 LAB — GLUCOSE, CAPILLARY
Glucose-Capillary: 88 mg/dL (ref 70–99)
Glucose-Capillary: 115 mg/dL — ABNORMAL HIGH (ref 70–99)
Glucose-Capillary: 146 mg/dL — ABNORMAL HIGH (ref 70–99)
Glucose-Capillary: 239 mg/dL — ABNORMAL HIGH (ref 70–99)

## 2023-09-22 MED ORDER — LOPERAMIDE HCL 2 MG PO CAPS: 2.0000 mg | ORAL_CAPSULE | ORAL | Status: DC | PRN

## 2023-09-22 NOTE — Progress Notes (Signed)
 PROGRESS NOTE   Subjective/Complaints:  No events overnight.  No acute complaints.  Patient relaxing in bedside chair. Vitals stable Last BM 5-31, liquid.  Incontinent of bladder.  Palliative care meeting yesterday, wishes for DNR with limited interventions and never be placed on life support,  would not want life-prolonging measures such as a feeding tube or artificial hydration.    ROS: Patient denies fever, rash, sore throat, blurred vision, dizziness, nausea, vomiting, diarrhea, cough, shortness of breath or chest pain, joint or back/neck pain, headache, or mood change. Drooling improved, cognition improved  Objective:   No results found.    No results for input(s): "WBC", "HGB", "HCT", "PLT" in the last 72 hours.   No results for input(s): "NA", "K", "CL", "CO2", "GLUCOSE", "BUN", "CREATININE", "CALCIUM " in the last 72 hours.    Intake/Output Summary (Last 24 hours) at 09/22/2023 0853 Last data filed at 09/22/2023 0751 Gross per 24 hour  Intake 357 ml  Output --  Net 357 ml        Physical Exam: Vital Signs Blood pressure 136/70, pulse 61, temperature 97.6 F (36.4 C), temperature source Oral, resp. rate 17, height 5\' 7"  (1.702 m), weight 87.8 kg, SpO2 98%.  Constitutional: No distress . Vital signs reviewed. sitting up in bedside chair  HEENT: NCAT, EOMI, oral membranes moist, visual deficits noted Neck: supple Cardiovascular: Bradycardia Respiratory/Chest: CTA Bilaterally without wheezes or rales. Normal effort    GI/Abdomen: BS +, non-tender, non-distended Ext: no clubbing, cyanosis, or edema Psych: pleasant and cooperative    Skin: intact; mild right upper extremity swelling/edema  Neuro:very dysarthric ataxic speech, improved conversation and awareness. Moves all 4's but does demonstrate ataxia   No tone or spasticity noted.  Cranial nerves grossly intact.   MSK: R  arm remains in splint,  stable 5/29  Functional mobility: using Provale cup independently    Physical exam unchanged from the above on reexamination 09/22/23      Assessment/Plan: 1. Functional deficits which require 3+ hours per day of interdisciplinary therapy in a comprehensive inpatient rehab setting. Physiatrist is providing close team supervision and 24 hour management of active medical problems listed below. Physiatrist and rehab team continue to assess barriers to discharge/monitor patient progress toward functional and medical goals  Care Tool:  Bathing    Body parts bathed by patient: Right arm, Chest, Abdomen, Face   Body parts bathed by helper: Right arm, Left arm, Chest, Abdomen, Front perineal area, Buttocks, Right upper leg, Left upper leg, Right lower leg, Left lower leg, Face     Bathing assist Assist Level: Total Assistance - Patient < 25%     Upper Body Dressing/Undressing Upper body dressing   What is the patient wearing?: Pull over shirt    Upper body assist Assist Level: Moderate Assistance - Patient 50 - 74%    Lower Body Dressing/Undressing Lower body dressing      What is the patient wearing?: Pants, Incontinence brief     Lower body assist Assist for lower body dressing: Total Assistance - Patient < 25%     Toileting Toileting Toileting Activity did not occur (Clothing management and hygiene only): N/A (no void or bm)  Toileting assist Assist for toileting: Total Assistance - Patient < 25%     Transfers Chair/bed transfer  Transfers assist     Chair/bed transfer assist level: Maximal Assistance - Patient 25 - 49%     Locomotion Ambulation   Ambulation assist   Ambulation activity did not occur: Safety/medical concerns  Assist level: Moderate Assistance - Patient 50 - 74% Assistive device: Hand held assist Max distance: 92ft   Walk 10 feet activity   Assist  Walk 10 feet activity did not occur: Safety/medical concerns  Assist level: Moderate  Assistance - Patient - 50 - 74% Assistive device: Hand held assist   Walk 50 feet activity   Assist Walk 50 feet with 2 turns activity did not occur: Safety/medical concerns  Assist level: Minimal Assistance - Patient > 75% Assistive device: Walker-Eva    Walk 150 feet activity   Assist Walk 150 feet activity did not occur: Safety/medical concerns  Assist level: Minimal Assistance - Patient > 75% Assistive device: Walker-Eva    Walk 10 feet on uneven surface  activity   Assist Walk 10 feet on uneven surfaces activity did not occur: Safety/medical concerns         Wheelchair     Assist Is the patient using a wheelchair?: Yes Type of Wheelchair: Manual    Wheelchair assist level: Dependent - Patient 0%      Wheelchair 50 feet with 2 turns activity    Assist        Assist Level: Dependent - Patient 0%   Wheelchair 150 feet activity     Assist      Assist Level: Dependent - Patient 0%   Blood pressure 136/70, pulse 61, temperature 97.6 F (36.4 C), temperature source Oral, resp. rate 17, height 5\' 7"  (1.702 m), weight 87.8 kg, SpO2 98%.    Medical Problem List and Plan: 1. Functional deficits secondary to bilateral cerebellar infarct, right more than left secondary large vessel disease from severe posterior circulation stenosis             -patient may shower             -ELOS/Goals: 10-12,PT/OT sup to mod I             -Grounds pass ordered  -MRI repeat 5/17, was discussed with neurology Dr. Bonnita Buttner , not acute cva but some progression of prior strokes. No additional changes, avoid hypotension   -Consult SLP- worsened dysarthria over past few days  - 5-19: Multiple messages from therapist and nursing concerns regarding worsening dysarthria, ataxia, and right facial sensory loss.  No new focal deficits on exam, however symptoms did not improve with medication adjustments.  Stat CT head without acute findings.  No hemorrhagic transformation or  mass effect.  5/26-pt alert and appropriate today. -Continue CIR therapies including PT, OT, and SLP   Continue D3 2,000U daily  Discussed progress with therapy- he is improving in speech, standing, ability to use Provale cup independently  5-31: See palliative care note for full rec; DNR/DNI with limited interventions.  2.  Impaired mobility: continue Lovenox              -antiplatelet therapy: Aspirin  81 mg daily and Plavix  75 mg day x 3 months then Plavix  alone  3. Pain: Denies pain, will decrease Neurontin  to 200mg  HS  4. Mood/Behavior/Sleep: Provide emotional support             -antipsychotic agents: N/A 5. Neuropsych/cognition: This patient is capable of making  decisions on his own behalf.  -5/18 start lexapro  for mood, consider neuropsych consult  5/19: DC lexapro  d/t worsening lethargy, dysarthria   6. Skin/Wound Care: Routine skin checks  5-19: Right upper extremity duplex ordered prior for swelling negative for DVT, however with multiple cystic structures concerning for abscess.  CT recommended for further assessment, order placed.  No current fevers or leukocytosis to indicate infection.  7. Fluids/Electrolytes/Nutrition: Routine in and outs with follow-up chemistries 8.  Klebsiella oxytoca UTI.  Completing course of Duricef  - Per urology note, is to continue on cefadroxil  1 tab p.o. nightly for CAUTI prophylaxis while Foley is in place  9.  CKD stage IV.  Discussed that creatinine is improving  -Cr overalls stable 2.47/BUN 50- monitor  5-26 Cr in range of recent labs  10.  Chronic combined systolic and diastolic congestive heart failure. EF 25-30% Monitor for any signs of fluid overload.  Follow-up cardiology services.  Continue Demadex  60 mg twice daily, weight reviewed and is increased, daily weights ordered, weights are stable/increased, continue current regimen  5/17 wt trending up although suspect may be inaccurate, no signs of overload noted  5/18 Wt appears stable,  monitor  5/19: weights downtrending, ? Stroke recrudescence with normotension d/t stenosis, reduce torsemide  to 60 mg daily  5/26 sl downward trend still. Was eating well this morning -continue to encourage intake  5/31: Weights down a little bit.  Monitor.  Filed Weights   09/20/23 0626 09/21/23 0700 09/22/23 0600  Weight: 89.2 kg 90.6 kg 87.8 kg     11.  Nonischemic cardiomyopathy.  Followed by cardiology service Dr. Micael Adas.  Continue Imdur  30 mg daily and Coreg  6.25 mg twice daily.  See #10.  12.  History of prostate cancer, BPH.  Followed by Dr. Freddi Jaeger.  Flomax  0.4 mg twice daily             -Check PVR/bladder scan  5/17-8 he has foley in placed, clear yellow urine  5/26 pt is emptying incontinently--continues  13.  Diabetes mellitus with peripheral neuropathy and hemoglobin A1c 7.0.  Glucotrol  5 mg daily, d/c ISS, d/c semglee   - Blood sugars well-controlled CBG (last 3)  Recent Labs    09/21/23 1643 09/21/23 2043 09/22/23 0608  GLUCAP 116* 191* 88     14. HTN. Long term goal normotensive. Home meds carvedilol , imdur , toresemide  -5/18 would like avoid hypotension, decrease Imdur  to 15 mg, decrease Coreg  to 3.125 twice daily, decrease torsemide  to daily  5/19: Weight down, BP normotensive but may be underperfusing; reduce torsemide  as above. Can consider reducing flomax  as well pending DC foley trial but cannot stay off long term d/t chronic retention.  5/26 bp controlled Vitals:   09/21/23 1945 09/22/23 0535  BP: 112/62 136/70  Pulse: 63 61  Resp: 18 17  Temp: 98.4 F (36.9 C) 97.6 F (36.4 C)  SpO2: 98% 98%      15. Obesity. Body mass index is 33.67 kg/m.             -dietary education  16. R wrist pain due to fall. Wearing wrist brace. Xray without fracture. Voltaren  gel scheduled             -tylenol  prn  -5/17 Will check vas US  due to R arm swelling  5-19: See above; no DVT, possible abscesses/cystic structures.    5/20: CT reviewed and shows age  indeterminate fracture, will consult ortho  17. Constipation: D/c miralax , d/c [place], encouraged prune juice, asked  nursing to bring prune juice for him  - resolved  18. Dizziness/vertigo/nystagmus: vestibular eval ordered   Decrease meclizine  to daily given lethargy.  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  Scopolamine  patch added  19. Anemia: Hgb reviewed and is slightly improved  20. AMS: MRI brain and CT Head obtained without acute concerning findings, have consulted neurology to review, does appear better today  21. Drooling: scopolamine  patch added, helping, continue  22. Lethargy: modafinil  ordered, helping, continue  23. Penile swelling: placed order to d/c foley, improved  24. Type 7 stool: last BM 5/28, continue metamucil  Last bowel movement 5/31, liquid.   5-31: Having type VI to type VII bowel movements daily.  Add Imodium as needed.  Continue Metamucil.  25, Bradycardia: continue to monitor TID  26. Hypotension: weight reviewed and has increased to 196kg, continue torsemide  30mg   - See #10, #11, #14   27. New visual deficits: CT head ordered, discussed with patient that this is stable     LOS: 17 days A FACE TO FACE EVALUATION WAS PERFORMED  Bea Lime 09/22/2023, 8:53 AM

## 2023-09-22 NOTE — Plan of Care (Signed)
  Problem: Consults Goal: RH STROKE PATIENT EDUCATION Description: See Patient Education module for education specifics  Outcome: Progressing   Problem: RH BOWEL ELIMINATION Goal: RH STG MANAGE BOWEL WITH ASSISTANCE Description: STG Manage Bowel with supervision Assistance. Outcome: Progressing Goal: RH STG MANAGE BOWEL W/MEDICATION W/ASSISTANCE Description: STG Manage Bowel with Medication with Assistance. Outcome: Progressing   Problem: RH BLADDER ELIMINATION Goal: RH STG MANAGE BLADDER WITH ASSISTANCE Description: STG Manage Bladder With supervision Assistance Outcome: Progressing   Problem: RH SKIN INTEGRITY Goal: RH STG SKIN FREE OF INFECTION/BREAKDOWN Description: Manage skin free of infection/breakdown with supervision assistance Outcome: Progressing   Problem: RH SAFETY Goal: RH STG ADHERE TO SAFETY PRECAUTIONS W/ASSISTANCE/DEVICE Description: STG Adhere to Safety Precautions With supervision  Assistance/Device. Outcome: Progressing   Problem: RH COGNITION-NURSING Goal: RH STG USES MEMORY AIDS/STRATEGIES W/ASSIST TO PROBLEM SOLVE Description: STG Uses Memory Aids/Strategies With supervision Assistance to Problem Solve. Outcome: Progressing   Problem: RH PAIN MANAGEMENT Goal: RH STG PAIN MANAGED AT OR BELOW PT'S PAIN GOAL Description: <4 w/ prns Outcome: Progressing   Problem: RH KNOWLEDGE DEFICIT Goal: RH STG INCREASE KNOWLEDGE OF DIABETES Description: Manage knowledge of diabetes with supervision assistance from daughter using educational materials provided Outcome: Progressing Goal: RH STG INCREASE KNOWLEDGE OF HYPERTENSION Description: Manage knowledge of hypertension with supervision assistance from daughter using educational materials provided Outcome: Progressing Goal: RH STG INCREASE KNOWLEGDE OF HYPERLIPIDEMIA Description: Manage knowledge of hyperlipidemia with supervision assistance from daughter using educational materials provided Outcome:  Progressing Goal: RH STG INCREASE KNOWLEDGE OF STROKE PROPHYLAXIS Description: Manage knowledge of stroke prophylaxis with supervision assistance from daughter using educational materials provided Outcome: Progressing

## 2023-09-22 NOTE — Progress Notes (Signed)
 Palliative Care Progress Note, Assessment & Plan   Patient Name: Allen Dyer       Date: 09/22/2023 DOB: 08/20/1935  Age: 88 y.o. MRN#: [id number] Attending Physician: Liam Redhead, MD Primary Care Physician: Lanae Pinal, MD Admit Date: 09/05/2023  Subjective: Patient is out of bed and sitting in recliner with his legs up.  He acknowledges my presence and is able to make his wishes known.  No family or friends present during my visit.  HPI: 88 y.o. male  with past medical history of systolic and diastolic heart failure, nonischemic cardiomyopathy, CVA, prostate cancer, BPH, GERD, gout, multiple myeloma, nonobstructive CAD, HLD, HTN, type 2 diabetes, and peripheral neuropathy admitted on 09/05/2023 with dizziness with a fall at home and right wrist injury.  CT angiogram revealed severe posterior circulation disease with right more than left cerebellar hemisphere subacute ischemic infarcts.  Neurology was following.  Patient admitted to [facility] inpatient rehab for comprehensive rehab program.   PMT was consulted to support patient and family with goals of care discussions.   Summary of counseling/coordination of care: Extensive chart review completed prior to meeting patient including labs, vital signs, imaging, progress notes, orders, and available advanced directive documents from current and previous encounters.   After reviewing the patient's chart and assessing the patient at bedside, I spoke with patient in regards to symptom management and goals of care.   Symptoms assessed.  Patient was having difficulty drinking from hisprovale cup. Adjusted lid and patient was able to use it successfully independently.  Patient denies pain, discomfort, or other acute issues at this time.  We discussed his  drooling.  He endorses it is getting better but still a "nuisance" to him.  Discussed scopolamine  patch remains in continued exercise of patient's mouth, lips, and tongue's will continue to improve drooling issue.  No adjustment to [facility] needed at this time.  I again highlighted boundaries and goals of care with patient.  He remains in agreement with DNR with limited interventions.  Copy of MOST form remains at bedside, not yet completed.  I encouraged patient to continue discussions with his daughter ensuring that she knows his wishes/boundaries of care.  Patient has low symptom burden.  Plan remains for him to complete time at CIR and to return home with care of patient's daughter [name].  No acute palliative needs at this time.  PMT will step back from daily visits and monitor the patient peripherally.  Please reengage with PMT if goals change, at patient/family's request, or if patient's health deteriorates during hospitalization.  Patient and family have PMT contact info and were encouraged to call with any acute palliative issues during this hospitalization.  Physical Exam Vitals reviewed.  Constitutional:      General: He is not in acute distress.    Appearance: He is normal weight.  HENT:     Head: Normocephalic.     Mouth/Throat:     Mouth: Mucous membranes are moist.     Comments: Drooling noted Abdominal:     Palpations: Abdomen is soft.  Musculoskeletal:     Comments: Bandage to right arm Generalized weakness MAETC  Skin:    General: Skin is warm and dry.  Neurological:     Mental Status: He is alert and oriented to person, place, and time.  Psychiatric:        Mood and Affect: Mood normal.        Behavior: Behavior normal.        Judgment: Judgment normal.             Total Time 25 minutes   Time spent includes: Detailed review of medical records (labs, imaging, vital signs), medically appropriate exam (mental status, respiratory, cardiac, skin), discussed with  treatment team, counseling and educating patient, family and staff, documenting clinical information, medication management and coordination of care.  Judeen Nose L. Rebbeca Campi, DNP, FNP-BC Palliative Medicine Team

## 2023-09-23 LAB — GLUCOSE, CAPILLARY
Glucose-Capillary: 78 mg/dL (ref 70–99)
Glucose-Capillary: 97 mg/dL (ref 70–99)
Glucose-Capillary: 164 mg/dL — ABNORMAL HIGH (ref 70–99)
Glucose-Capillary: 113 mg/dL — ABNORMAL HIGH (ref 70–99)

## 2023-09-23 MED ORDER — TORSEMIDE 20 MG PO TABS: 20.0000 mg | ORAL_TABLET | Freq: Every day | ORAL | Status: DC

## 2023-09-23 MED ORDER — TORSEMIDE 20 MG PO TABS
30.0000 mg | ORAL_TABLET | Freq: Every day | ORAL | Status: DC
  Administered 2023-09-24 – 2023-09-27 (×4): 30 mg via ORAL
  Filled 2023-09-23 (×4): qty 2

## 2023-09-23 NOTE — Progress Notes (Signed)
Patient placed himself on home CPAP for the night.  

## 2023-09-23 NOTE — Progress Notes (Signed)
 PROGRESS NOTE   Subjective/Complaints:  No events overnight.  No acute complaints.  BP soft but stable; patient asymptomatic States his drooling is much improved since starting Scopolamine  LBM 5/31  ROS: Patient denies fever, rash, sore throat, blurred vision, dizziness, nausea, vomiting, diarrhea, cough, shortness of breath or chest pain, joint or back/neck pain, headache, or mood change. Drooling improved, cognition improved  Objective:   No results found.    No results for input(s): "WBC", "HGB", "HCT", "PLT" in the last 72 hours.   No results for input(s): "NA", "K", "CL", "CO2", "GLUCOSE", "BUN", "CREATININE", "CALCIUM " in the last 72 hours.    Intake/Output Summary (Last 24 hours) at 09/23/2023 0912 Last data filed at 09/23/2023 0901 Gross per 24 hour  Intake 591 ml  Output --  Net 591 ml        Physical Exam: Vital Signs Blood pressure (!) 115/93, pulse 65, temperature 97.6 F (36.4 C), temperature source Oral, resp. rate 17, height 5\' 7"  (1.702 m), weight 90.6 kg, SpO2 100%.  Constitutional: No distress . Vital signs reviewed.  Laying in bed.  HEENT: NCAT, EOMI, oral membranes moist, visual deficits noted Neck: supple Cardiovascular: Bradycardia Respiratory/Chest: CTA Bilaterally without wheezes or rales. Normal effort    GI/Abdomen: BS +, non-tender, non-distended Ext: no clubbing, cyanosis, or edema Psych: pleasant and cooperative    Skin: intact; mild right upper extremity swelling/edema  Neuro:very dysarthric ataxic speech, improved conversation and awareness. Moves all 4's but does demonstrate ataxia   No tone or spasticity noted.  Cranial nerves grossly intact.   MSK: R  arm remains in splint, stable 5/29  Functional mobility: using Provale cup independently    Physical exam unchanged from the above on reexamination 09/23/23      Assessment/Plan: 1. Functional deficits which require  3+ hours per day of interdisciplinary therapy in a comprehensive inpatient rehab setting. Physiatrist is providing close team supervision and 24 hour management of active medical problems listed below. Physiatrist and rehab team continue to assess barriers to discharge/monitor patient progress toward functional and medical goals  Care Tool:  Bathing    Body parts bathed by patient: Right arm, Chest, Abdomen, Face   Body parts bathed by helper: Right arm, Left arm, Chest, Abdomen, Front perineal area, Buttocks, Right upper leg, Left upper leg, Right lower leg, Left lower leg, Face     Bathing assist Assist Level: Total Assistance - Patient < 25%     Upper Body Dressing/Undressing Upper body dressing   What is the patient wearing?: Pull over shirt    Upper body assist Assist Level: Moderate Assistance - Patient 50 - 74%    Lower Body Dressing/Undressing Lower body dressing      What is the patient wearing?: Pants, Incontinence brief     Lower body assist Assist for lower body dressing: Total Assistance - Patient < 25%     Toileting Toileting Toileting Activity did not occur (Clothing management and hygiene only): N/A (no void or bm)  Toileting assist Assist for toileting: Total Assistance - Patient < 25%     Transfers Chair/bed transfer  Transfers assist     Chair/bed transfer assist level: Maximal Assistance -  Patient 25 - 49%     Locomotion Ambulation   Ambulation assist   Ambulation activity did not occur: Safety/medical concerns  Assist level: Moderate Assistance - Patient 50 - 74% Assistive device: Hand held assist Max distance: 11ft   Walk 10 feet activity   Assist  Walk 10 feet activity did not occur: Safety/medical concerns  Assist level: Moderate Assistance - Patient - 50 - 74% Assistive device: Hand held assist   Walk 50 feet activity   Assist Walk 50 feet with 2 turns activity did not occur: Safety/medical concerns  Assist level:  Minimal Assistance - Patient > 75% Assistive device: Walker-Eva    Walk 150 feet activity   Assist Walk 150 feet activity did not occur: Safety/medical concerns  Assist level: Minimal Assistance - Patient > 75% Assistive device: Walker-Eva    Walk 10 feet on uneven surface  activity   Assist Walk 10 feet on uneven surfaces activity did not occur: Safety/medical concerns         Wheelchair     Assist Is the patient using a wheelchair?: Yes Type of Wheelchair: Manual    Wheelchair assist level: Dependent - Patient 0%      Wheelchair 50 feet with 2 turns activity    Assist        Assist Level: Dependent - Patient 0%   Wheelchair 150 feet activity     Assist      Assist Level: Dependent - Patient 0%   Blood pressure (!) 115/93, pulse 65, temperature 97.6 F (36.4 C), temperature source Oral, resp. rate 17, height 5\' 7"  (1.702 m), weight 90.6 kg, SpO2 100%.    Medical Problem List and Plan: 1. Functional deficits secondary to bilateral cerebellar infarct, right more than left secondary large vessel disease from severe posterior circulation stenosis             -patient may shower             -ELOS/Goals: 10-12,PT/OT sup to mod I             -Grounds pass ordered  -MRI repeat 5/17, was discussed with neurology Dr. Bonnita Buttner , not acute cva but some progression of prior strokes. No additional changes, avoid hypotension   -Consult SLP- worsened dysarthria over past few days  - 5-19: Multiple messages from therapist and nursing concerns regarding worsening dysarthria, ataxia, and right facial sensory loss.  No new focal deficits on exam, however symptoms did not improve with medication adjustments.  Stat CT head without acute findings.  No hemorrhagic transformation or mass effect.  5/26-pt alert and appropriate today. -Continue CIR therapies including PT, OT, and SLP   Continue D3 2,000U daily  Discussed progress with therapy- he is improving in speech,  standing, ability to use Provale cup independently  5-31: See palliative care note for full rec; DNR/DNI with limited interventions.  2.  Impaired mobility: continue Lovenox              -antiplatelet therapy: Aspirin  81 mg daily and Plavix  75 mg day x 3 months then Plavix  alone  3. Pain: Denies pain, will decrease Neurontin  to 200mg  HS  4. Mood/Behavior/Sleep: Provide emotional support             -antipsychotic agents: N/A 5. Neuropsych/cognition: This patient is capable of making decisions on his own behalf.  -5/18 start lexapro  for mood, consider neuropsych consult  5/19: DC lexapro  d/t worsening lethargy, dysarthria   6. Skin/Wound Care: Routine skin checks  5-19: Right upper extremity duplex ordered prior for swelling negative for DVT, however with multiple cystic structures concerning for abscess.  CT recommended for further assessment, order placed.  No current fevers or leukocytosis to indicate infection.  7. Fluids/Electrolytes/Nutrition: Routine in and outs with follow-up chemistries 8.  Klebsiella oxytoca UTI.  Completing course of Duricef  - Per urology note, is to continue on cefadroxil  1 tab p.o. nightly for CAUTI prophylaxis while Foley is in place  9.  CKD stage IV.  Discussed that creatinine is improving  -Cr overalls stable 2.47/BUN 50- monitor  5-26 Cr in range of recent labs  10.  Chronic combined systolic and diastolic congestive heart failure. EF 25-30% Monitor for any signs of fluid overload.  Follow-up cardiology services.  Continue Demadex  60 mg twice daily, weight reviewed and is increased, daily weights ordered, weights are stable/increased, continue current regimen  5/17 wt trending up although suspect may be inaccurate, no signs of overload noted  5/18 Wt appears stable, monitor  5/19: weights downtrending, ? Stroke recrudescence with normotension d/t stenosis, reduce torsemide  to 60 mg daily  5/26 sl downward trend still. Was eating well this  morning -continue to encourage intake  5/31: Weights down a little bit.  Monitor. 6/1: Weights likely inaccurate - appears euvolemic  Filed Weights   09/21/23 0700 09/22/23 0600 09/23/23 0553  Weight: 90.6 kg 87.8 kg 90.6 kg     11.  Nonischemic cardiomyopathy.  Followed by cardiology service Dr. Micael Adas.  Continue Imdur  30 mg daily and Coreg  6.25 mg twice daily.  See #10.  12.  History of prostate cancer, BPH.  Followed by Dr. Freddi Jaeger.  Flomax  0.4 mg twice daily             -Check PVR/bladder scan  5/17-8 he has foley in placed, clear yellow urine  5/26 pt is emptying incontinently--continues  13.  Diabetes mellitus with peripheral neuropathy and hemoglobin A1c 7.0.  Glucotrol  5 mg daily, d/c ISS, d/c semglee   - Blood sugars well-controlled CBG (last 3)  Recent Labs    09/22/23 1618 09/22/23 2037 09/23/23 0604  GLUCAP 146* 239* 78     14. HTN. Long term goal normotensive. Home meds carvedilol , imdur , toresemide  -5/18 would like avoid hypotension, decrease Imdur  to 15 mg, decrease Coreg  to 3.125 twice daily, decrease torsemide  to daily  5/19: Weight down, BP normotensive but may be underperfusing; reduce torsemide  as above. Can consider reducing flomax  as well pending DC foley trial but cannot stay off long term d/t chronic retention.  5/26 bp controlled  6/1: A little soft ,back up this AM, monitor and consider reducing torsemide    Vitals:   09/22/23 1929 09/23/23 0553  BP: (!) 148/74 (!) 115/93  Pulse: 73 65  Resp: 18 17  Temp: 98.4 F (36.9 C) 97.6 F (36.4 C)  SpO2: 100% 100%      15. Obesity. Body mass index is 33.67 kg/m.             -dietary education  16. R wrist pain due to fall. Wearing wrist brace. Xray without fracture. Voltaren  gel scheduled             -tylenol  prn  -5/17 Will check vas US  due to R arm swelling  5-19: See above; no DVT, possible abscesses/cystic structures.    5/20: CT reviewed and shows age indeterminate fracture, will consult  ortho  17. Constipation: D/c miralax , d/c Senakot, encouraged prune juice, asked nursing to bring prune juice for  him  - resolved  18. Dizziness/vertigo/nystagmus: vestibular eval ordered   Decrease meclizine  to daily given lethargy.  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  Scopolamine  patch added  19. Anemia: Hgb reviewed and is slightly improved  20. AMS: MRI brain and CT Head obtained without acute concerning findings, have consulted neurology to review, does appear better today  21. Drooling: scopolamine  patch added, helping, continue  22. Lethargy: modafinil  ordered, helping, continue  23. Penile swelling: placed order to d/c foley, improved  24. Type 7 stool: last BM 5/28, continue metamucil  Last bowel movement 5/31, liquid.   5-31: Having type VI to type VII bowel movements daily.  Add Imodium as needed.  Continue Metamucil. LBM 5/31  25, Bradycardia: continue to monitor TID  26. Hypotension: weight reviewed and has increased to 196kg, continue torsemide  30mg   - See #10, #11, #14   27. New visual deficits: CT head ordered, discussed with patient that this is stable   28. Drooling.    - Scopalamine 1.5 mg patch   - May benefit from OP botox or addition of PRN robinol; defer to primary    LOS: 18 days A FACE TO FACE EVALUATION WAS PERFORMED  Bea Lime 09/23/2023, 9:12 AM

## 2023-09-24 LAB — GLUCOSE, CAPILLARY
Glucose-Capillary: 91 mg/dL (ref 70–99)
Glucose-Capillary: 146 mg/dL — ABNORMAL HIGH (ref 70–99)
Glucose-Capillary: 197 mg/dL — ABNORMAL HIGH (ref 70–99)
Glucose-Capillary: 122 mg/dL — ABNORMAL HIGH (ref 70–99)

## 2023-09-24 MED ORDER — DOCUSATE SODIUM 100 MG PO CAPS
100.0000 mg | ORAL_CAPSULE | Freq: Every day | ORAL | Status: DC
  Administered 2023-09-24 – 2023-09-27 (×4): 100 mg via ORAL
  Filled 2023-09-24 (×4): qty 1

## 2023-09-24 NOTE — Progress Notes (Signed)
 Nurse put on CPAP at 1100pm

## 2023-09-24 NOTE — Plan of Care (Signed)
  Problem: Consults Goal: RH STROKE PATIENT EDUCATION Description: See Patient Education module for education specifics  Outcome: Progressing   Problem: RH BOWEL ELIMINATION Goal: RH STG MANAGE BOWEL WITH ASSISTANCE Description: STG Manage Bowel with supervision Assistance. Outcome: Progressing Goal: RH STG MANAGE BOWEL W/MEDICATION W/ASSISTANCE Description: STG Manage Bowel with Medication with Assistance. Outcome: Progressing   Problem: RH BLADDER ELIMINATION Goal: RH STG MANAGE BLADDER WITH ASSISTANCE Description: STG Manage Bladder With supervision Assistance Outcome: Progressing   Problem: RH SKIN INTEGRITY Goal: RH STG SKIN FREE OF INFECTION/BREAKDOWN Description: Manage skin free of infection/breakdown with supervision assistance Outcome: Progressing   Problem: RH SAFETY Goal: RH STG ADHERE TO SAFETY PRECAUTIONS W/ASSISTANCE/DEVICE Description: STG Adhere to Safety Precautions With supervision  Assistance/Device. Outcome: Progressing   Problem: RH COGNITION-NURSING Goal: RH STG USES MEMORY AIDS/STRATEGIES W/ASSIST TO PROBLEM SOLVE Description: STG Uses Memory Aids/Strategies With supervision Assistance to Problem Solve. Outcome: Progressing   Problem: RH PAIN MANAGEMENT Goal: RH STG PAIN MANAGED AT OR BELOW PT'S PAIN GOAL Description: <4 w/ prns Outcome: Progressing   Problem: RH KNOWLEDGE DEFICIT Goal: RH STG INCREASE KNOWLEDGE OF DIABETES Description: Manage knowledge of diabetes with supervision assistance from daughter using educational materials provided Outcome: Progressing Goal: RH STG INCREASE KNOWLEDGE OF HYPERTENSION Description: Manage knowledge of hypertension with supervision assistance from daughter using educational materials provided Outcome: Progressing Goal: RH STG INCREASE KNOWLEGDE OF HYPERLIPIDEMIA Description: Manage knowledge of hyperlipidemia with supervision assistance from daughter using educational materials provided Outcome:  Progressing Goal: RH STG INCREASE KNOWLEDGE OF STROKE PROPHYLAXIS Description: Manage knowledge of stroke prophylaxis with supervision assistance from daughter using educational materials provided Outcome: Progressing

## 2023-09-24 NOTE — Progress Notes (Addendum)
 PROGRESS NOTE   Subjective/Complaints:  No new complaints this morning Standing in [place] with Miki Alert OT in shower stall Patient's chart reviewed- No issues reported overnight Vitals signs stable   ROS: Patient denies fever, rash, sore throat, blurred vision, dizziness, nausea, vomiting, diarrhea, cough, shortness of breath or chest pain, joint or back/neck pain, headache, or mood change. Drooling improved, cognition improved, strength improving  Objective:   No results found.    No results for input(s): "WBC", "HGB", "HCT", "PLT" in the last 72 hours.   No results for input(s): "NA", "K", "CL", "CO2", "GLUCOSE", "BUN", "CREATININE", "CALCIUM " in the last 72 hours.    Intake/Output Summary (Last 24 hours) at 09/24/2023 0938 Last data filed at 09/24/2023 0834 Gross per 24 hour  Intake 436 ml  Output --  Net 436 ml        Physical Exam: Vital Signs Blood pressure 135/62, pulse 70, temperature 98.4 F (36.9 C), temperature source Oral, resp. rate 17, height 5\' 7"  (1.702 m), weight 88.1 kg, SpO2 98%.  Constitutional: No distress . Vital signs reviewed.  Laying in bed.  HEENT: NCAT, EOMI, oral membranes moist, visual deficits noted Neck: supple Cardiovascular: Bradycardia Respiratory/Chest: CTA Bilaterally without wheezes or rales. Normal effort    GI/Abdomen: BS +, non-tender, non-distended Ext: no clubbing, cyanosis, or edema Psych: pleasant and cooperative    Skin: intact; mild right upper extremity swelling/edema  Neuro:very dysarthric ataxic speech, improved conversation and awareness. Moves all 4's but does demonstrate ataxia   No tone or spasticity noted.  Cranial nerves grossly intact.   MSK: R  arm remains in splint  Functional mobility: standing in [place] with OT Miki Alert in shower stall    Physical exam unchanged from the above on reexamination 09/24/23      Assessment/Plan: 1. Functional deficits  which require 3+ hours per day of interdisciplinary therapy in a comprehensive inpatient rehab setting. Physiatrist is providing close team supervision and 24 hour management of active medical problems listed below. Physiatrist and rehab team continue to assess barriers to discharge/monitor patient progress toward functional and medical goals  Care Tool:  Bathing    Body parts bathed by patient: Right arm, Chest, Abdomen, Face   Body parts bathed by helper: Right arm, Left arm, Chest, Abdomen, Front perineal area, Buttocks, Right upper leg, Left upper leg, Right lower leg, Left lower leg, Face     Bathing assist Assist Level: Total Assistance - Patient < 25%     Upper Body Dressing/Undressing Upper body dressing   What is the patient wearing?: Pull over shirt    Upper body assist Assist Level: Moderate Assistance - Patient 50 - 74%    Lower Body Dressing/Undressing Lower body dressing      What is the patient wearing?: Pants, Incontinence brief     Lower body assist Assist for lower body dressing: Total Assistance - Patient < 25%     Toileting Toileting Toileting Activity did not occur (Clothing management and hygiene only): N/A (no void or bm)  Toileting assist Assist for toileting: Total Assistance - Patient < 25%     Transfers Chair/bed transfer  Transfers assist     Chair/bed transfer  assist level: Maximal Assistance - Patient 25 - 49%     Locomotion Ambulation   Ambulation assist   Ambulation activity did not occur: Safety/medical concerns  Assist level: Moderate Assistance - Patient 50 - 74% Assistive device: Hand held assist Max distance: 6ft   Walk 10 feet activity   Assist  Walk 10 feet activity did not occur: Safety/medical concerns  Assist level: Moderate Assistance - Patient - 50 - 74% Assistive device: Hand held assist   Walk 50 feet activity   Assist Walk 50 feet with 2 turns activity did not occur: Safety/medical  concerns  Assist level: Minimal Assistance - Patient > 75% Assistive device: Walker-Eva    Walk 150 feet activity   Assist Walk 150 feet activity did not occur: Safety/medical concerns  Assist level: Minimal Assistance - Patient > 75% Assistive device: Walker-Eva    Walk 10 feet on uneven surface  activity   Assist Walk 10 feet on uneven surfaces activity did not occur: Safety/medical concerns         Wheelchair     Assist Is the patient using a wheelchair?: Yes Type of Wheelchair: Manual    Wheelchair assist level: Dependent - Patient 0%      Wheelchair 50 feet with 2 turns activity    Assist        Assist Level: Dependent - Patient 0%   Wheelchair 150 feet activity     Assist      Assist Level: Dependent - Patient 0%   Blood pressure 135/62, pulse 70, temperature 98.4 F (36.9 C), temperature source Oral, resp. rate 17, height 5\' 7"  (1.702 m), weight 88.1 kg, SpO2 98%.    Medical Problem List and Plan: 1. Functional deficits secondary to bilateral cerebellar infarct, right more than left secondary large vessel disease from severe posterior circulation stenosis             -patient may shower             -ELOS/Goals: 10-12,PT/OT sup to mod I             -Grounds pass ordered  -MRI repeat 5/17, was discussed with neurology Dr. Bonnita Buttner , not acute cva but some progression of prior strokes. No additional changes, avoid hypotension   -Consult SLP- worsened dysarthria over past few days  - 5-19: Multiple messages from therapist and nursing concerns regarding worsening dysarthria, ataxia, and right facial sensory loss.  No new focal deficits on exam, however symptoms did not improve with medication adjustments.  Stat CT head without acute findings.  No hemorrhagic transformation or mass effect.  5/26-pt alert and appropriate today. -Continue CIR therapies including PT, OT, and SLP   Continue D3 2,000U daily  Discussed progress with therapy- he is  improving in speech, standing, ability to use Provale cup independently  5-31: See palliative care note for full rec; DNR/DNI with limited interventions.  2.  Impaired mobility: continue Lovenox              -antiplatelet therapy: Aspirin  81 mg daily and Plavix  75 mg day x 3 months then Plavix  alone  3. Pain: Denies pain, will decrease Neurontin  to 200mg  HS  4. Mood/Behavior/Sleep: Provide emotional support             -antipsychotic agents: N/A 5. Neuropsych/cognition: This patient is capable of making decisions on his own behalf.  -5/18 start lexapro  for mood, consider neuropsych consult  5/19: DC lexapro  d/t worsening lethargy, dysarthria   6. Skin/Wound Care:  Routine skin checks  5-19: Right upper extremity duplex ordered prior for swelling negative for DVT, however with multiple cystic structures concerning for abscess.  CT recommended for further assessment, order placed.  No current fevers or leukocytosis to indicate infection.  7. Fluids/Electrolytes/Nutrition: Routine in and outs with follow-up chemistries 8.  Klebsiella oxytoca UTI.  Completing course of Duricef  - Per urology note, is to continue on cefadroxil  1 tab p.o. nightly for CAUTI prophylaxis while Foley is in place  9.  CKD stage IV.  Discussed that creatinine is improving  -Cr overalls stable 2.47/BUN 50- monitor  5-26 Cr in range of recent labs  10.  Chronic combined systolic and diastolic congestive heart failure. EF 25-30% Monitor for any signs of fluid overload.  Follow-up cardiology services.  Continue Demadex  60 mg twice daily, weight reviewed and is increased, daily weights ordered, weights are stable/increased, continue current regimen  5/17 wt trending up although suspect may be inaccurate, no signs of overload noted  5/18 Wt appears stable, monitor  5/19: weights downtrending, ? Stroke recrudescence with normotension d/t stenosis, reduce torsemide  to 60 mg daily  5/26 sl downward trend still. Was eating well  this morning -continue to encourage intake  5/31: Weights down a little bit.  Monitor. 6/1: Weights likely inaccurate - appears euvolemic  Filed Weights   09/22/23 0600 09/23/23 0553 09/24/23 0448  Weight: 87.8 kg 90.6 kg 88.1 kg     11.  Nonischemic cardiomyopathy.  Followed by cardiology service Dr. Micael Adas.  Continue Imdur  30 mg daily and Coreg  6.25 mg twice daily.  See #10.  12.  History of prostate cancer, BPH.  Followed by Dr. Freddi Jaeger.  Flomax  0.4 mg twice daily             -Check PVR/bladder scan  5/17-8 he has foley in placed, clear yellow urine  5/26 pt is emptying incontinently--continues  13.  Diabetes mellitus with peripheral neuropathy and hemoglobin A1c 7.0.  Glucotrol  5 mg daily, d/c ISS, d/c semglee   - Blood sugars well-controlled CBG (last 3)  Recent Labs    09/23/23 1643 09/23/23 2208 09/24/23 0622  GLUCAP 164* 113* 91     14. HTN. Long term goal normotensive. Home meds carvedilol , imdur , toresemide  -5/18 would like avoid hypotension, decrease Imdur  to 15 mg, decrease Coreg  to 3.125 twice daily, decrease torsemide  to daily  5/19: Weight down, BP normotensive but may be underperfusing; reduce torsemide  as above. Can consider reducing flomax  as well pending DC foley trial but cannot stay off long term d/t chronic retention.  5/26 bp controlled  6/1: A little soft ,back up this AM, monitor and consider reducing torsemide    Vitals:   09/23/23 1831 09/24/23 0448  BP: 134/80 135/62  Pulse: 66 70  Resp: 20 17  Temp: 98.4 F (36.9 C) 98.4 F (36.9 C)  SpO2: 100% 98%      15. Obesity. Body mass index is 33.67 kg/m.             -dietary education  16. R wrist pain due to fall. Wearing wrist brace. Xray without fracture. Voltaren  gel scheduled             -tylenol  prn  -5/17 Will check vas US  due to R arm swelling  5-19: See above; no DVT, possible abscesses/cystic structures.    5/20: CT reviewed and shows age indeterminate fracture, will consult  ortho  17. Constipation: D/c miralax , d/c Senakot, encouraged prune juice, asked nursing to bring prune  juice for him  - resolved  18. Dizziness/vertigo/nystagmus: much improved, continue scopolamine  patch  19. Anemia: Hgb reviewed and was stable last check, no new concerning findings to warrant repeat at this time  20. AMS: resolved  21. Drooling.    - Continue Scopalamine 1.5 mg patch  22. Lethargy: modafinil  ordered, helping, continue  23. Penile swelling: placed order to d/c foley, improved  24. Constipation: d/c metamucil, d/c imodium, added colace daily, asked nursing to bring him prune juice  25, Bradycardia: resolved  26. Hypotension: resolved  27. Post-stroke visual deficits: continue to monitor        LOS: 19 days A FACE TO FACE EVALUATION WAS PERFORMED  Allen Dyer 09/24/2023, 9:38 AM

## 2023-09-24 NOTE — Progress Notes (Signed)
 Speech Language Pathology Daily Session Note  Patient Details  Name: Allen Dyer MRN: [id number] Date of Birth: [birth year]  Today's Date: 09/24/2023 SLP Individual Time: 1348-1450 SLP Individual Time Calculation (min): 62 min  Short Term Goals: Week 2: SLP Short Term Goal 1 (Week 2): Patient will tolerate least restrictive diet across a meal with no more than 1 overt clinical instance of suspected aspiration given min cues for use of swallow compensatory strategies. SLP Short Term Goal 2 (Week 2): Patient will orient to date utilizing external aids when provided with min verbal cues. SLP Short Term Goal 3 (Week 2): Patient will selectively attend to speech therapy tasks for 10 minute intervals given min assist. SLP Short Term Goal 4 (Week 2): Patient will recall pertinent daily information with 80% accuracy utilizing external/internal aids when given min assist. SLP Short Term Goal 5 (Week 2): Patient will solve basic environmental problems with 80% accuracy given min assist. SLP Short Term Goal 6 (Week 2): Patient will utilize speech intelligibility and communication repair strategies in 8/10 opportunities at the phrase level given min assist.  Skilled Therapeutic Interventions:  Patient was seen in PM to address cognitive re- training and speech intelligibility. Pt was alert and seated upright in WC upon SLP arrival. Both son and dtr in law present initially however leaving shortly after SLP arrival. Pt's dtr inquiring about f/u SLP services post discharge with SLP confirming recommendations to continue [facility] and/ or OP SLP.  Pt indep oriented to temporal concepts this dates. He recalled events of day including shower with OT and ambulating with PT. He further recalled lunch meal and conversation with dtr and son in law. SLP challenged pt's spontaneous speech this date through life review. Prior to guided conversation, pt recalled SLOP speech strategies with min A. Pt verbalized responses to open  ended questions ranging from word to sentence level maintaining ~75- 80% intelligibility given known context. Pt able to repair communication breakdowns when clarification requested by use of over articulating and/or slowing speech given sup to min A. At conclusion of session, pt was left upright in [facility] with call button within reach and chair alarm active. SLP to continue POC.   Pain Pain Assessment Pain Scale: 0-10 Pain Score: 0-No pain  Therapy/Group: Individual Therapy  Adela Holter 09/24/2023, 2:54 PM

## 2023-09-24 NOTE — Progress Notes (Signed)
 Prune juice given per Dr. Alessandra Ancona.   Randeen Busman, LPN

## 2023-09-24 NOTE — Plan of Care (Signed)
  Problem: Consults Goal: RH STROKE PATIENT EDUCATION Description: See Patient Education module for education specifics  Outcome: Progressing   Problem: RH BOWEL ELIMINATION Goal: RH STG MANAGE BOWEL WITH ASSISTANCE Description: STG Manage Bowel with supervision Assistance. Outcome: Progressing Goal: RH STG MANAGE BOWEL W/MEDICATION W/ASSISTANCE Description: STG Manage Bowel with Medication with Assistance. Outcome: Progressing   Problem: RH BLADDER ELIMINATION Goal: RH STG MANAGE BLADDER WITH ASSISTANCE Description: STG Manage Bladder With supervision Assistance Outcome: Progressing   Problem: RH SKIN INTEGRITY Goal: RH STG SKIN FREE OF INFECTION/BREAKDOWN Description: Manage skin free of infection/breakdown with supervision assistance Outcome: Progressing   Problem: RH SAFETY Goal: RH STG ADHERE TO SAFETY PRECAUTIONS W/ASSISTANCE/DEVICE Description: STG Adhere to Safety Precautions With supervision  Assistance/Device. Outcome: Progressing

## 2023-09-24 NOTE — Progress Notes (Signed)
 RT NOTE: PT has home unit CPAP and places himself on when ready for bed.

## 2023-09-24 NOTE — Progress Notes (Addendum)
 Physical Therapy Session Note  Patient Details  Name: Allen Dyer MRN: [id number] Date of Birth: [birth year]  Today's Date: 09/24/2023 PT Individual Time: 1048-1200 PT Individual Time Calculation (min): 72 min   Short Term Goals: Week 3:  PT Short Term Goal 1 (Week 3): STG = LTG due to ELOS  Skilled Therapeutic Interventions/Progress Updates: Pt presents sitting in w/c and agreeable to therapy.  Pt states need for clean brief 2/2 void.  Pt transfers sit to stand in [place] w/ CGA and maintains standing for check.  Pt is dry and clothing managed by NT and PT.  Pt requires cues and min/mod A for controlled eccentric movement stand to sit in w/c.  Pt wheeled to main gym into // bars.  Pt transfers sit to stand w/ min/CGA and then amb forward/back in bars x 2 w/ min A, cues for LLE placement w/ noted ataxia.  Pt performed stepping to colored discs, alternating LES.  Pt performed sidestepping in bars, then stating accident.  Pt returned to room and stood in [place] for doffing pants and brief.  Pt required seated rest break on perch and then returned to standing for total A for brief and clothing management but maintains upright stance.  Pt then performed partial squats w/ hands on PT shoulders as well as step-pivot w/c <> bed w/ facilitation for weight shift to advance LES.  Pt remained sitting in w/c w/ seat alarm on and all needs in reach.     Therapy Documentation Precautions:  Precautions Precautions: Fall, Other (comment) (DNR) Precaution/Restrictions Comments: Urinary incontinence, DNR Required Braces or Orthoses: Splint/Cast Splint/Cast: R wrist cock up splint Restrictions Weight Bearing Restrictions Per Provider Order: No RUE Weight Bearing Per Provider Order: Weight bearing as tolerated Other Position/Activity Restrictions: as tolerates General:   Vital Signs:   Pain:0/10 Pain Assessment Pain Score: 0-No pain    Therapy/Group: Individual Therapy  Najia Hurlbutt P Darline Faith 09/24/2023,  12:52 PM

## 2023-09-24 NOTE — Progress Notes (Signed)
 Occupational Therapy Session Note  Patient Details  Name: Allen Dyer MRN: [id number] Date of Birth: 11/25/1935  Today's Date: 09/24/2023 OT Individual Time: 0847-1000 OT Individual Time Calculation (min): 73 min    Short Term Goals: Week 3:  OT Short Term Goal 1 (Week 3): Patient will complete hygiene at sink with set up assist OT Short Term Goal 2 (Week 3): Patient will feed himself and drink from a cup with intermittent min assist OT Short Term Goal 3 (Week 3): STG= LTG due to LOS  Skilled Therapeutic Interventions/Progress Updates:   Patient received sleeping soundly in bed.  Patient woke easily and was agreeable to get out of bed.  Patient able to roll and come to sitting with increased time and min assistance!  Patient needed help to obtain two feet on floor while seated at edge of bed.  Patient eager to shower.  Completed Stedy transfer with contact guard assistance.  Today able to stand in [place] for longer periods of time, and transition to sitting with cueing.  Patient able to bathe parts of his body with either left or right hand without total loss of sitting balance.  Once bathed, transferred out of bathroom to sink to complete grooming and dressing.  Patient able to use reacher to aide with LB dressing today.  Able to lean forward further today to spit into sink after brushing teeth.   Patient transported to gym to address sit to stand, stand to sit , and stand step turning transfers.  Patient able to use bars to pull to stand without physical assistance. Patient able to align hips and knees and trunk over feet once standing.  Patient able to weight shift and step today - needs cueing and facilitation for foot placement due to ataxia.  Patient able to complete stand step turn with min assist and step by step cueing.  Left up in wheelchair with chair pad alarm in place and engaged.  Personal items and call bell in reach.    Therapy Documentation Precautions:  Precautions Precautions:  Fall, Other (comment) (DNR) Precaution/Restrictions Comments: Urinary incontinence, DNR Required Braces or Orthoses: Splint/Cast Splint/Cast: R wrist cock up splint Restrictions Weight Bearing Restrictions Per Provider Order: No RUE Weight Bearing Per Provider Order: Weight bearing as tolerated Other Position/Activity Restrictions: as tolerates   Pain: Pain Assessment Pain Score: 0-No pain    Therapy/Group: Individual Therapy  [name] 09/24/2023, 12:56 PM

## 2023-09-25 LAB — GLUCOSE, CAPILLARY
Glucose-Capillary: 84 mg/dL (ref 70–99)
Glucose-Capillary: 176 mg/dL — ABNORMAL HIGH (ref 70–99)
Glucose-Capillary: 174 mg/dL — ABNORMAL HIGH (ref 70–99)
Glucose-Capillary: 151 mg/dL — ABNORMAL HIGH (ref 70–99)

## 2023-09-25 MED ORDER — GABAPENTIN 100 MG PO CAPS
100.0000 mg | ORAL_CAPSULE | Freq: Every day | ORAL | Status: DC
  Administered 2023-09-25 – 2023-09-26 (×2): 100 mg via ORAL
  Filled 2023-09-25 (×2): qty 1

## 2023-09-25 MED ORDER — DOCUSATE SODIUM 100 MG PO CAPS: 100.0000 mg | ORAL_CAPSULE | Freq: Every day | ORAL | Status: DC

## 2023-09-25 NOTE — Progress Notes (Signed)
 Speech Language Pathology Daily Session Note  Patient Details  Name: Allen Dyer MRN: [id number] Date of Birth: 01-13-1936  Today's Date: 09/25/2023 SLP Individual Time: 5284-1324 SLP Individual Time Calculation (min): 25 min  Short Term Goals: Week 2: SLP Short Term Goal 1 (Week 2): Patient will tolerate least restrictive diet across a meal with no more than 1 overt clinical instance of suspected aspiration given min cues for use of swallow compensatory strategies. SLP Short Term Goal 2 (Week 2): Patient will orient to date utilizing external aids when provided with min verbal cues. SLP Short Term Goal 3 (Week 2): Patient will selectively attend to speech therapy tasks for 10 minute intervals given min assist. SLP Short Term Goal 4 (Week 2): Patient will recall pertinent daily information with 80% accuracy utilizing external/internal aids when given min assist. SLP Short Term Goal 5 (Week 2): Patient will solve basic environmental problems with 80% accuracy given min assist. SLP Short Term Goal 6 (Week 2): Patient will utilize speech intelligibility and communication repair strategies in 8/10 opportunities at the phrase level given min assist.  Skilled Therapeutic Interventions: SLP conducted skilled therapy session targeting cognitive and communication goals. Patient completed various verbal problem solving tasks via reading problems out loud and providing SLP with answers using SLOP speech intelligibility strategies. Patient benefited from min assist to utilize strategies. He independently slowed rate, but required encouragement to increase volume, stating that he doesn't want to feel like he is "shouting at people". Provided education re: need to increase volume to improve intelligibility and re: perceived loudness vs. Actual loudness. Patient verbalized understanding. During task, patient benefited from supervision to provide accurate answers to problems. Patient was left in room with call bell  in reach and alarm set. SLP will continue to target goals per plan of care.        Pain  None endorsed   Therapy/Group: Individual Therapy  Wendie Diskin, M.A., CCC-SLP  [name] [name] 09/25/2023, 2:28 PM

## 2023-09-25 NOTE — Progress Notes (Signed)
 Physical Therapy Session Note  Patient Details  Name: Allen Dyer MRN: [id number] Date of Birth: 05-08-1935  Today's Date: 09/25/2023 PT Individual Time: 1002-1045 + 1430-1535  PT Individual Time Calculation (min): 43 min  + 65 min  Short Term Goals: Week 3:  PT Short Term Goal 1 (Week 3): STG = LTG due to ELOS  Skilled Therapeutic Interventions/Progress Updates:        1st session: Pt sitting up in wheelchair to start - no complaints of pain. Pt reports he didn't receive any breakfast - provided graham crackers during session to help with hunger. When asked what he would like to work on in therapy, pt reports "side stepping."   Patient taken to main gym and wheeled inside // bars. Sit<>Stand in // bars with CGA with patient pulling himself to stand. minA for turning in // bars, for general stability and balance. Patient completed lateral stepping L<>R 1x21ft with CGA in // bars - limited by ataxia in BLE and rocking motion at his trunk. Min cues for general sequencing and for controlled coordinated movement patterns. Upgraded task to step over 5" hurdles with similar assist level and cues - patient able to clear 5/6 hurdles.   Patient instructed in stair training - completed x4 stairs (6" height) using 2 hand rails and step-to pattern while forward facing. Pt requires CGA for ascent and minA for descent. Min cues for general safety awareness and sequencing, to keep hands in front to reduce his posterior bias.   Pt returned to his room and left sitting up in wheelchair with his needs met, call bell within reach.     2nd session: Patient sitting up in wheelchair to start session. Pt has no complaints of pain. No family present at start of session for family training or education.   Transported patient at wheelchair level to ortho gym. Patient instructed in ambulatory car transfer using the RW. Patient requires minA for sit<>Stand to RW and modA for initial balance due to posterior bias.  He ambulates ~74ft with minA and RW with assist needed for RW management and mod cues for keeping body within walker frame. [name] able to enter and exit vehicle with minA overall. Continued gait training from the car at similar assist and cues as above ~19ft. Patient taken to ADL apartment to practice furniture transfers and bed mobility on regular bed. Completed bed mobility at [facility] for sit to supine and minA for supine to sitting. Required mod/maxA for furniture transfers due to low surface height and patient having difficulty initiating forward trunk lean and keeping feet from sliding during transition. In ADL apartment, patient worked on overhead reaching, standing balance with counter support, and standing tolerance while putting dishes from top cabinet. Patient returned to his room and was left sitting up in wheelchair with all needs met. Daughter entering room to end session and details and progress was reviewed.       Therapy Documentation Precautions:  Precautions Precautions: Fall, Other (comment) (DNR) Precaution/Restrictions Comments: Urinary incontinence, DNR Required Braces or Orthoses: Splint/Cast Splint/Cast: R wrist cock up splint Restrictions Weight Bearing Restrictions Per Provider Order: No RUE Weight Bearing Per Provider Order: Weight bearing as tolerated Other Position/Activity Restrictions: as tolerates General:       Therapy/Group: Individual Therapy  Del Wiseman P [name] PT 09/25/2023, 9:53 AM

## 2023-09-25 NOTE — Progress Notes (Signed)
 PT called nurse in room at 0400 to take off CPAP. Nurse took off cpap, raised HOB and offered water. PT drank some water, tolerated well. IN morning pt stated he couldn't breathe O2 sat 99%, nurse repositioned in bed. Pt stated he felt much better

## 2023-09-25 NOTE — Progress Notes (Signed)
 PROGRESS NOTE   Subjective/Complaints: No new complaints this morning 5/5 strength throughout except for right arm in splint Feels well Appreciate nursing note overnight  ROS: Patient denies fever, rash, sore throat, blurred vision, dizziness, nausea, vomiting, diarrhea, cough, shortness of breath or chest pain, joint or back/neck pain, headache, or mood change. Drooling improved, cognition improved, strength improving  Objective:   No results found.    No results for input(s): "WBC", "HGB", "HCT", "PLT" in the last 72 hours.   No results for input(s): "NA", "K", "CL", "CO2", "GLUCOSE", "BUN", "CREATININE", "CALCIUM " in the last 72 hours.    Intake/Output Summary (Last 24 hours) at 09/25/2023 1108 Last data filed at 09/24/2023 1931 Gross per 24 hour  Intake 720 ml  Output --  Net 720 ml        Physical Exam: Vital Signs Blood pressure 130/66, pulse 67, temperature 98 F (36.7 C), temperature source Oral, resp. rate 18, height 5\' 7"  (1.702 m), weight 88.3 kg, SpO2 94%.  Constitutional: No distress . Vital signs reviewed.  Laying in bed.  HEENT: NCAT, EOMI, oral membranes moist, visual deficits noted Neck: supple Cardiovascular: Bradycardia Respiratory/Chest: CTA Bilaterally without wheezes or rales. Normal effort    GI/Abdomen: BS +, non-tender, non-distended Ext: no clubbing, cyanosis, or edema Psych: pleasant and cooperative    Skin: intact; mild right upper extremity swelling/edema  Neuro:very dysarthric ataxic speech, improved conversation and awareness. Moves all 4's but does demonstrate ataxia   No tone or spasticity noted.  Cranial nerves grossly intact.   MSK: R  arm remains in splint, 5/5 strength throughout except for right arm in splint  Functional mobility: standing in [place] with OT Miki Alert in shower stall    Physical exam unchanged from the above on reexamination 09/25/23       Assessment/Plan: 1. Functional deficits which require 3+ hours per day of interdisciplinary therapy in a comprehensive inpatient rehab setting. Physiatrist is providing close team supervision and 24 hour management of active medical problems listed below. Physiatrist and rehab team continue to assess barriers to discharge/monitor patient progress toward functional and medical goals  Care Tool:  Bathing    Body parts bathed by patient: Right arm, Chest, Abdomen, Face, Front perineal area, Right upper leg, Left upper leg   Body parts bathed by helper: Right arm, Left arm, Chest, Abdomen, Front perineal area, Buttocks, Right upper leg, Left upper leg, Right lower leg, Left lower leg, Face     Bathing assist Assist Level: Moderate Assistance - Patient 50 - 74%     Upper Body Dressing/Undressing Upper body dressing   What is the patient wearing?: Pull over shirt    Upper body assist Assist Level: Minimal Assistance - Patient > 75%    Lower Body Dressing/Undressing Lower body dressing      What is the patient wearing?: Pants, Incontinence brief     Lower body assist Assist for lower body dressing: Maximal Assistance - Patient 25 - 49%     Toileting Toileting Toileting Activity did not occur (Clothing management and hygiene only): N/A (no void or bm)  Toileting assist Assist for toileting: Total Assistance - Patient < 25%  Transfers Chair/bed transfer  Transfers assist     Chair/bed transfer assist level: Moderate Assistance - Patient 50 - 74%     Locomotion Ambulation   Ambulation assist   Ambulation activity did not occur: Safety/medical concerns  Assist level: Moderate Assistance - Patient 50 - 74% Assistive device: Hand held assist Max distance: 71ft   Walk 10 feet activity   Assist  Walk 10 feet activity did not occur: Safety/medical concerns  Assist level: Moderate Assistance - Patient - 50 - 74% Assistive device: Hand held assist   Walk  50 feet activity   Assist Walk 50 feet with 2 turns activity did not occur: Safety/medical concerns  Assist level: Minimal Assistance - Patient > 75% Assistive device: Walker-Eva    Walk 150 feet activity   Assist Walk 150 feet activity did not occur: Safety/medical concerns  Assist level: Minimal Assistance - Patient > 75% Assistive device: Walker-Eva    Walk 10 feet on uneven surface  activity   Assist Walk 10 feet on uneven surfaces activity did not occur: Safety/medical concerns         Wheelchair     Assist Is the patient using a wheelchair?: Yes Type of Wheelchair: Manual    Wheelchair assist level: Dependent - Patient 0%      Wheelchair 50 feet with 2 turns activity    Assist        Assist Level: Dependent - Patient 0%   Wheelchair 150 feet activity     Assist      Assist Level: Dependent - Patient 0%   Blood pressure 130/66, pulse 67, temperature 98 F (36.7 C), temperature source Oral, resp. rate 18, height 5\' 7"  (1.702 m), weight 88.3 kg, SpO2 94%.    Medical Problem List and Plan: 1. Functional deficits secondary to bilateral cerebellar infarct, right more than left secondary large vessel disease from severe posterior circulation stenosis             -patient may shower             -ELOS/Goals: 10-12,PT/OT sup to mod I             -Grounds pass ordered  -MRI repeat 5/17, was discussed with neurology Dr. Bonnita Buttner , not acute cva but some progression of prior strokes. No additional changes, avoid hypotension   Continue CIR  Continue D3 2,000U daily  Discussed progress with therapy- he is improving in speech, standing, ability to use [place] cup independently  5-31: See palliative care note for full rec; DNR/DNI with limited interventions.  2.  Impaired mobility: continue Lovenox              -antiplatelet therapy: Aspirin  81 mg daily and Plavix  75 mg day x 3 months then Plavix  alone  3. Pain: Denies pain, decrease gabapentin  to  100mg  HS  4. Mood/Behavior/Sleep: Provide emotional support             -antipsychotic agents: N/A  5. Depression:  -lexapro  d/ced given lethargy  -mood has been positive  -continue metanx   6. Skin/Wound Care: Routine skin checks  5-19: Right upper extremity duplex ordered prior for swelling negative for DVT, however with multiple cystic structures concerning for abscess.  CT reviewed and showed triquetral fracture, ortho consulted and splint placed  7. Fluids/Electrolytes/Nutrition: Routine in and outs with follow-up chemistries 8.  Klebsiella oxytoca UTI.  Completing course of Duricef  - Per urology note, is to continue on cefadroxil  1 tab p.o. nightly for CAUTI  prophylaxis while Foley is in place  9.  CKD stage IV.  Discussed that creatinine is improving  -Cr overalls stable 2.47/BUN 50- monitor  5-26 Cr in range of recent labs  10.  Chronic combined systolic and diastolic congestive heart failure. EF 25-30% Monitor for any signs of fluid overload.  Follow-up cardiology services.  Continue Demadex  60 mg twice daily, weight reviewed and is increased, daily weights ordered, weights are stable/increased, continue current regimen  5/17 wt trending up although suspect may be inaccurate, no signs of overload noted  5/18 Wt appears stable, monitor  5/19: weights downtrending, ? Stroke recrudescence with normotension d/t stenosis, reduce torsemide  to 60 mg daily  5/26 sl downward trend still. Was eating well this morning -continue to encourage intake  5/31: Weights down a little bit.  Monitor. 6/1: Weights likely inaccurate - appears euvolemic  Filed Weights   09/23/23 0553 09/24/23 0448 09/25/23 0417  Weight: 90.6 kg 88.1 kg 88.3 kg     11.  Nonischemic cardiomyopathy.  Followed by cardiology service Dr. Micael Adas.  Continue Imdur  30 mg daily and Coreg  6.25 mg twice daily.  See #10.  12.  History of prostate cancer, BPH.  Followed by Dr. Freddi Jaeger.  Flomax  0.4 mg twice daily              -Check PVR/bladder scan  5/17-8 he has foley in placed, clear yellow urine  5/26 pt is emptying incontinently--continues  13.  Diabetes mellitus with peripheral neuropathy and hemoglobin A1c 7.0.  Glucotrol  5 mg daily, d/c ISS, d/c semglee   - Blood sugars well-controlled CBG (last 3)  Recent Labs    09/24/23 1642 09/24/23 2142 09/25/23 0632  GLUCAP 197* 122* 84     14. HTN. Long term goal normotensive. Home meds carvedilol , imdur , toresemide  -5/18 would like avoid hypotension, decrease Imdur  to 15 mg, decrease Coreg  to 3.125 twice daily, decrease torsemide  to daily  5/19: Weight down, BP normotensive but may be underperfusing; reduce torsemide  as above. Can consider reducing flomax  as well pending DC foley trial but cannot stay off long term d/t chronic retention.  5/26 bp controlled  6/1: A little soft ,back up this AM, monitor and consider reducing torsemide    Vitals:   09/24/23 1931 09/25/23 0300  BP: (!) 111/58 130/66  Pulse: 79 67  Resp: 16 18  Temp: 98 F (36.7 C) 98 F (36.7 C)  SpO2: 95% 94%      15. Obesity. Body mass index is 33.67 kg/m.             -dietary education  16. R wrist pain due to fall. Wearing wrist brace. Xray without fracture. Voltaren  gel scheduled             -tylenol  prn  -5/17 Will check vas US  due to R arm swelling  5-19: See above; no DVT, possible abscesses/cystic structures.    5/20: CT reviewed and shows age indeterminate fracture, will consult ortho  17. Constipation: D/c miralax , d/c [place], encouraged prune juice, asked nursing to bring prune juice for him  - resolved  18. Dizziness/vertigo/nystagmus: much improved, continue scopolamine  patch  19. Anemia: Hgb reviewed and was stable last check, no new concerning findings to warrant repeat at this time  20. AMS: resolved  21. Drooling.    - Continue Scopalamine 1.5 mg patch  22. Lethargy: modafinil  ordered, helping, continue  23. Penile swelling: placed order to d/c  foley, improved  24. Constipation: d/c metamucil, d/c imodium, added colace  daily, asked nursing to bring him prune juice  25, Bradycardia: resolved  26. Hypotension: resolved  27. Post-stroke visual deficits: continue to monitor        LOS: 20 days A FACE TO FACE EVALUATION WAS PERFORMED  Cierra Rothgeb P Amberlea Spagnuolo 09/25/2023, 11:08 AM

## 2023-09-25 NOTE — Progress Notes (Signed)
 Occupational Therapy Session Note  Patient Details  Name: Allen Dyer MRN: [id number] Date of Birth: 1935-07-12  Today's Date: 09/25/2023 OT Individual Time: 4098-1191 OT Individual Time Calculation (min): 54 min    Short Term Goals: Week 3:  OT Short Term Goal 1 (Week 3): Patient will complete hygiene at sink with set up assist OT Short Term Goal 2 (Week 3): Patient will feed himself and drink from a cup with intermittent min assist OT Short Term Goal 3 (Week 3): STG= LTG due to LOS  Skilled Therapeutic Interventions/Progress Updates:    Family education session completed with pt and his daughter Allen Dyer. Verbal education provided re fall risk reduction, energy conservation strategies, home carryover of transfer training, ADLs. Demonstration and hands on training completed for pt performance of toileting using a stedy (daughter has purchased one for home). Provided education and demonstration on DME use recommendations at home. [name] was able to return demo. Pt required max A for toileting tasks. He returned to the w/c following and was then taken to the therapy gym. Pt completed mini squats in the parallel bars to challenge generalized strengthening for ADL transfers, as well as increasing functional activity tolerance and cardiorespiratory endurance. Pt required CGA with BUE support. He returned to his room and was left sitting up with all needs met, chair alarm set.    Therapy Documentation Precautions:  Precautions Precautions: Fall, Other (comment) (DNR) Precaution/Restrictions Comments: Urinary incontinence, DNR Required Braces or Orthoses: Splint/Cast Splint/Cast: R wrist cock up splint Restrictions Weight Bearing Restrictions Per Provider Order: No RUE Weight Bearing Per Provider Order: Weight bearing as tolerated Other Position/Activity Restrictions: as tolerates  Therapy/Group: Individual Therapy  [name] 09/25/2023, 1:32 PM

## 2023-09-25 NOTE — Plan of Care (Signed)
  Problem: Consults Goal: RH STROKE PATIENT EDUCATION Description: See Patient Education module for education specifics  Outcome: Progressing   Problem: Consults Goal: RH STROKE PATIENT EDUCATION Description: See Patient Education module for education specifics  Outcome: Progressing   Problem: RH BOWEL ELIMINATION Goal: RH STG MANAGE BOWEL WITH ASSISTANCE Description: STG Manage Bowel with supervision Assistance. Outcome: Progressing   Problem: RH BOWEL ELIMINATION Goal: RH STG MANAGE BOWEL W/MEDICATION W/ASSISTANCE Description: STG Manage Bowel with Medication with Assistance. Outcome: Progressing   Problem: RH BLADDER ELIMINATION Goal: RH STG MANAGE BLADDER WITH ASSISTANCE Description: STG Manage Bladder With supervision Assistance Outcome: Progressing   Problem: RH SKIN INTEGRITY Goal: RH STG SKIN FREE OF INFECTION/BREAKDOWN Description: Manage skin free of infection/breakdown with supervision assistance Outcome: Progressing   Problem: RH KNOWLEDGE DEFICIT Goal: RH STG INCREASE KNOWLEDGE OF HYPERTENSION Description: Manage knowledge of hypertension with supervision assistance from daughter using educational materials provided Outcome: Progressing   Problem: RH KNOWLEDGE DEFICIT Goal: RH STG INCREASE KNOWLEGDE OF HYPERLIPIDEMIA Description: Manage knowledge of hyperlipidemia with supervision assistance from daughter using educational materials provided Outcome: Progressing   Problem: RH KNOWLEDGE DEFICIT Goal: RH STG INCREASE KNOWLEDGE OF DIABETES Description: Manage knowledge of diabetes with supervision assistance from daughter using educational materials provided Outcome: Progressing

## 2023-09-26 ENCOUNTER — Other Ambulatory Visit (HOSPITAL_COMMUNITY): Payer: Self-pay

## 2023-09-26 LAB — GLUCOSE, CAPILLARY
Glucose-Capillary: 85 mg/dL (ref 70–99)
Glucose-Capillary: 124 mg/dL — ABNORMAL HIGH (ref 70–99)
Glucose-Capillary: 246 mg/dL — ABNORMAL HIGH (ref 70–99)
Glucose-Capillary: 282 mg/dL — ABNORMAL HIGH (ref 70–99)

## 2023-09-26 MED ORDER — TORSEMIDE 10 MG PO TABS
30.0000 mg | ORAL_TABLET | Freq: Every day | ORAL | 0 refills | Status: DC
  Filled 2023-09-26: qty 90, 30d supply, fill #0

## 2023-09-26 MED ORDER — PROPRANOLOL HCL 10 MG PO TABS
10.0000 mg | ORAL_TABLET | Freq: Every day | ORAL | Status: DC
  Administered 2023-09-26 – 2023-09-27 (×2): 10 mg via ORAL
  Filled 2023-09-26 (×2): qty 1

## 2023-09-26 MED ORDER — CLOPIDOGREL BISULFATE 75 MG PO TABS: 75.0000 mg | ORAL_TABLET | Freq: Every day | ORAL | 0 refills | Status: DC

## 2023-09-26 MED ORDER — SCOPOLAMINE 1 MG/3DAYS TD PT72
1.0000 | MEDICATED_PATCH | TRANSDERMAL | 0 refills | Status: DC
Start: 2023-09-28 — End: 2023-12-03

## 2023-09-26 MED ORDER — GLIPIZIDE 5 MG PO TABS: 5.0000 mg | ORAL_TABLET | Freq: Two times a day (BID) | ORAL | 0 refills | Status: DC

## 2023-09-26 MED ORDER — MODAFINIL 100 MG PO TABS: 100.0000 mg | ORAL_TABLET | Freq: Every day | ORAL | 0 refills | Status: DC

## 2023-09-26 MED ORDER — CYANOCOBALAMIN 1000 MCG PO TABS: 1000.0000 ug | ORAL_TABLET | Freq: Every morning | ORAL | 0 refills | Status: AC

## 2023-09-26 MED ORDER — TAMSULOSIN HCL 0.4 MG PO CAPS: 0.4000 mg | ORAL_CAPSULE | Freq: Two times a day (BID) | ORAL | 0 refills | Status: DC

## 2023-09-26 MED ORDER — POTASSIUM CHLORIDE CRYS ER 20 MEQ PO TBCR: 20.0000 meq | EXTENDED_RELEASE_TABLET | Freq: Every day | ORAL | 0 refills | Status: DC

## 2023-09-26 MED ORDER — TORSEMIDE 10 MG PO TABS: 30.0000 mg | ORAL_TABLET | Freq: Every day | ORAL | 0 refills | Status: DC

## 2023-09-26 MED ORDER — CARVEDILOL 3.125 MG PO TABS
3.1250 mg | ORAL_TABLET | Freq: Two times a day (BID) | ORAL | 0 refills | Status: DC
  Filled 2023-09-26: qty 60, 30d supply, fill #0

## 2023-09-26 MED ORDER — MECLIZINE HCL 12.5 MG PO TABS
12.5000 mg | ORAL_TABLET | Freq: Every day | ORAL | 0 refills | Status: DC | PRN
  Filled 2023-09-26: qty 30, 30d supply, fill #0

## 2023-09-26 MED ORDER — CARVEDILOL 3.125 MG PO TABS: 3.1250 mg | ORAL_TABLET | Freq: Two times a day (BID) | ORAL | 0 refills | Status: DC

## 2023-09-26 MED ORDER — DICLOFENAC SODIUM 1 % EX GEL
2.0000 g | Freq: Four times a day (QID) | CUTANEOUS | 0 refills | Status: DC
  Filled 2023-09-26: qty 100, 15d supply, fill #0

## 2023-09-26 MED ORDER — PROPRANOLOL HCL 10 MG PO TABS: 10.0000 mg | ORAL_TABLET | Freq: Every day | ORAL | 0 refills | Status: DC

## 2023-09-26 MED ORDER — GABAPENTIN 100 MG PO CAPS
100.0000 mg | ORAL_CAPSULE | Freq: Every day | ORAL | 0 refills | Status: DC
  Filled 2023-09-26: qty 30, 30d supply, fill #0

## 2023-09-26 MED ORDER — TAMSULOSIN HCL 0.4 MG PO CAPS
0.4000 mg | ORAL_CAPSULE | Freq: Two times a day (BID) | ORAL | 0 refills | Status: DC
  Filled 2023-09-26: qty 60, 30d supply, fill #0

## 2023-09-26 MED ORDER — ISOSORBIDE MONONITRATE ER 30 MG PO TB24
15.0000 mg | ORAL_TABLET | Freq: Every morning | ORAL | 0 refills | Status: DC
  Filled 2023-09-26: qty 30, 60d supply, fill #0

## 2023-09-26 MED ORDER — MODAFINIL 100 MG PO TABS
100.0000 mg | ORAL_TABLET | Freq: Every day | ORAL | 0 refills | Status: DC
  Filled 2023-09-26: qty 30, 30d supply, fill #0

## 2023-09-26 MED ORDER — DICLOFENAC SODIUM 1 % EX GEL: 2.0000 g | Freq: Four times a day (QID) | CUTANEOUS | 0 refills | Status: DC

## 2023-09-26 MED ORDER — MECLIZINE HCL 12.5 MG PO TABS: 12.5000 mg | ORAL_TABLET | Freq: Every day | ORAL | 0 refills | Status: DC | PRN

## 2023-09-26 MED ORDER — GABAPENTIN 100 MG PO CAPS: 100.0000 mg | ORAL_CAPSULE | Freq: Every day | ORAL | 0 refills | Status: DC

## 2023-09-26 MED ORDER — SCOPOLAMINE 1 MG/3DAYS TD PT72
1.0000 | MEDICATED_PATCH | TRANSDERMAL | 0 refills | Status: DC
  Filled 2023-09-26: qty 10, 30d supply, fill #0

## 2023-09-26 MED ORDER — POTASSIUM CHLORIDE CRYS ER 20 MEQ PO TBCR
20.0000 meq | EXTENDED_RELEASE_TABLET | Freq: Every day | ORAL | 0 refills | Status: DC
  Filled 2023-09-26: qty 30, 30d supply, fill #0

## 2023-09-26 MED ORDER — GLIPIZIDE 5 MG PO TABS
5.0000 mg | ORAL_TABLET | Freq: Two times a day (BID) | ORAL | 0 refills | Status: DC
  Filled 2023-09-26: qty 60, 30d supply, fill #0

## 2023-09-26 MED ORDER — ISOSORBIDE MONONITRATE ER 30 MG PO TB24: 15.0000 mg | ORAL_TABLET | Freq: Every morning | ORAL | 0 refills | Status: DC

## 2023-09-26 MED ORDER — CLOPIDOGREL BISULFATE 75 MG PO TABS
75.0000 mg | ORAL_TABLET | Freq: Every day | ORAL | 0 refills | Status: DC
  Filled 2023-09-26: qty 30, 30d supply, fill #0

## 2023-09-26 MED ORDER — CYANOCOBALAMIN 1000 MCG PO TABS
1000.0000 ug | ORAL_TABLET | Freq: Every morning | ORAL | 0 refills | Status: DC
  Filled 2023-09-26: qty 30, 30d supply, fill #0

## 2023-09-26 NOTE — Progress Notes (Signed)
 Physical Therapy Discharge Summary  Patient Details  Name: Allen Dyer MRN: [id number] Date of Birth: 03/04/1936  Date of Discharge from PT service:September 26, 2023  Patient has met 8 of 8 long term goals due to improved activity tolerance, improved balance, improved postural control, increased strength, improved attention, and improved awareness.  Patient to discharge at a wheelchair level Mod Assist.   Patient's care partner is independent to provide the necessary physical and cognitive assistance at discharge.  Reasons goals not met: n/a goals met  Recommendation:  Patient will benefit from ongoing skilled PT services in home health setting to continue to advance safe functional mobility, address ongoing impairments in ataxia, bed mobility, functional transfers, caregiver training, and minimize fall risk.  Equipment: Manual wheelchair, hospital bed. Family purchasing a Dwana Gist for home  Reasons for discharge: treatment goals met and discharge from hospital  Patient/family agrees with progress made and goals achieved: Yes  PT Discharge Precautions/Restrictions Precautions Precautions: Fall Precaution/Restrictions Comments: Ataxia, Urinary incontinence, DNR Required Braces or Orthoses: Splint/Cast Splint/Cast: R wrist cock up splint Restrictions Weight Bearing Restrictions Per Provider Order: No Pain Interference Pain Interference Pain Effect on Sleep: 1. Rarely or not at all Pain Interference with Therapy Activities: 1. Rarely or not at all Pain Interference with Day-to-Day Activities: 1. Rarely or not at all Vision/Perception  Vision - History Ability to See in Adequate Light: 0 Adequate Vision - Assessment Eye Alignment: Within Functional Limits Ocular Range of Motion: Restricted on the left;Restricted on the right;Restricted looking up Alignment/Gaze Preference: Within Defined Limits Tracking/Visual Pursuits: Decreased smoothness of vertical tracking;Decreased  smoothness of horizontal tracking Saccades: Additional eye shifts occurred during testing;Additional head turns occurred during testing;Overshoots;Undershoots Convergence: Impaired (comment) Additional Comments: poor visual fixation - although patient managing more spontaneously Perception Perception: Impaired Preception Impairment Details: Spatial orientation Praxis Praxis: WFL  Cognition Overall Cognitive Status: Impaired/Different from baseline Arousal/Alertness: Awake/alert Attention: Selective;Alternating Focused Attention: Appears intact Sustained Attention: Appears intact Selective Attention: Appears intact Selective Attention Impairment: Functional basic Memory: Appears intact Awareness: Appears intact Awareness Impairment: Anticipatory impairment Problem Solving: Appears intact Executive Function: Initiating Initiating: Impaired Initiating Impairment: Functional basic Safety/Judgment: Appears intact Sensation Sensation Light Touch: Appears Intact Hot/Cold: Appears Intact Proprioception: Impaired Detail Proprioception Impaired Details: Impaired RLE;Impaired LLE Stereognosis: Not tested Coordination Gross Motor Movements are Fluid and Coordinated: No Fine Motor Movements are Fluid and Coordinated: No Coordination and Movement Description: Truncal and BLE ataxia, generalized weakness Motor  Motor Motor: Abnormal postural alignment and control;Hemiplegia;Motor apraxia;Ataxia  Mobility Bed Mobility Bed Mobility: Sit to Supine;Supine to Sit Supine to Sit: Moderate Assistance - Patient 50-74% Sit to Supine: Minimal Assistance - Patient > 75% Transfers Transfers: Sit to Stand;Stand to Sit;Stand Pivot Transfers Sit to Stand: Moderate Assistance - Patient 50-74% Stand to Sit: Minimal Assistance - Patient > 75% Stand Pivot Transfers: Moderate Assistance - Patient 50 - 74% Stand Pivot Transfer Details: Tactile cues for initiation;Verbal cues for sequencing;Visual  cues/gestures for sequencing;Verbal cues for technique;Verbal cues for precautions/safety;Tactile cues for posture;Manual facilitation for weight shifting;Verbal cues for gait pattern;Verbal cues for safe use of DME/AE Transfer (Assistive device): 1 person hand held assist Locomotion  Gait Ambulation: Yes Gait Assistance: Minimal Assistance - Patient > 75% Gait Distance (Feet): 25 Feet Assistive device: Rolling walker Gait Assistance Details: Tactile cues for initiation;Tactile cues for weight shifting;Verbal cues for gait pattern;Verbal cues for technique;Verbal cues for precautions/safety;Verbal cues for safe use of DME/AE;Verbal cues for sequencing;Tactile cues for posture;Manual facilitation for weight shifting Gait Gait: Yes  Gait Pattern: Impaired Gait Pattern: Step-to pattern;Decreased stride length;Decreased dorsiflexion - left;Decreased dorsiflexion - right;Poor foot clearance - left;Poor foot clearance - right;Trunk flexed;Ataxic;Decreased trunk rotation Stairs / Additional Locomotion Stairs: Yes Stairs Assistance: Minimal Assistance - Patient > 75% Stair Management Technique: Two rails;Step to pattern;Forwards Number of Stairs: 4 Height of Stairs: 6 Pick up small object from the floor (from standing position) activity did not occur: Safety/medical concerns Wheelchair Mobility Wheelchair Mobility: No  Trunk/Postural Assessment  Cervical Assessment Cervical Assessment: Exceptions to [facility] (forward head) Thoracic Assessment Thoracic Assessment: Exceptions to [facility] (rounded shoulders) Lumbar Assessment Lumbar Assessment: Exceptions to [facility] (posterior pelvi tilt) Postural Control Postural Control: Deficits on evaluation Trunk Control: Strong Posterior Bias  Balance Balance Balance Assessed: Yes Static Sitting Balance Static Sitting - Balance Support: Feet supported;No upper extremity supported Static Sitting - Level of Assistance: 5: Stand by assistance Dynamic Sitting  Balance Dynamic Sitting - Balance Support: Feet supported;No upper extremity supported Dynamic Sitting - Level of Assistance: 4: Min Oncologist Standing - Balance Support: Bilateral upper extremity supported Static Standing - Level of Assistance: 4: Min assist Dynamic Standing Balance Dynamic Standing - Balance Support: During functional activity;Bilateral upper extremity supported Dynamic Standing - Level of Assistance: 3: Mod assist Extremity Assessment  RUE Assessment RUE Assessment: Exceptions to [facility] Active Range of Motion (AROM) Comments: Raises arms to ~ !00* shoulder flexion - reports this is premorbid General Strength Comments: NT RUE Body System: Neuro Brunstrum levels for arm and hand: Arm;Hand Brunstrum level for arm: Stage V Relative Independence from [employer] Brunstrum level for hand: Stage VI Isolated joint movements LUE Assessment LUE Assessment: Exceptions to [facility] Active Range of Motion (AROM) Comments: Raises arms to ~ !00* shoulder flexion - reports this is premorbid LUE Body System: Neuro Brunstrum levels for arm and hand: Arm;Hand Brunstrum level for arm: Stage V Relative Independence from [employer] Brunstrum level for hand: Stage VI Isolated joint movements RLE Assessment RLE Assessment: Exceptions to [facility] General Strength Comments: Grossly 4-/5 LLE Assessment LLE Assessment: Exceptions to [facility] General Strength Comments: Grossly 4-/5   Daijon Wenke P Dyson Sevey 09/26/2023, 7:50 AM

## 2023-09-26 NOTE — Patient Care Conference (Signed)
 Inpatient RehabilitationTeam Conference and Plan of Care Update Date: 09/26/2023   Time: 11:07 AM    Patient Name: Allen Dyer      Medical Record Number: [id number]  Date of Birth: 1936/03/17 Sex: Male         Room/Bed: 4M02C/4M02C-01 Payor Info: Payor: MEDICARE / Plan: MEDICARE PART A AND B / Product Type: *No Product type* /    Admit Date/Time:  09/05/2023  6:19 PM  Primary Diagnosis:  Ischemic cerebrovascular accident (CVA) [facility])  Hospital Problems: Principal Problem:   Ischemic cerebrovascular accident (CVA) (HCC) Active Problems:   Unspecified symptoms and signs involving cognitive functions following cerebral infarction    Expected Discharge Date: Expected Discharge Date: 09/27/23  Team Members Present: Physician leading conference: Dr. Laverle Postin Social Worker Present: Adrianna Albee, LCSW Nurse Present: Forrestine Ike, RN PT Present: Oma Bias, PT OT Present: Celestino Cole, OT SLP Present: Dorla Gartner, SLP     Current Status/Progress Goal Weekly Team Focus  Bowel/Bladder      Continent/incontinent at times    Management of bowel and bladder with toileting assist; urinary incontinence at baseline    toileting  Swallow/Nutrition/ Hydration   Dys3/thin liquids, 10cc provale cup   min assist  diet upgrade, use of strategies with supervision    ADL's   Mod assist toileting, mod assist bathing and min dressing   Mod assist - goals downgraded   family education, discharge planning    Mobility   min/modA bed mobility wtih extra time, modA stand pivot transfers with BUE support, CGA for transfers via [employer]. Moda for short distance gait in // bars. Ataxia and endurance limiting   dwongraded to modA  postural control, ataxia, functional transfesr, DC planning, family education    Communication   Utilizes SLOP strategies with min assist at the sentence level   min assist   continued utilization of speech intelligibility strategies, improve consistency     Safety/Cognition/ Behavioral Observations  benefits from supervision for verbal problem solving for working memory and processing speed - at goal level   min   continued education    Pain      N/a           Skin      N/a           Discharge Planning:  Daughter was here yesterday but left after one therpies, suppose to be here today for hands on education in preparation for dc tomorrow. Awaiting team's recommendations for DME and follow up   Team Discussion: Patient post CVA with ataxia, fatigue and drooling.  Patient on target to meet rehab goals: yes, currently needs min assist for dressing. Transfers with a stedy due to ataxia.  Completes bed mobility with min assist and extra time. Needs mod assist to transfer without an assistive device.  Able to ambulate up to 15-20' using a RW with min assist.  Continues to work on speech strategies. Maintained on a dysphagia 3 diet and liquids via provale cup.  Goals for discharge set for min assist overall.   *See Care Plan and progress notes for long and short-term goals.   Revisions to Treatment Plan:  N/a   Teaching Needs: Safety, medications, dietary modification, transfers, toileting, skin care, etc.   Current Barriers to Discharge: Decreased caregiver support, Home enviroment access/layout, and Incontinence  Possible Resolutions to Barriers: Family education DME: hospital bed, TTB, lift chair, stedy Ramp for entry to home     Medical Summary Current Status: HTN, tremors,  class 1 obesity, type 2 diabetes, daytime somnolence, OSA    Barriers to Discharge Comments: HTN, tremors, class 1 obesity, type 2 diabetes, daytime somnolence, OSA Possible Resolutions to [employer] Focus: add propanolol, continue coreg , provided dietary education, d/c insulin , continue glipizide , modafinil  started, continue CPAP   Continued Need for Acute Rehabilitation Level of Care: The patient requires daily medical management by a  physician with specialized training in physical medicine and rehabilitation for the following reasons: Direction of a multidisciplinary physical rehabilitation program to maximize functional independence : Yes Medical management of patient stability for increased activity during participation in an intensive rehabilitation regime.: Yes Analysis of laboratory values and/or radiology reports with any subsequent need for medication adjustment and/or medical intervention. : Yes   I attest that I was present, lead the team conference, and concur with the assessment and plan of the team.   Forrestine Ike B 09/26/2023, 2:40 PM

## 2023-09-26 NOTE — Progress Notes (Signed)
 Physical Therapy Session Note  Patient Details  Name: Khallid Pasillas MRN: [id number] Date of Birth: [birth year]  Today's Date: 09/26/2023 PT Individual Time: 1350-1430 PT Individual Time Calculation (min): 40 min   Short Term Goals: Week 3:  PT Short Term Goal 1 (Week 3): STG = LTG due to ELOS  Skilled Therapeutic Interventions/Progress Updates:        Patient presents in wheelchair and agreeable to therapy session. Pt has no pain. No family present for family education or training. Patient reports no questions re: DC plan or concern for returning home.   Patient transported to main gym for time. Patient asks to work on side stepping, ambulation, and stairs using 1 railing.   Demonstrated side stepping with the stairs with L foot leading ascent and R leading descent with BUE on hand rail. Patient navigated up/down 2x4 stairs (seated rest break) with minA overall with ++ time for ataxia management and coordination of BLE. Patient having more difficulty with descent > ascent due to placement of descending foot and needing min to mod cues for awareness to avoid foot overcrowding.   Gait training 43ft with RW with CGA/minA and RW with assist intermittently for RW management and control. Patient required min cues for keeping body within walker frame and for coordinating his steps due to ataxia. Forward flexed trunk and decreased proximity to RW as he begins to fatigue.   Patient returned to his room at end of session and was left sitting up in wheelchair with his needs met.   Therapy Documentation Precautions:  Precautions Precautions: Fall, Other (comment) (DNR) Precaution/Restrictions Comments: Urinary incontinence, DNR Required Braces or Orthoses: Splint/Cast Splint/Cast: R wrist cock up splint Restrictions Weight Bearing Restrictions Per Provider Order: No RUE Weight Bearing Per Provider Order: Weight bearing as tolerated Other Position/Activity Restrictions: as tolerates General:     Therapy/Group: Individual Therapy  Pheobe Brass 09/26/2023, 7:43 AM

## 2023-09-26 NOTE — Progress Notes (Addendum)
 Patient ID: Allen Dyer, male   DOB: 13-Dec-1935, 88 y.o.   MRN: [id number]  Have ordered via [employer] hospital bed and wheelchair in preparation for home tomorrow.   3;10 PM Daughter missed therapies waiting for lift chair to be delivered to her home. She feels she is good with taking care of Dad. Does want MD to call her she has some questions. Also requested bedside commode for home. Will add to the order from [employer]. Aware of plan to discharge tomorrow.

## 2023-09-26 NOTE — Progress Notes (Signed)
 Inpatient Rehabilitation Care Coordinator Discharge Note   Patient Details  Name: Allen Dyer MRN: [id number] Date of Birth: [birth year]   Discharge location: HOME WITH DAUGHTER WHO IS AWARE OF HIS 24/7 CARE NEEDS  Length of Stay: 22 DAYS  Discharge activity level: MIN-MOD LEVEL  Home/community participation: ACTIVE  Patient response WG:NFAOZH Literacy - How often do you need to have someone help you when you read instructions, pamphlets, or other written material from your doctor or pharmacy?: Never  Patient response YQ:MVHQIO Isolation - How often do you feel lonely or isolated from those around you?: Sometimes  Services provided included: MD, RD, PT, OT, SLP, RN, CM, TR, Pharmacy, SW  Financial Services:  Financial Services Utilized: Medicare    Choices offered to/list presented to: PT AND DAUGHTER  Follow-up services arranged:  Home Health, DME, Patient/Family has no preference for HH/DME agencies Home Health Agency: [facility] PT OT RN SP AIDE    DME : [facility], [facility]   ATTEMPTED TO CONTACT [facility] TO GET AIDE ASSIST FOR PT NEVER RECEIVED CALL BACK FROM HER-DAUGHTER AWARE OF THIS Patient response to transportation need: Is the patient able to respond to transportation needs?: Yes In the past 12 months, has lack of transportation kept you from medical appointments or from getting medications?: No In the past 12 months, has lack of transportation kept you from meetings, work, or from getting things needed for daily living?: No   Patient/Family verbalized understanding of follow-up arrangements:  Yes  Individual responsible for coordination of the follow-up plan: [facility] [phone]  Confirmed correct DME delivered: Mardell Shade 09/26/2023    Comments (or additional information):DAUGHTER MISSED FAMILY TRAINING WAITING ON A LFT CHAIR TO BE DELIVERED TO HER HOME. SHE FELT KNOWS HOW TO  CARE FOR DAD AND DECLINED MORE EDUCATION BEING SET UP. PT IS MUCH CARE AND CONCERNED DAUGHTER DOES NOT REALIZE THIS  Summary of Stay    Date/Time Discharge Planning CSW  09/26/23 [id number] Daughter was here yesterday but left after one therpies, suppose to be here today for hands on education in preparation for dc tomorrow. Awaiting team's recommendations for DME and follow up RGD  09/19/23 0912 Daughter palns to take home and is very committed to Dad. Pt has done better this week and has shown progress from the extension he has had with the CVA RGD  09/11/23 1516 HOme with daughter and son in-law who can assist. Daughter works from home. Pt hopes tpo be doing better than therapists think he will RGD       Kaliope Quinonez G

## 2023-09-26 NOTE — Progress Notes (Signed)
 PROGRESS NOTE   Subjective/Complaints: No new complaints this morning Team conference today Continues to have drooling so will continue scopolamine  patch  ROS: Patient denies fever, rash, sore throat, blurred vision, dizziness, nausea, vomiting, diarrhea, cough, shortness of breath or chest pain, joint or back/neck pain, headache, or mood change. Drooling improved but continues, cognition improved, strength improving  Objective:   No results found.    No results for input(s): "WBC", "HGB", "HCT", "PLT" in the last 72 hours.   No results for input(s): "NA", "K", "CL", "CO2", "GLUCOSE", "BUN", "CREATININE", "CALCIUM " in the last 72 hours.    Intake/Output Summary (Last 24 hours) at 09/26/2023 1111 Last data filed at 09/26/2023 0800 Gross per 24 hour  Intake 476 ml  Output --  Net 476 ml        Physical Exam: Vital Signs Blood pressure (!) 143/75, pulse 64, temperature 98.1 F (36.7 C), temperature source Oral, resp. rate 17, height 5\' 7"  (1.702 m), weight 88.1 kg, SpO2 96%.  Constitutional: No distress . Vital signs reviewed.  Laying in bed.  HEENT: NCAT, EOMI, oral membranes moist, visual deficits noted Neck: supple Cardiovascular: Bradycardia Respiratory/Chest: CTA Bilaterally without wheezes or rales. Normal effort    GI/Abdomen: BS +, non-tender, non-distended Ext: no clubbing, cyanosis, or edema Psych: pleasant and cooperative    Skin: intact; mild right upper extremity swelling/edema  Neuro:very dysarthric ataxic speech, improved conversation and awareness. Moves all 4's but does demonstrate ataxia   No tone or spasticity noted.  Cranial nerves grossly intact.   MSK: R  arm remains in splint,5/5 strength throughout except for right arm in splint  Functional mobility: standing in [place] with OT Miki Alert in shower stall    Physical exam unchanged from the above on reexamination 09/26/23       Assessment/Plan: 1. Functional deficits which require 3+ hours per day of interdisciplinary therapy in a comprehensive inpatient rehab setting. Physiatrist is providing close team supervision and 24 hour management of active medical problems listed below. Physiatrist and rehab team continue to assess barriers to discharge/monitor patient progress toward functional and medical goals  Care Tool:  Bathing    Body parts bathed by patient: Right arm, Chest, Abdomen, Face, Front perineal area, Right upper leg, Left upper leg   Body parts bathed by helper: Right arm, Left arm, Chest, Abdomen, Front perineal area, Buttocks, Right upper leg, Left upper leg, Right lower leg, Left lower leg, Face     Bathing assist Assist Level: Moderate Assistance - Patient 50 - 74%     Upper Body Dressing/Undressing Upper body dressing   What is the patient wearing?: Pull over shirt    Upper body assist Assist Level: Minimal Assistance - Patient > 75%    Lower Body Dressing/Undressing Lower body dressing      What is the patient wearing?: Pants, Incontinence brief     Lower body assist Assist for lower body dressing: Maximal Assistance - Patient 25 - 49%     Toileting Toileting Toileting Activity did not occur (Clothing management and hygiene only): N/A (no void or bm)  Toileting assist Assist for toileting: Total Assistance - Patient < 25%  Transfers Chair/bed transfer  Transfers assist     Chair/bed transfer assist level: Moderate Assistance - Patient 50 - 74%     Locomotion Ambulation   Ambulation assist   Ambulation activity did not occur: Safety/medical concerns  Assist level: Minimal Assistance - Patient > 75% Assistive device: Walker-rolling Max distance: 5ft   Walk 10 feet activity   Assist  Walk 10 feet activity did not occur: Safety/medical concerns  Assist level: Minimal Assistance - Patient > 75% Assistive device: Walker-rolling   Walk 50 feet  activity   Assist Walk 50 feet with 2 turns activity did not occur: Safety/medical concerns  Assist level: Minimal Assistance - Patient > 75% Assistive device: Walker-Eva    Walk 150 feet activity   Assist Walk 150 feet activity did not occur: Safety/medical concerns  Assist level: Minimal Assistance - Patient > 75% Assistive device: Walker-Eva    Walk 10 feet on uneven surface  activity   Assist Walk 10 feet on uneven surfaces activity did not occur: Safety/medical concerns         Wheelchair     Assist Is the patient using a wheelchair?: Yes Type of Wheelchair: Manual    Wheelchair assist level: Dependent - Patient 0%      Wheelchair 50 feet with 2 turns activity    Assist        Assist Level: Dependent - Patient 0%   Wheelchair 150 feet activity     Assist      Assist Level: Dependent - Patient 0%   Blood pressure (!) 143/75, pulse 64, temperature 98.1 F (36.7 C), temperature source Oral, resp. rate 17, height 5\' 7"  (1.702 m), weight 88.1 kg, SpO2 96%.    Medical Problem List and Plan: 1. Functional deficits secondary to bilateral cerebellar infarct, right more than left secondary large vessel disease from severe posterior circulation stenosis             -patient may shower             -ELOS/Goals: 10-12,PT/OT sup to mod I             -Grounds pass ordered  -MRI repeat 5/17, was discussed with neurology Dr. Bonnita Buttner , not acute cva but some progression of prior strokes. No additional changes, avoid hypotension   Continue CIR  Continue D3 2,000U daily  Discussed progress with therapy- he is improving in speech, standing, ability to use Provale cup independently  5-31: See palliative care note for full rec; DNR/DNI with limited interventions.  2.  Impaired mobility: continue Lovenox              -antiplatelet therapy: Aspirin  81 mg daily and Plavix  75 mg day x 3 months then Plavix  alone  3. Pain: Denies pain, decrease gabapentin  to 100mg   HS  4. Mood/Behavior/Sleep: Provide emotional support             -antipsychotic agents: N/A  5. Depression:  -lexapro  d/ced given lethargy  -mood has been positive  -continue metanx   6. Skin/Wound Care: Routine skin checks  5-19: Right upper extremity duplex ordered prior for swelling negative for DVT, however with multiple cystic structures concerning for abscess.  CT reviewed and showed triquetral fracture, ortho consulted and splint placed  7. Fluids/Electrolytes/Nutrition: Routine in and outs with follow-up chemistries 8.  Klebsiella oxytoca UTI.  Completing course of Duricef  - Per urology note, is to continue on cefadroxil  1 tab p.o. nightly for CAUTI prophylaxis while Foley is in place  9.  CKD stage IV.  Discussed that creatinine is improving  -Cr overalls stable 2.47/BUN 50- monitor  5-26 Cr in range of recent labs  10.  Chronic combined systolic and diastolic congestive heart failure. EF 25-30% Monitor for any signs of fluid overload.  Follow-up cardiology services.  Continue Demadex  60 mg twice daily, weight reviewed and is increased, daily weights ordered, weights are stable/increased, continue current regimen  5/17 wt trending up although suspect may be inaccurate, no signs of overload noted  5/18 Wt appears stable, monitor  5/19: weights downtrending, ? Stroke recrudescence with normotension d/t stenosis, reduce torsemide  to 60 mg daily  5/26 sl downward trend still. Was eating well this morning -continue to encourage intake  5/31: Weights down a little bit.  Monitor. 6/1: Weights likely inaccurate - appears euvolemic  Filed Weights   09/25/23 0417 09/26/23 0500 09/26/23 0527  Weight: 88.3 kg 86.8 kg 88.1 kg     11.  Nonischemic cardiomyopathy.  Followed by cardiology service Dr. Micael Adas.  Continue Imdur  30 mg daily and Coreg  6.25 mg twice daily.  See #10.  12.  History of prostate cancer, BPH.  Followed by Dr. Freddi Jaeger.  Flomax  0.4 mg twice daily             -Check  PVR/bladder scan  5/17-8 he has foley in placed, clear yellow urine  5/26 pt is emptying incontinently--continues  13.  Diabetes mellitus with peripheral neuropathy and hemoglobin A1c 7.0.  Glucotrol  5 mg daily, d/c ISS, d/c semglee   - Blood sugars well-controlled CBG (last 3)  Recent Labs    09/25/23 1631 09/25/23 2156 09/26/23 0611  GLUCAP 174* 151* 85     14. HTN. Long term goal normotensive. Home meds carvedilol , imdur , toresemide  -5/18 would like avoid hypotension, decrease Imdur  to 15 mg, decrease Coreg  to 3.125 twice daily, decrease torsemide  to daily  5/19: Weight down, BP normotensive but may be underperfusing; reduce torsemide  as above. Can consider reducing flomax  as well pending DC foley trial but cannot stay off long term d/t chronic retention.  5/26 bp controlled  6/1: A little soft ,back up this AM, monitor and consider reducing torsemide   6/4: add propanolol   Vitals:   09/25/23 1941 09/26/23 0527  BP: 135/73 (!) 143/75  Pulse: 64 64  Resp: 18 17  Temp: 98 F (36.7 C) 98.1 F (36.7 C)  SpO2: 98% 96%      15. Obesity. Body mass index is 33.67 kg/m.             -dietary education  16. R wrist pain due to fall. Wearing wrist brace. Xray without fracture. Voltaren  gel scheduled             -tylenol  prn  -5/17 Will check vas US  due to R arm swelling  5-19: See above; no DVT, possible abscesses/cystic structures.    5/20: CT reviewed and shows age indeterminate fracture, will consult ortho  17. Constipation: D/c miralax , d/c [place], encouraged prune juice, asked nursing to bring prune juice for him  - resolved  18. Dizziness/vertigo/nystagmus: much improved, continue scopolamine  patch  19. Anemia: Hgb reviewed and was stable last check, no new concerning findings to warrant repeat at this time  20. AMS: resolved  21. Drooling.    - continue Scopalamine 1.5 mg patch  22. Lethargy: modafinil  ordered, helping, continue  23. Penile swelling: placed  order to d/c foley, improved  24. Constipation: d/c metamucil, d/c imodium, added colace daily, asked nursing  to bring him prune juice  25, Bradycardia: resolved  26. Hypotension: resolved  27. Post-stroke visual deficits: continue to monitor  28. Tremor: propanolol started        LOS: 21 days A FACE TO FACE EVALUATION WAS PERFORMED  Emiko Osorto P Ellan Tess 09/26/2023, 11:11 AM

## 2023-09-26 NOTE — Progress Notes (Signed)
 Occupational Therapy Discharge Summary  Patient Details  Name: Allen Dyer MRN: [id number] Date of Birth: 06-14-1935  Date of Discharge from OT service:September 26, 2023  Today's Date: 09/26/2023 OT Individual Time: 0831 - 0945 and [id number] Treatment Time:  74 min and 41 min    Skilled Therapeutic Intervention:   AM Session:  Patient received supported sitting in bed receiving meds from nursing.  Patient agreeable to shower, aware that he is scheduled to discharge tomorrow.  Patient completing sit to stand with stedy lift and contact guard.  Transferred to shower bench.  Patient able to bathe himself while seated with use if grab bar to stand to cleanse bottom.  Patient stands with ataxia, but safely uses bar to maintain balance.  Patient with significant improvement in participation with BADL and used reacher and shoe funnel to increase independence.  He would benefit from elastic shoelaces as well - will speak with daughter.   Patient pleased with progress and working on isolated control in left/ right arm and leg.  Patient identifies improved control with left limbs as compared to right limbs.   Patient left up in wheelchair with chair pad alarm in place and engaged and personal items/ call bell in reach.    PM Session:  Patient received seated in wheelchair finishing lunch.  Patient's nurse in room while patient eating.  Patient assisted to finish lunch, then indicated need to use the bathroom.  Patient's brief soaked, and pants and wheelchair cushion wet.  Patient walked to bathroom with mod assist with RW.  Patient challenged by slight incline into bathroom, as well as turning to orient to sit on toilet.  Patient needing mod assist to transition to sitting from standing.  Patient voided on toilet, and then assisted to clean and redress lower body and ontain a clean/dry cushion.  Patient's daughter scheduled to be here for education, however she is not here this session.  Patient left up in  wheelchair - direct hand off to NT obtaining vitals.  Sent message to MD/PA to determine if we could transition back to removable splint for right wrist.  Awaiting return message.    Patient has met 11 of 11 long term goals due to improved activity tolerance, improved balance, postural control, ability to compensate for deficits, functional use of  RIGHT upper, RIGHT lower, LEFT upper, and LEFT lower extremity, improved attention, and improved awareness.  Patient to discharge at overall Mod Assist level.  Patient's care partner is independent to provide the necessary physical assistance at discharge.    Reasons goals not met: NA  Recommendation:  Patient will benefit from ongoing skilled OT services in home health setting to continue to advance functional skills in the area of BADL and Reduce care partner burden.  Equipment: Sit to stand lift, shower seat - tbd, recommend reacher, shoe funnel, elastic laces  Reasons for discharge: treatment goals met and discharge from hospital  Patient/family agrees with progress made and goals achieved: Yes  OT Discharge Precautions/Restrictions  Precautions Precautions: Fall Precaution/Restrictions Comments: Ataxia, Urinary incontinence, DNR Required Braces or Orthoses: Splint/Cast Splint/Cast: r wrist removable cast for triquetrium fracture Restrictions Weight Bearing Restrictions Per Provider Order: No RUE Weight Bearing Per Provider Order: Weight bearing as tolerated   AM Pain Pain Assessment Pain Scale: 0-10 Pain Score: 0-No pain PM PAIN Pain Assessment Pain Scale: 0-10 Pain Score: 0-No pain ADL ADL Equipment Provided: Reacher (Shoe collar) Eating: Supervision/safety Where Assessed-Eating: Chair Grooming: Supervision/safety Where Assessed-Grooming: Sitting at sink Upper Body  Bathing: Minimal assistance Where Assessed-Upper Body Bathing: Shower Lower Body Bathing: Minimal assistance Where Assessed-Lower Body Bathing: Shower Upper  Body Dressing: Setup Where Assessed-Upper Body Dressing: Sitting at sink Lower Body Dressing: Minimal assistance Where Assessed-Lower Body Dressing: Sitting at sink, Standing at sink Toileting: Moderate assistance Where Assessed-Toileting: Teacher, adult education: Moderate assistance (mod assist stand step, or use of stedy) Toilet Transfer Method: Stand pivot, Other (comment) (stedy - sit to stand lift) Tub/Shower Transfer: Unable to assess Film/video editor: Moderate assistance (stand step mod or sit to stand lift/stedy with one person assist) Film/video editor Method: Stand pivot [employer] Equipment: Transfer tub bench ADL Comments: daughter remodeling bathroom unsure of shower set up Vision Baseline Vision/History: 1 Wears glasses Patient Visual Report: Other (comment) (extraneous eye movements but patient reports decreased or resolved symptoms) Vision Assessment?: Vision impaired- to be further tested in functional context Eye Alignment: Within Functional Limits Ocular Range of Motion: Restricted on the left;Restricted on the right;Restricted looking up Alignment/Gaze Preference: Within Defined Limits Tracking/Visual Pursuits: Decreased smoothness of vertical tracking;Decreased smoothness of horizontal tracking Saccades: Additional eye shifts occurred during testing;Additional head turns occurred during testing;Overshoots;Undershoots Convergence: Impaired (comment) Visual Fields: No apparent deficits Additional Comments: poor visual fixation - although patient managing more spontaneously Perception  Perception: Impaired Praxis Praxis: WFL Cognition Cognition Overall Cognitive Status: Impaired/Different from baseline Arousal/Alertness: Awake/alert Orientation Level: Person;Place;Situation Person: Oriented Place: Oriented Situation: Oriented Memory: Appears intact Attention: Selective;Alternating Focused Attention: Appears intact Sustained Attention: Appears  intact Selective Attention: Appears intact Selective Attention Impairment: Functional basic Awareness: Appears intact Awareness Impairment: Anticipatory impairment Problem Solving: Appears intact Executive Function: Initiating Initiating: Impaired Initiating Impairment: Functional basic Safety/Judgment: Appears intact Brief Interview for Mental Status (BIMS) Repetition of Three Words (First Attempt): 3 Temporal Orientation: Year: Correct Temporal Orientation: Month: Accurate within 5 days Temporal Orientation: Day: Correct Recall: "Sock": Yes, no cue required Recall: "Blue": Yes, no cue required Recall: "Bed": Yes, no cue required BIMS Summary Score: 15 Sensation Sensation Light Touch: Appears Intact Hot/Cold: Appears Intact Proprioception: Impaired Detail Proprioception Impaired Details: Impaired RLE;Impaired RUE Stereognosis: Impaired Detail Stereognosis Impaired Details: Impaired RUE;Impaired LUE Additional Comments: ataxia both proximally and distally Coordination Gross Motor Movements are Fluid and Coordinated: No Fine Motor Movements are Fluid and Coordinated: No Coordination and Movement Description: Truncal and BLE ataxia, generalized weakness Motor  Motor Motor: Abnormal postural alignment and control;Hemiplegia;Motor apraxia;Ataxia Motor - Skilled Clinical Observations: mild R hemi Mobility  Bed Mobility Bed Mobility: Sit to Supine;Supine to Sit Supine to Sit: Minimal Assistance - Patient > 75% Sit to Supine: Minimal Assistance - Patient > 75% Transfers Sit to Stand: Minimal Assistance - Patient > 75% (with UE support - eg grab bar) Stand to Sit: Minimal Assistance - Patient > 75%  Trunk/Postural Assessment  Cervical Assessment Cervical Assessment: Exceptions to [facility] Thoracic Assessment Thoracic Assessment: Exceptions to [facility] Lumbar Assessment Lumbar Assessment: Exceptions to [facility] Postural Control Postural Control: Deficits on evaluation Trunk Control:  Strong Posterior Bias, difficulty with forward flexion  Balance Balance Balance Assessed: Yes Static Sitting Balance Static Sitting - Balance Support: Feet supported;No upper extremity supported Static Sitting - Level of Assistance: 5: Stand by assistance Dynamic Sitting Balance Dynamic Sitting - Balance Support: Feet supported;No upper extremity supported Dynamic Sitting - Level of Assistance: 4: Min Oncologist Standing - Balance Support: Bilateral upper extremity supported;During functional activity Static Standing - Level of Assistance: 4: Min assist Dynamic Standing Balance Dynamic Standing - Balance Support: During functional activity;Bilateral  upper extremity supported Dynamic Standing - Level of Assistance: 3: Mod assist Extremity/Trunk Assessment RUE Assessment RUE Assessment: Exceptions to [facility] Active Range of Motion (AROM) Comments: Raises arms to ~ !00* shoulder flexion - reports this is premorbid General Strength Comments: NT RUE Body System: Neuro Brunstrum levels for arm and hand: Arm;Hand Brunstrum level for arm: Stage V Relative Independence from Synergy Brunstrum level for hand: Stage VI Isolated joint movements LUE Assessment LUE Assessment: Exceptions to [facility] Active Range of Motion (AROM) Comments: Raises arms to ~ !00* shoulder flexion - reports this is premorbid LUE Body System: Neuro Brunstrum levels for arm and hand: Arm;Hand Brunstrum level for arm: Stage V Relative Independence from Synergy Brunstrum level for hand: Stage VI Isolated joint movements   Aubrey Leaf M 09/26/2023, 9:49 AM

## 2023-09-26 NOTE — Plan of Care (Signed)
  Problem: RH Swallowing Goal: LTG Patient will consume least restrictive diet using compensatory strategies with assistance (SLP) Description: LTG:  Patient will consume least restrictive diet using compensatory strategies with assistance (SLP) Outcome: Completed/Met   Problem: RH Cognition - SLP Goal: RH LTG Patient will demonstrate orientation with cues Description:  LTG:  Patient will demonstrate orientation to person/place/time/situation with cues (SLP)   Outcome: Completed/Met   Problem: RH Expression Communication Goal: LTG Patient will increase speech intelligibility (SLP) Description: LTG: Patient will increase speech intelligibility at word/phrase/conversation level with cues, % of the time (SLP) Outcome: Completed/Met   Problem: RH Problem Solving Goal: LTG Patient will demonstrate problem solving for (SLP) Description: LTG:  Patient will demonstrate problem solving for basic/complex daily situations with cues  (SLP) Outcome: Completed/Met   Problem: RH Memory Goal: LTG Patient will demonstrate ability for day to day (SLP) Description: LTG:   Patient will demonstrate ability for day to day recall/carryover during cognitive/linguistic activities with assist  (SLP) Outcome: Completed/Met   Problem: RH Attention Goal: LTG Patient will demonstrate this level of attention during functional activites (SLP) Description: LTG:  Patient will will demonstrate this level of attention during functional activites (SLP) Outcome: Completed/Met

## 2023-09-26 NOTE — Progress Notes (Signed)
 Speech Language Pathology Discharge Summary  Patient Details  Name: Allen Dyer MRN: [id number] Date of Birth: 09-04-1935  Date of Discharge from SLP service:September 26, 2023  Today's Date: 09/26/2023 SLP Individual Time: 0865-7846 SLP Individual Time Calculation (min): 23 min  Skilled Therapeutic Interventions:  SLP conducted skilled therapy session targeting communication and dysphagia goals. Throughout session, patient sipped thin liquids from Provale cup (10cc) with no overt s/sx of penetration/aspiration. Patient engaged in speaking task where he benefited from supervision assist to maintain increased volume during reading passage. Patient was left in room with call bell in reach and alarm set. SLP will continue to target goals per plan of care.     Patient has met 6 of 6 long term goals.  Patient to discharge at overall Supervision level.  Reasons goals not met: n/a   Clinical Impression/Discharge Summary:  Patient has made excellent progress towards therapy goals and has met 6/6 long term goals set for duration of admission. Patient is overall supervision to compensate for motor speech deficits utilizing SLOP speech intelligibility strategies, supervision for problem solving and recall, and supervision for tolerance of Dys3/thin liquid diet with use of 10cc provale cup for liquids. Patient would benefit from continued SLP services at next venue of care to target lingering deficits. Patient and family education complete. SLP will sign off.   Care Partner:  Caregiver Able to Provide Assistance: Yes  Type of Caregiver Assistance: Cognitive  Recommendation:  Home Health SLP;Outpatient SLP;24 hour supervision/assistance  Rationale for SLP Follow Up: Maximize functional communication;Maximize cognitive function and independence;Maximize swallowing safety   Equipment: n/a   Reasons for discharge: Discharged from hospital   Patient/Family Agrees with Progress Made and Goals Achieved: Yes    [name], M.A., CCC-SLP   [name] 09/26/2023, 3:00 PM

## 2023-09-27 LAB — GLUCOSE, CAPILLARY
Glucose-Capillary: 91 mg/dL (ref 70–99)
Glucose-Capillary: 203 mg/dL — ABNORMAL HIGH (ref 70–99)

## 2023-09-27 NOTE — Plan of Care (Signed)
 Remains incontinent of bowel and bladder despite toileting protocol

## 2023-09-27 NOTE — Progress Notes (Signed)
 Patient ID: Allen Dyer, male   DOB: 12/17/1935, 88 y.o.   MRN: [id number] pt and daughter declined the hospital bed and aware awaiting wheelchair and bedside commode. Daughter would like ambulance home to get in. Awaiting daughter to come to the hospital

## 2023-09-27 NOTE — Progress Notes (Signed)
 Patient ID: Allen Dyer, male   DOB: 02-09-1936, 88 y.o.   MRN: [id number]  Spoke with daughter-Allen Dyer via telephone to let now she has a copay for the equipment and gave her the number to call to make this co-pay. Once this is paid then can set up delivery for bed and wheelchair. Hopefully can get out there today. Daughter reports she has some things to do before he comes home. Now being told will go home in ambulance and will ned to make sure bed is there prior to him discharging. Talk to her in a few hours to see what she finds out from [employer]

## 2023-09-27 NOTE — Progress Notes (Signed)
 Inpatient Rehabilitation Discharge Medication Review by a Pharmacist   A complete drug regimen review was completed for this patient to identify any potential clinically significant medication issues.  High Risk Drug Classes Is patient taking? Indication by Medication  Antipsychotic No   Anticoagulant No   Antibiotic No    Opioid No   Antiplatelet Yes Aspirin , plavix  x 3 months then Plavix  alone (ASA end date 11/25/23) - acute  CVA, CVA prophylaxis, CAD  Hypoglycemics/insulin  Yes Glipizide  -DM  Vasoactive Medication Yes Coreg , Imdur  - Nonischemic cardiomyopathy, HTN Torsemide  - HF/HTN Propranolol- HTN Flomax - h/o prostate cancer, BPH  Chemotherapy No   Other Yes Acetaminophen , diclofenac  gel - pain Docusate -constipation Gabapentin - neuropathic pain Meclizine  - prn dizziness Modafinil - mood stabilizer Klor-con : potassium  Supplement Scopolamine  - Dizziness/ vertigo/nystagmus  Vitamin C, D, calcium , fish oil - supplements     Type of Medication Issue Identified Description of Issue Recommendation(s)  Drug Interaction(s) (clinically significant)     Duplicate Therapy     Allergy     No Medication Administration End Date  Aspirin , plavix  x 3 months then Plavix  alone (ASA end date 11/25/23)   Communicate to patient /family/ caregiver prior to discharge. It is noted to stop taking in the discharge AVS.    Incorrect Dose     Additional Drug Therapy Needed     Significant med changes from prior encounter (inform family/care partners about these prior to discharge). PTA medication:  Hydralazine  discontinued.  Gabapentin  dose reduced to 100mg  daily at bedtime.  Communicate to patient /family/ caregiver prior to discharge and on  discharge AVS.     Other       Clinically significant medication issues were identified that warrant physician communication and completion of prescribed/recommended actions by midnight of the next day:  No  Name of provider notified for urgent issues  identified:   Provider Method of Notification:    Pharmacist comments:   Time spent performing this drug regimen review (minutes):  30   Alisa Irish, [place] Clinical Pharmacist 09/27/2023 7:56 AM

## 2023-09-27 NOTE — Progress Notes (Signed)
 PROGRESS NOTE   Subjective/Complaints: Discussed medication changes that were made with daughter Patient has no new concerns and has made great progress!  ROS: Patient denies fever, rash, sore throat, blurred vision, dizziness, nausea, vomiting, diarrhea, cough, shortness of breath or chest pain, joint or back/neck pain, headache, or mood change. Drooling improved but continues, cognition improved, strength improving  Objective:   No results found.    No results for input(s): "WBC", "HGB", "HCT", "PLT" in the last 72 hours.   No results for input(s): "NA", "K", "CL", "CO2", "GLUCOSE", "BUN", "CREATININE", "CALCIUM " in the last 72 hours.    Intake/Output Summary (Last 24 hours) at 09/27/2023 0959 Last data filed at 09/27/2023 0824 Gross per 24 hour  Intake 360 ml  Output --  Net 360 ml        Physical Exam: Vital Signs Blood pressure (!) 142/78, pulse 82, temperature 97.9 F (36.6 C), temperature source Oral, resp. rate 17, height 5\' 7"  (1.702 m), weight 91.2 kg, SpO2 97%.  Constitutional: No distress . Vital signs reviewed.  Laying in bed.  HEENT: NCAT, EOMI, oral membranes moist, visual deficits noted Neck: supple Cardiovascular: Bradycardia Respiratory/Chest: CTA Bilaterally without wheezes or rales. Normal effort    GI/Abdomen: BS +, non-tender, non-distended Ext: no clubbing, cyanosis, or edema Psych: pleasant and cooperative    Skin: intact; mild right upper extremity swelling/edema  Neuro:very dysarthric ataxic speech, improved conversation and awareness. Moves all 4's but does demonstrate ataxia   No tone or spasticity noted.  Cranial nerves grossly intact.   MSK: R  arm remains in splint MMT: 5/5 strength except for right arm which is in splint  Functional mobility: standing in [place] with OT Miki Alert in shower stall    Physical exam unchanged from the above on reexamination 09/27/23       Assessment/Plan: 1. Functional deficits which require 3+ hours per day of interdisciplinary therapy in a comprehensive inpatient rehab setting. Physiatrist is providing close team supervision and 24 hour management of active medical problems listed below. Physiatrist and rehab team continue to assess barriers to discharge/monitor patient progress toward functional and medical goals  Care Tool:  Bathing    Body parts bathed by patient: Right arm, Left arm, Chest, Abdomen, Front perineal area, Buttocks, Right upper leg, Left upper leg, Face   Body parts bathed by helper: Left lower leg, Right lower leg     Bathing assist Assist Level: Minimal Assistance - Patient > 75%     Upper Body Dressing/Undressing Upper body dressing   What is the patient wearing?: Pull over shirt    Upper body assist Assist Level: Set up assist    Lower Body Dressing/Undressing Lower body dressing      What is the patient wearing?: Pants, Incontinence brief     Lower body assist Assist for lower body dressing: Minimal Assistance - Patient > 75%     Toileting Toileting Toileting Activity did not occur (Clothing management and hygiene only): N/A (no void or bm)  Toileting assist Assist for toileting: Moderate Assistance - Patient 50 - 74%     Transfers Chair/bed transfer  Transfers assist     Chair/bed transfer assist level:  Moderate Assistance - Patient 50 - 74%     Locomotion Ambulation   Ambulation assist   Ambulation activity did not occur: Safety/medical concerns  Assist level: Minimal Assistance - Patient > 75% Assistive device: Walker-rolling Max distance: 24ft   Walk 10 feet activity   Assist  Walk 10 feet activity did not occur: Safety/medical concerns  Assist level: Minimal Assistance - Patient > 75% Assistive device: Walker-rolling   Walk 50 feet activity   Assist Walk 50 feet with 2 turns activity did not occur: Safety/medical concerns  Assist level:  Minimal Assistance - Patient > 75% Assistive device: Walker-rolling    Walk 150 feet activity   Assist Walk 150 feet activity did not occur: Safety/medical concerns  Assist level: Minimal Assistance - Patient > 75% Assistive device: Walker-Eva    Walk 10 feet on uneven surface  activity   Assist Walk 10 feet on uneven surfaces activity did not occur: Safety/medical concerns         Wheelchair     Assist Is the patient using a wheelchair?: Yes Type of Wheelchair: Manual    Wheelchair assist level: Dependent - Patient 0%      Wheelchair 50 feet with 2 turns activity    Assist        Assist Level: Dependent - Patient 0%   Wheelchair 150 feet activity     Assist      Assist Level: Dependent - Patient 0%   Blood pressure (!) 142/78, pulse 82, temperature 97.9 F (36.6 C), temperature source Oral, resp. rate 17, height 5\' 7"  (1.702 m), weight 91.2 kg, SpO2 97%.    Medical Problem List and Plan: 1. Functional deficits secondary to bilateral cerebellar infarct, right more than left secondary large vessel disease from severe posterior circulation stenosis             -patient may shower             -ELOS/Goals: 10-12,PT/OT sup to mod I             -Grounds pass ordered  -MRI repeat 5/17, was discussed with neurology Dr. Bonnita Buttner , not acute cva but some progression of prior strokes. No additional changes, avoid hypotension   D/c home  Continue D3 2,000U daily  Discussed progress with therapy- he is improving in speech, standing, ability to use [place] cup independently  5-31: See palliative care note for full rec; DNR/DNI with limited interventions.  2.  Impaired mobility: d/c lovenox  upon discharge             -antiplatelet therapy: Aspirin  81 mg daily and Plavix  75 mg day x 3 months then Plavix  alone  3. Pain: Denies pain, decrease gabapentin  to 100mg  HS, discussed with daughter that this can be changed to prn at home  4. Mood/Behavior/Sleep:  Provide emotional support             -antipsychotic agents: N/A  5. Depression:  -lexapro  d/ced given lethargy  -mood has been positive  -continue metanx, discussed with daughter that this can be replaced with green leafy vegetables at home   6. Skin/Wound Care: Routine skin checks  5-19: Right upper extremity duplex ordered prior for swelling negative for DVT, however with multiple cystic structures concerning for abscess.  CT reviewed and showed triquetral fracture, ortho consulted and splint placed  7. Fluids/Electrolytes/Nutrition: Routine in and outs with follow-up chemistries 8.  Klebsiella oxytoca UTI.  Completing course of Duricef  - Per urology note, is to continue  on cefadroxil  1 tab p.o. nightly for CAUTI prophylaxis while Foley is in place  9.  CKD stage IV.  Discussed that creatinine is improving  -Cr overalls stable 2.47/BUN 50- monitor  5-26 Cr in range of recent labs  10.  Chronic combined systolic and diastolic congestive heart failure. EF 25-30% Monitor for any signs of fluid overload.  Follow-up cardiology services.  Continue Demadex  60 mg twice daily, weight reviewed and is increased, daily weights ordered, weights are stable/increased, continue current regimen  5/17 wt trending up although suspect may be inaccurate, no signs of overload noted  5/18 Wt appears stable, monitor  5/19: weights downtrending, ? Stroke recrudescence with normotension d/t stenosis, reduce torsemide  to 60 mg daily  5/26 sl downward trend still. Was eating well this morning -continue to encourage intake  6/5 weight increased, does not appear fluid overloaded, discussed with daughter monitoring lower extremities for edema, monitoring for shortness of breath, continuing torsemide  30mg  daily, following up with PCP and nephrologist  [facility] Weights   09/26/23 0500 09/26/23 0527 09/27/23 0419  Weight: 86.8 kg 88.1 kg 91.2 kg     11.  Nonischemic cardiomyopathy.  Followed by cardiology service  Dr. Micael Adas.  Continue Imdur  30 mg daily and Coreg  6.25 mg twice daily.  See #10.  12.  History of prostate cancer, BPH.  Followed by Dr. Freddi Jaeger.  Flomax  0.4 mg twice daily             -Check PVR/bladder scan  5/17-8 he has foley in placed, clear yellow urine  5/26 pt is emptying incontinently--continues  13.  Diabetes mellitus with peripheral neuropathy and hemoglobin A1c 7.0.  Glucotrol  5 mg daily, d/c ISS, d/c semglee   - Blood sugars well-controlled CBG (last 3)  Recent Labs    09/26/23 1719 09/26/23 1955 09/27/23 0615  GLUCAP 246* 282* 91     14. HTN. Long term goal normotensive. Home meds carvedilol , imdur , toresemide  -5/18 would like avoid hypotension, decrease Imdur  to 15 mg, decrease Coreg  to 3.125 twice daily, decrease torsemide  to daily  5/19: Weight down, BP normotensive but may be underperfusing; reduce torsemide  as above. Can consider reducing flomax  as well pending DC foley trial but cannot stay off long term d/t chronic retention.  5/26 bp controlled  6/1: A little soft ,back up this AM, monitor and consider reducing torsemide   6/4: add propanolol   Vitals:   09/27/23 0419 09/27/23 0806  BP: 126/66 (!) 142/78  Pulse: 65 82  Resp: 16 17  Temp: 97.9 F (36.6 C)   SpO2: 95% 97%      15. Obesity. Body mass index is 33.67 kg/m.             -dietary education  16. R wrist pain due to fall. Wearing wrist brace. Xray without fracture. Voltaren  gel scheduled             -tylenol  prn  -5/17 Will check vas US  due to R arm swelling  5-19: See above; no DVT, possible abscesses/cystic structures.    5/20: CT reviewed and shows age indeterminate fracture, will consult ortho  17. Constipation: D/c miralax , d/c [place], encouraged prune juice, asked nursing to bring prune juice for him  - resolved  18. Dizziness/vertigo/nystagmus: much improved, continue scopolamine  patch  19. Anemia: Hgb reviewed and was stable last check, no new concerning findings to warrant repeat  at this time  20. AMS: resolved  21. Drooling.    - continue Scopalamine 1.5 mg patch  22. Lethargy: modafinil  ordered, helping, continue  23. Penile swelling: placed order to d/c foley, improved  24. Constipation: d/c metamucil, d/c imodium, added colace daily, asked nursing to bring him prune juice  25, Bradycardia: resolved  26. Hypotension: resolved  27. Post-stroke visual deficits: continue to monitor  28. Tremor: propanolol started    >30 minutes spent in discharge of patient including review of medications and follow-up appointments, physical examination, and in answering all patient's questions      LOS: 22 days A FACE TO FACE EVALUATION WAS PERFORMED  Yoanna Jurczyk P Adreyan Carbajal 09/27/2023, 9:59 AM

## 2023-09-28 ENCOUNTER — Encounter: Attending: Physical Medicine and Rehabilitation | Admitting: Physical Medicine and Rehabilitation

## 2023-09-28 DIAGNOSIS — I6523 Occlusion and stenosis of bilateral carotid arteries: Secondary | ICD-10-CM | POA: Diagnosis not present

## 2023-09-28 DIAGNOSIS — I69352 Hemiplegia and hemiparesis following cerebral infarction affecting left dominant side: Secondary | ICD-10-CM | POA: Diagnosis not present

## 2023-09-28 DIAGNOSIS — E669 Obesity, unspecified: Secondary | ICD-10-CM | POA: Diagnosis not present

## 2023-09-28 DIAGNOSIS — I2510 Atherosclerotic heart disease of native coronary artery without angina pectoris: Secondary | ICD-10-CM | POA: Diagnosis not present

## 2023-09-28 DIAGNOSIS — I5042 Chronic combined systolic (congestive) and diastolic (congestive) heart failure: Secondary | ICD-10-CM | POA: Diagnosis not present

## 2023-09-28 DIAGNOSIS — I130 Hypertensive heart and chronic kidney disease with heart failure and stage 1 through stage 4 chronic kidney disease, or unspecified chronic kidney disease: Secondary | ICD-10-CM | POA: Diagnosis not present

## 2023-09-28 DIAGNOSIS — E785 Hyperlipidemia, unspecified: Secondary | ICD-10-CM | POA: Diagnosis not present

## 2023-09-28 DIAGNOSIS — B961 Klebsiella pneumoniae [K. pneumoniae] as the cause of diseases classified elsewhere: Secondary | ICD-10-CM | POA: Diagnosis not present

## 2023-09-28 DIAGNOSIS — N184 Chronic kidney disease, stage 4 (severe): Secondary | ICD-10-CM | POA: Diagnosis not present

## 2023-09-28 DIAGNOSIS — R0601 Orthopnea: Secondary | ICD-10-CM

## 2023-09-28 DIAGNOSIS — M109 Gout, unspecified: Secondary | ICD-10-CM | POA: Diagnosis not present

## 2023-09-28 DIAGNOSIS — E1142 Type 2 diabetes mellitus with diabetic polyneuropathy: Secondary | ICD-10-CM | POA: Diagnosis not present

## 2023-09-28 DIAGNOSIS — I69351 Hemiplegia and hemiparesis following cerebral infarction affecting right dominant side: Secondary | ICD-10-CM | POA: Diagnosis not present

## 2023-09-28 DIAGNOSIS — I272 Pulmonary hypertension, unspecified: Secondary | ICD-10-CM | POA: Diagnosis not present

## 2023-09-28 DIAGNOSIS — I69392 Facial weakness following cerebral infarction: Secondary | ICD-10-CM | POA: Diagnosis not present

## 2023-09-28 DIAGNOSIS — K5900 Constipation, unspecified: Secondary | ICD-10-CM | POA: Diagnosis not present

## 2023-09-28 DIAGNOSIS — I428 Other cardiomyopathies: Secondary | ICD-10-CM | POA: Diagnosis not present

## 2023-09-28 DIAGNOSIS — N390 Urinary tract infection, site not specified: Secondary | ICD-10-CM | POA: Diagnosis not present

## 2023-09-28 DIAGNOSIS — E1122 Type 2 diabetes mellitus with diabetic chronic kidney disease: Secondary | ICD-10-CM | POA: Diagnosis not present

## 2023-09-28 DIAGNOSIS — N401 Enlarged prostate with lower urinary tract symptoms: Secondary | ICD-10-CM | POA: Diagnosis not present

## 2023-09-28 DIAGNOSIS — G4733 Obstructive sleep apnea (adult) (pediatric): Secondary | ICD-10-CM | POA: Diagnosis not present

## 2023-09-28 DIAGNOSIS — K219 Gastro-esophageal reflux disease without esophagitis: Secondary | ICD-10-CM | POA: Diagnosis not present

## 2023-09-28 DIAGNOSIS — R338 Other retention of urine: Secondary | ICD-10-CM | POA: Diagnosis not present

## 2023-09-28 DIAGNOSIS — E1151 Type 2 diabetes mellitus with diabetic peripheral angiopathy without gangrene: Secondary | ICD-10-CM | POA: Diagnosis not present

## 2023-09-28 DIAGNOSIS — R1312 Dysphagia, oropharyngeal phase: Secondary | ICD-10-CM | POA: Diagnosis not present

## 2023-09-28 DIAGNOSIS — I453 Trifascicular block: Secondary | ICD-10-CM | POA: Diagnosis not present

## 2023-09-28 DIAGNOSIS — I471 Supraventricular tachycardia, unspecified: Secondary | ICD-10-CM | POA: Diagnosis not present

## 2023-09-28 NOTE — Progress Notes (Signed)
  Subjective:     Patient ID: Allen Dyer, male   DOB: 1935/08/24, 88 y.o.   MRN: [id number]  HPI An audio/video tele-health visit is felt to be the most appropriate encounter for this patient at this time. This is a follow up tele-visit via phone. The patient is at home. MD is at office. Prior to scheduling this appointment, our staff discussed the limitations of evaluation and management by telemedicine and the availability of in-person appointments. The patient expressed understanding and agreed to proceed.   Mr. Clois is an 88 year old man who presents for hospital follow-up of orthopnea.  1) Orthopnea: -daughter notes that patient complained of shortness of breath last night when he was laid flat. She notes that when the head of his bed was elevated his shortness of breath resolved.  -he was given torsemide  30mg  this morning -he has been doing really well otherwise -his daughter has 20mg  torsemide  tablets on hand that he had been prescribed prior to admission   Review of Systems +orthopnea    Objective:   Physical Exam    Not performed Assessment:         Plan:     1) Orthopnea: -discussed his orthopnea, discussed that this could be 2/2 to his CHF, recommended that he continue to sleep with the head of his bed elevated -recommended daily weights  -recommended checking daily blood pressures -recommended giving 20mg  extra torsemide  today and then continuing with 30mg  daily dose  5 minutes spent in discussion of his orthopnea, discussed that this could be 2/2 to his CHF, recommended that he continue to sleep with the head of his bed elevated

## 2023-10-01 DIAGNOSIS — I5042 Chronic combined systolic (congestive) and diastolic (congestive) heart failure: Secondary | ICD-10-CM | POA: Diagnosis not present

## 2023-10-01 DIAGNOSIS — N184 Chronic kidney disease, stage 4 (severe): Secondary | ICD-10-CM | POA: Diagnosis not present

## 2023-10-01 DIAGNOSIS — I69351 Hemiplegia and hemiparesis following cerebral infarction affecting right dominant side: Secondary | ICD-10-CM | POA: Diagnosis not present

## 2023-10-01 DIAGNOSIS — I130 Hypertensive heart and chronic kidney disease with heart failure and stage 1 through stage 4 chronic kidney disease, or unspecified chronic kidney disease: Secondary | ICD-10-CM | POA: Diagnosis not present

## 2023-10-01 DIAGNOSIS — I69392 Facial weakness following cerebral infarction: Secondary | ICD-10-CM | POA: Diagnosis not present

## 2023-10-01 DIAGNOSIS — E1122 Type 2 diabetes mellitus with diabetic chronic kidney disease: Secondary | ICD-10-CM | POA: Diagnosis not present

## 2023-10-02 DIAGNOSIS — R001 Bradycardia, unspecified: Secondary | ICD-10-CM | POA: Diagnosis not present

## 2023-10-05 ENCOUNTER — Telehealth: Payer: Self-pay | Admitting: Cardiology

## 2023-10-05 DIAGNOSIS — I10 Essential (primary) hypertension: Secondary | ICD-10-CM

## 2023-10-05 DIAGNOSIS — N184 Chronic kidney disease, stage 4 (severe): Secondary | ICD-10-CM | POA: Diagnosis not present

## 2023-10-05 DIAGNOSIS — I2510 Atherosclerotic heart disease of native coronary artery without angina pectoris: Secondary | ICD-10-CM

## 2023-10-05 DIAGNOSIS — I130 Hypertensive heart and chronic kidney disease with heart failure and stage 1 through stage 4 chronic kidney disease, or unspecified chronic kidney disease: Secondary | ICD-10-CM | POA: Diagnosis not present

## 2023-10-05 DIAGNOSIS — I69351 Hemiplegia and hemiparesis following cerebral infarction affecting right dominant side: Secondary | ICD-10-CM | POA: Diagnosis not present

## 2023-10-05 DIAGNOSIS — I5022 Chronic systolic (congestive) heart failure: Secondary | ICD-10-CM

## 2023-10-05 DIAGNOSIS — G4733 Obstructive sleep apnea (adult) (pediatric): Secondary | ICD-10-CM

## 2023-10-05 DIAGNOSIS — I5042 Chronic combined systolic (congestive) and diastolic (congestive) heart failure: Secondary | ICD-10-CM | POA: Diagnosis not present

## 2023-10-05 DIAGNOSIS — I428 Other cardiomyopathies: Secondary | ICD-10-CM

## 2023-10-05 DIAGNOSIS — E1122 Type 2 diabetes mellitus with diabetic chronic kidney disease: Secondary | ICD-10-CM | POA: Diagnosis not present

## 2023-10-05 DIAGNOSIS — I69392 Facial weakness following cerebral infarction: Secondary | ICD-10-CM | POA: Diagnosis not present

## 2023-10-05 NOTE — Telephone Encounter (Signed)
 What problem are you experiencing? Mask doesn't stay on his face at night  Who is your medical equipment company? [employer]   3)    If patient is calling about their sleep study results please route to [facility] Sleep Study Pool.   Please route to the sleep study coordinator.

## 2023-10-06 ENCOUNTER — Emergency Department (HOSPITAL_COMMUNITY)

## 2023-10-06 ENCOUNTER — Observation Stay (HOSPITAL_COMMUNITY)
Admission: EM | Admit: 2023-10-06 | Discharge: 2023-10-08 | Disposition: A | Discharge status: Home Health | Attending: Family Medicine | Admitting: Family Medicine

## 2023-10-06 ENCOUNTER — Encounter (HOSPITAL_COMMUNITY): Payer: Self-pay

## 2023-10-06 ENCOUNTER — Other Ambulatory Visit: Payer: Self-pay

## 2023-10-06 ENCOUNTER — Inpatient Hospital Stay (HOSPITAL_COMMUNITY)

## 2023-10-06 DIAGNOSIS — E1122 Type 2 diabetes mellitus with diabetic chronic kidney disease: Secondary | ICD-10-CM | POA: Diagnosis not present

## 2023-10-06 DIAGNOSIS — G4733 Obstructive sleep apnea (adult) (pediatric): Secondary | ICD-10-CM | POA: Insufficient documentation

## 2023-10-06 DIAGNOSIS — I6522 Occlusion and stenosis of left carotid artery: Secondary | ICD-10-CM | POA: Diagnosis not present

## 2023-10-06 DIAGNOSIS — I509 Heart failure, unspecified: Secondary | ICD-10-CM | POA: Diagnosis not present

## 2023-10-06 DIAGNOSIS — I5042 Chronic combined systolic (congestive) and diastolic (congestive) heart failure: Secondary | ICD-10-CM | POA: Diagnosis not present

## 2023-10-06 DIAGNOSIS — Z8546 Personal history of malignant neoplasm of prostate: Secondary | ICD-10-CM | POA: Diagnosis not present

## 2023-10-06 DIAGNOSIS — Z7902 Long term (current) use of antithrombotics/antiplatelets: Secondary | ICD-10-CM | POA: Insufficient documentation

## 2023-10-06 DIAGNOSIS — C9000 Multiple myeloma not having achieved remission: Secondary | ICD-10-CM | POA: Insufficient documentation

## 2023-10-06 DIAGNOSIS — R1312 Dysphagia, oropharyngeal phase: Secondary | ICD-10-CM | POA: Insufficient documentation

## 2023-10-06 DIAGNOSIS — I471 Supraventricular tachycardia, unspecified: Secondary | ICD-10-CM | POA: Diagnosis not present

## 2023-10-06 DIAGNOSIS — N184 Chronic kidney disease, stage 4 (severe): Secondary | ICD-10-CM | POA: Diagnosis not present

## 2023-10-06 DIAGNOSIS — I739 Peripheral vascular disease, unspecified: Secondary | ICD-10-CM | POA: Diagnosis not present

## 2023-10-06 DIAGNOSIS — I6381 Other cerebral infarction due to occlusion or stenosis of small artery: Secondary | ICD-10-CM

## 2023-10-06 DIAGNOSIS — E785 Hyperlipidemia, unspecified: Secondary | ICD-10-CM | POA: Insufficient documentation

## 2023-10-06 DIAGNOSIS — R29705 NIHSS score 5: Secondary | ICD-10-CM

## 2023-10-06 DIAGNOSIS — E1159 Type 2 diabetes mellitus with other circulatory complications: Secondary | ICD-10-CM | POA: Diagnosis not present

## 2023-10-06 DIAGNOSIS — I272 Pulmonary hypertension, unspecified: Secondary | ICD-10-CM | POA: Insufficient documentation

## 2023-10-06 DIAGNOSIS — I6389 Other cerebral infarction: Secondary | ICD-10-CM | POA: Diagnosis not present

## 2023-10-06 DIAGNOSIS — I429 Cardiomyopathy, unspecified: Secondary | ICD-10-CM | POA: Insufficient documentation

## 2023-10-06 DIAGNOSIS — I639 Cerebral infarction, unspecified: Secondary | ICD-10-CM | POA: Diagnosis not present

## 2023-10-06 DIAGNOSIS — R200 Anesthesia of skin: Secondary | ICD-10-CM | POA: Diagnosis not present

## 2023-10-06 DIAGNOSIS — I130 Hypertensive heart and chronic kidney disease with heart failure and stage 1 through stage 4 chronic kidney disease, or unspecified chronic kidney disease: Secondary | ICD-10-CM | POA: Insufficient documentation

## 2023-10-06 DIAGNOSIS — I63233 Cerebral infarction due to unspecified occlusion or stenosis of bilateral carotid arteries: Secondary | ICD-10-CM | POA: Diagnosis not present

## 2023-10-06 DIAGNOSIS — N400 Enlarged prostate without lower urinary tract symptoms: Secondary | ICD-10-CM | POA: Insufficient documentation

## 2023-10-06 DIAGNOSIS — Z6831 Body mass index (BMI) 31.0-31.9, adult: Secondary | ICD-10-CM | POA: Diagnosis not present

## 2023-10-06 DIAGNOSIS — Z79899 Other long term (current) drug therapy: Secondary | ICD-10-CM | POA: Diagnosis not present

## 2023-10-06 DIAGNOSIS — D472 Monoclonal gammopathy: Secondary | ICD-10-CM | POA: Diagnosis present

## 2023-10-06 DIAGNOSIS — R0602 Shortness of breath: Secondary | ICD-10-CM | POA: Diagnosis not present

## 2023-10-06 DIAGNOSIS — E669 Obesity, unspecified: Secondary | ICD-10-CM | POA: Insufficient documentation

## 2023-10-06 DIAGNOSIS — Z87891 Personal history of nicotine dependence: Secondary | ICD-10-CM | POA: Insufficient documentation

## 2023-10-06 DIAGNOSIS — I428 Other cardiomyopathies: Secondary | ICD-10-CM | POA: Insufficient documentation

## 2023-10-06 DIAGNOSIS — R4182 Altered mental status, unspecified: Secondary | ICD-10-CM | POA: Diagnosis not present

## 2023-10-06 DIAGNOSIS — I10 Essential (primary) hypertension: Secondary | ICD-10-CM | POA: Diagnosis not present

## 2023-10-06 DIAGNOSIS — I2510 Atherosclerotic heart disease of native coronary artery without angina pectoris: Secondary | ICD-10-CM | POA: Insufficient documentation

## 2023-10-06 DIAGNOSIS — E1142 Type 2 diabetes mellitus with diabetic polyneuropathy: Secondary | ICD-10-CM | POA: Diagnosis not present

## 2023-10-06 DIAGNOSIS — I6503 Occlusion and stenosis of bilateral vertebral arteries: Secondary | ICD-10-CM | POA: Diagnosis not present

## 2023-10-06 DIAGNOSIS — R29818 Other symptoms and signs involving the nervous system: Secondary | ICD-10-CM | POA: Diagnosis not present

## 2023-10-06 DIAGNOSIS — R202 Paresthesia of skin: Secondary | ICD-10-CM | POA: Diagnosis not present

## 2023-10-06 LAB — COMPREHENSIVE METABOLIC PANEL WITH GFR
ALT: 19 U/L (ref 0–44)
AST: 20 U/L (ref 15–41)
Albumin: 3 g/dL — ABNORMAL LOW (ref 3.5–5.0)
Alkaline Phosphatase: 70 U/L (ref 38–126)
Anion gap: 10 (ref 5–15)
BUN: 21 mg/dL (ref 8–23)
CO2: 30 mmol/L (ref 22–32)
Calcium: 9.9 mg/dL (ref 8.9–10.3)
Chloride: 101 mmol/L (ref 98–111)
Creatinine, Ser: 1.86 mg/dL — ABNORMAL HIGH (ref 0.61–1.24)
GFR, Estimated: 35 mL/min — ABNORMAL LOW (ref >60)
Glucose, Bld: 92 mg/dL (ref 70–99)
Potassium: 3.4 mmol/L — ABNORMAL LOW (ref 3.5–5.1)
Sodium: 141 mmol/L (ref 135–145)
Total Bilirubin: 0.6 mg/dL (ref 0.0–1.2)
Total Protein: 6.9 g/dL (ref 6.5–8.1)

## 2023-10-06 LAB — CBC WITH DIFFERENTIAL/PLATELET
Abs Immature Granulocytes: 0.02 10*3/uL (ref 0.00–0.07)
Basophils Absolute: 0 10*3/uL (ref 0.0–0.1)
Basophils Relative: 0 %
Eosinophils Absolute: 0.1 10*3/uL (ref 0.0–0.5)
Eosinophils Relative: 3 %
HCT: 33.7 % — ABNORMAL LOW (ref 39.0–52.0)
Hemoglobin: 10.8 g/dL — ABNORMAL LOW (ref 13.0–17.0)
Immature Granulocytes: 1 %
Lymphocytes Relative: 19 %
Lymphs Abs: 0.8 10*3/uL (ref 0.7–4.0)
MCH: 32.4 pg (ref 26.0–34.0)
MCHC: 32 g/dL (ref 30.0–36.0)
MCV: 101.2 fL — ABNORMAL HIGH (ref 80.0–100.0)
Monocytes Absolute: 0.5 10*3/uL (ref 0.1–1.0)
Monocytes Relative: 11 %
Neutro Abs: 2.8 10*3/uL (ref 1.7–7.7)
Neutrophils Relative %: 66 %
Platelets: 128 10*3/uL — ABNORMAL LOW (ref 150–400)
RBC: 3.33 MIL/uL — ABNORMAL LOW (ref 4.22–5.81)
RDW: 13.5 % (ref 11.5–15.5)
WBC: 4.2 10*3/uL (ref 4.0–10.5)
nRBC: 0 % (ref 0.0–0.2)

## 2023-10-06 LAB — URINALYSIS, ROUTINE W REFLEX MICROSCOPIC
Bilirubin Urine: NEGATIVE
Glucose, UA: NEGATIVE mg/dL
Hgb urine dipstick: NEGATIVE
Ketones, ur: NEGATIVE mg/dL
Nitrite: POSITIVE — AB
Protein, ur: 100 mg/dL — AB
Specific Gravity, Urine: 1.011 (ref 1.005–1.030)
WBC, UA: >50 WBC/hpf (ref 0–5)
pH: 7 (ref 5.0–8.0)

## 2023-10-06 LAB — TROPONIN I (HIGH SENSITIVITY): Troponin I (High Sensitivity): 34 ng/L — ABNORMAL HIGH (ref <18)

## 2023-10-06 MED ORDER — ACETAMINOPHEN 650 MG RE SUPP: 650.0000 mg | RECTAL | Status: DC | PRN

## 2023-10-06 MED ORDER — POTASSIUM CHLORIDE 10 MEQ/100ML IV SOLN
10.0000 meq | INTRAVENOUS | Status: AC
  Administered 2023-10-06: 10 meq via INTRAVENOUS
  Filled 2023-10-06 (×2): qty 100

## 2023-10-06 MED ORDER — STROKE: EARLY STAGES OF RECOVERY BOOK
Freq: Once | Status: AC
  Filled 2023-10-06: qty 1

## 2023-10-06 MED ORDER — STROKE: EARLY STAGES OF RECOVERY BOOK: Freq: Once | Status: AC

## 2023-10-06 MED ORDER — HEPARIN SODIUM (PORCINE) 5000 UNIT/ML IJ SOLN
5000.0000 [IU] | Freq: Three times a day (TID) | INTRAMUSCULAR | Status: DC
  Administered 2023-10-07 – 2023-10-08 (×5): 5000 [IU] via SUBCUTANEOUS
  Filled 2023-10-06 (×6): qty 1

## 2023-10-06 MED ORDER — ACETAMINOPHEN 325 MG PO TABS: 650.0000 mg | ORAL_TABLET | ORAL | Status: DC | PRN

## 2023-10-06 MED ORDER — ACETAMINOPHEN 160 MG/5ML PO SOLN: 650.0000 mg | ORAL | Status: DC | PRN

## 2023-10-06 MED ORDER — SENNOSIDES-DOCUSATE SODIUM 8.6-50 MG PO TABS: 1.0000 | ORAL_TABLET | Freq: Every evening | ORAL | Status: DC | PRN

## 2023-10-06 MED ORDER — ASPIRIN 81 MG PO CHEW
81.0000 mg | CHEWABLE_TABLET | Freq: Every day | ORAL | Status: DC
  Administered 2023-10-07 – 2023-10-08 (×2): 81 mg via ORAL
  Filled 2023-10-06 (×3): qty 1

## 2023-10-06 MED ORDER — CLOPIDOGREL BISULFATE 75 MG PO TABS
75.0000 mg | ORAL_TABLET | Freq: Every day | ORAL | Status: DC
  Administered 2023-10-06 – 2023-10-08 (×3): 75 mg via ORAL
  Filled 2023-10-06 (×3): qty 1

## 2023-10-06 MED ORDER — LACTATED RINGERS IV SOLN: INTRAVENOUS | Status: AC

## 2023-10-06 NOTE — Plan of Care (Signed)
  Problem: Education: Goal: Knowledge of disease or condition will improve Outcome: Progressing   Problem: Education: Goal: Knowledge of secondary prevention will improve (MUST DOCUMENT ALL) Outcome: Progressing   Problem: Education: Goal: Knowledge of patient specific risk factors will improve (DELETE if not current risk factor) Outcome: Progressing   Problem: Ischemic Stroke/TIA Tissue Perfusion: Goal: Complications of ischemic stroke/TIA will be minimized Outcome: Progressing   Problem: Coping: Goal: Will verbalize positive feelings about self Outcome: Progressing   Problem: Health Behavior/Discharge Planning: Goal: Goals will be collaboratively established with patient/family Outcome: Progressing   Problem: Self-Care: Goal: Ability to participate in self-care as condition permits will improve Outcome: Progressing   Problem: Self-Care: Goal: Verbalization of feelings and concerns over difficulty with self-care will improve Outcome: Progressing   Problem: Self-Care: Goal: Ability to communicate needs accurately will improve Outcome: Progressing

## 2023-10-06 NOTE — ED Triage Notes (Signed)
 Pt arrived from home via [facility] c/o numbness around the lips and left hand, LKW 2300 10/05/2023. Pt hx CVA with right sided deficits. Speech at baseline since previous stroke noted to have delayed responses.

## 2023-10-06 NOTE — ED Provider Notes (Signed)
  Physical Exam  BP (!) 150/76 (BP Location: Right Arm)   Pulse 67   Temp 98.1 F (36.7 C) (Oral)   Resp 18   Ht 5' 7 (1.702 m)   Wt 91.2 kg   SpO2 100%   BMI 31.49 kg/m   Physical Exam  Procedures  Procedures  ED Course / MDM    Medical Decision Making Amount and/or Complexity of Data Reviewed Labs: ordered. Radiology: ordered.  Risk Decision regarding hospitalization.   88yo male with history of CVA in May, presenting with perioral and right face paresthesias.  Received care of pt from Dr. Luberta Ruse. Please see his note for prior hx, physical and care.   MRI completed shows acute infarct right pons.  Neurology consulted and admitted to hospitalist.        Scarlette Currier, MD 10/07/23 2242

## 2023-10-06 NOTE — ED Notes (Signed)
 Pt transported to MRI

## 2023-10-06 NOTE — Consult Note (Signed)
 NEUROLOGY CONSULT NOTE   Date of service: October 06, 2023 Patient Name: Allen Dyer MRN:  [id number] DOB:  [birth year] Chief Complaint: Right facial numbness and difficulty breathing Requesting Provider: Epifanio Haste, MD  History of Present Illness  Allen Dyer is a 88 y.o. male with hx of CHF, hypertension, hyperlipidemia, multiple strokes, last 5/25, GERD, prostate cancer, gout, multiple myeloma, CAD, diabetes, peripheral neuropathy and CKD stage IV who presents with difficulty breathing and right facial numbness as well as difficulty ambulating.  Patient is unclear when his symptoms started and states that he has facial numbness and difficulty walking that have been present for about 2 months.  He is aware that some of his deficits are due to previous strokes.  He was recently discharged from [facility] after a right cerebellar stroke.  LKW: Unclear Modified rankin score: 4-Needs assistance to walk and tend to bodily needs IV Thrombolysis: No, outside of window EVT: No, outside of window  NIHSS components Score: Comment  1a Level of Conscious 0[x]  1[]  2[]  3[]      1b LOC Questions 0[x]  1[]  2[]       1c LOC Commands 0[x]  1[]  2[]       2 Best Gaze 0[x]  1[]  2[]       3 Visual 0[x]  1[]  2[]  3[]      4 Facial Palsy 0[]  1[x]  2[]  3[]      5a Motor Arm - left 0[x]  1[]  2[]  3[]  4[]  UN[]    5b Motor Arm - Right 0[x]  1[]  2[]  3[]  4[]  UN[]    6a Motor Leg - Left 0[x]  1[]  2[]  3[]  4[]  UN[]    6b Motor Leg - Right 0[x]  1[]  2[]  3[]  4[]  UN[]    7 Limb Ataxia 0[]  1[]  2[x]  UN[]      8 Sensory 0[]  1[x]  2[]  UN[]      9 Best Language 0[x]  1[]  2[]  3[]      10 Dysarthria 0[]  1[x]  2[]  UN[]      11 Extinct. and Inattention 0[x]  1[]  2[]       TOTAL:5       ROS  Comprehensive ROS performed and pertinent positives documented in HPI  Past History   Past Medical History:  Diagnosis Date   Chronic combined systolic and diastolic CHF (congestive heart failure) (HCC)    CKD (chronic kidney disease), stage III (HCC)     Hyperlipidemia    Hypertension    Lower extremity edema    Mild CAD    a. mild-mod by cath 05/2018.   Multiple myeloma (HCC) 11/15/2018   NICM (nonischemic cardiomyopathy) (HCC)    Peripheral neuropathy    Prostate cancer (HCC)    Status post XRT   Rheumatic fever    Stroke [facility])    Trifascicular block    Type 2 diabetes mellitus (HCC)    Vertigo     Past Surgical History:  Procedure Laterality Date   RIGHT HEART CATH N/A 07/10/2018   Procedure: RIGHT HEART CATH;  Surgeon: Darlis Eisenmenger, MD;  Location: [facility] INVASIVE CV LAB;  Service: Cardiovascular;  Laterality: N/A;   RIGHT/LEFT HEART CATH AND CORONARY ANGIOGRAPHY N/A 06/18/2018   Procedure: RIGHT/LEFT HEART CATH AND CORONARY ANGIOGRAPHY;  Surgeon: Millicent Ally, MD;  Location: [facility] INVASIVE CV LAB;  Service: Cardiovascular;  Laterality: N/A;    Family History: Family History  Problem Relation Age of Onset   Cancer Mother    Congestive Heart Failure Father        died from heart failure at the age of 41   Cancer Sister  Congestive Heart Failure Brother        died from heart failure at the age of 44   Stroke Neg Hx     Social History  reports that he quit smoking about 52 years ago. His smoking use included cigarettes. He started smoking about 62 years ago. He has a 20 pack-year smoking history. He has never used smokeless tobacco. He reports that he does not currently use alcohol. He reports that he does not use drugs.  Allergies  Allergen Reactions   Tramadol Other (See Comments)    Dizziness (intolerance) hallucinate   Finasteride Swelling    Breast enlargement    Jardiance  [Empagliflozin ]     Yeast infection   Lisinopril Cough   Ciprofloxacin Other (See Comments)    Dizziness (intolerance)    Simvastatin Hives and Other (See Comments)    Blisters/ Rash    Sulfa Antibiotics Rash    Medications  No current facility-administered medications for this encounter.  Current Outpatient Medications:     acetaminophen  (TYLENOL ) 325 MG tablet, Take 2 tablets (650 mg total) by mouth every 4 (four) hours as needed for mild pain (pain score 1-3) (or temp > 37.5 C (99.5 F))., Disp: , Rfl:    Ascorbic Acid (VITAMIN C) 1000 MG tablet, Take 4,000 mg by mouth every morning., Disp: , Rfl:    aspirin  EC 81 MG tablet, Take 1 tablet (81 mg total) by mouth daily. Swallow whole., Disp: 30 tablet, Rfl: 2   Blood Glucose Monitoring Suppl (FREESTYLE LITE) w/Device KIT, 1 Device by Does not apply route daily in the afternoon., Disp: 1 kit, Rfl: 0   carvedilol  (COREG ) 3.125 MG tablet, Take 1 tablet (3.125 mg total) by mouth 2 (two) times daily with a meal., Disp: 60 tablet, Rfl: 0   clopidogrel  (PLAVIX ) 75 MG tablet, Take 1 tablet (75 mg total) by mouth daily., Disp: 30 tablet, Rfl: 0   cyanocobalamin  1000 MCG tablet, Take 1 tablet (1,000 mcg total) by mouth every morning., Disp: 30 tablet, Rfl: 0   diclofenac  Sodium (VOLTAREN ) 1 % GEL, Apply 2 g topically 4 (four) times daily., Disp: 100 g, Rfl: 0   docusate sodium  (COLACE) 100 MG capsule, Take 1 capsule (100 mg total) by mouth daily., Disp: , Rfl:    gabapentin  (NEURONTIN ) 100 MG capsule, Take 1 capsule (100 mg total) by mouth at bedtime., Disp: 30 capsule, Rfl: 0   Garlic 1000 MG CAPS, Take 1,000 mg by mouth every morning., Disp: , Rfl:    glipiZIDE  (GLUCOTROL ) 5 MG tablet, Take 1 tablet (5 mg total) by mouth 2 (two) times daily before a meal., Disp: 60 tablet, Rfl: 0   glucose blood (FREESTYLE LITE) test strip, 1 each by Other route daily in the afternoon. Use as instructed, Disp: 100 each, Rfl: 3   isosorbide  mononitrate (IMDUR ) 30 MG 24 hr tablet, Take 0.5 tablets (15 mg total) by mouth every morning., Disp: 30 tablet, Rfl: 0   meclizine  (ANTIVERT ) 12.5 MG tablet, Take 1 tablet (12.5 mg total) by mouth daily as needed for dizziness., Disp: 30 tablet, Rfl: 0   modafinil  (PROVIGIL ) 100 MG tablet, Take 1 tablet (100 mg total) by mouth daily., Disp: 30 tablet, Rfl: 0    Omega-3 Fatty Acids (FISH OIL) 1200 MG CAPS, Take 1,200 mg by mouth daily., Disp: , Rfl:    Oyster Shell Calcium  500 MG TABS, Take 500 mg by mouth daily., Disp: , Rfl:    potassium chloride  SA (KLOR-CON  M) 20  MEQ tablet, Take 1 tablet (20 mEq total) by mouth daily., Disp: 30 tablet, Rfl: 0   propranolol  (INDERAL ) 10 MG tablet, Take 1 tablet (10 mg total) by mouth daily., Disp: 30 tablet, Rfl: 0   scopolamine  (TRANSDERM-SCOP) 1 MG/3DAYS, Place 1 patch (1.5 mg total) onto the skin every 3 (three) days., Disp: 10 patch, Rfl: 0   tamsulosin  (FLOMAX ) 0.4 MG CAPS capsule, Take 1 capsule (0.4 mg total) by mouth 2 (two) times daily., Disp: 60 capsule, Rfl: 0   torsemide  (DEMADEX ) 10 MG tablet, Take 3 tablets (30 mg total) by mouth daily., Disp: 90 tablet, Rfl: 0  Vitals   Vitals:   19-Oct-2023 1139 Oct 19, 2023 1145 Oct 19, 2023 1200 10/19/23 1214  BP: (!) 160/88 (!) 153/83 (!) 158/89 (!) 143/83  Pulse: (!) 58 74 (!) 59 62  Resp: 17 20 (!) 22 (!) 25  Temp: 98.6 F (37 C)   98 F (36.7 C)  TempSrc: Oral   Oral  SpO2: 98% 97% 95% 99%  Weight:      Height:        Body mass index is 31.49 kg/m.   Physical Exam   Constitutional: Appears well-developed and well-nourished.  Psych: Affect appropriate to situation.  Eyes: No scleral injection.  HENT: No OP obstruction.  Head: Normocephalic.  Cardiovascular: Normal rate and regular rhythm.  Respiratory: Effort normal, non-labored breathing.  Skin: WDI.   Neurologic Examination    NEURO:  Mental Status: AA&Ox3, poor historian unable to give good record of his symptoms.  Able to follow single step and two-step commands without difficulty Speech/Language: speech is with moderate dysarthria but without aphasia  Cranial Nerves:  II: PERRL. Visual fields full.  III, IV, VI: EOMI. slight left ptosis V: Sensation is intact to light touch and diminished on the right VII: Left facial droop VIII: hearing intact to voice. IX, X: Voice is  dysarthric [id number] shrug 5/5. XII: tongue is midline without fasciculations. Motor: Able to move all 4 extremities with good antigravity strength Tone: is normal and bulk is normal Sensation- Intact to light touch bilaterally. Extinction absent to light touch to DSS.   Coordination: FTN with mild bilateral ataxia Gait- deferred   Labs/Imaging/Neurodiagnostic studies   CBC:  Recent Labs  Lab 10-19-23 0612  WBC 4.2  NEUTROABS 2.8  HGB 10.8*  HCT 33.7*  MCV 101.2*  PLT 128*   Basic Metabolic Panel:  Lab Results  Component Value Date   NA 141 October 19, 2023   K 3.4 (L) 19-Oct-2023   CO2 30 2023/10/19   GLUCOSE 92 10/19/23   BUN 21 19-Oct-2023   CREATININE 1.86 (H) 10/19/2023   CALCIUM  9.9 10-19-2023   GFRNONAA 35 (L) 10/19/23   GFRAA 26 (L) 12/02/2019   Lipid Panel:  Lab Results  Component Value Date   LDLCALC 57 09/03/2023   HgbA1c:  Lab Results  Component Value Date   HGBA1C 7.0 (H) 09/03/2023   Urine Drug Screen:     Component Value Date/Time   LABOPIA NONE DETECTED 09/11/2021 1225   COCAINSCRNUR NONE DETECTED 09/11/2021 1225   LABBENZ NONE DETECTED 09/11/2021 1225   AMPHETMU NONE DETECTED 09/11/2021 1225   THCU NONE DETECTED 09/11/2021 1225   LABBARB NONE DETECTED 09/11/2021 1225    Alcohol Level No results found for: [facility] INR  Lab Results  Component Value Date   INR 1.0 09/11/2021   APTT  Lab Results  Component Value Date   APTT 29 09/11/2021    CT Head without contrast(Personally reviewed):  Evolving right thalamic territory infarct, bilateral subacute cerebellar peduncle infarcts, no acute abnormalities  MR Angio head without contrast and Carotid Duplex BL: Pending  MRI Brain(Personally reviewed): Acute infarct in the right pons, multiple chronic infarcts, atrophy and chronic microvascular ischemic changes   ASSESSMENT  Allen Dyer is a 88 y.o. male with hx of CHF, hypertension, hyperlipidemia, multiple strokes, last 5/25, GERD,  prostate cancer, gout, multiple myeloma, CAD, diabetes, peripheral neuropathy and CKD stage IV who presents with difficulty breathing, difficulty ambulating and right facial numbness.  He is unclear when his symptoms started and states that some of them have been present for about 2 months.  - MRI reveals acute right lateral pontine infarct.   - On exam, he is noted to have some dysarthria and left facial droop which could be due to the new infarct.  He reports that he has numbness on the right side of his face that is chronic and was due to a previous stroke.   - Will need to be admitted for full stroke workup and PT/OT evaluation.   - Given multiple strokes and history of multifocal intracranial stenosis, may need prolonged DAPT as well as outpatient cardiac monitoring.   RECOMMENDATIONS   - Admit for stroke workup - Permissive HTN x48 hrs goal BP <220/110. PRN labetalol or hydralazine  if BP above these parameters. Avoid oral antihypertensives. - MRA head and carotid ultrasound - Echocardiogram not needed as one was performed last month - Check A1c and LDL + add statin per guidelines - Baseline CK level - Aspirin  81 mg daily and Plavix  75 mg daily antiplt/anticoag - q4 hr neuro checks - STAT head CT for any change in neuro exam - Tele - PT/OT/SLP - Stroke education - Amb referral to neurology upon discharge   - Stroke Team to follow ______________________________________________________________________  Patient seen by NP and MD.  Signed, Cortney E Bucky Cardinal, NP [facility]   I have seen and examined the patient. I have formulated the assessment and recommendations. 88 y.o. male with hx of CHF, hypertension, hyperlipidemia, multiple strokes, last 5/25, GERD, prostate cancer, gout, multiple myeloma, CAD, diabetes, peripheral neuropathy and CKD stage IV who presents with difficulty breathing, difficulty ambulating and right facial numbness.  He is unclear when his symptoms  started and states that some of them have been present for about 2 months. MRI reveals acute right lateral pontine infarct. On exam, he is noted to have some dysarthria and left facial droop which could be due to the new infarct. Recommendations as above.    Electronically signed: Dr. Valeria Krisko

## 2023-10-06 NOTE — ED Provider Notes (Signed)
 [place] EMERGENCY DEPARTMENT AT [facility] Provider Note   CSN: [id number] Arrival date & time: 10/06/23  8295     Patient presents with: Numbness   Allen Dyer is a 88 y.o. male.  {Add pertinent medical, surgical, social history, OB history to HPI:2732} 88 year old male who presents ER today with perioral paresthesias and right sided face paresthesias.  Initially stated that he had right arm weakness to somebody however he states that his sensation and motor in both of his arms and legs are similar to what they have been since the stroke last month.  States when he is new is the paresthesias around his lips and his right side of his face.  No other recent illnesses.  Difficulty speaking is at his baseline as well.  No other recent illnesses.        Prior to Admission medications   Medication Sig Start Date End Date Taking? Authorizing Provider  acetaminophen  (TYLENOL ) 325 MG tablet Take 2 tablets (650 mg total) by mouth every 4 (four) hours as needed for mild pain (pain score 1-3) (or temp > 37.5 C (99.5 F)). 09/05/23   Angiulli, Everlyn Hockey, PA-C  Ascorbic Acid (VITAMIN C) 1000 MG tablet Take 4,000 mg by mouth every morning.    [provider]  aspirin  EC 81 MG tablet Take 1 tablet (81 mg total) by mouth daily. Swallow whole. 09/06/23 12/05/23  Akula, Vijaya, MD  Blood Glucose Monitoring Suppl (FREESTYLE LITE) w/Device KIT 1 Device by Does not apply route daily in the afternoon. 05/24/21   Shamleffer, Ibtehal Jaralla, MD  carvedilol  (COREG ) 3.125 MG tablet Take 1 tablet (3.125 mg total) by mouth 2 (two) times daily with a meal. 09/26/23   Angiulli, Everlyn Hockey, PA-C  clopidogrel  (PLAVIX ) 75 MG tablet Take 1 tablet (75 mg total) by mouth daily. 09/26/23   Angiulli, Everlyn Hockey, PA-C  cyanocobalamin  1000 MCG tablet Take 1 tablet (1,000 mcg total) by mouth every morning. 09/26/23   Angiulli, Everlyn Hockey, PA-C  diclofenac  Sodium (VOLTAREN ) 1 % GEL Apply 2 g topically 4 (four) times  daily. 09/26/23   Angiulli, Everlyn Hockey, PA-C  docusate sodium  (COLACE) 100 MG capsule Take 1 capsule (100 mg total) by mouth daily. 09/25/23   Angiulli, Everlyn Hockey, PA-C  gabapentin  (NEURONTIN ) 100 MG capsule Take 1 capsule (100 mg total) by mouth at bedtime. 09/26/23   Angiulli, Everlyn Hockey, PA-C  Garlic 1000 MG CAPS Take 1,000 mg by mouth every morning.    [provider]  glipiZIDE  (GLUCOTROL ) 5 MG tablet Take 1 tablet (5 mg total) by mouth 2 (two) times daily before a meal. 09/26/23   Angiulli, Everlyn Hockey, PA-C  glucose blood (FREESTYLE LITE) test strip 1 each by Other route daily in the afternoon. Use as instructed 05/24/21   Shamleffer, Ibtehal Jaralla, MD  isosorbide  mononitrate (IMDUR ) 30 MG 24 hr tablet Take 0.5 tablets (15 mg total) by mouth every morning. 09/26/23   Angiulli, Everlyn Hockey, PA-C  meclizine  (ANTIVERT ) 12.5 MG tablet Take 1 tablet (12.5 mg total) by mouth daily as needed for dizziness. 09/26/23   Angiulli, Everlyn Hockey, PA-C  modafinil  (PROVIGIL ) 100 MG tablet Take 1 tablet (100 mg total) by mouth daily. 09/26/23   Angiulli, Everlyn Hockey, PA-C  Omega-3 Fatty Acids (FISH OIL) 1200 MG CAPS Take 1,200 mg by mouth daily.    [provider]  Glynda Lash Calcium  500 MG TABS Take 500 mg by mouth daily.    [provider]  potassium chloride  SA (KLOR-CON  M) 20 MEQ tablet Take 1 tablet (20 mEq total) by mouth daily. 09/26/23   Angiulli, Everlyn Hockey, PA-C  propranolol  (INDERAL ) 10 MG tablet Take 1 tablet (10 mg total) by mouth daily. 09/26/23   Angiulli, Everlyn Hockey, PA-C  scopolamine  (TRANSDERM-SCOP) 1 MG/3DAYS Place 1 patch (1.5 mg total) onto the skin every 3 (three) days. 09/28/23   Angiulli, Everlyn Hockey, PA-C  tamsulosin  (FLOMAX ) 0.4 MG CAPS capsule Take 1 capsule (0.4 mg total) by mouth 2 (two) times daily. 09/26/23   Angiulli, Everlyn Hockey, PA-C  torsemide  (DEMADEX ) 10 MG tablet Take 3 tablets (30 mg total) by mouth daily. 09/26/23   Angiulli, Everlyn Hockey, PA-C    Allergies: Tramadol, Finasteride, Jardiance   [empagliflozin ], Lisinopril, Ciprofloxacin, Simvastatin, and Sulfa antibiotics    Review of Systems  Updated Vital Signs BP (!) 150/76 (BP Location: Right Arm)   Pulse 67   Temp 98.1 F (36.7 C) (Oral)   Resp 18   Ht 5' 7 (1.702 m)   Wt 91.2 kg   SpO2 100%   BMI 31.49 kg/m   Physical Exam Vitals and nursing note reviewed.  Constitutional:      Appearance: He is well-developed.  HENT:     Head: Normocephalic and atraumatic.   Cardiovascular:     Rate and Rhythm: Normal rate.  Pulmonary:     Effort: Pulmonary effort is normal. No respiratory distress.  Abdominal:     General: There is no distension.   Musculoskeletal:        General: Normal range of motion.     Cervical back: Normal range of motion.   Neurological:     Mental Status: He is alert.     Comments: Difficulty fully assess but reportedly at baseline.  Patient has a splint on his right wrist secondary to a fracture however is able to do his arms at his reported baseline strength.  Can smile with a mild facial droop.  Can feel me touching his distal extremities in all 3 segments of his face although it feels different than his left side.  Seems that his eyelids are pretty similar in strength.  No obvious forehead differences.    (all labs ordered are listed, but only abnormal results are displayed) Labs Reviewed  CBC WITH DIFFERENTIAL/PLATELET  COMPREHENSIVE METABOLIC PANEL WITH GFR  URINALYSIS, ROUTINE W REFLEX MICROSCOPIC  TROPONIN I (HIGH SENSITIVITY)    EKG: None  Radiology: CT Head Wo Contrast Result Date: 10/06/2023 EXAM: CT HEAD WITHOUT CONTRAST 10/06/2023 06:40:44 AM TECHNIQUE: CT of the head was performed without the administration of intravenous contrast. Automated exposure control, iterative reconstruction, and/or weight based adjustment of the mA/kV was utilized to reduce the radiation dose to as low as reasonably achievable. COMPARISON: CT head without contrast 09/19/2023. MR head without  contrast 09/11/2023. CLINICAL HISTORY: Numbness around the lips and left hand. Last known well at 11:00 pm last night. FINDINGS: BRAIN AND VENTRICLES: Evolving right PICA territory nonhemorrhagic infarct is noted. Bilateral subacute cerebellar peduncle infarcts are again noted. No acute hemorrhage is present. More remote lacunar infarcts are present in the cerebellum bilaterally. No acute supratentorial infarcts are present. ORBITS: Bilateral lens replacements are noted. The globes and orbits are otherwise within normal limits. SINUSES: The visualized paranasal sinuses and mastoid air cells demonstrate no acute abnormality. SOFT TISSUES AND SKULL: No acute abnormality of the visualized skull or soft tissues. VASCULATURE: Atherosclerotic calcifications are present in the cavernous carotid arteries bilaterally and at the dural margin  of both vertebral arteries. No hyperdense vessel is present. IMPRESSION: 1. Evolving right thalamic territory nonhemorrhagic infarct. 2. Bilateral subacute cerebellar peduncle infarcts. 3. No acute hemorrhage. No acute supratentorial infarct. Electronically signed by: Audree Leas MD 10/06/2023 06:52 AM EDT RP Workstation: [id number]   DG Chest Portable 1 View Result Date: 10/06/2023 CLINICAL DATA:  Mental status changes. EXAM: PORTABLE CHEST 1 VIEW COMPARISON:  09/23/2021 FINDINGS: The lungs are clear without focal pneumonia, edema, pneumothorax or pleural effusion. The cardio pericardial silhouette is enlarged. No acute bony abnormality. Telemetry leads overlie the chest. IMPRESSION: Enlargement of the cardiopericardial silhouette without acute cardiopulmonary findings. Electronically Signed   By: Donnal Fusi M.D.   On: 10/06/2023 06:33    {Document cardiac monitor, telemetry assessment procedure when appropriate:32947} Procedures   Medications Ordered in the ED - No data to display    {Click here for ABCD2, HEART and other calculators REFRESH Note before signing:1}                               Medical Decision Making Amount and/or Complexity of Data Reviewed Labs: ordered. Radiology: ordered.   Does not really seem like a stroke however recently did have stroke.  Will get CT and basic labs.  Consider MRI if CT is unrevealing and no electrolyte abnromalities. Less likely bells.  {Document critical care time when appropriate  Document review of labs and clinical decision tools ie CHADS2VASC2, etc  Document your independent review of radiology images and any outside records  Document your discussion with family members, caretakers and with consultants  Document social determinants of health affecting pt's care  Document your decision making why or why not admission, treatments were needed:32947:::1}   Final diagnoses:  None    ED Discharge Orders     None

## 2023-10-06 NOTE — ED Notes (Addendum)
 Pt AO x4, no actue distress. Pt states that he is here d/t facial numbness x 2 months. Slow speech due to previous stroke. Denies any headache, dizziness, shob, or chest pain.

## 2023-10-06 NOTE — H&P (Addendum)
 [facility] H&P   Patient Demographics:    Allen Dyer, is a 88 y.o. male  MRN: [id number]   DOB - Dec 23, 1935  Admit Date - 10/06/2023  Outpatient Primary MD for the patient is Candance Certain, Dibas, MD  Referring MD/NP/PA: Dr Tamela Fake  Patient coming from: home  Chief Complaint  Patient presents with   Numbness      HPI:    Allen Dyer  is a 88 y.o. male,  with history of chronic combined heart failure, nonischemic cardiomyopathy known ejection fraction 35%, stroke, prostate cancer status postradiation therapy and BPH, GERD, multiple myeloma, nonobstructive coronary artery disease, hypertension hyperlipidemia and type 2 diabetes, CKD stage IV, patient with recent hospitalization secondary to acute CVA, discharged to [facility], and he was discharged home from [facility] 09/26/2023, patient presents to ED secondary to complaints of facial tingling, numbness, prompted him to come to ED, he is not very clear about start time of his symptoms.  Jent reports he has been compliant with his medications. - In ED labs significant for potassium 3.4, magnesium 1.8, better than baseline, platelet of 128, MRI Allen significant for acute right pons nonhemorrhagic infarct, [facility] consulted to admit.   Review of systems:      A full 10 point Review of Systems was done, except as stated above, all other Review of Systems were negative.   With Past History of the following :    Past Medical History:  Diagnosis Date   Chronic combined systolic and diastolic CHF (congestive heart failure) (HCC)    CKD (chronic kidney disease), stage III (HCC)    Hyperlipidemia    Hypertension    Lower extremity edema    Mild CAD    a. mild-mod by cath 05/2018.   Multiple myeloma (HCC) 11/15/2018   NICM (nonischemic cardiomyopathy) (HCC)    Peripheral neuropathy    Prostate cancer (HCC)    Status post XRT   Rheumatic  fever    Stroke [facility])    Trifascicular block    Type 2 diabetes mellitus (HCC)    Vertigo       Past Surgical History:  Procedure Laterality Date   RIGHT HEART CATH N/A 07/10/2018   Procedure: RIGHT HEART CATH;  Surgeon: Darlis Eisenmenger, MD;  Location: [facility] INVASIVE CV LAB;  Service: Cardiovascular;  Laterality: N/A;   RIGHT/LEFT HEART CATH AND CORONARY ANGIOGRAPHY N/A 06/18/2018   Procedure: RIGHT/LEFT HEART CATH AND CORONARY ANGIOGRAPHY;  Surgeon: Millicent Ally, MD;  Location: [facility] INVASIVE CV LAB;  Service: Cardiovascular;  Laterality: N/A;      Social History:     Social History   Tobacco Use   Smoking status: Former    Current packs/day: 0.00    Average packs/day: 2.0 packs/day for 10.0 years (20.0 ttl pk-yrs)    Types: Cigarettes    Start date: 1963    Quit date: 1973  Years since quitting: 52.4   Smokeless tobacco: Never  Substance Use Topics   Alcohol use: Not Currently    Comment: quit in 1975, no h/o heavy use        Family History :     Family History  Problem Relation Age of Onset   Cancer Mother    Congestive Heart Failure Father        died from heart failure at the age of 53   Cancer Sister    Congestive Heart Failure Brother        died from heart failure at the age of 26   Stroke Neg Hx       Home Medications:   Prior to Admission medications   Medication Sig Start Date End Date Taking? Authorizing Provider  acetaminophen  (TYLENOL ) 325 MG tablet Take 2 tablets (650 mg total) by mouth every 4 (four) hours as needed for mild pain (pain score 1-3) (or temp > 37.5 C (99.5 F)). 09/05/23   Angiulli, Everlyn Hockey, PA-C  Ascorbic Acid (VITAMIN C) 1000 MG tablet Take 4,000 mg by mouth every morning.    [provider]  aspirin  EC 81 MG tablet Take 1 tablet (81 mg total) by mouth daily. Swallow whole. 09/06/23 12/05/23  Akula, Vijaya, MD  Blood Glucose Monitoring Suppl (FREESTYLE LITE) w/Device KIT 1 Device by Does not apply route daily in the  afternoon. 05/24/21   Shamleffer, Ibtehal Jaralla, MD  carvedilol  (COREG ) 3.125 MG tablet Take 1 tablet (3.125 mg total) by mouth 2 (two) times daily with a meal. 09/26/23   Angiulli, Everlyn Hockey, PA-C  clopidogrel  (PLAVIX ) 75 MG tablet Take 1 tablet (75 mg total) by mouth daily. 09/26/23   Angiulli, Everlyn Hockey, PA-C  cyanocobalamin  1000 MCG tablet Take 1 tablet (1,000 mcg total) by mouth every morning. 09/26/23   Angiulli, Everlyn Hockey, PA-C  diclofenac  Sodium (VOLTAREN ) 1 % GEL Apply 2 g topically 4 (four) times daily. 09/26/23   Angiulli, Everlyn Hockey, PA-C  docusate sodium  (COLACE) 100 MG capsule Take 1 capsule (100 mg total) by mouth daily. 09/25/23   Angiulli, Everlyn Hockey, PA-C  gabapentin  (NEURONTIN ) 100 MG capsule Take 1 capsule (100 mg total) by mouth at bedtime. 09/26/23   Angiulli, Everlyn Hockey, PA-C  Garlic 1000 MG CAPS Take 1,000 mg by mouth every morning.    [provider]  glipiZIDE  (GLUCOTROL ) 5 MG tablet Take 1 tablet (5 mg total) by mouth 2 (two) times daily before a meal. 09/26/23   Angiulli, Everlyn Hockey, PA-C  glucose blood (FREESTYLE LITE) test strip 1 each by Other route daily in the afternoon. Use as instructed 05/24/21   Shamleffer, Ibtehal Jaralla, MD  isosorbide  mononitrate (IMDUR ) 30 MG 24 hr tablet Take 0.5 tablets (15 mg total) by mouth every morning. 09/26/23   Angiulli, Everlyn Hockey, PA-C  meclizine  (ANTIVERT ) 12.5 MG tablet Take 1 tablet (12.5 mg total) by mouth daily as needed for dizziness. 09/26/23   Angiulli, Everlyn Hockey, PA-C  modafinil  (PROVIGIL ) 100 MG tablet Take 1 tablet (100 mg total) by mouth daily. 09/26/23   Angiulli, Everlyn Hockey, PA-C  Omega-3 Fatty Acids (FISH OIL) 1200 MG CAPS Take 1,200 mg by mouth daily.    [provider]  Glynda Lash Calcium  500 MG TABS Take 500 mg by mouth daily.    [provider]  potassium chloride  SA (KLOR-CON  M) 20 MEQ tablet Take 1 tablet (20 mEq total) by mouth daily. 09/26/23   Angiulli, Everlyn Hockey, PA-C  propranolol  (INDERAL ) 10 MG tablet Take 1 tablet  (10 mg total) by mouth daily. 09/26/23   Angiulli, Everlyn Hockey, PA-C  scopolamine  (TRANSDERM-SCOP) 1 MG/3DAYS Place 1 patch (1.5 mg total) onto the skin every 3 (three) days. 09/28/23   Angiulli, Everlyn Hockey, PA-C  tamsulosin  (FLOMAX ) 0.4 MG CAPS capsule Take 1 capsule (0.4 mg total) by mouth 2 (two) times daily. 09/26/23   Angiulli, Everlyn Hockey, PA-C  torsemide  (DEMADEX ) 10 MG tablet Take 3 tablets (30 mg total) by mouth daily. 09/26/23   Angiulli, Everlyn Hockey, PA-C     Allergies:     Allergies  Allergen Reactions   Tramadol Other (See Comments)    Dizziness (intolerance) hallucinate   Finasteride Swelling    Breast enlargement    Jardiance  [Empagliflozin ]     Yeast infection   Lisinopril Cough   Ciprofloxacin Other (See Comments)    Dizziness (intolerance)    Simvastatin Hives and Other (See Comments)    Blisters/ Rash    Sulfa Antibiotics Rash     Physical Exam:   Vitals  Blood pressure (!) 160/88, pulse (!) 58, temperature 98.6 F (37 C), temperature source Oral, resp. rate 17, height 5' 7 (1.702 m), weight 91.2 kg, SpO2 98%.   1. General Frail, elderly male, laying in bed, no apparent distress  2.  For historian, awake, alert oriented x 3, follow commands, answering most questions appropriately, but cannot give exact details, has moderate dysarthria, reports this is residual from previous stroke .  3.  Left facial droop, left ptosis, dysarthria strength 5/5 all 4 extremities, Sensation intact all 4 extremities, Plantars down going.  4. Ears and Eyes appear Normal, Conjunctivae clear, PERRLA. Moist Oral Mucosa.  5. Supple Neck, No JVD, No cervical lymphadenopathy appriciated, No Carotid Bruits.  6. Symmetrical Chest wall movement, Good air movement bilaterally, CTAB.  7. RRR, No Gallops, Rubs or Murmurs, No Parasternal Heave.  8. Positive Bowel Sounds, Abdomen Soft, No tenderness, No organomegaly appriciated,No rebound -guarding or rigidity.  9.  No Cyanosis, Normal Skin Turgor,  No Skin Rash or Bruise.  10. Good muscle tone,  joints appear normal , no effusions, Normal ROM.    Data Review:    CBC Recent Labs  Lab 10/06/23 0612  WBC 4.2  HGB 10.8*  HCT 33.7*  PLT 128*  MCV 101.2*  MCH 32.4  MCHC 32.0  RDW 13.5  LYMPHSABS 0.8  MONOABS 0.5  EOSABS 0.1  BASOSABS 0.0   ------------------------------------------------------------------------------------------------------------------  Chemistries  Recent Labs  Lab 10/06/23 0612  NA 141  K 3.4*  CL 101  CO2 30  GLUCOSE 92  BUN 21  CREATININE 1.86*  CALCIUM  9.9  AST 20  ALT 19  ALKPHOS 70  BILITOT 0.6   ------------------------------------------------------------------------------------------------------------------ estimated creatinine clearance is 30.7 mL/min (A) (by C-G formula based on SCr of 1.86 mg/dL (H)). ------------------------------------------------------------------------------------------------------------------ No results for input(s): TSH, T4TOTAL, T3FREE, THYROIDAB in the last 72 hours.  Invalid input(s): FREET3  Coagulation profile No results for input(s): INR, PROTIME in the last 168 hours. ------------------------------------------------------------------------------------------------------------------- No results for input(s): DDIMER in the last 72 hours. -------------------------------------------------------------------------------------------------------------------  Cardiac Enzymes No results for input(s): CKMB, TROPONINI, MYOGLOBIN in the last 168 hours.  Invalid input(s): CK ------------------------------------------------------------------------------------------------------------------    Component Value Date/Time   BNP 228.7 (H) 07/30/2023 0938     ---------------------------------------------------------------------------------------------------------------  Urinalysis    Component Value Date/Time   COLORURINE YELLOW  09/02/2023 1657   APPEARANCEUR CLOUDY (A) 09/02/2023 1657   LABSPEC 1.009 09/02/2023  1657   PHURINE 6.0 09/02/2023 1657   GLUCOSEU NEGATIVE 09/02/2023 1657   HGBUR MODERATE (A) 09/02/2023 1657   BILIRUBINUR NEGATIVE 09/02/2023 1657   KETONESUR NEGATIVE 09/02/2023 1657   PROTEINUR 100 (A) 09/02/2023 1657   NITRITE POSITIVE (A) 09/02/2023 1657   LEUKOCYTESUR LARGE (A) 09/02/2023 1657    ----------------------------------------------------------------------------------------------------------------   Imaging Results:    MR Allen WO CONTRAST Result Date: 10/06/2023 EXAM: MRI Allen WITHOUT CONTRAST 10/06/2023 08:59:34 AM TECHNIQUE: Multiplanar multisequence MRI of the head/Allen was performed without the administration of intravenous contrast. COMPARISON: MR head without contrast 09/11/2023. CLINICAL HISTORY: Neuro deficit, acute, stroke suspected. FINDINGS: Allen AND VENTRICLES: An acute nonhemorrhagic infarct is present along the posterolateral aspect of the right pons. Previously seen restricted diffusion within the cerebellar peduncles bilaterally has near completely resolved. T2 changes are present. No other acute infarcts are present. Multiple remote lacunar infarcts are present in the cerebellum bilaterally. Left paramedian pontine remote infarct is again noted. Multiple punctate foci of remote hemorrhage is now present in the left cerebellar peduncle. No acute hemorrhage is present. A remote nonhemorrhagic lacunar infarct is again noted in the posterior right basal ganglia. Mild atrophy and periventricular white matter change are otherwise within normal limits for age. A remote cortical infarct is present in the right occipital pole. ORBITS: Bilateral lens replacements are noted. The globes and orbits are otherwise within normal limits. SINUSES AND MASTOIDS: A small right mastoid effusion is present. BONES AND SOFT TISSUES: Normal marrow signal. No acute soft tissue abnormality. IMPRESSION: 1.  Acute nonhemorrhagic infarct in the right posterolateral pons. No other acute infarcts. 2. Multiple remote lacunar infarcts in the cerebellum bilaterally, left paramedian pons, and posterior right basal ganglia. 3. Evolving signal changes in the cerebellar peduncles bilaterally are consistent with a now subacute ischemic event. 4. Remote cortical infarct in the right occipital pole. 5. Multiple punctate foci of remote hemorrhage in the left cerebellar peduncle. No acute hemorrhage. 6. Mild atrophy and periventricular white matter change within normal limits for age. Electronically signed by: Audree Leas MD 10/06/2023 09:18 AM EDT RP Workstation: [id number]   CT Head Wo Contrast Result Date: 10/06/2023 EXAM: CT HEAD WITHOUT CONTRAST 10/06/2023 06:40:44 AM TECHNIQUE: CT of the head was performed without the administration of intravenous contrast. Automated exposure control, iterative reconstruction, and/or weight based adjustment of the mA/kV was utilized to reduce the radiation dose to as low as reasonably achievable. COMPARISON: CT head without contrast 09/19/2023. MR head without contrast 09/11/2023. CLINICAL HISTORY: Numbness around the lips and left hand. Last known well at 11:00 pm last night. FINDINGS: Allen AND VENTRICLES: Evolving right PICA territory nonhemorrhagic infarct is noted. Bilateral subacute cerebellar peduncle infarcts are again noted. No acute hemorrhage is present. More remote lacunar infarcts are present in the cerebellum bilaterally. No acute supratentorial infarcts are present. ORBITS: Bilateral lens replacements are noted. The globes and orbits are otherwise within normal limits. SINUSES: The visualized paranasal sinuses and mastoid air cells demonstrate no acute abnormality. SOFT TISSUES AND SKULL: No acute abnormality of the visualized skull or soft tissues. VASCULATURE: Atherosclerotic calcifications are present in the cavernous carotid arteries bilaterally and at the dural  margin of both vertebral arteries. No hyperdense vessel is present. IMPRESSION: 1. Evolving right thalamic territory nonhemorrhagic infarct. 2. Bilateral subacute cerebellar peduncle infarcts. 3. No acute hemorrhage. No acute supratentorial infarct. Electronically signed by: Audree Leas MD 10/06/2023 06:52 AM EDT RP Workstation: [id number]   DG Chest Portable 1 View Result Date: 10/06/2023 CLINICAL DATA:  Mental status changes. EXAM: PORTABLE CHEST 1 VIEW COMPARISON:  09/23/2021 FINDINGS: The lungs are clear without focal pneumonia, edema, pneumothorax or pleural effusion. The cardio pericardial silhouette is enlarged. No acute bony abnormality. Telemetry leads overlie the chest. IMPRESSION: Enlargement of the cardiopericardial silhouette without acute cardiopulmonary findings. Electronically Signed   By: Donnal Fusi M.D.   On: 10/06/2023 06:33     Assessment & Plan:    Principal Problem:   Acute CVA (cerebrovascular accident) [facility]) Active Problems:   Hyperlipidemia   Essential hypertension   NICM (nonischemic cardiomyopathy) (HCC)   Pulmonary hypertension (HCC)   Type 2 diabetes mellitus with vascular disease (HCC)   Smoldering multiple myeloma   Peripheral vascular disease (HCC)   Acute CVA - Patient with recent hospitalization for acute CVA, significant for bilateral cerebellar infarct, right more than left, to be secondary to large vessel disease from severe posterior circulation stenosis. - Imaging as well this admission significant for acute right lateral pontine infarct,. - He is already on dual at least platelet therapy aspirin  and Plavix , resumed by neurology. - Patient to repeat echocardiogram as just done last month. - MRA head and carotid ultrasound. - Allow for permissive hypertension - PT/OT/SLP consulted   Diastolic CHF Cardiomyopathy -During recent hospitalization 2D echo significant for EF 25 to 30% . - Allow for permissive hypertension   Hypertension  -  Hold meds, allow for permissive hypertension x 48 hours, with goal less than 220/110   CKD stage IIIb - At baseline, avoid nephrotoxic medications  BPH - Resume Flomax  when safe to swallow  Diabetes mellitus, type II - He is on glipizide , will keep an insulin  sliding scale during hospital stay  OSA - Continue with CPAP DVT Prophylaxis Heparin    AM Labs Ordered, also please review Full Orders  Family Communication: Admission, patients condition and plan of care including tests being ordered have been discussed with the patient who indicate understanding and agree with the plan and Code Status.  Code Status DNR   Consults called: neurology    Admission status: inpatient    Time spent in minutes : 70 minutes   Seena Dadds M.D on 10/06/2023 at 12:14 PM   [facility] - Office  [phone]

## 2023-10-06 NOTE — ED Notes (Signed)
Room being cleaned

## 2023-10-06 NOTE — Progress Notes (Signed)
 Received on cart from ED.  Oriented to room and surroundings.  Friends at side.

## 2023-10-07 ENCOUNTER — Other Ambulatory Visit: Payer: Self-pay | Admitting: Physician Assistant

## 2023-10-07 ENCOUNTER — Inpatient Hospital Stay (HOSPITAL_BASED_OUTPATIENT_CLINIC_OR_DEPARTMENT_OTHER)

## 2023-10-07 DIAGNOSIS — I639 Cerebral infarction, unspecified: Secondary | ICD-10-CM

## 2023-10-07 DIAGNOSIS — I6389 Other cerebral infarction: Secondary | ICD-10-CM | POA: Diagnosis not present

## 2023-10-07 DIAGNOSIS — I63543 Cerebral infarction due to unspecified occlusion or stenosis of bilateral cerebellar arteries: Secondary | ICD-10-CM

## 2023-10-07 LAB — BASIC METABOLIC PANEL WITH GFR
Anion gap: 13 (ref 5–15)
BUN: 19 mg/dL (ref 8–23)
CO2: 26 mmol/L (ref 22–32)
Calcium: 10 mg/dL (ref 8.9–10.3)
Chloride: 103 mmol/L (ref 98–111)
Creatinine, Ser: 1.65 mg/dL — ABNORMAL HIGH (ref 0.61–1.24)
GFR, Estimated: 40 mL/min — ABNORMAL LOW (ref >60)
Glucose, Bld: 113 mg/dL — ABNORMAL HIGH (ref 70–99)
Potassium: 3.5 mmol/L (ref 3.5–5.1)
Sodium: 142 mmol/L (ref 135–145)

## 2023-10-07 LAB — LIPID PANEL
Cholesterol: 126 mg/dL (ref 0–200)
HDL: 38 mg/dL — ABNORMAL LOW (ref >40)
LDL Cholesterol: 73 mg/dL (ref 0–99)
Total CHOL/HDL Ratio: 3.3 ratio
Triglycerides: 75 mg/dL (ref <150)
VLDL: 15 mg/dL (ref 0–40)

## 2023-10-07 LAB — CBC
HCT: 35.4 % — ABNORMAL LOW (ref 39.0–52.0)
Hemoglobin: 11.5 g/dL — ABNORMAL LOW (ref 13.0–17.0)
MCH: 32.3 pg (ref 26.0–34.0)
MCHC: 32.5 g/dL (ref 30.0–36.0)
MCV: 99.4 fL (ref 80.0–100.0)
Platelets: 135 10*3/uL — ABNORMAL LOW (ref 150–400)
RBC: 3.56 MIL/uL — ABNORMAL LOW (ref 4.22–5.81)
RDW: 13.3 % (ref 11.5–15.5)
WBC: 4.9 10*3/uL (ref 4.0–10.5)
nRBC: 0 % (ref 0.0–0.2)

## 2023-10-07 LAB — HEMOGLOBIN A1C
Hgb A1c MFr Bld: 7 % — ABNORMAL HIGH (ref 4.8–5.6)
Mean Plasma Glucose: 154.2 mg/dL

## 2023-10-07 MED ORDER — ISOSORBIDE MONONITRATE ER 30 MG PO TB24: 15.0000 mg | ORAL_TABLET | Freq: Every morning | ORAL | Status: DC

## 2023-10-07 MED ORDER — ROSUVASTATIN CALCIUM 10 MG PO TABS: 10.0000 mg | ORAL_TABLET | Freq: Every day | ORAL | 1 refills | Status: DC

## 2023-10-07 MED ORDER — CARVEDILOL 3.125 MG PO TABS
3.1250 mg | ORAL_TABLET | Freq: Two times a day (BID) | ORAL | Status: DC
  Administered 2023-10-08 (×2): 3.125 mg via ORAL
  Filled 2023-10-07 (×2): qty 1

## 2023-10-07 MED ORDER — TORSEMIDE 10 MG PO TABS: 30.0000 mg | ORAL_TABLET | Freq: Every day | ORAL | Status: DC

## 2023-10-07 MED ORDER — GABAPENTIN 100 MG PO CAPS
100.0000 mg | ORAL_CAPSULE | Freq: Every day | ORAL | Status: DC
  Administered 2023-10-07: 100 mg via ORAL
  Filled 2023-10-07: qty 1

## 2023-10-07 MED ORDER — HALOPERIDOL LACTATE 5 MG/ML IJ SOLN
5.0000 mg | Freq: Once | INTRAMUSCULAR | Status: AC | PRN
  Administered 2023-10-07: 5 mg via INTRAVENOUS
  Filled 2023-10-07: qty 1

## 2023-10-07 MED ORDER — ROSUVASTATIN CALCIUM 5 MG PO TABS
10.0000 mg | ORAL_TABLET | Freq: Every day | ORAL | Status: DC
  Administered 2023-10-07: 10 mg via ORAL
  Filled 2023-10-07: qty 2

## 2023-10-07 NOTE — Discharge Summary (Addendum)
 Physician Discharge Summary  Olaf Mesa ZOX:[id number] DOB: 30-Nov-1935 DOA: 10/06/2023  PCP: Lanae Pinal, MD  Admit date: 10/06/2023 Discharge date: 10/07/2023  Time spent: 40 minutes  Recommendations for Outpatient Follow-up:  Follow CBC/CMP  Follow statin outpatient - holding on starting this now with allergy to simvastatin causing blistering rash, consider further evaluation outpatient SVT/atrial tach - discharging with event monitor Follow SLP recs - dysphagia 2 diet, thin liquids - MBS pending Glipizide  on hold at discharge, AM BG's in 90's and 110's here - risk of hypoglycemia  SLP recommending MBS, will d/c 6/16 after MBS done  Discharge Diagnoses:  Principal Problem:   Acute CVA (cerebrovascular accident) (HCC) Active Problems:   Hyperlipidemia   Essential hypertension   NICM (nonischemic cardiomyopathy) (HCC)   Pulmonary hypertension (HCC)   Type 2 diabetes mellitus with vascular disease (HCC)   Smoldering multiple myeloma   Peripheral vascular disease (HCC)   Stroke [facility])   Discharge Condition: stable  Diet recommendation: heart healthy, diabetic  Filed Weights   10/06/23 0520  Weight: 91.2 kg    History of present illness:   Valery Chance  is Jeziah Kretschmer 88 y.o. male,  with history of chronic combined heart failure, nonischemic cardiomyopathy known ejection fraction 35%, stroke, prostate cancer status postradiation therapy and BPH, GERD, multiple myeloma, nonobstructive coronary artery disease, hypertension hyperlipidemia and type 2 diabetes, CKD stage IV, patient with recent hospitalization secondary to acute CVA, discharged to [facility], and he was discharged home from [facility] 09/26/2023, patient presents to ED secondary to complaints of facial tingling, numbness, prompted him to come to ED.  He was found to have Danzell Birky new stroke as well as subacute strokes.  Seen by neurology recommending DAPT indefinitely, cardiac monitor outpatient.    Pending MBS, see below for additional  details.   Hospital Course:  Assessment and Plan:  Acute CVA - Patient with recent hospitalization for acute CVA, significant for bilateral cerebellar infarct, right more than left, to be secondary to large vessel disease from severe posterior circulation stenosis. - MRI with acute nonhemorrhagic infarct in the R posterolateral pons (remote lacunar infarcts in the cerebellum bilaterally, L paramedian pons, and posterior R basal ganglia - evolving signal changes in the cerebellar peduncles c/w subacute ischemic event) (see report) - MRA with known bilateral vertebral artery occlusions, advanced anterior circulation atherosclerosis including severe R and moderate L ICA stenoses   - carotid US  with near normal bilateral carotids.  Antegrade flow to bilateral vertebral arteries. - recent echo with EF 25-30%, grade 1 diastolic dysfunction (not repeated during this admission.  - appreciate neurology assistance, suspected due to small vessel disease - recommended DAPT indefinitely.  Started lipitor.  Plan for outpatient cardiac event monitor.   - SLP recommending MBS, dysphagia 2, thin liquid  - A1c 7, LDL 73 ---- neurology started crestor, but allergy listed to simvastatin with blistering rash - will hold off on this.     Diastolic CHF Cardiomyopathy -During recent hospitalization 2D echo significant for EF 25 to 30% . -will gradually resume antihypertensives    Hypertension  Will gradually resume home meds starting with coreg   SVT Reviewed with cards, tele c/w Lailee Hoelzel tach.   He'll leave with an event monitor  Coreg     CKD stage IIIb At baseline, avoid nephrotoxic medications   BPH flomax    Diabetes mellitus, type II Hold glipizide  at discharge as BG's ok here and risk of hypoglycemia    OSA - Continue with CPAP DVT Prophylaxis Heparin   Obesity Body mass index is 31.49 kg/m.      Procedures: none   Consultations: neurology  Discharge Exam: Vitals:   10/07/23 1524  10/07/23 1700  BP: 122/83 120/80  Pulse: (!) 112 (!) 106  Resp: 20 19  Temp: 98.5 F (36.9 C) 98.5 F (36.9 C)  SpO2: 97%    Daughter at bedside He notes perioral numbness and slurred speech These symptoms similar from last stroke  General: No acute distress. Cardiovascular: Heart sounds show Isabel Ardila regular rate, and rhythm. No gallops or rubs. No murmurs. No JVD. Lungs: unlabored Abdomen: Soft, nontender, nondistended  Neurological: dysarthria, symmetric face.  Symmetric upper extremity strength (limited due to  Extremities: No clubbing or cyanosis. No edema.  Discharge Instructions   Discharge Instructions     Ambulatory referral to Neurology   Complete by: As directed    An appointment is requested in approximately: 2 weeks   Diet - low sodium heart healthy   Complete by: As directed    Discharge instructions   Complete by: As directed    You were seen with Renise Gillies recurrent stroke.    You've been seen by neurology who recommended continuing aspirin  and plavix  indefinitely.  You'll need to go home on Leba Tibbitts cardiac monitor (cardiology will mail this to you) to look for abnormal heart rhythms that would indicate Kaiana Marion change in your medications.  If you don't hear from cardiology regarding the monitor, call Dr. Mitzie Anda or Dr. Micael Adas.    You should follow up with neurology outpatient with regards to your strokes.  Your neck ultrasound showed near normal carotid arteries.  Your MRI brain showed significant atherosclerosis (plaque).   Resume your blood pressure medicines gradually.  Resume your coreg  tonight.  Tomorrow, you can resume your lasix .  Tuesday, you can start your imdur .    We've stopped your propanolol for your tremor as this is the same class of medicine as coreg .  If you have continued issues with tremor, ask neurology about Mattisyn Cardona trial of another medicine.  I think you should stop your glipizide .  Your blood sugars were ok here and glipizide  will put you at risk for low blood sugars.    Keep the splint on until you arrange follow up with Dr. Margarite Shearer (call to arrange an appointment).   Return for new, recurrent, or worsening symptoms.   Please ask your PCP to request records from this hospitalization so they know what was done and what the next steps will be.   Increase activity slowly   Complete by: As directed       Allergies as of 10/07/2023       Reactions   Tramadol Other (See Comments)   Dizziness (intolerance) hallucinate   Finasteride Swelling   Breast enlargement   Jardiance  [empagliflozin ]    Yeast infection   Lisinopril Cough   Ciprofloxacin Other (See Comments)   Dizziness (intolerance)   Simvastatin Hives, Other (See Comments)   Blisters/ Rash   Sulfa Antibiotics Rash        Medication List     STOP taking these medications    glipiZIDE  5 MG tablet Commonly known as: GLUCOTROL    propranolol  10 MG tablet Commonly known as: INDERAL        TAKE these medications    acetaminophen  325 MG tablet Commonly known as: TYLENOL  Take 2 tablets (650 mg total) by mouth every 4 (four) hours as needed for mild pain (pain score 1-3) (or temp > 37.5 C (99.5 F)).  aspirin  EC 81 MG tablet Take 1 tablet (81 mg total) by mouth daily. Swallow whole.   carvedilol  3.125 MG tablet Commonly known as: COREG  Take 1 tablet (3.125 mg total) by mouth 2 (two) times daily with [place]elah Minish meal.   clopidogrel  75 MG tablet Commonly known as: PLAVIX  Take 1 tablet (75 mg total) by mouth daily.   cyanocobalamin  1000 MCG tablet Take 1 tablet (1,000 mcg total) by mouth every morning.   diclofenac  Sodium 1 % Gel Commonly known as: VOLTAREN  Apply 2 g topically 4 (four) times daily. What changed:  when to take this reasons to take this   docusate sodium  100 MG capsule Commonly known as: COLACE Take 1 capsule (100 mg total) by mouth daily.   Fish Oil 1200 MG Caps Take 1,200 mg by mouth daily.   FREESTYLE LITE test strip Generic drug: glucose blood 1 each by  Other route daily in the afternoon. Use as instructed   FreeStyle Lite w/Device Kit 1 Device by Does not apply route daily in the afternoon.   gabapentin  100 MG capsule Commonly known as: NEURONTIN  Take 1 capsule (100 mg total) by mouth at bedtime.   Garlic 1000 MG Caps Take 1,000 mg by mouth every morning.   isosorbide  mononitrate 30 MG 24 hr tablet Commonly known as: IMDUR  Take 0.5 tablets (15 mg total) by mouth every morning. Start taking on: October 09, 2023 What changed: These instructions start on October 09, 2023. If you are unsure what to do until then, ask your doctor or other care provider.   meclizine  12.5 MG tablet Commonly known as: ANTIVERT  Take 1 tablet (12.5 mg total) by mouth daily as needed for dizziness.   modafinil  100 MG tablet Commonly known as: PROVIGIL  Take 1 tablet (100 mg total) by mouth daily.   Oyster Shell Calcium  500 MG Tabs Take 500 mg by mouth daily.   potassium chloride  SA 20 MEQ tablet Commonly known as: KLOR-CON  M Take 1 tablet (20 mEq total) by mouth daily.   rosuvastatin 10 MG tablet Commonly known as: CRESTOR Take 1 tablet (10 mg total) by mouth daily. Start taking on: October 08, 2023   scopolamine  1 MG/3DAYS Commonly known as: TRANSDERM-SCOP Place 1 patch (1.5 mg total) onto the skin every 3 (three) days.   tamsulosin  0.4 MG Caps capsule Commonly known as: FLOMAX  Take 1 capsule (0.4 mg total) by mouth 2 (two) times daily.   torsemide  10 MG tablet Commonly known as: DEMADEX  Take 3 tablets (30 mg total) by mouth daily. Start taking on: October 08, 2023   vitamin C 1000 MG tablet Take 4,000 mg by mouth every morning.       Allergies  Allergen Reactions   Tramadol Other (See Comments)    Dizziness (intolerance) hallucinate   Finasteride Swelling    Breast enlargement    Jardiance  [Empagliflozin ]     Yeast infection   Lisinopril Cough   Ciprofloxacin Other (See Comments)    Dizziness (intolerance)    Simvastatin Hives and  Other (See Comments)    Blisters/ Rash    Sulfa Antibiotics Rash    Follow-up Information     Koirala, Dibas, MD Follow up.   Specialty: Family Medicine Contact information: [address] [place] [address] [phone]         Merrill Abide, MD Follow up.   Specialty: Orthopedic Surgery Why: call for follow up for your right arm Contact information: [address] [place]  [place] [address] [phone]  The results of significant diagnostics from this hospitalization (including imaging, microbiology, ancillary and laboratory) are listed below for reference.    Significant Diagnostic Studies: VAS US  CAROTID (at [facility] and [facility] only) Result Date: 10/07/2023 Carotid Arterial Duplex Study Patient Name:  ELHADJI PECORE  Date of Exam:   10/07/2023 Medical Rec #: [id number]      Accession #:    [id number] Date of Birth: [birth year]      Patient Gender: M Patient Age:   28 years Exam Location:  [facility] Procedure:      VAS US  CAROTID Referring Phys: Margart Shears DE LA TORRE --------------------------------------------------------------------------------  Indications:       CVA, Numbness and Weakness. Risk Factors:      Hypertension, hyperlipidemia, Diabetes, past history of                    smoking, coronary artery disease. Other Factors:     CHF, NICM, CKD IV, Smoldering multiple myeloma. Limitations        Today's exam was limited due to the body habitus of the                    patient and patient unable to keep head turned. Comparison Study:  Prior normal carotid duplex done 09/12/21. Normal CTA of neck                    done 09/02/23 Performing Technologist: Carleene Chase RVS  Examination Guidelines: Marquavion Venhuizen complete evaluation includes B-mode imaging, spectral Doppler, color Doppler, and power Doppler as needed of all accessible portions of each vessel. Bilateral testing is considered an integral part of Birgitta Uhlir complete examination. Limited examinations  for reoccurring indications may be performed as noted.  Right Carotid Findings: +----------+--------+--------+--------+------------------+------------------+           PSV cm/sEDV cm/sStenosisPlaque DescriptionComments           +----------+--------+--------+--------+------------------+------------------+ CCA Prox  76      7                                 intimal thickening +----------+--------+--------+--------+------------------+------------------+ CCA Distal80      19                                intimal thickening +----------+--------+--------+--------+------------------+------------------+ ICA Prox  52      23                                                   +----------+--------+--------+--------+------------------+------------------+ ICA Mid   55      21                                                   +----------+--------+--------+--------+------------------+------------------+ ICA Distal31      14                                tortuous           +----------+--------+--------+--------+------------------+------------------+ ECA       37  2                                                    +----------+--------+--------+--------+------------------+------------------+ +----------+--------+-------+--------+-------------------+           PSV cm/sEDV cmsDescribeArm Pressure (mmHG) +----------+--------+-------+--------+-------------------+ [id number]                                         +----------+--------+-------+--------+-------------------+ +---------+--------+--+--------+-+ VertebralPSV cm/s25EDV cm/s8 +---------+--------+--+--------+-+  Left Carotid Findings: +----------+--------+--------+--------+------------------+------------------+           PSV cm/sEDV cm/sStenosisPlaque DescriptionComments           +----------+--------+--------+--------+------------------+------------------+ CCA Prox  79      9                                  intimal thickening +----------+--------+--------+--------+------------------+------------------+ CCA Distal55      9                                 intimal thickening +----------+--------+--------+--------+------------------+------------------+ ICA Prox  59      17                                tortuous           +----------+--------+--------+--------+------------------+------------------+ ICA Mid   45      17                                tortuous           +----------+--------+--------+--------+------------------+------------------+ ICA Distal42      12                                tortuous           +----------+--------+--------+--------+------------------+------------------+ ECA       27                                        tortuous           +----------+--------+--------+--------+------------------+------------------+ +----------+--------+--------+--------+-------------------+           PSV cm/sEDV cm/sDescribeArm Pressure (mmHG) +----------+--------+--------+--------+-------------------+ [id number]                                          +----------+--------+--------+--------+-------------------+   Summary: Right Carotid: The extracranial vessels were near-normal with only minimal wall                thickening or plaque. Left Carotid: The extracranial vessels were near-normal with only minimal wall               thickening or plaque. Vertebrals:  Bilateral vertebral arteries demonstrate antegrade flow. Subclavians: Normal flow hemodynamics were seen in bilateral subclavian  arteries. *See table(s) above for measurements and observations.     Preliminary    MR ANGIO HEAD WO CONTRAST Result Date: 10/06/2023 CLINICAL DATA:  Stroke, follow up. EXAM: MRA HEAD WITHOUT CONTRAST TECHNIQUE: Angiographic images of the Circle of Willis were acquired using MRA technique without intravenous contrast. COMPARISON:  CTA head and neck  09/02/2023 FINDINGS: Anterior circulation: The internal carotid arteries are patent from skull base to carotid termini with extensive atherosclerosis as shown on the prior CTA including grossly unchanged severe right and moderate left cavernous stenoses and mild right and severe left supraclinoid stenoses. 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 The left ACA is supplied by the anterior communicating artery with Yuma Pacella fenestration again noted in the anterior communicating region. The right ACA is widely patent. Both MCAs are patent without evidence of Lucy Woolever proximal branch occlusion. There are unchanged mild-to-moderate right M1 and no aneurysm is identified. Proximal left M2 stenoses. Posterior circulation: No flow related enhancement is evident in the included portions of the distal vertebral arteries or basilar artery with bilateral vertebral artery occlusion demonstrated on the prior CTA. There are patent, small posterior communicating arteries bilaterally, and the P2 segments are patent but small in caliber and irregular diffusely. No aneurysm is identified. Anatomic variants: None. IMPRESSION: 1. Known bilateral vertebral artery occlusions. Absent flow related enhancement in the basilar artery may reflect slow flow or interval occlusion. 2. Unchanged advanced anterior circulation atherosclerosis including severe right and moderate left ICA stenoses. Electronically Signed   By: Aundra Lee M.D.   On: 10/06/2023 17:12   MR BRAIN WO CONTRAST Result Date: 10/06/2023 EXAM: MRI BRAIN WITHOUT CONTRAST 10/06/2023 08:59:34 AM TECHNIQUE: Multiplanar multisequence MRI of the head/brain was performed without the administration of intravenous contrast. COMPARISON: MR head without contrast 09/11/2023. CLINICAL HISTORY: Neuro deficit, acute, stroke suspected. FINDINGS: BRAIN AND VENTRICLES: An acute nonhemorrhagic infarct is present along the posterolateral aspect of the right  pons. Previously seen restricted diffusion within the cerebellar peduncles bilaterally has near completely resolved. T2 changes are present. No other acute infarcts are present. Multiple remote lacunar infarcts are present in the cerebellum bilaterally. Left paramedian pontine remote infarct is again noted. Multiple punctate foci of remote hemorrhage is now present in the left cerebellar peduncle. No acute hemorrhage is present. Caela Huot remote nonhemorrhagic lacunar infarct is again noted in the posterior right basal ganglia. Mild atrophy and periventricular white matter change are otherwise within normal limits for age. Katiya Fike remote cortical infarct is present in the right occipital pole. ORBITS: Bilateral lens replacements are noted. The globes and orbits are otherwise within normal limits. SINUSES AND MASTOIDS: Demani Weyrauch small right mastoid effusion is present. BONES AND SOFT TISSUES: Normal marrow signal. No acute soft tissue abnormality. IMPRESSION: 1. Acute nonhemorrhagic infarct in the right posterolateral pons. No other acute infarcts. 2. Multiple remote lacunar infarcts in the cerebellum bilaterally, left paramedian pons, and posterior right basal ganglia. 3. Evolving signal changes in the cerebellar peduncles bilaterally are consistent with Naava Janeway now subacute ischemic event. 4. Remote cortical infarct in the right occipital pole. 5. Multiple punctate foci of remote hemorrhage in the left cerebellar peduncle. No acute hemorrhage. 6. Mild atrophy and periventricular white matter change within normal limits for age. Electronically signed by: Audree Leas MD 10/06/2023 09:18 AM EDT RP Workstation: [id number]   CT Head Wo Contrast Result Date: 10/06/2023 EXAM: CT HEAD WITHOUT CONTRAST 10/06/2023 06:40:44 AM TECHNIQUE: CT of the head was performed without the  administration of intravenous contrast. Automated exposure control, iterative reconstruction, and/or weight based adjustment of the mA/kV was utilized to reduce the  radiation dose to as low as reasonably achievable. COMPARISON: CT head without contrast 09/19/2023. MR head without contrast 09/11/2023. CLINICAL HISTORY: Numbness around the lips and left hand. Last known well at 11:00 pm last night. FINDINGS: BRAIN AND VENTRICLES: Evolving right PICA territory nonhemorrhagic infarct is noted. Bilateral subacute cerebellar peduncle infarcts are again noted. No acute hemorrhage is present. More remote lacunar infarcts are present in the cerebellum bilaterally. No acute supratentorial infarcts are present. ORBITS: Bilateral lens replacements are noted. The globes and orbits are otherwise within normal limits. SINUSES: The visualized paranasal sinuses and mastoid air cells demonstrate no acute abnormality. SOFT TISSUES AND SKULL: No acute abnormality of the visualized skull or soft tissues. VASCULATURE: Atherosclerotic calcifications are present in the cavernous carotid arteries bilaterally and at the dural margin of both vertebral arteries. No hyperdense vessel is present. IMPRESSION: 1. Evolving right thalamic territory nonhemorrhagic infarct. 2. Bilateral subacute cerebellar peduncle infarcts. 3. No acute hemorrhage. No acute supratentorial infarct. Electronically signed by: Audree Leas MD 10/06/2023 06:52 AM EDT RP Workstation: [id number]   DG Chest Portable 1 View Result Date: 10/06/2023 CLINICAL DATA:  Mental status changes. EXAM: PORTABLE CHEST 1 VIEW COMPARISON:  09/23/2021 FINDINGS: The lungs are clear without focal pneumonia, edema, pneumothorax or pleural effusion. The cardio pericardial silhouette is enlarged. No acute bony abnormality. Telemetry leads overlie the chest. IMPRESSION: Enlargement of the cardiopericardial silhouette without acute cardiopulmonary findings. Electronically Signed   By: Donnal Fusi M.D.   On: 10/06/2023 06:33   CT HEAD WO CONTRAST ( ) Result Date: 09/19/2023 CLINICAL DATA:  88 year old male with vision loss. Recent cerebellar  infarcts with abnormal signal in the cerebellar peduncles. EXAM: CT HEAD WITHOUT CONTRAST TECHNIQUE: Contiguous axial images were obtained from the base of the skull through the vertex without intravenous contrast. RADIATION DOSE REDUCTION: This exam was performed according to the departmental dose-optimization program which includes automated exposure control, adjustment of the mA and/or kV according to patient size and/or use of iterative reconstruction technique. COMPARISON:  Head CT 09/17/2023 and earlier. FINDINGS: Brain: Hypodense scattered bilateral cerebellar infarcts, and confluent hypodensity again noted in the bilateral cerebellar peduncles. No hemorrhagic transformation. No posterior fossa mass effect. Stable CT appearance from 2 days ago. No superimposed midline shift, ventriculomegaly, mass effect, evidence of mass lesion, intracranial hemorrhage or new cortically based infarct. Bilateral PCA territory and optic radiation region gray-white differentiation appears stable and normal except for subtle chronic right occipital pole encephalomalacia which is more apparent on MRI. Vascular: Extensive Calcified atherosclerosis at the skull base. No suspicious intracranial vascular hyperdensity. Skull: Intact, stable. Sinuses/Orbits: Visualized paranasal sinuses and mastoids are clear. Other: Stable postoperative appearance of the globes. No acute orbit or scalp soft tissue finding identified. IMPRESSION: 1. Stable CT appearance of bilateral cerebellar infarcts and abnormal middle cerebellar peduncles as seen on MRI. No hemorrhagic transformation or mass effect. 2. No new intracranial abnormality. Small area of chronic right occipital pole encephalomalacia better demonstrated by MRI. Electronically Signed   By: Marlise Simpers M.D.   On: 09/19/2023 11:48   CT HEAD WO CONTRAST ( ) Result Date: 09/17/2023 CLINICAL DATA:  Stroke follow-up EXAM: CT HEAD WITHOUT CONTRAST TECHNIQUE: Contiguous axial images were obtained  from the base of the skull through the vertex without intravenous contrast. RADIATION DOSE REDUCTION: This exam was performed according to the departmental dose-optimization program which includes automated exposure control, adjustment  of the mA and/or kV according to patient size and/or use of iterative reconstruction technique. COMPARISON:  09/11/2023 FINDINGS: Brain: Unchanged hypoattenuation within the middle cerebellar peduncles. 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 No mass, hemorrhage or extra-axial collection. Vascular: Atherosclerotic calcification of the internal carotid arteries at the skull base. No abnormal hyperdensity of the major intracranial arteries or dural venous sinuses. Skull: The visualized skull base, calvarium and extracranial soft tissues are normal. Sinuses/Orbits: No fluid levels or advanced mucosal thickening of the visualized paranasal sinuses. No mastoid or middle ear effusion. Normal orbits. Other: None. IMPRESSION: 1. Unchanged hypoattenuation within the middle cerebellar peduncles, corresponding to areas of diffusion restriction previously seen. 2. 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 Electronically Signed   By: Juanetta Nordmann M.D.   On: 09/17/2023 20:00   DG Swallowing Func-Speech Pathology Result Date: 09/12/2023 Table formatting from the original result was not included. Modified Barium Swallow Study Patient Details Name: Barton Want MRN: [id number] Date of Birth: 19-Nov-1935 Today's Date: 09/12/2023 HPI/PMH: HPI: Arvil Utz is an 88 year old right-handed male with history significant for chronic combined systolic and diastolic heart failure, nonischemic cardiomyopathy, CVA, prostate cancer status post XRT/BPH, GERD, gout, multiple myeloma, nonobstructive CAD, hyperlipidemia, hypertension, diabetes mellitus with peripheral neuropathy and CKD stage IV. Presented 09/02/2023 with progressive unsteady gait times several weeks as well as falls x 2.  MRI showed  patchy early subacute ischemic infarcts involving the right greater than left cerebellar hemispheres.  Associated minimal petechial blood products of the right cerebellum without hemorrhagic transformation. 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  Irregular reconstitution of the distal V2 segment of the right vertebral artery with occlusion of the right V3 and V4 segments.  Left vertebral artery occluded at the V2 segment from the level of C4-5.  CT cervical spine showed hypodense lesions in the cerebellar hemispheres, measuring 3.4 x 3.2 cm on the right 0.8 cm and 1.8 x 1.1 cm on the left. Was tolerating Hebah Bogosian regular consistency diet until 09/07/23 when patient developed significantly worsening motor speech and began coughing with thin liquids. Clinical Impression: Clinical Impression: Patient presents with mild oropharyngeal dysphagia primarily characterized by reductions to pharyngeal efficiency and safety d/t mistiming of the swallow with thin liquids. Orally, patient demonstrates weakness/numbness in the right cheek and lips resulting in anterior spillage with all liquids administered, escaping to mid chin. Pharyngeally, patient demosntrated reduction in base of tongue retraction to the posterior pharyngeal wall and reduced pharyngeal stripping wave resulting in residue in the vallecula and pyriform sinuses which mostly cleared with subsequent swallows. Once instance of aspiration was observed with consecutive cup sips of thin liquids, however suspect this is happening intermittently throughout the day given cough response after consecutive sips at bedside throughout evaluation and reports from daughter/nursing. Aspiration was secondary to mistiming and inefficiency as bolus material fell to the pyriform sinuses before the swallow, and material from the initial swallow was not fully cleared prior to next swallow resulting in overflow and resultant spillage into the trachea. Cough response was activated  but was not effective. Notably at bedside, patient has been noted to exhibit intermittent instances of oral holding and residue though not observed on today's study, thus recommend Dys3/thin liquids utilizing SINGLE SIPS strategy with liquid. NO STRAWS as patient demonstrated reduced cognitive ability to implement single sip strategies when straw was utilized. Administer medications whole in puree for ease. SLP will continue to follow. Factors that may increase risk of adverse event in presence of aspiration [name] & [name]  2021): Factors that may increase risk of adverse event in presence of aspiration [name] & [name] 2021): Weak cough; Frail or deconditioned; Limited mobility; Reduced cognitive function Recommendations/Plan: Swallowing Evaluation Recommendations Swallowing Evaluation Recommendations Recommendations: PO diet PO Diet Recommendation: Dysphagia 3 (Mechanical soft); Thin liquids (Level 0) Liquid Administration via: Cup; No straw Medication Administration: Whole meds with puree Supervision: Full supervision/cueing for swallowing strategies Swallowing strategies  : Slow rate; Small bites/sips; Check for pocketing or oral holding Postural changes: Position pt fully upright for meals Oral care recommendations: Oral care BID (2x/day) Treatment Plan Treatment Plan Treatment recommendations: Therapy as outlined in treatment plan below Follow-up recommendations: Outpatient SLP Functional status assessment: Patient has had Kazi Montoro recent decline in their functional status and demonstrates the ability to make significant improvements in function in Lanorris Kalisz reasonable and predictable amount of time. Treatment frequency: Min 2x/week Treatment duration: 2 weeks Interventions: Aspiration precaution training; Oropharyngeal exercises; Compensatory techniques; Patient/family education; Diet toleration management by SLP; Trials of upgraded texture/liquids; Respiratory muscle strength training Recommendations Recommendations for  follow up therapy are one component of Breeanna Galgano multi-disciplinary discharge planning process, led by the attending physician.  Recommendations may be updated based on patient status, additional functional criteria and insurance authorization. Assessment: Orofacial Exam: Orofacial Exam Oral Cavity: Oral Hygiene: WFL Oral Cavity - Dentition: Dentures, not available; Missing dentition; Poor condition Anatomy: Anatomy: WFL Boluses Administered: Boluses Administered Boluses Administered: Thin liquids (Level 0); Mildly thick liquids (Level 2, nectar thick); Moderately thick liquids (Level 3, honey thick); Puree; Solid  Oral Impairment Domain: Oral Impairment Domain Lip Closure: Escape progressing to mid-chin Tongue control during bolus hold: Cohesive bolus between tongue to palatal seal Bolus preparation/mastication: Timely and efficient chewing and mashing Bolus transport/lingual motion: Brisk tongue motion Oral residue: Residue collection on oral structures Location of oral residue : Tongue; Palate Initiation of pharyngeal swallow : Pyriform sinuses  Pharyngeal Impairment Domain: Pharyngeal Impairment Domain Soft palate elevation: No bolus between soft palate (SP)/pharyngeal wall (PW) Laryngeal elevation: Complete superior movement of thyroid  cartilage with complete approximation of arytenoids to epiglottic petiole Anterior hyoid excursion: Complete anterior movement Epiglottic movement: Complete inversion Laryngeal vestibule closure: Incomplete, narrow column air/contrast in laryngeal vestibule Pharyngeal stripping wave : Present - diminished Pharyngeal contraction (Quade Ramirez/P view only): N/Melayah Skorupski Pharyngoesophageal segment opening: Complete distension and complete duration, no obstruction of flow Tongue base retraction: Narrow column of contrast or air between tongue base and PPW Pharyngeal residue: Collection of residue within or on pharyngeal structures Location of pharyngeal residue: Valleculae; Pyriform sinuses  Esophageal  Impairment Domain: Esophageal Impairment Domain Esophageal clearance upright position: Complete clearance, esophageal coating Pill: Pill Consistency administered: Thin liquids (Level 0) Thin liquids (Level 0): [facility] Penetration/Aspiration Scale Score: Penetration/Aspiration Scale Score 1.  Material does not enter airway: Mildly thick liquids (Level 2, nectar thick); Moderately thick liquids (Level 3, honey thick); Puree; Solid; Pill 7.  Material enters airway, passes BELOW cords and not ejected out despite cough attempt by patient: Thin liquids (Level 0) Compensatory Strategies: Compensatory Strategies Compensatory strategies: Yes Other(comment): Effective (single sips) Effective Other(comment): Thin liquid (Level 0)   General Information: Caregiver present: No  Diet Prior to this Study: Regular; Thin liquids (Level 0)   Temperature : Normal   Respiratory Status: WFL   Supplemental O2: None (Room air)   History of Recent Intubation: No  Behavior/Cognition: Alert; Cooperative; Requires cueing Self-Feeding Abilities: Needs assist with self-feeding Baseline vocal quality/speech: Hypophonia/low volume Volitional Cough: Able to elicit Volitional Swallow: Able to elicit Exam Limitations: No limitations  Goal Planning: Prognosis for improved oropharyngeal function: Good Barriers to Reach Goals: Cognitive deficits No data recorded No data recorded Consulted and agree with results and recommendations: Patient Pain: Pain Assessment Pain Assessment: No/denies pain End of Session: Start Time:No data recorded Stop Time: No data recorded Time Calculation:No data recorded Charges: No data recorded SLP visit diagnosis: SLP Visit Diagnosis: Dysphagia, oropharyngeal phase (R13.12) Past Medical History: Past Medical History: Diagnosis Date  Chronic combined systolic and diastolic CHF (congestive heart failure) (HCC)   CKD (chronic kidney disease), stage III (HCC)   Hyperlipidemia   Hypertension   Lower extremity edema   Mild CAD   Rihan Schueler.  mild-mod by cath 05/2018.  Multiple myeloma (HCC) 11/15/2018  NICM (nonischemic cardiomyopathy) (HCC)   Peripheral neuropathy   Prostate cancer (HCC)   Status post XRT  Rheumatic fever   Stroke (HCC)   Trifascicular block   Type 2 diabetes mellitus (HCC)   Vertigo  Past Surgical History: Past Surgical History: Procedure Laterality Date  RIGHT HEART CATH N/Ikenna Ohms 07/10/2018  Procedure: RIGHT HEART CATH;  Surgeon: Darlis Eisenmenger, MD;  [facility] INVASIVE CV LAB;  Service: Cardiovascular;  Laterality: N/Gedalya Jim;  RIGHT/LEFT HEART CATH AND CORONARY ANGIOGRAPHY N/Brendi Mccarroll 06/18/2018  Procedure: RIGHT/LEFT HEART CATH AND CORONARY ANGIOGRAPHY;  Surgeon: Millicent Ally, MD;  Location: [facility] INVASIVE CV LAB;  Service: Cardiovascular;  Laterality: N/Kalief Kattner; Ashley Makaiyah Schweiger Ellin 09/12/2023, 9:51 AM  MR BRAIN WO CONTRAST Result Date: 09/12/2023 CLINICAL DATA:  Initial evaluation for acute altered mental status. EXAM: MRI HEAD WITHOUT CONTRAST TECHNIQUE: Multiplanar, multiecho pulse sequences of the brain and surrounding structures were obtained without intravenous contrast. COMPARISON:  CT from earlier the same day as well as MRI from 09/08/2023 FINDINGS: Brain: Age-related cerebral atrophy with mild chronic microvascular ischemic disease. Multiple scattered remote cerebellar infarcts. Remote lacunar infarct at the posterior right lentiform nucleus. Evolving subacute infarct within the right cerebellum with mild residual diffusion signal abnormality, slightly less prominent from prior. Additional well-circumscribed foci of restricted diffusion involving the middle cerebellar peduncles again seen, similar to prior, and could reflect ischemic infarcts and/or evolving changes of wallerian degeneration as previously noted. No associated hemorrhage or significant regional mass effect. No other evidence for acute or interval infarction. Gray-white matter differentiation otherwise maintained. No acute or chronic intracranial blood products. No mass lesion,  midline shift or mass effect. No hydrocephalus or extra-axial fluid collection. Pituitary gland and suprasellar region within normal limits. Vascular: Irregular flow voids throughout the vertebrobasilar system, similar to prior, and better evaluated on prior CTA. Anterior arterial intracranial flow voids are well maintained. Skull and upper cervical spine: Craniocervical junction within normal limits. Bone marrow signal intensity within normal limits. No scalp soft tissue abnormality. Sinuses/Orbits: Prior bilateral ocular lens replacement. Paranasal sinuses remain largely clear. Trace right mastoid effusion, of doubtful significance. Other: None. IMPRESSION: 1. Persistent foci of restricted diffusion involving the middle cerebellar peduncles, similar to prior, and could reflect ischemic infarcts and/or evolving changes of Wallerian degeneration as previously noted. 2. Evolving subacute infarct within the right cerebellum, slightly less prominent as compared to prior. No associated hemorrhage or significant regional mass effect. 3. No other new acute intracranial abnormality. 4. Underlying age-related cerebral atrophy with mild chronic microvascular ischemic disease, with multiple remote cerebellar and right basal ganglia infarcts. Electronically Signed   By: Virgia Griffins M.D.   On: 09/12/2023 02:11   CT HEAD WO CONTRAST ( ) Result Date: 09/11/2023 CLINICAL DATA:  Initial evaluation for acute altered mental status. EXAM: CT  HEAD WITHOUT CONTRAST TECHNIQUE: Contiguous axial images were obtained from the base of the skull through the vertex without intravenous contrast. RADIATION DOSE REDUCTION: This exam was performed according to the departmental dose-optimization program which includes automated exposure control, adjustment of the mA and/or kV according to patient size and/or use of iterative reconstruction technique. COMPARISON:  CT from 09/10/2023 and MRI from 09/08/2023 FINDINGS: Brain: Age-related  cerebral atrophy with chronic microvascular ischemic disease. Multiple remote cerebellar infarcts again noted. Well-circumscribed hypodensities involving the cerebellum/middle cerebellar peduncles, consistent with edema, likely due to evolving wallerian degeneration and/or ischemia. Overall appearance is not significantly changed from previous exams. No other acute large vessel territory infarct. No acute intracranial hemorrhage. No mass lesion or midline shift. No hydrocephalus or extra-axial fluid collection. Vascular: No abnormal hyperdense vessel. Calcified atherosclerosis present at the skull base. Skull: Scalp soft tissues within normal limits.  Calvarium intact. Sinuses/Orbits: Globes and orbital soft tissues demonstrate no acute finding. Paranasal sinuses are largely clear. No mastoid effusion. Other: None. IMPRESSION: 1. Stable head CT with no significant interval change in appearance of the brain as compared to previous exams. 2. Evolving hypodensities involving the cerebellum/middle cerebellar peduncles, consistent with evolving wallerian degeneration and/or ischemia, stable from prior. 3. No other new acute intracranial abnormality. 4. Underlying atrophy with chronic microvascular ischemic disease. Electronically Signed   By: Virgia Griffins M.D.   On: 09/11/2023 22:50   CT HAND RIGHT WO CONTRAST Result Date: 09/11/2023 CLINICAL DATA:  Pain and swelling EXAM: CT OF THE RIGHT HAND WITHOUT CONTRAST TECHNIQUE: Multidetector CT imaging of the right hand was performed according to the standard protocol. Multiplanar CT image reconstructions were also generated. RADIATION DOSE REDUCTION: This exam was performed according to the departmental dose-optimization program which includes automated exposure control, adjustment of the mA and/or kV according to patient size and/or use of iterative reconstruction technique. COMPARISON:  Wrist radiographs 09/02/2023 FINDINGS: Bones/Joint/Cartilage Chondrocalcinosis  dorsal and volar to the radiocarpal joint and midcarpal row and in the TFCC disc compatible with chondrocalcinosis and possible CPPD arthropathy. Small ossicle posterior to the triquetrum, cortical discontinuity along presumed donor site, compatible with triquetral fracture, cannot exclude acute chronicity, correlate with dorsal point tenderness over the triquetrum. Scattered geodes along the carpus and along the distal ulnar articular surface and radial styloid articular surface. Mild degenerative spurring along the carpus. Ligaments Suboptimally assessed by CT. Muscles and Tendons Unremarkable Soft tissues Abnormal thickened appearance of the flexor retinaculum for example on image 105 SERIES 7. Subcutaneous edema along the dorsal-ulnar margin of the wrist. IMPRESSION: 1. Small ossicle posterior to the triquetrum, cortical discontinuity along presumed donor site, compatible with triquetral fracture, cannot exclude acute chronicity, correlate with dorsal point tenderness over the triquetrum. 2. Abnormal thickened appearance of the flexor retinaculum, query remote flexor retinacular injury or prior release with resulting fibrosis. 3. 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 4. Scattered geodes along the carpus and along the distal ulnar articular surface and radial styloid articular surface. 5. Subcutaneous edema along the dorsal-ulnar margin of the wrist. Electronically Signed   By: Freida Jes M.D.   On: 09/11/2023 10:48   CT FOREARM RIGHT WO CONTRAST Result Date: 09/11/2023 CLINICAL DATA:  Pain and swelling in the forearm EXAM: CT OF THE RIGHT FOREARM WITHOUT CONTRAST TECHNIQUE: Multidetector CT imaging was performed according to the standard protocol. Multiplanar CT image reconstructions were also generated. RADIATION DOSE REDUCTION: This  exam was performed according to the  departmental dose-optimization program which includes automated exposure control, adjustment of the mA and/or kV according to patient size and/or use of iterative reconstruction technique. COMPARISON:  Elbow radiographs 09/04/2023 and wrist radiographs 09/02/2023 FINDINGS: Bones/Joint/Cartilage Elbow joint effusion noted. Spurring of the radial head, coronoid process, and olecranon. Scattered small geodes of the carpus distal ulnar articular surface as well as the radial styloid. Chondrocalcinosis of the TFCC disc and volar and dorsal to the carpus. Age-indeterminate dorsal triquetral fracture. Ligaments Suboptimally assessed by CT. Muscles and Tendons Unremarkable Soft tissues Mild subcutaneous edema along the ulnar side of the forearm. IMPRESSION: 1. Age-indeterminate dorsal triquetral fracture. 2. Elbow joint effusion. 3. Chondrocalcinosis of the TFCC disc and volar and dorsal to the carpus. 4. Scattered small geodes of the carpus distal ulnar articular surface as well as the radial styloid. 5. Mild subcutaneous edema along the ulnar side of the forearm. Electronically Signed   By: Freida Jes M.D.   On: 09/11/2023 10:41   VAS US  UPPER EXTREMITY VENOUS DUPLEX Result Date: 09/10/2023 UPPER VENOUS STUDY  Patient Name:  ELLIOTT LASECKI  Date of Exam:   09/10/2023 Medical Rec #: [id number]      Accession #:    [id number] Date of Birth: Sep 01, 1935      Patient Gender: M Patient Age:   67 years Exam Location:  [facility] Procedure:      VAS US  UPPER EXTREMITY VENOUS DUPLEX Referring Phys: Estill Hemming [facility] --------------------------------------------------------------------------------  Indications: Pain, and Swelling Limitations: Poor ultrasound/tissue interface and musculoskeletal features. Comparison Study: No prior study on file Performing Technologist: Carleene Chase RVS  Examination Guidelines: Taji Sather complete evaluation includes B-mode imaging, spectral Doppler, color Doppler, and power Doppler as needed  of all accessible portions of each vessel. Bilateral testing is considered an integral part of Jamy Whyte complete examination. Limited examinations for reoccurring indications may be performed as noted.  Right Findings: +----------+------------+---------+-----------+----------+-------+ RIGHT     CompressiblePhasicitySpontaneousPropertiesSummary +----------+------------+---------+-----------+----------+-------+ IJV                      Yes       Yes                      +----------+------------+---------+-----------+----------+-------+ Subclavian               Yes       Yes                      +----------+------------+---------+-----------+----------+-------+ Axillary                 Yes       Yes                      +----------+------------+---------+-----------+----------+-------+ Brachial      Full                                          +----------+------------+---------+-----------+----------+-------+ Radial        Full       Yes       Yes                      +----------+------------+---------+-----------+----------+-------+ Ulnar         Full                                          +----------+------------+---------+-----------+----------+-------+  Cephalic      Full                                          +----------+------------+---------+-----------+----------+-------+ Basilic                  Yes       Yes                      +----------+------------+---------+-----------+----------+-------+  Left Findings: +----------+------------+---------+-----------+----------+-------+ LEFT      CompressiblePhasicitySpontaneousPropertiesSummary +----------+------------+---------+-----------+----------+-------+ Subclavian               Yes       Yes                      +----------+------------+---------+-----------+----------+-------+  Summary:  Right: No evidence of deep vein thrombosis in the upper extremity. No evidence of superficial vein  thrombosis in the upper extremity. Tissue disturbance and subcutaneous edema noted throughout the right upper extremity.  Left: No evidence of thrombosis in the subclavian.  *See table(s) above for measurements and observations.  Diagnosing physician: Runell Countryman Electronically signed by Runell Countryman on 09/10/2023 at 3:44:44 PM.    Final    CT HEAD WO CONTRAST ( ) Result Date: 09/10/2023 CLINICAL DATA:  88 year old male with neurologic deficit. Bilateral cerebellar infarcts. Progressive signal abnormality in the cerebellar peduncles on MRI 2 days ago. Subsequent encounter. EXAM: CT HEAD WITHOUT CONTRAST TECHNIQUE: Contiguous axial images were obtained from the base of the skull through the vertex without intravenous contrast. RADIATION DOSE REDUCTION: This exam was performed according to the departmental dose-optimization program which includes automated exposure control, adjustment of the mA and/or kV according to patient size and/or use of iterative reconstruction technique. COMPARISON:  Brain MRI 09/08/2023 and earlier. FINDINGS: Brain: Patchy, confluent hypodensity in the bilateral cerebellar hemispheres and cerebellar peduncles corresponding to recent MRI appearance. No hemorrhagic transformation. No posterior fossa mass effect. Basilar cisterns remain normal. No ventriculomegaly. Elsewhere the brainstem gray-white differentiation appears stable. Supratentorial gray-white differentiation is stable. Incidental dural calcification along the falx. No new cerebral edema identified. Vascular: Calcified atherosclerosis at the skull base. No suspicious intracranial vascular hyperdensity. Skull: Stable and intact. Sinuses/Orbits: Visualized paranasal sinuses and mastoids are stable and well aerated. Other: No acute orbit or scalp soft tissue finding. IMPRESSION: 1. Stable from recent MRI. Cerebellar and cerebellar peduncle infarct related edema with no hemorrhagic transformation or mass effect. 2. No new  intracranial abnormality. Electronically Signed   By: Marlise Simpers M.D.   On: 09/10/2023 12:33   MR BRAIN WO CONTRAST Addendum Date: 09/09/2023 ADDENDUM REPORT: 09/09/2023 07:52 ADDENDUM: Study discussed by telephone with Dr. Bonnita Buttner, Neurology on 09/09/2023 at 07:49 . We discussed that when compared to 09/02/2023 the progressive and strikingly symmetric signal changes in the bilateral cerebellar peduncles now are most consistent with developing Wallerian degeneration in response to the bilateral cerebellar infarcts about Zemirah Krasinski week ago. Those primary infarcts have partially faded now. Early middle cerebellar peduncle signal changes were present initially. No evidence of associated hemorrhage, no mass effect. CONCLUSION: Progressive bilateral cerebellar peduncle Wallerian degeneration suspected as Annise Boran consequence of bilateral cerebellar hemisphere infarcts earlier this month. No hemorrhage or mass effect. Electronically Signed   By: Marlise Simpers M.D.   On: 09/09/2023 07:52   Result Date: 09/09/2023 CLINICAL DATA:  Neuro deficit, acute, stroke suspected EXAM: MRI HEAD  WITHOUT CONTRAST TECHNIQUE: Multiplanar, multiecho pulse sequences of the brain and surrounding structures were obtained without intravenous contrast. COMPARISON:  CTA Head/neck Sep 02, 2023. FINDINGS: Brain: Acute infarcts in bilateral cerebellum and middle cerebellar peduncles. Associated edema without significant mass effect. Remote bilateral cerebellar infarcts. Remote infarct in the posterior limb right internal capsule. Patchy T2/FLAIR hyperintensities the white matter, compatible chronic microvascular disease. No evidence of midline shift, acute hemorrhage, mass lesion or hydrocephalus. Vascular: Better evaluated on same day CTA. Skull and upper cervical spine: Normal marrow signal. Sinuses/Orbits: Negative. Other: No mastoid effusions. IMPRESSION: Acute infarcts in bilateral cerebellum and middle cerebellar peduncles. Electronically Signed: By: Stevenson Elbe M.D. On: 09/08/2023 23:54    Microbiology: No results found for this or any previous visit (from the past 240 hours).   Labs: Basic Metabolic Panel: Recent Labs  Lab 10/06/23 0612 10/07/23 0659  NA 141 142  K 3.4* 3.5  CL 101 103  CO2 30 26  GLUCOSE 92 113*  BUN 21 19  CREATININE 1.86* 1.65*  CALCIUM  9.9 10.0   Liver Function Tests: Recent Labs  Lab 10/06/23 0612  AST 20  ALT 19  ALKPHOS 70  BILITOT 0.6  PROT 6.9  ALBUMIN  3.0*   No results for input(s): LIPASE, AMYLASE in the last 168 hours. No results for input(s): AMMONIA in the last 168 hours. CBC: Recent Labs  Lab 10/06/23 0612 10/07/23 0659  WBC 4.2 4.9  NEUTROABS 2.8  --   HGB 10.8* 11.5*  HCT 33.7* 35.4*  MCV 101.2* 99.4  PLT 128* 135*   Cardiac Enzymes: No results for input(s): CKTOTAL, CKMB, CKMBINDEX, TROPONINI in the last 168 hours. BNP: BNP (last 3 results) Recent Labs    07/20/23 1450 07/30/23 0938  BNP 108.9* 228.7*    ProBNP (last 3 results) No results for input(s): PROBNP in the last 8760 hours.  CBG: No results for input(s): GLUCAP in the last 168 hours.     Signed:  Donnetta Gains MD.  [facility] 10/07/2023, 6:38 PM

## 2023-10-07 NOTE — TOC Transition Note (Signed)
 Transition of Care [facility]) - Discharge Note   Patient Details  Name: Allen Dyer MRN: [id number] Date of Birth: [birth year]  [facility] CM/SW Contact:  Jeffory Mings, [place] Phone Number: 10/07/2023, 5:57 PM   Clinical Narrative:  Per MD, pt for dc home today. Per prior CIR dc note, pt active with [facility] for HHPT/OT/RN/CNA/SLP, message left for Loetta Ringer with [facility] notifying her of dc. Pt's dtr [name] requesting dc home via [facility] and confirmed she will be home to receive him. [facility] arranged for transport, no ETA available.   Paullette Boston, MSW, LCSW [phone] (coverage)       Final next level of care: Home w Home Health Services Barriers to Discharge: Barriers Resolved   Patient Goals and CMS Choice            Discharge Placement                Patient to be transferred to facility by: [facility] Name of family member notified: [name] Patient and family notified of of transfer: 10/07/23  Discharge Plan and Services Additional resources added to the After Visit Summary for                            [facility] Arranged: RN, PT, OT, Nurse's Aide, Speech Therapy HH Agency: [facility] Date [facility] Agency Contacted: 10/07/23   Representative spoke with at [facility] Agency: message left for [employer] Drivers of Health (SDOH) Interventions SDOH Screenings   Food Insecurity: No Food Insecurity (09/02/2023)  Housing: Low Risk  (09/02/2023)  Transportation Needs: No Transportation Needs (09/02/2023)  Utilities: Not At Risk (09/02/2023)  Depression (PHQ2-9): Low Risk  (08/16/2023)  Social Connections: Moderately Integrated (09/02/2023)  Tobacco Use: Medium Risk (10/06/2023)  Health Literacy: Adequate Health Literacy (01/24/2023)     Readmission Risk Interventions     No data to display

## 2023-10-07 NOTE — Progress Notes (Signed)
 TRH night cross cover note:  I was notified by RN that this patient is agitated, pulling at medical equipment, including cpap mask, with these behaviors refractory to attempts at verbal redirection.  In the setting of associated interference with ongoing medical treatment posing potential harm to themself, I have placed order for haldol 5 mg iv x 1 dose prn for agitation.    Camelia Cavalier, DO Hospitalist

## 2023-10-07 NOTE — Evaluation (Signed)
 Clinical/Bedside Swallow Evaluation Patient Details  Name: Allen Dyer MRN: [id number] Date of Birth: 1935/08/16  Today's Date: 10/07/2023 Time: SLP Start Time (ACUTE ONLY): 1340 SLP Stop Time (ACUTE ONLY): 1358 SLP Time Calculation (min) (ACUTE ONLY): 18 min  Past Medical History:  Past Medical History:  Diagnosis Date   Chronic combined systolic and diastolic CHF (congestive heart failure) (HCC)    CKD (chronic kidney disease), stage III (HCC)    Hyperlipidemia    Hypertension    Lower extremity edema    Mild CAD    a. mild-mod by cath 05/2018.   Multiple myeloma (HCC) 11/15/2018   NICM (nonischemic cardiomyopathy) (HCC)    Peripheral neuropathy    Prostate cancer (HCC)    Status post XRT   Rheumatic fever    Stroke (HCC)    Trifascicular block    Type 2 diabetes mellitus (HCC)    Vertigo    Past Surgical History:  Past Surgical History:  Procedure Laterality Date   RIGHT HEART CATH N/A 07/10/2018   Procedure: RIGHT HEART CATH;  Surgeon: Darlis Eisenmenger, MD;  Location: [facility] INVASIVE CV LAB;  Service: Cardiovascular;  Laterality: N/A;   RIGHT/LEFT HEART CATH AND CORONARY ANGIOGRAPHY N/A 06/18/2018   Procedure: RIGHT/LEFT HEART CATH AND CORONARY ANGIOGRAPHY;  Surgeon: Millicent Ally, MD;  Location: [facility] INVASIVE CV LAB;  Service: Cardiovascular;  Laterality: N/A;   HPI:  88 yo male presents to [facility] on 6/14 with facial paresthesias, breathing difficulty, difficulty ambulating. MRI shows acute R lateral pontine infarct. MBS 5/21 showed aspiration of consecutive sips of thin liquids with cough, but no ejection. Pt drinking from a Provale cup upon d/c. PMH significant for R>L cerebellar CVA 09/02/2023 with d/c from AIR 6/6; CVA in bilat cerebellar and [employer] territories 2023 with residual R weakness and speech difficulty; chronic combined CHF; CAD; stage 4 CKD; HTN; HLD; multiple myeloma; prostate CA s/p XRT; and DM    Assessment / Plan / Recommendation  Clinical Impression  Pt has  awareness of prior diet recommendations, continues to use Provale cup at home, primarily eats a minced/moist diet due to missing dentition. Today he has minimal anterior spillage on right. Potentially has new lingual weakness on the left, though dysarthria per family has improved a great deal since his first stroke and isnt any worse today. When drinking from his Provale cup there is intermittent coughing. Pt says thats my fault because he went too fast. Recommend pt resume a dys 2 (minced and moist diet) with thin liquids from a Provale cup given no severe change noted subjectively. However also recommend f/u MBS tomorrow to reassess swallow function given new CVA and ongoing signs of dysphagia. SLP Visit Diagnosis: Dysphagia, oropharyngeal phase (R13.12)    Aspiration Risk  Mild aspiration risk    Diet Recommendation Dysphagia 2 (Fine chop);Thin liquid    Liquid Administration via: Other (Comment) (provale cup) Medication Administration: Whole meds with liquid Supervision: Patient able to self feed Compensations: Minimize environmental distractions;Slow rate;Small sips/bites Postural Changes: Seated upright at 90 degrees    Other  Recommendations Oral Care Recommendations: Oral care BID     Assistance Recommended at Discharge    Functional Status Assessment Patient has had a recent decline in their functional status and demonstrates the ability to make significant improvements in function in a reasonable and predictable amount of time.  Frequency and Duration min 2x/week  2 weeks       Prognosis Prognosis for improved oropharyngeal function: Good  Swallow Study   General HPI: 88 yo male presents to [facility] on 6/14 with facial paresthesias, breathing difficulty, difficulty ambulating. MRI shows acute R lateral pontine infarct. MBS 5/21 showed aspiration of consecutive sips of thin liquids with cough, but no ejection. Pt drinking from a Provale cup upon d/c. PMH significant for R>L  cerebellar CVA 09/02/2023 with d/c from AIR 6/6; CVA in bilat cerebellar and [employer] territories 2023 with residual R weakness and speech difficulty; chronic combined CHF; CAD; stage 4 CKD; HTN; HLD; multiple myeloma; prostate CA s/p XRT; and DM Type of Study: Bedside Swallow Evaluation Previous Swallow Assessment: see HPI Diet Prior to this Study: NPO Temperature Spikes Noted: No Respiratory Status: Room air History of Recent Intubation: No Behavior/Cognition: Alert;Cooperative;Requires cueing Oral Cavity Assessment: Within Functional Limits Oral Care Completed by SLP: No Oral Cavity - Dentition: Dentures, not available;Missing dentition;Poor condition Vision: Functional for self-feeding Self-Feeding Abilities: Able to feed self;Needs assist Patient Positioning: Upright in chair Baseline Vocal Quality: Normal Volitional Cough: Weak Volitional Swallow: Able to elicit    Oral/Motor/Sensory Function Overall Oral Motor/Sensory Function: Moderate impairment Facial ROM: Reduced right Facial Symmetry: Abnormal symmetry right Facial Strength: Reduced right Lingual ROM: Within Functional Limits Lingual Symmetry: Abnormal symmetry left;Suspected CN XII (hypoglossal) dysfunction Lingual Strength: Suspected CN XII (hypoglossal) dysfunction   Ice Chips Ice chips: Within functional limits Presentation: Spoon   Thin Liquid Thin Liquid: Impaired Presentation: Cup (provale cup) Oral Phase Impairments: Reduced labial seal Oral Phase Functional Implications: Right anterior spillage Pharyngeal  Phase Impairments: Cough - Immediate    Nectar Thick Nectar Thick Liquid: Not tested   Honey Thick Honey Thick Liquid: Not tested   Puree Puree: Within functional limits Presentation: Spoon;Self Fed   Solid     Solid: Impaired Presentation: Self Fed Oral Phase Impairments: Impaired mastication Oral Phase Functional Implications: Impaired mastication;Oral residue       Mellissa Conley, Hardin Leys 10/07/2023,2:01 PM

## 2023-10-07 NOTE — Progress Notes (Unsigned)
{  This patient may be at risk for Amyloid. He has one or more dx on the prob list or PMH from the following list -  Abnormal EKG, HFpEF/Diastolic CHF, Aortic Stenosis, LVH, Bilateral Carpal Tunnel Syndrome, Biceps Tendon Rupture, Spinal Stenosis, Pericardial Effusion, Left Atrial Enlargement, Conduction System Disorder. See list below or review PMH.  Diagnoses From Problem List           Noted     Acute CHF (congestive heart failure) (HCC) 06/18/2018     Acute on chronic combined systolic and diastolic CHF (congestive heart failure) (HCC) Unknown     Acute on chronic congestive heart failure (HCC) 07/04/2018     CHF exacerbation (HCC) 07/03/2018     Chronic combined systolic (congestive) and diastolic (congestive) heart failure (HCC) Unknown    Click HERE to open Cardiac Amyloid Screening SmartSet to order screening OR Click HERE to defer testing for 1 year or permanently :1}    OFFICE NOTE Date:  10/07/2023  ID:  Heather Litter, DOB 1936/03/27, MRN [id number] PCP: Lanae Pinal, MD  [facility] Providers Cardiologist:  Gaylyn Keas, MD { Click to update primary MD,subspecialty MD or APP then REFRESH:1}    Patient Profile:    *** {There is no content from the last Narrative History section.}     Discussed the use of AI scribe software for clinical note transcription with the patient, who gave verbal consent to proceed.  History of Present Illness     ROS-See HPI***    Studies Reviewed:      *** Results  Risk Assessment/Calculations: {Does this patient have ATRIAL FIBRILLATION?[phone]}        Physical Exam:  VS:  There were no vitals taken for this visit.   Wt Readings from Last 3 Encounters:  10/06/23 91.2 kg  09/27/23 91.2 kg  09/02/23 97.5 kg    Physical Exam***     Assessment and Plan: Assessment & Plan  Assessment and Plan Assessment & Plan    {      :1}    {Are you ordering a CV Procedure (e.g. stress test, cath, DCCV, TEE, etc)?   Press F2         :[id number]}  Dispo:  No follow-ups on file. Signed, Marlyse Single, PA-C

## 2023-10-07 NOTE — Progress Notes (Signed)
 On call TRH contacted overnight re: agitation & update - episode of non sustained elevated HR w/ agitation  X1 haldol overnight - effective

## 2023-10-07 NOTE — Progress Notes (Signed)
Pt does not want to wear CPAP tonight. 

## 2023-10-07 NOTE — Evaluation (Signed)
 Occupational Therapy Evaluation Patient Details Name: Allen Dyer MRN: [id number] DOB: [birth year] Today's Date: 10/07/2023   History of Present Illness   88 yo male presents to [facility] on 6/14 with facial paresthesias, breathing difficulty, difficulty ambulating. MRI shows acute R lateral pontine infarct. PMH significant for R>L cerebellar CVA 09/02/2023 with d/c from AIR 6/6; CVA in bilat cerebellar and [employer] territories 2023 with residual R weakness and speech difficulty; chronic combined CHF; CAD; stage 4 CKD; HTN; HLD; multiple myeloma; prostate CA s/p XRT; and DM     Clinical Impressions PTA, pt lives with daughter and her family who assist with transfers and ADL. Upon eval, pt with mild L inattention, decreased strength and balance. Unclear whether vision has changed again from previous CVA. Pt currently needing min A for UB ADL and mod-max A for LB ADL. Pt with decreased proprioception and spatial orientation noted as well. Pt undershooting with bringing objects to mouth (cup for rinsing and spitting during oral care). Will continue to follow acutely, and recommending HHOT at dc.      If plan is discharge home, recommend the following:   A lot of help with walking and/or transfers;A lot of help with bathing/dressing/bathroom;Assistance with cooking/housework;Direct supervision/assist for medications management;Direct supervision/assist for financial management;Assist for transportation;Help with stairs or ramp for entrance     Functional Status Assessment   Patient has had a recent decline in their functional status and demonstrates the ability to make significant improvements in function in a reasonable and predictable amount of time.     Equipment Recommendations   BSC/3in1     Recommendations for Other Services         Precautions/Restrictions   Precautions Precautions: Fall Recall of Precautions/Restrictions: Intact Precaution/Restrictions Comments: history of  ataxia and urinary incontinence, since previous CVA 08/2023 Required Braces or Orthoses: Splint/Cast Splint/Cast: r wrist removable cast for triquetrium fracture Splint/Cast - Date Prophylactic Dressing Applied (if applicable): 10/04/23 Restrictions Weight Bearing Restrictions Per Provider Order: No RUE Weight Bearing Per Provider Order: Weight bearing as tolerated (as of 1 month ago)     Mobility Bed Mobility               General bed mobility comments: up in chair    Transfers Overall transfer level: Needs assistance Equipment used: Rolling walker (2 wheels) Transfers: Sit to/from Stand, Bed to chair/wheelchair/BSC Sit to Stand: Mod assist     Step pivot transfers: Min assist            Balance Overall balance assessment: Needs assistance Sitting-balance support: No upper extremity supported, Feet supported Sitting balance-Leahy Scale: Fair     Standing balance support: Bilateral upper extremity supported, During functional activity Standing balance-Leahy Scale: Poor Standing balance comment: Reliant on external support.                           ADL either performed or assessed with clinical judgement   ADL Overall ADL's : Needs assistance/impaired Eating/Feeding: Set up Eating/Feeding Details (indicate cue type and reason): with spills while drinking water Grooming: Set up;Sitting   Upper Body Bathing: Minimal assistance;Sitting   Lower Body Bathing: Moderate assistance;Sit to/from stand   Upper Body Dressing : Minimal assistance;Sitting   Lower Body Dressing: Maximal assistance;Sit to/from stand   Toilet Transfer: Rolling walker (2 wheels);Stand-pivot;Moderate assistance           Functional mobility during ADLs: Minimal assistance General ADL Comments: very unsteady; requires external support  Vision Baseline Vision/History: 1 Wears glasses Ability to See in Adequate Light: 0 Adequate Patient Visual Report: No change from  baseline Vision Assessment?: Vision impaired- to be further tested in functional context Additional Comments: able to read from board on wall. Pt denies new visual changes     Perception Perception: Impaired Preception Impairment Details: Spatial orientation     Praxis         Pertinent Vitals/Pain Pain Assessment Pain Assessment: No/denies pain     Extremity/Trunk Assessment Upper Extremity Assessment Upper Extremity Assessment: Generalized weakness;RUE deficits/detail RUE Deficits / Details: R wrist wrapped; per chart triquetrum fx RUE: Unable to fully assess due to immobilization RUE Coordination: decreased fine motor   Lower Extremity Assessment Lower Extremity Assessment: Defer to PT evaluation   Cervical / Trunk Assessment Cervical / Trunk Assessment: Kyphotic   Communication Communication Communication: Impaired Factors Affecting Communication: Reduced clarity of speech   Cognition Arousal: Alert Behavior During Therapy: WFL for tasks assessed/performed Cognition: Cognition impaired   Orientation impairments: Situation Awareness: Online awareness impaired   Attention impairment (select first level of impairment): Selective attention Executive functioning impairment (select all impairments): Reasoning, Problem solving                   Following commands: Impaired Following commands impaired: Follows one step commands with increased time     Cueing  General Comments   Cueing Techniques: Verbal cues;Tactile cues;Visual cues  min L inattention, but able to scan environment to locate items on L and R sides   Exercises     Shoulder Instructions      Home Living Family/patient expects to be discharged to:: Private residence Living Arrangements: Children;Other relatives Available Help at Discharge: Family;Available 24 hours/day Type of Home: House Home Access: Stairs to enter [employer] of Steps: 5 steps front entrance, 2 steps  garage entrance Entrance Stairs-Rails: Can reach both Home Layout: Two level;Able to live on main level with bedroom/bathroom;Bed/bath upstairs   Alternate Level Stairs-Rails: Left Bathroom Shower/Tub: Tub/shower unit;Sponge bathes at baseline (has been sponge bathing since AIR)   Bathroom Toilet: Handicapped height Bathroom Accessibility: Yes   Home Equipment: Cane - single point;Rolling Walker (2 wheels);Rollator (4 wheels);Wheelchair - manual   Additional Comments: lives with daughter and family  Lives With: Daughter;Family    Prior Functioning/Environment Prior Level of Function : Independent/Modified Independent;History of Falls (last six months)             Mobility Comments: walks with rollator, or uses w/c. Pt states he is mostly transfer-level at this time, and has daughter to assist with transfers and gait ADLs Comments: assist for all ADLs per pt report; later reports LB ADL and set up for additional ADL/grooming    OT Problem List: Decreased strength;Decreased range of motion;Impaired vision/perception;Impaired balance (sitting and/or standing);Decreased activity tolerance;Decreased coordination;Decreased cognition;Decreased safety awareness;Decreased knowledge of use of DME or AE;Decreased knowledge of precautions;Impaired UE functional use;Pain;Increased edema   OT Treatment/Interventions: Self-care/ADL training;Therapeutic exercise;Neuromuscular education;DME and/or AE instruction;Therapeutic activities;Cognitive remediation/compensation;Visual/perceptual remediation/compensation;Patient/family education;Balance training      OT Goals(Current goals can be found in the care plan section)   Acute Rehab OT Goals Patient Stated Goal: get better OT Goal Formulation: With patient/family Time For Goal Achievement: 10/21/23 Potential to Achieve Goals: Good   OT Frequency:  Min 2X/week    Co-evaluation              AM-PAC OT 6 Clicks Daily Activity      Outcome Measure Help from  another person eating meals?: A Little Help from another person taking care of personal grooming?: A Little Help from another person toileting, which includes using toliet, bedpan, or urinal?: A Lot Help from another person bathing (including washing, rinsing, drying)?: A Lot Help from another person to put on and taking off regular upper body clothing?: A Little Help from another person to put on and taking off regular lower body clothing?: A Lot 6 Click Score: 15   End of Session Equipment Utilized During Treatment: Gait belt;Rolling walker (2 wheels) Nurse Communication: Mobility status  Activity Tolerance: Patient tolerated treatment well Patient left: in chair;with call bell/phone within reach;with chair alarm set;with family/visitor present  OT Visit Diagnosis: Unsteadiness on feet (R26.81);Other abnormalities of gait and mobility (R26.89);Muscle weakness (generalized) (M62.81);History of falling (Z91.81);Ataxia, unspecified (R27.0);Other symptoms and signs involving cognitive function;Dizziness and giddiness (R42)                Time: 1610-9604 OT Time Calculation (min): 28 min Charges:  OT General Charges $OT Visit: 1 Visit OT Evaluation $OT Eval Moderate Complexity: 1 Mod OT Treatments $Self Care/Home Management : 8-22 mins  Karilyn Ouch, OTR/L [facility] Acute Rehabilitation Office: [phone]   Allen Dyer 10/07/2023, 4:59 PM

## 2023-10-07 NOTE — Evaluation (Signed)
 Physical Therapy Evaluation Patient Details Name: Allen Dyer MRN: [id number] DOB: [birth year] Today's Date: 10/07/2023  History of Present Illness  88 yo male presents to [facility] on 6/14 with facial paresthesias, breathing difficulty, difficulty ambulating. MRI shows acute R lateral pontine infarct. PMH significant for R>L cerebellar CVA 09/02/2023 with d/c from [facility] 6/6; CVA in bilat cerebellar and [employer] territories 2023 with residual R weakness and speech difficulty; chronic combined CHF; CAD; stage 4 CKD; HTN; HLD; multiple myeloma; prostate CA s/p XRT; and DM  Clinical Impression   Pt presents with LE weakness, incoordination, impaired activity tolerance, L inattention, and impaired balance. Pt to benefit from acute PT to address deficits. Pt transferring to/from bed with step pivot and RW today, pt requiring min-mod physical assist throughout. Pt states he is mostly transfer-level at home at this time, pt's daughter agrees via telephone, and both pt and daughter want pt to d/c home with HHPT ([facility] was set to start this coming week). PT to progress mobility as tolerated, and will continue to follow acutely.          If plan is discharge home, recommend the following: A lot of help with walking and/or transfers;A lot of help with bathing/dressing/bathroom;Assist for transportation;Help with stairs or ramp for entrance   Can travel by private vehicle    Would recommend ambulance home given stairs to enter     Equipment Recommendations None recommended by PT  Recommendations for Other Services       Functional Status Assessment Patient has had a recent decline in their functional status and demonstrates the ability to make significant improvements in function in a reasonable and predictable amount of time.     Precautions / Restrictions Precautions Precautions: Fall Recall of Precautions/Restrictions: Intact Precaution/Restrictions Comments: history of ataxia and urinary incontinence,  since previous CVA 08/2023 Required Braces or Orthoses: Splint/Cast Splint/Cast: r wrist removable cast for triquetrium fracture Restrictions Weight Bearing Restrictions Per Provider Order: No RUE Weight Bearing Per Provider Order: Weight bearing as tolerated (as of 1 month ago)      Mobility  Bed Mobility Overal bed mobility: Needs Assistance Bed Mobility: Supine to Sit, Sit to Supine     Supine to sit: Mod assist, Used rails Sit to supine: Mod assist, Used rails   General bed mobility comments: assist for LE progression to/from EOB, trunk management, boost up in bed with assist of trendelenburg and bed pads    Transfers Overall transfer level: Needs assistance Equipment used: Rolling walker (2 wheels) Transfers: Sit to/from Stand, Bed to chair/wheelchair/BSC Sit to Stand: Mod assist   Step pivot transfers: Min assist       General transfer comment: assist for power up, rise, steady. Stand x2, pivot to/from recliner with assist to steady and manage RW.    Ambulation/Gait               General Gait Details: nt - pt states he is mostly transfer level at home at this time  Acupuncturist Bed    Modified Rankin (Stroke Patients Only) Modified Rankin (Stroke Patients Only) Pre-Morbid Rankin Score: Moderately severe disability Modified Rankin: Moderately severe disability     Balance Overall balance assessment: Needs assistance Sitting-balance support: No upper extremity supported, Feet supported Sitting balance-Leahy Scale: Fair     Standing balance support: Bilateral upper extremity supported, During functional activity Standing balance-Leahy Scale: Poor Standing balance comment: Reliant  on external support.                             Pertinent Vitals/Pain Pain Assessment Pain Assessment: No/denies pain    Home Living Family/patient expects to be discharged to:: Private residence Living  Arrangements: Children;Other relatives Available Help at Discharge: Family;Available 24 hours/day Type of Home: House Home Access: Stairs to enter Entrance Stairs-Rails: Can reach both Entrance Stairs-Number of Steps: 5 steps front entrance, 2 steps garage entrance   Home Layout: Two level;Able to live on main level with bedroom/bathroom;Bed/bath upstairs Home Equipment: Cane - single Librarian, academic (2 wheels);Rollator (4 wheels);Wheelchair - manual Additional Comments: lives with daughter and family    Prior Function Prior Level of Function : Independent/Modified Independent;History of Falls (last six months)             Mobility Comments: walks with rollator, or uses w/c. Pt states he is mostly transfer-level at this time, and has daughter to assist with transfers and gait ADLs Comments: assist for all ADLs per pt report     Extremity/Trunk Assessment   Upper Extremity Assessment Upper Extremity Assessment: Defer to OT evaluation    Lower Extremity Assessment Lower Extremity Assessment: Generalized weakness;RLE deficits/detail;LLE deficits/detail RLE Deficits / Details: 4/5 knee extension, at least 3/5 hip flexion, 4/5 knee flexion. limited by incoordination and + rebound RLE Coordination: decreased gross motor LLE Deficits / Details: grossly symmetric to L; 4/5 knee extension, at least 3/5 hip flexion, 4/5 knee flexion. limited by incoordination and + rebound LLE Coordination: decreased gross motor    Cervical / Trunk Assessment Cervical / Trunk Assessment: Kyphotic  Communication   Communication Communication: Impaired Factors Affecting Communication: Reduced clarity of speech    Cognition Arousal: Alert Behavior During Therapy: WFL for tasks assessed/performed   PT - Cognitive impairments: Attention, Problem solving, Safety/Judgement, Initiation, Sequencing                       PT - Cognition Comments: A&Ox4, pleasant. Slowed processing and  dysarthria noted. step by step sequencing cues for mobility Following commands: Impaired Following commands impaired: Follows one step commands with increased time     Cueing Cueing Techniques: Verbal cues, Tactile cues, Visual cues     General Comments General comments (skin integrity, edema, etc.): min L inattention, but able to orient to L with verbal cues and talking to pt on L    Exercises     Assessment/Plan    PT Assessment Patient needs continued PT services  PT Problem List Decreased strength;Decreased activity tolerance;Decreased range of motion;Decreased balance;Decreased mobility;Decreased coordination;Decreased cognition;Decreased knowledge of use of DME;Decreased safety awareness;Decreased knowledge of precautions;Cardiopulmonary status limiting activity       PT Treatment Interventions DME instruction;Gait training;Functional mobility training;Stair training;Therapeutic exercise;Therapeutic activities;Neuromuscular re-education;Balance training;Patient/family education    PT Goals (Current goals can be found in the Care Plan section)  Acute Rehab PT Goals Patient Stated Goal: go home PT Goal Formulation: With patient Time For Goal Achievement: 10/21/23 Potential to Achieve Goals: Good    Frequency Min 3X/week     Co-evaluation               AM-PAC PT 6 Clicks Mobility  Outcome Measure Help needed turning from your back to your side while in a flat bed without using bedrails?: A Little Help needed moving from lying on your back to sitting on the side of a flat bed without  using bedrails?: A Lot Help needed moving to and from a bed to a chair (including a wheelchair)?: A Lot Help needed standing up from a chair using your arms (e.g., wheelchair or bedside chair)?: A Lot Help needed to walk in hospital room?: A Lot Help needed climbing 3-5 steps with a railing? : A Lot 6 Click Score: 13    End of Session Equipment Utilized During Treatment: Gait  belt Activity Tolerance: Patient tolerated treatment well Patient left: in bed;with call bell/phone within reach;with bed alarm set Nurse Communication: Mobility status PT Visit Diagnosis: Other abnormalities of gait and mobility (R26.89);Repeated falls (R29.6)    Time: 1610-9604 PT Time Calculation (min) (ACUTE ONLY): 26 min   Charges:   PT Evaluation $PT Eval Low Complexity: 1 Low PT Treatments $Therapeutic Activity: 8-22 mins PT General Charges $$ ACUTE PT VISIT: 1 Visit         Shirlene Doughty, PT DPT Acute Rehabilitation Services Secure Chat Preferred  Office [phone]   Dink Creps E Burnadette Carrion 10/07/2023, 11:47 AM

## 2023-10-07 NOTE — Progress Notes (Addendum)
 STROKE TEAM PROGRESS NOTE   INTERIM HISTORY/SUBJECTIVE  No family at the bedside. Patient is awake and alert laying in bed in NAD MR with acute right posterolateral pons.   CBC    Component Value Date/Time   WBC 4.9 10/07/2023 0659   RBC 3.56 (L) 10/07/2023 0659   HGB 11.5 (L) 10/07/2023 0659   HGB 11.0 (L) 10/10/2018 1404   HGB 11.0 (L) 06/14/2018 1222   HCT 35.4 (L) 10/07/2023 0659   HCT 33.9 (L) 06/14/2018 1222   PLT 135 (L) 10/07/2023 0659   PLT 176 10/10/2018 1404   PLT 214 06/14/2018 1222   MCV 99.4 10/07/2023 0659   MCV 92 06/14/2018 1222   MCH 32.3 10/07/2023 0659   MCHC 32.5 10/07/2023 0659   RDW 13.3 10/07/2023 0659   RDW 14.7 06/14/2018 1222   LYMPHSABS 0.8 10/06/2023 0612   MONOABS 0.5 10/06/2023 0612   EOSABS 0.1 10/06/2023 0612   BASOSABS 0.0 10/06/2023 0612    BMET    Component Value Date/Time   NA 142 10/07/2023 0659   NA 143 07/01/2018 1233   K 3.5 10/07/2023 0659   CL 103 10/07/2023 0659   CO2 26 10/07/2023 0659   GLUCOSE 113 (H) 10/07/2023 0659   BUN 19 10/07/2023 0659   BUN 27 07/01/2018 1233   CREATININE 1.65 (H) 10/07/2023 0659   CREATININE 2.06 (H) 10/10/2018 1404   CALCIUM  10.0 10/07/2023 0659   GFRNONAA 40 (L) 10/07/2023 0659   GFRNONAA 29 (L) 10/10/2018 1404    IMAGING past 24 hours MR ANGIO HEAD WO CONTRAST Result Date: 10/06/2023 CLINICAL DATA:  Stroke, follow up. EXAM: MRA HEAD WITHOUT CONTRAST TECHNIQUE: Angiographic images of the Circle of Willis were acquired using MRA technique without intravenous contrast. COMPARISON:  CTA head and neck 09/02/2023 FINDINGS: Anterior circulation: The internal carotid arteries are patent from skull base to carotid termini with extensive atherosclerosis as shown on the prior CTA including grossly unchanged severe right and moderate left cavernous stenoses and mild right and severe left supraclinoid stenoses. 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  The left ACA is supplied by the anterior communicating artery with a fenestration again noted in the anterior communicating region. The right ACA is widely patent. Both MCAs are patent without evidence of a proximal branch occlusion. There are unchanged mild-to-moderate right M1 and no aneurysm is identified. Proximal left M2 stenoses. Posterior circulation: No flow related enhancement is evident in the included portions of the distal vertebral arteries or basilar artery with bilateral vertebral artery occlusion demonstrated on the prior CTA. There are patent, small posterior communicating arteries bilaterally, and the P2 segments are patent but small in caliber and irregular diffusely. No aneurysm is identified. Anatomic variants: None. IMPRESSION: 1. Known bilateral vertebral artery occlusions. Absent flow related enhancement in the basilar artery may reflect slow flow or interval occlusion. 2. Unchanged advanced anterior circulation atherosclerosis including severe right and moderate left ICA stenoses. Electronically Signed   By: Aundra Lee M.D.   On: 10/06/2023 17:12    Vitals:   10/06/23 1937 10/07/23 0004 10/07/23 0500 10/07/23 0810  BP: (!) 155/76 (!) 142/83 (!) 156/80 (!) 171/92  Pulse: 78 86 81 81  Resp: 18 18 16  (!) 22  Temp: 98.6 F (37 C) 98 F (36.7 C) 98.7 F (37.1 C) 97.8 F (36.6 C)  TempSrc: Oral  Oral Oral  SpO2: 99% 95% 94% 98%  Weight:  Height:         PHYSICAL EXAM General:  Alert, well-nourished, well-developed patient in no acute distress Psych:  Mood and affect appropriate for situation CV: Regular rate and rhythm on monitor Respiratory:  Regular, unlabored respirations on room air GI: Abdomen soft and nontender   NEURO:  Mental Status: AA&Ox3, patient is able to give clear and coherent history Speech/Language: speech is without aphasia.  Dysarthric speech. Naming, repetition, fluency, and comprehension intact.  Cranial Nerves:  II: PERRL. Visual fields  full.  III, IV, VI: EOMI. Eyelids elevate symmetrically.  V: Sensation is intact to light touch and symmetrical to face.  VII: left facial VIII: hearing intact to voice. IX, X: Palate elevates symmetrically. Phonation is normal.  XB:[id number] shrug 5/5. XII: tongue deviates to the left  Motor: 5/5 strength tin bilateral uppers, bilateral lowers 4+/5 Tone: is normal and bulk is normal Sensation- Intact to light touch bilaterally. Extinction absent to light touch to DSS.   Coordination: FTN intact bilaterally, HKS: no ataxia in BLE.No drift.  Gait- deferred  Most Recent NIH  1a Level of Conscious.:  1b LOC Questions:  1c LOC Commands:  2 Best Gaze:  3 Visual:  4 Facial Palsy: 1 5a Motor Arm - left:  5b Motor Arm - Right:  6a Motor Leg - Left: 1 6b Motor Leg - Right: 1 7 Limb Ataxia:  8 Sensory:  9 Best Language:  10 Dysarthria: 1 11 Extinct. and Inatten.:  TOTAL: 4   ASSESSMENT/PLAN  [name] is a 88 y.o. male with history of CHF, hypertension, hyperlipidemia, multiple strokes, last 5/25, GERD, prostate cancer, gout, multiple myeloma, CAD, diabetes, peripheral neuropathy and CKD stage IV who presents with difficulty breathing, difficulty ambulating and right facial numbness NIH on Admission 5  Acute Ischemic Infarct:  right pons Etiology:  small vessel disease   CT head Evolving right thalamic territory nonhemorrhagic infarct. Bilateral subacute cerebellar peduncle infarcts. MRI   Acute nonhemorrhagic infarct in the right posterolateral pons. No other acute infarcts. 2. Multiple remote lacunar infarcts in the cerebellum bilaterally, left paramedian pons, and posterior right basal ganglia. 3. Evolving signal changes in the cerebellar peduncles bilaterally are consistent with a now subacute ischemic event. 4. Remote cortical infarct in the right occipital pole. 5. Multiple punctate foci of remote hemorrhage in the left cerebellar peduncle. No acute hemorrhage. 6.  Mild atrophy and periventricular white matter change within normal limits MRA  Known bilateral vertebral artery occlusions. Unchanged advanced anterior circulation atherosclerosis including severe right and moderate left ICA stenoses. Carotid Doppler  ordered 2D Echo 09/03/2023: EF 25-30%. LV global hypokinesis, moderate concentric LVH with grade I diastolic dysfunction. Left atrium mildly dilated  LDL 73 HgbA1c 7.0 VTE prophylaxis - heparin  SQ aspirin  81 mg daily and clopidogrel  75 mg daily prior to admission, now on aspirin  81 mg daily and clopidogrel  75 mg daily for life Therapy recommendations:  Pending Disposition:  pending  Hx of Stroke/TIA  Multiple strokes on imaging  Continue ASA/plavix   Recommend  30 day heart monitor after discharge   Hypertension CAD Nonischemic cardiomyopathy  CHF Home meds:   coreg  3.125mg , Imdur  30mg , propanolol 10mg , torsemide  10mg ,  Stable Blood Pressure Goal: BP less than 220/110   Hyperlipidemia Home meds:  none LDL 73, goal < 70 Add crestor 10mg   Continue statin at discharge  Diabetes type II UnControlled Home meds:  glipizide  5mg   HgbA1c 7.0, goal < 7.0 CBGs SSI Recommend close follow-up with PCP for better DM  control  Dysphagia Patient has post-stroke dysphagia, SLP consulted    Diet   Diet NPO time specified   Advance diet as tolerated  Other Stroke Risk Factors  Obesity, Body mass index is 31.49 kg/m., BMI >/= 30 associated with increased stroke risk, recommend weight loss, diet and exercise as appropriate  Coronary artery disease Congestive heart failure   Other Active Problems CKD Peripheral neuropathy  BPH   Hospital day # 1   Jonette Nestle DNP, ACNPC-AG  [facility]   I have personally obtained history,examined this patient, reviewed notes, independently viewed imaging studies, participated in medical decision making and plan of care.ROS completed by me personally and pertinent positives fully  documented  I have made any additions or clarifications directly to the above note. Agree with note above. Spent a total of 40 min dedicated to the care of this patient.    Cassandra Cleveland, MD Neurology  10/07/2023 2:47 PM   To contact Stroke Continuity provider, please refer to [email or web address]. After hours, contact General Neurology

## 2023-10-07 NOTE — Plan of Care (Signed)
  Problem: Education: Goal: Knowledge of disease or condition will improve Outcome: Progressing   Problem: Ischemic Stroke/TIA Tissue Perfusion: Goal: Complications of ischemic stroke/TIA will be minimized Outcome: Progressing   Problem: Health Behavior/Discharge Planning: Goal: Goals will be collaboratively established with patient/family Outcome: Progressing   Problem: Self-Care: Goal: Ability to communicate needs accurately will improve Outcome: Progressing   Problem: Nutrition: Goal: Risk of aspiration will decrease Outcome: Progressing

## 2023-10-07 NOTE — Progress Notes (Signed)
 VASCULAR LAB    Carotid duplex has been performed.  See CV proc for preliminary results.   [name], RVT 10/07/2023, 12:16 PM

## 2023-10-08 ENCOUNTER — Observation Stay (HOSPITAL_COMMUNITY)

## 2023-10-08 DIAGNOSIS — E1165 Type 2 diabetes mellitus with hyperglycemia: Secondary | ICD-10-CM

## 2023-10-08 DIAGNOSIS — N189 Chronic kidney disease, unspecified: Secondary | ICD-10-CM

## 2023-10-08 DIAGNOSIS — I428 Other cardiomyopathies: Secondary | ICD-10-CM

## 2023-10-08 DIAGNOSIS — I2510 Atherosclerotic heart disease of native coronary artery without angina pectoris: Secondary | ICD-10-CM

## 2023-10-08 DIAGNOSIS — R29704 NIHSS score 4: Secondary | ICD-10-CM | POA: Diagnosis not present

## 2023-10-08 DIAGNOSIS — E1122 Type 2 diabetes mellitus with diabetic chronic kidney disease: Secondary | ICD-10-CM | POA: Diagnosis not present

## 2023-10-08 DIAGNOSIS — I509 Heart failure, unspecified: Secondary | ICD-10-CM | POA: Diagnosis not present

## 2023-10-08 DIAGNOSIS — I69391 Dysphagia following cerebral infarction: Secondary | ICD-10-CM

## 2023-10-08 DIAGNOSIS — I130 Hypertensive heart and chronic kidney disease with heart failure and stage 1 through stage 4 chronic kidney disease, or unspecified chronic kidney disease: Secondary | ICD-10-CM | POA: Diagnosis not present

## 2023-10-08 DIAGNOSIS — Z7401 Bed confinement status: Secondary | ICD-10-CM | POA: Diagnosis not present

## 2023-10-08 DIAGNOSIS — E1151 Type 2 diabetes mellitus with diabetic peripheral angiopathy without gangrene: Secondary | ICD-10-CM | POA: Diagnosis not present

## 2023-10-08 DIAGNOSIS — R4182 Altered mental status, unspecified: Secondary | ICD-10-CM | POA: Diagnosis not present

## 2023-10-08 DIAGNOSIS — I6389 Other cerebral infarction: Secondary | ICD-10-CM | POA: Diagnosis not present

## 2023-10-08 DIAGNOSIS — I639 Cerebral infarction, unspecified: Secondary | ICD-10-CM | POA: Diagnosis not present

## 2023-10-08 DIAGNOSIS — E785 Hyperlipidemia, unspecified: Secondary | ICD-10-CM | POA: Diagnosis not present

## 2023-10-08 NOTE — Procedures (Signed)
 Modified Barium Swallow Study  Patient Details  Name: Allen Dyer MRN: [id number] Date of Birth: 19-Dec-1935  Today's Date: 10/08/2023  Modified Barium Swallow completed.  Full report located under Chart Review in the Imaging Section.  History of Present Illness 88 yo male presents to [facility] on 6/14 with facial paresthesias, breathing difficulty, difficulty ambulating. MRI shows acute R lateral pontine infarct. MBS 5/21 showed aspiration of consecutive sips of thin liquids with cough, but no ejection. Pt drinking from a Provale cup upon d/c. PMH significant for R>L cerebellar CVA 09/02/2023 with d/c from [facility] 6/6; CVA in bilat cerebellar and [employer] territories 2023 with residual R weakness and speech difficulty; chronic combined CHF; CAD; stage 4 CKD; HTN; HLD; multiple myeloma; prostate CA s/p XRT; and DM   Clinical Impression  Patient presents with mild oropharyngeal dysphagia with a similar presentation to last MBS completed on 5/21. Oral phase is remarkable for R facial and oral weakness resulting in occasional anterior spillage. Patient with reduced lingual control/coordination resulting in consistent posterior spillage of thin liquids to the pyriform sinuses. Pharyngeal phase is remarkable for reduced BOT retraction and reduced pharyngeal stripping wave resulting in vallecular and pyriform sinus residuals, mainly cleared through spontaneous and/or cued double swallows. There was one notable instance of aspiration with thin liquids right below the level of the vocal cords, though visibility limited due to poor patient positioning. Aspiration event was during consecutive sips of thin liquids and likely due to mistiming and inefficiency. No other instances of penetration/aspiration present across all consistencies. Patient consumed pill whole in puree with no difficulty. Recommend continuation of D2/thin liquids with medications whole in puree. Patient should continue utilizing 5cc provale during  consumption of thin liquids due to pharyngeal residuals and mistiming of swallow with thin liquids.  DIGEST Swallow Severity Rating*  Safety: 1  Efficiency: 1  Overall Pharyngeal Swallow Severity: mild 1: mild; 2: moderate; 3: severe; 4: profound  *The Dynamic Imaging Grade of Swallowing Toxicity is standardized for the head and neck cancer population, however, demonstrates promising clinical applications across populations to standardize the clinical rating of pharyngeal swallow safety and severity.  Factors that may increase risk of adverse event in presence of aspiration [name] & [name] 2021): Weak cough;Frail or deconditioned;Limited mobility;Reduced cognitive function  Swallow Evaluation Recommendations Recommendations: PO diet PO Diet Recommendation: Dysphagia 2 (Finely chopped);Thin liquids (Level 0) Liquid Administration via: Other (Comment) (5cc provale) Medication Administration: Whole meds with puree Supervision: Full supervision/cueing for swallowing strategies Swallowing strategies  : Slow rate;Small bites/sips;Check for pocketing or oral holding Postural changes: Position pt fully upright for meals Oral care recommendations: Oral care BID (2x/day)     [name] M.A., CCC-SLP 10/08/2023,9:55 AM

## 2023-10-08 NOTE — Discharge Summary (Signed)
 Physician Discharge Summary  Cobi Delph NWG:[id number] DOB: 09/04/1935 DOA: 10/06/2023  PCP: Lanae Pinal, MD  Admit date: 10/06/2023 Discharge date: 10/08/2023  Time spent: 40 minutes  Recommendations for Outpatient Follow-up:  Follow CBC/CMP  Follow statin outpatient - holding on starting this now with allergy to simvastatin causing blistering rash, consider further evaluation outpatient SVT/atrial tach - discharging with event monitor Follow SLP recs - dysphagia 2 diet, thin liquids with provale, follow outpatient Glipizide  on hold at discharge, AM BG's in 90's and 110's here - risk of hypoglycemia with sulfonylureas - consider other agents outpatient   Discharge Diagnoses:  Principal Problem:   Acute CVA (cerebrovascular accident) (HCC) Active Problems:   Hyperlipidemia   Essential hypertension   NICM (nonischemic cardiomyopathy) (HCC)   Pulmonary hypertension (HCC)   Type 2 diabetes mellitus with vascular disease (HCC)   Smoldering multiple myeloma   Peripheral vascular disease (HCC)   Stroke [facility])   Discharge Condition: stable  Diet recommendation: heart healthy, diabetic  Filed Weights   10/06/23 0520  Weight: 91.2 kg    History of present illness:   [place] Espina  is Meloni Hinz 88 y.o. male,  with history of chronic combined heart failure, nonischemic cardiomyopathy known ejection fraction 35%, stroke, prostate cancer status postradiation therapy and BPH, GERD, multiple myeloma, nonobstructive coronary artery disease, hypertension hyperlipidemia and type 2 diabetes, CKD stage IV, patient with recent hospitalization secondary to acute CVA, discharged to CIR, and he was discharged home from [facility] 09/26/2023, patient presents to ED secondary to complaints of facial tingling, numbness, prompted him to come to ED.  He was found to have Shareka Casale new stroke as well as subacute strokes.  Seen by neurology recommending DAPT indefinitely, cardiac monitor outpatient.    Hospital Course:   Assessment and Plan:  Acute CVA - Patient with recent hospitalization for acute CVA, significant for bilateral cerebellar infarct, right more than left, to be secondary to large vessel disease from severe posterior circulation stenosis. - MRI with acute nonhemorrhagic infarct in the R posterolateral pons (remote lacunar infarcts in the cerebellum bilaterally, L paramedian pons, and posterior R basal ganglia - evolving signal changes in the cerebellar peduncles c/w subacute ischemic event) (see report) - MRA with known bilateral vertebral artery occlusions, advanced anterior circulation atherosclerosis including severe R and moderate L ICA stenoses   - carotid US  with near normal bilateral carotids.  Antegrade flow to bilateral vertebral arteries. - recent echo with EF 25-30%, grade 1 diastolic dysfunction (not repeated during this admission.  - appreciate neurology assistance, suspected due to small vessel disease - recommended DAPT indefinitely.  Plan for outpatient cardiac event monitor.   - now s/p MBS - SLP recommending dysphagia 2 diet, thins with provale  - A1c 7, LDL 73 ---- neurology started crestor, but allergy listed to simvastatin with blistering rash - will hold off on statin - would discuss outpatient further.     Diastolic CHF Cardiomyopathy -During recent hospitalization 2D echo significant for EF 25 to 30% . -will gradually resume antihypertensives    Hypertension  Gradually resume home meds  SVT Reviewed with cards, tele c/w Kerrianne Jeng tach.   He'll leave with an event monitor  Coreg     CKD stage IIIb At baseline, avoid nephrotoxic medications   BPH flomax    Diabetes mellitus, type II Hold glipizide  at discharge as BG's ok here and risk of hypoglycemia    OSA - Continue with CPAP  Obesity Body mass index is 31.49 kg/m.  Procedures: none   Consultations: neurology  Discharge Exam: Vitals:   10/08/23 0750 10/08/23 1213  BP: (!) 165/77 (!) 143/77   Pulse: 72 71  Resp: 20 20  Temp: 98.7 F (37.1 C) 98.7 F (37.1 C)  SpO2: 93% 94%   No new complaints Called to discuss d/c with daughter  General: No acute distress. Cardiovascular: RRR - tele reviewed with sinus Lungs: unlabored Neurological: alert and oriented.  Moving all extremities. Dysarthria, moving his mouth repeatedly.   Extremities: No clubbing or cyanosis. No edema.   Discharge Instructions   Discharge Instructions     Ambulatory referral to Neurology   Complete by: As directed    An appointment is requested in approximately: 2 weeks   DIET DYS 2   Complete by: As directed    Liquids via provale Whole meds with puree  Supervision: Full supervision/cueing for compensatory strategies Compensations: Minimize environmental distractions;Slow rate;Small sips/bites Postural Changes and/or Swallow Maneuvers: Seated upright 90 degrees   Fluid consistency: Thin   Discharge instructions   Complete by: As directed    You were seen with Josefine Fuhr recurrent stroke.    You've been seen by neurology who recommended continuing aspirin  and plavix  indefinitely.  You'll need to go home on Shelbee Apgar cardiac monitor (cardiology will mail this to you) to look for abnormal heart rhythms that would indicate Nikky Duba change in your medications.  If you don't hear from cardiology regarding the monitor, call Dr. Mitzie Anda or Dr. Micael Adas.   We considered starting crestor (Ashlee Bewley statin), but you had Jatziri Goffredo blistering rash with simvastatin.  You should follow up with your outpatient doctor regarding whether this is Derak Schurman good option for you.    You had Dorn Hartshorne modified barium swallow.  The speech therapist recommended Ivon Roedel dysphagia 2 diet with thin liquids with Shakenna Herrero provale cup.    You should follow up with neurology outpatient with regards to your strokes.  Your neck ultrasound showed near normal carotid arteries.  Your MRI brain showed significant atherosclerosis (plaque).   Resume your blood pressure medicines gradually.  Continue  coreg .  You can resume your lasix  today.  Tuesday, you can start your imdur .    We've stopped your propanolol for your tremor as this is the same class of medicine as coreg .  If you have continued issues with tremor, ask neurology about Morgan Rennert trial of another medicine.  I think you should stop your glipizide .  Your blood sugars were ok here and glipizide  will put you at risk for low blood sugars.   Keep the splint on until you arrange follow up with Dr. Margarite Shearer (call to arrange an appointment).   Return for new, recurrent, or worsening symptoms.   Please ask your PCP to request records from this hospitalization so they know what was done and what the next steps will be.   Increase activity slowly   Complete by: As directed       Allergies as of 10/08/2023       Reactions   Tramadol Other (See Comments)   Dizziness (intolerance) hallucinate   Finasteride Swelling   Breast enlargement   Jardiance  [empagliflozin ]    Yeast infection   Lisinopril Cough   Ciprofloxacin Other (See Comments)   Dizziness (intolerance)   Simvastatin Hives, Other (See Comments)   Blisters/ Rash   Sulfa Antibiotics Rash        Medication List     STOP taking these medications    glipiZIDE  5 MG tablet Commonly known as: GLUCOTROL   propranolol  10 MG tablet Commonly known as: INDERAL        TAKE these medications    acetaminophen  325 MG tablet Commonly known as: TYLENOL  Take 2 tablets (650 mg total) by mouth every 4 (four) hours as needed for mild pain (pain score 1-3) (or temp > 37.5 C (99.5 F)).   aspirin  EC 81 MG tablet Take 1 tablet (81 mg total) by mouth daily. Swallow whole.   carvedilol  3.125 MG tablet Commonly known as: COREG  Take 1 tablet (3.125 mg total) by mouth 2 (two) times daily with Caelin Rayl meal.   clopidogrel  75 MG tablet Commonly known as: PLAVIX  Take 1 tablet (75 mg total) by mouth daily.   cyanocobalamin  1000 MCG tablet Take 1 tablet (1,000 mcg total) by mouth every  morning.   diclofenac  Sodium 1 % Gel Commonly known as: VOLTAREN  Apply 2 g topically 4 (four) times daily. What changed:  when to take this reasons to take this   docusate sodium  100 MG capsule Commonly known as: COLACE Take 1 capsule (100 mg total) by mouth daily.   Fish Oil 1200 MG Caps Take 1,200 mg by mouth daily.   FREESTYLE LITE test strip Generic drug: glucose blood 1 each by Other route daily in the afternoon. Use as instructed   FreeStyle Lite w/Device Kit 1 Device by Does not apply route daily in the afternoon.   gabapentin  100 MG capsule Commonly known as: NEURONTIN  Take 1 capsule (100 mg total) by mouth at bedtime.   Garlic 1000 MG Caps Take 1,000 mg by mouth every morning.   isosorbide  mononitrate 30 MG 24 hr tablet Commonly known as: IMDUR  Take 0.5 tablets (15 mg total) by mouth every morning. Start taking on: October 09, 2023   meclizine  12.5 MG tablet Commonly known as: ANTIVERT  Take 1 tablet (12.5 mg total) by mouth daily as needed for dizziness.   modafinil  100 MG tablet Commonly known as: PROVIGIL  Take 1 tablet (100 mg total) by mouth daily.   Oyster Shell Calcium  500 MG Tabs Take 500 mg by mouth daily.   potassium chloride  SA 20 MEQ tablet Commonly known as: KLOR-CON  M Take 1 tablet (20 mEq total) by mouth daily.   scopolamine  1 MG/3DAYS Commonly known as: TRANSDERM-SCOP Place 1 patch (1.5 mg total) onto the skin every 3 (three) days.   tamsulosin  0.4 MG Caps capsule Commonly known as: FLOMAX  Take 1 capsule (0.4 mg total) by mouth 2 (two) times daily.   torsemide  10 MG tablet Commonly known as: DEMADEX  Take 3 tablets (30 mg total) by mouth daily.   vitamin C 1000 MG tablet Take 4,000 mg by mouth every morning.       Allergies  Allergen Reactions   Tramadol Other (See Comments)    Dizziness (intolerance) hallucinate   Finasteride Swelling    Breast enlargement    Jardiance  [Empagliflozin ]     Yeast infection   Lisinopril  Cough   Ciprofloxacin Other (See Comments)    Dizziness (intolerance)    Simvastatin Hives and Other (See Comments)    Blisters/ Rash    Sulfa Antibiotics Rash    Follow-up Information     Koirala, Dibas, MD Follow up.   Specialty: Family Medicine Contact information: [address] [place] [address] [phone]         Merrill Abide, MD Follow up.   Specialty: Orthopedic Surgery Why: call for follow up for your right arm Contact information: [address] [place]  [place] [address] [phone]  The results of significant diagnostics from this hospitalization (including imaging, microbiology, ancillary and laboratory) are listed below for reference.    Significant Diagnostic Studies: DG Swallowing Func-Speech Pathology Result Date: 10/08/2023 Table formatting from the original result was not included. Modified Barium Swallow Study Patient Details Name: Otniel Hoe MRN: [id number] Date of Birth: [birth year] Today's Date: 10/08/2023 HPI/PMH: HPI: 88 yo male [facility] with facial paresthesias, breathing difficulty, difficulty ambulating. MRI shows acute R lateral pontine infarct. MBS 5/21 showed aspiration of consecutive sips of thin liquids with cough, but no ejection. Pt drinking from Jaeshaun Riva Provale cup upon d/c. PMH significant for R>L cerebellar CVA 09/02/2023 with d/c from [facility] 6/6; CVA in bilat cerebellar and [employer] territories 2023 with residual R weakness and speech difficulty; chronic combined CHF; CAD; stage 4 CKD; HTN; HLD; multiple myeloma; prostate CA s/p XRT; and DM Clinical Impression: Patient presents with mild oropharyngeal dysphagia with Lakevia Perris similar presentation to last MBS completed on 5/21. Oral phase is remarkable for R facial and oral weakness resulting in occasional anterior spillage. Patient with reduced lingual control/coordination resulting in consistent posterior spillage of thin liquids to the pyriform  sinuses. Pharyngeal phase is remarkable for reduced BOT retraction and reduced pharyngeal stripping wave resulting in vallecular and pyriform sinus residuals, mainly cleared through spontaneous and/or cued double swallows. There was one notable instance of aspiration with thin liquids right below the level of the vocal cords, though visibility limited due to poor patient positioning. Aspiration event was during consecutive sips of thin liquids and likely due to mistiming and inefficiency. No other instances of penetration/aspiration present across all consistencies. Patient consumed pill whole in puree with no difficulty. Recommend continuation of D2/thin liquids with medications whole in puree. Patient should continue utilizing 5cc provale during consumption of thin liquids due to pharyngeal residuals and mistiming of swallow with thin liquids. DIGEST Swallow Severity Rating*  Safety: 1  Efficiency: 1  Overall Pharyngeal Swallow Severity: mild 1: mild; 2: moderate; 3: severe; 4: profound *The Dynamic Imaging Grade of Swallowing Toxicity is standardized for the head and neck cancer population, however, demonstrates promising clinical applications across populations to standardize the clinical rating of pharyngeal swallow safety and severity. Factors that may increase risk of adverse event in presence of aspiration [name] & [name] 2021): Factors that may increase risk of adverse event in presence of aspiration [name] & [name] 2021): Weak cough; Frail or deconditioned; Limited mobility; Reduced cognitive function Recommendations/Plan: Swallowing Evaluation Recommendations Swallowing Evaluation Recommendations Recommendations: PO diet PO Diet Recommendation: Dysphagia 2 (Finely chopped); Thin liquids (Level 0) Liquid Administration via: Other (Comment) (5cc provale) Medication Administration: Whole meds with puree Supervision: Full supervision/cueing for swallowing strategies Swallowing strategies  : Slow rate;  Small bites/sips; Check for pocketing or oral holding Postural changes: Position pt fully upright for meals Oral care recommendations: Oral care BID (2x/day) Treatment Plan Treatment Plan Treatment recommendations: Therapy as outlined in treatment plan below Follow-up recommendations: Outpatient SLP Functional status assessment: Patient has had Arali Somera recent decline in their functional status and demonstrates the ability to make significant improvements in function in Jebediah Macrae reasonable and predictable amount of time. Treatment frequency: Min 2x/week Treatment duration: 2 weeks Interventions: Aspiration precaution training; Oropharyngeal exercises; Compensatory techniques; Patient/family education; Diet toleration management by SLP; Trials of upgraded texture/liquids; Respiratory muscle strength training Recommendations Recommendations for follow up therapy are one component of Daeja Helderman multi-disciplinary discharge planning process, led by the attending physician.  Recommendations may be updated based on patient status, additional  functional criteria and insurance authorization. Assessment: Orofacial Exam: Orofacial Exam Oral Cavity: Oral Hygiene: WFL Oral Cavity - Dentition: Dentures, not available; Missing dentition; Poor condition Anatomy: Anatomy: WFL Boluses Administered: Boluses Administered Boluses Administered: Thin liquids (Level 0); Mildly thick liquids (Level 2, nectar thick); Puree; Solid  Oral Impairment Domain: Oral Impairment Domain Lip Closure: Escape progressing to mid-chin Tongue control during bolus hold: Cohesive bolus between tongue to palatal seal Bolus preparation/mastication: Slow prolonged chewing/mashing with complete recollection Bolus transport/lingual motion: Repetitive/disorganized tongue motion Oral residue: Residue collection on oral structures Location of oral residue : Tongue; Palate Initiation of pharyngeal swallow : Pyriform sinuses  Pharyngeal Impairment Domain: Pharyngeal Impairment Domain Soft  palate elevation: No bolus between soft palate (SP)/pharyngeal wall (PW) Laryngeal elevation: Complete superior movement of thyroid  cartilage with complete approximation of arytenoids to epiglottic petiole Anterior hyoid excursion: Complete anterior movement Epiglottic movement: Complete inversion Laryngeal vestibule closure: Incomplete, narrow column air/contrast in laryngeal vestibule Pharyngeal stripping wave : Present - diminished Pharyngeal contraction (Fermon Ureta/P view only): N/Joren Rehm Pharyngoesophageal segment opening: Complete distension and complete duration, no obstruction of flow Tongue base retraction: Narrow column of contrast or air between tongue base and PPW Pharyngeal residue: Collection of residue within or on pharyngeal structures Location of pharyngeal residue: Valleculae; Pyriform sinuses; Tongue base  Esophageal Impairment Domain: Esophageal Impairment Domain Esophageal clearance upright position: Complete clearance, esophageal coating Pill: Pill Consistency administered: Puree Puree: WFL Penetration/Aspiration Scale Score: Penetration/Aspiration Scale Score 1.  Material does not enter airway: Mildly thick liquids (Level 2, nectar thick); Puree; Solid; Pill 6.  Material enters airway, passes BELOW cords then ejected out: Thin liquids (Level 0) Compensatory Strategies: Compensatory Strategies Compensatory strategies: Yes Multiple swallows: Effective Effective Multiple Swallows: Thin liquid (Level 0) Chin tuck: Ineffective Ineffective Chin Tuck: Thin liquid (Level 0)   General Information: Caregiver present: No  Diet Prior to this Study: Dysphagia 2 (finely chopped); Thin liquids (Level 0)   No data recorded  Respiratory Status: WFL   Supplemental O2: None (Room air)   No data recorded Behavior/Cognition: Alert; Cooperative; Requires cueing Self-Feeding Abilities: Needs assist with self-feeding Baseline vocal quality/speech: Hypophonia/low volume Volitional Cough: Able to elicit Volitional Swallow: Able to  elicit Exam Limitations: Limited visibility (limited visibility of trachea during aspiration event due to positioning) Goal Planning: Prognosis for improved oropharyngeal function: Good Barriers to Reach Goals: Cognitive deficits No data recorded No data recorded Consulted and agree with results and recommendations: Patient Pain: Pain Assessment Pain Assessment: No/denies pain End of Session: Start Time:SLP Start Time (ACUTE ONLY): 0900 Stop Time: SLP Stop Time (ACUTE ONLY): 0925 Time Calculation:SLP Time Calculation (min) (ACUTE ONLY): 25 min Charges: SLP Evaluations $ SLP Speech Visit: 1 Visit SLP Evaluations $BSS Swallow: 1 Procedure $MBS Swallow: 1 Procedure SLP visit diagnosis: SLP Visit Diagnosis: Dysphagia, oropharyngeal phase (R13.12) Past Medical History: Past Medical History: Diagnosis Date  Chronic combined systolic and diastolic CHF (congestive heart failure) (HCC)   CKD (chronic kidney disease), stage III (HCC)   Hyperlipidemia   Hypertension   Lower extremity edema   Mild CAD   Loura Pitt. mild-mod by cath 05/2018.  Multiple myeloma (HCC) 11/15/2018  NICM (nonischemic cardiomyopathy) (HCC)   Peripheral neuropathy   Prostate cancer (HCC)   Status post XRT  Rheumatic fever   Stroke [facility])   Trifascicular block   Type 2 diabetes mellitus (HCC)   Vertigo  Past Surgical History: Past Surgical History: Procedure Laterality Date  RIGHT HEART CATH N/Jendaya Gossett 07/10/2018  Procedure: RIGHT HEART CATH;  Surgeon:  Darlis Eisenmenger, MD;  Location: [facility] INVASIVE CV LAB;  Service: Cardiovascular;  Laterality: N/Yaacov Koziol;  RIGHT/LEFT HEART CATH AND CORONARY ANGIOGRAPHY N/Ashlynd Michna 06/18/2018  Procedure: RIGHT/LEFT HEART CATH AND CORONARY ANGIOGRAPHY;  Surgeon: Millicent Ally, MD;  Location: [facility] INVASIVE CV LAB;  Service: Cardiovascular;  Laterality: N/Myquan Schaumburg; Cassidi F Sockwell 10/08/2023, 10:12 AM  VAS US  CAROTID (at [facility] and [facility] only) Result Date: 10/08/2023 Carotid Arterial Duplex Study Patient Name:  MUHSIN DORIS  Date of Exam:   10/07/2023 Medical Rec #:  [id number]      Accession #:    [id number] Date of Birth: [birth year]      Patient Gender: M Patient Age:   10 years Exam Location:  [facility] Procedure:      VAS US  CAROTID Referring Phys: Margart Shears DE LA TORRE --------------------------------------------------------------------------------  Indications:       CVA, Numbness and Weakness. Risk Factors:      Hypertension, hyperlipidemia, Diabetes, past history of                    smoking, coronary artery disease. Other Factors:     CHF, NICM, CKD IV, Smoldering multiple myeloma. Limitations        Today's exam was limited due to the body habitus of the                    patient and patient unable to keep head turned. Comparison Study:  Prior normal carotid duplex done 09/12/21. Normal CTA of neck                    done 09/02/23 Performing Technologist: Carleene Chase RVS  Examination Guidelines: Fayelynn Distel complete evaluation includes B-mode imaging, spectral Doppler, color Doppler, and power Doppler as needed of all accessible portions of each vessel. Bilateral testing is considered an integral part of Shellby Schlink complete examination. Limited examinations for reoccurring indications may be performed as noted.  Right Carotid Findings: +----------+--------+--------+--------+------------------+------------------+           PSV cm/sEDV cm/sStenosisPlaque DescriptionComments           +----------+--------+--------+--------+------------------+------------------+ CCA Prox  76      7                                 intimal thickening +----------+--------+--------+--------+------------------+------------------+ CCA Distal80      19                                intimal thickening +----------+--------+--------+--------+------------------+------------------+ ICA Prox  52      23                                                   +----------+--------+--------+--------+------------------+------------------+ ICA Mid   55      21                                                    +----------+--------+--------+--------+------------------+------------------+ ICA Distal31      14  tortuous           +----------+--------+--------+--------+------------------+------------------+ ECA       37      2                                                    +----------+--------+--------+--------+------------------+------------------+ +----------+--------+-------+--------+-------------------+           PSV cm/sEDV cmsDescribeArm Pressure (mmHG) +----------+--------+-------+--------+-------------------+ [id number]                                         +----------+--------+-------+--------+-------------------+ +---------+--------+--+--------+-+ VertebralPSV cm/s25EDV cm/s8 +---------+--------+--+--------+-+  Left Carotid Findings: +----------+--------+--------+--------+------------------+------------------+           PSV cm/sEDV cm/sStenosisPlaque DescriptionComments           +----------+--------+--------+--------+------------------+------------------+ CCA Prox  79      9                                 intimal thickening +----------+--------+--------+--------+------------------+------------------+ CCA Distal55      9                                 intimal thickening +----------+--------+--------+--------+------------------+------------------+ ICA Prox  59      17                                tortuous           +----------+--------+--------+--------+------------------+------------------+ ICA Mid   45      17                                tortuous           +----------+--------+--------+--------+------------------+------------------+ ICA Distal42      12                                tortuous           +----------+--------+--------+--------+------------------+------------------+ ECA       27                                        tortuous            +----------+--------+--------+--------+------------------+------------------+ +----------+--------+--------+--------+-------------------+           PSV cm/sEDV cm/sDescribeArm Pressure (mmHG) +----------+--------+--------+--------+-------------------+ [id number]                                          +----------+--------+--------+--------+-------------------+   Summary: Right Carotid: The extracranial vessels were near-normal with only minimal wall                thickening or plaque. Left Carotid: The extracranial vessels were near-normal with only minimal wall               thickening or plaque. Vertebrals:  Bilateral vertebral arteries demonstrate antegrade flow. Subclavians: Normal flow hemodynamics were seen in bilateral subclavian              arteries. *See table(s) above for measurements and observations.  Electronically signed by Ardella Beaver MD on 10/08/2023 at 7:46:27 AM.    Final    MR ANGIO HEAD WO CONTRAST Result Date: 10/06/2023 CLINICAL DATA:  Stroke, follow up. EXAM: MRA HEAD WITHOUT CONTRAST TECHNIQUE: Angiographic images of the Circle of Willis were acquired using MRA technique without intravenous contrast. COMPARISON:  CTA head and neck 09/02/2023 FINDINGS: Anterior circulation: The internal carotid arteries are patent from skull base to carotid termini with extensive atherosclerosis as shown on the prior CTA including grossly unchanged severe right and moderate left cavernous stenoses and mild right and severe left supraclinoid stenoses. 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 The left ACA is supplied by the anterior communicating artery with Jamee Pacholski fenestration again noted in the anterior communicating region. The right ACA is widely patent. Both MCAs are patent without evidence of Ary Lavine proximal branch occlusion. There are unchanged mild-to-moderate right M1 and no aneurysm is identified. Proximal left M2 stenoses. Posterior  circulation: No flow related enhancement is evident in the included portions of the distal vertebral arteries or basilar artery with bilateral vertebral artery occlusion demonstrated on the prior CTA. There are patent, small posterior communicating arteries bilaterally, and the P2 segments are patent but small in caliber and irregular diffusely. No aneurysm is identified. Anatomic variants: None. IMPRESSION: 1. Known bilateral vertebral artery occlusions. Absent flow related enhancement in the basilar artery may reflect slow flow or interval occlusion. 2. Unchanged advanced anterior circulation atherosclerosis including severe right and moderate left ICA stenoses. Electronically Signed   By: Aundra Lee M.D.   On: 10/06/2023 17:12   MR BRAIN WO CONTRAST Result Date: 10/06/2023 EXAM: MRI BRAIN WITHOUT CONTRAST 10/06/2023 08:59:34 AM TECHNIQUE: Multiplanar multisequence MRI of the head/brain was performed without the administration of intravenous contrast. COMPARISON: MR head without contrast 09/11/2023. CLINICAL HISTORY: Neuro deficit, acute, stroke suspected. FINDINGS: BRAIN AND VENTRICLES: An acute nonhemorrhagic infarct is present along the posterolateral aspect of the right pons. Previously seen restricted diffusion within the cerebellar peduncles bilaterally has near completely resolved. T2 changes are present. No other acute infarcts are present. Multiple remote lacunar infarcts are present in the cerebellum bilaterally. Left paramedian pontine remote infarct is again noted. Multiple punctate foci of remote hemorrhage is now present in the left cerebellar peduncle. No acute hemorrhage is present. Yana Schorr remote nonhemorrhagic lacunar infarct is again noted in the posterior right basal ganglia. Mild atrophy and periventricular white matter change are otherwise within normal limits for age. Apurva Reily remote cortical infarct is present in the right occipital pole. ORBITS: Bilateral lens replacements are noted. The globes  and orbits are otherwise within normal limits. SINUSES AND MASTOIDS: Jamisen Duerson small right mastoid effusion is present. BONES AND SOFT TISSUES: Normal marrow signal. No acute soft tissue abnormality. IMPRESSION: 1. Acute nonhemorrhagic infarct in the right posterolateral pons. No other acute infarcts. 2. Multiple remote lacunar infarcts in the cerebellum bilaterally, left paramedian pons, and posterior right basal ganglia. 3. Evolving signal changes in the cerebellar peduncles bilaterally are consistent with Chanler Mendonca now subacute ischemic event. 4. Remote cortical infarct in the right occipital pole. 5. Multiple punctate foci of remote hemorrhage in the left cerebellar peduncle. No acute hemorrhage. 6. Mild atrophy and periventricular white matter change within normal limits for age.  Electronically signed by: Audree Leas MD 10/06/2023 09:18 AM EDT RP Workstation: [id number]   CT Head Wo Contrast Result Date: 10/06/2023 EXAM: CT HEAD WITHOUT CONTRAST 10/06/2023 06:40:44 AM TECHNIQUE: CT of the head was performed without the administration of intravenous contrast. Automated exposure control, iterative reconstruction, and/or weight based adjustment of the mA/kV was utilized to reduce the radiation dose to as low as reasonably achievable. COMPARISON: CT head without contrast 09/19/2023. MR head without contrast 09/11/2023. CLINICAL HISTORY: Numbness around the lips and left hand. Last known well at 11:00 pm last night. FINDINGS: BRAIN AND VENTRICLES: Evolving right PICA territory nonhemorrhagic infarct is noted. Bilateral subacute cerebellar peduncle infarcts are again noted. No acute hemorrhage is present. More remote lacunar infarcts are present in the cerebellum bilaterally. No acute supratentorial infarcts are present. ORBITS: Bilateral lens replacements are noted. The globes and orbits are otherwise within normal limits. SINUSES: The visualized paranasal sinuses and mastoid air cells demonstrate no acute abnormality.  SOFT TISSUES AND SKULL: No acute abnormality of the visualized skull or soft tissues. VASCULATURE: Atherosclerotic calcifications are present in the cavernous carotid arteries bilaterally and at the dural margin of both vertebral arteries. No hyperdense vessel is present. IMPRESSION: 1. Evolving right thalamic territory nonhemorrhagic infarct. 2. Bilateral subacute cerebellar peduncle infarcts. 3. No acute hemorrhage. No acute supratentorial infarct. Electronically signed by: Audree Leas MD 10/06/2023 06:52 AM EDT RP Workstation: [id number]   DG Chest Portable 1 View Result Date: 10/06/2023 CLINICAL DATA:  Mental status changes. EXAM: PORTABLE CHEST 1 VIEW COMPARISON:  09/23/2021 FINDINGS: The lungs are clear without focal pneumonia, edema, pneumothorax or pleural effusion. The cardio pericardial silhouette is enlarged. No acute bony abnormality. Telemetry leads overlie the chest. IMPRESSION: Enlargement of the cardiopericardial silhouette without acute cardiopulmonary findings. Electronically Signed   By: Donnal Fusi M.D.   On: 10/06/2023 06:33   CT HEAD WO CONTRAST ( ) Result Date: 09/19/2023 CLINICAL DATA:  88 year old male with vision loss. Recent cerebellar infarcts with abnormal signal in the cerebellar peduncles. EXAM: CT HEAD WITHOUT CONTRAST TECHNIQUE: Contiguous axial images were obtained from the base of the skull through the vertex without intravenous contrast. RADIATION DOSE REDUCTION: This exam was performed according to the departmental dose-optimization program which includes automated exposure control, adjustment of the mA and/or kV according to patient size and/or use of iterative reconstruction technique. COMPARISON:  Head CT 09/17/2023 and earlier. FINDINGS: Brain: Hypodense scattered bilateral cerebellar infarcts, and confluent hypodensity again noted in the bilateral cerebellar peduncles. No hemorrhagic transformation. No posterior fossa mass effect. Stable CT appearance from  2 days ago. No superimposed midline shift, ventriculomegaly, mass effect, evidence of mass lesion, intracranial hemorrhage or new cortically based infarct. Bilateral PCA territory and optic radiation region gray-white differentiation appears stable and normal except for subtle chronic right occipital pole encephalomalacia which is more apparent on MRI. Vascular: Extensive Calcified atherosclerosis at the skull base. No suspicious intracranial vascular hyperdensity. Skull: Intact, stable. Sinuses/Orbits: Visualized paranasal sinuses and mastoids are clear. Other: Stable postoperative appearance of the globes. No acute orbit or scalp soft tissue finding identified. IMPRESSION: 1. Stable CT appearance of bilateral cerebellar infarcts and abnormal middle cerebellar peduncles as seen on MRI. No hemorrhagic transformation or mass effect. 2. No new intracranial abnormality. Small area of chronic right occipital pole encephalomalacia better demonstrated by MRI. Electronically Signed   By: Marlise Simpers M.D.   On: 09/19/2023 11:48   CT HEAD WO CONTRAST ( ) Result Date: 09/17/2023 CLINICAL DATA:  Stroke follow-up EXAM: CT HEAD WITHOUT  CONTRAST TECHNIQUE: Contiguous axial images were obtained from the base of the skull through the vertex without intravenous contrast. RADIATION DOSE REDUCTION: This exam was performed according to the departmental dose-optimization program which includes automated exposure control, adjustment of the mA and/or kV according to patient size and/or use of iterative reconstruction technique. COMPARISON:  09/11/2023 FINDINGS: Brain: Unchanged hypoattenuation within the middle cerebellar peduncles. 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 No mass, hemorrhage or extra-axial collection. Vascular: Atherosclerotic calcification of the internal carotid arteries at the skull base. No abnormal hyperdensity of the major intracranial arteries or dural venous sinuses. Skull: The visualized skull base,  calvarium and extracranial soft tissues are normal. Sinuses/Orbits: No fluid levels or advanced mucosal thickening of the visualized paranasal sinuses. No mastoid or middle ear effusion. Normal orbits. Other: None. IMPRESSION: 1. Unchanged hypoattenuation within the middle cerebellar peduncles, corresponding to areas of diffusion restriction previously seen. 2. 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 Electronically Signed   By: Juanetta Nordmann M.D.   On: 09/17/2023 20:00   DG Swallowing Func-Speech Pathology Result Date: 09/12/2023 Table formatting from the original result was not included. Modified Barium Swallow Study Patient Details Name: Javonne Dorko MRN: [id number] Date of Birth: 1936/03/27 Today's Date: 09/12/2023 HPI/PMH: HPI: Denarius Sesler is an 88 year old right-handed male with history significant for chronic combined systolic and diastolic heart failure, nonischemic cardiomyopathy, CVA, prostate cancer status post XRT/BPH, GERD, gout, multiple myeloma, nonobstructive CAD, hyperlipidemia, hypertension, diabetes mellitus with peripheral neuropathy and CKD stage IV. Presented 09/02/2023 with progressive unsteady gait times several weeks as well as falls x 2.  MRI showed patchy early subacute ischemic infarcts involving the right greater than left cerebellar hemispheres.  Associated minimal petechial blood products of the right cerebellum without hemorrhagic transformation. 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  Irregular reconstitution of the distal V2 segment of the right vertebral artery with occlusion of the right V3 and V4 segments.  Left vertebral artery occluded at the V2 segment from the level of C4-5.  CT cervical spine showed hypodense lesions in the cerebellar hemispheres, measuring 3.4 x 3.2 cm on the right 0.8 cm and 1.8 x 1.1 cm on the left. Was tolerating Lawren Sexson regular consistency diet until 09/07/23 when patient developed significantly worsening motor speech and began coughing  with thin liquids. Clinical Impression: Clinical Impression: Patient presents with mild oropharyngeal dysphagia primarily characterized by reductions to pharyngeal efficiency and safety d/t mistiming of the swallow with thin liquids. Orally, patient demonstrates weakness/numbness in the right cheek and lips resulting in anterior spillage with all liquids administered, escaping to mid chin. Pharyngeally, patient demosntrated reduction in base of tongue retraction to the posterior pharyngeal wall and reduced pharyngeal stripping wave resulting in residue in the vallecula and pyriform sinuses which mostly cleared with subsequent swallows. Once instance of aspiration was observed with consecutive cup sips of thin liquids, however suspect this is happening intermittently throughout the day given cough response after consecutive sips at bedside throughout evaluation and reports from daughter/nursing. Aspiration was secondary to mistiming and inefficiency as bolus material fell to the pyriform sinuses before the swallow, and material from the initial swallow was not fully cleared prior to next swallow resulting in overflow and resultant spillage into the trachea. Cough response was activated but was not effective. Notably at bedside, patient has been noted to exhibit intermittent instances of oral holding and residue though not observed on today's study, thus recommend Dys3/thin liquids utilizing SINGLE SIPS strategy with liquid. NO STRAWS as patient demonstrated reduced  cognitive ability to implement single sip strategies when straw was utilized. Administer medications whole in puree for ease. SLP will continue to follow. Factors that may increase risk of adverse event in presence of aspiration [name] & [name] 2021): Factors that may increase risk of adverse event in presence of aspiration [name] & [name] 2021): Weak cough; Frail or deconditioned; Limited mobility; Reduced cognitive function Recommendations/Plan:  Swallowing Evaluation Recommendations Swallowing Evaluation Recommendations Recommendations: PO diet PO Diet Recommendation: Dysphagia 3 (Mechanical soft); Thin liquids (Level 0) Liquid Administration via: Cup; No straw Medication Administration: Whole meds with puree Supervision: Full supervision/cueing for swallowing strategies Swallowing strategies  : Slow rate; Small bites/sips; Check for pocketing or oral holding Postural changes: Position pt fully upright for meals Oral care recommendations: Oral care BID (2x/day) Treatment Plan Treatment Plan Treatment recommendations: Therapy as outlined in treatment plan below Follow-up recommendations: Outpatient SLP Functional status assessment: Patient has had Omar Gayden recent decline in their functional status and demonstrates the ability to make significant improvements in function in Kristina Bertone reasonable and predictable amount of time. Treatment frequency: Min 2x/week Treatment duration: 2 weeks Interventions: Aspiration precaution training; Oropharyngeal exercises; Compensatory techniques; Patient/family education; Diet toleration management by SLP; Trials of upgraded texture/liquids; Respiratory muscle strength training Recommendations Recommendations for follow up therapy are one component of [place] multi-disciplinary discharge planning process, led by the attending physician.  Recommendations may be updated based on patient status, additional functional criteria and insurance authorization. Assessment: Orofacial Exam: Orofacial Exam Oral Cavity: Oral Hygiene: WFL Oral Cavity - Dentition: Dentures, not available; Missing dentition; Poor condition Anatomy: Anatomy: WFL Boluses Administered: Boluses Administered Boluses Administered: Thin liquids (Level 0); Mildly thick liquids (Level 2, nectar thick); Moderately thick liquids (Level 3, honey thick); Puree; Solid  Oral Impairment Domain: Oral Impairment Domain Lip Closure: Escape progressing to mid-chin Tongue control during bolus hold:  Cohesive bolus between tongue to palatal seal Bolus preparation/mastication: Timely and efficient chewing and mashing Bolus transport/lingual motion: Brisk tongue motion Oral residue: Residue collection on oral structures Location of oral residue : Tongue; Palate Initiation of pharyngeal swallow : Pyriform sinuses  Pharyngeal Impairment Domain: Pharyngeal Impairment Domain Soft palate elevation: No bolus between soft palate (SP)/pharyngeal wall (PW) Laryngeal elevation: Complete superior movement of thyroid  cartilage with complete approximation of arytenoids to epiglottic petiole Anterior hyoid excursion: Complete anterior movement Epiglottic movement: Complete inversion Laryngeal vestibule closure: Incomplete, narrow column air/contrast in laryngeal vestibule Pharyngeal stripping wave : Present - diminished Pharyngeal contraction (Elisabet Gutzmer/P view only): N/Cung Masterson Pharyngoesophageal segment opening: Complete distension and complete duration, no obstruction of flow Tongue base retraction: Narrow column of contrast or air between tongue base and PPW Pharyngeal residue: Collection of residue within or on pharyngeal structures Location of pharyngeal residue: Valleculae; Pyriform sinuses  Esophageal Impairment Domain: Esophageal Impairment Domain Esophageal clearance upright position: Complete clearance, esophageal coating Pill: Pill Consistency administered: Thin liquids (Level 0) Thin liquids (Level 0): [facility] Penetration/Aspiration Scale Score: Penetration/Aspiration Scale Score 1.  Material does not enter airway: Mildly thick liquids (Level 2, nectar thick); Moderately thick liquids (Level 3, honey thick); Puree; Solid; Pill 7.  Material enters airway, passes BELOW cords and not ejected out despite cough attempt by patient: Thin liquids (Level 0) Compensatory Strategies: Compensatory Strategies Compensatory strategies: Yes Other(comment): Effective (single sips) Effective Other(comment): Thin liquid (Level 0)   General Information:  Caregiver present: No  Diet Prior to this Study: Regular; Thin liquids (Level 0)   Temperature : Normal   Respiratory Status: WFL   Supplemental O2: None (Room  air)   History of Recent Intubation: No  Behavior/Cognition: Alert; Cooperative; Requires cueing Self-Feeding Abilities: Needs assist with self-feeding Baseline vocal quality/speech: Hypophonia/low volume Volitional Cough: Able to elicit Volitional Swallow: Able to elicit Exam Limitations: No limitations Goal Planning: Prognosis for improved oropharyngeal function: Good Barriers to Reach Goals: Cognitive deficits No data recorded No data recorded Consulted and agree with results and recommendations: Patient Pain: Pain Assessment Pain Assessment: No/denies pain End of Session: Start Time:No data recorded Stop Time: No data recorded Time Calculation:No data recorded Charges: No data recorded SLP visit diagnosis: SLP Visit Diagnosis: Dysphagia, oropharyngeal phase (R13.12) Past Medical History: Past Medical History: Diagnosis Date  Chronic combined systolic and diastolic CHF (congestive heart failure) (HCC)   CKD (chronic kidney disease), stage III (HCC)   Hyperlipidemia   Hypertension   Lower extremity edema   Mild CAD   Milika Ventress. mild-mod by cath 05/2018.  Multiple myeloma (HCC) 11/15/2018  NICM (nonischemic cardiomyopathy) (HCC)   Peripheral neuropathy   Prostate cancer (HCC)   Status post XRT  Rheumatic fever   Stroke (HCC)   Trifascicular block   Type 2 diabetes mellitus (HCC)   Vertigo  Past Surgical History: Past Surgical History: Procedure Laterality Date  RIGHT HEART CATH N/Dominic Rhome 07/10/2018  Procedure: RIGHT HEART CATH;  Surgeon: Darlis Eisenmenger, MD;  [facility] INVASIVE CV LAB;  Service: Cardiovascular;  Laterality: N/Miho Monda;  RIGHT/LEFT HEART CATH AND CORONARY ANGIOGRAPHY N/Tristin Gladman 06/18/2018  Procedure: RIGHT/LEFT HEART CATH AND CORONARY ANGIOGRAPHY;  Surgeon: Millicent Ally, MD;  Location: [facility] INVASIVE CV LAB;  Service: Cardiovascular;  Laterality: N/Mizuki Hoel; Ashley Sallyann Kinnaird Ellin  09/12/2023, 9:51 AM  MR BRAIN WO CONTRAST Result Date: 09/12/2023 CLINICAL DATA:  Initial evaluation for acute altered mental status. EXAM: MRI HEAD WITHOUT CONTRAST TECHNIQUE: Multiplanar, multiecho pulse sequences of the brain and surrounding structures were obtained without intravenous contrast. COMPARISON:  CT from earlier the same day as well as MRI from 09/08/2023 FINDINGS: Brain: Age-related cerebral atrophy with mild chronic microvascular ischemic disease. Multiple scattered remote cerebellar infarcts. Remote lacunar infarct at the posterior right lentiform nucleus. Evolving subacute infarct within the right cerebellum with mild residual diffusion signal abnormality, slightly less prominent from prior. Additional well-circumscribed foci of restricted diffusion involving the middle cerebellar peduncles again seen, similar to prior, and could reflect ischemic infarcts and/or evolving changes of wallerian degeneration as previously noted. No associated hemorrhage or significant regional mass effect. No other evidence for acute or interval infarction. Gray-white matter differentiation otherwise maintained. No acute or chronic intracranial blood products. No mass lesion, midline shift or mass effect. No hydrocephalus or extra-axial fluid collection. Pituitary gland and suprasellar region within normal limits. Vascular: Irregular flow voids throughout the vertebrobasilar system, similar to prior, and better evaluated on prior CTA. Anterior arterial intracranial flow voids are well maintained. Skull and upper cervical spine: Craniocervical junction within normal limits. Bone marrow signal intensity within normal limits. No scalp soft tissue abnormality. Sinuses/Orbits: Prior bilateral ocular lens replacement. Paranasal sinuses remain largely clear. Trace right mastoid effusion, of doubtful significance. Other: None. IMPRESSION: 1. Persistent foci of restricted diffusion involving the middle cerebellar peduncles,  similar to prior, and could reflect ischemic infarcts and/or evolving changes of Wallerian degeneration as previously noted. 2. Evolving subacute infarct within the right cerebellum, slightly less prominent as compared to prior. No associated hemorrhage or significant regional mass effect. 3. No other new acute intracranial abnormality. 4. Underlying age-related cerebral atrophy with mild chronic microvascular ischemic disease, with multiple remote cerebellar and right  basal ganglia infarcts. Electronically Signed   By: Virgia Griffins M.D.   On: 09/12/2023 02:11   CT HEAD WO CONTRAST ( ) Result Date: 09/11/2023 CLINICAL DATA:  Initial evaluation for acute altered mental status. EXAM: CT HEAD WITHOUT CONTRAST TECHNIQUE: Contiguous axial images were obtained from the base of the skull through the vertex without intravenous contrast. RADIATION DOSE REDUCTION: This exam was performed according to the departmental dose-optimization program which includes automated exposure control, adjustment of the mA and/or kV according to patient size and/or use of iterative reconstruction technique. COMPARISON:  CT from 09/10/2023 and MRI from 09/08/2023 FINDINGS: Brain: Age-related cerebral atrophy with chronic microvascular ischemic disease. Multiple remote cerebellar infarcts again noted. Well-circumscribed hypodensities involving the cerebellum/middle cerebellar peduncles, consistent with edema, likely due to evolving wallerian degeneration and/or ischemia. Overall appearance is not significantly changed from previous exams. No other acute large vessel territory infarct. No acute intracranial hemorrhage. No mass lesion or midline shift. No hydrocephalus or extra-axial fluid collection. Vascular: No abnormal hyperdense vessel. Calcified atherosclerosis present at the skull base. Skull: Scalp soft tissues within normal limits.  Calvarium intact. Sinuses/Orbits: Globes and orbital soft tissues demonstrate no acute  finding. Paranasal sinuses are largely clear. No mastoid effusion. Other: None. IMPRESSION: 1. Stable head CT with no significant interval change in appearance of the brain as compared to previous exams. 2. Evolving hypodensities involving the cerebellum/middle cerebellar peduncles, consistent with evolving wallerian degeneration and/or ischemia, stable from prior. 3. No other new acute intracranial abnormality. 4. Underlying atrophy with chronic microvascular ischemic disease. Electronically Signed   By: Virgia Griffins M.D.   On: 09/11/2023 22:50   CT HAND RIGHT WO CONTRAST Result Date: 09/11/2023 CLINICAL DATA:  Pain and swelling EXAM: CT OF THE RIGHT HAND WITHOUT CONTRAST TECHNIQUE: Multidetector CT imaging of the right hand was performed according to the standard protocol. Multiplanar CT image reconstructions were also generated. RADIATION DOSE REDUCTION: This exam was performed according to the departmental dose-optimization program which includes automated exposure control, adjustment of the mA and/or kV according to patient size and/or use of iterative reconstruction technique. 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 Small ossicle posterior to the triquetrum, cortical discontinuity along presumed donor site, compatible with triquetral fracture, cannot exclude acute chronicity, correlate with dorsal point tenderness over the triquetrum. Scattered geodes along the carpus and along the distal ulnar articular surface and radial styloid articular surface. Mild degenerative spurring along the carpus. Ligaments Suboptimally assessed by CT. Muscles and Tendons Unremarkable Soft tissues Abnormal thickened appearance of the flexor retinaculum for example on image 105 SERIES 7. Subcutaneous edema along the  dorsal-ulnar margin of the wrist. IMPRESSION: 1. Small ossicle posterior to the triquetrum, cortical discontinuity along presumed donor site, compatible with triquetral fracture, cannot exclude acute chronicity, correlate with dorsal point tenderness over the triquetrum. 2. Abnormal thickened appearance of the flexor retinaculum, query remote flexor retinacular injury or prior release with resulting fibrosis. 3. 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 4. Scattered geodes along the carpus and along the distal ulnar articular surface and radial styloid articular surface. 5. Subcutaneous edema along the dorsal-ulnar margin of the wrist. Electronically Signed   By: Freida Jes M.D.   On: 09/11/2023 10:48   CT FOREARM RIGHT WO CONTRAST Result Date: 09/11/2023  CLINICAL DATA:  Pain and swelling in the forearm EXAM: CT OF THE RIGHT FOREARM WITHOUT CONTRAST TECHNIQUE: Multidetector CT imaging was performed according to the standard protocol. Multiplanar CT image reconstructions were also generated. RADIATION DOSE REDUCTION: This exam was performed according to the departmental dose-optimization program which includes automated exposure control, adjustment of the mA and/or kV according to patient size and/or use of iterative reconstruction technique. COMPARISON:  Elbow radiographs 09/04/2023 and wrist radiographs 09/02/2023 FINDINGS: Bones/Joint/Cartilage Elbow joint effusion noted. Spurring of the radial head, coronoid process, and olecranon. Scattered small geodes of the carpus distal ulnar articular surface as well as the radial styloid. Chondrocalcinosis of the TFCC disc and volar and dorsal to the carpus. Age-indeterminate dorsal triquetral fracture. Ligaments Suboptimally assessed by CT. Muscles and Tendons Unremarkable Soft tissues Mild subcutaneous edema along the ulnar side of the  forearm. IMPRESSION: 1. Age-indeterminate dorsal triquetral fracture. 2. Elbow joint effusion. 3. Chondrocalcinosis of the TFCC disc and volar and dorsal to the carpus. 4. Scattered small geodes of the carpus distal ulnar articular surface as well as the radial styloid. 5. Mild subcutaneous edema along the ulnar side of the forearm. Electronically Signed   By: Freida Jes M.D.   On: 09/11/2023 10:41   VAS US  UPPER EXTREMITY VENOUS DUPLEX Result Date: 09/10/2023 UPPER VENOUS STUDY  Patient Name:  JESAIAH FABIANO  Date of Exam:   09/10/2023 Medical Rec #: [id number]      Accession #:    [id number] Date of Birth: 28-May-1935      Patient Gender: M Patient Age:   32 years Exam Location:  [facility] Procedure:      VAS US  UPPER EXTREMITY VENOUS DUPLEX Referring Phys: Estill Hemming [facility] --------------------------------------------------------------------------------  Indications: Pain, and Swelling Limitations: Poor ultrasound/tissue interface and musculoskeletal features. Comparison Study: No prior study on file Performing Technologist: Carleene Chase RVS  Examination Guidelines: Lorilee Cafarella complete evaluation includes B-mode imaging, spectral Doppler, color Doppler, and power Doppler as needed of all accessible portions of each vessel. Bilateral testing is considered an integral part of Coletta Lockner complete examination. Limited examinations for reoccurring indications may be performed as noted.  Right Findings: +----------+------------+---------+-----------+----------+-------+ RIGHT     CompressiblePhasicitySpontaneousPropertiesSummary +----------+------------+---------+-----------+----------+-------+ IJV                      Yes       Yes                      +----------+------------+---------+-----------+----------+-------+ Subclavian               Yes       Yes                      +----------+------------+---------+-----------+----------+-------+ Axillary                 Yes       Yes                       +----------+------------+---------+-----------+----------+-------+ Brachial      Full                                          +----------+------------+---------+-----------+----------+-------+ Radial        Full       Yes       Yes                      +----------+------------+---------+-----------+----------+-------+  Ulnar         Full                                          +----------+------------+---------+-----------+----------+-------+ Cephalic      Full                                          +----------+------------+---------+-----------+----------+-------+ Basilic                  Yes       Yes                      +----------+------------+---------+-----------+----------+-------+  Left Findings: +----------+------------+---------+-----------+----------+-------+ LEFT      CompressiblePhasicitySpontaneousPropertiesSummary +----------+------------+---------+-----------+----------+-------+ Subclavian               Yes       Yes                      +----------+------------+---------+-----------+----------+-------+  Summary:  Right: No evidence of deep vein thrombosis in the upper extremity. No evidence of superficial vein thrombosis in the upper extremity. Tissue disturbance and subcutaneous edema noted throughout the right upper extremity.  Left: No evidence of thrombosis in the subclavian.  *See table(s) above for measurements and observations.  Diagnosing physician: Runell Countryman Electronically signed by Runell Countryman on 09/10/2023 at 3:44:44 PM.    Final    CT HEAD WO CONTRAST ( ) Result Date: 09/10/2023 CLINICAL DATA:  88 year old male with neurologic deficit. Bilateral cerebellar infarcts. Progressive signal abnormality in the cerebellar peduncles on MRI 2 days ago. Subsequent encounter. EXAM: CT HEAD WITHOUT CONTRAST TECHNIQUE: Contiguous axial images were obtained from the base of the skull through the vertex without intravenous  contrast. RADIATION DOSE REDUCTION: This exam was performed according to the departmental dose-optimization program which includes automated exposure control, adjustment of the mA and/or kV according to patient size and/or use of iterative reconstruction technique. COMPARISON:  Brain MRI 09/08/2023 and earlier. FINDINGS: Brain: Patchy, confluent hypodensity in the bilateral cerebellar hemispheres and cerebellar peduncles corresponding to recent MRI appearance. No hemorrhagic transformation. No posterior fossa mass effect. Basilar cisterns remain normal. No ventriculomegaly. Elsewhere the brainstem gray-white differentiation appears stable. Supratentorial gray-white differentiation is stable. Incidental dural calcification along the falx. No new cerebral edema identified. Vascular: Calcified atherosclerosis at the skull base. No suspicious intracranial vascular hyperdensity. Skull: Stable and intact. Sinuses/Orbits: Visualized paranasal sinuses and mastoids are stable and well aerated. Other: No acute orbit or scalp soft tissue finding. IMPRESSION: 1. Stable from recent MRI. Cerebellar and cerebellar peduncle infarct related edema with no hemorrhagic transformation or mass effect. 2. No new intracranial abnormality. Electronically Signed   By: Marlise Simpers M.D.   On: 09/10/2023 12:33   MR BRAIN WO CONTRAST Addendum Date: 09/09/2023 ADDENDUM REPORT: 09/09/2023 07:52 ADDENDUM: Study discussed by telephone with Dr. Bonnita Buttner, Neurology on 09/09/2023 at 07:49 . We discussed that when compared to 09/02/2023 the progressive and strikingly symmetric signal changes in the bilateral cerebellar peduncles now are most consistent with developing Wallerian degeneration in response to the bilateral cerebellar infarcts about Euna Armon week ago. Those primary infarcts have partially faded now. Early middle cerebellar peduncle signal changes were present initially. No evidence of associated hemorrhage, no mass effect. 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 No hemorrhage or mass effect. Electronically Signed   By: Marlise Simpers M.D.   On: 09/09/2023 07:52   Result Date: 09/09/2023 CLINICAL DATA:  Neuro deficit, acute, stroke suspected EXAM: MRI HEAD WITHOUT CONTRAST TECHNIQUE: Multiplanar, multiecho pulse sequences of the brain and surrounding structures were obtained without intravenous contrast. COMPARISON:  CTA Head/neck Sep 02, 2023. FINDINGS: Brain: Acute infarcts in bilateral cerebellum and middle cerebellar peduncles. Associated edema without significant mass effect. Remote bilateral cerebellar infarcts. Remote infarct in the posterior limb right internal capsule. Patchy T2/FLAIR hyperintensities the white matter, compatible chronic microvascular disease. No evidence of midline shift, acute hemorrhage, mass lesion or hydrocephalus. Vascular: Better evaluated on same day CTA. Skull and upper cervical spine: Normal marrow signal. Sinuses/Orbits: Negative. Other: No mastoid effusions. IMPRESSION: Acute infarcts in bilateral cerebellum and middle cerebellar peduncles. Electronically Signed: By: Stevenson Elbe M.D. On: 09/08/2023 23:54    Microbiology: No results found for this or any previous visit (from the past 240 hours).   Labs: Basic Metabolic Panel: Recent Labs  Lab 10/06/23 0612 10/07/23 0659  NA 141 142  K 3.4* 3.5  CL 101 103  CO2 30 26  GLUCOSE 92 113*  BUN 21 19  CREATININE 1.86* 1.65*  CALCIUM  9.9 10.0   Liver Function Tests: Recent Labs  Lab 10/06/23 0612  AST 20  ALT 19  ALKPHOS 70  BILITOT 0.6  PROT 6.9  ALBUMIN  3.0*   No results for input(s): LIPASE, AMYLASE in the last 168 hours. No results for input(s): AMMONIA in the last 168 hours. CBC: Recent Labs  Lab 10/06/23 0612 10/07/23 0659  WBC 4.2 4.9  NEUTROABS 2.8  --   HGB 10.8* 11.5*  HCT 33.7* 35.4*  MCV 101.2*  99.4  PLT 128* 135*   Cardiac Enzymes: No results for input(s): CKTOTAL, CKMB, CKMBINDEX, TROPONINI in the last 168 hours. BNP: BNP (last 3 results) Recent Labs    07/20/23 1450 07/30/23 0938  BNP 108.9* 228.7*    ProBNP (last 3 results) No results for input(s): PROBNP in the last 8760 hours.  CBG: No results for input(s): GLUCAP in the last 168 hours.     Signed:  Donnetta Gains MD.  [facility] 10/08/2023, 1:26 PM

## 2023-10-08 NOTE — Progress Notes (Signed)
 STROKE TEAM PROGRESS NOTE   INTERIM HISTORY/SUBJECTIVE  No family at the bedside. Patient is awake and alert laying in bed in NAD MR with acute right posterolateral pons.   CBC    Component Value Date/Time   WBC 4.9 10/07/2023 0659   RBC 3.56 (L) 10/07/2023 0659   HGB 11.5 (L) 10/07/2023 0659   HGB 11.0 (L) 10/10/2018 1404   HGB 11.0 (L) 06/14/2018 1222   HCT 35.4 (L) 10/07/2023 0659   HCT 33.9 (L) 06/14/2018 1222   PLT 135 (L) 10/07/2023 0659   PLT 176 10/10/2018 1404   PLT 214 06/14/2018 1222   MCV 99.4 10/07/2023 0659   MCV 92 06/14/2018 1222   MCH 32.3 10/07/2023 0659   MCHC 32.5 10/07/2023 0659   RDW 13.3 10/07/2023 0659   RDW 14.7 06/14/2018 1222   LYMPHSABS 0.8 10/06/2023 0612   MONOABS 0.5 10/06/2023 0612   EOSABS 0.1 10/06/2023 0612   BASOSABS 0.0 10/06/2023 0612    BMET    Component Value Date/Time   NA 142 10/07/2023 0659   NA 143 07/01/2018 1233   K 3.5 10/07/2023 0659   CL 103 10/07/2023 0659   CO2 26 10/07/2023 0659   GLUCOSE 113 (H) 10/07/2023 0659   BUN 19 10/07/2023 0659   BUN 27 07/01/2018 1233   CREATININE 1.65 (H) 10/07/2023 0659   CREATININE 2.06 (H) 10/10/2018 1404   CALCIUM  10.0 10/07/2023 0659   GFRNONAA 40 (L) 10/07/2023 0659   GFRNONAA 29 (L) 10/10/2018 1404    IMAGING past 24 hours DG Swallowing Func-Speech Pathology Result Date: 10/08/2023 Table formatting from the original result was not included. Modified Barium Swallow Study Patient Details Name: Allen Dyer MRN: [id number] Date of Birth: [birth year] Today's Date: 10/08/2023 HPI/PMH: HPI: 88 yo male presents to [facility] on 6/14 with facial paresthesias, breathing difficulty, difficulty ambulating. MRI shows acute R lateral pontine infarct. MBS 5/21 showed aspiration of consecutive sips of thin liquids with cough, but no ejection. Pt drinking from a Provale cup upon d/c. PMH significant for R>L cerebellar CVA 09/02/2023 with d/c from AIR 6/6; CVA in bilat cerebellar and [employer] territories 2023  with residual R weakness and speech difficulty; chronic combined CHF; CAD; stage 4 CKD; HTN; HLD; multiple myeloma; prostate CA s/p XRT; and DM Clinical Impression: Patient presents with mild oropharyngeal dysphagia with a similar presentation to last MBS completed on 5/21. Oral phase is remarkable for R facial and oral weakness resulting in occasional anterior spillage. Patient with reduced lingual control/coordination resulting in consistent posterior spillage of thin liquids to the pyriform sinuses. Pharyngeal phase is remarkable for reduced BOT retraction and reduced pharyngeal stripping wave resulting in vallecular and pyriform sinus residuals, mainly cleared through spontaneous and/or cued double swallows. There was one notable instance of aspiration with thin liquids right below the level of the vocal cords, though visibility limited due to poor patient positioning. Aspiration event was during consecutive sips of thin liquids and likely due to mistiming and inefficiency. No other instances of penetration/aspiration present across all consistencies. Patient consumed pill whole in puree with no difficulty. Recommend continuation of D2/thin liquids with medications whole in puree. Patient should continue utilizing 5cc provale during consumption of thin liquids due to pharyngeal residuals and mistiming of swallow with thin liquids. DIGEST Swallow Severity Rating*  Safety: 1  Efficiency: 1  Overall Pharyngeal Swallow Severity: mild 1: mild; 2: moderate; 3: severe; 4: profound *The Dynamic Imaging Grade of Swallowing Toxicity is standardized for the head and neck  cancer population, however, demonstrates promising clinical applications across populations to standardize the clinical rating of pharyngeal swallow safety and severity. Factors that may increase risk of adverse event in presence of aspiration [name] & [name] 2021): Factors that may increase risk of adverse event in presence of aspiration [name] &  [name] 2021): Weak cough; Frail or deconditioned; Limited mobility; Reduced cognitive function Recommendations/Plan: Swallowing Evaluation Recommendations Swallowing Evaluation Recommendations Recommendations: PO diet PO Diet Recommendation: Dysphagia 2 (Finely chopped); Thin liquids (Level 0) Liquid Administration via: Other (Comment) (5cc provale) Medication Administration: Whole meds with puree Supervision: Full supervision/cueing for swallowing strategies Swallowing strategies  : Slow rate; Small bites/sips; Check for pocketing or oral holding Postural changes: Position pt fully upright for meals Oral care recommendations: Oral care BID (2x/day) Treatment Plan Treatment Plan Treatment recommendations: Therapy as outlined in treatment plan below Follow-up recommendations: Outpatient SLP Functional status assessment: Patient has had a recent decline in their functional status and demonstrates the ability to make significant improvements in function in a reasonable and predictable amount of time. Treatment frequency: Min 2x/week Treatment duration: 2 weeks Interventions: Aspiration precaution training; Oropharyngeal exercises; Compensatory techniques; Patient/family education; Diet toleration management by SLP; Trials of upgraded texture/liquids; Respiratory muscle strength training Recommendations Recommendations for follow up therapy are one component of a multi-disciplinary discharge planning process, led by the attending physician.  Recommendations may be updated based on patient status, additional functional criteria and insurance authorization. Assessment: Orofacial Exam: Orofacial Exam Oral Cavity: Oral Hygiene: WFL Oral Cavity - Dentition: Dentures, not available; Missing dentition; Poor condition Anatomy: Anatomy: WFL Boluses Administered: Boluses Administered Boluses Administered: Thin liquids (Level 0); Mildly thick liquids (Level 2, nectar thick); Puree; Solid  Oral Impairment Domain: Oral Impairment  Domain Lip Closure: Escape progressing to mid-chin Tongue control during bolus hold: Cohesive bolus between tongue to palatal seal Bolus preparation/mastication: Slow prolonged chewing/mashing with complete recollection Bolus transport/lingual motion: Repetitive/disorganized tongue motion Oral residue: Residue collection on oral structures Location of oral residue : Tongue; Palate Initiation of pharyngeal swallow : Pyriform sinuses  Pharyngeal Impairment Domain: Pharyngeal Impairment Domain Soft palate elevation: No bolus between soft palate (SP)/pharyngeal wall (PW) Laryngeal elevation: Complete superior movement of thyroid  cartilage with complete approximation of arytenoids to epiglottic petiole Anterior hyoid excursion: Complete anterior movement Epiglottic movement: Complete inversion Laryngeal vestibule closure: Incomplete, narrow column air/contrast in laryngeal vestibule Pharyngeal stripping wave : Present - diminished Pharyngeal contraction (A/P view only): N/A Pharyngoesophageal segment opening: Complete distension and complete duration, no obstruction of flow Tongue base retraction: Narrow column of contrast or air between tongue base and PPW Pharyngeal residue: Collection of residue within or on pharyngeal structures Location of pharyngeal residue: Valleculae; Pyriform sinuses; Tongue base  Esophageal Impairment Domain: Esophageal Impairment Domain Esophageal clearance upright position: Complete clearance, esophageal coating Pill: Pill Consistency administered: Puree Puree: WFL Penetration/Aspiration Scale Score: Penetration/Aspiration Scale Score 1.  Material does not enter airway: Mildly thick liquids (Level 2, nectar thick); Puree; Solid; Pill 6.  Material enters airway, passes BELOW cords then ejected out: Thin liquids (Level 0) Compensatory Strategies: Compensatory Strategies Compensatory strategies: Yes Multiple swallows: Effective Effective Multiple Swallows: Thin liquid (Level 0) Chin tuck:  Ineffective Ineffective Chin Tuck: Thin liquid (Level 0)   General Information: Caregiver present: No  Diet Prior to this Study: Dysphagia 2 (finely chopped); Thin liquids (Level 0)   No data recorded  Respiratory Status: WFL   Supplemental O2: None (Room air)   No data recorded Behavior/Cognition: Alert; Cooperative; Requires cueing Self-Feeding Abilities: Needs  assist with self-feeding Baseline vocal quality/speech: Hypophonia/low volume Volitional Cough: Able to elicit Volitional Swallow: Able to elicit Exam Limitations: Limited visibility (limited visibility of trachea during aspiration event due to positioning) Goal Planning: Prognosis for improved oropharyngeal function: Good Barriers to Reach Goals: Cognitive deficits No data recorded No data recorded Consulted and agree with results and recommendations: Patient Pain: Pain Assessment Pain Assessment: No/denies pain End of Session: Start Time:SLP Start Time (ACUTE ONLY): 0900 Stop Time: SLP Stop Time (ACUTE ONLY): 0925 Time Calculation:SLP Time Calculation (min) (ACUTE ONLY): 25 min Charges: SLP Evaluations $ SLP Speech Visit: 1 Visit SLP Evaluations $BSS Swallow: 1 Procedure $MBS Swallow: 1 Procedure SLP visit diagnosis: SLP Visit Diagnosis: Dysphagia, oropharyngeal phase (R13.12) Past Medical History: Past Medical History: Diagnosis Date  Chronic combined systolic and diastolic CHF (congestive heart failure) (HCC)   CKD (chronic kidney disease), stage III (HCC)   Hyperlipidemia   Hypertension   Lower extremity edema   Mild CAD   a. mild-mod by cath 05/2018.  Multiple myeloma (HCC) 11/15/2018  NICM (nonischemic cardiomyopathy) (HCC)   Peripheral neuropathy   Prostate cancer (HCC)   Status post XRT  Rheumatic fever   Stroke (HCC)   Trifascicular block   Type 2 diabetes mellitus (HCC)   Vertigo  Past Surgical History: Past Surgical History: Procedure Laterality Date  RIGHT HEART CATH N/A 07/10/2018  Procedure: RIGHT HEART CATH;  Surgeon: Darlis Eisenmenger, MD;   Location: [facility] INVASIVE CV LAB;  Service: Cardiovascular;  Laterality: N/A;  RIGHT/LEFT HEART CATH AND CORONARY ANGIOGRAPHY N/A 06/18/2018  Procedure: RIGHT/LEFT HEART CATH AND CORONARY ANGIOGRAPHY;  Surgeon: Millicent Ally, MD;  Location: [facility] INVASIVE CV LAB;  Service: Cardiovascular;  Laterality: N/A; Cassidi F Sockwell 10/08/2023, 10:12 AM   Vitals:   10/07/23 2300 10/08/23 0400 10/08/23 0750 10/08/23 1213  BP: 136/73 (!) 156/99 (!) 165/77 (!) 143/77  Pulse: 76 78 72 71  Resp: 16  20 20   Temp: 98.7 F (37.1 C) 98.4 F (36.9 C) 98.7 F (37.1 C) 98.7 F (37.1 C)  TempSrc: Axillary Oral Oral Oral  SpO2: 95% 100% 93% 94%  Weight:      Height:         PHYSICAL EXAM General:  Alert, well-nourished, well-developed patient in no acute distress Psych:  Mood and affect appropriate for situation CV: Regular rate and rhythm on monitor Respiratory:  Regular, unlabored respirations on room air GI: Abdomen soft and nontender   NEURO:  Mental Status: AA&Ox3, patient is able to give clear and coherent history Speech/Language: speech is without aphasia.  Dysarthric speech. Naming, repetition, fluency, and comprehension intact.  Cranial Nerves:  II: PERRL. Visual fields full.  III, IV, VI: EOMI. Eyelids elevate symmetrically.  V: Sensation is intact to light touch and symmetrical to face.  VII: left facial VIII: hearing intact to voice. IX, X: Palate elevates symmetrically. Phonation is normal.  [id number] shrug 5/5. XII: tongue deviates to the left  Motor: 5/5 strength tin bilateral uppers, bilateral lowers 4+/5 Tone: is normal and bulk is normal Sensation- Intact to light touch bilaterally. Extinction absent to light touch to DSS.   Coordination: FTN intact bilaterally, HKS: no ataxia in BLE.No drift.  Gait- deferred  Most Recent NIH  1a Level of Conscious.:  1b LOC Questions:  1c LOC Commands:  2 Best Gaze:  3 Visual:  4 Facial Palsy: 1 5a Motor Arm - left:  5b Motor Arm -  Right:  6a Motor Leg - Left: 1 6b Motor  Leg - Right: 1 7 Limb Ataxia:  8 Sensory:  9 Best Language:  10 Dysarthria: 1 11 Extinct. and Inatten.:  TOTAL: 4   ASSESSMENT/PLAN  [name] is a 88 y.o. male with history of CHF, hypertension, hyperlipidemia, multiple strokes, last 5/25, GERD, prostate cancer, gout, multiple myeloma, CAD, diabetes, peripheral neuropathy and CKD stage IV who presents with difficulty breathing, difficulty ambulating and right facial numbness NIH on Admission 5  Acute Ischemic Infarct:  right pons Etiology:  small vessel disease   CT head Evolving right thalamic territory nonhemorrhagic infarct. Bilateral subacute cerebellar peduncle infarcts. MRI   Acute nonhemorrhagic infarct in the right posterolateral pons. No other acute infarcts. 2. Multiple remote lacunar infarcts in the cerebellum bilaterally, left paramedian pons, and posterior right basal ganglia. 3. Evolving signal changes in the cerebellar peduncles bilaterally are consistent with a now subacute ischemic event. 4. Remote cortical infarct in the right occipital pole. 5. Multiple punctate foci of remote hemorrhage in the left cerebellar peduncle. No acute hemorrhage. 6. Mild atrophy and periventricular white matter change within normal limits MRA  Known bilateral vertebral artery occlusions. Unchanged advanced anterior circulation atherosclerosis including severe right and moderate left ICA stenoses. Carotid Doppler  ordered 2D Echo 09/03/2023: EF 25-30%. LV global hypokinesis, moderate concentric LVH with grade I diastolic dysfunction. Left atrium mildly dilated  LDL 73 HgbA1c 7.0 VTE prophylaxis - heparin  SQ aspirin  81 mg daily and clopidogrel  75 mg daily prior to admission, now on aspirin  81 mg daily and clopidogrel  75 mg daily   Therapy recommendations:  Pending Disposition:  pending  Hx of Stroke/TIA  Multiple strokes on imaging  Continue ASA/plavix   Recommend  30 day heart monitor  after discharge   Hypertension CAD Nonischemic cardiomyopathy  CHF Home meds:   coreg  3.125mg , Imdur  30mg , propanolol 10mg , torsemide  10mg ,  Stable Blood Pressure Goal: BP less than 220/110   Hyperlipidemia Home meds:  none LDL 73, goal < 70 Add crestor 10mg   Continue statin at discharge  Diabetes type II UnControlled Home meds:  glipizide  5mg   HgbA1c 7.0, goal < 7.0 CBGs SSI Recommend close follow-up with PCP for better DM control  Dysphagia Patient has post-stroke dysphagia, SLP consulted    Diet   DIET DYS 2 Fluid consistency: Thin   Advance diet as tolerated  Other Stroke Risk Factors  Obesity, Body mass index is 31.49 kg/m., BMI >/= 30 associated with increased stroke risk, recommend weight loss, diet and exercise as appropriate  Coronary artery disease Congestive heart failure   Other Active Problems CKD Peripheral neuropathy  BPH   Hospital day # 1  I have personally obtained history,examined this patient, reviewed notes, independently viewed imaging studies, participated in medical decision making and plan of care.ROS completed by me personally and pertinent positives fully documented  I have made any additions or clarifications directly to the above note. Agree with note above.  Continue aspirin  and Plavix  which patient was on prior to admission and maintain aggressive risk factor modification.  Follow-up with an outpatient stroke clinic with nurse practitioner in 2 months.  Stroke team will sign off.  Kindly call for questions.  Discussed with Dr. Ada Acres.  Greater than 50% time during this 35-minute visit was spent in counseling coordination of care about his pontine stroke and discussion about stroke prevention and treatment and answering questions.  Ardella Beaver, MD Medical Director [facility] Pager: [phone] 10/08/2023 1:41 PM   Neurology  10/08/2023 1:40 PM   To  contact Stroke Continuity provider, please refer to [email or web address]. After  hours, contact General Neurology

## 2023-10-08 NOTE — Plan of Care (Signed)
 Assumed care at 1900. Pt has been resting comfortably in bed overnight. Pt is Aox3. Pt has not been wanting to reposition overnight. Pt refused to eat dinner last night. No c/o pain or discomfort. Daughter came by to visit overnight. No significant events.   Problem: Ischemic Stroke/TIA Tissue Perfusion: Goal: Complications of ischemic stroke/TIA will be minimized Outcome: Progressing   Problem: Coping: Goal: Will verbalize positive feelings about self Outcome: Progressing Goal: Will identify appropriate support needs Outcome: Progressing   Problem: Health Behavior/Discharge Planning: Goal: Ability to manage health-related needs will improve Outcome: Progressing Goal: Goals will be collaboratively established with patient/family Outcome: Progressing   Problem: Self-Care: Goal: Ability to participate in self-care as condition permits will improve Outcome: Progressing Goal: Verbalization of feelings and concerns over difficulty with self-care will improve Outcome: Progressing Goal: Ability to communicate needs accurately will improve Outcome: Progressing   Problem: Education: Goal: Knowledge of General Education information will improve Description: Including pain rating scale, medication(s)/side effects and non-pharmacologic comfort measures Outcome: Progressing   Problem: Clinical Measurements: Goal: Ability to maintain clinical measurements within normal limits will improve Outcome: Progressing Goal: Will remain free from infection Outcome: Progressing Goal: Diagnostic test results will improve Outcome: Progressing Goal: Respiratory complications will improve Outcome: Progressing Goal: Cardiovascular complication will be avoided Outcome: Progressing   Problem: Pain Managment: Goal: General experience of comfort will improve and/or be controlled Outcome: Progressing   Problem: Education: Goal: Knowledge of disease or condition will improve Outcome: Progressing Goal:  Knowledge of secondary prevention will improve (MUST DOCUMENT ALL) Outcome: Progressing Goal: Knowledge of patient specific risk factors will improve (DELETE if not current risk factor) Outcome: Progressing

## 2023-10-08 NOTE — Progress Notes (Signed)
 Speech Language Pathology Treatment: Dysphagia  Patient Details Name: Allen Dyer MRN: [id number] DOB: January 27, 1936 Today's Date: 10/08/2023 Time: 1010-1020 SLP Time Calculation (min) (ACUTE ONLY): 10 min  Assessment / Plan / Recommendation Clinical Impression  Skilled therapy session focused on patient education. SLP educated patient on MBS results and subsequent diet recommendations. SLP recommended continuation of current diet of D2/thin liquids via provale cup. SLP educated patient on importance of utilizing provale cup during thin liquids and utilizing a double swallow to clear pharyngeal residuals. Patient verbalized understanding with no further questions. SLP left handout in room with all strategies discussed. Patient left in bed with alarm set and call bell in reach. Continue POC.      HPI HPI: 88 yo male presents to [facility] on 6/14 with facial paresthesias, breathing difficulty, difficulty ambulating. MRI shows acute R lateral pontine infarct. MBS 5/21 showed aspiration of consecutive sips of thin liquids with cough, but no ejection. Pt drinking from a Provale cup upon d/c. PMH significant for R>L cerebellar CVA 09/02/2023 with d/c from AIR 6/6; CVA in bilat cerebellar and [employer] territories 2023 with residual R weakness and speech difficulty; chronic combined CHF; CAD; stage 4 CKD; HTN; HLD; multiple myeloma; prostate CA s/p XRT; and DM      SLP Plan  Continue with current plan of care          Recommendations  Diet recommendations: Dysphagia 2 (fine chop);Thin liquid Liquids provided via:  (provale) Medication Administration: Whole meds with puree Supervision: Full supervision/cueing for compensatory strategies Compensations: Minimize environmental distractions;Slow rate;Small sips/bites Postural Changes and/or Swallow Maneuvers: Seated upright 90 degrees                  Oral care BID     Dysphagia, oropharyngeal phase (R13.12)     Continue with current plan of care     Cleo Villamizar M.A., CCC-SLP 10/08/2023, 10:22 AM

## 2023-10-08 NOTE — Telephone Encounter (Signed)
 Order for Mask fitting sent to [facility] today and recent download sent to Dr. Micael Adas for advice.

## 2023-10-08 NOTE — Progress Notes (Signed)
 discharge instructions reviewed with daughter bedside. She acknowledged she had full understanding. Slip given that Child psychotherapist printed off for her. Belongings taken home by daughter. Patient dressed and a brief applied for discharge. IV removed. PTAR wheeled pt off unit.

## 2023-10-08 NOTE — TOC Transition Note (Signed)
 Transition of Care [facility]) - Discharge Note   Patient Details  Name: Allen Dyer MRN: [id number] Date of Birth: 12-10-1935  Transition of Care [facility]) CM/SW Contact:  Jonathan Neighbor, RN Phone Number: 10/08/2023, 2:18 PM   Clinical Narrative:     Pt is discharging home with home health through [facility]. Information on the AVS. [facility] will contact the daughter for the first home visit. Pt has needed DME at home. Pt is transporting home via [facility]. Address verified with daughter. PTAR arranged. Bedside RN aware.  Final next level of care: Home w Home Health Services Barriers to Discharge: No Barriers Identified   Patient Goals and CMS Choice            Discharge Placement                Patient to be transferred to facility by: [facility] Name of family member notified: [name] Patient and family notified of of transfer: 10/07/23  Discharge Plan and Services Additional resources added to the After Visit Summary for                            [facility] Arranged: RN, PT, OT, Nurse's Aide, Speech Therapy HH Agency: [facility] Date [facility] Agency Contacted: 10/07/23   Representative spoke with at [facility] Agency: message left for [employer] Drivers of Health (SDOH) Interventions SDOH Screenings   Food Insecurity: No Food Insecurity (09/02/2023)  Housing: Low Risk  (09/02/2023)  Transportation Needs: No Transportation Needs (09/02/2023)  Utilities: Not At Risk (09/02/2023)  Depression (PHQ2-9): Low Risk  (08/16/2023)  Social Connections: Moderately Integrated (09/02/2023)  Tobacco Use: Medium Risk (10/06/2023)  Health Literacy: Adequate Health Literacy (01/24/2023)     Readmission Risk Interventions     No data to display

## 2023-10-10 DIAGNOSIS — I69351 Hemiplegia and hemiparesis following cerebral infarction affecting right dominant side: Secondary | ICD-10-CM | POA: Diagnosis not present

## 2023-10-10 DIAGNOSIS — E1122 Type 2 diabetes mellitus with diabetic chronic kidney disease: Secondary | ICD-10-CM | POA: Diagnosis not present

## 2023-10-10 DIAGNOSIS — I5042 Chronic combined systolic (congestive) and diastolic (congestive) heart failure: Secondary | ICD-10-CM | POA: Diagnosis not present

## 2023-10-10 DIAGNOSIS — N184 Chronic kidney disease, stage 4 (severe): Secondary | ICD-10-CM | POA: Diagnosis not present

## 2023-10-10 DIAGNOSIS — I69392 Facial weakness following cerebral infarction: Secondary | ICD-10-CM | POA: Diagnosis not present

## 2023-10-10 DIAGNOSIS — I130 Hypertensive heart and chronic kidney disease with heart failure and stage 1 through stage 4 chronic kidney disease, or unspecified chronic kidney disease: Secondary | ICD-10-CM | POA: Diagnosis not present

## 2023-10-11 ENCOUNTER — Encounter: Admitting: Registered Nurse

## 2023-10-14 DIAGNOSIS — I639 Cerebral infarction, unspecified: Secondary | ICD-10-CM | POA: Diagnosis not present

## 2023-10-14 DIAGNOSIS — I63543 Cerebral infarction due to unspecified occlusion or stenosis of bilateral cerebellar arteries: Secondary | ICD-10-CM | POA: Diagnosis not present

## 2023-10-15 DIAGNOSIS — I5042 Chronic combined systolic (congestive) and diastolic (congestive) heart failure: Secondary | ICD-10-CM | POA: Diagnosis not present

## 2023-10-15 DIAGNOSIS — N184 Chronic kidney disease, stage 4 (severe): Secondary | ICD-10-CM | POA: Diagnosis not present

## 2023-10-15 DIAGNOSIS — I130 Hypertensive heart and chronic kidney disease with heart failure and stage 1 through stage 4 chronic kidney disease, or unspecified chronic kidney disease: Secondary | ICD-10-CM | POA: Diagnosis not present

## 2023-10-15 DIAGNOSIS — I69351 Hemiplegia and hemiparesis following cerebral infarction affecting right dominant side: Secondary | ICD-10-CM | POA: Diagnosis not present

## 2023-10-15 DIAGNOSIS — I69392 Facial weakness following cerebral infarction: Secondary | ICD-10-CM | POA: Diagnosis not present

## 2023-10-15 DIAGNOSIS — E1122 Type 2 diabetes mellitus with diabetic chronic kidney disease: Secondary | ICD-10-CM | POA: Diagnosis not present

## 2023-10-16 ENCOUNTER — Ambulatory Visit: Attending: Cardiology | Admitting: Cardiology

## 2023-10-16 DIAGNOSIS — N184 Chronic kidney disease, stage 4 (severe): Secondary | ICD-10-CM | POA: Diagnosis not present

## 2023-10-16 DIAGNOSIS — I130 Hypertensive heart and chronic kidney disease with heart failure and stage 1 through stage 4 chronic kidney disease, or unspecified chronic kidney disease: Secondary | ICD-10-CM | POA: Diagnosis not present

## 2023-10-16 DIAGNOSIS — E1122 Type 2 diabetes mellitus with diabetic chronic kidney disease: Secondary | ICD-10-CM | POA: Diagnosis not present

## 2023-10-16 DIAGNOSIS — I69392 Facial weakness following cerebral infarction: Secondary | ICD-10-CM | POA: Diagnosis not present

## 2023-10-16 DIAGNOSIS — I69351 Hemiplegia and hemiparesis following cerebral infarction affecting right dominant side: Secondary | ICD-10-CM | POA: Diagnosis not present

## 2023-10-16 DIAGNOSIS — I5042 Chronic combined systolic (congestive) and diastolic (congestive) heart failure: Secondary | ICD-10-CM | POA: Diagnosis not present

## 2023-10-17 ENCOUNTER — Encounter: Payer: Self-pay | Admitting: Cardiology

## 2023-10-18 DIAGNOSIS — N184 Chronic kidney disease, stage 4 (severe): Secondary | ICD-10-CM | POA: Diagnosis not present

## 2023-10-18 DIAGNOSIS — I69351 Hemiplegia and hemiparesis following cerebral infarction affecting right dominant side: Secondary | ICD-10-CM | POA: Diagnosis not present

## 2023-10-18 DIAGNOSIS — I69392 Facial weakness following cerebral infarction: Secondary | ICD-10-CM | POA: Diagnosis not present

## 2023-10-18 DIAGNOSIS — I5042 Chronic combined systolic (congestive) and diastolic (congestive) heart failure: Secondary | ICD-10-CM | POA: Diagnosis not present

## 2023-10-18 DIAGNOSIS — I130 Hypertensive heart and chronic kidney disease with heart failure and stage 1 through stage 4 chronic kidney disease, or unspecified chronic kidney disease: Secondary | ICD-10-CM | POA: Diagnosis not present

## 2023-10-18 DIAGNOSIS — E1122 Type 2 diabetes mellitus with diabetic chronic kidney disease: Secondary | ICD-10-CM | POA: Diagnosis not present

## 2023-10-22 DIAGNOSIS — E1122 Type 2 diabetes mellitus with diabetic chronic kidney disease: Secondary | ICD-10-CM | POA: Diagnosis not present

## 2023-10-22 DIAGNOSIS — I130 Hypertensive heart and chronic kidney disease with heart failure and stage 1 through stage 4 chronic kidney disease, or unspecified chronic kidney disease: Secondary | ICD-10-CM | POA: Diagnosis not present

## 2023-10-22 DIAGNOSIS — I69351 Hemiplegia and hemiparesis following cerebral infarction affecting right dominant side: Secondary | ICD-10-CM | POA: Diagnosis not present

## 2023-10-22 DIAGNOSIS — I69392 Facial weakness following cerebral infarction: Secondary | ICD-10-CM | POA: Diagnosis not present

## 2023-10-22 DIAGNOSIS — N184 Chronic kidney disease, stage 4 (severe): Secondary | ICD-10-CM | POA: Diagnosis not present

## 2023-10-22 DIAGNOSIS — I5042 Chronic combined systolic (congestive) and diastolic (congestive) heart failure: Secondary | ICD-10-CM | POA: Diagnosis not present

## 2023-10-23 DIAGNOSIS — I5042 Chronic combined systolic (congestive) and diastolic (congestive) heart failure: Secondary | ICD-10-CM | POA: Diagnosis not present

## 2023-10-23 DIAGNOSIS — I69351 Hemiplegia and hemiparesis following cerebral infarction affecting right dominant side: Secondary | ICD-10-CM | POA: Diagnosis not present

## 2023-10-23 DIAGNOSIS — I130 Hypertensive heart and chronic kidney disease with heart failure and stage 1 through stage 4 chronic kidney disease, or unspecified chronic kidney disease: Secondary | ICD-10-CM | POA: Diagnosis not present

## 2023-10-23 DIAGNOSIS — I69392 Facial weakness following cerebral infarction: Secondary | ICD-10-CM | POA: Diagnosis not present

## 2023-10-23 DIAGNOSIS — N184 Chronic kidney disease, stage 4 (severe): Secondary | ICD-10-CM | POA: Diagnosis not present

## 2023-10-23 DIAGNOSIS — E1122 Type 2 diabetes mellitus with diabetic chronic kidney disease: Secondary | ICD-10-CM | POA: Diagnosis not present

## 2023-10-24 DIAGNOSIS — I10 Essential (primary) hypertension: Secondary | ICD-10-CM | POA: Diagnosis not present

## 2023-10-24 DIAGNOSIS — I639 Cerebral infarction, unspecified: Secondary | ICD-10-CM | POA: Diagnosis not present

## 2023-10-24 DIAGNOSIS — G8191 Hemiplegia, unspecified affecting right dominant side: Secondary | ICD-10-CM | POA: Diagnosis not present

## 2023-10-24 DIAGNOSIS — I69351 Hemiplegia and hemiparesis following cerebral infarction affecting right dominant side: Secondary | ICD-10-CM | POA: Diagnosis not present

## 2023-10-24 DIAGNOSIS — I5022 Chronic systolic (congestive) heart failure: Secondary | ICD-10-CM | POA: Diagnosis not present

## 2023-10-24 DIAGNOSIS — I5042 Chronic combined systolic (congestive) and diastolic (congestive) heart failure: Secondary | ICD-10-CM | POA: Diagnosis not present

## 2023-10-24 DIAGNOSIS — I739 Peripheral vascular disease, unspecified: Secondary | ICD-10-CM | POA: Diagnosis not present

## 2023-10-24 DIAGNOSIS — I130 Hypertensive heart and chronic kidney disease with heart failure and stage 1 through stage 4 chronic kidney disease, or unspecified chronic kidney disease: Secondary | ICD-10-CM | POA: Diagnosis not present

## 2023-10-24 DIAGNOSIS — N184 Chronic kidney disease, stage 4 (severe): Secondary | ICD-10-CM | POA: Diagnosis not present

## 2023-10-24 DIAGNOSIS — R413 Other amnesia: Secondary | ICD-10-CM | POA: Diagnosis not present

## 2023-10-24 DIAGNOSIS — H1031 Unspecified acute conjunctivitis, right eye: Secondary | ICD-10-CM | POA: Diagnosis not present

## 2023-10-24 DIAGNOSIS — E1122 Type 2 diabetes mellitus with diabetic chronic kidney disease: Secondary | ICD-10-CM | POA: Diagnosis not present

## 2023-10-24 DIAGNOSIS — I69392 Facial weakness following cerebral infarction: Secondary | ICD-10-CM | POA: Diagnosis not present

## 2023-10-25 DIAGNOSIS — I5042 Chronic combined systolic (congestive) and diastolic (congestive) heart failure: Secondary | ICD-10-CM | POA: Diagnosis not present

## 2023-10-25 DIAGNOSIS — I69392 Facial weakness following cerebral infarction: Secondary | ICD-10-CM | POA: Diagnosis not present

## 2023-10-25 DIAGNOSIS — I130 Hypertensive heart and chronic kidney disease with heart failure and stage 1 through stage 4 chronic kidney disease, or unspecified chronic kidney disease: Secondary | ICD-10-CM | POA: Diagnosis not present

## 2023-10-25 DIAGNOSIS — E1122 Type 2 diabetes mellitus with diabetic chronic kidney disease: Secondary | ICD-10-CM | POA: Diagnosis not present

## 2023-10-25 DIAGNOSIS — N184 Chronic kidney disease, stage 4 (severe): Secondary | ICD-10-CM | POA: Diagnosis not present

## 2023-10-25 DIAGNOSIS — I69351 Hemiplegia and hemiparesis following cerebral infarction affecting right dominant side: Secondary | ICD-10-CM | POA: Diagnosis not present

## 2023-10-28 DIAGNOSIS — N39498 Other specified urinary incontinence: Secondary | ICD-10-CM | POA: Diagnosis not present

## 2023-10-28 DIAGNOSIS — R1312 Dysphagia, oropharyngeal phase: Secondary | ICD-10-CM | POA: Diagnosis not present

## 2023-10-28 DIAGNOSIS — G4733 Obstructive sleep apnea (adult) (pediatric): Secondary | ICD-10-CM | POA: Diagnosis not present

## 2023-10-28 DIAGNOSIS — K219 Gastro-esophageal reflux disease without esophagitis: Secondary | ICD-10-CM | POA: Diagnosis not present

## 2023-10-28 DIAGNOSIS — I69351 Hemiplegia and hemiparesis following cerebral infarction affecting right dominant side: Secondary | ICD-10-CM | POA: Diagnosis not present

## 2023-10-28 DIAGNOSIS — M109 Gout, unspecified: Secondary | ICD-10-CM | POA: Diagnosis not present

## 2023-10-28 DIAGNOSIS — B961 Klebsiella pneumoniae [K. pneumoniae] as the cause of diseases classified elsewhere: Secondary | ICD-10-CM | POA: Diagnosis not present

## 2023-10-28 DIAGNOSIS — N390 Urinary tract infection, site not specified: Secondary | ICD-10-CM | POA: Diagnosis not present

## 2023-10-28 DIAGNOSIS — N401 Enlarged prostate with lower urinary tract symptoms: Secondary | ICD-10-CM | POA: Diagnosis not present

## 2023-10-28 DIAGNOSIS — K5900 Constipation, unspecified: Secondary | ICD-10-CM | POA: Diagnosis not present

## 2023-10-28 DIAGNOSIS — I130 Hypertensive heart and chronic kidney disease with heart failure and stage 1 through stage 4 chronic kidney disease, or unspecified chronic kidney disease: Secondary | ICD-10-CM | POA: Diagnosis not present

## 2023-10-28 DIAGNOSIS — I69392 Facial weakness following cerebral infarction: Secondary | ICD-10-CM | POA: Diagnosis not present

## 2023-10-28 DIAGNOSIS — I471 Supraventricular tachycardia, unspecified: Secondary | ICD-10-CM | POA: Diagnosis not present

## 2023-10-28 DIAGNOSIS — I453 Trifascicular block: Secondary | ICD-10-CM | POA: Diagnosis not present

## 2023-10-28 DIAGNOSIS — E785 Hyperlipidemia, unspecified: Secondary | ICD-10-CM | POA: Diagnosis not present

## 2023-10-28 DIAGNOSIS — E1151 Type 2 diabetes mellitus with diabetic peripheral angiopathy without gangrene: Secondary | ICD-10-CM | POA: Diagnosis not present

## 2023-10-28 DIAGNOSIS — I272 Pulmonary hypertension, unspecified: Secondary | ICD-10-CM | POA: Diagnosis not present

## 2023-10-28 DIAGNOSIS — E1142 Type 2 diabetes mellitus with diabetic polyneuropathy: Secondary | ICD-10-CM | POA: Diagnosis not present

## 2023-10-28 DIAGNOSIS — N184 Chronic kidney disease, stage 4 (severe): Secondary | ICD-10-CM | POA: Diagnosis not present

## 2023-10-28 DIAGNOSIS — I2510 Atherosclerotic heart disease of native coronary artery without angina pectoris: Secondary | ICD-10-CM | POA: Diagnosis not present

## 2023-10-28 DIAGNOSIS — I5042 Chronic combined systolic (congestive) and diastolic (congestive) heart failure: Secondary | ICD-10-CM | POA: Diagnosis not present

## 2023-10-28 DIAGNOSIS — E1122 Type 2 diabetes mellitus with diabetic chronic kidney disease: Secondary | ICD-10-CM | POA: Diagnosis not present

## 2023-10-28 DIAGNOSIS — R338 Other retention of urine: Secondary | ICD-10-CM | POA: Diagnosis not present

## 2023-10-28 DIAGNOSIS — I428 Other cardiomyopathies: Secondary | ICD-10-CM | POA: Diagnosis not present

## 2023-10-28 DIAGNOSIS — E669 Obesity, unspecified: Secondary | ICD-10-CM | POA: Diagnosis not present

## 2023-10-29 DIAGNOSIS — N184 Chronic kidney disease, stage 4 (severe): Secondary | ICD-10-CM | POA: Diagnosis not present

## 2023-10-29 DIAGNOSIS — I130 Hypertensive heart and chronic kidney disease with heart failure and stage 1 through stage 4 chronic kidney disease, or unspecified chronic kidney disease: Secondary | ICD-10-CM | POA: Diagnosis not present

## 2023-10-29 DIAGNOSIS — E1122 Type 2 diabetes mellitus with diabetic chronic kidney disease: Secondary | ICD-10-CM | POA: Diagnosis not present

## 2023-10-29 DIAGNOSIS — I69392 Facial weakness following cerebral infarction: Secondary | ICD-10-CM | POA: Diagnosis not present

## 2023-10-29 DIAGNOSIS — I5042 Chronic combined systolic (congestive) and diastolic (congestive) heart failure: Secondary | ICD-10-CM | POA: Diagnosis not present

## 2023-10-29 DIAGNOSIS — I69351 Hemiplegia and hemiparesis following cerebral infarction affecting right dominant side: Secondary | ICD-10-CM | POA: Diagnosis not present

## 2023-10-30 DIAGNOSIS — I69351 Hemiplegia and hemiparesis following cerebral infarction affecting right dominant side: Secondary | ICD-10-CM | POA: Diagnosis not present

## 2023-10-30 DIAGNOSIS — I130 Hypertensive heart and chronic kidney disease with heart failure and stage 1 through stage 4 chronic kidney disease, or unspecified chronic kidney disease: Secondary | ICD-10-CM | POA: Diagnosis not present

## 2023-10-30 DIAGNOSIS — E1122 Type 2 diabetes mellitus with diabetic chronic kidney disease: Secondary | ICD-10-CM | POA: Diagnosis not present

## 2023-10-30 DIAGNOSIS — I5042 Chronic combined systolic (congestive) and diastolic (congestive) heart failure: Secondary | ICD-10-CM | POA: Diagnosis not present

## 2023-10-30 DIAGNOSIS — N184 Chronic kidney disease, stage 4 (severe): Secondary | ICD-10-CM | POA: Diagnosis not present

## 2023-10-30 DIAGNOSIS — I69392 Facial weakness following cerebral infarction: Secondary | ICD-10-CM | POA: Diagnosis not present

## 2023-10-31 DIAGNOSIS — I69392 Facial weakness following cerebral infarction: Secondary | ICD-10-CM | POA: Diagnosis not present

## 2023-10-31 DIAGNOSIS — I69351 Hemiplegia and hemiparesis following cerebral infarction affecting right dominant side: Secondary | ICD-10-CM | POA: Diagnosis not present

## 2023-10-31 DIAGNOSIS — E1122 Type 2 diabetes mellitus with diabetic chronic kidney disease: Secondary | ICD-10-CM | POA: Diagnosis not present

## 2023-10-31 DIAGNOSIS — I130 Hypertensive heart and chronic kidney disease with heart failure and stage 1 through stage 4 chronic kidney disease, or unspecified chronic kidney disease: Secondary | ICD-10-CM | POA: Diagnosis not present

## 2023-10-31 DIAGNOSIS — I5042 Chronic combined systolic (congestive) and diastolic (congestive) heart failure: Secondary | ICD-10-CM | POA: Diagnosis not present

## 2023-10-31 DIAGNOSIS — N184 Chronic kidney disease, stage 4 (severe): Secondary | ICD-10-CM | POA: Diagnosis not present

## 2023-11-01 ENCOUNTER — Other Ambulatory Visit: Payer: Self-pay

## 2023-11-01 ENCOUNTER — Telehealth: Payer: Self-pay

## 2023-11-01 DIAGNOSIS — E1122 Type 2 diabetes mellitus with diabetic chronic kidney disease: Secondary | ICD-10-CM | POA: Diagnosis not present

## 2023-11-01 DIAGNOSIS — I5042 Chronic combined systolic (congestive) and diastolic (congestive) heart failure: Secondary | ICD-10-CM | POA: Diagnosis not present

## 2023-11-01 DIAGNOSIS — N184 Chronic kidney disease, stage 4 (severe): Secondary | ICD-10-CM | POA: Diagnosis not present

## 2023-11-01 DIAGNOSIS — I69392 Facial weakness following cerebral infarction: Secondary | ICD-10-CM | POA: Diagnosis not present

## 2023-11-01 DIAGNOSIS — I69351 Hemiplegia and hemiparesis following cerebral infarction affecting right dominant side: Secondary | ICD-10-CM | POA: Diagnosis not present

## 2023-11-01 DIAGNOSIS — I130 Hypertensive heart and chronic kidney disease with heart failure and stage 1 through stage 4 chronic kidney disease, or unspecified chronic kidney disease: Secondary | ICD-10-CM | POA: Diagnosis not present

## 2023-11-05 ENCOUNTER — Telehealth: Payer: Self-pay

## 2023-11-05 ENCOUNTER — Encounter: Attending: Physical Medicine and Rehabilitation | Admitting: Registered Nurse

## 2023-11-05 VITALS — BP 144/73 | HR 79

## 2023-11-05 DIAGNOSIS — I130 Hypertensive heart and chronic kidney disease with heart failure and stage 1 through stage 4 chronic kidney disease, or unspecified chronic kidney disease: Secondary | ICD-10-CM | POA: Diagnosis not present

## 2023-11-05 DIAGNOSIS — I10 Essential (primary) hypertension: Secondary | ICD-10-CM | POA: Diagnosis not present

## 2023-11-05 DIAGNOSIS — I69351 Hemiplegia and hemiparesis following cerebral infarction affecting right dominant side: Secondary | ICD-10-CM | POA: Diagnosis not present

## 2023-11-05 DIAGNOSIS — I69392 Facial weakness following cerebral infarction: Secondary | ICD-10-CM | POA: Diagnosis not present

## 2023-11-05 DIAGNOSIS — I5042 Chronic combined systolic (congestive) and diastolic (congestive) heart failure: Secondary | ICD-10-CM | POA: Diagnosis not present

## 2023-11-05 DIAGNOSIS — Z8546 Personal history of malignant neoplasm of prostate: Secondary | ICD-10-CM | POA: Diagnosis not present

## 2023-11-05 DIAGNOSIS — E1122 Type 2 diabetes mellitus with diabetic chronic kidney disease: Secondary | ICD-10-CM | POA: Diagnosis not present

## 2023-11-05 DIAGNOSIS — R3914 Feeling of incomplete bladder emptying: Secondary | ICD-10-CM | POA: Diagnosis not present

## 2023-11-05 DIAGNOSIS — I639 Cerebral infarction, unspecified: Secondary | ICD-10-CM | POA: Insufficient documentation

## 2023-11-05 DIAGNOSIS — N35013 Post-traumatic anterior urethral stricture: Secondary | ICD-10-CM | POA: Diagnosis not present

## 2023-11-05 DIAGNOSIS — N184 Chronic kidney disease, stage 4 (severe): Secondary | ICD-10-CM | POA: Diagnosis not present

## 2023-11-05 MED ORDER — PROPRANOLOL HCL 10 MG PO TABS: 10.0000 mg | ORAL_TABLET | Freq: Once | ORAL | 0 refills | Status: DC

## 2023-11-05 MED ORDER — CARVEDILOL 3.125 MG PO TABS: 3.1250 mg | ORAL_TABLET | Freq: Two times a day (BID) | ORAL | 0 refills | Status: DC

## 2023-11-05 MED ORDER — GABAPENTIN 100 MG PO CAPS: 100.0000 mg | ORAL_CAPSULE | Freq: Every day | ORAL | 0 refills | Status: DC

## 2023-11-05 NOTE — Telephone Encounter (Signed)
 Called and spoke with patients daughter to make aware that medications refills have been approved and sent to pharmacy.  Daughter verbalized understanding

## 2023-11-05 NOTE — Progress Notes (Unsigned)
 Subjective:    Patient ID: Allen Dyer, male    DOB: [birth year], 88 y.o.   MRN: [id number]  HPI: Allen Dyer is a 88 y.o. male who returns for  hospital follow up appointment of his  Ischemic Cerebrovascular Accident and Essential Hypertension. He presented to [facility] ED on 09/02/2023 with complaints of progressive unsteady gait and falls.  Dr. Lou: H&P: 09/02/2023 HPI: Allen Dyer is a 87 y.o. male with medical history significant for chronic combined systolic and diastolic HF, nonischemic cardiomyopathy, CVA, prostate cancer s/p XRT, BPH, GERD, gout, multiple myeloma, nonobstructive CAD, HLD, HTN, T2DM, peripheral neuropathy and CKD stage IV who presented to the ED for evaluation of dizziness and a fall. Patient reports he has had dizziness for the past 2.5 weeks. He describes dizziness as both lightheadedness and spinning sensation. He presented to the ED on 5/3 and workup with labs, EKG and CT head were unremarkable. Patient was discharged home with meclizine  due to concern for peripheral vertigo. Reports his symptoms did not improve with the meclizine . Two days ago, while sitting on the side of his bed, the room started spinning and he fell off the bed hitting the front of his head on the floor. He braced himself with his right hand during the fall. He reports poor coordination and right wrist pain and swelling but denies any vision changes, headaches, focal weakness, numbness, tingling, chest pain, shortness of breath, palpitations, nausea, vomiting or abdominal pain. States he finished treatment for UTI about a week ago and has not had any further dysuria but does continue to have foul odor to his urine.   CT Head/ Cervical Spine IMPRESSION: 1. Hypodense lesions in the cerebellar hemispheres, measuring 3.4 x 3.2 cm on the right and 0.8 cm and 1.8 x 1.2 cm on the left. Please see forthcoming MR for further characterization. 2. No acute fracture or static subluxation of the cervical  spine. 3. Mild-to-moderate multilevel cervical disc degenerative disease throughout the cervical spine.  MR: Brain: MPRESSION: 1. Patchy early subacute ischemic infarcts involving the right greater than left cerebellar hemispheres. Associated minimal petechial blood products at the right cerebellum without hemorrhagic transformation or significant regional mass effect. 2. Irregular flow voids within the V4 segments bilaterally, which could reflect slow flow and/or occlusion. Correlation with dedicated vascular imaging recommended. 3. Underlying moderately advanced cerebral atrophy with mild chronic small vessel ischemic disease.  Neurology consulted and recommended: aspirin  and Plavix  x 3 months then aspirin  alone.   Allen Dyer was admitted to inpatient rehabilitation on 09/05/2023 and discharged home on 09/27/2023. He is receiving Home Health Therapy with [facility]. He denies any pain. He rates his pain 0. Also reports   Allen Dyer was readmitted to [facility] on 10/06/2023 and discharged 10/07/2023, discharge summary reviewed.   Daughter in room   Pain Inventory Average Pain 5 Pain Right Now 0 My pain is tingling  In the last 24 hours, has pain interfered with the following? General activity 9 Relation with others 9 Enjoyment of life 9 What TIME of day is your pain at its worst? night Sleep (in general) Fair  Pain is worse with: unsure Pain improves with: . Relief from Meds: 0  walk with assistance use a walker how many minutes can you walk? 5 ability to climb steps?  no do you drive?  no use a wheelchair needs help with transfers  retired I need assistance with the following:  feeding, dressing, bathing, toileting, meal prep, household  duties, and shopping  bladder control problems weakness numbness tingling trouble walking dizziness  Hospital f/u  Hospital f/u    Family History  Problem Relation Age of Onset   Cancer Mother     Congestive Heart Failure Father        died from heart failure at the age of 92   Cancer Sister    Congestive Heart Failure Brother        died from heart failure at the age of 66   Stroke Neg Hx    Social History   Socioeconomic History   Marital status: Widowed    Spouse name: Not on file   Number of children: 4   Years of education: Not on file   Highest education level: Not on file  Occupational History   Occupation: retired from post Electrical engineer  Tobacco Use   Smoking status: Former    Current packs/day: 0.00    Average packs/day: 2.0 packs/day for 10.0 years (20.0 ttl pk-yrs)    Types: Cigarettes    Start date: 1963    Quit date: 1973    Years since quitting: 52.5   Smokeless tobacco: Never  Vaping Use   Vaping status: Never Used  Substance and Sexual Activity   Alcohol use: Not Currently    Comment: quit in 1975, no h/o heavy use   Drug use: Never   Sexual activity: Not on file  Other Topics Concern   Not on file  Social History Narrative   Not on file   Social Drivers of Health   Financial Resource Strain: Not on file  Food Insecurity: No Food Insecurity (09/02/2023)   Hunger Vital Sign    Worried About Running Out of Food in the Last Year: Never true    Ran Out of Food in the Last Year: Never true  Transportation Needs: No Transportation Needs (09/02/2023)   PRAPARE - Administrator, Civil Service (Medical): No    Lack of Transportation (Non-Medical): No  Physical Activity: Not on file  Stress: Not on file  Social Connections: Moderately Integrated (09/02/2023)   Social Connection and Isolation Panel    Frequency of Communication with Friends and Family: More than three times a week    Frequency of Social Gatherings with Friends and Family: More than three times a week    Attends Religious Services: More than 4 times per year    Active Member of [employer] or Organizations: Yes    Attends Banker Meetings: More than 4 times per  year    Marital Status: Widowed   Past Surgical History:  Procedure Laterality Date   RIGHT HEART CATH N/A 07/10/2018   Procedure: RIGHT HEART CATH;  Surgeon: [name], MD;  Location: [facility] INVASIVE CV LAB;  Service: Cardiovascular;  Laterality: N/A;   RIGHT/LEFT HEART CATH AND CORONARY ANGIOGRAPHY N/A 06/18/2018   Procedure: RIGHT/LEFT HEART CATH AND CORONARY ANGIOGRAPHY;  Surgeon: Burnard Debby LABOR, MD;  Location: [facility] INVASIVE CV LAB;  Service: Cardiovascular;  Laterality: N/A;   Past Medical History:  Diagnosis Date   Chronic combined systolic and diastolic CHF (congestive heart failure) (HCC)    CKD (chronic kidney disease), stage III (HCC)    Hyperlipidemia    Hypertension    Lower extremity edema    Mild CAD    a. mild-mod by cath 05/2018.   Multiple myeloma (HCC) 11/15/2018   NICM (nonischemic cardiomyopathy) (HCC)    Peripheral neuropathy    Prostate  cancer [facility])    Status post XRT   Rheumatic fever    Stroke (HCC)    Trifascicular block    Type 2 diabetes mellitus (HCC)    Vertigo    BP (!) 144/73 (BP Location: Right Arm, Patient Position: Sitting, Cuff Size: Normal)   Pulse 79   SpO2 93%   Opioid Risk Score:   Fall Risk Score:  `1  Depression screen [facility] 2/9     08/16/2023    2:17 PM 07/26/2023   10:32 AM 10/24/2022    9:32 AM 07/26/2022    9:19 AM 02/10/2022    2:24 PM 11/17/2021   11:15 AM 08/15/2021    3:39 PM  Depression screen PHQ 2/9  Decreased Interest 0 0 0 0 0 0 0  Down, Depressed, Hopeless 0 0 0 0 0 0 0  PHQ - 2 Score 0 0 0 0 0 0 0     Review of Systems  Gastrointestinal:  Positive for constipation.  Musculoskeletal:  Positive for gait problem.       B/l hand pain  Neurological:  Positive for weakness and numbness.  All other systems reviewed and are negative.      Objective:   Physical Exam Vitals and nursing note reviewed.  Constitutional:      Appearance: Normal appearance.  Cardiovascular:     Rate and Rhythm: Normal rate and regular  rhythm.     Pulses: Normal pulses.     Heart sounds: Normal heart sounds.  Pulmonary:     Effort: Pulmonary effort is normal.     Breath sounds: Normal breath sounds.  Musculoskeletal:     Comments: Normal Muscle Bulk and Muscle Testing Reveals:  Upper Extremities: Full  ROM and Muscle Strength 5/5 Lower Extremities: Full ROM and Muscle Strength 5/5 Arrived in wheelchair      Skin:    General: Skin is warm and dry.  Neurological:     Mental Status: He is alert and oriented to person, place, and time.  Psychiatric:        Mood and Affect: Mood normal.        Behavior: Behavior normal.          Assessment & Plan:  Ischemic Cerebrovascular Accident: Continue Home Health Therapy with [facility]. Has a scheduled appointment with Neurology. Continue current medication regimen. Continue to Monitor.  2.  Essential Hypertension: Continue current medication regimen. PCP Following. Continue to Monitor.   F/U with Dr Lorilee in 4-6 weeks .

## 2023-11-06 ENCOUNTER — Encounter: Payer: Self-pay | Admitting: Registered Nurse

## 2023-11-06 DIAGNOSIS — I5042 Chronic combined systolic (congestive) and diastolic (congestive) heart failure: Secondary | ICD-10-CM | POA: Diagnosis not present

## 2023-11-06 DIAGNOSIS — I69351 Hemiplegia and hemiparesis following cerebral infarction affecting right dominant side: Secondary | ICD-10-CM | POA: Diagnosis not present

## 2023-11-06 DIAGNOSIS — E1122 Type 2 diabetes mellitus with diabetic chronic kidney disease: Secondary | ICD-10-CM | POA: Diagnosis not present

## 2023-11-06 DIAGNOSIS — N184 Chronic kidney disease, stage 4 (severe): Secondary | ICD-10-CM | POA: Diagnosis not present

## 2023-11-06 DIAGNOSIS — I130 Hypertensive heart and chronic kidney disease with heart failure and stage 1 through stage 4 chronic kidney disease, or unspecified chronic kidney disease: Secondary | ICD-10-CM | POA: Diagnosis not present

## 2023-11-06 DIAGNOSIS — I69392 Facial weakness following cerebral infarction: Secondary | ICD-10-CM | POA: Diagnosis not present

## 2023-11-07 ENCOUNTER — Ambulatory Visit (INDEPENDENT_AMBULATORY_CARE_PROVIDER_SITE_OTHER): Admitting: Podiatry

## 2023-11-07 ENCOUNTER — Encounter: Payer: Self-pay | Admitting: Podiatry

## 2023-11-07 DIAGNOSIS — I130 Hypertensive heart and chronic kidney disease with heart failure and stage 1 through stage 4 chronic kidney disease, or unspecified chronic kidney disease: Secondary | ICD-10-CM | POA: Diagnosis not present

## 2023-11-07 DIAGNOSIS — E1159 Type 2 diabetes mellitus with other circulatory complications: Secondary | ICD-10-CM

## 2023-11-07 DIAGNOSIS — I69392 Facial weakness following cerebral infarction: Secondary | ICD-10-CM | POA: Diagnosis not present

## 2023-11-07 DIAGNOSIS — E1122 Type 2 diabetes mellitus with diabetic chronic kidney disease: Secondary | ICD-10-CM | POA: Diagnosis not present

## 2023-11-07 DIAGNOSIS — N184 Chronic kidney disease, stage 4 (severe): Secondary | ICD-10-CM | POA: Diagnosis not present

## 2023-11-07 DIAGNOSIS — I69351 Hemiplegia and hemiparesis following cerebral infarction affecting right dominant side: Secondary | ICD-10-CM | POA: Diagnosis not present

## 2023-11-07 DIAGNOSIS — I5042 Chronic combined systolic (congestive) and diastolic (congestive) heart failure: Secondary | ICD-10-CM | POA: Diagnosis not present

## 2023-11-07 DIAGNOSIS — M79675 Pain in left toe(s): Secondary | ICD-10-CM

## 2023-11-07 DIAGNOSIS — M79674 Pain in right toe(s): Secondary | ICD-10-CM

## 2023-11-07 DIAGNOSIS — B351 Tinea unguium: Secondary | ICD-10-CM

## 2023-11-07 NOTE — Progress Notes (Signed)
 This patient returns to my office for at risk foot care.  This patient requires this care by a professional since this patient will be at risk due to having  Diabetes and chronic kidney disease.   This patient is unable to cut nails himself  since the patient cannot reach his  nails.These nails are painful walking and wearing shoes.   This patient presents for at risk foot care today.    General Appearance  Alert, conversant and in no acute stress.  Vascular  Dorsalis pedis and posterior tibial  pulses are palpable  bilaterally.  Capillary return is within normal limits  bilaterally. Temperature is within normal limits  bilaterally.  Neurologic  Senn-Weinstein monofilament wire test diminished   bilaterally. Muscle power within normal limits bilaterally.  Nails Thick disfigured discolored nails with subungual debris  from hallux to fifth toes bilaterally. No evidence of bacterial infection or drainage bilaterally.  Orthopedic  No limitations of motion  feet .  No crepitus or effusions noted.  No bony pathology or digital deformities noted.  Arthritis right foot/ankle. Capsulitis 5th met right foot.  Skin  normotropic skin with no porokeratosis noted bilaterally.  No signs of infections or ulcers noted.   Asymptomatic porokeratosis sub 5th  right   Onychomycosis  Pain in right toes  Pain in left toes   Consent was obtained for treatment procedures.   Mechanical debridement of nails 1-5  bilaterally performed with a nail nipper.  Filed with dremel without incident. No infection or ulcer.     Return office visit   3 months       Told patient to return for periodic foot care and evaluation due to potential at risk complications.   Cordella Bold DPM

## 2023-11-12 ENCOUNTER — Telehealth (HOSPITAL_COMMUNITY): Payer: Self-pay | Admitting: *Deleted

## 2023-11-12 DIAGNOSIS — N184 Chronic kidney disease, stage 4 (severe): Secondary | ICD-10-CM | POA: Diagnosis not present

## 2023-11-12 DIAGNOSIS — I5042 Chronic combined systolic (congestive) and diastolic (congestive) heart failure: Secondary | ICD-10-CM | POA: Diagnosis not present

## 2023-11-12 DIAGNOSIS — E1122 Type 2 diabetes mellitus with diabetic chronic kidney disease: Secondary | ICD-10-CM | POA: Diagnosis not present

## 2023-11-12 DIAGNOSIS — I130 Hypertensive heart and chronic kidney disease with heart failure and stage 1 through stage 4 chronic kidney disease, or unspecified chronic kidney disease: Secondary | ICD-10-CM | POA: Diagnosis not present

## 2023-11-12 DIAGNOSIS — I69392 Facial weakness following cerebral infarction: Secondary | ICD-10-CM | POA: Diagnosis not present

## 2023-11-12 DIAGNOSIS — I69351 Hemiplegia and hemiparesis following cerebral infarction affecting right dominant side: Secondary | ICD-10-CM | POA: Diagnosis not present

## 2023-11-12 NOTE — Telephone Encounter (Signed)
 Advanced Heart Failure Triage Encounter  Patient Name: Allen Dyer  Date of Call: 11/12/23  Problem:  Daughter called with some concerns, pt is also in the background on speaker phone. Pt recently d/c'd with CVA on ASA and Plavix  and was given heart monitor which will be returned today. Pt now c/o feeling like his R side of face/jaw is swollen for past 4 days, per daughter does not appear to be swollen. Denies numbness/tingling states pt is able to eat/drink ok with no issues with chewing, swallowing, or speaking. BP has been running high 140-160s, today 151/90 HR 66, )2 97%, temp 97.9, yest BP 147/79, saw PCP last week and was stable, is sch to f/u with neuro on 8/11  Pt is taking Carvedilol  3.125 mg BID, Imdur  15 mg Daily, Torsemide  30 mg Daily, and Propranolol  10 mg Daily  Plan:  Will send to provider for further recommendations  Powell Latino, RN

## 2023-11-12 NOTE — Telephone Encounter (Signed)
 Not sure what to make of that.  He will need followup in APP clinic to assess.

## 2023-11-12 NOTE — Telephone Encounter (Signed)
 Appt sch for 7/22

## 2023-11-12 NOTE — Progress Notes (Signed)
 [facility] PCP:  Regino Slater, MD  Primary Cardiologist:  Wilbert Bihari, MD Nephrology: Dr. Dennise SERUM HF Cardiologist: Dr Rolan   HPI: Allen Dyer is a 88 y.o. male with a history of DM, rheumatic fever, HTN, hyperlipidemia, CKD IV, prostate CA s/p XRT, systolic HF due to NICM (EF 30-35%) 05/2018, pulmonary venous HTN, and mild nonobstructive CAD.  He served for > 20 years in the [employer] including in [place].    Admitted 3/11-3/18/20 with volume overload. Diuresed with IV lasix . HF team consulted with concern for pulmonary HTN due to ongoing oxygen need. RHC completed and showed moderate pulmonary venous HTN with normal PVR 1.7 (no PAH).   Patient was diagnosed with smoldering myeloma in 7/20, no treatment planned at this time.   Cardiolite in 2/21 showed EF 36%, fixed anteroapical defect, no ischemia.  Echo in 11/21 showed EF 30-35%, diffuse hypokinesis, normal RV, PASP 45 mmHg, dilated IVC.   Seen in ED 08/05/21 with blurred vision. Felt to be lightheadedness vs vertigo. Orthostatics negative. He had PCP follow up and was diagnosed with vertigo.  CVA in 5/23, echo showed EF 35-40%, mild LVH, normal RV.  Carotid dopplers showed minimal disease.  MRA head showed severe vertebral artery disease bilaterally.   Admitted 5/25 with CVA. MRI showed subacute stroke. CT angiogram showed significant vertebral artery stenosis. Echo showed EF 25-30%, G1DD, normal RV. He was discharged to CIR. Re-admitted 6/25 with new CVA, as well as subacute strokes. Neuro recommending DAPT indefinitely, cardiac monitor placed at discharge.  Today he returns for an acute visit with complaints of right facial fullness/swelling. He is accompanied by his daughter, with whom he lives. He has had 2 recent hospitalizations for CVA. Facial sensation started 4 days ago, swallowing and breathing ok. Has right ear fullness, daughter says PCP checked ear and reported no abnormal findings. He has HH  PT through [facility] 2x/week, able to walk short distances with his walker. Has left sided weakness & dysarthria post CVA.  Has RLE swelling. Denies SOB, palpitations, abnormal bleeding, CP, dizziness, or PND/Orthopnea. Appetite ok. Not weighting at home. Not on Zetia , taking propranolol  + Coreg . Not wearing CPAP. Home BP 130-160s.  ECG (personally reviewed): SB with 1AVB & rBBB, 55 bpm  Labs (6/25): K 3.5, creatinine 1.65, LDL 73  PMH: 1. CKD: Stage 3.  2. Hyperlipidemia: Statin intolerant.  3. HTN 4. Smoldering myeloma: Diagnosed in 7/20.  - Bone marrow biopsy stained negative for amyloidosis.  5. Prostate cancer s/p XRT.  6. Type 2 diabetes.  7. Chronic systolic CHF: Nonischemic cardiomyopathy.  - RHC/LHC (2/20): Nonobstructive CAD with 50% pLAD; mean RA 13, PA 72/27 mean 47, mean PCWP 29, CI 2.8, PVR 3 WU - Echo (2/20): EF 30-35%.  - RHC (3/20): mean RA 9, PA 55/20 mean 32, mean PCWP 20, CI 3.29, PVR 1.7 WU - Echo (7/20): EF 25-30%, moderate LV dilation without LVH, normal RV size and systolic function, mild-moderate AI.  - Cardiolite (2/21): EF 36%, fixed anteroapical defect, no ischemia.  - Echo (11/21): EF 30-35%, diffuse hypokinesis, normal RV, PASP 45 mmHg, dilated IVC.  - Echo (3/23): EF 30-35%, moderately decreased LV with global HK, grade I DD, normal RV, ascending aorta dilation 44 mm. - Echo (5/23): EF 35-40%, mild LVH, normal RV - Echo 5/25: EF 25-30%, normal RV. 8. Pulmonary venous hypertension.  - V/Q scan (3/20): No evidence for chronic PE.  9. OSA: Using CPAP.  10. ABIs normal in  1/23 11. CVA: 5/23 and 6/25.  - Carotid dopplers (5/23): minimal disease.  - MRA head: no flow right vertebral, severe stenosis left vertebral.  - Carotid dopplers (6/25):  - MRA (6/25): Known bilateral vertebral artery occlusions. Unchanged advanced anterior circulation atherosclerosis including severe right and moderate left ICA stenoses.   Past Surgical History:  Procedure Laterality  Date   RIGHT HEART CATH N/A 07/10/2018   Procedure: RIGHT HEART CATH;  Surgeon: [name], MD;  Location: [facility] INVASIVE CV LAB;  Service: Cardiovascular;  Laterality: N/A;   RIGHT/LEFT HEART CATH AND CORONARY ANGIOGRAPHY N/A 06/18/2018   Procedure: RIGHT/LEFT HEART CATH AND CORONARY ANGIOGRAPHY;  Surgeon: Burnard Debby LABOR, MD;  Location: [facility] INVASIVE CV LAB;  Service: Cardiovascular;  Laterality: N/A;   Current Outpatient Medications  Medication Sig Dispense Refill   acetaminophen  (TYLENOL ) 325 MG tablet Take 2 tablets (650 mg total) by mouth every 4 (four) hours as needed for mild pain (pain score 1-3) (or temp > 37.5 C (99.5 F)).     Ascorbic Acid (VITAMIN C) 1000 MG tablet Take 4,000 mg by mouth every morning.     aspirin  EC 81 MG tablet Take 1 tablet (81 mg total) by mouth daily. Swallow whole. 30 tablet 2   Blood Glucose Monitoring Suppl (FREESTYLE LITE) w/Device KIT 1 Device by Does not apply route daily in the afternoon. 1 kit 0   carvedilol  (COREG ) 3.125 MG tablet Take 1 tablet (3.125 mg total) by mouth 2 (two) times daily with a meal. 60 tablet 0   clopidogrel  (PLAVIX ) 75 MG tablet Take 1 tablet (75 mg total) by mouth daily. 30 tablet 0   cyanocobalamin  1000 MCG tablet Take 1 tablet (1,000 mcg total) by mouth every morning. 30 tablet 0   diclofenac  Sodium (VOLTAREN ) 1 % GEL Apply 2 g topically 4 (four) times daily. 100 g 0   docusate sodium  (COLACE) 100 MG capsule Take 1 capsule (100 mg total) by mouth daily.     gabapentin  (NEURONTIN ) 100 MG capsule Take 1 capsule (100 mg total) by mouth at bedtime. 30 capsule 0   Garlic 1000 MG CAPS Take 1,000 mg by mouth every morning.     glipiZIDE  (GLUCOTROL ) 5 MG tablet Take by mouth daily before breakfast.     glucose blood (FREESTYLE LITE) test strip 1 each by Other route daily in the afternoon. Use as instructed 100 each 3   isosorbide  mononitrate (IMDUR ) 30 MG 24 hr tablet Take 0.5 tablets (15 mg total) by mouth every morning.     meclizine   (ANTIVERT ) 12.5 MG tablet Take 1 tablet (12.5 mg total) by mouth daily as needed for dizziness. 30 tablet 0   modafinil  (PROVIGIL ) 100 MG tablet Take 1 tablet (100 mg total) by mouth daily. 30 tablet 0   Omega-3 Fatty Acids (FISH OIL) 1200 MG CAPS Take 1,200 mg by mouth daily.     Oyster Shell Calcium  500 MG TABS Take 500 mg by mouth daily.     potassium chloride  SA (KLOR-CON  M) 20 MEQ tablet Take 1 tablet (20 mEq total) by mouth daily. 30 tablet 0   propranolol  (INDERAL ) 10 MG tablet Take 1 tablet (10 mg total) by mouth once for 1 dose. 1 tablet 0   scopolamine  (TRANSDERM-SCOP) 1 MG/3DAYS Place 1 patch (1.5 mg total) onto the skin every 3 (three) days. 10 patch 0   tamsulosin  (FLOMAX ) 0.4 MG CAPS capsule Take 1 capsule (0.4 mg total) by mouth 2 (two) times daily. 60 capsule  0   torsemide  (DEMADEX ) 10 MG tablet Take 3 tablets (30 mg total) by mouth daily.     No current facility-administered medications for this encounter.    Allergies:   Tramadol, Finasteride, Jardiance  [empagliflozin ], Lisinopril, Ciprofloxacin, Simvastatin, and Sulfa antibiotics   Social History:  The patient  reports that he quit smoking about 52 years ago. His smoking use included cigarettes. He started smoking about 62 years ago. He has a 20 pack-year smoking history. He has never used smokeless tobacco. He reports that he does not currently use alcohol. He reports that he does not use drugs.   Family History:  The patient's family history includes Cancer in his mother and sister; Congestive Heart Failure in his brother and father.   ROS:  Please see the history of present illness.   All other systems are personally reviewed and negative.   Wt Readings from Last 3 Encounters:  11/13/23 87.5 kg (193 lb)  10/06/23 91.2 kg (201 lb 1 oz)  09/27/23 91.2 kg (201 lb 1 oz)   BP 132/70   Pulse (!) 58   Ht 5' 7 (1.702 m)   Wt 87.5 kg (193 lb)   SpO2 97%   BMI 30.23 kg/m   Physical Exam General:  NAD. No resp  difficulty, arrived in [facility], elderly HEENT: Normal Neck: Supple. JVP 8-10 Cor: Regular rate & rhythm. No rubs, gallops or murmurs. Lungs: Clear, diminished in bases Abdomen: Soft, nontender, nondistended.  Extremities: No cyanosis, clubbing, rash, 1+ RLE edema Neuro: Alert & oriented x 3, moves all 4 extremities w/o difficulty. Affect pleasant. + mild dysarthria - Eyelids elevate symmetrically, sensation in-tact to light touch & symmetrical to face, tongue midline and palate elevated symmetrically.  Assessment & Plan: 1. Chronic systolic HF:  New diagnosis 05/2018, EF 30-35% by echo. Due to NICM. LHC 05/2018 with mild non-osbtructive CAD (50% proximal LAD stenosis). Has had high BP for a long time, but it has generally been controlled recently. Had palpitations in past, but very few PVCs on telemetry when in the hospital. Prior hx of ETOH abuse, but none in 40+ years. Did not receive chemotherapy with prior prostate cancer. QRS is wide on EKG, but it is RBBB.  He has multiple myeloma but he does not have ventricular wall thickening that would be suggestive of cardiac amyloidosis.  RHC 3/20 with mildly elevated filling pressures, CO preserved, pulmonary venous HTN. No PAH.  Echo in 7/20 showed that EF remains low at 25-30% with moderate LV dilation, no LVH, and normal RV.  Echo in 11/21 showed EF 30-35%, diffuse hypokinesis, normal RV, PASP 45 mmHg, dilated IVC. Echo (3/23) EF 30-35%, echo in 5/23 showed EF 35-40%, mild LVH, normal RV.  Echo 5/25 showed EF 25-30%, normal RV. NYHA II, functional status confounded by CVA and deficits. Volume mildly elevated on exam but not markedly so, ReDs 44%. Discordant with exam findings. - Increase torsemide  to 60 mg daily and KCL to 40 mg daily. BMET and BNP today, repeat BMET in 10-14 days. - Continue carvedilol  3.125 mg bid.   - Continue hydralazine  50 mg tid + Imdur  15 mg daily.  - Plan to add back low-dose eplerenone  after labs reviewed today. - No ACE/ARB/ARNI  with CKD stage 3.  - Off SGLT2i with yeast infection/frequent UTIs. - With advanced age and nonischemic etiology of cardiomyopathy, would avoid ICD.  He has RBBB so would not likely benefit from CRT.  2. CKD Stage IV: Last creatinine 1.65. BMET today. -  Follows with Nephrology - No SGLT2i with urinary retention/yeast infections 3. CAD: 50% pLAD on LHC in 2/20. Cardiolite in 2/21 showed no ischemia.  No chest pain. - Restart Zetia  10 mg daily. Does not tolerate statins. Last LDL 73 - Continue ASA 81.   4. Severe OSA: Continue CPAP.  - Reports adherence. 5. Pulmonary hypertension: He had pulmonary venous hypertension based on prior RHC.  6. Smoldering myeloma:  No plans for chemotherapy treatment. Sees Dr Lonn.  7. CVA: In 5/23.  He has severe vertebral artery disease.  New CVA 5/25 to bilateral cerebellar region and 6/25 to R pons. Neuro recommends goal BP < 180/105. He is currently wearing an event monitor. SB on ECG today. - Continue DAPT with ASA/Plavix  indefinitely per Neuro - Stop propranolol . - Has not tolerated statins.  - Restart Zetia  as above - He has new 4 day fullness sensation to R side of face. Per Neurologist's assessment on day of discharge, patient endorsed right facial numbness. I asked daughter to call Dr. Chancy office today for an earlier appt. May be related to dental vs inner ear issues or just sequelae of known CVA, but would like Neuro to weigh in especially in light of recent 2 new CVAs. Discussed with Dr. Rolan.   Follow up in 3 months with Dr. Rolan. Long discussion with Mr Wamble and patient's daughter today about plan.  Harlene HERO [place], [place]  11/13/2023

## 2023-11-13 ENCOUNTER — Ambulatory Visit (HOSPITAL_COMMUNITY): Payer: Self-pay | Admitting: Family Medicine

## 2023-11-13 ENCOUNTER — Ambulatory Visit (HOSPITAL_COMMUNITY)
Admission: RE | Admit: 2023-11-13 | Discharge: 2023-11-13 | Disposition: A | Discharge status: Home / Self Care | Source: Ambulatory Visit | Attending: Family Medicine | Admitting: Family Medicine

## 2023-11-13 ENCOUNTER — Encounter (HOSPITAL_COMMUNITY): Payer: Self-pay

## 2023-11-13 ENCOUNTER — Telehealth: Payer: Self-pay | Admitting: Diagnostic Neuroimaging

## 2023-11-13 VITALS — BP 132/70 | HR 58 | Ht 67.0 in | Wt 193.0 lb

## 2023-11-13 DIAGNOSIS — Z79899 Other long term (current) drug therapy: Secondary | ICD-10-CM | POA: Insufficient documentation

## 2023-11-13 DIAGNOSIS — Z8546 Personal history of malignant neoplasm of prostate: Secondary | ICD-10-CM | POA: Diagnosis not present

## 2023-11-13 DIAGNOSIS — I5022 Chronic systolic (congestive) heart failure: Secondary | ICD-10-CM | POA: Diagnosis not present

## 2023-11-13 DIAGNOSIS — I69351 Hemiplegia and hemiparesis following cerebral infarction affecting right dominant side: Secondary | ICD-10-CM | POA: Diagnosis not present

## 2023-11-13 DIAGNOSIS — I428 Other cardiomyopathies: Secondary | ICD-10-CM | POA: Diagnosis not present

## 2023-11-13 DIAGNOSIS — I4510 Unspecified right bundle-branch block: Secondary | ICD-10-CM | POA: Insufficient documentation

## 2023-11-13 DIAGNOSIS — I69354 Hemiplegia and hemiparesis following cerebral infarction affecting left non-dominant side: Secondary | ICD-10-CM | POA: Diagnosis not present

## 2023-11-13 DIAGNOSIS — I272 Pulmonary hypertension, unspecified: Secondary | ICD-10-CM | POA: Diagnosis not present

## 2023-11-13 DIAGNOSIS — C9000 Multiple myeloma not having achieved remission: Secondary | ICD-10-CM | POA: Insufficient documentation

## 2023-11-13 DIAGNOSIS — I130 Hypertensive heart and chronic kidney disease with heart failure and stage 1 through stage 4 chronic kidney disease, or unspecified chronic kidney disease: Secondary | ICD-10-CM | POA: Diagnosis not present

## 2023-11-13 DIAGNOSIS — I69392 Facial weakness following cerebral infarction: Secondary | ICD-10-CM | POA: Diagnosis not present

## 2023-11-13 DIAGNOSIS — E1122 Type 2 diabetes mellitus with diabetic chronic kidney disease: Secondary | ICD-10-CM | POA: Diagnosis not present

## 2023-11-13 DIAGNOSIS — Z7982 Long term (current) use of aspirin: Secondary | ICD-10-CM | POA: Insufficient documentation

## 2023-11-13 DIAGNOSIS — N184 Chronic kidney disease, stage 4 (severe): Secondary | ICD-10-CM | POA: Diagnosis not present

## 2023-11-13 DIAGNOSIS — G4733 Obstructive sleep apnea (adult) (pediatric): Secondary | ICD-10-CM | POA: Diagnosis not present

## 2023-11-13 DIAGNOSIS — I2510 Atherosclerotic heart disease of native coronary artery without angina pectoris: Secondary | ICD-10-CM | POA: Insufficient documentation

## 2023-11-13 DIAGNOSIS — M7989 Other specified soft tissue disorders: Secondary | ICD-10-CM | POA: Insufficient documentation

## 2023-11-13 DIAGNOSIS — I639 Cerebral infarction, unspecified: Secondary | ICD-10-CM

## 2023-11-13 DIAGNOSIS — Z87891 Personal history of nicotine dependence: Secondary | ICD-10-CM | POA: Diagnosis not present

## 2023-11-13 DIAGNOSIS — I440 Atrioventricular block, first degree: Secondary | ICD-10-CM | POA: Insufficient documentation

## 2023-11-13 DIAGNOSIS — N183 Chronic kidney disease, stage 3 unspecified: Secondary | ICD-10-CM | POA: Insufficient documentation

## 2023-11-13 DIAGNOSIS — I5042 Chronic combined systolic (congestive) and diastolic (congestive) heart failure: Secondary | ICD-10-CM | POA: Diagnosis not present

## 2023-11-13 DIAGNOSIS — Z7902 Long term (current) use of antithrombotics/antiplatelets: Secondary | ICD-10-CM | POA: Diagnosis not present

## 2023-11-13 LAB — BASIC METABOLIC PANEL WITH GFR
Anion gap: 10 (ref 5–15)
BUN: 12 mg/dL (ref 8–23)
CO2: 31 mmol/L (ref 22–32)
Calcium: 9.6 mg/dL (ref 8.9–10.3)
Chloride: 104 mmol/L (ref 98–111)
Creatinine, Ser: 1.88 mg/dL — ABNORMAL HIGH (ref 0.61–1.24)
GFR, Estimated: 34 mL/min — ABNORMAL LOW (ref >60)
Glucose, Bld: 85 mg/dL (ref 70–99)
Potassium: 3.9 mmol/L (ref 3.5–5.1)
Sodium: 145 mmol/L (ref 135–145)

## 2023-11-13 LAB — BRAIN NATRIURETIC PEPTIDE: B Natriuretic Peptide: 339.5 pg/mL — ABNORMAL HIGH (ref 0.0–100.0)

## 2023-11-13 MED ORDER — POTASSIUM CHLORIDE CRYS ER 20 MEQ PO TBCR: 40.0000 meq | EXTENDED_RELEASE_TABLET | Freq: Every day | ORAL | 0 refills | Status: DC

## 2023-11-13 MED ORDER — TORSEMIDE 20 MG PO TABS: 60.0000 mg | ORAL_TABLET | Freq: Every day | ORAL | Status: DC

## 2023-11-13 MED ORDER — EZETIMIBE 10 MG PO TABS: 10.0000 mg | ORAL_TABLET | Freq: Every day | ORAL | 3 refills | Status: DC

## 2023-11-13 NOTE — Patient Instructions (Addendum)
 Good to see you today!   INCREASE torsemide  to 40 mg daily  INCREASE Potassium to 40 meq ( 2 tablets) daily  STOP Propranolol   RESTART  Zetia  10 mg daily  Labs done today, your results will be available in MyChart, we will contact you for abnormal readings.  Your physician recommends that you schedule a follow-up appointment  3  months(October) Call office in August to schedule an appointment  If you have any questions or concerns before your next appointment please send us  a message through [place] or call our office at [phone].    TO LEAVE A MESSAGE FOR THE NURSE SELECT OPTION 2, PLEASE LEAVE A MESSAGE INCLUDING: YOUR NAME DATE OF BIRTH CALL BACK NUMBER REASON FOR CALL**this is important as we prioritize the call backs  YOU WILL RECEIVE A CALL BACK THE SAME DAY AS LONG AS YOU CALL BEFORE 4:00 PM At the [facility], you and your health needs are our priority. As part of our continuing mission to provide you with exceptional heart care, we have created designated Provider Care Teams. These Care Teams include your primary Cardiologist (physician) and Advanced Practice Providers (APPs- Physician Assistants and Nurse Practitioners) who all work together to provide you with the care you need, when you need it.   You may see any of the following providers on your designated Care Team at your next follow up: Dr Toribio Fuel Dr Ezra Shuck Dr. Ria Commander Dr. Morene Brownie Amy Lenetta, NP Caffie Shed, [place] [facility] [place], [place] Beckey Coe, NP [place] Lee, NP Ellouise Class, NP Tinnie Redman, PharmD Jaun Bash, PharmD   Please be sure to bring in all your medications bottles to every appointment.    Thank you for choosing [facility]

## 2023-11-13 NOTE — Telephone Encounter (Signed)
 Pt's daughter called wanting to speak to the nurse regarding the pt's appt he had today at the cardiologist. She states that the Cardiologist is wanting the pt to be seen sooner than scheduled appt. She also stated that the Cardiologist was teetering whether or not to admit the pt. Please advise.

## 2023-11-13 NOTE — Telephone Encounter (Signed)
 Spoke w/Pt daughter regarding cardiologist. Pt was at cardiologist office today and they had concern d/t Pt c/o numbness on Right side of face along with swelling and stating he felt pressure. Dtr. Stated Pt has no other deficits but did complain of having trouble hearing in his Right ear. Dtr stated cardiology office noted Pt has some right leg swelling but stated he is retaining fluid that is not visible. Dtr reported Pt has an appt with his dentist tomorrow and they will be taking x-rays and screening Pt for oral cancer. Discussed Pt seeing Dr. Margaret earlier than 12/03/23 but as of the time of the call there are no earlier appts available. Recommended dtr call our office after dental appt but meanwhile if Pt c/o numbness in extremities, slurred speech, or weakness on one side to call 911 for emergent evaluation. Dtr voiced understanding and thanks for the call back.

## 2023-11-14 ENCOUNTER — Ambulatory Visit: Attending: Cardiology

## 2023-11-14 DIAGNOSIS — I639 Cerebral infarction, unspecified: Secondary | ICD-10-CM

## 2023-11-14 DIAGNOSIS — I63543 Cerebral infarction due to unspecified occlusion or stenosis of bilateral cerebellar arteries: Secondary | ICD-10-CM

## 2023-11-15 DIAGNOSIS — I69392 Facial weakness following cerebral infarction: Secondary | ICD-10-CM | POA: Diagnosis not present

## 2023-11-15 DIAGNOSIS — N184 Chronic kidney disease, stage 4 (severe): Secondary | ICD-10-CM | POA: Diagnosis not present

## 2023-11-15 DIAGNOSIS — I69351 Hemiplegia and hemiparesis following cerebral infarction affecting right dominant side: Secondary | ICD-10-CM | POA: Diagnosis not present

## 2023-11-15 DIAGNOSIS — I5042 Chronic combined systolic (congestive) and diastolic (congestive) heart failure: Secondary | ICD-10-CM | POA: Diagnosis not present

## 2023-11-15 DIAGNOSIS — I130 Hypertensive heart and chronic kidney disease with heart failure and stage 1 through stage 4 chronic kidney disease, or unspecified chronic kidney disease: Secondary | ICD-10-CM | POA: Diagnosis not present

## 2023-11-15 DIAGNOSIS — E1122 Type 2 diabetes mellitus with diabetic chronic kidney disease: Secondary | ICD-10-CM | POA: Diagnosis not present

## 2023-11-15 MED ORDER — EPLERENONE 25 MG PO TABS: 12.5000 mg | ORAL_TABLET | Freq: Every day | ORAL | 11 refills | Status: DC

## 2023-11-18 ENCOUNTER — Ambulatory Visit (HOSPITAL_COMMUNITY): Payer: Self-pay | Admitting: Cardiology

## 2023-11-18 DIAGNOSIS — I639 Cerebral infarction, unspecified: Secondary | ICD-10-CM | POA: Diagnosis not present

## 2023-11-18 DIAGNOSIS — I63543 Cerebral infarction due to unspecified occlusion or stenosis of bilateral cerebellar arteries: Secondary | ICD-10-CM | POA: Diagnosis not present

## 2023-11-19 DIAGNOSIS — E1122 Type 2 diabetes mellitus with diabetic chronic kidney disease: Secondary | ICD-10-CM | POA: Diagnosis not present

## 2023-11-19 DIAGNOSIS — I69392 Facial weakness following cerebral infarction: Secondary | ICD-10-CM | POA: Diagnosis not present

## 2023-11-19 DIAGNOSIS — N184 Chronic kidney disease, stage 4 (severe): Secondary | ICD-10-CM | POA: Diagnosis not present

## 2023-11-19 DIAGNOSIS — I5042 Chronic combined systolic (congestive) and diastolic (congestive) heart failure: Secondary | ICD-10-CM | POA: Diagnosis not present

## 2023-11-19 DIAGNOSIS — I69351 Hemiplegia and hemiparesis following cerebral infarction affecting right dominant side: Secondary | ICD-10-CM | POA: Diagnosis not present

## 2023-11-19 DIAGNOSIS — I130 Hypertensive heart and chronic kidney disease with heart failure and stage 1 through stage 4 chronic kidney disease, or unspecified chronic kidney disease: Secondary | ICD-10-CM | POA: Diagnosis not present

## 2023-11-20 DIAGNOSIS — I69351 Hemiplegia and hemiparesis following cerebral infarction affecting right dominant side: Secondary | ICD-10-CM | POA: Diagnosis not present

## 2023-11-20 DIAGNOSIS — E1122 Type 2 diabetes mellitus with diabetic chronic kidney disease: Secondary | ICD-10-CM | POA: Diagnosis not present

## 2023-11-20 DIAGNOSIS — I130 Hypertensive heart and chronic kidney disease with heart failure and stage 1 through stage 4 chronic kidney disease, or unspecified chronic kidney disease: Secondary | ICD-10-CM | POA: Diagnosis not present

## 2023-11-20 DIAGNOSIS — I69392 Facial weakness following cerebral infarction: Secondary | ICD-10-CM | POA: Diagnosis not present

## 2023-11-20 DIAGNOSIS — I5042 Chronic combined systolic (congestive) and diastolic (congestive) heart failure: Secondary | ICD-10-CM | POA: Diagnosis not present

## 2023-11-20 DIAGNOSIS — N184 Chronic kidney disease, stage 4 (severe): Secondary | ICD-10-CM | POA: Diagnosis not present

## 2023-11-22 DIAGNOSIS — E1122 Type 2 diabetes mellitus with diabetic chronic kidney disease: Secondary | ICD-10-CM | POA: Diagnosis not present

## 2023-11-22 DIAGNOSIS — I130 Hypertensive heart and chronic kidney disease with heart failure and stage 1 through stage 4 chronic kidney disease, or unspecified chronic kidney disease: Secondary | ICD-10-CM | POA: Diagnosis not present

## 2023-11-22 DIAGNOSIS — I5042 Chronic combined systolic (congestive) and diastolic (congestive) heart failure: Secondary | ICD-10-CM | POA: Diagnosis not present

## 2023-11-22 DIAGNOSIS — I69392 Facial weakness following cerebral infarction: Secondary | ICD-10-CM | POA: Diagnosis not present

## 2023-11-22 DIAGNOSIS — N184 Chronic kidney disease, stage 4 (severe): Secondary | ICD-10-CM | POA: Diagnosis not present

## 2023-11-22 DIAGNOSIS — I69351 Hemiplegia and hemiparesis following cerebral infarction affecting right dominant side: Secondary | ICD-10-CM | POA: Diagnosis not present

## 2023-11-27 DIAGNOSIS — B961 Klebsiella pneumoniae [K. pneumoniae] as the cause of diseases classified elsewhere: Secondary | ICD-10-CM | POA: Diagnosis not present

## 2023-11-27 DIAGNOSIS — E1151 Type 2 diabetes mellitus with diabetic peripheral angiopathy without gangrene: Secondary | ICD-10-CM | POA: Diagnosis not present

## 2023-11-27 DIAGNOSIS — K219 Gastro-esophageal reflux disease without esophagitis: Secondary | ICD-10-CM | POA: Diagnosis not present

## 2023-11-27 DIAGNOSIS — R338 Other retention of urine: Secondary | ICD-10-CM | POA: Diagnosis not present

## 2023-11-27 DIAGNOSIS — G4733 Obstructive sleep apnea (adult) (pediatric): Secondary | ICD-10-CM | POA: Diagnosis not present

## 2023-11-27 DIAGNOSIS — E669 Obesity, unspecified: Secondary | ICD-10-CM | POA: Diagnosis not present

## 2023-11-27 DIAGNOSIS — M109 Gout, unspecified: Secondary | ICD-10-CM | POA: Diagnosis not present

## 2023-11-27 DIAGNOSIS — I2510 Atherosclerotic heart disease of native coronary artery without angina pectoris: Secondary | ICD-10-CM | POA: Diagnosis not present

## 2023-11-27 DIAGNOSIS — I453 Trifascicular block: Secondary | ICD-10-CM | POA: Diagnosis not present

## 2023-11-27 DIAGNOSIS — E1122 Type 2 diabetes mellitus with diabetic chronic kidney disease: Secondary | ICD-10-CM | POA: Diagnosis not present

## 2023-11-27 DIAGNOSIS — N184 Chronic kidney disease, stage 4 (severe): Secondary | ICD-10-CM | POA: Diagnosis not present

## 2023-11-27 DIAGNOSIS — N39498 Other specified urinary incontinence: Secondary | ICD-10-CM | POA: Diagnosis not present

## 2023-11-27 DIAGNOSIS — N401 Enlarged prostate with lower urinary tract symptoms: Secondary | ICD-10-CM | POA: Diagnosis not present

## 2023-11-27 DIAGNOSIS — I69351 Hemiplegia and hemiparesis following cerebral infarction affecting right dominant side: Secondary | ICD-10-CM | POA: Diagnosis not present

## 2023-11-27 DIAGNOSIS — K5900 Constipation, unspecified: Secondary | ICD-10-CM | POA: Diagnosis not present

## 2023-11-27 DIAGNOSIS — E1142 Type 2 diabetes mellitus with diabetic polyneuropathy: Secondary | ICD-10-CM | POA: Diagnosis not present

## 2023-11-27 DIAGNOSIS — E785 Hyperlipidemia, unspecified: Secondary | ICD-10-CM | POA: Diagnosis not present

## 2023-11-27 DIAGNOSIS — I428 Other cardiomyopathies: Secondary | ICD-10-CM | POA: Diagnosis not present

## 2023-11-27 DIAGNOSIS — N390 Urinary tract infection, site not specified: Secondary | ICD-10-CM | POA: Diagnosis not present

## 2023-11-27 DIAGNOSIS — I69392 Facial weakness following cerebral infarction: Secondary | ICD-10-CM | POA: Diagnosis not present

## 2023-11-27 DIAGNOSIS — I272 Pulmonary hypertension, unspecified: Secondary | ICD-10-CM | POA: Diagnosis not present

## 2023-11-27 DIAGNOSIS — I130 Hypertensive heart and chronic kidney disease with heart failure and stage 1 through stage 4 chronic kidney disease, or unspecified chronic kidney disease: Secondary | ICD-10-CM | POA: Diagnosis not present

## 2023-11-27 DIAGNOSIS — I5042 Chronic combined systolic (congestive) and diastolic (congestive) heart failure: Secondary | ICD-10-CM | POA: Diagnosis not present

## 2023-11-27 DIAGNOSIS — R1312 Dysphagia, oropharyngeal phase: Secondary | ICD-10-CM | POA: Diagnosis not present

## 2023-11-27 DIAGNOSIS — I471 Supraventricular tachycardia, unspecified: Secondary | ICD-10-CM | POA: Diagnosis not present

## 2023-11-27 NOTE — Progress Notes (Signed)
 [facility] Note   PCP:  Regino Slater, MD        Primary Cardiologist:  Wilbert Bihari, MD Nephrology: Dr. Dennise SERUM HF Cardiologist: Dr Rolan   HPI:  Allen Dyer is a 88 y.o. male with a history of DM, rheumatic fever, HTN, hyperlipidemia, CKD IV, prostate CA s/p XRT, systolic HF due to NICM (EF 30-35%) 05/2018, pulmonary venous HTN, and mild nonobstructive CAD.  He served for > 20 years in the [employer] including in [place].    Admitted 3/11-3/18/20 with volume overload. Diuresed with IV Lasix . HF team consulted with concern for pulmonary HTN due to ongoing oxygen need. RHC completed and showed moderate pulmonary venous HTN with normal PVR 1.7 (no PAH).    Patient was diagnosed with smoldering myeloma in 10/2018, no treatment planned at this time.    Cardiolite in 05/2019 showed EF 36%, fixed anteroapical defect, no ischemia.  Echo in 02/2020 showed EF 30-35%, diffuse hypokinesis, normal RV, PASP 45 mmHg, dilated IVC.    Seen in ED 08/05/21 with blurred vision. Felt to be lightheadedness vs vertigo. Orthostatics negative. He had PCP follow up and was diagnosed with vertigo.   CVA in 08/2021, echo showed EF 35-40%, mild LVH, normal RV.  Carotid dopplers showed minimal disease.  MRA head showed severe vertebral artery disease bilaterally.    Admitted 08/2023 with CVA. MRI showed subacute stroke. CT angiogram showed significant vertebral artery stenosis. Echo showed EF 25-30%, G1DD, normal RV. He was discharged to CIR. Re-admitted 09/2023 with new CVA, as well as subacute strokes. Neuro recommending DAPT indefinitely, cardiac monitor placed at discharge.   Returned for an acute visit with complaints of right facial fullness/swelling on 11/13/23. He was accompanied by his daughter, with whom he lives. He had had 2 recent hospitalizations for CVA. Facial sensation started 4 days ago, swallowing and breathing was ok. Had right ear fullness, daughter stated PCP checked ear  and reported no abnormal findings. He has HH PT through [facility] 2x/week, able to walk short distances with his walker. Noted left sided weakness & dysarthria post CVA.  Had RLE swelling. Denied SOB, palpitations, abnormal bleeding, CP, dizziness, or PND/Orthopnea. Appetite was ok. Not weighting at home. Not on Zetia , taking propranolol  + carvedilol . Not wearing CPAP. Home BP 130-160s. ECG: SB with 1AVB & rBBB, 55 bpm  Today he returns to HF clinic for pharmacist medication titration with his daughter. At last visit with APP, ReDs was 44%, so torsemide  increased to 40 mg daily (clinic note said torsemide  60 mg daily, but 40 mg daily was written on AVS and provided to patient) and KCL increased to 40 mEq daily. Additionally propranolol  was discontinued (remained on carvedilol ) and Zetia  was restarted. On 11/29/23 carvedilol  was increased to 6.25 mg BID due to 5% PVCs on cardiac event monitor. Overall he is feeling well today, notes he feels like his breathing has improved compared to last visit. Using wheelchair in clinic but able to use walker at home and able to get around the house ok. Main complaint is tooth abscess. Has seen the oral surgeon but cardiac clearance will be needed before can proceed. Daughter helps him check his BP at home. Has been variable 100-148/88-80, but states he mostly stays in the SBP range of 130-140s. Of note they always check BP before he has taken his medications. BP in clinic 120/72. Of note, neurology note states should avoid low BP (goal SBP 120-140). Weight at home 195.8 lbs  yesterday. He has been taking torsemide  40 mg daily as instructed last visit from his AVS. Of note, weight in clinic is up 5 lbs from last visit. Has 1-2+ bilateral LEE, which he and his daughter state is stable from last visit. ReDs today 31%, down from 44% last visit. No PND/orthopnea. He does wear compression stockings and tries to elevate legs.     HF Medications: Carvedilol  6.25 mg BID Imdur  15 mg  daily Torsemide  40 mg daily  Has the patient been experiencing any side effects to the medications prescribed?  no  Does the patient have any problems obtaining medications due to transportation or finances?   no  Understanding of regimen: good Understanding of indications: good Potential of compliance: excellent - daughter helps with his medications.  Patient understands to avoid NSAIDs. Patient understands to avoid decongestants.    Pertinent Lab Values: 11/13/23: Serum creatinine 1.88, BUN 12, Potassium 3.9, Sodium 145, BNP 339.5 BMET, BNP today pending  Vital Signs: Weight: 198.2 lbs (last clinic weight: 193 lbs) Blood pressure: 120/72  Heart rate: 66   Assessment/Plan: 1. Chronic systolic HF:  New diagnosis 05/2018, EF 30-35% by echo. Due to NICM. LHC 05/2018 with mild non-osbtructive CAD (50% proximal LAD stenosis). Has had high BP for a long time, but it has generally been controlled recently. Had palpitations in past, but very few PVCs on telemetry when in the hospital. Prior hx of ETOH abuse, but none in 40+ years. Did not receive chemotherapy with prior prostate cancer. QRS is wide on EKG, but it is RBBB.  He has multiple myeloma but he does not have ventricular wall thickening that would be suggestive of cardiac amyloidosis.  RHC 06/2018 with mildly elevated filling pressures, CO preserved, pulmonary venous HTN. No PAH.  Echo in 10/2018 showed that EF remained low at 25-30% with moderate LV dilation, no LVH, and normal RV.  Echo in 02/2020 showed EF 30-35%, diffuse hypokinesis, normal RV, PASP 45 mmHg, dilated IVC. Echo (06/2021) EF 30-35%, echo in 08/2021 showed EF 35-40%, mild LVH, normal RV.  Echo 08/2023 showed EF 25-30%, normal RV.  - NYHA II-III, functional status confounded by CVA and deficits. Volume status on exam difficult to assess. States breathing has improved and ReDS downtrending from last visit 44%>>31%. However, weight is up 5 lbs from last visit. Has 1-2+ bilateral LEE,  which family says is relatively stable but note left leg fluid fluctuates more than right leg (which is always swollen). Given conflicting markers, will draw BMET, BNP today and make changes to diuretics after labs return.  - Continue torsemide  40 mg daily and KCL 40 mEq daily. Can adjust after labs return if needed.  - Continue carvedilol  6.25 mg BID.   - Continue Imdur  15 mg daily. Is no longer taking hydralazine , this was discontinued.  - Can consider restarting eplerenone  in the future if renal function remains stable.  - No ACE/ARB/ARNI with CKD stage 3.  - Off SGLT2i with yeast infection/frequent UTIs. - Continue compression stockings - With advanced age and nonischemic etiology of cardiomyopathy, would avoid ICD.  He has RBBB so would not likely benefit from CRT.  2. CKD Stage IV: Last creatinine 1.88. BMET today pending.  - Follows with Nephrology - No SGLT2i with urinary retention/yeast infections 3. CAD: 50% pLAD on LHC in 05/2018. Cardiolite in 05/2019 showed no ischemia.  No chest pain. - Conitnue Zetia  10 mg daily. Does not tolerate statins. Last LDL 73 (10/07/23) - Continue ASA 81.  4. Severe OSA: Continue CPAP.  - Reports adherence. 5. Pulmonary hypertension: He had pulmonary venous hypertension based on prior RHC.  6. Smoldering myeloma:  No plans for chemotherapy treatment. Sees Dr Lonn.  7. CVA: First CVA in 08/2021.  He has severe vertebral artery disease.  New CVA 08/2023 to bilateral cerebellar region and 09/2023 to R pons. Neuro recommended goal BP < 180/105.  - Continue DAPT with ASA/Plavix  indefinitely per Neuro - Has not tolerated statins.  - Continue Zetia  as above - Per Neuro, need to avoid low BP (target goal SBP 120/140)     Follow up 2 months with Dr. Rolan Tinnie Redman, PharmD, BCPS, [facility], CPP Heart Failure Clinic Pharmacist [phone]

## 2023-11-28 DIAGNOSIS — I69392 Facial weakness following cerebral infarction: Secondary | ICD-10-CM | POA: Diagnosis not present

## 2023-11-28 DIAGNOSIS — I69351 Hemiplegia and hemiparesis following cerebral infarction affecting right dominant side: Secondary | ICD-10-CM | POA: Diagnosis not present

## 2023-11-28 DIAGNOSIS — I130 Hypertensive heart and chronic kidney disease with heart failure and stage 1 through stage 4 chronic kidney disease, or unspecified chronic kidney disease: Secondary | ICD-10-CM | POA: Diagnosis not present

## 2023-11-28 DIAGNOSIS — N184 Chronic kidney disease, stage 4 (severe): Secondary | ICD-10-CM | POA: Diagnosis not present

## 2023-11-28 DIAGNOSIS — I5042 Chronic combined systolic (congestive) and diastolic (congestive) heart failure: Secondary | ICD-10-CM | POA: Diagnosis not present

## 2023-11-28 DIAGNOSIS — E1122 Type 2 diabetes mellitus with diabetic chronic kidney disease: Secondary | ICD-10-CM | POA: Diagnosis not present

## 2023-11-29 DIAGNOSIS — I69392 Facial weakness following cerebral infarction: Secondary | ICD-10-CM | POA: Diagnosis not present

## 2023-11-29 DIAGNOSIS — I5042 Chronic combined systolic (congestive) and diastolic (congestive) heart failure: Secondary | ICD-10-CM | POA: Diagnosis not present

## 2023-11-29 DIAGNOSIS — I69351 Hemiplegia and hemiparesis following cerebral infarction affecting right dominant side: Secondary | ICD-10-CM | POA: Diagnosis not present

## 2023-11-29 DIAGNOSIS — E1122 Type 2 diabetes mellitus with diabetic chronic kidney disease: Secondary | ICD-10-CM | POA: Diagnosis not present

## 2023-11-29 DIAGNOSIS — I130 Hypertensive heart and chronic kidney disease with heart failure and stage 1 through stage 4 chronic kidney disease, or unspecified chronic kidney disease: Secondary | ICD-10-CM | POA: Diagnosis not present

## 2023-11-29 DIAGNOSIS — N184 Chronic kidney disease, stage 4 (severe): Secondary | ICD-10-CM | POA: Diagnosis not present

## 2023-11-29 MED ORDER — CARVEDILOL 6.25 MG PO TABS: 6.2500 mg | ORAL_TABLET | Freq: Two times a day (BID) | ORAL | 5 refills | Status: DC

## 2023-12-03 ENCOUNTER — Encounter: Payer: Self-pay | Admitting: Diagnostic Neuroimaging

## 2023-12-03 ENCOUNTER — Ambulatory Visit (INDEPENDENT_AMBULATORY_CARE_PROVIDER_SITE_OTHER): Admitting: Diagnostic Neuroimaging

## 2023-12-03 ENCOUNTER — Telehealth: Payer: Self-pay | Admitting: Diagnostic Neuroimaging

## 2023-12-03 ENCOUNTER — Inpatient Hospital Stay: Admitting: Diagnostic Neuroimaging

## 2023-12-03 VITALS — BP 138/80 | HR 74 | Ht 67.0 in | Wt 190.0 lb

## 2023-12-03 DIAGNOSIS — I6330 Cerebral infarction due to thrombosis of unspecified cerebral artery: Secondary | ICD-10-CM | POA: Diagnosis not present

## 2023-12-03 NOTE — Telephone Encounter (Signed)
 Pt wife called  informed that MD wants Pt to come in ay 1 pm . Appt Scheduled

## 2023-12-03 NOTE — Patient Instructions (Signed)
-  continue current medications

## 2023-12-03 NOTE — Telephone Encounter (Signed)
 LVM and sent mychart msg informing pt of need to reschedule 12/03/23 appt - MD out   If patient calls back I have a slot held for him with Dr Margaret for 8/11 at 1pm

## 2023-12-03 NOTE — Progress Notes (Signed)
 [facility]  PATIENT: Allen Dyer DOB: [birth year]  REFERRING CLINICIAN: Koirala, Dibas, MD HISTORY FROM: patient  REASON FOR VISIT: new consult   HISTORICAL  CHIEF COMPLAINT:  Chief Complaint  Patient presents with   Other    RM 7, Pt w/daughter and grandson. Pt here for stroke f/u. Pt states his face feels his numb on the inside. Pt states he has an abscessed tooth on right side.    HISTORY OF PRESENT ILLNESS:   UPDATE (12/03/23, VRP): Here for stroke follow up. Early May 2025 had onset of dizziness and gait diff. Went to ER on 08/25/23 for CT head which was negative. Had MRI 09/02/23 showing subacute cerebellar stroke. Admitted until 09/05/23, then transfer to inpatient rehab until 09/27/23. Readmitted 10/06/23 for numbness and tingling, and found to have addl small infarct in the right pons. Discharged on 10/08/23. Some ongoing dizziness and not feeling well. Stable in last couple weeks.   PRIOR HPI (07/31/22, VRP): 88 year old male here for evaluation of double vision.  History of hypertension, hyperlipidemia, diabetes, chronic kidney disease.  May 2023 patient presented to hospital for dizziness and vertigo.  Had MRI of the brain which showed acute to subacute bilateral cerebellar ischemic infarctions.  Patient was sent to the hospital for evaluation.  Stroke workup was completed.  He was treated with dual antiplatelet for 3 months and then aspirin  alone.  January 2024 patient had 1 episode of double vision lasting 30 seconds and another episode of triple vision lasting for few minutes.  The second event was associated with dizziness.  He also had some intermittent numbness on his right tongue and lip.  No further events since January 2024.  Blood pressure has been under good control, although sometimes his systolic blood pressure was 90's.    REVIEW OF SYSTEMS: Full 14 system review of systems performed and negative with exception of: as per  HPI.  ALLERGIES: Allergies  Allergen Reactions   Tramadol Other (See Comments)    Dizziness (intolerance) hallucinate   Finasteride Swelling    Breast enlargement    Jardiance  [Empagliflozin ]     Yeast infection   Lisinopril Cough   Ciprofloxacin Other (See Comments)    Dizziness (intolerance)    Simvastatin Hives and Other (See Comments)    Blisters/ Rash    Sulfa Antibiotics Rash    HOME MEDICATIONS: Outpatient Medications Prior to Visit  Medication Sig Dispense Refill   acetaminophen  (TYLENOL ) 325 MG tablet Take 2 tablets (650 mg total) by mouth every 4 (four) hours as needed for mild pain (pain score 1-3) (or temp > 37.5 C (99.5 F)).     Ascorbic Acid (VITAMIN C) 1000 MG tablet Take 4,000 mg by mouth every morning.     aspirin  EC 81 MG tablet Take 1 tablet (81 mg total) by mouth daily. Swallow whole. 30 tablet 2   Blood Glucose Monitoring Suppl (FREESTYLE LITE) w/Device KIT 1 Device by Does not apply route daily in the afternoon. 1 kit 0   clopidogrel  (PLAVIX ) 75 MG tablet Take 1 tablet (75 mg total) by mouth daily. 30 tablet 0   cyanocobalamin  1000 MCG tablet Take 1 tablet (1,000 mcg total) by mouth every morning. 30 tablet 0   docusate sodium  (COLACE) 100 MG capsule Take 1 capsule (100 mg total) by mouth daily.     eplerenone  (INSPRA ) 25 MG tablet Take 0.5 tablets (12.5 mg total) by mouth daily. 15 tablet 11   ezetimibe  (ZETIA ) 10 MG tablet Take 1  tablet (10 mg total) by mouth daily. 90 tablet 3   gabapentin  (NEURONTIN ) 100 MG capsule Take 1 capsule (100 mg total) by mouth at bedtime. 30 capsule 0   Garlic 1000 MG CAPS Take 1,000 mg by mouth every morning.     glipiZIDE  (GLUCOTROL ) 5 MG tablet Take by mouth daily before breakfast.     glucose blood (FREESTYLE LITE) test strip 1 each by Other route daily in the afternoon. Use as instructed 100 each 3   isosorbide  mononitrate (IMDUR ) 30 MG 24 hr tablet Take 0.5 tablets (15 mg total) by mouth every morning.     modafinil   (PROVIGIL ) 100 MG tablet Take 1 tablet (100 mg total) by mouth daily. 30 tablet 0   Omega-3 Fatty Acids (FISH OIL) 1200 MG CAPS Take 1,200 mg by mouth daily.     potassium chloride  SA (KLOR-CON  M) 20 MEQ tablet Take 2 tablets (40 mEq total) by mouth daily. 30 tablet 0   tamsulosin  (FLOMAX ) 0.4 MG CAPS capsule Take 1 capsule (0.4 mg total) by mouth 2 (two) times daily. 60 capsule 0   torsemide  (DEMADEX ) 20 MG tablet Take 3 tablets (60 mg total) by mouth daily. (Patient taking differently: Take 40 mg by mouth daily.)     carvedilol  (COREG ) 6.25 MG tablet Take 1 tablet (6.25 mg total) by mouth 2 (two) times daily with a meal. 60 tablet 5   diclofenac  Sodium (VOLTAREN ) 1 % GEL Apply 2 g topically 4 (four) times daily. 100 g 0   meclizine  (ANTIVERT ) 12.5 MG tablet Take 1 tablet (12.5 mg total) by mouth daily as needed for dizziness. 30 tablet 0   Oyster Shell Calcium  500 MG TABS Take 500 mg by mouth daily.     scopolamine  (TRANSDERM-SCOP) 1 MG/3DAYS Place 1 patch (1.5 mg total) onto the skin every 3 (three) days. 10 patch 0   No facility-administered medications prior to visit.    PAST MEDICAL HISTORY: Past Medical History:  Diagnosis Date   Chronic combined systolic and diastolic CHF (congestive heart failure) (HCC)    CKD (chronic kidney disease), stage III (HCC)    Hyperlipidemia    Hypertension    Lower extremity edema    Mild CAD    a. mild-mod by cath 05/2018.   Multiple myeloma (HCC) 11/15/2018   NICM (nonischemic cardiomyopathy) (HCC)    Peripheral neuropathy    Prostate cancer (HCC)    Status post XRT   Rheumatic fever    Stroke (HCC)    Trifascicular block    Type 2 diabetes mellitus (HCC)    Vertigo     PAST SURGICAL HISTORY: Past Surgical History:  Procedure Laterality Date   RIGHT HEART CATH N/A 07/10/2018   Procedure: RIGHT HEART CATH;  Surgeon: [name], MD;  [facility] INVASIVE CV LAB;  Service: Cardiovascular;  Laterality: N/A;   RIGHT/LEFT HEART CATH AND  CORONARY ANGIOGRAPHY N/A 06/18/2018   Procedure: RIGHT/LEFT HEART CATH AND CORONARY ANGIOGRAPHY;  Surgeon: Burnard Debby LABOR, MD;  Location: [facility] INVASIVE CV LAB;  Service: Cardiovascular;  Laterality: N/A;    FAMILY HISTORY: Family History  Problem Relation Age of Onset   Cancer Mother    Congestive Heart Failure Father        died from heart failure at the age of 93   Cancer Sister    Congestive Heart Failure Brother        died from heart failure at the age of 51   Stroke Neg Hx  SOCIAL HISTORY: Social History   Socioeconomic History   Marital status: Widowed    Spouse name: Not on file   Number of children: 4   Years of education: Not on file   Highest education level: Not on file  Occupational History   Occupation: retired from post Electrical engineer  Tobacco Use   Smoking status: Former    Current packs/day: 0.00    Average packs/day: 2.0 packs/day for 10.0 years (20.0 ttl pk-yrs)    Types: Cigarettes    Start date: 1963    Quit date: 1973    Years since quitting: 52.6   Smokeless tobacco: Never  Vaping Use   Vaping status: Never Used  Substance and Sexual Activity   Alcohol use: Not Currently    Comment: quit in 1975, no h/o heavy use   Drug use: Never   Sexual activity: Not on file  Other Topics Concern   Not on file  Social History Narrative   Not on file   Social Drivers of Health   Financial Resource Strain: Not on file  Food Insecurity: No Food Insecurity (09/02/2023)   Hunger Vital Sign    Worried About Running Out of Food in the Last Year: Never true    Ran Out of Food in the Last Year: Never true  Transportation Needs: No Transportation Needs (09/02/2023)   PRAPARE - Administrator, Civil Service (Medical): No    Lack of Transportation (Non-Medical): No  Physical Activity: Not on file  Stress: Not on file  Social Connections: Moderately Integrated (09/02/2023)   Social Connection and Isolation Panel    Frequency of Communication  with Friends and Family: More than three times a week    Frequency of Social Gatherings with Friends and Family: More than three times a week    Attends Religious Services: More than 4 times per year    Active Member of [employer] or Organizations: Yes    Attends Banker Meetings: More than 4 times per year    Marital Status: Widowed  Intimate Partner Violence: Not At Risk (09/02/2023)   Humiliation, Afraid, Rape, and Kick questionnaire    Fear of Current or Ex-Partner: No    Emotionally Abused: No    Physically Abused: No    Sexually Abused: No     PHYSICAL EXAM  GENERAL EXAM/CONSTITUTIONAL: Vitals:  Vitals:   12/03/23 1313  BP: 138/80  Pulse: 74  Weight: 190 lb (86.2 kg)  Height: 5' 7 (1.702 m)   Body mass index is 29.76 kg/m. Wt Readings from Last 3 Encounters:  12/03/23 190 lb (86.2 kg)  11/13/23 193 lb (87.5 kg)  10/06/23 201 lb 1 oz (91.2 kg)   Patient is in no distress; well developed, nourished and groomed; neck is supple  CARDIOVASCULAR: Examination of carotid arteries is normal; no carotid bruits Regular rate and rhythm, no murmurs Examination of peripheral vascular system by observation and palpation is normal  EYES: Ophthalmoscopic exam of optic discs and posterior segments is normal; no papilledema or hemorrhages No results found.   MUSCULOSKELETAL: Gait, strength, tone, movements noted in Neurologic exam below  NEUROLOGIC: MENTAL STATUS:      No data to display         awake, alert, oriented to person, place and time recent and remote memory intact normal attention and concentration language fluent, comprehension intact, naming intact fund of knowledge appropriate  CRANIAL NERVE:  2nd - no papilledema on fundoscopic exam 2nd, 3rd,  4th, 6th - pupils equal and reactive to light, visual fields full to confrontation, extraocular muscles intact, no nystagmus 5th - facial sensation symmetric 7th - facial strength symmetric 8th -  hearing intact 9th - palate elevates symmetrically, uvula midline 11th - shoulder shrug symmetric 12th - tongue protrusion midline MILD-MODERATE DYSARTHRIA  MOTOR:  normal bulk and tone, full strength in the BUE, BLE; LEFT LEG PROX 4/5  SENSORY:  normal and symmetric to light touch, temperature, vibration  COORDINATION:  finger-nose-finger, fine finger movements normal  REFLEXES:  deep tendon reflexes TRACE and symmetric  GAIT/STATION:  IN WHEEL CHAIR     DIAGNOSTIC DATA (LABS, IMAGING, TESTING) - I reviewed patient records, labs, notes, testing and imaging myself where available.  Lab Results  Component Value Date   WBC 4.9 10/07/2023   HGB 11.5 (L) 10/07/2023   HCT 35.4 (L) 10/07/2023   MCV 99.4 10/07/2023   PLT 135 (L) 10/07/2023      Component Value Date/Time   NA 145 11/13/2023 1114   NA 143 07/01/2018 1233   K 3.9 11/13/2023 1114   CL 104 11/13/2023 1114   CO2 31 11/13/2023 1114   GLUCOSE 85 11/13/2023 1114   BUN 12 11/13/2023 1114   BUN 27 07/01/2018 1233   CREATININE 1.88 (H) 11/13/2023 1114   CREATININE 2.06 (H) 10/10/2018 1404   CALCIUM  9.6 11/13/2023 1114   PROT 6.9 10/06/2023 0612   ALBUMIN  3.0 (L) 10/06/2023 0612   AST 20 10/06/2023 0612   AST 20 10/10/2018 1404   ALT 19 10/06/2023 0612   ALT 16 10/10/2018 1404   ALKPHOS 70 10/06/2023 0612   BILITOT 0.6 10/06/2023 0612   BILITOT 0.4 10/10/2018 1404   GFRNONAA 34 (L) 11/13/2023 1114   GFRNONAA 29 (L) 10/10/2018 1404   GFRAA 26 (L) 12/02/2019 1108   GFRAA 34 (L) 10/10/2018 1404   Lab Results  Component Value Date   CHOL 126 10/07/2023   HDL 38 (L) 10/07/2023   LDLCALC 73 10/07/2023   TRIG 75 10/07/2023   CHOLHDL 3.3 10/07/2023   Lab Results  Component Value Date   HGBA1C 7.0 (H) 10/07/2023   Lab Results  Component Value Date   VITAMINB12 883 09/09/2023   Lab Results  Component Value Date   TSH 2.615 09/09/2023   07/31/22 AchR < 0.03  07/31/22 MuSK < 1.0  09/12/21 TTE 1. Left  ventricular ejection fraction, by estimation, is 35 to 40%. The  left ventricle has moderately decreased function. The left ventricle  demonstrates global hypokinesis. There is mild concentric left ventricular  hypertrophy. Left ventricular  diastolic parameters are consistent with Grade I diastolic dysfunction  (impaired relaxation).   2. Right ventricular systolic function is normal. The right ventricular  size is normal.   3. Left atrial size was mildly dilated.   4. The mitral valve is grossly normal. Mild mitral valve regurgitation.   5. The aortic valve is tricuspid. Aortic valve regurgitation is mild.  Aortic valve sclerosis/calcification is present, without any evidence of  aortic stenosis.   6. Aortic dilatation noted. There is mild dilatation of the ascending  aorta, measuring 45 mm.   7. The inferior vena cava is normal in size with greater than 50%  respiratory variability, suggesting right atrial pressure of 3 mmHg.   Comparison(s): Compared to TTE on 06/2021, the LVEF appears slightly  better at 35-40% (previously 30-35%).   06/06/22 MRI brain 1. No acute finding. Numerous old infarctions in the cerebellum and right  occipital lobe which were acute in May of 2023. Chronic small-vessel ischemic changes of the pons and cerebral hemispheric white matter also present. 2. No visible flow in the right vertebral artery seen at the foramen magnum level. This is a chronic finding.  09/11/21 MRA head  Intracranial right vertebral artery shows minimal flow only distally, which is probably retrograde.   High-grade stenosis of the distal left vertebral artery.   Atherosclerosis of the basilar with up to moderate stenosis.   Bilateral intracranial ICA atherosclerosis with up to moderate stenosis.  09/13/21 MRA neck 1. No flow seen in the right vertebral artery throughout the neck. 2. Diminished flow in the partially covered left V4 segment associated with a severe stenosis seen  by prior intracranial MRA. 3. No flow reducing stenosis or ulceration seen in the tortuous cervical carotids.  09/02/23 MRI brain  1. Patchy early subacute ischemic infarcts involving the right greater than left cerebellar hemispheres. Associated minimal petechial blood products at the right cerebellum without hemorrhagic transformation or significant regional mass effect. 2. Irregular flow voids within the V4 segments bilaterally, which could reflect slow flow and/or occlusion. Correlation with dedicated vascular imaging recommended. 3. Underlying moderately advanced cerebral atrophy with mild chronic small vessel ischemic disease.  10/06/23 MRI brain  1. Acute nonhemorrhagic infarct in the right posterolateral pons. No other acute infarcts. 2. Multiple remote lacunar infarcts in the cerebellum bilaterally, left paramedian pons, and posterior right basal ganglia. 3. Evolving signal changes in the cerebellar peduncles bilaterally are consistent with a now subacute ischemic event. 4. Remote cortical infarct in the right occipital pole. 5. Multiple punctate foci of remote hemorrhage in the left cerebellar peduncle. No acute hemorrhage. 6. Mild atrophy and periventricular white matter change within normal limits for age.  09/03/23 TTE 1. Left ventricular ejection fraction, by estimation, is 25 to 30%. Left  ventricular ejection fraction by 2D MOD biplane is 30.8 %. The left  ventricle has severely decreased function. The left ventricle demonstrates  global hypokinesis. There is moderate   concentric left ventricular hypertrophy. Left ventricular diastolic  parameters are consistent with Grade I diastolic dysfunction (impaired  relaxation).      ASSESSMENT AND PLAN  88 y.o. year old male here with:   Dx:  1. Cerebrovascular accident (CVA) due to thrombosis of cerebral artery (HCC)      PLAN:  RECURRENT STROKES / INTRACRANIAL ATHEROSCLEROSIS / SMALL VESSEL DISEASE  (hypertension, diabetes, hyperlipidemia; remote tobacco use) -continue medical mgmt (continue aspirin  81, clopidogrel  75, BP control, diabetes control, statin) - avoid low BP (goal SBP 120-140)  Return for return to PCP.    EDUARD FABIENE HANLON, MD 12/03/2023, 1:54 PM Certified in Neurology, Neurophysiology and Neuroimaging  [facility] [address] [place], [place] [address] [phone]

## 2023-12-04 DIAGNOSIS — I69351 Hemiplegia and hemiparesis following cerebral infarction affecting right dominant side: Secondary | ICD-10-CM | POA: Diagnosis not present

## 2023-12-04 DIAGNOSIS — I69392 Facial weakness following cerebral infarction: Secondary | ICD-10-CM | POA: Diagnosis not present

## 2023-12-04 DIAGNOSIS — I130 Hypertensive heart and chronic kidney disease with heart failure and stage 1 through stage 4 chronic kidney disease, or unspecified chronic kidney disease: Secondary | ICD-10-CM | POA: Diagnosis not present

## 2023-12-04 DIAGNOSIS — I5042 Chronic combined systolic (congestive) and diastolic (congestive) heart failure: Secondary | ICD-10-CM | POA: Diagnosis not present

## 2023-12-04 DIAGNOSIS — N184 Chronic kidney disease, stage 4 (severe): Secondary | ICD-10-CM | POA: Diagnosis not present

## 2023-12-04 DIAGNOSIS — E1122 Type 2 diabetes mellitus with diabetic chronic kidney disease: Secondary | ICD-10-CM | POA: Diagnosis not present

## 2023-12-05 DIAGNOSIS — I5042 Chronic combined systolic (congestive) and diastolic (congestive) heart failure: Secondary | ICD-10-CM | POA: Diagnosis not present

## 2023-12-05 DIAGNOSIS — I69392 Facial weakness following cerebral infarction: Secondary | ICD-10-CM | POA: Diagnosis not present

## 2023-12-05 DIAGNOSIS — I130 Hypertensive heart and chronic kidney disease with heart failure and stage 1 through stage 4 chronic kidney disease, or unspecified chronic kidney disease: Secondary | ICD-10-CM | POA: Diagnosis not present

## 2023-12-05 DIAGNOSIS — I69351 Hemiplegia and hemiparesis following cerebral infarction affecting right dominant side: Secondary | ICD-10-CM | POA: Diagnosis not present

## 2023-12-05 DIAGNOSIS — E1122 Type 2 diabetes mellitus with diabetic chronic kidney disease: Secondary | ICD-10-CM | POA: Diagnosis not present

## 2023-12-05 DIAGNOSIS — N184 Chronic kidney disease, stage 4 (severe): Secondary | ICD-10-CM | POA: Diagnosis not present

## 2023-12-07 ENCOUNTER — Encounter: Attending: Physical Medicine and Rehabilitation | Admitting: Physical Medicine and Rehabilitation

## 2023-12-07 VITALS — BP 133/67 | HR 72 | Ht 67.0 in | Wt 192.0 lb

## 2023-12-07 DIAGNOSIS — I509 Heart failure, unspecified: Secondary | ICD-10-CM | POA: Diagnosis not present

## 2023-12-07 DIAGNOSIS — I69351 Hemiplegia and hemiparesis following cerebral infarction affecting right dominant side: Secondary | ICD-10-CM | POA: Diagnosis not present

## 2023-12-07 DIAGNOSIS — K047 Periapical abscess without sinus: Secondary | ICD-10-CM | POA: Diagnosis not present

## 2023-12-07 DIAGNOSIS — I130 Hypertensive heart and chronic kidney disease with heart failure and stage 1 through stage 4 chronic kidney disease, or unspecified chronic kidney disease: Secondary | ICD-10-CM | POA: Diagnosis not present

## 2023-12-07 DIAGNOSIS — I5042 Chronic combined systolic (congestive) and diastolic (congestive) heart failure: Secondary | ICD-10-CM | POA: Diagnosis not present

## 2023-12-07 DIAGNOSIS — E1122 Type 2 diabetes mellitus with diabetic chronic kidney disease: Secondary | ICD-10-CM | POA: Diagnosis not present

## 2023-12-07 DIAGNOSIS — I69392 Facial weakness following cerebral infarction: Secondary | ICD-10-CM | POA: Diagnosis not present

## 2023-12-07 DIAGNOSIS — N184 Chronic kidney disease, stage 4 (severe): Secondary | ICD-10-CM | POA: Diagnosis not present

## 2023-12-07 MED ORDER — SCOPOLAMINE 1 MG/3DAYS TD PT72: 1.0000 | MEDICATED_PATCH | TRANSDERMAL | 12 refills | Status: DC

## 2023-12-07 NOTE — Patient Instructions (Addendum)
 Eating a lot of fruits/vegetables   Wild blueberries  -Advised checking BP daily at home and logging results to bring into follow-up appointment with PCP and myself. -Reviewed BP meds today.  -Advised regarding healthy foods that can help lower blood pressure and provided with a list: 1) citrus foods- high in vitamins and minerals 2) salmon and other fatty fish - reduces inflammation and oxylipins 3) swiss chard (leafy green)- high level of nitrates 4) pumpkin seeds- one of the best natural sources of magnesium 5) Beans and lentils- high in fiber, magnesium, and potassium 6) Berries- high in flavonoids 7) Amaranth (whole grain, can be cooked similarly to rice and oats)- high in magnesium and fiber 8) Pistachios- even more effective at reducing BP than other nuts 9) Carrots- high in phenolic compounds that relax blood vessels and reduce inflammation 10) Celery- contain phthalides that relax tissues of arterial walls 11) Tomatoes- can also improve cholesterol and reduce risk of heart disease 12) Broccoli- good source of magnesium, calcium , and potassium 13) Greek yogurt: high in potassium and calcium  14) Herbs and spices: Celery seed, cilantro, saffron, lemongrass, black cumin, ginseng, cinnamon, cardamom, sweet basil, and ginger 15) Chia and flax seeds- also help to lower cholesterol and blood sugar 16) Beets- high levels of nitrates that relax blood vessels  17) spinach and bananas- high in potassium  -Provided lise of supplements that can help with hypertension:  1) magnesium: one high quality brand is Bioptemizers since it contains all 7 types of magnesium, otherwise over the counter magnesium gluconate 400mg  is a good option 2) B vitamins 3) vitamin D  4) potassium 5) CoQ10 6) L-arginine 7) Vitamin C 8) Beetroot -Educated that goal BP is 120/80. -Made goal to incorporate some of the above foods into diet.

## 2023-12-07 NOTE — Progress Notes (Signed)
 Subjective:    Patient ID: Allen Dyer, male    DOB: 11-23-1935, 88 y.o.   MRN: [id number]  HPI: Allen Dyer is a 88 y.o. male who returns for  hospital follow up appointment of his  Ischemic Cerebrovascular Accident and Essential Hypertension. He presented to [facility] ED on 09/02/2023 with complaints of progressive unsteady gait and falls.  Dr. Lou: H&P: 09/02/2023 HPI: Allen Dyer is a 88 y.o. male with medical history significant for chronic combined systolic and diastolic HF, nonischemic cardiomyopathy, CVA, prostate cancer s/p XRT, BPH, GERD, gout, multiple myeloma, nonobstructive CAD, HLD, HTN, T2DM, peripheral neuropathy and CKD stage IV who presented to the ED for evaluation of dizziness and a fall. Patient reports he has had dizziness for the past 2.5 weeks. He describes dizziness as both lightheadedness and spinning sensation. He presented to the ED on 5/3 and workup with labs, EKG and CT head were unremarkable. Patient was discharged home with meclizine  due to concern for peripheral vertigo. Reports his symptoms did not improve with the meclizine . Two days ago, while sitting on the side of his bed, the room started spinning and he fell off the bed hitting the front of his head on the floor. He braced himself with his right hand during the fall. He reports poor coordination and right wrist pain and swelling but denies any vision changes, headaches, focal weakness, numbness, tingling, chest pain, shortness of breath, palpitations, nausea, vomiting or abdominal pain. States he finished treatment for UTI about a week ago and has not had any further dysuria but does continue to have foul odor to his urine.   CT Head/ Cervical Spine IMPRESSION: 1. Hypodense lesions in the cerebellar hemispheres, measuring 3.4 x 3.2 cm on the right and 0.8 cm and 1.8 x 1.2 cm on the left. Please see forthcoming MR for further characterization. 2. No acute fracture or static subluxation of the cervical  spine. 3. Mild-to-moderate multilevel cervical disc degenerative disease throughout the cervical spine.  MR: Brain: MPRESSION: 1. Patchy early subacute ischemic infarcts involving the right greater than left cerebellar hemispheres. Associated minimal petechial blood products at the right cerebellum without hemorrhagic transformation or significant regional mass effect. 2. Irregular flow voids within the V4 segments bilaterally, which could reflect slow flow and/or occlusion. Correlation with dedicated vascular imaging recommended. 3. Underlying moderately advanced cerebral atrophy with mild chronic small vessel ischemic disease.  Neurology consulted and recommended: aspirin  and Plavix  x 3 months then aspirin  alone.   [name] was admitted to inpatient rehabilitation on 09/05/2023 and discharged home on 09/27/2023. He is receiving Home Health Therapy with [facility]. He denies any pain. He rates his pain 0. Also reports   [name] was readmitted to [facility] on 10/06/2023 and discharged 10/07/2023, discharge summary reviewed.   Daughter in room  1) Tooth abscess: -he was started on antibiotic -he has pain -he is going to follow with cardiologist for clearance   2) CHF: -he is on gabapentin  -he has follow-up with cardiologist   Pain Inventory Average Pain 0 Pain Right Now 0 My pain is .  In the last 24 hours, has pain interfered with the following? General activity 0 Relation with others 0 Enjoyment of life 0 What TIME of day is your pain at its worst? night and varies Sleep (in general) Fair  Pain is worse with: unsure Pain improves with: . Relief from Meds: 0     Family History  Problem Relation Age of Onset  Cancer Mother    Congestive Heart Failure Father        died from heart failure at the age of 41   Cancer Sister    Congestive Heart Failure Brother        died from heart failure at the age of 51   Stroke Neg Hx    Social  History   Socioeconomic History   Marital status: Widowed    Spouse name: Not on file   Number of children: 4   Years of education: Not on file   Highest education level: Not on file  Occupational History   Occupation: retired from post Electrical engineer  Tobacco Use   Smoking status: Former    Current packs/day: 0.00    Average packs/day: 2.0 packs/day for 10.0 years (20.0 ttl pk-yrs)    Types: Cigarettes    Start date: 1963    Quit date: 1973    Years since quitting: 52.6   Smokeless tobacco: Never  Vaping Use   Vaping status: Never Used  Substance and Sexual Activity   Alcohol use: Not Currently    Comment: quit in 1975, no h/o heavy use   Drug use: Never   Sexual activity: Not on file  Other Topics Concern   Not on file  Social History Narrative   Not on file   Social Drivers of Health   Financial Resource Strain: Not on file  Food Insecurity: No Food Insecurity (09/02/2023)   Hunger Vital Sign    Worried About Running Out of Food in the Last Year: Never true    Ran Out of Food in the Last Year: Never true  Transportation Needs: No Transportation Needs (09/02/2023)   PRAPARE - Administrator, Civil Service (Medical): No    Lack of Transportation (Non-Medical): No  Physical Activity: Not on file  Stress: Not on file  Social Connections: Moderately Integrated (09/02/2023)   Social Connection and Isolation Panel    Frequency of Communication with Friends and Family: More than three times a week    Frequency of Social Gatherings with Friends and Family: More than three times a week    Attends Religious Services: More than 4 times per year    Active Member of [employer] or Organizations: Yes    Attends Banker Meetings: More than 4 times per year    Marital Status: Widowed   Past Surgical History:  Procedure Laterality Date   RIGHT HEART CATH N/A 07/10/2018   Procedure: RIGHT HEART CATH;  Surgeon: [name], MD;  Location: [facility] INVASIVE CV  LAB;  Service: Cardiovascular;  Laterality: N/A;   RIGHT/LEFT HEART CATH AND CORONARY ANGIOGRAPHY N/A 06/18/2018   Procedure: RIGHT/LEFT HEART CATH AND CORONARY ANGIOGRAPHY;  Surgeon: Burnard Debby LABOR, MD;  Location: [facility] INVASIVE CV LAB;  Service: Cardiovascular;  Laterality: N/A;   Past Medical History:  Diagnosis Date   Chronic combined systolic and diastolic CHF (congestive heart failure) (HCC)    CKD (chronic kidney disease), stage III (HCC)    Hyperlipidemia    Hypertension    Lower extremity edema    Mild CAD    a. mild-mod by cath 05/2018.   Multiple myeloma (HCC) 11/15/2018   NICM (nonischemic cardiomyopathy) (HCC)    Peripheral neuropathy    Prostate cancer (HCC)    Status post XRT   Rheumatic fever    Stroke [facility])    Trifascicular block    Type 2 diabetes mellitus (HCC)  Vertigo    BP 133/67   Pulse 72   Ht 5' 7 (1.702 m)   Wt 192 lb (87.1 kg)   SpO2 91%   BMI 30.07 kg/m   Opioid Risk Score:   Fall Risk Score:  `1  Depression screen [facility] 2/9     11/05/2023    3:39 PM 08/16/2023    2:17 PM 07/26/2023   10:32 AM 10/24/2022    9:32 AM 07/26/2022    9:19 AM 02/10/2022    2:24 PM 11/17/2021   11:15 AM  Depression screen PHQ 2/9  Decreased Interest 0 0 0 0 0 0 0  Down, Depressed, Hopeless 0 0 0 0 0 0 0  PHQ - 2 Score 0 0 0 0 0 0 0  Altered sleeping 2        Tired, decreased energy 1        Change in appetite 0        Feeling bad or failure about yourself  0        Trouble concentrating 1        Moving slowly or fidgety/restless 3        Suicidal thoughts 0        PHQ-9 Score 7        Difficult doing work/chores Extremely dIfficult           Review of Systems  Gastrointestinal:  Positive for constipation.  Musculoskeletal:  Positive for gait problem.       B/l hand pain  Neurological:  Positive for weakness and numbness.  All other systems reviewed and are negative.      Objective:   Gen: no distress, normal appearing HEENT: oral mucosa pink and moist,  NCAT Cardio: Reg rate Chest: normal effort, normal rate of breathing Abd: soft, non-distended Ext: no edema Psych: pleasant, normal affect Skin: intact Neuro: Alert and oriented x3       Assessment & Plan:  Ischemic Cerebrovascular Accident: Continue Home Health Therapy with [facility]. Has a scheduled appointment with Neurology. Continue current medication regimen. Continue to Monitor.  Encouraged eating a lot of fruits and vegetables Discussed that he cannot stand himself Discussed that therapy is going well Discussed that he can stand for 10-15 minutes but cannot take steps Discussed potential aquatherapy in the future Discussed that he is very motivated to improve in his recovery 2.  Essential Hypertension: Continue current medication regimen. PCP Following. Continue to Monitor.  -Advised checking BP daily at home and logging results to bring into follow-up appointment with PCP and myself. -Reviewed BP meds today.  -Advised regarding healthy foods that can help lower blood pressure and provided with a list: 1) citrus foods- high in vitamins and minerals 2) salmon and other fatty fish - reduces inflammation and oxylipins 3) swiss chard (leafy green)- high level of nitrates 4) pumpkin seeds- one of the best natural sources of magnesium 5) Beans and lentils- high in fiber, magnesium, and potassium 6) Berries- high in flavonoids 7) Amaranth (whole grain, can be cooked similarly to rice and oats)- high in magnesium and fiber 8) Pistachios- even more effective at reducing BP than other nuts 9) Carrots- high in phenolic compounds that relax blood vessels and reduce inflammation 10) Celery- contain phthalides that relax tissues of arterial walls 11) Tomatoes- can also improve cholesterol and reduce risk of heart disease 12) Broccoli- good source of magnesium, calcium , and potassium 13) Greek yogurt: high in potassium and calcium  14) Herbs and  spices: Celery seed,  cilantro, saffron, lemongrass, black cumin, ginseng, cinnamon, cardamom, sweet basil, and ginger 15) Chia and flax seeds- also help to lower cholesterol and blood sugar 16) Beets- high levels of nitrates that relax blood vessels  17) spinach and bananas- high in potassium  -Provided lise of supplements that can help with hypertension:  1) magnesium: one high quality brand is Bioptemizers since it contains all 7 types of magnesium, otherwise over the counter magnesium gluconate 400mg  is a good option 2) B vitamins 3) vitamin D  4) potassium 5) CoQ10 6) L-arginine 7) Vitamin C 8) Beetroot -Educated that goal BP is 120/80. -Made goal to incorporate some of the above foods into diet.    F/U with Dr Lorilee in 4-6 weeks .

## 2023-12-10 DIAGNOSIS — I69351 Hemiplegia and hemiparesis following cerebral infarction affecting right dominant side: Secondary | ICD-10-CM | POA: Diagnosis not present

## 2023-12-10 DIAGNOSIS — N184 Chronic kidney disease, stage 4 (severe): Secondary | ICD-10-CM | POA: Diagnosis not present

## 2023-12-10 DIAGNOSIS — I69392 Facial weakness following cerebral infarction: Secondary | ICD-10-CM | POA: Diagnosis not present

## 2023-12-10 DIAGNOSIS — I130 Hypertensive heart and chronic kidney disease with heart failure and stage 1 through stage 4 chronic kidney disease, or unspecified chronic kidney disease: Secondary | ICD-10-CM | POA: Diagnosis not present

## 2023-12-10 DIAGNOSIS — E1122 Type 2 diabetes mellitus with diabetic chronic kidney disease: Secondary | ICD-10-CM | POA: Diagnosis not present

## 2023-12-10 DIAGNOSIS — I5042 Chronic combined systolic (congestive) and diastolic (congestive) heart failure: Secondary | ICD-10-CM | POA: Diagnosis not present

## 2023-12-11 ENCOUNTER — Ambulatory Visit (HOSPITAL_COMMUNITY)
Admission: RE | Admit: 2023-12-11 | Discharge: 2023-12-11 | Disposition: A | Discharge status: Home / Self Care | Source: Ambulatory Visit | Attending: Cardiology | Admitting: Cardiology

## 2023-12-11 ENCOUNTER — Ambulatory Visit (HOSPITAL_COMMUNITY): Payer: Self-pay | Admitting: Pharmacist

## 2023-12-11 VITALS — BP 120/72 | HR 66 | Wt 198.2 lb

## 2023-12-11 DIAGNOSIS — I5042 Chronic combined systolic (congestive) and diastolic (congestive) heart failure: Secondary | ICD-10-CM | POA: Diagnosis not present

## 2023-12-11 DIAGNOSIS — Z79899 Other long term (current) drug therapy: Secondary | ICD-10-CM | POA: Insufficient documentation

## 2023-12-11 DIAGNOSIS — D472 Monoclonal gammopathy: Secondary | ICD-10-CM | POA: Insufficient documentation

## 2023-12-11 DIAGNOSIS — I428 Other cardiomyopathies: Secondary | ICD-10-CM | POA: Insufficient documentation

## 2023-12-11 DIAGNOSIS — Z8673 Personal history of transient ischemic attack (TIA), and cerebral infarction without residual deficits: Secondary | ICD-10-CM | POA: Diagnosis not present

## 2023-12-11 DIAGNOSIS — N184 Chronic kidney disease, stage 4 (severe): Secondary | ICD-10-CM | POA: Insufficient documentation

## 2023-12-11 DIAGNOSIS — I130 Hypertensive heart and chronic kidney disease with heart failure and stage 1 through stage 4 chronic kidney disease, or unspecified chronic kidney disease: Secondary | ICD-10-CM | POA: Diagnosis not present

## 2023-12-11 DIAGNOSIS — Z7982 Long term (current) use of aspirin: Secondary | ICD-10-CM | POA: Insufficient documentation

## 2023-12-11 DIAGNOSIS — E1122 Type 2 diabetes mellitus with diabetic chronic kidney disease: Secondary | ICD-10-CM | POA: Diagnosis not present

## 2023-12-11 DIAGNOSIS — I69351 Hemiplegia and hemiparesis following cerebral infarction affecting right dominant side: Secondary | ICD-10-CM | POA: Diagnosis not present

## 2023-12-11 DIAGNOSIS — I272 Pulmonary hypertension, unspecified: Secondary | ICD-10-CM | POA: Diagnosis not present

## 2023-12-11 DIAGNOSIS — I2510 Atherosclerotic heart disease of native coronary artery without angina pectoris: Secondary | ICD-10-CM | POA: Insufficient documentation

## 2023-12-11 DIAGNOSIS — I5022 Chronic systolic (congestive) heart failure: Secondary | ICD-10-CM | POA: Insufficient documentation

## 2023-12-11 DIAGNOSIS — I69392 Facial weakness following cerebral infarction: Secondary | ICD-10-CM | POA: Diagnosis not present

## 2023-12-11 LAB — BASIC METABOLIC PANEL WITH GFR
Anion gap: 6 (ref 5–15)
BUN: 14 mg/dL (ref 8–23)
CO2: 31 mmol/L (ref 22–32)
Calcium: 9.6 mg/dL (ref 8.9–10.3)
Chloride: 107 mmol/L (ref 98–111)
Creatinine, Ser: 2.16 mg/dL — ABNORMAL HIGH (ref 0.61–1.24)
GFR, Estimated: 29 mL/min — ABNORMAL LOW (ref >60)
Glucose, Bld: 76 mg/dL (ref 70–99)
Potassium: 4 mmol/L (ref 3.5–5.1)
Sodium: 144 mmol/L (ref 135–145)

## 2023-12-11 LAB — BRAIN NATRIURETIC PEPTIDE: B Natriuretic Peptide: 859.6 pg/mL — ABNORMAL HIGH (ref 0.0–100.0)

## 2023-12-11 MED ORDER — TORSEMIDE 20 MG PO TABS: 40.0000 mg | ORAL_TABLET | Freq: Every day | ORAL | 3 refills | Status: DC

## 2023-12-11 NOTE — Patient Instructions (Signed)
 It was a pleasure seeing you today!  MEDICATIONS: -No medication changes today. We will wait until after labs return to make any changes if necessary. -Call if you have questions about your medications.  LABS: -We will call you if your labs need attention.  NEXT APPOINTMENT: Return to clinic in 2 months with Dr. Rolan.  In general, to take care of your heart failure: -Limit your fluid intake to 2 Liters (half-gallon) per day.   -Limit your salt intake to ideally 2-3 grams (2000-3000 mg) per day. -Weigh yourself daily and record, and bring that weight diary to your next appointment.  (Weight gain of 2-3 pounds in 1 day typically means fluid weight.) -The medications for your heart are to help your heart and help you live longer.   -Please contact us  before stopping any of your heart medications.  Call the clinic at [phone] with questions or to reschedule future appointments.

## 2023-12-11 NOTE — Progress Notes (Signed)
 ReDS Vest / Clip - 12/11/23 1100       ReDS Vest / Clip   Station Marker B    Ruler Value 35    ReDS Value Range Low volume    ReDS Actual Value 31

## 2023-12-12 ENCOUNTER — Telehealth (HOSPITAL_COMMUNITY): Payer: Self-pay

## 2023-12-12 NOTE — Telephone Encounter (Signed)
  ADVANCED HEART FAILURE CLINIC   Pre-operative Risk Assessment    Request for Surgical Clearance{  Procedure:  Dental Extraction - Amount of Teeth to be Pulled:  1 (extraction of tooth #32)  Date of Surgery:  Clearance TBD                                Surgeon:  not listed  Surgeon's Group or Practice Name:  [facility]'s Phone number:  [phone] Fax number:  [phone] { Type of Clearance Requested:   - Medical  - Pharmacy:  Hold Aspirin  and Clopidogrel  (Plavix ) ---how long does this need to be held?  { Type of Anesthesia:  Local    Additional requests/questions:  Please fax a copy of Clearance with medication recommendations to the surgeon's office.  Signed, Maanav Kassabian B Ty Buntrock   12/12/2023, 11:00 AM

## 2023-12-12 NOTE — Telephone Encounter (Signed)
 Allen [facility]yer with [facility] aware Clearance details printed and faxed to number provided

## 2023-12-13 ENCOUNTER — Encounter: Payer: Self-pay | Admitting: Cardiology

## 2023-12-13 ENCOUNTER — Ambulatory Visit: Attending: Cardiology | Admitting: Cardiology

## 2023-12-13 VITALS — BP 124/68 | HR 74 | Ht 67.5 in | Wt 197.8 lb

## 2023-12-13 DIAGNOSIS — G4733 Obstructive sleep apnea (adult) (pediatric): Secondary | ICD-10-CM | POA: Diagnosis not present

## 2023-12-13 DIAGNOSIS — I10 Essential (primary) hypertension: Secondary | ICD-10-CM | POA: Insufficient documentation

## 2023-12-13 NOTE — Progress Notes (Signed)
 Evaluation Performed:  Follow-up visit  Date:  12/13/2023   ID:  Allen Dyer, DOB 03-20-1936, MRN [id number]  PCP:  Regino Slater, MD  Cardiologist:  Ezra Shuck, MD Sleep Medicine: Wilbert Bihari, MD Electrophysiologist:  None   Chief Complaint:  OSA and HTN  History of Present Illness:    Allen Dyer is a 88 y.o. male with a history of type 2 diabetes mellitus, rheumatic fever, hypertension, peripheral neuropathy with edema, hyperlipidemia, chronic CKD stage III, prostate CA status post XRT.   2D echo 05/29/2018 showed moderate to severe LV dysfunction with EF 30 to 35% with moderately dilated LV and grade 2 diastolic dysfunction, moderate pulmonary hypertension with PASP 59 mmHg.   He underwent R/L heart cath showing mildly elevated right and left heart filling pressures with moderate pulmonary venous HTN with normal PVR 1.7 and preserved CO. He had nonobstructive CAD with 50% pLAD.  He was changed form Lasix  to Torsemide  40mg  BID.   Repeat 2D echo on guideline directed HF therapy revealed persistent LV dysfunction with EF 25-30% and grade 2 DD and mild to moderate AR.  He is followed in AHF clinic.     He was dx with severe OSA with an AHI of 59/hr and nocturnal hypoxemia with O2 desats as low as 63% and was titrated and now on BiPAP at 19/15cm H2O.  He ultimately was changed to auto BiPAP with IPAP max 20cm H2O, EPAP min 5cm H2O and PS 4cm H2O.    Unfortunately he has not been wearing his CPAP since June.  He had a CVA May 2025 and was hospitalized until 6/5 and went home and then readmitted shortly after that with another CVA.  He has not worn his PAP since his last hospitalization.  He now has an abcessed tooth and needs dental work done.  He says that his face has been swollen.  He has had problems since his CVA with his mask fitting.  He was switched to a nasal pillow mask but the pressure was too high to prevent leaking.  He then went to a FFM with the tube on top but it kept  leaking.   Prior CV studies:   The following studies were reviewed today:  PAP compliance download  Past Medical History:  Diagnosis Date   Chronic combined systolic and diastolic CHF (congestive heart failure) (HCC)    CKD (chronic kidney disease), stage III (HCC)    Hyperlipidemia    Hypertension    Lower extremity edema    Mild CAD    a. mild-mod by cath 05/2018.   Multiple myeloma (HCC) 11/15/2018   NICM (nonischemic cardiomyopathy) (HCC)    Peripheral neuropathy    Prostate cancer (HCC)    Status post XRT   Rheumatic fever    Stroke [facility])    Trifascicular block    Type 2 diabetes mellitus (HCC)    Vertigo    Past Surgical History:  Procedure Laterality Date   RIGHT HEART CATH N/A 07/10/2018   Procedure: RIGHT HEART CATH;  Surgeon: Shuck Ezra RAMAN, MD;  Location: [facility] INVASIVE CV LAB;  Service: Cardiovascular;  Laterality: N/A;   RIGHT/LEFT HEART CATH AND CORONARY ANGIOGRAPHY N/A 06/18/2018   Procedure: RIGHT/LEFT HEART CATH AND CORONARY ANGIOGRAPHY;  Surgeon: Burnard Debby LABOR, MD;  Location: [facility] INVASIVE CV LAB;  Service: Cardiovascular;  Laterality: N/A;     Current Meds  Medication Sig   acetaminophen  (TYLENOL ) 325 MG tablet Take 2 tablets (650 mg total) by mouth every  4 (four) hours as needed for mild pain (pain score 1-3) (or temp > 37.5 C (99.5 F)).   Ascorbic Acid (VITAMIN C) 1000 MG tablet Take 4,000 mg by mouth every morning.   aspirin  81 MG chewable tablet Chew 81 mg by mouth daily.   Blood Glucose Monitoring Suppl (FREESTYLE LITE) w/Device KIT 1 Device by Does not apply route daily in the afternoon.   carvedilol  (COREG ) 6.25 MG tablet Take 1 tablet (6.25 mg total) by mouth 2 (two) times daily with a meal.   clopidogrel  (PLAVIX ) 75 MG tablet Take 1 tablet (75 mg total) by mouth daily.   cyanocobalamin  1000 MCG tablet Take 1 tablet (1,000 mcg total) by mouth every morning.   ezetimibe  (ZETIA ) 10 MG tablet Take 1 tablet (10 mg total) by mouth daily.   Garlic 1000 MG  CAPS Take 1,000 mg by mouth every morning.   glipiZIDE  (GLUCOTROL ) 5 MG tablet Take by mouth 2 (two) times daily before a meal.   glucose blood (FREESTYLE LITE) test strip 1 each by Other route daily in the afternoon. Use as instructed   isosorbide  mononitrate (IMDUR ) 30 MG 24 hr tablet Take 0.5 tablets (15 mg total) by mouth every morning.   Omega-3 Fatty Acids (FISH OIL) 1200 MG CAPS Take 1,200 mg by mouth daily.   potassium chloride  SA (KLOR-CON  M) 20 MEQ tablet Take 2 tablets (40 mEq total) by mouth daily.   scopolamine  (TRANSDERM-SCOP) 1 MG/3DAYS Place 1 patch (1.5 mg total) onto the skin every 3 (three) days.   tamsulosin  (FLOMAX ) 0.4 MG CAPS capsule Take 1 capsule (0.4 mg total) by mouth 2 (two) times daily.   torsemide  (DEMADEX ) 20 MG tablet Take 2 tablets (40 mg total) by mouth daily.     Allergies:   Tramadol, Finasteride, Jardiance  [empagliflozin ], Lisinopril, Ciprofloxacin, Simvastatin, and Sulfa antibiotics   Social History   Tobacco Use   Smoking status: Former    Current packs/day: 0.00    Average packs/day: 2.0 packs/day for 10.0 years (20.0 ttl pk-yrs)    Types: Cigarettes    Start date: 1963    Quit date: 38    Years since quitting: 52.6   Smokeless tobacco: Never  Vaping Use   Vaping status: Never Used  Substance Use Topics   Alcohol use: Not Currently    Comment: quit in 1975, no h/o heavy use   Drug use: Never     Family Hx: The patient's family history includes Cancer in his mother and sister; Congestive Heart Failure in his brother and father. There is no history of Stroke.  ROS:   Please see the history of present illness.     All other systems reviewed and are negative.   Labs/Other Tests and Data Reviewed:    Recent Labs: 09/09/2023: TSH 2.615 10/06/2023: ALT 19 10/07/2023: Hemoglobin 11.5; Platelets 135 12/11/2023: B Natriuretic Peptide 859.6; BUN 14; Creatinine, Ser 2.16; Potassium 4.0; Sodium 144   Recent Lipid Panel Lab Results  Component  Value Date/Time   CHOL 126 10/07/2023 06:59 AM   TRIG 75 10/07/2023 06:59 AM   HDL 38 (L) 10/07/2023 06:59 AM   CHOLHDL 3.3 10/07/2023 06:59 AM   LDLCALC 73 10/07/2023 06:59 AM    Wt Readings from Last 3 Encounters:  12/13/23 197 lb 12.8 oz (89.7 kg)  12/11/23 198 lb 3.2 oz (89.9 kg)  12/07/23 192 lb (87.1 kg)     Objective:    Vital Signs:  BP 124/68   Pulse 74  Ht 5' 7.5 (1.715 m)   Wt 197 lb 12.8 oz (89.7 kg)   SpO2 99%   BMI 30.52 kg/m   GEN: Well nourished, well developed in no acute distress HEENT: Normal NECK: No JVD; No carotid bruits LYMPHATICS: No lymphadenopathy CARDIAC:RRR, no murmurs, rubs, gallops RESPIRATORY:  Clear to auscultation without rales, wheezing or rhonchi  ABDOMEN: Soft, non-tender, non-distended MUSCULOSKELETAL:  No edema; No deformity  SKIN: Warm and dry NEUROLOGIC:  Alert and oriented x 3 PSYCHIATRIC:  Normal affect  ASSESSMENT & PLAN:    OSA - He had 2 CVAs since May 2025 and his face shape has changed so he has really struggled with finding a mask that fits.  He has tried several masks but did not have a formal mask fitting. He stopped using his device in June after his second CVA and now has a swollen face from an abcessed tooth.    The PAP download performed by his DME was personally reviewed and interpreted by me today and showed an AHI of 23.4 /hr on auto BiPAP with IPAP max 20 cm H2O, EPAP min 5 cm H2O and pressure support 4 cm H2O cm H2O with 3  % compliance in using more than 4 hours nightly.    It looks like he has a very high mask leak on his device.  I am going to send him back to the DME to get a mask fitting and then we will repeat a download.  If AHI is still elevated he will need to go back to the sleep lab for further titration  HTN -BP controlled on exam today -Continue carvedilol  6.25 mg twice daily, Imdur  15 mg daily  Medication Adjustments/Labs and Tests Ordered: Current medicines are reviewed at length with the  patient today.  Concerns regarding medicines are outlined above.  Tests Ordered: No orders of the defined types were placed in this encounter.   Medication Changes: No orders of the defined types were placed in this encounter.    Disposition:  Follow up in 2 months  Signed, Wilbert Bihari, MD  12/13/2023 10:08 AM    [facility]

## 2023-12-13 NOTE — Patient Instructions (Addendum)
 Medication Instructions:  No Changes  *If you need a refill on your cardiac medications before your next appointment, please call your pharmacy*  Lab Work: None  Follow-Up: At [facility], you and your health needs are our priority.  As part of our continuing mission to provide you with exceptional heart care, our providers are all part of one team.  This team includes your primary Cardiologist (physician) and Advanced Practice Providers or APPs (Physician Assistants and Nurse Practitioners) who all work together to provide you with the care you need, when you need it.  Your next appointment:   2 month(s)  Provider:    Dr. Wilbert Bihari    (Sleep follow up)

## 2023-12-14 DIAGNOSIS — N35013 Post-traumatic anterior urethral stricture: Secondary | ICD-10-CM | POA: Diagnosis not present

## 2023-12-14 DIAGNOSIS — R3914 Feeling of incomplete bladder emptying: Secondary | ICD-10-CM | POA: Diagnosis not present

## 2023-12-17 ENCOUNTER — Other Ambulatory Visit: Payer: Self-pay

## 2023-12-17 ENCOUNTER — Emergency Department (HOSPITAL_COMMUNITY)
Admission: EM | Admit: 2023-12-17 | Discharge: 2023-12-17 | Disposition: A | Discharge status: Home / Self Care | Attending: Emergency Medicine | Admitting: Emergency Medicine

## 2023-12-17 ENCOUNTER — Emergency Department (HOSPITAL_COMMUNITY)

## 2023-12-17 ENCOUNTER — Telehealth: Payer: Self-pay | Admitting: *Deleted

## 2023-12-17 ENCOUNTER — Encounter (HOSPITAL_COMMUNITY): Payer: Self-pay

## 2023-12-17 DIAGNOSIS — Z87891 Personal history of nicotine dependence: Secondary | ICD-10-CM | POA: Diagnosis not present

## 2023-12-17 DIAGNOSIS — N185 Chronic kidney disease, stage 5: Secondary | ICD-10-CM | POA: Diagnosis not present

## 2023-12-17 DIAGNOSIS — N184 Chronic kidney disease, stage 4 (severe): Secondary | ICD-10-CM | POA: Diagnosis not present

## 2023-12-17 DIAGNOSIS — W19XXXA Unspecified fall, initial encounter: Secondary | ICD-10-CM

## 2023-12-17 DIAGNOSIS — Z7984 Long term (current) use of oral hypoglycemic drugs: Secondary | ICD-10-CM | POA: Insufficient documentation

## 2023-12-17 DIAGNOSIS — Z7982 Long term (current) use of aspirin: Secondary | ICD-10-CM | POA: Diagnosis not present

## 2023-12-17 DIAGNOSIS — W01198A Fall on same level from slipping, tripping and stumbling with subsequent striking against other object, initial encounter: Secondary | ICD-10-CM | POA: Insufficient documentation

## 2023-12-17 DIAGNOSIS — S0990XA Unspecified injury of head, initial encounter: Secondary | ICD-10-CM | POA: Insufficient documentation

## 2023-12-17 DIAGNOSIS — Z7902 Long term (current) use of antithrombotics/antiplatelets: Secondary | ICD-10-CM | POA: Diagnosis not present

## 2023-12-17 DIAGNOSIS — E1122 Type 2 diabetes mellitus with diabetic chronic kidney disease: Secondary | ICD-10-CM | POA: Diagnosis not present

## 2023-12-17 DIAGNOSIS — Z8546 Personal history of malignant neoplasm of prostate: Secondary | ICD-10-CM | POA: Diagnosis not present

## 2023-12-17 DIAGNOSIS — G4733 Obstructive sleep apnea (adult) (pediatric): Secondary | ICD-10-CM

## 2023-12-17 DIAGNOSIS — Z8673 Personal history of transient ischemic attack (TIA), and cerebral infarction without residual deficits: Secondary | ICD-10-CM | POA: Diagnosis not present

## 2023-12-17 DIAGNOSIS — I5042 Chronic combined systolic (congestive) and diastolic (congestive) heart failure: Secondary | ICD-10-CM | POA: Diagnosis not present

## 2023-12-17 DIAGNOSIS — I10 Essential (primary) hypertension: Secondary | ICD-10-CM

## 2023-12-17 DIAGNOSIS — I509 Heart failure, unspecified: Secondary | ICD-10-CM | POA: Insufficient documentation

## 2023-12-17 DIAGNOSIS — I130 Hypertensive heart and chronic kidney disease with heart failure and stage 1 through stage 4 chronic kidney disease, or unspecified chronic kidney disease: Secondary | ICD-10-CM | POA: Diagnosis not present

## 2023-12-17 DIAGNOSIS — I69351 Hemiplegia and hemiparesis following cerebral infarction affecting right dominant side: Secondary | ICD-10-CM | POA: Diagnosis not present

## 2023-12-17 DIAGNOSIS — I5022 Chronic systolic (congestive) heart failure: Secondary | ICD-10-CM

## 2023-12-17 DIAGNOSIS — I69392 Facial weakness following cerebral infarction: Secondary | ICD-10-CM | POA: Diagnosis not present

## 2023-12-17 NOTE — Telephone Encounter (Signed)
 Order placed to [facility] for a mask fitting and then get a download in 4 weeks

## 2023-12-17 NOTE — ED Triage Notes (Signed)
 Pt was sitting in his wheelchair and family went to put his feet up and accidentally dumped pt out of chair and pt fell backwards hitting his head. Pt is on plavix , did not have any LOC. No neck or back pain.

## 2023-12-17 NOTE — Telephone Encounter (Signed)
-----   Message from Allen Dyer sent at 12/13/2023 10:14 AM EDT ----- Please send him to DME for a mask fitting and then get a download in 4 weeks

## 2023-12-17 NOTE — ED Provider Notes (Signed)
 [facility] AT [facility] Provider Note   CSN: [id number] Arrival date & time: 12/17/23  1412     Patient presents with: [place]   Allen Dyer is a 88 y.o. male with past medical history of T2DM, prostate cancer, CKD stage IV, HLD, HF, anemia, CVA (09/02/23) presents to emergency department for evaluation of head injury today.  Reports that wife was elevating his feet when he excellently fell backwards and hit his head.  No LOC.  Is currently on Plavix  and aspirin  for previous CVA in May.  Has mild to moderate dysarthria from previous stroke as noted by neurology note.  Currently has no complaints.  Did seek ED evaluation as he is on Plavix  and had head injury per wife recommendation to r/o ICH.  No complaints prior to head injury    Fall       Prior to Admission medications   Medication Sig Start Date End Date Taking? Authorizing Provider  acetaminophen  (TYLENOL ) 325 MG tablet Take 2 tablets (650 mg total) by mouth every 4 (four) hours as needed for mild pain (pain score 1-3) (or temp > 37.5 C (99.5 F)). 09/05/23   Angiulli, Toribio PARAS, PA-C  Ascorbic Acid (VITAMIN C) 1000 MG tablet Take 4,000 mg by mouth every morning.    [provider]  aspirin  81 MG chewable tablet Chew 81 mg by mouth daily. 11/27/23   [provider]  Blood Glucose Monitoring Suppl (FREESTYLE LITE) w/Device KIT 1 Device by Does not apply route daily in the afternoon. 05/24/21   Shamleffer, Ibtehal Jaralla, MD  carvedilol  (COREG ) 6.25 MG tablet Take 1 tablet (6.25 mg total) by mouth 2 (two) times daily with a meal. 11/29/23   [name], MD  clopidogrel  (PLAVIX ) 75 MG tablet Take 1 tablet (75 mg total) by mouth daily. 09/26/23   Angiulli, Toribio PARAS, PA-C  cyanocobalamin  1000 MCG tablet Take 1 tablet (1,000 mcg total) by mouth every morning. 09/26/23   Angiulli, Toribio PARAS, PA-C  ezetimibe  (ZETIA ) 10 MG tablet Take 1 tablet (10 mg total) by mouth daily. 11/13/23 02/11/24   Glena Harlene HERO, FNP  Garlic 1000 MG CAPS Take 1,000 mg by mouth every morning.    [provider]  glipiZIDE  (GLUCOTROL ) 5 MG tablet Take by mouth 2 (two) times daily before a meal.    [provider]  glucose blood (FREESTYLE LITE) test strip 1 each by Other route daily in the afternoon. Use as instructed 05/24/21   Shamleffer, Ibtehal Jaralla, MD  isosorbide  mononitrate (IMDUR ) 30 MG 24 hr tablet Take 0.5 tablets (15 mg total) by mouth every morning. 10/09/23   Perri DELENA Meliton Mickey., MD  Omega-3 Fatty Acids (FISH OIL) 1200 MG CAPS Take 1,200 mg by mouth daily.    [provider]  potassium chloride  SA (KLOR-CON  M) 20 MEQ tablet Take 2 tablets (40 mEq total) by mouth daily. 11/13/23   [place], Harlene HERO, FNP  scopolamine  (TRANSDERM-SCOP) 1 MG/3DAYS Place 1 patch (1.5 mg total) onto the skin every 3 (three) days. 12/07/23   Raulkar, Sven SQUIBB, MD  tamsulosin  (FLOMAX ) 0.4 MG CAPS capsule Take 1 capsule (0.4 mg total) by mouth 2 (two) times daily. 09/26/23   Angiulli, Toribio PARAS, PA-C  torsemide  (DEMADEX ) 20 MG tablet Take 2 tablets (40 mg total) by mouth daily. 12/11/23   [name], MD    Allergies: Tramadol, Finasteride, Jardiance  [empagliflozin ], Lisinopril, Ciprofloxacin, Simvastatin, and Sulfa antibiotics    Review of Systems  Musculoskeletal:  Negative for neck pain.    Updated Vital Signs BP (!) 167/94 (BP Location: Right Arm)   Pulse 74   Temp 98.5 F (36.9 C) (Oral)   Resp 16   SpO2 95%   Physical Exam Vitals and nursing note reviewed.  Constitutional:      General: He is not in acute distress.    Appearance: Normal appearance. He is not ill-appearing or diaphoretic.  HENT:     Head: Normocephalic and atraumatic.     Comments: No hematoma nor TTP of cranium No crepitus to facial bones    Right Ear: External ear normal. No hemotympanum.     Left Ear: External ear normal. No hemotympanum.     Nose: Nose normal.     Right Nostril: No epistaxis  or septal hematoma.     Left Nostril: No epistaxis or septal hematoma.     Mouth/Throat:     Mouth: Mucous membranes are moist. No injury or lacerations.  Eyes:     General: Lids are normal. Vision grossly intact. No visual field deficit.       Right eye: No discharge.        Left eye: No discharge.     Extraocular Movements: Extraocular movements intact.     Right eye: Normal extraocular motion and no nystagmus.     Left eye: Normal extraocular motion and no nystagmus.     Conjunctiva/sclera: Conjunctivae normal.     Pupils: Pupils are equal, round, and reactive to light.     Comments: No subconjunctival hemorrhage, hyphema, tear drop pupil, or fluid leakage bilaterally.  No signs of EOM entrapment  Neck:     Vascular: No carotid bruit.  Cardiovascular:     Rate and Rhythm: Normal rate.     Pulses: Normal pulses.          Radial pulses are 2+ on the right side and 2+ on the left side.       Dorsalis pedis pulses are 2+ on the right side and 2+ on the left side.     Heart sounds: Normal heart sounds.  Pulmonary:     Effort: Pulmonary effort is normal. No respiratory distress.     Breath sounds: Normal breath sounds. No wheezing.  Chest:     Chest wall: No tenderness.  Abdominal:     General: Bowel sounds are normal. There is no distension.     Palpations: Abdomen is soft.     Tenderness: There is no abdominal tenderness. There is no guarding or rebound.  Musculoskeletal:     Cervical back: Full passive range of motion without pain, normal range of motion and neck supple. No deformity, rigidity or bony tenderness. Normal range of motion.     Thoracic back: No deformity or bony tenderness. Normal range of motion.     Lumbar back: No deformity or bony tenderness. Normal range of motion.     Right hip: No bony tenderness or crepitus.     Left hip: No bony tenderness or crepitus.     Right lower leg: No edema.     Left lower leg: No edema.     Comments: No obvious deformity to  joints or long bones Pelvis stable with no shortening or rotation of LE bilaterally  Skin:    General: Skin is warm and dry.     Capillary Refill: Capillary refill takes less than 2 seconds.     Coloration: Skin is not jaundiced or pale.  Neurological:  General: No focal deficit present.     Mental Status: He is alert and oriented to person, place, and time. Mental status is at baseline.     GCS: GCS eye subscore is 4. GCS verbal subscore is 5. GCS motor subscore is 6.     Cranial Nerves: Cranial nerves 2-12 are intact. No cranial nerve deficit, dysarthria or facial asymmetry.     Sensory: Sensation is intact. No sensory deficit.     Motor: Motor function is intact. No weakness, tremor, abnormal muscle tone, seizure activity or pronator drift.     Coordination: Coordination is intact. Coordination normal. Finger-Nose-Finger Test and Heel to [facility] Test normal.     Gait: Gait is intact. Gait normal.     Deep Tendon Reflexes: Reflexes are normal and symmetric. Reflexes normal.     Comments: following commands appropriately. Nonambulatory since previous stroke and uses wheelchair to get around. Mild dysarthria per baseline from stroke.  Motor 5/5 and sensation 2/2 of BUE and BLE.     (all labs ordered are listed, but only abnormal results are displayed) Labs Reviewed - No data to display  EKG: None  Radiology: CT Head Wo Contrast Result Date: 12/17/2023 EXAM: CT HEAD AND CERVICAL SPINE 12/17/2023 04:30:40 PM TECHNIQUE: CT of the head and cervical spine was performed without the administration of intravenous contrast. Multiplanar reformatted images are provided for review. Automated exposure control, iterative reconstruction, and/or weight based adjustment of the mA/kV was utilized to reduce the radiation dose to as low as reasonably achievable. COMPARISON: Head CT and MRI 10/06/2023. Cervical spine CT 09/02/2023. CLINICAL HISTORY: Facial trauma, blunt. Pt was sitting in his wheelchair and  family went to put his feet up and accidentally dumped pt out of chair and pt fell backwards hitting his head. Pt is on plavix , did not have any LOC. No neck or back pain. FINDINGS: CT HEAD BRAIN AND VENTRICLES: No acute intracranial hemorrhage. No mass effect or midline shift. No abnormal extra-axial fluid collection. Gray-white differentiation is maintained. No hydrocephalus. Multiple chronic infarcts involving the cerebellum and middle cerebellar peduncles bilaterally which have evolved from the prior studies. Unchanged chronical lacunar infarct at the posterior aspect of the right basal ganglia. Mild cerebral atrophy, within normal limits for age. ORBITS: Bilateral cataract extraction. SINUSES AND MASTOIDS: No acute abnormality. SOFT TISSUES AND SKULL: No acute skull fracture. No acute soft tissue abnormality. Calcified atherosclerosis at the skull base. CT CERVICAL SPINE BONES AND ALIGNMENT: No acute fracture or suspicious lesion. Straightening of the normal cervical lordosis. Trace anterolisthesis of C5 on C6. DEGENERATIVE CHANGES: Moderate median C1-2 arthropathy. Similar appearance of diffuse cervical spondylosis and advanced disc space narrowing at C6-7 where disc bulging and uncovertebral spurring result in at least mild spinal stenosis and moderate right and severe left neural foraminal stenosis. Moderate-to-severe facet arthrosis bilaterally at C3-4 with moderate bilateral neural foraminal stenosis. Partial bilateral facet ankylosis at C2-3. SOFT TISSUES: No prevertebral soft tissue swelling. IMPRESSION: 1. No evidence of acute intracranial or cervical spine injury. 2. Chronic cerebellar infarcts. Electronically signed by: Dasie Hamburg MD 12/17/2023 04:54 PM EDT RP Workstation: [id number]   CT Cervical Spine Wo Contrast Result Date: 12/17/2023 EXAM: CT HEAD AND CERVICAL SPINE 12/17/2023 04:30:40 PM TECHNIQUE: CT of the head and cervical spine was performed without the administration of intravenous  contrast. Multiplanar reformatted images are provided for review. Automated exposure control, iterative reconstruction, and/or weight based adjustment of the mA/kV was utilized to reduce the radiation dose to as low as  reasonably achievable. COMPARISON: Head CT and MRI 10/06/2023. Cervical spine CT 09/02/2023. CLINICAL HISTORY: Facial trauma, blunt. Pt was sitting in his wheelchair and family went to put his feet up and accidentally dumped pt out of chair and pt fell backwards hitting his head. Pt is on plavix , did not have any LOC. No neck or back pain. FINDINGS: CT HEAD BRAIN AND VENTRICLES: No acute intracranial hemorrhage. No mass effect or midline shift. No abnormal extra-axial fluid collection. Gray-white differentiation is maintained. No hydrocephalus. Multiple chronic infarcts involving the cerebellum and middle cerebellar peduncles bilaterally which have evolved from the prior studies. Unchanged chronical lacunar infarct at the posterior aspect of the right basal ganglia. Mild cerebral atrophy, within normal limits for age. ORBITS: Bilateral cataract extraction. SINUSES AND MASTOIDS: No acute abnormality. SOFT TISSUES AND SKULL: No acute skull fracture. No acute soft tissue abnormality. Calcified atherosclerosis at the skull base. CT CERVICAL SPINE BONES AND ALIGNMENT: No acute fracture or suspicious lesion. Straightening of the normal cervical lordosis. Trace anterolisthesis of C5 on C6. DEGENERATIVE CHANGES: Moderate median C1-2 arthropathy. Similar appearance of diffuse cervical spondylosis and advanced disc space narrowing at C6-7 where disc bulging and uncovertebral spurring result in at least mild spinal stenosis and moderate right and severe left neural foraminal stenosis. Moderate-to-severe facet arthrosis bilaterally at C3-4 with moderate bilateral neural foraminal stenosis. Partial bilateral facet ankylosis at C2-3. SOFT TISSUES: No prevertebral soft tissue swelling. IMPRESSION: 1. No evidence of  acute intracranial or cervical spine injury. 2. Chronic cerebellar infarcts. Electronically signed by: Dasie Hamburg MD 12/17/2023 04:54 PM EDT RP Workstation: [id number]     Medications Ordered in the ED - No data to display                                  Medical Decision Making Amount and/or Complexity of Data Reviewed Radiology: ordered.   Patient presents to the ED for concern of head injury, this involves an extensive number of treatment options, and is a complaint that carries with it a high risk of complications and morbidity.  The differential diagnosis includes fracture, contusion, dislocation, concussion, ICH   Co morbidities that complicate the patient evaluation  Previous history of stroke (on Plavix )   Additional history obtained:  Additional history obtained from Nursing   External records from outside source obtained and reviewed including triage note    Imaging Studies ordered:  I ordered imaging studies including CT head, cervical spine without contrast I independently visualized and interpreted imaging which showed no acute intracranial abnormality I agree with the radiologist interpretation     Problem List / ED Course:  Minor head injury Has absolutely no complaints.  No complaints of dizziness nor blurred vision or visual disturbances No signs of significant head trauma on exam.  No hematoma, no raccoon eyes, nor Battle sign No other injuries reported by patient nor found on physical exam as noted above Neuro intact per his baseline.  Motor and sensation intact. No complaints prior nor following to head injury so do not think labs are warranted Patient has had home with wife who can monitor him. Discussed strict return precautions to include signs of AMS, seizures, numbness or weakness in one-sided body, new slurred speech   Reevaluation:  After the interventions noted above, I reevaluated the patient and found that they have :stayed the  same   Social Determinants of Health:  Former tobacco abuse Has PCP follow-up  Lives at home with wife   Dispostion:  After consideration of the diagnostic results and the patients response to treatment, I feel that the patent would benefit from outpatient management symptomatic care and PCP follow-up as needed.   Discussed ED workup, disposition, return to ED precautions with patient who expresses understanding agrees with plan.  All questions answered to their satisfaction.  They are agreeable to plan.  Discharge instructions provided on paperwork  Final diagnoses:  Fall, initial encounter  Minor head injury, initial encounter    ED Discharge Orders     None        Minnie Tinnie BRAVO, PA 12/17/23 [id number]    Mannie Pac T, DO 12/20/23 1616

## 2023-12-17 NOTE — Discharge Instructions (Signed)
 Thank you for letting us  evaluate you today.  Your CT head did not show any bleeding in your brain.  There is no signs of fracture on your CT spine.   Return to Emergency Department if you experience numbness or weakness in one-sided your body, altered mentation, seizures, sudden acute worst headache of your life, loss of vision, blurred vision at rest, worsening symptoms

## 2023-12-18 DIAGNOSIS — I130 Hypertensive heart and chronic kidney disease with heart failure and stage 1 through stage 4 chronic kidney disease, or unspecified chronic kidney disease: Secondary | ICD-10-CM | POA: Diagnosis not present

## 2023-12-18 DIAGNOSIS — I5042 Chronic combined systolic (congestive) and diastolic (congestive) heart failure: Secondary | ICD-10-CM | POA: Diagnosis not present

## 2023-12-18 DIAGNOSIS — I69392 Facial weakness following cerebral infarction: Secondary | ICD-10-CM | POA: Diagnosis not present

## 2023-12-18 DIAGNOSIS — E1122 Type 2 diabetes mellitus with diabetic chronic kidney disease: Secondary | ICD-10-CM | POA: Diagnosis not present

## 2023-12-18 DIAGNOSIS — N184 Chronic kidney disease, stage 4 (severe): Secondary | ICD-10-CM | POA: Diagnosis not present

## 2023-12-18 DIAGNOSIS — I69351 Hemiplegia and hemiparesis following cerebral infarction affecting right dominant side: Secondary | ICD-10-CM | POA: Diagnosis not present

## 2023-12-20 DIAGNOSIS — I130 Hypertensive heart and chronic kidney disease with heart failure and stage 1 through stage 4 chronic kidney disease, or unspecified chronic kidney disease: Secondary | ICD-10-CM | POA: Diagnosis not present

## 2023-12-20 DIAGNOSIS — I69351 Hemiplegia and hemiparesis following cerebral infarction affecting right dominant side: Secondary | ICD-10-CM | POA: Diagnosis not present

## 2023-12-20 DIAGNOSIS — I69392 Facial weakness following cerebral infarction: Secondary | ICD-10-CM | POA: Diagnosis not present

## 2023-12-20 DIAGNOSIS — N184 Chronic kidney disease, stage 4 (severe): Secondary | ICD-10-CM | POA: Diagnosis not present

## 2023-12-20 DIAGNOSIS — I5042 Chronic combined systolic (congestive) and diastolic (congestive) heart failure: Secondary | ICD-10-CM | POA: Diagnosis not present

## 2023-12-20 DIAGNOSIS — E1122 Type 2 diabetes mellitus with diabetic chronic kidney disease: Secondary | ICD-10-CM | POA: Diagnosis not present

## 2023-12-21 ENCOUNTER — Telehealth: Payer: Self-pay

## 2023-12-21 NOTE — Telephone Encounter (Signed)
 Spoke w/Brittany at [facility] regarding Pt upcoming dental sx. [place] reported a sx date has not been set as the office is waiting on sx clearance from neurologist. Reported their office has received clearance from Pt cardiologist to hold ASA for 7 days from a cardiology standpoint but cardiology referred to neurology for clearance to hold Plavix  given Pt recent hx of 2nd CVA on 6/14. [place] reported Pt Dtr has cld their office stating Pt did not want to eat d/t pain. Per [place], Pt was prescribed clindamycin 300 mg x 7days starting on 12/13/23 but family is concerned about Pt pain and not wanting to eat. Discussed w/Brittany that attending neurologist is aware of surgical clearance request but did not sign form yet. Reached out to out oncall provider who referred back to attending neurologist d/t Pt recent CVA. Also discussed that usually we recommend waiting 3 to 6 months post CVA to consider holding ASA and/or Plavix  and Pt last CVA was 6/14 shortly after a CVA in May. [place] voiced understanding. Informed [place] we will speak w/attending neurologist on Tuesday. Asked if Pt would be receiving another ABT for abscess until sx. [place] stated not sure regarding ABT and will notify surgeon the clearance form will be addressed again on Tuesday. [place] voiced thanks for the information and f/u call.

## 2023-12-21 NOTE — Telephone Encounter (Signed)
 Received surgical clearance form for Pt dental surgery. No date of surgery noted on form. MR informed Pt wife noted it as urgent. Pt has abscess of a tooth and will need extraction under anesthesia. Pt has recent hx (less than 3 mos) of stroke. Placed call to Oral Surgery office for possible surgical dates and further details regarding Pt treatment if unsafe for Pt to have surgery at this time. Call sent office VM, LVM for call back asap.

## 2023-12-25 NOTE — Telephone Encounter (Signed)
 Oral surgical clearance form signed and faxed to [facility].

## 2023-12-26 DIAGNOSIS — N184 Chronic kidney disease, stage 4 (severe): Secondary | ICD-10-CM | POA: Diagnosis not present

## 2023-12-26 DIAGNOSIS — I69351 Hemiplegia and hemiparesis following cerebral infarction affecting right dominant side: Secondary | ICD-10-CM | POA: Diagnosis not present

## 2023-12-26 DIAGNOSIS — I5042 Chronic combined systolic (congestive) and diastolic (congestive) heart failure: Secondary | ICD-10-CM | POA: Diagnosis not present

## 2023-12-26 DIAGNOSIS — E1122 Type 2 diabetes mellitus with diabetic chronic kidney disease: Secondary | ICD-10-CM | POA: Diagnosis not present

## 2023-12-26 DIAGNOSIS — I130 Hypertensive heart and chronic kidney disease with heart failure and stage 1 through stage 4 chronic kidney disease, or unspecified chronic kidney disease: Secondary | ICD-10-CM | POA: Diagnosis not present

## 2023-12-26 DIAGNOSIS — I69392 Facial weakness following cerebral infarction: Secondary | ICD-10-CM | POA: Diagnosis not present

## 2023-12-27 DIAGNOSIS — N184 Chronic kidney disease, stage 4 (severe): Secondary | ICD-10-CM | POA: Diagnosis not present

## 2023-12-27 DIAGNOSIS — K219 Gastro-esophageal reflux disease without esophagitis: Secondary | ICD-10-CM | POA: Diagnosis not present

## 2023-12-27 DIAGNOSIS — I2510 Atherosclerotic heart disease of native coronary artery without angina pectoris: Secondary | ICD-10-CM | POA: Diagnosis not present

## 2023-12-27 DIAGNOSIS — M109 Gout, unspecified: Secondary | ICD-10-CM | POA: Diagnosis not present

## 2023-12-27 DIAGNOSIS — N39498 Other specified urinary incontinence: Secondary | ICD-10-CM | POA: Diagnosis not present

## 2023-12-27 DIAGNOSIS — I428 Other cardiomyopathies: Secondary | ICD-10-CM | POA: Diagnosis not present

## 2023-12-27 DIAGNOSIS — E669 Obesity, unspecified: Secondary | ICD-10-CM | POA: Diagnosis not present

## 2023-12-27 DIAGNOSIS — I453 Trifascicular block: Secondary | ICD-10-CM | POA: Diagnosis not present

## 2023-12-27 DIAGNOSIS — E1142 Type 2 diabetes mellitus with diabetic polyneuropathy: Secondary | ICD-10-CM | POA: Diagnosis not present

## 2023-12-27 DIAGNOSIS — E1122 Type 2 diabetes mellitus with diabetic chronic kidney disease: Secondary | ICD-10-CM | POA: Diagnosis not present

## 2023-12-27 DIAGNOSIS — K5900 Constipation, unspecified: Secondary | ICD-10-CM | POA: Diagnosis not present

## 2023-12-27 DIAGNOSIS — R338 Other retention of urine: Secondary | ICD-10-CM | POA: Diagnosis not present

## 2023-12-27 DIAGNOSIS — B961 Klebsiella pneumoniae [K. pneumoniae] as the cause of diseases classified elsewhere: Secondary | ICD-10-CM | POA: Diagnosis not present

## 2023-12-27 DIAGNOSIS — N390 Urinary tract infection, site not specified: Secondary | ICD-10-CM | POA: Diagnosis not present

## 2023-12-27 DIAGNOSIS — R1312 Dysphagia, oropharyngeal phase: Secondary | ICD-10-CM | POA: Diagnosis not present

## 2023-12-27 DIAGNOSIS — I5042 Chronic combined systolic (congestive) and diastolic (congestive) heart failure: Secondary | ICD-10-CM | POA: Diagnosis not present

## 2023-12-27 DIAGNOSIS — G4733 Obstructive sleep apnea (adult) (pediatric): Secondary | ICD-10-CM | POA: Diagnosis not present

## 2023-12-27 DIAGNOSIS — I69351 Hemiplegia and hemiparesis following cerebral infarction affecting right dominant side: Secondary | ICD-10-CM | POA: Diagnosis not present

## 2023-12-27 DIAGNOSIS — N401 Enlarged prostate with lower urinary tract symptoms: Secondary | ICD-10-CM | POA: Diagnosis not present

## 2023-12-27 DIAGNOSIS — I69392 Facial weakness following cerebral infarction: Secondary | ICD-10-CM | POA: Diagnosis not present

## 2023-12-27 DIAGNOSIS — E785 Hyperlipidemia, unspecified: Secondary | ICD-10-CM | POA: Diagnosis not present

## 2023-12-27 DIAGNOSIS — I471 Supraventricular tachycardia, unspecified: Secondary | ICD-10-CM | POA: Diagnosis not present

## 2023-12-27 DIAGNOSIS — I272 Pulmonary hypertension, unspecified: Secondary | ICD-10-CM | POA: Diagnosis not present

## 2023-12-27 DIAGNOSIS — E1151 Type 2 diabetes mellitus with diabetic peripheral angiopathy without gangrene: Secondary | ICD-10-CM | POA: Diagnosis not present

## 2023-12-27 DIAGNOSIS — I130 Hypertensive heart and chronic kidney disease with heart failure and stage 1 through stage 4 chronic kidney disease, or unspecified chronic kidney disease: Secondary | ICD-10-CM | POA: Diagnosis not present

## 2023-12-28 DIAGNOSIS — N184 Chronic kidney disease, stage 4 (severe): Secondary | ICD-10-CM | POA: Diagnosis not present

## 2023-12-31 DIAGNOSIS — I5042 Chronic combined systolic (congestive) and diastolic (congestive) heart failure: Secondary | ICD-10-CM | POA: Diagnosis not present

## 2023-12-31 DIAGNOSIS — N184 Chronic kidney disease, stage 4 (severe): Secondary | ICD-10-CM | POA: Diagnosis not present

## 2023-12-31 DIAGNOSIS — I69351 Hemiplegia and hemiparesis following cerebral infarction affecting right dominant side: Secondary | ICD-10-CM | POA: Diagnosis not present

## 2023-12-31 DIAGNOSIS — E1122 Type 2 diabetes mellitus with diabetic chronic kidney disease: Secondary | ICD-10-CM | POA: Diagnosis not present

## 2023-12-31 DIAGNOSIS — I130 Hypertensive heart and chronic kidney disease with heart failure and stage 1 through stage 4 chronic kidney disease, or unspecified chronic kidney disease: Secondary | ICD-10-CM | POA: Diagnosis not present

## 2023-12-31 DIAGNOSIS — I69392 Facial weakness following cerebral infarction: Secondary | ICD-10-CM | POA: Diagnosis not present

## 2024-01-01 DIAGNOSIS — I69392 Facial weakness following cerebral infarction: Secondary | ICD-10-CM | POA: Diagnosis not present

## 2024-01-01 DIAGNOSIS — I5042 Chronic combined systolic (congestive) and diastolic (congestive) heart failure: Secondary | ICD-10-CM | POA: Diagnosis not present

## 2024-01-01 DIAGNOSIS — I69351 Hemiplegia and hemiparesis following cerebral infarction affecting right dominant side: Secondary | ICD-10-CM | POA: Diagnosis not present

## 2024-01-01 DIAGNOSIS — N184 Chronic kidney disease, stage 4 (severe): Secondary | ICD-10-CM | POA: Diagnosis not present

## 2024-01-01 DIAGNOSIS — E1122 Type 2 diabetes mellitus with diabetic chronic kidney disease: Secondary | ICD-10-CM | POA: Diagnosis not present

## 2024-01-01 DIAGNOSIS — I130 Hypertensive heart and chronic kidney disease with heart failure and stage 1 through stage 4 chronic kidney disease, or unspecified chronic kidney disease: Secondary | ICD-10-CM | POA: Diagnosis not present

## 2024-01-02 NOTE — Progress Notes (Signed)
 [facility] PCP:  Allen Slater, MD  Primary Cardiologist:  Allen Bihari, MD Nephrology: Dr. Dennise SERUM HF Cardiologist: Dr Rolan   HPI: Allen Dyer is a 88 y.o. male with a history of DM, rheumatic fever, HTN, hyperlipidemia, CKD IV, prostate CA s/p XRT, systolic HF due to NICM (EF 30-35%) 05/2018, pulmonary venous HTN, and mild nonobstructive CAD.  He served for > 20 years in the [employer] including in [place].    Admitted 3/11-3/18/20 with volume overload. Diuresed with IV lasix . HF team consulted with concern for pulmonary HTN due to ongoing oxygen need. RHC completed and showed moderate pulmonary venous HTN with normal PVR 1.7 (no PAH).   Patient was diagnosed with smoldering myeloma in 7/20, no treatment planned at this time.   Cardiolite in 2/21 showed EF 36%, fixed anteroapical defect, no ischemia.  Echo in 11/21 showed EF 30-35%, diffuse hypokinesis, normal RV, PASP 45 mmHg, dilated IVC.   Seen in ED 08/05/21 with blurred vision. Felt to be lightheadedness vs vertigo. Orthostatics negative. He had PCP follow up and was diagnosed with vertigo.  CVA in 5/23, echo showed EF 35-40%, mild LVH, normal RV.  Carotid dopplers showed minimal disease.  MRA head showed severe vertebral artery disease bilaterally.   Admitted 5/25 with CVA. MRI showed subacute stroke. CT angiogram showed significant vertebral artery stenosis. Echo showed EF 25-30%, G1DD, normal RV. He was discharged to CIR. Re-admitted 6/25 with new CVA, as well as subacute strokes. Neuro recommending DAPT indefinitely, cardiac monitor placed at discharge.  Zio 7/25 showed most NSR, 5% PVC burden. Seen in ED 8/25 for a fall, CT head negative for bleed.  Today he returns for HF follow up with his daughter. Overall feeling fine. He is able to walk short distances with his rolling walker when working with PT. He does not have dyspnea with this. He has left sided weakness and dysarthria, post CVA. Denies  palpitations, abnormal bleeding, CP, dizziness, edema, or PND/Orthopnea. Appetite ok. Taking all medications, daughter occasionally gives him extra 20 mg torsemide  PRN. Working with Dr Dyer on new CPAP and mask.  ECG (personally reviewed): none ordered today.  Labs (6/25): K 3.5, creatinine 1.65, LDL 73 Labs (9/25): K 3.8, creatinine 1.93  PMH: 1. CKD: Stage 3.  2. Hyperlipidemia: Statin intolerant.  3. HTN 4. Smoldering myeloma: Diagnosed in 7/20.  - Bone marrow biopsy stained negative for amyloidosis.  5. Prostate cancer s/p XRT.  6. Type 2 diabetes.  7. Chronic systolic CHF: Nonischemic cardiomyopathy.  - RHC/LHC (2/20): Nonobstructive CAD with 50% pLAD; mean RA 13, PA 72/27 mean 47, mean PCWP 29, CI 2.8, PVR 3 WU - Echo (2/20): EF 30-35%.  - RHC (3/20): mean RA 9, PA 55/20 mean 32, mean PCWP 20, CI 3.29, PVR 1.7 WU - Echo (7/20): EF 25-30%, moderate LV dilation without LVH, normal RV size and systolic function, mild-moderate AI.  - Cardiolite (2/21): EF 36%, fixed anteroapical defect, no ischemia.  - Echo (11/21): EF 30-35%, diffuse hypokinesis, normal RV, PASP 45 mmHg, dilated IVC.  - Echo (3/23): EF 30-35%, moderately decreased LV with global HK, grade I DD, normal RV, ascending aorta dilation 44 mm. - Echo (5/23): EF 35-40%, mild LVH, normal RV - Echo 5/25: EF 25-30%, normal RV. 8. Pulmonary venous hypertension.  - V/Q scan (3/20): No evidence for chronic PE.  9. OSA: Using CPAP.  10. ABIs normal in 1/23 11. CVA: 5/23 and 6/25.  - Carotid dopplers (5/23):  minimal disease.  - MRA head: no flow right vertebral, severe stenosis left vertebral.  - Carotid dopplers (6/25):  - MRA (6/25): Known bilateral vertebral artery occlusions. Unchanged advanced anterior circulation atherosclerosis including severe right and moderate left ICA stenoses.  - Zio 7/235: mostly NSR, 5% PVC burden  Past Surgical History:  Procedure Laterality Date   RIGHT HEART CATH N/A 07/10/2018    Procedure: RIGHT HEART CATH;  Surgeon: [name], MD;  Location: [facility] INVASIVE CV LAB;  Service: Cardiovascular;  Laterality: N/A;   RIGHT/LEFT HEART CATH AND CORONARY ANGIOGRAPHY N/A 06/18/2018   Procedure: RIGHT/LEFT HEART CATH AND CORONARY ANGIOGRAPHY;  Surgeon: Burnard Debby LABOR, MD;  Location: [facility] INVASIVE CV LAB;  Service: Cardiovascular;  Laterality: N/A;   Current Outpatient Medications  Medication Sig Dispense Refill   acetaminophen  (TYLENOL ) 325 MG tablet Take 2 tablets (650 mg total) by mouth every 4 (four) hours as needed for mild pain (pain score 1-3) (or temp > 37.5 C (99.5 F)).     Ascorbic Acid (VITAMIN C) 1000 MG tablet Take 4,000 mg by mouth every morning.     aspirin  81 MG chewable tablet Chew 81 mg by mouth daily.     Blood Glucose Monitoring Suppl (FREESTYLE LITE) w/Device KIT 1 Device by Does not apply route daily in the afternoon. 1 kit 0   carvedilol  (COREG ) 6.25 MG tablet Take 1 tablet (6.25 mg total) by mouth 2 (two) times daily with a meal. 60 tablet 5   clopidogrel  (PLAVIX ) 75 MG tablet Take 1 tablet (75 mg total) by mouth daily. 30 tablet 0   cyanocobalamin  1000 MCG tablet Take 1 tablet (1,000 mcg total) by mouth every morning. 30 tablet 0   ezetimibe  (ZETIA ) 10 MG tablet Take 1 tablet (10 mg total) by mouth daily. 90 tablet 3   Garlic 1000 MG CAPS Take 1,000 mg by mouth every morning.     glipiZIDE  (GLUCOTROL ) 5 MG tablet Take by mouth 2 (two) times daily before a meal.     glucose blood (FREESTYLE LITE) test strip 1 each by Other route daily in the afternoon. Use as instructed 100 each 3   isosorbide  mononitrate (IMDUR ) 30 MG 24 hr tablet Take 0.5 tablets (15 mg total) by mouth every morning.     Omega-3 Fatty Acids (FISH OIL) 1200 MG CAPS Take 1,200 mg by mouth daily.     potassium chloride  SA (KLOR-CON  M) 20 MEQ tablet Take 2 tablets (40 mEq total) by mouth daily. 30 tablet 0   scopolamine  (TRANSDERM-SCOP) 1 MG/3DAYS Place 1 patch (1.5 mg total) onto the skin  every 3 (three) days. 30 patch 12   tamsulosin  (FLOMAX ) 0.4 MG CAPS capsule Take 1 capsule (0.4 mg total) by mouth 2 (two) times daily. 60 capsule 0   torsemide  (DEMADEX ) 20 MG tablet Take 2 tablets (40 mg total) by mouth daily. 180 tablet 3   No current facility-administered medications for this encounter.    Allergies:   Tramadol, Finasteride, Jardiance  [empagliflozin ], Lisinopril, Ciprofloxacin, Simvastatin, and Sulfa antibiotics   Social History:  The patient  reports that he quit smoking about 52 years ago. His smoking use included cigarettes. He started smoking about 62 years ago. He has a 20 pack-year smoking history. He has never used smokeless tobacco. He reports that he does not currently use alcohol. He reports that he does not use drugs.   Family History:  The patient's family history includes Cancer in his mother and sister; Congestive Heart Failure in  his brother and father.   ROS:  Please see the history of present illness.   All other systems are personally reviewed and negative.   Wt Readings from Last 3 Encounters:  01/04/24 87.8 kg (193 lb 9.6 oz)  12/13/23 89.7 kg (197 lb 12.8 oz)  12/11/23 89.9 kg (198 lb 3.2 oz)   BP 128/64   Pulse 67   Ht 5' 7.5 (1.715 m)   Wt 87.8 kg (193 lb 9.6 oz)   SpO2 95%   BMI 29.87 kg/m   Physical Exam General:  NAD. No resp difficulty, arrived in [facility], elderly HEENT: Normal Neck: Supple. No JVD. Cor: Regular rate & rhythm. No rubs, gallops or murmurs. Lungs: Clear, diminished in bases Abdomen: Soft, nontender, nondistended.  Extremities: No cyanosis, clubbing, rash, 1+ BLE pre-tibial edema L>R Neuro: Alert & oriented x 3, moves all 4 extremities, + weak LUE, mild dysarthria. Affect pleasant.  Assessment & Plan: 1. Chronic systolic HF:  New diagnosis 05/2018, EF 30-35% by echo. Due to NICM. LHC 05/2018 with mild non-osbtructive CAD (50% proximal LAD stenosis). Has had high BP for a long time, but it has generally been controlled  recently. Had palpitations in past, but very few PVCs on telemetry when in the hospital. Prior hx of ETOH abuse, but none in 40+ years. Did not receive chemotherapy with prior prostate cancer. QRS is wide on EKG, but it is RBBB.  He has multiple myeloma but he does not have ventricular wall thickening that would be suggestive of cardiac amyloidosis.  RHC 3/20 with mildly elevated filling pressures, CO preserved, pulmonary venous HTN. No PAH.  Echo in 7/20 showed that EF remains low at 25-30% with moderate LV dilation, no LVH, and normal RV.  Echo in 11/21 showed EF 30-35%, diffuse hypokinesis, normal RV, PASP 45 mmHg, dilated IVC. Echo (3/23) EF 30-35%, echo in 5/23 showed EF 35-40%, mild LVH, normal RV.  Echo 5/25 showed EF 25-30%, normal RV. NYHA II, functional status confounded by CVA and deconditioning. He is not volume overloaded on exam. - Continue torsemide  40 mg daily, OK to take extra 20 mg PRN. Labs reviewed from yesterday and stable, K 3.8, creatinine 1.93 - Continue carvedilol  6.25 mg bid.   - Continue Imdur  15 mg daily, he is no longer taking hydralazine . - No MRA/ACE/ARB/ARNI with CKD stage 3.  - Off SGLT2i with yeast infection/frequent UTIs. - With advanced age and nonischemic etiology of cardiomyopathy, would avoid ICD.  He has RBBB so would not likely benefit from CRT.  2. CKD Stage IV: Last creatinine 1.93. - Follows with Nephrology - No SGLT2i with urinary retention/yeast infections 3. CAD: 50% pLAD on LHC in 2/20. Cardiolite in 2/21 showed no ischemia.  No chest pain. - Continue Zetia  10 mg daily. Does not tolerate statins. Last LDL 73 - Continue ASA 81.   4. Severe OSA: Continue CPAP.  - Follows with Dr. Shlomo 5. Pulmonary hypertension: He had pulmonary venous hypertension based on prior RHC.  6. Smoldering myeloma:  No plans for chemotherapy treatment.  - Sees Dr Lonn.  7. CVA: In 5/23.  He has severe vertebral artery disease.  New CVA 5/25 to bilateral cerebellar region  and 6/25 to R pons. Event monitor showed no AF/AFL.  - Continue DAPT with ASA/Plavix  indefinitely per Neuro - Continue Zetia . Has not tolerated statins.   Follow up with Dr. Rolan as scheduled.  Harlene HERO [place], [place]  01/04/2024

## 2024-01-03 ENCOUNTER — Inpatient Hospital Stay (HOSPITAL_BASED_OUTPATIENT_CLINIC_OR_DEPARTMENT_OTHER): Admitting: Hematology and Oncology

## 2024-01-03 ENCOUNTER — Ambulatory Visit: Payer: Medicare Other | Admitting: Hematology and Oncology

## 2024-01-03 ENCOUNTER — Inpatient Hospital Stay: Attending: Hematology and Oncology

## 2024-01-03 ENCOUNTER — Encounter: Payer: Self-pay | Admitting: Hematology and Oncology

## 2024-01-03 ENCOUNTER — Other Ambulatory Visit: Payer: Medicare Other

## 2024-01-03 VITALS — BP 117/65 | HR 69 | Temp 97.8°F | Resp 18

## 2024-01-03 DIAGNOSIS — E1122 Type 2 diabetes mellitus with diabetic chronic kidney disease: Secondary | ICD-10-CM | POA: Diagnosis not present

## 2024-01-03 DIAGNOSIS — N184 Chronic kidney disease, stage 4 (severe): Secondary | ICD-10-CM

## 2024-01-03 DIAGNOSIS — I69351 Hemiplegia and hemiparesis following cerebral infarction affecting right dominant side: Secondary | ICD-10-CM | POA: Diagnosis not present

## 2024-01-03 DIAGNOSIS — I69392 Facial weakness following cerebral infarction: Secondary | ICD-10-CM | POA: Diagnosis not present

## 2024-01-03 DIAGNOSIS — D539 Nutritional anemia, unspecified: Secondary | ICD-10-CM

## 2024-01-03 DIAGNOSIS — D472 Monoclonal gammopathy: Secondary | ICD-10-CM

## 2024-01-03 DIAGNOSIS — I130 Hypertensive heart and chronic kidney disease with heart failure and stage 1 through stage 4 chronic kidney disease, or unspecified chronic kidney disease: Secondary | ICD-10-CM | POA: Diagnosis not present

## 2024-01-03 DIAGNOSIS — I5042 Chronic combined systolic (congestive) and diastolic (congestive) heart failure: Secondary | ICD-10-CM | POA: Diagnosis not present

## 2024-01-03 LAB — CBC WITH DIFFERENTIAL/PLATELET
Abs Immature Granulocytes: 0.01 K/uL (ref 0.00–0.07)
Basophils Absolute: 0 K/uL (ref 0.0–0.1)
Basophils Relative: 0 %
Eosinophils Absolute: 0.1 K/uL (ref 0.0–0.5)
Eosinophils Relative: 3 %
HCT: 28.9 % — ABNORMAL LOW (ref 39.0–52.0)
Hemoglobin: 9.4 g/dL — ABNORMAL LOW (ref 13.0–17.0)
Immature Granulocytes: 0 %
Lymphocytes Relative: 16 %
Lymphs Abs: 0.8 K/uL (ref 0.7–4.0)
MCH: 33.3 pg (ref 26.0–34.0)
MCHC: 32.5 g/dL (ref 30.0–36.0)
MCV: 102.5 fL — ABNORMAL HIGH (ref 80.0–100.0)
Monocytes Absolute: 0.6 K/uL (ref 0.1–1.0)
Monocytes Relative: 11 %
Neutro Abs: 3.6 K/uL (ref 1.7–7.7)
Neutrophils Relative %: 70 %
Platelets: 170 K/uL (ref 150–400)
RBC: 2.82 MIL/uL — ABNORMAL LOW (ref 4.22–5.81)
RDW: 13.2 % (ref 11.5–15.5)
WBC: 5.2 K/uL (ref 4.0–10.5)
nRBC: 0 % (ref 0.0–0.2)

## 2024-01-03 LAB — COMPREHENSIVE METABOLIC PANEL WITH GFR
ALT: 9 U/L (ref 0–44)
AST: 12 U/L — ABNORMAL LOW (ref 15–41)
Albumin: 3.4 g/dL — ABNORMAL LOW (ref 3.5–5.0)
Alkaline Phosphatase: 57 U/L (ref 38–126)
Anion gap: 4 — ABNORMAL LOW (ref 5–15)
BUN: 15 mg/dL (ref 8–23)
CO2: 35 mmol/L — ABNORMAL HIGH (ref 22–32)
Calcium: 9.2 mg/dL (ref 8.9–10.3)
Chloride: 104 mmol/L (ref 98–111)
Creatinine, Ser: 1.93 mg/dL — ABNORMAL HIGH (ref 0.61–1.24)
GFR, Estimated: 33 mL/min — ABNORMAL LOW (ref >60)
Glucose, Bld: 201 mg/dL — ABNORMAL HIGH (ref 70–99)
Potassium: 3.8 mmol/L (ref 3.5–5.1)
Sodium: 143 mmol/L (ref 135–145)
Total Bilirubin: 0.4 mg/dL (ref 0.0–1.2)
Total Protein: 6.7 g/dL (ref 6.5–8.1)

## 2024-01-03 NOTE — Progress Notes (Signed)
 [facility] OFFICE PROGRESS NOTE  Patient Care Team: Regino Slater, MD as PCP - General (Family Medicine) Shlomo Wilbert SAUNDERS, MD as PCP - Cardiology (Cardiology) [name], MD as PCP - Advanced Heart Failure (Cardiology) Slusher, Santana LABOR, RN as Registered Nurse  ASSESSMENT & PLAN:  Assessment & Plan MGUS (monoclonal gammopathy of unknown significance) The patient has MGUS/smoldering myeloma, IgA kappa subtype,diagnosed with bone marrow biopsy in July 2020.  He had normal cytogenetics, deletion 13 q. is noted, [place] red stain was negative for amyloid  He has chronic anemia and chronic kidney disease unrelated to his diagnosis of MGUS/myeloma and is on observation He is not symptomatic Denies severe recurrent infection, bone fracture or other sequelae from MGUS I will continue to see him once a year Deficiency anemia He has multifactorial anemia, likely related to anemia chronic kidney disease Observe closely Previously, I have ordered serum B12 and iron levels and they were within normal range He is not symptomatic Chronic kidney disease (CKD), stage IV (severe) (HCC) He has intermittent elevated creatinine on and off over the years We discussed importance of adequate hydration We discussed limitations and difficulties in interpreting his light chain level due to elevated serum creatinine His chronic renal failure is not due to MGUS/myeloma  No orders of the defined types were placed in this encounter.    INTERVAL HISTORY: he returns for surveillance follow-up for diagnosis of MGUS Patient denies recurrent infection or bone pain He is getting weaker and has lost weight since diagnosis of stroke He is on modified soft diet We discussed natural history of MGUS and the rationale behind yearly follow-up  PHYSICAL EXAMINATION: ECOG PERFORMANCE STATUS: 1 - Symptomatic but completely ambulatory  Vitals:   01/03/24 1131  BP: 117/65  Pulse: 69  Resp: 18  Temp:  97.8 F (36.6 C)  SpO2: 98%

## 2024-01-03 NOTE — Assessment & Plan Note (Addendum)
He has multifactorial anemia, likely related to anemia chronic kidney disease Observe closely Previously, I have ordered serum B12 and iron levels and they were within normal range He is not symptomatic 

## 2024-01-03 NOTE — Assessment & Plan Note (Addendum)
 The patient has MGUS/smoldering myeloma, IgA kappa subtype,diagnosed with bone marrow biopsy in July 2020.  He had normal cytogenetics, deletion 13 q. is noted, [place] red stain was negative for amyloid  He has chronic anemia and chronic kidney disease unrelated to his diagnosis of MGUS/myeloma and is on observation He is not symptomatic Denies severe recurrent infection, bone fracture or other sequelae from MGUS I will continue to see him once a year

## 2024-01-03 NOTE — Assessment & Plan Note (Addendum)
 He has intermittent elevated creatinine on and off over the years We discussed importance of adequate hydration We discussed limitations and difficulties in interpreting his light chain level due to elevated serum creatinine His chronic renal failure is not due to MGUS/myeloma

## 2024-01-04 ENCOUNTER — Encounter (HOSPITAL_COMMUNITY): Payer: Self-pay

## 2024-01-04 ENCOUNTER — Ambulatory Visit (HOSPITAL_COMMUNITY)
Admission: RE | Admit: 2024-01-04 | Discharge: 2024-01-04 | Disposition: A | Discharge status: Home / Self Care | Source: Ambulatory Visit | Attending: Family Medicine | Admitting: Family Medicine

## 2024-01-04 VITALS — BP 128/64 | HR 67 | Ht 67.5 in | Wt 193.6 lb

## 2024-01-04 DIAGNOSIS — I5022 Chronic systolic (congestive) heart failure: Secondary | ICD-10-CM | POA: Insufficient documentation

## 2024-01-04 DIAGNOSIS — I639 Cerebral infarction, unspecified: Secondary | ICD-10-CM

## 2024-01-04 DIAGNOSIS — I0981 Rheumatic heart failure: Secondary | ICD-10-CM | POA: Insufficient documentation

## 2024-01-04 DIAGNOSIS — Z7902 Long term (current) use of antithrombotics/antiplatelets: Secondary | ICD-10-CM | POA: Diagnosis not present

## 2024-01-04 DIAGNOSIS — E1122 Type 2 diabetes mellitus with diabetic chronic kidney disease: Secondary | ICD-10-CM | POA: Insufficient documentation

## 2024-01-04 DIAGNOSIS — I130 Hypertensive heart and chronic kidney disease with heart failure and stage 1 through stage 4 chronic kidney disease, or unspecified chronic kidney disease: Secondary | ICD-10-CM | POA: Insufficient documentation

## 2024-01-04 DIAGNOSIS — I2510 Atherosclerotic heart disease of native coronary artery without angina pectoris: Secondary | ICD-10-CM | POA: Diagnosis not present

## 2024-01-04 DIAGNOSIS — N184 Chronic kidney disease, stage 4 (severe): Secondary | ICD-10-CM | POA: Diagnosis not present

## 2024-01-04 DIAGNOSIS — Z79899 Other long term (current) drug therapy: Secondary | ICD-10-CM | POA: Insufficient documentation

## 2024-01-04 DIAGNOSIS — G4733 Obstructive sleep apnea (adult) (pediatric): Secondary | ICD-10-CM | POA: Insufficient documentation

## 2024-01-04 DIAGNOSIS — I4510 Unspecified right bundle-branch block: Secondary | ICD-10-CM | POA: Diagnosis not present

## 2024-01-04 DIAGNOSIS — I69354 Hemiplegia and hemiparesis following cerebral infarction affecting left non-dominant side: Secondary | ICD-10-CM | POA: Diagnosis not present

## 2024-01-04 DIAGNOSIS — Z8546 Personal history of malignant neoplasm of prostate: Secondary | ICD-10-CM | POA: Diagnosis not present

## 2024-01-04 DIAGNOSIS — Z87891 Personal history of nicotine dependence: Secondary | ICD-10-CM | POA: Diagnosis not present

## 2024-01-04 DIAGNOSIS — I272 Pulmonary hypertension, unspecified: Secondary | ICD-10-CM | POA: Diagnosis not present

## 2024-01-04 DIAGNOSIS — E785 Hyperlipidemia, unspecified: Secondary | ICD-10-CM | POA: Insufficient documentation

## 2024-01-04 DIAGNOSIS — I428 Other cardiomyopathies: Secondary | ICD-10-CM | POA: Diagnosis not present

## 2024-01-04 DIAGNOSIS — Z923 Personal history of irradiation: Secondary | ICD-10-CM | POA: Insufficient documentation

## 2024-01-04 DIAGNOSIS — D472 Monoclonal gammopathy: Secondary | ICD-10-CM | POA: Diagnosis not present

## 2024-01-04 DIAGNOSIS — I6503 Occlusion and stenosis of bilateral vertebral arteries: Secondary | ICD-10-CM | POA: Diagnosis not present

## 2024-01-04 DIAGNOSIS — Z7984 Long term (current) use of oral hypoglycemic drugs: Secondary | ICD-10-CM | POA: Insufficient documentation

## 2024-01-04 DIAGNOSIS — I69322 Dysarthria following cerebral infarction: Secondary | ICD-10-CM | POA: Insufficient documentation

## 2024-01-04 DIAGNOSIS — Z7982 Long term (current) use of aspirin: Secondary | ICD-10-CM | POA: Insufficient documentation

## 2024-01-04 LAB — KAPPA/LAMBDA LIGHT CHAINS
Kappa free light chain: 189.9 mg/L — ABNORMAL HIGH (ref 3.3–19.4)
Kappa, lambda light chain ratio: 7.3 — ABNORMAL HIGH (ref 0.26–1.65)
Lambda free light chains: 26 mg/L (ref 5.7–26.3)

## 2024-01-04 MED ORDER — TORSEMIDE 20 MG PO TABS: ORAL_TABLET | ORAL | 3 refills | Status: DC

## 2024-01-04 NOTE — Patient Instructions (Signed)
  Follow-Up in: as scheduled with Dr. Mclean   At the [facility], you and your health needs are our priority. We have a designated team specialized in the treatment of Heart Failure. This Care Team includes your primary Heart Failure Specialized Cardiologist (physician), Advanced Practice Providers (APPs- Physician Assistants and Nurse Practitioners), and Pharmacist who all work together to provide you with the care you need, when you need it.   You may see any of the following providers on your designated Care Team at your next follow up:  Dr. Toribio Fuel Dr. Ezra Shuck Dr. Ria Commander Dr. Odis Brownie Greig Mosses, NP Caffie Shed, [place] [facility] [place], [place] Beckey Coe, NP [place] Lee, NP Tinnie Redman, PharmD   Please be sure to bring in all your medications bottles to every appointment.   Need to Contact Us :  If you have any questions or concerns before your next appointment please send us  a message through [place] or call our office at [phone].    TO LEAVE A MESSAGE FOR THE NURSE SELECT OPTION 2, PLEASE LEAVE A MESSAGE INCLUDING: YOUR NAME DATE OF BIRTH CALL BACK NUMBER REASON FOR CALL**this is important as we prioritize the call backs  YOU WILL RECEIVE A CALL BACK THE SAME DAY AS LONG AS YOU CALL BEFORE 4:00 PM

## 2024-01-07 DIAGNOSIS — E1122 Type 2 diabetes mellitus with diabetic chronic kidney disease: Secondary | ICD-10-CM | POA: Diagnosis not present

## 2024-01-07 DIAGNOSIS — I69351 Hemiplegia and hemiparesis following cerebral infarction affecting right dominant side: Secondary | ICD-10-CM | POA: Diagnosis not present

## 2024-01-07 DIAGNOSIS — N184 Chronic kidney disease, stage 4 (severe): Secondary | ICD-10-CM | POA: Diagnosis not present

## 2024-01-07 DIAGNOSIS — I5042 Chronic combined systolic (congestive) and diastolic (congestive) heart failure: Secondary | ICD-10-CM | POA: Diagnosis not present

## 2024-01-07 DIAGNOSIS — I69392 Facial weakness following cerebral infarction: Secondary | ICD-10-CM | POA: Diagnosis not present

## 2024-01-07 DIAGNOSIS — I130 Hypertensive heart and chronic kidney disease with heart failure and stage 1 through stage 4 chronic kidney disease, or unspecified chronic kidney disease: Secondary | ICD-10-CM | POA: Diagnosis not present

## 2024-01-07 LAB — MULTIPLE MYELOMA PANEL, SERUM
Albumin SerPl Elph-Mcnc: 3.1 g/dL (ref 2.9–4.4)
Albumin/Glob SerPl: 1.1 (ref 0.7–1.7)
Alpha 1: 0.3 g/dL (ref 0.0–0.4)
Alpha2 Glob SerPl Elph-Mcnc: 0.7 g/dL (ref 0.4–1.0)
B-Globulin SerPl Elph-Mcnc: 1.3 g/dL (ref 0.7–1.3)
Gamma Glob SerPl Elph-Mcnc: 0.9 g/dL (ref 0.4–1.8)
Globulin, Total: 3.1 g/dL (ref 2.2–3.9)
IgA: 754 mg/dL — ABNORMAL HIGH (ref 61–437)
IgG (Immunoglobin G), Serum: 977 mg/dL (ref 603–1613)
IgM (Immunoglobulin M), Srm: 27 mg/dL (ref 15–143)
M Protein SerPl Elph-Mcnc: 0.6 g/dL — ABNORMAL HIGH
Total Protein ELP: 6.2 g/dL (ref 6.0–8.5)

## 2024-01-08 ENCOUNTER — Ambulatory Visit: Payer: Self-pay | Admitting: Hematology and Oncology

## 2024-01-08 DIAGNOSIS — I69392 Facial weakness following cerebral infarction: Secondary | ICD-10-CM | POA: Diagnosis not present

## 2024-01-08 DIAGNOSIS — I5042 Chronic combined systolic (congestive) and diastolic (congestive) heart failure: Secondary | ICD-10-CM | POA: Diagnosis not present

## 2024-01-08 DIAGNOSIS — I130 Hypertensive heart and chronic kidney disease with heart failure and stage 1 through stage 4 chronic kidney disease, or unspecified chronic kidney disease: Secondary | ICD-10-CM | POA: Diagnosis not present

## 2024-01-08 DIAGNOSIS — N184 Chronic kidney disease, stage 4 (severe): Secondary | ICD-10-CM | POA: Diagnosis not present

## 2024-01-08 DIAGNOSIS — E1122 Type 2 diabetes mellitus with diabetic chronic kidney disease: Secondary | ICD-10-CM | POA: Diagnosis not present

## 2024-01-08 DIAGNOSIS — I69351 Hemiplegia and hemiparesis following cerebral infarction affecting right dominant side: Secondary | ICD-10-CM | POA: Diagnosis not present

## 2024-01-08 NOTE — Telephone Encounter (Signed)
 Called patient to relay myeloma panel results are stable and follow up in 1 year. Spoke with daughter and she verbalized understanding.

## 2024-01-08 NOTE — Telephone Encounter (Signed)
-----   Message from Almarie Bedford sent at 01/08/2024  9:49 AM EDT ----- Please call him, myeloma panel is stable, see him in 1 year ----- Message ----- From: Interface, Lab In [place] Sent: 01/03/2024  11:30 AM EDT To: Almarie Bedford, MD

## 2024-01-09 NOTE — Telephone Encounter (Signed)
 [facility] called to report the patient has missed two scheduled appointments for his mask fitting. Reached out to the patient Per DPR spoke to [name] (daughter) and she says the patient has been dealing with an abscessed tooth whitch makes him feel bad and is the reason for the missed appointments. Lucienne states the patient has an appointment next week to get it fixed.

## 2024-01-10 DIAGNOSIS — I130 Hypertensive heart and chronic kidney disease with heart failure and stage 1 through stage 4 chronic kidney disease, or unspecified chronic kidney disease: Secondary | ICD-10-CM | POA: Diagnosis not present

## 2024-01-10 DIAGNOSIS — I69351 Hemiplegia and hemiparesis following cerebral infarction affecting right dominant side: Secondary | ICD-10-CM | POA: Diagnosis not present

## 2024-01-10 DIAGNOSIS — I69392 Facial weakness following cerebral infarction: Secondary | ICD-10-CM | POA: Diagnosis not present

## 2024-01-10 DIAGNOSIS — N184 Chronic kidney disease, stage 4 (severe): Secondary | ICD-10-CM | POA: Diagnosis not present

## 2024-01-10 DIAGNOSIS — E1122 Type 2 diabetes mellitus with diabetic chronic kidney disease: Secondary | ICD-10-CM | POA: Diagnosis not present

## 2024-01-10 DIAGNOSIS — I5042 Chronic combined systolic (congestive) and diastolic (congestive) heart failure: Secondary | ICD-10-CM | POA: Diagnosis not present

## 2024-01-14 DIAGNOSIS — N184 Chronic kidney disease, stage 4 (severe): Secondary | ICD-10-CM | POA: Diagnosis not present

## 2024-01-14 DIAGNOSIS — I130 Hypertensive heart and chronic kidney disease with heart failure and stage 1 through stage 4 chronic kidney disease, or unspecified chronic kidney disease: Secondary | ICD-10-CM | POA: Diagnosis not present

## 2024-01-14 DIAGNOSIS — I69351 Hemiplegia and hemiparesis following cerebral infarction affecting right dominant side: Secondary | ICD-10-CM | POA: Diagnosis not present

## 2024-01-14 DIAGNOSIS — I69392 Facial weakness following cerebral infarction: Secondary | ICD-10-CM | POA: Diagnosis not present

## 2024-01-14 DIAGNOSIS — I5042 Chronic combined systolic (congestive) and diastolic (congestive) heart failure: Secondary | ICD-10-CM | POA: Diagnosis not present

## 2024-01-14 DIAGNOSIS — E1122 Type 2 diabetes mellitus with diabetic chronic kidney disease: Secondary | ICD-10-CM | POA: Diagnosis not present

## 2024-01-16 DIAGNOSIS — R21 Rash and other nonspecific skin eruption: Secondary | ICD-10-CM | POA: Diagnosis not present

## 2024-01-16 DIAGNOSIS — M533 Sacrococcygeal disorders, not elsewhere classified: Secondary | ICD-10-CM | POA: Diagnosis not present

## 2024-01-16 DIAGNOSIS — N39498 Other specified urinary incontinence: Secondary | ICD-10-CM | POA: Diagnosis not present

## 2024-01-18 DIAGNOSIS — I69392 Facial weakness following cerebral infarction: Secondary | ICD-10-CM | POA: Diagnosis not present

## 2024-01-18 DIAGNOSIS — E1122 Type 2 diabetes mellitus with diabetic chronic kidney disease: Secondary | ICD-10-CM | POA: Diagnosis not present

## 2024-01-18 DIAGNOSIS — I130 Hypertensive heart and chronic kidney disease with heart failure and stage 1 through stage 4 chronic kidney disease, or unspecified chronic kidney disease: Secondary | ICD-10-CM | POA: Diagnosis not present

## 2024-01-18 DIAGNOSIS — N184 Chronic kidney disease, stage 4 (severe): Secondary | ICD-10-CM | POA: Diagnosis not present

## 2024-01-18 DIAGNOSIS — I5042 Chronic combined systolic (congestive) and diastolic (congestive) heart failure: Secondary | ICD-10-CM | POA: Diagnosis not present

## 2024-01-18 DIAGNOSIS — I69351 Hemiplegia and hemiparesis following cerebral infarction affecting right dominant side: Secondary | ICD-10-CM | POA: Diagnosis not present

## 2024-01-21 DIAGNOSIS — N184 Chronic kidney disease, stage 4 (severe): Secondary | ICD-10-CM | POA: Diagnosis not present

## 2024-01-21 DIAGNOSIS — I69392 Facial weakness following cerebral infarction: Secondary | ICD-10-CM | POA: Diagnosis not present

## 2024-01-21 DIAGNOSIS — I130 Hypertensive heart and chronic kidney disease with heart failure and stage 1 through stage 4 chronic kidney disease, or unspecified chronic kidney disease: Secondary | ICD-10-CM | POA: Diagnosis not present

## 2024-01-21 DIAGNOSIS — E1122 Type 2 diabetes mellitus with diabetic chronic kidney disease: Secondary | ICD-10-CM | POA: Diagnosis not present

## 2024-01-21 DIAGNOSIS — I5042 Chronic combined systolic (congestive) and diastolic (congestive) heart failure: Secondary | ICD-10-CM | POA: Diagnosis not present

## 2024-01-21 DIAGNOSIS — I69351 Hemiplegia and hemiparesis following cerebral infarction affecting right dominant side: Secondary | ICD-10-CM | POA: Diagnosis not present

## 2024-01-22 DIAGNOSIS — E1122 Type 2 diabetes mellitus with diabetic chronic kidney disease: Secondary | ICD-10-CM | POA: Diagnosis not present

## 2024-01-22 DIAGNOSIS — I130 Hypertensive heart and chronic kidney disease with heart failure and stage 1 through stage 4 chronic kidney disease, or unspecified chronic kidney disease: Secondary | ICD-10-CM | POA: Diagnosis not present

## 2024-01-22 DIAGNOSIS — I69392 Facial weakness following cerebral infarction: Secondary | ICD-10-CM | POA: Diagnosis not present

## 2024-01-22 DIAGNOSIS — I5042 Chronic combined systolic (congestive) and diastolic (congestive) heart failure: Secondary | ICD-10-CM | POA: Diagnosis not present

## 2024-01-22 DIAGNOSIS — N184 Chronic kidney disease, stage 4 (severe): Secondary | ICD-10-CM | POA: Diagnosis not present

## 2024-01-22 DIAGNOSIS — I69351 Hemiplegia and hemiparesis following cerebral infarction affecting right dominant side: Secondary | ICD-10-CM | POA: Diagnosis not present

## 2024-01-23 DIAGNOSIS — E7800 Pure hypercholesterolemia, unspecified: Secondary | ICD-10-CM | POA: Diagnosis not present

## 2024-01-23 DIAGNOSIS — E1142 Type 2 diabetes mellitus with diabetic polyneuropathy: Secondary | ICD-10-CM | POA: Diagnosis not present

## 2024-01-23 DIAGNOSIS — I69352 Hemiplegia and hemiparesis following cerebral infarction affecting left dominant side: Secondary | ICD-10-CM | POA: Diagnosis not present

## 2024-01-23 DIAGNOSIS — I5042 Chronic combined systolic (congestive) and diastolic (congestive) heart failure: Secondary | ICD-10-CM | POA: Diagnosis not present

## 2024-01-23 DIAGNOSIS — Z0000 Encounter for general adult medical examination without abnormal findings: Secondary | ICD-10-CM | POA: Diagnosis not present

## 2024-01-23 DIAGNOSIS — N184 Chronic kidney disease, stage 4 (severe): Secondary | ICD-10-CM | POA: Diagnosis not present

## 2024-01-23 DIAGNOSIS — E1169 Type 2 diabetes mellitus with other specified complication: Secondary | ICD-10-CM | POA: Diagnosis not present

## 2024-01-23 DIAGNOSIS — E1122 Type 2 diabetes mellitus with diabetic chronic kidney disease: Secondary | ICD-10-CM | POA: Diagnosis not present

## 2024-01-23 DIAGNOSIS — I5022 Chronic systolic (congestive) heart failure: Secondary | ICD-10-CM | POA: Diagnosis not present

## 2024-01-25 DIAGNOSIS — I69351 Hemiplegia and hemiparesis following cerebral infarction affecting right dominant side: Secondary | ICD-10-CM | POA: Diagnosis not present

## 2024-01-25 DIAGNOSIS — N184 Chronic kidney disease, stage 4 (severe): Secondary | ICD-10-CM | POA: Diagnosis not present

## 2024-01-25 DIAGNOSIS — I130 Hypertensive heart and chronic kidney disease with heart failure and stage 1 through stage 4 chronic kidney disease, or unspecified chronic kidney disease: Secondary | ICD-10-CM | POA: Diagnosis not present

## 2024-01-25 DIAGNOSIS — I5042 Chronic combined systolic (congestive) and diastolic (congestive) heart failure: Secondary | ICD-10-CM | POA: Diagnosis not present

## 2024-01-25 DIAGNOSIS — E1122 Type 2 diabetes mellitus with diabetic chronic kidney disease: Secondary | ICD-10-CM | POA: Diagnosis not present

## 2024-01-25 DIAGNOSIS — I69392 Facial weakness following cerebral infarction: Secondary | ICD-10-CM | POA: Diagnosis not present

## 2024-01-26 DIAGNOSIS — I428 Other cardiomyopathies: Secondary | ICD-10-CM | POA: Diagnosis not present

## 2024-01-26 DIAGNOSIS — I5042 Chronic combined systolic (congestive) and diastolic (congestive) heart failure: Secondary | ICD-10-CM | POA: Diagnosis not present

## 2024-01-26 DIAGNOSIS — E669 Obesity, unspecified: Secondary | ICD-10-CM | POA: Diagnosis not present

## 2024-01-26 DIAGNOSIS — I2510 Atherosclerotic heart disease of native coronary artery without angina pectoris: Secondary | ICD-10-CM | POA: Diagnosis not present

## 2024-01-26 DIAGNOSIS — E1122 Type 2 diabetes mellitus with diabetic chronic kidney disease: Secondary | ICD-10-CM | POA: Diagnosis not present

## 2024-01-26 DIAGNOSIS — I130 Hypertensive heart and chronic kidney disease with heart failure and stage 1 through stage 4 chronic kidney disease, or unspecified chronic kidney disease: Secondary | ICD-10-CM | POA: Diagnosis not present

## 2024-01-26 DIAGNOSIS — M109 Gout, unspecified: Secondary | ICD-10-CM | POA: Diagnosis not present

## 2024-01-26 DIAGNOSIS — E1151 Type 2 diabetes mellitus with diabetic peripheral angiopathy without gangrene: Secondary | ICD-10-CM | POA: Diagnosis not present

## 2024-01-26 DIAGNOSIS — N390 Urinary tract infection, site not specified: Secondary | ICD-10-CM | POA: Diagnosis not present

## 2024-01-26 DIAGNOSIS — I471 Supraventricular tachycardia, unspecified: Secondary | ICD-10-CM | POA: Diagnosis not present

## 2024-01-26 DIAGNOSIS — E1142 Type 2 diabetes mellitus with diabetic polyneuropathy: Secondary | ICD-10-CM | POA: Diagnosis not present

## 2024-01-26 DIAGNOSIS — N39498 Other specified urinary incontinence: Secondary | ICD-10-CM | POA: Diagnosis not present

## 2024-01-26 DIAGNOSIS — I453 Trifascicular block: Secondary | ICD-10-CM | POA: Diagnosis not present

## 2024-01-26 DIAGNOSIS — I272 Pulmonary hypertension, unspecified: Secondary | ICD-10-CM | POA: Diagnosis not present

## 2024-01-26 DIAGNOSIS — K5900 Constipation, unspecified: Secondary | ICD-10-CM | POA: Diagnosis not present

## 2024-01-26 DIAGNOSIS — I69351 Hemiplegia and hemiparesis following cerebral infarction affecting right dominant side: Secondary | ICD-10-CM | POA: Diagnosis not present

## 2024-01-26 DIAGNOSIS — K219 Gastro-esophageal reflux disease without esophagitis: Secondary | ICD-10-CM | POA: Diagnosis not present

## 2024-01-26 DIAGNOSIS — R338 Other retention of urine: Secondary | ICD-10-CM | POA: Diagnosis not present

## 2024-01-26 DIAGNOSIS — G4733 Obstructive sleep apnea (adult) (pediatric): Secondary | ICD-10-CM | POA: Diagnosis not present

## 2024-01-26 DIAGNOSIS — B961 Klebsiella pneumoniae [K. pneumoniae] as the cause of diseases classified elsewhere: Secondary | ICD-10-CM | POA: Diagnosis not present

## 2024-01-26 DIAGNOSIS — R1312 Dysphagia, oropharyngeal phase: Secondary | ICD-10-CM | POA: Diagnosis not present

## 2024-01-26 DIAGNOSIS — N184 Chronic kidney disease, stage 4 (severe): Secondary | ICD-10-CM | POA: Diagnosis not present

## 2024-01-26 DIAGNOSIS — N401 Enlarged prostate with lower urinary tract symptoms: Secondary | ICD-10-CM | POA: Diagnosis not present

## 2024-01-26 DIAGNOSIS — I69392 Facial weakness following cerebral infarction: Secondary | ICD-10-CM | POA: Diagnosis not present

## 2024-01-26 DIAGNOSIS — E785 Hyperlipidemia, unspecified: Secondary | ICD-10-CM | POA: Diagnosis not present

## 2024-01-28 DIAGNOSIS — H6123 Impacted cerumen, bilateral: Secondary | ICD-10-CM | POA: Diagnosis not present

## 2024-01-29 ENCOUNTER — Telehealth (HOSPITAL_COMMUNITY): Payer: Self-pay | Admitting: Cardiology

## 2024-01-29 NOTE — Telephone Encounter (Signed)
 Called to confirm/remind patient of their appointment at the [facility] on 01/29/24 .   Appointment:   [] Confirmed  [x] Left mess   [] No answer/No voice mail  [] VM Full/unable to leave message  [] Phone not in service  Patient reminded to bring all medications and/or complete list.  Confirmed patient has transportation. Gave directions, instructed to utilize valet parking.

## 2024-01-30 ENCOUNTER — Encounter (HOSPITAL_COMMUNITY): Payer: Self-pay | Admitting: Cardiology

## 2024-01-30 ENCOUNTER — Ambulatory Visit (HOSPITAL_COMMUNITY): Payer: Self-pay | Admitting: Cardiology

## 2024-01-30 ENCOUNTER — Ambulatory Visit (HOSPITAL_COMMUNITY)
Admission: RE | Admit: 2024-01-30 | Discharge: 2024-01-30 | Disposition: A | Discharge status: Home / Self Care | Source: Ambulatory Visit | Attending: Cardiology | Admitting: Cardiology

## 2024-01-30 VITALS — BP 112/64 | HR 63 | Wt 199.8 lb

## 2024-01-30 DIAGNOSIS — N184 Chronic kidney disease, stage 4 (severe): Secondary | ICD-10-CM | POA: Diagnosis not present

## 2024-01-30 DIAGNOSIS — Z7984 Long term (current) use of oral hypoglycemic drugs: Secondary | ICD-10-CM | POA: Insufficient documentation

## 2024-01-30 DIAGNOSIS — I2510 Atherosclerotic heart disease of native coronary artery without angina pectoris: Secondary | ICD-10-CM | POA: Diagnosis not present

## 2024-01-30 DIAGNOSIS — D472 Monoclonal gammopathy: Secondary | ICD-10-CM | POA: Diagnosis not present

## 2024-01-30 DIAGNOSIS — I272 Pulmonary hypertension, unspecified: Secondary | ICD-10-CM | POA: Diagnosis not present

## 2024-01-30 DIAGNOSIS — Z8546 Personal history of malignant neoplasm of prostate: Secondary | ICD-10-CM | POA: Insufficient documentation

## 2024-01-30 DIAGNOSIS — I5020 Unspecified systolic (congestive) heart failure: Secondary | ICD-10-CM | POA: Diagnosis present

## 2024-01-30 DIAGNOSIS — E785 Hyperlipidemia, unspecified: Secondary | ICD-10-CM | POA: Insufficient documentation

## 2024-01-30 DIAGNOSIS — G4733 Obstructive sleep apnea (adult) (pediatric): Secondary | ICD-10-CM | POA: Diagnosis not present

## 2024-01-30 DIAGNOSIS — I5022 Chronic systolic (congestive) heart failure: Secondary | ICD-10-CM | POA: Insufficient documentation

## 2024-01-30 DIAGNOSIS — Z7902 Long term (current) use of antithrombotics/antiplatelets: Secondary | ICD-10-CM | POA: Diagnosis not present

## 2024-01-30 DIAGNOSIS — I5042 Chronic combined systolic (congestive) and diastolic (congestive) heart failure: Secondary | ICD-10-CM | POA: Diagnosis not present

## 2024-01-30 DIAGNOSIS — I69392 Facial weakness following cerebral infarction: Secondary | ICD-10-CM | POA: Diagnosis not present

## 2024-01-30 DIAGNOSIS — Z923 Personal history of irradiation: Secondary | ICD-10-CM | POA: Diagnosis not present

## 2024-01-30 DIAGNOSIS — I69354 Hemiplegia and hemiparesis following cerebral infarction affecting left non-dominant side: Secondary | ICD-10-CM | POA: Insufficient documentation

## 2024-01-30 DIAGNOSIS — I4510 Unspecified right bundle-branch block: Secondary | ICD-10-CM | POA: Insufficient documentation

## 2024-01-30 DIAGNOSIS — Z7982 Long term (current) use of aspirin: Secondary | ICD-10-CM | POA: Insufficient documentation

## 2024-01-30 DIAGNOSIS — E1122 Type 2 diabetes mellitus with diabetic chronic kidney disease: Secondary | ICD-10-CM | POA: Diagnosis not present

## 2024-01-30 DIAGNOSIS — I130 Hypertensive heart and chronic kidney disease with heart failure and stage 1 through stage 4 chronic kidney disease, or unspecified chronic kidney disease: Secondary | ICD-10-CM | POA: Insufficient documentation

## 2024-01-30 DIAGNOSIS — I428 Other cardiomyopathies: Secondary | ICD-10-CM | POA: Diagnosis not present

## 2024-01-30 DIAGNOSIS — I69351 Hemiplegia and hemiparesis following cerebral infarction affecting right dominant side: Secondary | ICD-10-CM | POA: Diagnosis not present

## 2024-01-30 LAB — BASIC METABOLIC PANEL WITH GFR
Anion gap: 9 (ref 5–15)
BUN: 19 mg/dL (ref 8–23)
CO2: 29 mmol/L (ref 22–32)
Calcium: 9.2 mg/dL (ref 8.9–10.3)
Chloride: 102 mmol/L (ref 98–111)
Creatinine, Ser: 1.96 mg/dL — ABNORMAL HIGH (ref 0.61–1.24)
GFR, Estimated: 33 mL/min — ABNORMAL LOW (ref >60)
Glucose, Bld: 153 mg/dL — ABNORMAL HIGH (ref 70–99)
Potassium: 4.2 mmol/L (ref 3.5–5.1)
Sodium: 140 mmol/L (ref 135–145)

## 2024-01-30 LAB — BRAIN NATRIURETIC PEPTIDE: B Natriuretic Peptide: 670.1 pg/mL — ABNORMAL HIGH (ref 0.0–100.0)

## 2024-01-30 MED ORDER — TORSEMIDE 20 MG PO TABS: 60.0000 mg | ORAL_TABLET | Freq: Every day | ORAL | 3 refills | Status: DC

## 2024-01-30 MED ORDER — HYDRALAZINE HCL 10 MG PO TABS: 10.0000 mg | ORAL_TABLET | Freq: Three times a day (TID) | ORAL | 3 refills | Status: DC

## 2024-01-30 NOTE — Patient Instructions (Addendum)
 Good to see you today! INCREASE torsemide   to 60 mg (3 tablets) daily  START Hydralazine  10 MG  Three times a day  Labs done today, your results will be available in MyChart, we will contact you for abnormal readings.   Repeat lab work as scheduled  Your physician recommends that you schedule a follow-up appointment  as scheduled  If you have any questions or concerns before your next appointment please send us  a message through [place] or call our office at [phone].    TO LEAVE A MESSAGE FOR THE NURSE SELECT OPTION 2, PLEASE LEAVE A MESSAGE INCLUDING: YOUR NAME DATE OF BIRTH CALL BACK NUMBER REASON FOR CALL**this is important as we prioritize the call backs  YOU WILL RECEIVE A CALL BACK THE SAME DAY AS LONG AS YOU CALL BEFORE 4:00 PM At the [facility], you and your health needs are our priority. As part of our continuing mission to provide you with exceptional heart care, we have created designated Provider Care Teams. These Care Teams include your primary Cardiologist (physician) and Advanced Practice Providers (APPs- Physician Assistants and Nurse Practitioners) who all work together to provide you with the care you need, when you need it.   You may see any of the following providers on your designated Care Team at your next follow up: Dr Toribio Fuel Dr Ezra Shuck Dr. Ria Commander Dr. Morene Brownie Amy Lenetta, NP Caffie Shed, [place] [facility] [place], [place] Beckey Coe, NP [place] Lee, NP Ellouise Class, NP Tinnie Redman, PharmD Jaun Bash, PharmD   Please be sure to bring in all your medications bottles to every appointment.    Thank you for choosing [facility]

## 2024-01-30 NOTE — Progress Notes (Signed)
 [facility] PCP:  Regino Slater, MD  Primary Cardiologist:  Wilbert Bihari, MD Nephrology: Dr. Dennise SERUM HF Cardiologist: Dr Rolan   HPI: Allen Dyer is a 88 y.o. male with a history of DM, rheumatic fever, HTN, hyperlipidemia, CKD IV, prostate CA s/p XRT, systolic HF due to NICM (EF 30-35%) 05/2018, pulmonary venous HTN, and mild nonobstructive CAD.  He served for > 20 years in the [employer] including in [place].    Admitted 3/11-3/18/20 with volume overload. Diuresed with IV lasix . HF team consulted with concern for pulmonary HTN due to ongoing oxygen need. RHC completed and showed moderate pulmonary venous HTN with normal PVR 1.7 (no PAH).   Patient was diagnosed with smoldering myeloma in 7/20, no treatment planned at this time.   Cardiolite in 2/21 showed EF 36%, fixed anteroapical defect, no ischemia.  Echo in 11/21 showed EF 30-35%, diffuse hypokinesis, normal RV, PASP 45 mmHg, dilated IVC.   Seen in ED 08/05/21 with blurred vision. Felt to be lightheadedness vs vertigo. Orthostatics negative. He had PCP follow up and was diagnosed with vertigo.  CVA in 5/23, echo showed EF 35-40%, mild LVH, normal RV.  Carotid dopplers showed minimal disease.  MRA head showed severe vertebral artery disease bilaterally.   Admitted 5/25 with CVA. MRI showed subacute stroke. CT angiogram showed significant vertebral artery stenosis. Echo showed EF 25-30%, G1DD, normal RV. He was discharged to CIR. Re-admitted 6/25 with new CVA, as well as subacute strokes. Neuro recommending DAPT indefinitely, cardiac monitor placed at discharge.  Zio 7/25 showed most NSR, 5% PVC burden. Seen in ED 8/25 for a fall, CT head negative for bleed.  Today he returns for HF follow up with his daughter. He continues to have left-sided weakness post-CVA.  He generally uses a wheelchair but uses a walker for short distances. No dyspnea with transfers, no orthopnea/PND.  No chest pain. No  lightheadedness. Weight is up. No using CPAP.   ECG (personally reviewed): NSR, 1st degree AVB, LAFB, RBBB.   Labs (6/25): K 3.5, creatinine 1.65, LDL 73 Labs (9/25): K 3.8, creatinine 1.93, hgb 9.4, BNP 860  PMH: 1. CKD: Stage 3.  2. Hyperlipidemia: Statin intolerant.  3. HTN 4. Smoldering myeloma: Diagnosed in 7/20.  - Bone marrow biopsy stained negative for amyloidosis.  5. Prostate cancer s/p XRT.  6. Type 2 diabetes.  7. Chronic systolic CHF: Nonischemic cardiomyopathy.  - RHC/LHC (2/20): Nonobstructive CAD with 50% pLAD; mean RA 13, PA 72/27 mean 47, mean PCWP 29, CI 2.8, PVR 3 WU - Echo (2/20): EF 30-35%.  - RHC (3/20): mean RA 9, PA 55/20 mean 32, mean PCWP 20, CI 3.29, PVR 1.7 WU - Echo (7/20): EF 25-30%, moderate LV dilation without LVH, normal RV size and systolic function, mild-moderate AI.  - Cardiolite (2/21): EF 36%, fixed anteroapical defect, no ischemia.  - Echo (11/21): EF 30-35%, diffuse hypokinesis, normal RV, PASP 45 mmHg, dilated IVC.  - Echo (3/23): EF 30-35%, moderately decreased LV with global HK, grade I DD, normal RV, ascending aorta dilation 44 mm. - Echo (5/23): EF 35-40%, mild LVH, normal RV - Echo (5/25): EF 25-30%, normal RV. 8. Pulmonary venous hypertension.  - V/Q scan (3/20): No evidence for chronic PE.  9. OSA: Using CPAP.  10. ABIs normal in 1/23 11. CVA: 5/23, 5/25, 6/25.  - Carotid dopplers (5/23): minimal disease.  - MRA head (5/23): no flow right vertebral, severe stenosis left vertebral.  - Carotid dopplers (  6/25): Minimal disease in the extracranial carotids.  - MRA head (6/25): Known bilateral vertebral artery occlusions. Unchanged advanced anterior circulation atherosclerosis including severe right and moderate left ICA stenoses.  - Zio 7/25: mostly NSR, 5% PVC burden  Past Surgical History:  Procedure Laterality Date   RIGHT HEART CATH N/A 07/10/2018   Procedure: RIGHT HEART CATH;  Surgeon: [name], MD;  Location: [facility]  INVASIVE CV LAB;  Service: Cardiovascular;  Laterality: N/A;   RIGHT/LEFT HEART CATH AND CORONARY ANGIOGRAPHY N/A 06/18/2018   Procedure: RIGHT/LEFT HEART CATH AND CORONARY ANGIOGRAPHY;  Surgeon: Burnard Debby LABOR, MD;  Location: [facility] INVASIVE CV LAB;  Service: Cardiovascular;  Laterality: N/A;   Current Outpatient Medications  Medication Sig Dispense Refill   acetaminophen  (TYLENOL ) 325 MG tablet Take 2 tablets (650 mg total) by mouth every 4 (four) hours as needed for mild pain (pain score 1-3) (or temp > 37.5 C (99.5 F)).     Ascorbic Acid (VITAMIN C) 1000 MG tablet Take 4,000 mg by mouth every morning.     aspirin  81 MG chewable tablet Chew 81 mg by mouth daily.     Blood Glucose Monitoring Suppl (FREESTYLE LITE) w/Device KIT 1 Device by Does not apply route daily in the afternoon. 1 kit 0   carvedilol  (COREG ) 6.25 MG tablet Take 1 tablet (6.25 mg total) by mouth 2 (two) times daily with a meal. 60 tablet 5   clopidogrel  (PLAVIX ) 75 MG tablet Take 1 tablet (75 mg total) by mouth daily. 30 tablet 0   cyanocobalamin  1000 MCG tablet Take 1 tablet (1,000 mcg total) by mouth every morning. 30 tablet 0   ezetimibe  (ZETIA ) 10 MG tablet Take 1 tablet (10 mg total) by mouth daily. 90 tablet 3   Garlic 1000 MG CAPS Take 1,000 mg by mouth every morning.     glipiZIDE  (GLUCOTROL ) 5 MG tablet Take by mouth 2 (two) times daily before a meal.     glucose blood (FREESTYLE LITE) test strip 1 each by Other route daily in the afternoon. Use as instructed 100 each 3   hydrALAZINE  (APRESOLINE ) 10 MG tablet Take 1 tablet (10 mg total) by mouth 3 (three) times daily. 90 tablet 3   isosorbide  mononitrate (IMDUR ) 30 MG 24 hr tablet Take 0.5 tablets (15 mg total) by mouth every morning.     Omega-3 Fatty Acids (FISH OIL) 1200 MG CAPS Take 1,200 mg by mouth daily.     potassium chloride  SA (KLOR-CON  M) 20 MEQ tablet Take 2 tablets (40 mEq total) by mouth daily. 30 tablet 0   scopolamine  (TRANSDERM-SCOP) 1 MG/3DAYS Place 1  patch (1.5 mg total) onto the skin every 3 (three) days. 30 patch 12   tamsulosin  (FLOMAX ) 0.4 MG CAPS capsule Take 1 capsule (0.4 mg total) by mouth 2 (two) times daily. 60 capsule 0   torsemide  (DEMADEX ) 20 MG tablet Take 3 tablets (60 mg total) by mouth daily. Take 40mg  (2) tablets once daily. May also take an additional tablet 20mg  (1) tablet daily if needed 90 tablet 3   No current facility-administered medications for this encounter.    Allergies:   Tramadol, Finasteride, Jardiance  [empagliflozin ], Lisinopril, Ciprofloxacin, Simvastatin, and Sulfa antibiotics   Social History:  The patient  reports that he quit smoking about 52 years ago. His smoking use included cigarettes. He started smoking about 62 years ago. He has a 20 pack-year smoking history. He has never used smokeless tobacco. He reports that he does not currently use  alcohol. He reports that he does not use drugs.   Family History:  The patient's family history includes Cancer in his mother and sister; Congestive Heart Failure in his brother and father.   ROS:  Please see the history of present illness.   All other systems are personally reviewed and negative.   Wt Readings from Last 3 Encounters:  01/30/24 90.6 kg (199 lb 12.8 oz)  01/04/24 87.8 kg (193 lb 9.6 oz)  12/13/23 89.7 kg (197 lb 12.8 oz)   BP 112/64   Pulse 63   Wt 90.6 kg (199 lb 12.8 oz)   SpO2 95%   BMI 30.83 kg/m   Physical Exam General: NAD Neck: JVP 8 cm with HJR, no thyromegaly or thyroid  nodule.  Lungs: Mild crackles at bases.  CV: Nondisplaced PMI.  Heart regular S1/S2, no S3/S4, no murmur.  2+ edema to knees.  No carotid bruit.  Difficult to palpate pedal pulses.  Abdomen: Soft, nontender, no hepatosplenomegaly, no distention.  Skin: Intact without lesions or rashes.  Neurologic: Alert and oriented x 3.  Psych: Normal affect. Extremities: No clubbing or cyanosis.  HEENT: Normal.   Assessment & Plan: 1. Chronic systolic HF:  New diagnosis  05/2018, EF 30-35% by echo. Due to NICM. LHC 05/2018 with mild non-osbtructive CAD (50% proximal LAD stenosis). Prior hx of ETOH abuse, but none in 40+ years. Did not receive chemotherapy with prior prostate cancer. QRS has been wide on EKG, but it is RBBB.  He has smoldering myeloma but he does not have ventricular wall thickening that would be suggestive of cardiac amyloidosis.  RHC 3/20 with mildly elevated filling pressures, CO preserved, pulmonary venous HTN. No PAH.  Echo in 7/20 showed that EF remained low at 25-30% with moderate LV dilation, no LVH, and normal RV.  Echo in 11/21 showed EF 30-35%, diffuse hypokinesis, normal RV, PASP 45 mmHg, dilated IVC. Echo (3/23) with EF 30-35%, echo in 5/23 showed EF 35-40%, mild LVH, normal RV.  Echo 5/25 showed EF 25-30%, normal RV. NYHA class II probably, functional status confounded by CVA and deconditioning. Weight is up and he looks volume overloaded on exam.  - Increase torsemide  to 60 mg daily, BMET/BNP today and BMET in 10 days.  - Continue carvedilol  6.25 mg bid.   - Continue Imdur  15 mg daily and will add hydralazine  10 mg tid.  - No MRA/ACE/ARB/ARNI with CKD stage 3 and baseline creatinine near 2.  - Off SGLT2-i with yeast infections/frequent UTIs. - With advanced age and nonischemic etiology of cardiomyopathy, would avoid ICD.  He has RBBB so would not likely benefit from CRT.  2. CKD Stage IV: Last creatinine 1.93. - Follows with Nephrology - No SGLT2i with urinary retention/yeast infections 3. CAD: 50% pLAD on LHC in 2/20. Cardiolite in 2/21 showed no ischemia.  No chest pain.  - Continue Zetia  10 mg daily.  - Goal LDL < 55, will refer to pharmacy clinic for Repatha.  - Continue ASA 81.   4. Severe OSA: Needs CPAP mask refitted post-CVA.  5. Pulmonary hypertension: He had pulmonary venous hypertension based on prior RHC.  6. Smoldering myeloma:  No plans for chemotherapy treatment.  - Sees Dr Lonn.  7. CVA: In 5/23.  He has severe  vertebral artery disease.  New CVA 5/25 to bilateral cerebellar region and 6/25 to R pons. Zio monitor showed no AF/AFL.  - Continue DAPT with ASA/Plavix  indefinitely per Neuro - Continue Zetia . Has not tolerated statins. Will try  to add Repatha.   Follow up with APP in 3 wks.   I spent 31 minutes reviewing records, interviewing/examining patient, and managing orders.   Ezra Shuck, MD  01/30/2024

## 2024-01-31 DIAGNOSIS — I5042 Chronic combined systolic (congestive) and diastolic (congestive) heart failure: Secondary | ICD-10-CM | POA: Diagnosis not present

## 2024-01-31 DIAGNOSIS — I69351 Hemiplegia and hemiparesis following cerebral infarction affecting right dominant side: Secondary | ICD-10-CM | POA: Diagnosis not present

## 2024-01-31 DIAGNOSIS — E1122 Type 2 diabetes mellitus with diabetic chronic kidney disease: Secondary | ICD-10-CM | POA: Diagnosis not present

## 2024-01-31 DIAGNOSIS — N184 Chronic kidney disease, stage 4 (severe): Secondary | ICD-10-CM | POA: Diagnosis not present

## 2024-01-31 DIAGNOSIS — I69392 Facial weakness following cerebral infarction: Secondary | ICD-10-CM | POA: Diagnosis not present

## 2024-01-31 DIAGNOSIS — I130 Hypertensive heart and chronic kidney disease with heart failure and stage 1 through stage 4 chronic kidney disease, or unspecified chronic kidney disease: Secondary | ICD-10-CM | POA: Diagnosis not present

## 2024-02-05 DIAGNOSIS — I130 Hypertensive heart and chronic kidney disease with heart failure and stage 1 through stage 4 chronic kidney disease, or unspecified chronic kidney disease: Secondary | ICD-10-CM | POA: Diagnosis not present

## 2024-02-05 DIAGNOSIS — E1122 Type 2 diabetes mellitus with diabetic chronic kidney disease: Secondary | ICD-10-CM | POA: Diagnosis not present

## 2024-02-05 DIAGNOSIS — I69351 Hemiplegia and hemiparesis following cerebral infarction affecting right dominant side: Secondary | ICD-10-CM | POA: Diagnosis not present

## 2024-02-05 DIAGNOSIS — I5042 Chronic combined systolic (congestive) and diastolic (congestive) heart failure: Secondary | ICD-10-CM | POA: Diagnosis not present

## 2024-02-05 DIAGNOSIS — I69392 Facial weakness following cerebral infarction: Secondary | ICD-10-CM | POA: Diagnosis not present

## 2024-02-05 DIAGNOSIS — N184 Chronic kidney disease, stage 4 (severe): Secondary | ICD-10-CM | POA: Diagnosis not present

## 2024-02-07 DIAGNOSIS — E1122 Type 2 diabetes mellitus with diabetic chronic kidney disease: Secondary | ICD-10-CM | POA: Diagnosis not present

## 2024-02-07 DIAGNOSIS — I69351 Hemiplegia and hemiparesis following cerebral infarction affecting right dominant side: Secondary | ICD-10-CM | POA: Diagnosis not present

## 2024-02-07 DIAGNOSIS — I5042 Chronic combined systolic (congestive) and diastolic (congestive) heart failure: Secondary | ICD-10-CM | POA: Diagnosis not present

## 2024-02-07 DIAGNOSIS — I69392 Facial weakness following cerebral infarction: Secondary | ICD-10-CM | POA: Diagnosis not present

## 2024-02-07 DIAGNOSIS — N184 Chronic kidney disease, stage 4 (severe): Secondary | ICD-10-CM | POA: Diagnosis not present

## 2024-02-07 DIAGNOSIS — I130 Hypertensive heart and chronic kidney disease with heart failure and stage 1 through stage 4 chronic kidney disease, or unspecified chronic kidney disease: Secondary | ICD-10-CM | POA: Diagnosis not present

## 2024-02-08 ENCOUNTER — Ambulatory Visit (HOSPITAL_COMMUNITY)
Admission: RE | Admit: 2024-02-08 | Discharge: 2024-02-08 | Disposition: A | Source: Ambulatory Visit | Attending: Internal Medicine

## 2024-02-08 DIAGNOSIS — I5042 Chronic combined systolic (congestive) and diastolic (congestive) heart failure: Secondary | ICD-10-CM | POA: Insufficient documentation

## 2024-02-08 LAB — BASIC METABOLIC PANEL WITH GFR
Anion gap: 11 (ref 5–15)
BUN: 15 mg/dL (ref 8–23)
CO2: 30 mmol/L (ref 22–32)
Calcium: 9.3 mg/dL (ref 8.9–10.3)
Chloride: 101 mmol/L (ref 98–111)
Creatinine, Ser: 2.01 mg/dL — ABNORMAL HIGH (ref 0.61–1.24)
GFR, Estimated: 32 mL/min — ABNORMAL LOW (ref >60)
Glucose, Bld: 156 mg/dL — ABNORMAL HIGH (ref 70–99)
Potassium: 3.5 mmol/L (ref 3.5–5.1)
Sodium: 142 mmol/L (ref 135–145)

## 2024-02-09 ENCOUNTER — Emergency Department (HOSPITAL_COMMUNITY)

## 2024-02-09 ENCOUNTER — Emergency Department (HOSPITAL_COMMUNITY)
Admission: EM | Admit: 2024-02-09 | Discharge: 2024-02-09 | Disposition: A | Discharge status: Home / Self Care | Attending: Emergency Medicine | Admitting: Emergency Medicine

## 2024-02-09 DIAGNOSIS — Z7982 Long term (current) use of aspirin: Secondary | ICD-10-CM | POA: Insufficient documentation

## 2024-02-09 DIAGNOSIS — Z7901 Long term (current) use of anticoagulants: Secondary | ICD-10-CM | POA: Diagnosis not present

## 2024-02-09 DIAGNOSIS — I639 Cerebral infarction, unspecified: Secondary | ICD-10-CM | POA: Diagnosis not present

## 2024-02-09 DIAGNOSIS — R29818 Other symptoms and signs involving the nervous system: Secondary | ICD-10-CM | POA: Diagnosis not present

## 2024-02-09 DIAGNOSIS — Z8673 Personal history of transient ischemic attack (TIA), and cerebral infarction without residual deficits: Secondary | ICD-10-CM | POA: Diagnosis not present

## 2024-02-09 DIAGNOSIS — R4789 Other speech disturbances: Secondary | ICD-10-CM | POA: Diagnosis not present

## 2024-02-09 DIAGNOSIS — I10 Essential (primary) hypertension: Secondary | ICD-10-CM | POA: Diagnosis not present

## 2024-02-09 DIAGNOSIS — R202 Paresthesia of skin: Secondary | ICD-10-CM | POA: Insufficient documentation

## 2024-02-09 DIAGNOSIS — R9089 Other abnormal findings on diagnostic imaging of central nervous system: Secondary | ICD-10-CM | POA: Diagnosis not present

## 2024-02-09 LAB — BASIC METABOLIC PANEL WITH GFR
Anion gap: 9 (ref 5–15)
BUN: 16 mg/dL (ref 8–23)
CO2: 34 mmol/L — ABNORMAL HIGH (ref 22–32)
Calcium: 10.2 mg/dL (ref 8.9–10.3)
Chloride: 102 mmol/L (ref 98–111)
Creatinine, Ser: 1.83 mg/dL — ABNORMAL HIGH (ref 0.61–1.24)
GFR, Estimated: 35 mL/min — ABNORMAL LOW (ref >60)
Glucose, Bld: 88 mg/dL (ref 70–99)
Potassium: 4 mmol/L (ref 3.5–5.1)
Sodium: 145 mmol/L (ref 135–145)

## 2024-02-09 LAB — CBC
HCT: 33.2 % — ABNORMAL LOW (ref 39.0–52.0)
Hemoglobin: 10.1 g/dL — ABNORMAL LOW (ref 13.0–17.0)
MCH: 31.2 pg (ref 26.0–34.0)
MCHC: 30.4 g/dL (ref 30.0–36.0)
MCV: 102.5 fL — ABNORMAL HIGH (ref 80.0–100.0)
Platelets: 165 K/uL (ref 150–400)
RBC: 3.24 MIL/uL — ABNORMAL LOW (ref 4.22–5.81)
RDW: 13.6 % (ref 11.5–15.5)
WBC: 4.7 K/uL (ref 4.0–10.5)
nRBC: 0 % (ref 0.0–0.2)

## 2024-02-09 NOTE — ED Triage Notes (Signed)
 Patient in today with his daughter reporting ongoing mini strokes. Reports feeling as if right side of face is swelling. No droop noted for slurred speech.

## 2024-02-09 NOTE — Discharge Instructions (Addendum)
 Your workup was reassuring.  We do not see a stroke.  Follow-up with your doctors as needed.

## 2024-02-09 NOTE — ED Notes (Signed)
 Pt to MRI prior to ED room arrival

## 2024-02-09 NOTE — ED Provider Notes (Signed)
 [place] EMERGENCY DEPARTMENT AT [facility] Provider Note   CSN: [id number] Arrival date & time: 02/09/24  9068     Patient presents with: Stroke Symptoms   Allen Dyer is a 88 y.o. male.   HPI Patient presents with worry for new stroke.  Has felt as if the right side of the face is swollen.  States some difficulty speaking.  States this has been going 4 days to weeks.  History of strokes and has had difficulty speaking with it.  States his head does feel tight.  No necessarily new numbness or weakness.  Does have some chronic weakness in the left side from previous stroke.  Has also recently been seen over the last couple months for some dental issues on the right side.  States had a tooth extracted previously and told it was healing well.    Prior to Admission medications   Medication Sig Start Date End Date Taking? Authorizing Provider  acetaminophen  (TYLENOL ) 325 MG tablet Take 2 tablets (650 mg total) by mouth every 4 (four) hours as needed for mild pain (pain score 1-3) (or temp > 37.5 C (99.5 F)). 09/05/23   Angiulli, Toribio PARAS, PA-C  Ascorbic Acid (VITAMIN C) 1000 MG tablet Take 4,000 mg by mouth every morning.    [provider]  aspirin  81 MG chewable tablet Chew 81 mg by mouth daily. 11/27/23   [provider]  Blood Glucose Monitoring Suppl (FREESTYLE LITE) w/Device KIT 1 Device by Does not apply route daily in the afternoon. 05/24/21   Shamleffer, Ibtehal Jaralla, MD  carvedilol  (COREG ) 6.25 MG tablet Take 1 tablet (6.25 mg total) by mouth 2 (two) times daily with a meal. 11/29/23   [name], MD  clopidogrel  (PLAVIX ) 75 MG tablet Take 1 tablet (75 mg total) by mouth daily. 09/26/23   Angiulli, Toribio PARAS, PA-C  cyanocobalamin  1000 MCG tablet Take 1 tablet (1,000 mcg total) by mouth every morning. 09/26/23   Angiulli, Toribio PARAS, PA-C  ezetimibe  (ZETIA ) 10 MG tablet Take 1 tablet (10 mg total) by mouth daily. 11/13/23 02/11/24  Glena Harlene HERO,  FNP  Garlic 1000 MG CAPS Take 1,000 mg by mouth every morning.    [provider]  glipiZIDE  (GLUCOTROL ) 5 MG tablet Take by mouth 2 (two) times daily before a meal.    [provider]  glucose blood (FREESTYLE LITE) test strip 1 each by Other route daily in the afternoon. Use as instructed 05/24/21   Shamleffer, Ibtehal Jaralla, MD  hydrALAZINE  (APRESOLINE ) 10 MG tablet Take 1 tablet (10 mg total) by mouth 3 (three) times daily. 01/30/24   [name], MD  isosorbide  mononitrate (IMDUR ) 30 MG 24 hr tablet Take 0.5 tablets (15 mg total) by mouth every morning. 10/09/23   Perri DELENA Meliton Mickey., MD  Omega-3 Fatty Acids (FISH OIL) 1200 MG CAPS Take 1,200 mg by mouth daily.    [provider]  potassium chloride  SA (KLOR-CON  M) 20 MEQ tablet Take 2 tablets (40 mEq total) by mouth daily. 11/13/23   Milford, Harlene HERO, FNP  scopolamine  (TRANSDERM-SCOP) 1 MG/3DAYS Place 1 patch (1.5 mg total) onto the skin every 3 (three) days. 12/07/23   Raulkar, Sven SQUIBB, MD  tamsulosin  (FLOMAX ) 0.4 MG CAPS capsule Take 1 capsule (0.4 mg total) by mouth 2 (two) times daily. 09/26/23   Angiulli, Toribio PARAS, PA-C  torsemide  (DEMADEX ) 20 MG tablet Take 3 tablets (60 mg total) by mouth daily. Take 40mg  (2) tablets  once daily. May also take an additional tablet 20mg  (1) tablet daily if needed 01/30/24   [name], MD    Allergies: Tramadol, Finasteride, Jardiance  [empagliflozin ], Lisinopril, Ciprofloxacin, Simvastatin, and Sulfa antibiotics    Review of Systems  Updated Vital Signs BP (!) 142/81 (BP Location: Left Arm)   Pulse 60   Temp 97.8 F (36.6 C) (Oral)   Resp 16   SpO2 98%   Physical Exam Vitals and nursing note reviewed.  HENT:     Head: Atraumatic.  Cardiovascular:     Rate and Rhythm: Regular rhythm.  Abdominal:     Tenderness: There is no abdominal tenderness.  Skin:    General: Skin is warm.  Neurological:     Mental Status: He is alert and oriented to person,  place, and time.     Comments: Eye movements grossly intact.  No clear neglect.  Does have some mild left-sided facial droop.  Mild paresthesias to right mid face.     (all labs ordered are listed, but only abnormal results are displayed) Labs Reviewed  CBC - Abnormal; Notable for the following components:      Result Value   RBC 3.24 (*)    Hemoglobin 10.1 (*)    HCT 33.2 (*)    MCV 102.5 (*)    All other components within normal limits  BASIC METABOLIC PANEL WITH GFR - Abnormal; Notable for the following components:   CO2 34 (*)    Creatinine, Ser 1.83 (*)    GFR, Estimated 35 (*)    All other components within normal limits    EKG: None  Radiology: MR BRAIN WO CONTRAST Result Date: 02/09/2024 EXAM: MRI BRAIN WITHOUT CONTRAST 02/09/2024 11:34:07 AM TECHNIQUE: Multiplanar multisequence MRI of the head/brain was performed without the administration of intravenous contrast. COMPARISON: CT head 12/17/2023. CLINICAL HISTORY: Neuro deficit, acute, stroke suspected. Neuro deficit, acute, stroke suspected ; Prior 10/06/23. Neuro deficit, acute, stroke suspected. Neuro deficit, acute, stroke suspected ; Prior 10/06/23. FINDINGS: BRAIN AND VENTRICLES: No acute infarct. No intracranial hemorrhage. No mass. No midline shift. No hydrocephalus. Redemonstration of multiple remote cerebellar infarcts. Minimal FLAIR signal abnormality in the supratentorial white matter likely reflecting chronic microvascular ischemic changes. Mild generalized parenchymal volume loss. Susceptibility in the left cerebellum extending into the left middle cerebellar peduncle with associated remote infarcts in this region; findings likely reflect remote hemorrhagic infarct. Abnormal signal within the flow voids of the intracranial vertebral arteries and basilar artery which may reflect severe atherosclerosis; consider CTA for further evaluation. The sella is unremarkable. ORBITS: Bilateral lens replacement. SINUSES AND  MASTOIDS: No acute abnormality. BONES AND SOFT TISSUES: Normal marrow signal. No acute soft tissue abnormality. IMPRESSION: 1. No acute intracranial abnormality. 2. Abnormal signal within the flow voids of the intracranial vertebral and basilar arteries, possibly reflecting severe atherosclerosis. CTA may be helpful for further evaluation. 3. Multiple remote cerebellar infarcts, including a likely remote hemorrhagic infarct in the left cerebellum extending into the left middle cerebellar peduncle. Electronically signed by: Donnice Mania MD 02/09/2024 12:20 PM EDT RP Workstation: [id number]     Procedures   Medications Ordered in the ED - No data to display                                  Medical Decision Making Amount and/or Complexity of Data Reviewed Labs: ordered. Radiology: ordered.   Patient with right sided facial symptoms.  Not a TNK candidate due to symptoms going on for a few days or longer.  Has had previous strokes however.  With some vague symptoms.  Has had multiple strokes.  I think patient needs rule out for stroke.  Blood work done overall reassuring.  MRI done showed no acute stroke.  I think stable for discharge home to follow-up with PCP.  Will discharge.     Final diagnoses:  None    ED Discharge Orders     None          Patsey Lot, MD 02/09/24 1351

## 2024-02-11 DIAGNOSIS — I5042 Chronic combined systolic (congestive) and diastolic (congestive) heart failure: Secondary | ICD-10-CM | POA: Diagnosis not present

## 2024-02-11 DIAGNOSIS — I69351 Hemiplegia and hemiparesis following cerebral infarction affecting right dominant side: Secondary | ICD-10-CM | POA: Diagnosis not present

## 2024-02-11 DIAGNOSIS — N184 Chronic kidney disease, stage 4 (severe): Secondary | ICD-10-CM | POA: Diagnosis not present

## 2024-02-11 DIAGNOSIS — E1122 Type 2 diabetes mellitus with diabetic chronic kidney disease: Secondary | ICD-10-CM | POA: Diagnosis not present

## 2024-02-11 DIAGNOSIS — I130 Hypertensive heart and chronic kidney disease with heart failure and stage 1 through stage 4 chronic kidney disease, or unspecified chronic kidney disease: Secondary | ICD-10-CM | POA: Diagnosis not present

## 2024-02-11 DIAGNOSIS — I69392 Facial weakness following cerebral infarction: Secondary | ICD-10-CM | POA: Diagnosis not present

## 2024-02-12 DIAGNOSIS — I69392 Facial weakness following cerebral infarction: Secondary | ICD-10-CM | POA: Diagnosis not present

## 2024-02-12 DIAGNOSIS — I69351 Hemiplegia and hemiparesis following cerebral infarction affecting right dominant side: Secondary | ICD-10-CM | POA: Diagnosis not present

## 2024-02-12 DIAGNOSIS — I5042 Chronic combined systolic (congestive) and diastolic (congestive) heart failure: Secondary | ICD-10-CM | POA: Diagnosis not present

## 2024-02-12 DIAGNOSIS — N184 Chronic kidney disease, stage 4 (severe): Secondary | ICD-10-CM | POA: Diagnosis not present

## 2024-02-12 DIAGNOSIS — E1122 Type 2 diabetes mellitus with diabetic chronic kidney disease: Secondary | ICD-10-CM | POA: Diagnosis not present

## 2024-02-12 DIAGNOSIS — I130 Hypertensive heart and chronic kidney disease with heart failure and stage 1 through stage 4 chronic kidney disease, or unspecified chronic kidney disease: Secondary | ICD-10-CM | POA: Diagnosis not present

## 2024-02-13 ENCOUNTER — Ambulatory Visit: Admitting: Podiatry

## 2024-02-14 DIAGNOSIS — I69392 Facial weakness following cerebral infarction: Secondary | ICD-10-CM | POA: Diagnosis not present

## 2024-02-14 DIAGNOSIS — E1122 Type 2 diabetes mellitus with diabetic chronic kidney disease: Secondary | ICD-10-CM | POA: Diagnosis not present

## 2024-02-14 DIAGNOSIS — I130 Hypertensive heart and chronic kidney disease with heart failure and stage 1 through stage 4 chronic kidney disease, or unspecified chronic kidney disease: Secondary | ICD-10-CM | POA: Diagnosis not present

## 2024-02-14 DIAGNOSIS — I69351 Hemiplegia and hemiparesis following cerebral infarction affecting right dominant side: Secondary | ICD-10-CM | POA: Diagnosis not present

## 2024-02-14 DIAGNOSIS — N184 Chronic kidney disease, stage 4 (severe): Secondary | ICD-10-CM | POA: Diagnosis not present

## 2024-02-14 DIAGNOSIS — I5042 Chronic combined systolic (congestive) and diastolic (congestive) heart failure: Secondary | ICD-10-CM | POA: Diagnosis not present

## 2024-02-15 NOTE — Progress Notes (Signed)
 [facility] PCP:  Regino Slater, MD  Primary Cardiologist:  Wilbert Bihari, MD Nephrology: Dr. Dennise SERUM HF Cardiologist: Dr Rolan   HPI: Allen Dyer is a 88 y.o. male with a history of DM, rheumatic fever, HTN, hyperlipidemia, CKD IV, prostate CA s/p XRT, systolic HF due to NICM (EF 30-35%) 05/2018, pulmonary venous HTN, and mild nonobstructive CAD.  He served for > 20 years in the [employer] including in [place].    Admitted 3/11-3/18/20 with volume overload. Diuresed with IV lasix . HF team consulted with concern for pulmonary HTN due to ongoing oxygen need. RHC completed and showed moderate pulmonary venous HTN with normal PVR 1.7 (no PAH).   Patient was diagnosed with smoldering myeloma in 7/20, no treatment planned at this time.   Cardiolite in 2/21 showed EF 36%, fixed anteroapical defect, no ischemia.  Echo in 11/21 showed EF 30-35%, diffuse hypokinesis, normal RV, PASP 45 mmHg, dilated IVC.   Seen in ED 08/05/21 with blurred vision. Felt to be lightheadedness vs vertigo. Orthostatics negative. He had PCP follow up and was diagnosed with vertigo.  CVA in 5/23, echo showed EF 35-40%, mild LVH, normal RV.  Carotid dopplers showed minimal disease.  MRA head showed severe vertebral artery disease bilaterally.   Admitted 5/25 with CVA. MRI showed subacute stroke. CT angiogram showed significant vertebral artery stenosis. Echo showed EF 25-30%, G1DD, normal RV. He was discharged to CIR. Re-admitted 6/25 with new CVA, as well as subacute strokes. Neuro recommending DAPT indefinitely, cardiac monitor placed at discharge.  Zio 7/25 showed most NSR, 5% PVC burden. Seen in ED 8/25 for a fall, CT head negative for bleed.  Today he returns for HF follow up with his daughter. He continues to have left-sided weakness post-CVA.  He generally uses a wheelchair but uses a walker for short distances. No dyspnea with transfers, no orthopnea/PND.  No chest pain. No  lightheadedness. Weight is up. No using CPAP.   ECG (personally reviewed): NSR, 1st degree AVB, LAFB, RBBB.   Labs (6/25): K 3.5, creatinine 1.65, LDL 73 Labs (9/25): K 3.8, creatinine 1.93, hgb 9.4, BNP 860  PMH: 1. CKD: Stage 3.  2. Hyperlipidemia: Statin intolerant.  3. HTN 4. Smoldering myeloma: Diagnosed in 7/20.  - Bone marrow biopsy stained negative for amyloidosis.  5. Prostate cancer s/p XRT.  6. Type 2 diabetes.  7. Chronic systolic CHF: Nonischemic cardiomyopathy.  - RHC/LHC (2/20): Nonobstructive CAD with 50% pLAD; mean RA 13, PA 72/27 mean 47, mean PCWP 29, CI 2.8, PVR 3 WU - Echo (2/20): EF 30-35%.  - RHC (3/20): mean RA 9, PA 55/20 mean 32, mean PCWP 20, CI 3.29, PVR 1.7 WU - Echo (7/20): EF 25-30%, moderate LV dilation without LVH, normal RV size and systolic function, mild-moderate AI.  - Cardiolite (2/21): EF 36%, fixed anteroapical defect, no ischemia.  - Echo (11/21): EF 30-35%, diffuse hypokinesis, normal RV, PASP 45 mmHg, dilated IVC.  - Echo (3/23): EF 30-35%, moderately decreased LV with global HK, grade I DD, normal RV, ascending aorta dilation 44 mm. - Echo (5/23): EF 35-40%, mild LVH, normal RV - Echo (5/25): EF 25-30%, normal RV. 8. Pulmonary venous hypertension.  - V/Q scan (3/20): No evidence for chronic PE.  9. OSA: Using CPAP.  10. ABIs normal in 1/23 11. CVA: 5/23, 5/25, 6/25.  - Carotid dopplers (5/23): minimal disease.  - MRA head (5/23): no flow right vertebral, severe stenosis left vertebral.  - Carotid dopplers (  6/25): Minimal disease in the extracranial carotids.  - MRA head (6/25): Known bilateral vertebral artery occlusions. Unchanged advanced anterior circulation atherosclerosis including severe right and moderate left ICA stenoses.  - Zio 7/25: mostly NSR, 5% PVC burden  Past Surgical History:  Procedure Laterality Date   RIGHT HEART CATH N/A 07/10/2018   Procedure: RIGHT HEART CATH;  Surgeon: [name], MD;  Location: [facility]  INVASIVE CV LAB;  Service: Cardiovascular;  Laterality: N/A;   RIGHT/LEFT HEART CATH AND CORONARY ANGIOGRAPHY N/A 06/18/2018   Procedure: RIGHT/LEFT HEART CATH AND CORONARY ANGIOGRAPHY;  Surgeon: Burnard Debby LABOR, MD;  Location: [facility] INVASIVE CV LAB;  Service: Cardiovascular;  Laterality: N/A;   Current Outpatient Medications  Medication Sig Dispense Refill   acetaminophen  (TYLENOL ) 325 MG tablet Take 2 tablets (650 mg total) by mouth every 4 (four) hours as needed for mild pain (pain score 1-3) (or temp > 37.5 C (99.5 F)).     Ascorbic Acid (VITAMIN C) 1000 MG tablet Take 4,000 mg by mouth every morning.     aspirin  81 MG chewable tablet Chew 81 mg by mouth daily.     Blood Glucose Monitoring Suppl (FREESTYLE LITE) w/Device KIT 1 Device by Does not apply route daily in the afternoon. 1 kit 0   carvedilol  (COREG ) 6.25 MG tablet Take 1 tablet (6.25 mg total) by mouth 2 (two) times daily with a meal. 60 tablet 5   clopidogrel  (PLAVIX ) 75 MG tablet Take 1 tablet (75 mg total) by mouth daily. 30 tablet 0   cyanocobalamin  1000 MCG tablet Take 1 tablet (1,000 mcg total) by mouth every morning. 30 tablet 0   ezetimibe  (ZETIA ) 10 MG tablet Take 1 tablet (10 mg total) by mouth daily. 90 tablet 3   Garlic 1000 MG CAPS Take 1,000 mg by mouth every morning.     glipiZIDE  (GLUCOTROL ) 5 MG tablet Take by mouth 2 (two) times daily before a meal.     glucose blood (FREESTYLE LITE) test strip 1 each by Other route daily in the afternoon. Use as instructed 100 each 3   hydrALAZINE  (APRESOLINE ) 10 MG tablet Take 1 tablet (10 mg total) by mouth 3 (three) times daily. 90 tablet 3   isosorbide  mononitrate (IMDUR ) 30 MG 24 hr tablet Take 0.5 tablets (15 mg total) by mouth every morning.     Omega-3 Fatty Acids (FISH OIL) 1200 MG CAPS Take 1,200 mg by mouth daily.     potassium chloride  SA (KLOR-CON  M) 20 MEQ tablet Take 2 tablets (40 mEq total) by mouth daily. 30 tablet 0   scopolamine  (TRANSDERM-SCOP) 1 MG/3DAYS Place 1  patch (1.5 mg total) onto the skin every 3 (three) days. 30 patch 12   tamsulosin  (FLOMAX ) 0.4 MG CAPS capsule Take 1 capsule (0.4 mg total) by mouth 2 (two) times daily. 60 capsule 0   torsemide  (DEMADEX ) 20 MG tablet Take 3 tablets (60 mg total) by mouth daily. Take 40mg  (2) tablets once daily. May also take an additional tablet 20mg  (1) tablet daily if needed 90 tablet 3   No current facility-administered medications for this visit.    Allergies:   Tramadol, Finasteride, Jardiance  [empagliflozin ], Lisinopril, Ciprofloxacin, Simvastatin, and Sulfa antibiotics   Social History:  The patient  reports that he quit smoking about 52 years ago. His smoking use included cigarettes. He started smoking about 62 years ago. He has a 20 pack-year smoking history. He has never used smokeless tobacco. He reports that he does not currently use  alcohol. He reports that he does not use drugs.   Family History:  The patient's family history includes Cancer in his mother and sister; Congestive Heart Failure in his brother and father.   ROS:  Please see the history of present illness.   All other systems are personally reviewed and negative.   Wt Readings from Last 3 Encounters:  01/30/24 90.6 kg (199 lb 12.8 oz)  01/04/24 87.8 kg (193 lb 9.6 oz)  12/13/23 89.7 kg (197 lb 12.8 oz)   There were no vitals taken for this visit.  Physical Exam General: NAD Neck: JVP 8 cm with HJR, no thyromegaly or thyroid  nodule.  Lungs: Mild crackles at bases.  CV: Nondisplaced PMI.  Heart regular S1/S2, no S3/S4, no murmur.  2+ edema to knees.  No carotid bruit.  Difficult to palpate pedal pulses.  Abdomen: Soft, nontender, no hepatosplenomegaly, no distention.  Skin: Intact without lesions or rashes.  Neurologic: Alert and oriented x 3.  Psych: Normal affect. Extremities: No clubbing or cyanosis.  HEENT: Normal.   Assessment & Plan: 1. Chronic systolic HF:  New diagnosis 05/2018, EF 30-35% by echo. Due to NICM. LHC  05/2018 with mild non-osbtructive CAD (50% proximal LAD stenosis). Prior hx of ETOH abuse, but none in 40+ years. Did not receive chemotherapy with prior prostate cancer. QRS has been wide on EKG, but it is RBBB.  He has smoldering myeloma but he does not have ventricular wall thickening that would be suggestive of cardiac amyloidosis.  RHC 3/20 with mildly elevated filling pressures, CO preserved, pulmonary venous HTN. No PAH.  Echo in 7/20 showed that EF remained low at 25-30% with moderate LV dilation, no LVH, and normal RV.  Echo in 11/21 showed EF 30-35%, diffuse hypokinesis, normal RV, PASP 45 mmHg, dilated IVC. Echo (3/23) with EF 30-35%, echo in 5/23 showed EF 35-40%, mild LVH, normal RV.  Echo 5/25 showed EF 25-30%, normal RV. NYHA class II probably, functional status confounded by CVA and deconditioning. Weight is up and he looks volume overloaded on exam.  - Increase torsemide  to 60 mg daily, BMET/BNP today and BMET in 10 days.  - Continue carvedilol  6.25 mg bid.   - Continue Imdur  15 mg daily and will add hydralazine  10 mg tid.  - No MRA/ACE/ARB/ARNI with CKD stage 3 and baseline creatinine near 2.  - Off SGLT2-i with yeast infections/frequent UTIs. - With advanced age and nonischemic etiology of cardiomyopathy, would avoid ICD.  He has RBBB so would not likely benefit from CRT.  2. CKD Stage IV: Last creatinine 1.93. - Follows with Nephrology - No SGLT2i with urinary retention/yeast infections 3. CAD: 50% pLAD on LHC in 2/20. Cardiolite in 2/21 showed no ischemia.  No chest pain.  - Continue Zetia  10 mg daily.  - Goal LDL < 55, will refer to pharmacy clinic for Repatha.  - Continue ASA 81.   4. Severe OSA: Needs CPAP mask refitted post-CVA.  5. Pulmonary hypertension: He had pulmonary venous hypertension based on prior RHC.  6. Smoldering myeloma:  No plans for chemotherapy treatment.  - Sees Dr Lonn.  7. CVA: In 5/23.  He has severe vertebral artery disease.  New CVA 5/25 to  bilateral cerebellar region and 6/25 to R pons. Zio monitor showed no AF/AFL.  - Continue DAPT with ASA/Plavix  indefinitely per Neuro - Continue Zetia . Has not tolerated statins. Will try to add Repatha.   Follow up with APP in 3 wks.   I spent 31  minutes reviewing records, interviewing/examining patient, and managing orders.   Harlene HERO Morehead, FNP  02/15/2024

## 2024-02-18 ENCOUNTER — Telehealth (HOSPITAL_COMMUNITY): Payer: Self-pay

## 2024-02-18 NOTE — Telephone Encounter (Signed)
 Called to confirm/remind patient of their appointment at the [facility] on 02/19/24.   Appointment:   [x] Confirmed  [] Left mess   [] No answer/No voice mail  [] VM Full/unable to leave message  [] Phone not in service  Patient reminded to bring all medications and/or complete list.  Confirmed patient has transportation. Gave directions, instructed to utilize valet parking.

## 2024-02-19 ENCOUNTER — Ambulatory Visit (HOSPITAL_COMMUNITY)
Admission: RE | Admit: 2024-02-19 | Discharge: 2024-02-19 | Disposition: A | Discharge status: Home / Self Care | Source: Ambulatory Visit | Attending: Family Medicine | Admitting: Family Medicine

## 2024-02-19 ENCOUNTER — Encounter (HOSPITAL_COMMUNITY): Payer: Self-pay

## 2024-02-19 ENCOUNTER — Encounter (HOSPITAL_COMMUNITY)

## 2024-02-19 VITALS — BP 136/76 | HR 62 | Ht 67.5 in | Wt 196.0 lb

## 2024-02-19 DIAGNOSIS — I779 Disorder of arteries and arterioles, unspecified: Secondary | ICD-10-CM | POA: Insufficient documentation

## 2024-02-19 DIAGNOSIS — I69354 Hemiplegia and hemiparesis following cerebral infarction affecting left non-dominant side: Secondary | ICD-10-CM | POA: Insufficient documentation

## 2024-02-19 DIAGNOSIS — N184 Chronic kidney disease, stage 4 (severe): Secondary | ICD-10-CM | POA: Diagnosis not present

## 2024-02-19 DIAGNOSIS — Z87891 Personal history of nicotine dependence: Secondary | ICD-10-CM | POA: Diagnosis not present

## 2024-02-19 DIAGNOSIS — I2510 Atherosclerotic heart disease of native coronary artery without angina pectoris: Secondary | ICD-10-CM | POA: Diagnosis not present

## 2024-02-19 DIAGNOSIS — E1122 Type 2 diabetes mellitus with diabetic chronic kidney disease: Secondary | ICD-10-CM | POA: Diagnosis not present

## 2024-02-19 DIAGNOSIS — I639 Cerebral infarction, unspecified: Secondary | ICD-10-CM

## 2024-02-19 DIAGNOSIS — Z8546 Personal history of malignant neoplasm of prostate: Secondary | ICD-10-CM | POA: Diagnosis not present

## 2024-02-19 DIAGNOSIS — Z7984 Long term (current) use of oral hypoglycemic drugs: Secondary | ICD-10-CM | POA: Insufficient documentation

## 2024-02-19 DIAGNOSIS — Z993 Dependence on wheelchair: Secondary | ICD-10-CM | POA: Diagnosis not present

## 2024-02-19 DIAGNOSIS — E785 Hyperlipidemia, unspecified: Secondary | ICD-10-CM | POA: Diagnosis not present

## 2024-02-19 DIAGNOSIS — Z7982 Long term (current) use of aspirin: Secondary | ICD-10-CM | POA: Insufficient documentation

## 2024-02-19 DIAGNOSIS — Z923 Personal history of irradiation: Secondary | ICD-10-CM | POA: Diagnosis not present

## 2024-02-19 DIAGNOSIS — I5022 Chronic systolic (congestive) heart failure: Secondary | ICD-10-CM | POA: Insufficient documentation

## 2024-02-19 DIAGNOSIS — Z9185 Personal history of military service: Secondary | ICD-10-CM | POA: Insufficient documentation

## 2024-02-19 DIAGNOSIS — Z87440 Personal history of urinary (tract) infections: Secondary | ICD-10-CM | POA: Diagnosis not present

## 2024-02-19 DIAGNOSIS — Z7902 Long term (current) use of antithrombotics/antiplatelets: Secondary | ICD-10-CM | POA: Diagnosis not present

## 2024-02-19 DIAGNOSIS — I4510 Unspecified right bundle-branch block: Secondary | ICD-10-CM | POA: Insufficient documentation

## 2024-02-19 DIAGNOSIS — G4733 Obstructive sleep apnea (adult) (pediatric): Secondary | ICD-10-CM | POA: Diagnosis not present

## 2024-02-19 DIAGNOSIS — Z79899 Other long term (current) drug therapy: Secondary | ICD-10-CM | POA: Diagnosis not present

## 2024-02-19 DIAGNOSIS — D472 Monoclonal gammopathy: Secondary | ICD-10-CM | POA: Diagnosis not present

## 2024-02-19 DIAGNOSIS — I69351 Hemiplegia and hemiparesis following cerebral infarction affecting right dominant side: Secondary | ICD-10-CM | POA: Diagnosis not present

## 2024-02-19 DIAGNOSIS — I272 Pulmonary hypertension, unspecified: Secondary | ICD-10-CM | POA: Diagnosis not present

## 2024-02-19 DIAGNOSIS — I130 Hypertensive heart and chronic kidney disease with heart failure and stage 1 through stage 4 chronic kidney disease, or unspecified chronic kidney disease: Secondary | ICD-10-CM | POA: Diagnosis not present

## 2024-02-19 DIAGNOSIS — I428 Other cardiomyopathies: Secondary | ICD-10-CM | POA: Insufficient documentation

## 2024-02-19 DIAGNOSIS — I5042 Chronic combined systolic (congestive) and diastolic (congestive) heart failure: Secondary | ICD-10-CM | POA: Diagnosis not present

## 2024-02-19 DIAGNOSIS — I69392 Facial weakness following cerebral infarction: Secondary | ICD-10-CM | POA: Diagnosis not present

## 2024-02-19 MED ORDER — POTASSIUM CHLORIDE CRYS ER 20 MEQ PO TBCR: EXTENDED_RELEASE_TABLET | ORAL | 3 refills | Status: DC

## 2024-02-19 MED ORDER — TORSEMIDE 20 MG PO TABS: ORAL_TABLET | ORAL | 3 refills | Status: DC

## 2024-02-19 NOTE — Patient Instructions (Signed)
 CHANGE Torsemide  to 60 mg ( 3 tabs) in the morning and 40 mg ( 2 tab) in the evening.  CHANGE Potassium to 40 mEq ( 2 Tabs) in the morning and 20 mEq ( 1 Tab) in the evening.  Blood work in 1 week.  Keep wearing your compression socks.  Your physician recommends that you schedule a follow-up appointment in: 3 months ( January 2026) ** PLEASE CALL THE OFFICE IN DECEMBER TO ARRANGE YOUR FOLLOW UP APPOINTMENT.**  If you have any questions or concerns before your next appointment please send us  a message through [place] or call our office at [phone].    TO LEAVE A MESSAGE FOR THE NURSE SELECT OPTION 2, PLEASE LEAVE A MESSAGE INCLUDING: YOUR NAME DATE OF BIRTH CALL BACK NUMBER REASON FOR CALL**this is important as we prioritize the call backs  YOU WILL RECEIVE A CALL BACK THE SAME DAY AS LONG AS YOU CALL BEFORE 4:00 PM  At the [facility], you and your health needs are our priority. As part of our continuing mission to provide you with exceptional heart care, we have created designated Provider Care Teams. These Care Teams include your primary Cardiologist (physician) and Advanced Practice Providers (APPs- Physician Assistants and Nurse Practitioners) who all work together to provide you with the care you need, when you need it.   You may see any of the following providers on your designated Care Team at your next follow up: Dr Toribio Fuel Dr Ezra Shuck Dr. Morene Brownie Greig Mosses, NP Caffie Shed, [place] [facility] [place], [place] Beckey Coe, NP Jordan Lee, NP Ellouise Class, NP Tinnie Redman, PharmD Jaun Bash, PharmD   Please be sure to bring in all your medications bottles to every appointment.    Thank you for choosing [facility]

## 2024-02-19 NOTE — Progress Notes (Signed)
 ReDS Vest / Clip - 02/19/24 1029       ReDS Vest / Clip   Station Marker C    Ruler Value 30    ReDS Value Range Moderate volume overload    ReDS Actual Value 38

## 2024-02-20 DIAGNOSIS — I5042 Chronic combined systolic (congestive) and diastolic (congestive) heart failure: Secondary | ICD-10-CM | POA: Diagnosis not present

## 2024-02-20 DIAGNOSIS — I69392 Facial weakness following cerebral infarction: Secondary | ICD-10-CM | POA: Diagnosis not present

## 2024-02-20 DIAGNOSIS — I69351 Hemiplegia and hemiparesis following cerebral infarction affecting right dominant side: Secondary | ICD-10-CM | POA: Diagnosis not present

## 2024-02-20 DIAGNOSIS — I130 Hypertensive heart and chronic kidney disease with heart failure and stage 1 through stage 4 chronic kidney disease, or unspecified chronic kidney disease: Secondary | ICD-10-CM | POA: Diagnosis not present

## 2024-02-20 DIAGNOSIS — N184 Chronic kidney disease, stage 4 (severe): Secondary | ICD-10-CM | POA: Diagnosis not present

## 2024-02-20 DIAGNOSIS — E1122 Type 2 diabetes mellitus with diabetic chronic kidney disease: Secondary | ICD-10-CM | POA: Diagnosis not present

## 2024-02-22 DIAGNOSIS — I130 Hypertensive heart and chronic kidney disease with heart failure and stage 1 through stage 4 chronic kidney disease, or unspecified chronic kidney disease: Secondary | ICD-10-CM | POA: Diagnosis not present

## 2024-02-22 DIAGNOSIS — E1122 Type 2 diabetes mellitus with diabetic chronic kidney disease: Secondary | ICD-10-CM | POA: Diagnosis not present

## 2024-02-22 DIAGNOSIS — I5042 Chronic combined systolic (congestive) and diastolic (congestive) heart failure: Secondary | ICD-10-CM | POA: Diagnosis not present

## 2024-02-22 DIAGNOSIS — N184 Chronic kidney disease, stage 4 (severe): Secondary | ICD-10-CM | POA: Diagnosis not present

## 2024-02-22 DIAGNOSIS — I69392 Facial weakness following cerebral infarction: Secondary | ICD-10-CM | POA: Diagnosis not present

## 2024-02-22 DIAGNOSIS — I69351 Hemiplegia and hemiparesis following cerebral infarction affecting right dominant side: Secondary | ICD-10-CM | POA: Diagnosis not present

## 2024-02-26 ENCOUNTER — Ambulatory Visit (HOSPITAL_COMMUNITY)

## 2024-03-03 ENCOUNTER — Telehealth: Payer: Self-pay | Admitting: Podiatry

## 2024-03-03 NOTE — Telephone Encounter (Signed)
 Called to get patient scheduled for next appointment

## 2024-03-04 ENCOUNTER — Ambulatory Visit (HOSPITAL_COMMUNITY)

## 2024-03-06 ENCOUNTER — Telehealth: Payer: Self-pay

## 2024-03-10 ENCOUNTER — Encounter: Admitting: Physical Medicine and Rehabilitation

## 2024-03-11 ENCOUNTER — Encounter: Attending: Physical Medicine and Rehabilitation | Admitting: Physical Medicine and Rehabilitation

## 2024-03-11 VITALS — BP 115/69 | HR 79

## 2024-03-11 DIAGNOSIS — E1159 Type 2 diabetes mellitus with other circulatory complications: Secondary | ICD-10-CM | POA: Diagnosis not present

## 2024-03-11 DIAGNOSIS — E1142 Type 2 diabetes mellitus with diabetic polyneuropathy: Secondary | ICD-10-CM | POA: Diagnosis not present

## 2024-03-11 DIAGNOSIS — R21 Rash and other nonspecific skin eruption: Secondary | ICD-10-CM | POA: Diagnosis not present

## 2024-03-11 DIAGNOSIS — L8990 Pressure ulcer of unspecified site, unspecified stage: Secondary | ICD-10-CM | POA: Diagnosis not present

## 2024-03-11 DIAGNOSIS — Z794 Long term (current) use of insulin: Secondary | ICD-10-CM | POA: Diagnosis not present

## 2024-03-11 DIAGNOSIS — R0602 Shortness of breath: Secondary | ICD-10-CM | POA: Diagnosis not present

## 2024-03-11 DIAGNOSIS — K117 Disturbances of salivary secretion: Secondary | ICD-10-CM | POA: Insufficient documentation

## 2024-03-11 NOTE — Progress Notes (Signed)
 Subjective:    Patient ID: Allen Dyer, male    DOB: Dec 08, 1935, 88 y.o.   MRN: [id number]  HPI: Allen Dyer is a 88 y.o. male who returns for  hospital follow up appointment of his  Ischemic Cerebrovascular Accident and Essential Hypertension. He presented to [facility] ED on 09/02/2023 with complaints of progressive unsteady gait and falls.  Dr. Lou: H&P: 09/02/2023 HPI: Allen Dyer is a 88 y.o. male with medical history significant for chronic combined systolic and diastolic HF, nonischemic cardiomyopathy, CVA, prostate cancer s/p XRT, BPH, GERD, gout, multiple myeloma, nonobstructive CAD, HLD, HTN, T2DM, peripheral neuropathy and CKD stage IV who presented to the ED for evaluation of dizziness and a fall. Patient reports he has had dizziness for the past 2.5 weeks. He describes dizziness as both lightheadedness and spinning sensation. He presented to the ED on 5/3 and workup with labs, EKG and CT head were unremarkable. Patient was discharged home with meclizine  due to concern for peripheral vertigo. Reports his symptoms did not improve with the meclizine . Two days ago, while sitting on the side of his bed, the room started spinning and he fell off the bed hitting the front of his head on the floor. He braced himself with his right hand during the fall. He reports poor coordination and right wrist pain and swelling but denies any vision changes, headaches, focal weakness, numbness, tingling, chest pain, shortness of breath, palpitations, nausea, vomiting or abdominal pain. States he finished treatment for UTI about a week ago and has not had any further dysuria but does continue to have foul odor to his urine.   CT Head/ Cervical Spine IMPRESSION: 1. Hypodense lesions in the cerebellar hemispheres, measuring 3.4 x 3.2 cm on the right and 0.8 cm and 1.8 x 1.2 cm on the left. Please see forthcoming MR for further characterization. 2. No acute fracture or static subluxation of the cervical  spine. 3. Mild-to-moderate multilevel cervical disc degenerative disease throughout the cervical spine.  MR: Brain: MPRESSION: 1. Patchy early subacute ischemic infarcts involving the right greater than left cerebellar hemispheres. Associated minimal petechial blood products at the right cerebellum without hemorrhagic transformation or significant regional mass effect. 2. Irregular flow voids within the V4 segments bilaterally, which could reflect slow flow and/or occlusion. Correlation with dedicated vascular imaging recommended. 3. Underlying moderately advanced cerebral atrophy with mild chronic small vessel ischemic disease.  Neurology consulted and recommended: aspirin  and Plavix  x 3 months then aspirin  alone.   Allen Dyer was admitted to inpatient rehabilitation on 09/05/2023 and discharged home on 09/27/2023. He is receiving Home Health Therapy with [facility]. He denies any pain. He rates his pain 0. Also reports   Allen Dyer was readmitted to [facility] on 10/06/2023 and discharged 10/07/2023, discharge summary reviewed.   Daughter in room  1) Tooth abscess: -he was started on antibiotic -he has pain -he is going to follow with cardiologist for clearance   2) CHF: -he is on gabapentin  -he has follow-up with cardiologist  3) Drooling:  -this has increased despite the scopolamine   -hers right sided facial numbness  4) SOB: -feels thickness on the right side of his face    Pain Inventory Average Pain 0 Pain Right Now 0 My pain is .  In the last 24 hours, has pain interfered with the following? General activity 0 Relation with others 0 Enjoyment of life 0 What TIME of day is your pain at its worst? night and varies Sleep (  in general) Fair  Pain is worse with: unsure Pain improves with: . Relief from Meds: 0     Family History  Problem Relation Age of Onset   Cancer Mother    Congestive Heart Failure Father        died from heart  failure at the age of 65   Cancer Sister    Congestive Heart Failure Brother        died from heart failure at the age of 1   Stroke Neg Hx    Social History   Socioeconomic History   Marital status: Widowed    Spouse name: Not on file   Number of children: 4   Years of education: Not on file   Highest education level: Not on file  Occupational History   Occupation: retired from post electrical engineer  Tobacco Use   Smoking status: Former    Current packs/day: 0.00    Average packs/day: 2.0 packs/day for 10.0 years (20.0 ttl pk-yrs)    Types: Cigarettes    Start date: 1963    Quit date: 1973    Years since quitting: 52.9   Smokeless tobacco: Never  Vaping Use   Vaping status: Never Used  Substance and Sexual Activity   Alcohol use: Not Currently    Comment: quit in 1975, no h/o heavy use   Drug use: Never   Sexual activity: Not on file  Other Topics Concern   Not on file  Social History Narrative   Not on file   Social Drivers of Health   Financial Resource Strain: Not on file  Food Insecurity: No Food Insecurity (09/02/2023)   Hunger Vital Sign    Worried About Running Out of Food in the Last Year: Never true    Ran Out of Food in the Last Year: Never true  Transportation Needs: No Transportation Needs (09/02/2023)   PRAPARE - Administrator, Civil Service (Medical): No    Lack of Transportation (Non-Medical): No  Physical Activity: Not on file  Stress: Not on file  Social Connections: Moderately Integrated (09/02/2023)   Social Connection and Isolation Panel    Frequency of Communication with Friends and Family: More than three times a week    Frequency of Social Gatherings with Friends and Family: More than three times a week    Attends Religious Services: More than 4 times per year    Active Member of [employer] or Organizations: Yes    Attends Banker Meetings: More than 4 times per year    Marital Status: Widowed   Past Surgical  History:  Procedure Laterality Date   RIGHT HEART CATH N/A 07/10/2018   Procedure: RIGHT HEART CATH;  Surgeon: [name], MD;  [facility]: [facility] INVASIVE CV LAB;  Service: Cardiovascular;  Laterality: N/A;   RIGHT/LEFT HEART CATH AND CORONARY ANGIOGRAPHY N/A 06/18/2018   Procedure: RIGHT/LEFT HEART CATH AND CORONARY ANGIOGRAPHY;  Surgeon: Burnard Debby LABOR, MD;  Location: [facility] INVASIVE CV LAB;  Service: Cardiovascular;  Laterality: N/A;   Past Medical History:  Diagnosis Date   Chronic combined systolic and diastolic CHF (congestive heart failure) (HCC)    CKD (chronic kidney disease), stage III (HCC)    Hyperlipidemia    Hypertension    Lower extremity edema    Mild CAD    a. mild-mod by cath 05/2018.   Multiple myeloma (HCC) 11/15/2018   NICM (nonischemic cardiomyopathy) (HCC)    Peripheral neuropathy    Prostate cancer (  HCC)    Status post XRT   Rheumatic fever    Stroke (HCC)    Trifascicular block    Type 2 diabetes mellitus (HCC)    Vertigo    There were no vitals taken for this visit.  Opioid Risk Score:   Fall Risk Score:  `1  Depression screen [facility] 2/9     11/05/2023    3:39 PM 08/16/2023    2:17 PM 07/26/2023   10:32 AM 10/24/2022    9:32 AM 07/26/2022    9:19 AM 02/10/2022    2:24 PM 11/17/2021   11:15 AM  Depression screen PHQ 2/9  Decreased Interest 0 0 0 0 0 0 0  Down, Depressed, Hopeless 0 0 0 0 0 0 0  PHQ - 2 Score 0 0 0 0 0 0 0  Altered sleeping 2        Tired, decreased energy 1        Change in appetite 0        Feeling bad or failure about yourself  0        Trouble concentrating 1        Moving slowly or fidgety/restless 3        Suicidal thoughts 0        PHQ-9 Score 7         Difficult doing work/chores Extremely dIfficult           Data saved with a previous flowsheet row definition     Review of Systems  Gastrointestinal:  Positive for constipation.  Musculoskeletal:  Positive for gait problem.       B/l hand pain  Neurological:  Positive for  weakness and numbness.  All other systems reviewed and are negative.      Objective:   Gen: no distress, normal appearing HEENT: oral mucosa pink and moist, NCAT Cardio: Reg rate Chest: normal effort, normal rate of breathing Abd: soft, non-distended Ext: no edema Psych: pleasant, normal affect Skin: intact Neuro: Alert and oriented x3, strength intact in upper extremities, no spasticity       Assessment & Plan:  Ischemic Cerebrovascular Accident: Continue Home Health Therapy with [facility]. Has a scheduled appointment with Neurology. Continue current medication regimen. Continue to Monitor.  Encouraged eating a lot of fruits and vegetables Discussed that he cannot stand himself Discussed that therapy is going well Discussed that he is ambulating with his walker and navigating stairs Discussed that he does not meet his friends as much due to his limited ambulation Discussed that his friend visits him Discussed that it is good to keep a journal to write one of his daily goals Discussed potential aquatherapy in the future Discussed that he is very motivated to improve in his recovery Continue therapy  2.  Essential Hypertension: Continue current medication regimen. PCP Following. Continue to Monitor.  -Advised checking BP daily at home and logging results to bring into follow-up appointment with PCP and myself. -Reviewed BP meds today.  -Advised regarding healthy foods that can help lower blood pressure and provided with a list: 1) citrus foods- high in vitamins and minerals 2) salmon and other fatty fish - reduces inflammation and oxylipins 3) swiss chard (leafy green)- high level of nitrates 4) pumpkin seeds- one of the best natural sources of magnesium 5) Beans and lentils- high in fiber, magnesium, and potassium 6) Berries- high in flavonoids 7) Amaranth (whole grain, can be cooked similarly to rice and oats)- high in  magnesium and fiber 8) Pistachios- even  more effective at reducing BP than other nuts 9) Carrots- high in phenolic compounds that relax blood vessels and reduce inflammation 10) Celery- contain phthalides that relax tissues of arterial walls 11) Tomatoes- can also improve cholesterol and reduce risk of heart disease 12) Broccoli- good source of magnesium, calcium , and potassium 13) Greek yogurt: high in potassium and calcium  14) Herbs and spices: Celery seed, cilantro, saffron, lemongrass, black cumin, ginseng, cinnamon, cardamom, sweet basil, and ginger 15) Chia and flax seeds- also help to lower cholesterol and blood sugar 16) Beets- high levels of nitrates that relax blood vessels  17) spinach and bananas- high in potassium  -Provided lise of supplements that can help with hypertension:  1) magnesium: one high quality brand is Bioptemizers since it contains all 7 types of magnesium, otherwise over the counter magnesium gluconate 400mg  is a good option 2) B vitamins 3) vitamin D  4) potassium 5) CoQ10 6) L-arginine 7) Vitamin C 8) Beetroot -Educated that goal BP is 120/80. -Made goal to incorporate some of the above foods into diet.     3) Drooling: -continue scopolamine   -discussed following up with neurology   4) Neuropathy, diabetic: -discussed vagal nerve stimulation -d/c gabapentin   5) Type 2 diabetes: -discussed that these are well controlled -discussed that they are getting up to 150/170s sometimes in the evening -discussed that in the morning CBGs is 74 after the glipizide  -discussed that he likes lemonade and sprite  6) Pressure injury: -discussed that he has a bleeding bed sore -referred to wound care clinic for management/consideration of hyperbaric oxygen therapy  7) Rash on testicles -discussed that it could be due to incontinence -discussed applying Desitin

## 2024-03-12 ENCOUNTER — Encounter: Payer: Self-pay | Admitting: Podiatry

## 2024-03-12 ENCOUNTER — Ambulatory Visit: Admitting: Podiatry

## 2024-03-12 DIAGNOSIS — M79675 Pain in left toe(s): Secondary | ICD-10-CM

## 2024-03-12 DIAGNOSIS — B351 Tinea unguium: Secondary | ICD-10-CM

## 2024-03-12 DIAGNOSIS — E1159 Type 2 diabetes mellitus with other circulatory complications: Secondary | ICD-10-CM

## 2024-03-12 DIAGNOSIS — M79674 Pain in right toe(s): Secondary | ICD-10-CM

## 2024-03-12 NOTE — Progress Notes (Signed)
 This patient returns to my office for at risk foot care.  This patient requires this care by a professional since this patient will be at risk due to having  Diabetes and chronic kidney disease.   This patient is unable to cut nails himself  since the patient cannot reach his  nails.These nails are painful walking and wearing shoes.   This patient presents for at risk foot care today.    General Appearance  Alert, conversant and in no acute stress.  Vascular  Dorsalis pedis and posterior tibial  pulses are palpable  bilaterally.  Capillary return is within normal limits  bilaterally. Temperature is within normal limits  bilaterally.  Neurologic  Senn-Weinstein monofilament wire test diminished   bilaterally. Muscle power within normal limits bilaterally.  Nails Thick disfigured discolored nails with subungual debris  from hallux to fifth toes bilaterally. No evidence of bacterial infection or drainage bilaterally.  Orthopedic  No limitations of motion  feet .  No crepitus or effusions noted.  No bony pathology or digital deformities noted.  Arthritis right foot/ankle. Capsulitis 5th met right foot.  Skin  normotropic skin with no porokeratosis noted bilaterally.  No signs of infections or ulcers noted.   Asymptomatic porokeratosis sub 5th  right   Onychomycosis  Pain in right toes  Pain in left toes   Consent was obtained for treatment procedures.   Mechanical debridement of nails 1-5  bilaterally performed with a nail nipper.  Filed with dremel without incident. No infection or ulcer.     Return office visit   4 months       Told patient to return for periodic foot care and evaluation due to potential at risk complications.   Cordella Bold DPM

## 2024-03-13 ENCOUNTER — Ambulatory Visit: Admitting: Cardiology

## 2024-03-13 NOTE — Progress Notes (Deleted)
 Evaluation Performed:  Follow-up visit  Date:  03/13/2024   ID:  Allen Dyer, DOB 1935-05-26, MRN [id number]  PCP:  Regino Slater, MD  Cardiologist:  Ezra Shuck, MD Sleep Medicine: Allen Bihari, MD Electrophysiologist:  None   Chief Complaint:  OSA and HTN  History of Present Illness:    Allen Dyer is a 88 y.o. male with a history of type 2 diabetes mellitus, rheumatic fever, hypertension, peripheral neuropathy with edema, hyperlipidemia, chronic CKD stage III, prostate CA status post XRT.   2D echo 05/29/2018 showed moderate to severe LV dysfunction with EF 30 to 35% with moderately dilated LV and grade 2 diastolic dysfunction, moderate pulmonary hypertension with PASP 59 mmHg.   He underwent R/L heart cath showing mildly elevated right and left heart filling pressures with moderate pulmonary venous HTN with normal PVR 1.7 and preserved CO. He had nonobstructive CAD with 50% pLAD.  He was changed form Lasix  to Torsemide  40mg  BID.   Repeat 2D echo on guideline directed HF therapy revealed persistent LV dysfunction with EF 25-30% and grade 2 DD and mild to moderate AR.  He is followed in AHF clinic.     He was dx with severe OSA with an AHI of 59/hr and nocturnal hypoxemia with O2 desats as low as 63% and was titrated and now on BiPAP at 19/15cm H2O.  He ultimately was changed to auto BiPAP with IPAP max 20cm H2O, EPAP min 5cm H2O and PS 4cm H2O.    He had a CVA May 2025 and was hospitalized until 6/5 and went home and then readmitted shortly after that with another CVA.  At last OV he had not worn his PAP since his last hospitalization.  He also had an abcessed tooth and needed dental work done.  His face had been swollen.  He has had problems since his CVA with his mask fitting.  He was switched to a nasal pillow mask but the pressure was too high to prevent leaking.  He then went to a FFM with the tube on top but it kept leaking. At last OV AHI was elevated and he was referred back to  DME for repeat mask fitting.  He is now here for followup.   He is doing well with his PAP device and thinks that he has gotten used to it.  He tolerates the mask and feels the pressure is adequate.  Since going on PAP he feels rested in the am and has no significant daytime sleepiness.  He denies any significant mouth or nasal dryness or nasal congestion.  HE does not think that he snores. An Epworth Sleepiness Scale score was calculated the office today and this endorsed at ### arguing against residual daytime sleepiness. Patient denies any episodes of bruxism, restless legs, No gagging hallucinations or cataplectic events.    Prior CV studies:   The following studies were reviewed today:  PAP compliance download  Past Medical History:  Diagnosis Date   Chronic combined systolic and diastolic CHF (congestive heart failure) (HCC)    CKD (chronic kidney disease), stage III (HCC)    Hyperlipidemia    Hypertension    Lower extremity edema    Mild CAD    a. mild-mod by cath 05/2018.   Multiple myeloma (HCC) 11/15/2018   NICM (nonischemic cardiomyopathy) (HCC)    Peripheral neuropathy    Prostate cancer (HCC)    Status post XRT   Rheumatic fever    Stroke [facility])    Trifascicular block  Type 2 diabetes mellitus (HCC)    Vertigo    Past Surgical History:  Procedure Laterality Date   RIGHT HEART CATH N/A 07/10/2018   Procedure: RIGHT HEART CATH;  Surgeon: [name], MD;  Location: [facility] INVASIVE CV LAB;  Service: Cardiovascular;  Laterality: N/A;   RIGHT/LEFT HEART CATH AND CORONARY ANGIOGRAPHY N/A 06/18/2018   Procedure: RIGHT/LEFT HEART CATH AND CORONARY ANGIOGRAPHY;  Surgeon: Burnard Debby LABOR, MD;  Location: [facility] INVASIVE CV LAB;  Service: Cardiovascular;  Laterality: N/A;     No outpatient medications have been marked as taking for the 03/13/24 encounter (Appointment) with Allen Allen SAUNDERS, MD.     Allergies:   Tramadol, Finasteride, Jardiance  [empagliflozin ], Lisinopril,  Ciprofloxacin, Simvastatin, and Sulfa antibiotics   Social History   Tobacco Use   Smoking status: Former    Current packs/day: 0.00    Average packs/day: 2.0 packs/day for 10.0 years (20.0 ttl pk-yrs)    Types: Cigarettes    Start date: 1963    Quit date: 1973    Years since quitting: 52.9   Smokeless tobacco: Never  Vaping Use   Vaping status: Never Used  Substance Use Topics   Alcohol use: Not Currently    Comment: quit in 1975, no h/o heavy use   Drug use: Never     Family Hx: The patient's family history includes Cancer in his mother and sister; Congestive Heart Failure in his brother and father. There is no history of Stroke.  ROS:   Please see the history of present illness.     All other systems reviewed and are negative.   Labs/Other Tests and Data Reviewed:    Recent Labs: 09/09/2023: TSH 2.615 01/03/2024: ALT 9 01/30/2024: B Natriuretic Peptide 670.1 02/09/2024: BUN 16; Creatinine, Ser 1.83; Hemoglobin 10.1; Platelets 165; Potassium 4.0; Sodium 145   Recent Lipid Panel Lab Results  Component Value Date/Time   CHOL 126 10/07/2023 06:59 AM   TRIG 75 10/07/2023 06:59 AM   HDL 38 (L) 10/07/2023 06:59 AM   CHOLHDL 3.3 10/07/2023 06:59 AM   LDLCALC 73 10/07/2023 06:59 AM    Wt Readings from Last 3 Encounters:  02/19/24 196 lb (88.9 kg)  01/30/24 199 lb 12.8 oz (90.6 kg)  01/04/24 193 lb 9.6 oz (87.8 kg)     Objective:    Vital Signs:  There were no vitals taken for this visit.  GEN: Well nourished, well developed in no acute distress HEENT: Normal NECK: No JVD; No carotid bruits LYMPHATICS: No lymphadenopathy CARDIAC:RRR, no murmurs, rubs, gallops RESPIRATORY:  Clear to auscultation without rales, wheezing or rhonchi  ABDOMEN: Soft, non-tender, non-distended MUSCULOSKELETAL:  No edema; No deformity  SKIN: Warm and dry NEUROLOGIC:  Alert and oriented x 3 PSYCHIATRIC:  Normal affect  ASSESSMENT & PLAN:    OSA - The patient is tolerating PAP  therapy well without any problems. The PAP download performed by his DME was personally reviewed and interpreted by me today and showed an AHI of 9.7/hr on Auto BiPAP  with 3% compliance in using more than 4 hours nightly.  The patient has been using and benefiting from PAP use and will continue to benefit from therapy.   HTN -BP controlled on exam today -continue Carvedilol  6.25mg  BID, Hydralazine  10mg  TID, Imdur  15mg  daily with PRN refills  Medication Adjustments/Labs and Tests Ordered: Current medicines are reviewed at length with the patient today.  Concerns regarding medicines are outlined above.  Tests Ordered: No orders of the defined types were  placed in this encounter.   Medication Changes: No orders of the defined types were placed in this encounter.    Disposition:  Follow up in 2 months  Signed, Allen Bihari, MD  03/13/2024 6:51 AM    [facility]

## 2024-03-17 ENCOUNTER — Other Ambulatory Visit: Payer: Self-pay

## 2024-03-17 ENCOUNTER — Emergency Department (HOSPITAL_COMMUNITY)
Admission: EM | Admit: 2024-03-17 | Discharge: 2024-03-17 | Discharge status: Against Medical Advice | Attending: Emergency Medicine | Admitting: Emergency Medicine

## 2024-03-17 ENCOUNTER — Encounter (HOSPITAL_COMMUNITY): Payer: Self-pay | Admitting: Emergency Medicine

## 2024-03-17 DIAGNOSIS — M7989 Other specified soft tissue disorders: Secondary | ICD-10-CM | POA: Insufficient documentation

## 2024-03-17 DIAGNOSIS — Z8673 Personal history of transient ischemic attack (TIA), and cerebral infarction without residual deficits: Secondary | ICD-10-CM | POA: Diagnosis not present

## 2024-03-17 DIAGNOSIS — Z5321 Procedure and treatment not carried out due to patient leaving prior to being seen by health care provider: Secondary | ICD-10-CM | POA: Diagnosis not present

## 2024-03-17 DIAGNOSIS — R52 Pain, unspecified: Secondary | ICD-10-CM | POA: Diagnosis not present

## 2024-03-17 DIAGNOSIS — M25532 Pain in left wrist: Secondary | ICD-10-CM | POA: Diagnosis not present

## 2024-03-17 LAB — I-STAT CG4 LACTIC ACID, ED: Lactic Acid, Venous: 1.7 mmol/L (ref 0.5–1.9)

## 2024-03-17 LAB — CBC WITH DIFFERENTIAL/PLATELET
Abs Immature Granulocytes: 0.03 K/uL (ref 0.00–0.07)
Basophils Absolute: 0 K/uL (ref 0.0–0.1)
Basophils Relative: 0 %
Eosinophils Absolute: 0 K/uL (ref 0.0–0.5)
Eosinophils Relative: 0 %
HCT: 31 % — ABNORMAL LOW (ref 39.0–52.0)
Hemoglobin: 9.7 g/dL — ABNORMAL LOW (ref 13.0–17.0)
Immature Granulocytes: 0 %
Lymphocytes Relative: 10 %
Lymphs Abs: 0.8 K/uL (ref 0.7–4.0)
MCH: 31.6 pg (ref 26.0–34.0)
MCHC: 31.3 g/dL (ref 30.0–36.0)
MCV: 101 fL — ABNORMAL HIGH (ref 80.0–100.0)
Monocytes Absolute: 1.1 K/uL — ABNORMAL HIGH (ref 0.1–1.0)
Monocytes Relative: 13 %
Neutro Abs: 6.3 K/uL (ref 1.7–7.7)
Neutrophils Relative %: 77 %
Platelets: 201 K/uL (ref 150–400)
RBC: 3.07 MIL/uL — ABNORMAL LOW (ref 4.22–5.81)
RDW: 13.7 % (ref 11.5–15.5)
WBC: 8.3 K/uL (ref 4.0–10.5)
nRBC: 0 % (ref 0.0–0.2)

## 2024-03-17 LAB — COMPREHENSIVE METABOLIC PANEL WITH GFR
ALT: 9 U/L (ref 0–44)
AST: 19 U/L (ref 15–41)
Albumin: 3.5 g/dL (ref 3.5–5.0)
Alkaline Phosphatase: 66 U/L (ref 38–126)
Anion gap: 10 (ref 5–15)
BUN: 22 mg/dL (ref 8–23)
CO2: 31 mmol/L (ref 22–32)
Calcium: 10 mg/dL (ref 8.9–10.3)
Chloride: 99 mmol/L (ref 98–111)
Creatinine, Ser: 1.97 mg/dL — ABNORMAL HIGH (ref 0.61–1.24)
GFR, Estimated: 32 mL/min — ABNORMAL LOW (ref >60)
Glucose, Bld: 188 mg/dL — ABNORMAL HIGH (ref 70–99)
Potassium: 3.5 mmol/L (ref 3.5–5.1)
Sodium: 141 mmol/L (ref 135–145)
Total Bilirubin: 0.6 mg/dL (ref 0.0–1.2)
Total Protein: 7.5 g/dL (ref 6.5–8.1)

## 2024-03-17 NOTE — ED Triage Notes (Signed)
 Patient c/o hand and wrist swelling  x 1 week. Patient report worsening pain and swelling on left wrist. Family report UC recommended to be seen for further work up. Patient denies any recent injury or fall. Patient denies fever at home. Hx CVA , left sided deficit.

## 2024-03-17 NOTE — ED Notes (Signed)
 Pt left the building and ask to have their IV removed.

## 2024-03-18 ENCOUNTER — Emergency Department (HOSPITAL_COMMUNITY)

## 2024-03-18 ENCOUNTER — Encounter (HOSPITAL_COMMUNITY): Payer: Self-pay

## 2024-03-18 ENCOUNTER — Emergency Department (HOSPITAL_COMMUNITY)
Admission: EM | Admit: 2024-03-18 | Discharge: 2024-03-19 | Disposition: A | Discharge status: Home / Self Care | Attending: Emergency Medicine | Admitting: Emergency Medicine

## 2024-03-18 DIAGNOSIS — R2232 Localized swelling, mass and lump, left upper limb: Secondary | ICD-10-CM | POA: Insufficient documentation

## 2024-03-18 DIAGNOSIS — Z79899 Other long term (current) drug therapy: Secondary | ICD-10-CM | POA: Diagnosis not present

## 2024-03-18 DIAGNOSIS — I5023 Acute on chronic systolic (congestive) heart failure: Secondary | ICD-10-CM | POA: Diagnosis not present

## 2024-03-18 DIAGNOSIS — I509 Heart failure, unspecified: Secondary | ICD-10-CM | POA: Insufficient documentation

## 2024-03-18 DIAGNOSIS — N189 Chronic kidney disease, unspecified: Secondary | ICD-10-CM | POA: Diagnosis not present

## 2024-03-18 DIAGNOSIS — I130 Hypertensive heart and chronic kidney disease with heart failure and stage 1 through stage 4 chronic kidney disease, or unspecified chronic kidney disease: Secondary | ICD-10-CM | POA: Diagnosis not present

## 2024-03-18 DIAGNOSIS — R52 Pain, unspecified: Secondary | ICD-10-CM | POA: Diagnosis not present

## 2024-03-18 DIAGNOSIS — M7989 Other specified soft tissue disorders: Secondary | ICD-10-CM | POA: Diagnosis not present

## 2024-03-18 DIAGNOSIS — M25832 Other specified joint disorders, left wrist: Secondary | ICD-10-CM | POA: Diagnosis not present

## 2024-03-18 DIAGNOSIS — M2580 Other specified joint disorders, unspecified joint: Secondary | ICD-10-CM | POA: Diagnosis not present

## 2024-03-18 DIAGNOSIS — M79642 Pain in left hand: Secondary | ICD-10-CM | POA: Diagnosis not present

## 2024-03-18 DIAGNOSIS — Z7984 Long term (current) use of oral hypoglycemic drugs: Secondary | ICD-10-CM | POA: Insufficient documentation

## 2024-03-18 DIAGNOSIS — R0602 Shortness of breath: Secondary | ICD-10-CM | POA: Diagnosis not present

## 2024-03-18 DIAGNOSIS — I517 Cardiomegaly: Secondary | ICD-10-CM | POA: Diagnosis not present

## 2024-03-18 DIAGNOSIS — M19042 Primary osteoarthritis, left hand: Secondary | ICD-10-CM | POA: Diagnosis not present

## 2024-03-18 DIAGNOSIS — I700 Atherosclerosis of aorta: Secondary | ICD-10-CM | POA: Diagnosis not present

## 2024-03-18 DIAGNOSIS — M19032 Primary osteoarthritis, left wrist: Secondary | ICD-10-CM | POA: Diagnosis not present

## 2024-03-18 DIAGNOSIS — R600 Localized edema: Secondary | ICD-10-CM | POA: Diagnosis not present

## 2024-03-18 DIAGNOSIS — Z7982 Long term (current) use of aspirin: Secondary | ICD-10-CM | POA: Insufficient documentation

## 2024-03-18 DIAGNOSIS — E1122 Type 2 diabetes mellitus with diabetic chronic kidney disease: Secondary | ICD-10-CM | POA: Insufficient documentation

## 2024-03-18 DIAGNOSIS — I110 Hypertensive heart disease with heart failure: Secondary | ICD-10-CM | POA: Diagnosis not present

## 2024-03-18 DIAGNOSIS — Z7902 Long term (current) use of antithrombotics/antiplatelets: Secondary | ICD-10-CM | POA: Diagnosis not present

## 2024-03-18 LAB — CBC WITH DIFFERENTIAL/PLATELET
Abs Immature Granulocytes: 0.03 K/uL (ref 0.00–0.07)
Basophils Absolute: 0 K/uL (ref 0.0–0.1)
Basophils Relative: 0 %
Eosinophils Absolute: 0.1 K/uL (ref 0.0–0.5)
Eosinophils Relative: 1 %
HCT: 32.4 % — ABNORMAL LOW (ref 39.0–52.0)
Hemoglobin: 10.2 g/dL — ABNORMAL LOW (ref 13.0–17.0)
Immature Granulocytes: 0 %
Lymphocytes Relative: 14 %
Lymphs Abs: 1.1 K/uL (ref 0.7–4.0)
MCH: 31.9 pg (ref 26.0–34.0)
MCHC: 31.5 g/dL (ref 30.0–36.0)
MCV: 101.3 fL — ABNORMAL HIGH (ref 80.0–100.0)
Monocytes Absolute: 0.8 K/uL (ref 0.1–1.0)
Monocytes Relative: 11 %
Neutro Abs: 5.5 K/uL (ref 1.7–7.7)
Neutrophils Relative %: 74 %
Platelets: 217 K/uL (ref 150–400)
RBC: 3.2 MIL/uL — ABNORMAL LOW (ref 4.22–5.81)
RDW: 13.7 % (ref 11.5–15.5)
WBC: 7.5 K/uL (ref 4.0–10.5)
nRBC: 0 % (ref 0.0–0.2)

## 2024-03-18 LAB — COMPREHENSIVE METABOLIC PANEL WITH GFR
ALT: 9 U/L (ref 0–44)
AST: 20 U/L (ref 15–41)
Albumin: 3.6 g/dL (ref 3.5–5.0)
Alkaline Phosphatase: 70 U/L (ref 38–126)
Anion gap: 10 (ref 5–15)
BUN: 27 mg/dL — ABNORMAL HIGH (ref 8–23)
CO2: 32 mmol/L (ref 22–32)
Calcium: 10.5 mg/dL — ABNORMAL HIGH (ref 8.9–10.3)
Chloride: 100 mmol/L (ref 98–111)
Creatinine, Ser: 1.99 mg/dL — ABNORMAL HIGH (ref 0.61–1.24)
GFR, Estimated: 32 mL/min — ABNORMAL LOW (ref >60)
Glucose, Bld: 101 mg/dL — ABNORMAL HIGH (ref 70–99)
Potassium: 3.6 mmol/L (ref 3.5–5.1)
Sodium: 143 mmol/L (ref 135–145)
Total Bilirubin: 0.5 mg/dL (ref 0.0–1.2)
Total Protein: 7.7 g/dL (ref 6.5–8.1)

## 2024-03-18 LAB — PRO BRAIN NATRIURETIC PEPTIDE: Pro Brain Natriuretic Peptide: 11007 pg/mL — ABNORMAL HIGH (ref <300.0)

## 2024-03-18 LAB — LACTIC ACID, PLASMA: Lactic Acid, Venous: 1.1 mmol/L (ref 0.5–1.9)

## 2024-03-18 LAB — TROPONIN T, HIGH SENSITIVITY
Troponin T High Sensitivity: 99 ng/L — ABNORMAL HIGH (ref 0–19)
Troponin T High Sensitivity: 96 ng/L — ABNORMAL HIGH (ref 0–19)

## 2024-03-18 MED ORDER — SODIUM CHLORIDE 0.9 % IV BOLUS: 1000.0000 mL | Freq: Once | INTRAVENOUS | Status: DC

## 2024-03-18 MED ORDER — PREDNISONE 10 MG PO TABS: 20.0000 mg | ORAL_TABLET | Freq: Every day | ORAL | 0 refills | Status: AC

## 2024-03-18 MED ORDER — FUROSEMIDE 10 MG/ML IJ SOLN
60.0000 mg | Freq: Once | INTRAMUSCULAR | Status: AC
  Administered 2024-03-18: 60 mg via INTRAVENOUS
  Filled 2024-03-18: qty 8

## 2024-03-18 MED ORDER — OXYCODONE HCL 5 MG PO TABS: 5.0000 mg | ORAL_TABLET | Freq: Four times a day (QID) | ORAL | 0 refills | Status: DC | PRN

## 2024-03-18 MED ORDER — CEPHALEXIN 500 MG PO CAPS: 500.0000 mg | ORAL_CAPSULE | Freq: Two times a day (BID) | ORAL | 0 refills | Status: AC

## 2024-03-18 NOTE — ED Provider Notes (Signed)
 [place] [facility] AT [facility] Provider Note   CSN: [id number] Arrival date & time: 03/18/24  1314     Patient presents with: No chief complaint on file.   Blair Lundeen is a 88 y.o. male.  Patient is an 88 year old male with a history of type 2 diabetes, CKD, hyperlipidemia, hypertension, and CHF presents to the ED for increasing left hand/wrist swelling for the past 2 weeks.  Patient notes he is having a lot of pain to the hand and wrist and having difficulty moving the hand because of this.  Denies fall or injury.  States he was seen at [facility] a couple days ago and sent to ED for evaluation.  States he has been taking his other medications as prescribed.  He notes he has had some increasing shortness of breath and chest pain.  Denies fevers, chills, dizziness, syncope, abdominal pain, nausea/vomiting/diarrhea.   HPI     Prior to Admission medications   Medication Sig Start Date End Date Taking? Authorizing Provider  cephALEXin  (KEFLEX ) 500 MG capsule Take 1 capsule (500 mg total) by mouth 2 (two) times daily for 7 days. 03/18/24 03/25/24 Yes Edword Cu, Thersia RAMAN, PA-C  oxyCODONE  (ROXICODONE ) 5 MG immediate release tablet Take 1 tablet (5 mg total) by mouth every 6 (six) hours as needed for up to 10 doses for severe pain (pain score 7-10). 03/18/24  Yes Lev Cervone, Thersia RAMAN, PA-C  predniSONE  (DELTASONE ) 10 MG tablet Take 2 tablets (20 mg total) by mouth daily for 5 days. 03/18/24 03/23/24 Yes Amed Datta, Thersia RAMAN, PA-C  acetaminophen  (TYLENOL ) 325 MG tablet Take 2 tablets (650 mg total) by mouth every 4 (four) hours as needed for mild pain (pain score 1-3) (or temp > 37.5 C (99.5 F)). 09/05/23   Angiulli, Toribio PARAS, PA-C  Ascorbic Acid (VITAMIN C) 1000 MG tablet Take 4,000 mg by mouth every morning.    [provider]  aspirin  81 MG chewable tablet Chew 81 mg by mouth daily. 11/27/23   [provider]  Blood Glucose Monitoring Suppl (FREESTYLE LITE)  w/Device KIT 1 Device by Does not apply route daily in the afternoon. 05/24/21   Shamleffer, Ibtehal Jaralla, MD  carvedilol  (COREG ) 6.25 MG tablet Take 1 tablet (6.25 mg total) by mouth 2 (two) times daily with a meal. 11/29/23   [name], MD  clopidogrel  (PLAVIX ) 75 MG tablet Take 1 tablet (75 mg total) by mouth daily. 09/26/23   Angiulli, Toribio PARAS, PA-C  cyanocobalamin  1000 MCG tablet Take 1 tablet (1,000 mcg total) by mouth every morning. 09/26/23   Angiulli, Toribio PARAS, PA-C  ezetimibe  (ZETIA ) 10 MG tablet Take 1 tablet (10 mg total) by mouth daily. 11/13/23 02/19/24  Glena Harlene HERO, FNP  Garlic 1000 MG CAPS Take 1,000 mg by mouth every morning.    [provider]  glipiZIDE  (GLUCOTROL ) 5 MG tablet Take by mouth 2 (two) times daily before a meal.    [provider]  glucose blood (FREESTYLE LITE) test strip 1 each by Other route daily in the afternoon. Use as instructed 05/24/21   Shamleffer, Ibtehal Jaralla, MD  hydrALAZINE  (APRESOLINE ) 10 MG tablet Take 1 tablet (10 mg total) by mouth 3 (three) times daily. 01/30/24   [name], MD  isosorbide  mononitrate (IMDUR ) 30 MG 24 hr tablet Take 0.5 tablets (15 mg total) by mouth every morning. 10/09/23   Perri DELENA Meliton Mickey., MD  Omega-3 Fatty Acids (FISH OIL) 1200 MG CAPS Take 1,200 mg  by mouth daily.    [provider]  potassium chloride  SA (KLOR-CON  M) 20 MEQ tablet Take 2 tablets (40 mEq total) by mouth every morning AND 1 tablet (20 mEq total) every evening. 02/19/24   Milford, Harlene HERO, FNP  scopolamine  (TRANSDERM-SCOP) 1 MG/3DAYS Place 1 patch (1.5 mg total) onto the skin every 3 (three) days. 12/07/23   Raulkar, Sven SQUIBB, MD  tamsulosin  (FLOMAX ) 0.4 MG CAPS capsule Take 1 capsule (0.4 mg total) by mouth 2 (two) times daily. 09/26/23   Angiulli, Toribio PARAS, PA-C  torsemide  (DEMADEX ) 20 MG tablet Take 3 tablets (60 mg total) by mouth every morning AND 2 tablets (40 mg total) every evening. 02/19/24   Glena Harlene HERO, FNP    Allergies: Tramadol, Finasteride, Jardiance  [empagliflozin ], Lisinopril, Ciprofloxacin, Simvastatin, and Sulfa antibiotics    Review of Systems  Constitutional:  Negative for fever.  Respiratory:  Positive for shortness of breath.   Cardiovascular:  Positive for chest pain.  Gastrointestinal:  Negative for abdominal pain, diarrhea, nausea and vomiting.  Neurological:  Negative for headaches.  All other systems reviewed and are negative.   Updated Vital Signs BP 116/76   Pulse 96   Temp 98.2 F (36.8 C) (Oral)   Resp (!) 21   Ht 5' 7.5 (1.715 m)   Wt 88.9 kg   SpO2 100%   BMI 30.24 kg/m   Physical Exam Constitutional:      Appearance: Normal appearance.  HENT:     Head: Normocephalic and atraumatic.     Nose: Nose normal.     Mouth/Throat:     Mouth: Mucous membranes are moist.     Pharynx: Oropharynx is clear.  Cardiovascular:     Rate and Rhythm: Normal rate.  Pulmonary:     Effort: Pulmonary effort is normal.     Breath sounds: Normal breath sounds.  Musculoskeletal:     Comments: Radial pulses 2+ on the left.  Left hand/wrist edematous and tender to palpation.  Nonpitting edema.  He cannot move the wrist or hand secondary to pain.  Skin:    General: Skin is warm and dry.     Comments: Very small stage I pressure ulcer present in the right gluteal cleft.  No signs of cellulitis or drainage.  Neurological:     Mental Status: He is alert and oriented to person, place, and time.  Psychiatric:        Mood and Affect: Mood normal.        Behavior: Behavior normal.     (all labs ordered are listed, but only abnormal results are displayed) Labs Reviewed  COMPREHENSIVE METABOLIC PANEL WITH GFR - Abnormal; Notable for the following components:      Result Value   Glucose, Bld 101 (*)    BUN 27 (*)    Creatinine, Ser 1.99 (*)    Calcium  10.5 (*)    GFR, Estimated 32 (*)    All other components within normal limits  CBC WITH  DIFFERENTIAL/PLATELET - Abnormal; Notable for the following components:   RBC 3.20 (*)    Hemoglobin 10.2 (*)    HCT 32.4 (*)    MCV 101.3 (*)    All other components within normal limits  PRO BRAIN NATRIURETIC PEPTIDE - Abnormal; Notable for the following components:   Pro Brain Natriuretic Peptide 11,007.0 (*)    All other components within normal limits  TROPONIN T, HIGH SENSITIVITY - Abnormal; Notable for the following components:   Troponin T  High Sensitivity 99 (*)    All other components within normal limits  TROPONIN T, HIGH SENSITIVITY - Abnormal; Notable for the following components:   Troponin T High Sensitivity 96 (*)    All other components within normal limits  LACTIC ACID, PLASMA  TROPONIN T, HIGH SENSITIVITY    EKG: EKG Interpretation Date/Time:  Tuesday March 18 2024 21:14:07 EST Ventricular Rate:  86 PR Interval:    QRS Duration:  172 QT Interval:  403 QTC Calculation: 482 R Axis:   -88  Text Interpretation: Atrial fibrillation Paired ventricular premature complexes RBBB and LAFB Confirmed by Lenor Hollering [phone]) on 03/18/2024 9:18:42 PM  Radiology: UE Venous Duplex ([facility] and [facility] ONLY) Result Date: 03/18/2024 UPPER VENOUS STUDY  Patient Name:  DIANTE BARLEY  Date of Exam:   03/18/2024 Medical Rec #: [id number]      Accession #:    [id number] Date of Birth: November 12, 1935      Patient Gender: M Patient Age:   57 years Exam Location:  [facility] Procedure:      VAS US  UPPER EXTREMITY VENOUS DUPLEX Referring Phys: Estie Sproule --------------------------------------------------------------------------------  Indications: Pain, and Swelling Comparison Study: No prior exam. Performing Technologist: Edilia Elden Appl  Examination Guidelines: A complete evaluation includes B-mode imaging, spectral Doppler, color Doppler, and power Doppler as needed of all accessible portions of each vessel. Bilateral testing is considered an integral part of a complete examination.  Limited examinations for reoccurring indications may be performed as noted.  Right Findings: +----------+------------+---------+-----------+----------+-------+ RIGHT     CompressiblePhasicitySpontaneousPropertiesSummary +----------+------------+---------+-----------+----------+-------+ IJV           Full       Yes       Yes                      +----------+------------+---------+-----------+----------+-------+ Subclavian               Yes       Yes                      +----------+------------+---------+-----------+----------+-------+  Left Findings: +----------+------------+---------+-----------+----------+-----------+ LEFT      CompressiblePhasicitySpontaneousProperties  Summary   +----------+------------+---------+-----------+----------+-----------+ IJV                      Yes       Yes              Resistance. +----------+------------+---------+-----------+----------+-----------+ Subclavian               Yes       Yes                          +----------+------------+---------+-----------+----------+-----------+ Axillary      Full       Yes       Yes                          +----------+------------+---------+-----------+----------+-----------+ Brachial      Full       Yes       Yes                          +----------+------------+---------+-----------+----------+-----------+ Radial        Full                                              +----------+------------+---------+-----------+----------+-----------+  Ulnar         Full                                              +----------+------------+---------+-----------+----------+-----------+ Cephalic      Full       Yes       Yes                          +----------+------------+---------+-----------+----------+-----------+ Basilic       Full       Yes       Yes                          +----------+------------+---------+-----------+----------+-----------+  Summary:  Right: No  evidence of thrombosis in the subclavian.  Left: No evidence of deep vein thrombosis in the upper extremity. No evidence of superficial vein thrombosis in the upper extremity.  *See table(s) above for measurements and observations.    Preliminary    DG Wrist Complete Left Result Date: 03/18/2024 EXAM: 3 OR MORE VIEW(S) XRAY OF THE LEFT WRIST 03/18/2024 03:50:00 PM COMPARISON: None available. CLINICAL HISTORY: swelling, pain FINDINGS: BONES AND JOINTS: Degenerative changes of the radiocarpal joint with joint space narrowing. Calcifications in the triangular fibrocartilage region. Probable CPPD arthropathy. Degenerative subcortical cystic lesion on the radial distal margin of the ulna. No acute fracture. No joint dislocation. SOFT TISSUES: Soft tissue swelling surrounding the wrist. IMPRESSION: 1. Degenerative changes of the radiocarpal joint with joint space narrowing, and calcifications in the triangular fibrocartilage region consistent with probable CPPD arthropathy. 2. Soft tissue swelling surrounding the wrist. 3. Degenerative subcortical cystic lesion on the radial distal margin of the ulna. Electronically signed by: Ryan Salvage MD 03/18/2024 04:39 PM EST RP Workstation: [id number]   DG Hand Complete Left Result Date: 03/18/2024 EXAM: 3 OR MORE VIEW(S) XRAY OF THE LEFT HAND 03/18/2024 03:50:00 PM COMPARISON: None available. CLINICAL HISTORY: swelling, pain FINDINGS: BONES AND JOINTS: No acute fracture. No focal osseous lesion. No joint dislocation. Mild interphalangeal articular space narrowing compatible with osteoarthritis. Mild chondrocalcinosis including the TFCC disc with some faint calcifications along the radial margin of the 3rd MCP joint. Cannot exclude CPPD arthropathy. Faint calcifications along the volar and dorsal margins of the carpus. Degenerative findings at the 1st MCP joint. SOFT TISSUES: Subcutaneous edema along the wrist region. IMPRESSION: 1. Mild interphalangeal  osteoarthritis. 2. Mild chondrocalcinosis including the TFCC disc with faint calcifications along the radial margin of the 3rd MCP joint and volar/dorsal margins of the carpus; CPPD arthropathy cannot be excluded. 3. Degenerative changes at the 1st MCP joint. 4. Subcutaneous edema along the wrist region. Electronically signed by: Ryan Salvage MD 03/18/2024 04:38 PM EST RP Workstation: [id number]   DG Chest 2 View Result Date: 03/18/2024 EXAM: 2 VIEW(S) XRAY OF THE CHEST 03/18/2024 03:50:00 PM COMPARISON: 10/06/2023 CLINICAL HISTORY: sob FINDINGS: LUNGS AND PLEURA: Indistinct pulmonary vasculature suggesting pulmonary venous hypertension. No focal pulmonary opacity. No pleural effusion. No pneumothorax. HEART AND MEDIASTINUM: Cardiomegaly. Atherosclerotic calcification of the aortic arch. BONES AND SOFT TISSUES: Thoracic spondylosis. IMPRESSION: 1. Indistinct pulmonary vasculature suggesting pulmonary venous hypertension. 2. Cardiomegaly and atherosclerotic calcification of the aortic arch. 3. Thoracic spondylosis. Electronically signed by: Ryan Salvage MD 03/18/2024 04:34 PM EST RP Workstation: [id number]      Medications Ordered in the  ED  furosemide  (LASIX ) injection 60 mg (60 mg Intravenous Given 03/18/24 2028)    Clinical Course as of 03/18/24 2334  Tue Mar 18, 2024  1851 Ultrasound of the left arm preliminary negative for DVT. [AY]  1933 Hemoglobin(!): 10.2 Similar to baseline [AY]  1933 Creatinine(!): 1.99 Similar to baseline [AY]  1939 Last echo 5 of 2025 [AY]  1939 Torsemide  dose is adjusted/increased on 02/19/2024 [AY]  1943 Previous BNP on 01/30/2024 was 670 [AY]  2010 Consulted hospitalist on-call for concerns of admission due to elevated proBNP.  Notes that this is most likely the same or even improved from the 670 BNP on 10/8 as it is a different test.  As there is no increased oxygen demand, advised he can continue diuretics and follow-up with cardiologist outpatient  [AY]    Clinical Course User Index [AY] Neysa Thersia RAMAN, PA-C                                Medical Decision Making Amount and/or Complexity of Data Reviewed Labs: ordered. Decision-making details documented in ED Course. Radiology: ordered.  Risk Prescription drug management.   Patient is a 88 year old male with history of type 2 diabetes, CKD, hypertension, and CHF who presents to the ED for left hand/wrist swelling for the past 2 weeks.  Patient also notes some increasing shortness of breath.  Daughter would also like a potential sacral wound evaluated.  On exam patient is alert and in no acute distress.  Physical exam as noted above.  Hemoglobin and creatinine stable compared to baseline.  proBNP elevated at 11,000.  Initial troponin 99, second troponin 96.  Lactic unremarkable.  X-ray of the left hand and wrist shows subcu edema but no acute signs infection.  Chest x-ray reveals indistinct pulmonary vascular congestion but no acute consolidation or infiltrate.  No obvious pleural effusions.  Ultrasound of the left upper extremity preliminary negative for DVT.  Differential from the arm swelling includes arthritis, inflammatory disease, cellulitis, DVT.  Less concern for cellulitis as no leukocytosis.  Suspect inflammatory versus arthritis but due to patient's age and risk factors, will treat empirically for cellulitis as well.  There was a ring on the left ring finger that was successfully removed as well.  proBNP was noted to be significantly elevated as well.  Last BNP was noted to be 678 a month and a half prior.  Daughter at bedside does note that she decreased patient's diuretic for the last week as she was unsure how long he was supposed to be on the higher dose.  His vital signs were stable including 100% oxygen on room air.  I did initially consult the hospitalist for concerns of admission due to CHF exacerbation.  Advised that there is little concern due to the discrepancy  between the 2 tests and this is most likely not significantly elevated.  Advised that if patient is stable otherwise, he can follow-up outpatient.  Upon attempting to discharge patient, the nurse did note PVCs on EKG.  EKG was repeated that did show occasional PVCs.  We did hold off on discharge while waiting for the second troponin.  Second troponin did decrease to 96.  Less concerns for ACS at this time.  Patient has been noted to have PVCs previously as well.  Will plan to discharge patient home this evening.  Advised to increase previous diuretic dose.  Has been prescribed Keflex  and a small dose  of prednisone .  Symptomatic care discussed with daughter.  Advised PCP or cardiology follow-up for BNP recheck as well as medication adjustment as needed.  Strict return precautions provided for worsening symptoms.    Final diagnoses:  Acute on chronic congestive heart failure, unspecified heart failure type (HCC)  Swelling of left hand    ED Discharge Orders          Ordered    oxyCODONE  (ROXICODONE ) 5 MG immediate release tablet  Every 6 hours PRN        03/18/24 2054    cephALEXin  (KEFLEX ) 500 MG capsule  2 times daily        03/18/24 2127    predniSONE  (DELTASONE ) 10 MG tablet  Daily        03/18/24 2316               Neysa Thersia GORMAN DEVONNA 03/18/24 2334    Lenor Hollering, MD 03/25/24 1501

## 2024-03-18 NOTE — ED Notes (Signed)
 Pt given turkey sandwich and water, no ice.  After completion of meal, noted on the monitor runs of PVCs.  Notified provider, repeat EKG completed by Micah, NT.  Patient pending discharge, primary RN uncomfortable with this decision as of now.  Secure chat sent to provider to see if second troponin that was collected but also canceled can be re-ordered and sent to lab. Awaiting response

## 2024-03-18 NOTE — ED Notes (Signed)
Awaiting IV team  

## 2024-03-18 NOTE — ED Triage Notes (Signed)
 Patient complains of pain in left wrist and hand x 1 week. Patient denies injury or fall. Patient unable to move wrist and fingers. States his index finger feels numb. States he was seen at [facility] yesterday and had xray. No fracture per family and was referred to go to ED for further evaluation. Patients family also states patient has a pressure ulcer to his sacral/buttocks area and would like that looked at as well. Has appt at [facility] coming up for eval. Rates pain 6/10.

## 2024-03-18 NOTE — Progress Notes (Signed)
  IV team consult received for IV start- pt noted only have orders for labs (no meds or scans) at this time. Hondo to primary RN to contact phlebotomy for assistance.  IV Team unable to assist with lab draws.

## 2024-03-18 NOTE — Discharge Instructions (Addendum)
 Continue to take torsemide  as prescribed.  Elevate hand on pillows at nighttime to help with swelling.  May also use Voltaren  gel on hand/wrist.  Begin taking Keflex  as prescribed as well.  This is an antibiotic that we are treating empirically to see if this helps with any swelling as well.  Please begin taking the steroid as prescribed to help with swelling as well.  This will elevate his blood sugar slightly.    Please follow-up with PCP in 1 week for reevaluation of arm.  Please try to have labs redrawn in 1 week to check heart failure level and for medication adjustment as needed.  Please return to ED if any symptoms worsen including severe swelling spreading up the arm, redness to the arm, new fevers, difficulty breathing.

## 2024-03-19 NOTE — ED Notes (Signed)
 Pt axox4. GCS 15 after waking.  Daughter of patient verbalizes understanding of discharge instructions and follow up. Pt escorted out via wheelchair by primary rn and NT to transportation home with daughter.

## 2024-03-27 ENCOUNTER — Ambulatory Visit (HOSPITAL_COMMUNITY): Admission: RE | Admit: 2024-03-27 | Discharge: 2024-03-27 | Attending: Cardiology

## 2024-03-27 ENCOUNTER — Telehealth: Payer: Self-pay | Admitting: Diagnostic Neuroimaging

## 2024-03-27 DIAGNOSIS — I5022 Chronic systolic (congestive) heart failure: Secondary | ICD-10-CM

## 2024-03-27 LAB — BASIC METABOLIC PANEL WITH GFR
Anion gap: 9 (ref 5–15)
BUN: 27 mg/dL — ABNORMAL HIGH (ref 8–23)
CO2: 32 mmol/L (ref 22–32)
Calcium: 9.4 mg/dL (ref 8.9–10.3)
Chloride: 99 mmol/L (ref 98–111)
Creatinine, Ser: 2.08 mg/dL — ABNORMAL HIGH (ref 0.61–1.24)
GFR, Estimated: 30 mL/min — ABNORMAL LOW (ref >60)
Glucose, Bld: 246 mg/dL — ABNORMAL HIGH (ref 70–99)
Potassium: 4 mmol/L (ref 3.5–5.1)
Sodium: 140 mmol/L (ref 135–145)

## 2024-03-27 NOTE — Telephone Encounter (Signed)
 Patient's daughter Allen Dyer calling to schedule an appointment for Botox. Physical Medicine Rehab physician recommended patient get Botox in jaw for stroke.  Informed would need a referral send over.

## 2024-03-27 NOTE — Telephone Encounter (Signed)
 Patient was last seen August of 2025 - FYI

## 2024-03-28 ENCOUNTER — Ambulatory Visit (HOSPITAL_COMMUNITY): Payer: Self-pay | Admitting: Family Medicine

## 2024-03-31 ENCOUNTER — Other Ambulatory Visit: Payer: Self-pay | Admitting: Physical Medicine and Rehabilitation

## 2024-03-31 DIAGNOSIS — K117 Disturbances of salivary secretion: Secondary | ICD-10-CM

## 2024-03-31 DIAGNOSIS — I639 Cerebral infarction, unspecified: Secondary | ICD-10-CM

## 2024-04-02 NOTE — Telephone Encounter (Signed)
 May try oral anticholinergic medications, such as glycopyrrolate low dose first? Even have liquid form

## 2024-04-03 NOTE — Telephone Encounter (Signed)
 Just wanted to follow up on this as we got the referral from PM&R for Botox in the parotid gland for drooling. Do we need to get pt set up for a f/u appt to discuss? OR do want to try the oral med first

## 2024-04-07 ENCOUNTER — Telehealth (HOSPITAL_COMMUNITY): Payer: Self-pay | Admitting: Cardiology

## 2024-04-07 NOTE — Telephone Encounter (Signed)
 Pts daughter called to report low b/p reading and L hand swelling   Reports this is the second or third low b/p reading over the past 2 weeks  Today 99/48 88/51- held PM dose of coreg   Pt is asymptomatic No dizziness, no HA, no increased fatigue, no light headiness  Weight stable at 180  Denies new medications besides small round of abx and steroids given in ER few weeks ago  Also report swelling to L hand -denies LE edema -denies SOB -denies CP   Reports compliance with torsemide  60/40 -seen in ER for hand swelling few weeks ago -given dose of IV lasix   Current follow up 05/12/24  Please advise

## 2024-04-08 NOTE — Telephone Encounter (Signed)
Pt aware via daughter.

## 2024-04-10 ENCOUNTER — Other Ambulatory Visit: Payer: Self-pay | Admitting: Physical Medicine and Rehabilitation

## 2024-04-10 DIAGNOSIS — K117 Disturbances of salivary secretion: Secondary | ICD-10-CM | POA: Insufficient documentation

## 2024-04-10 MED ORDER — GLYCOPYRROLATE 1 MG PO TABS: 1.0000 mg | ORAL_TABLET | Freq: Three times a day (TID) | ORAL | 3 refills | Status: DC

## 2024-04-14 ENCOUNTER — Other Ambulatory Visit: Payer: Self-pay

## 2024-04-15 ENCOUNTER — Encounter (HOSPITAL_BASED_OUTPATIENT_CLINIC_OR_DEPARTMENT_OTHER): Attending: General Surgery | Admitting: General Surgery

## 2024-04-15 DIAGNOSIS — I639 Cerebral infarction, unspecified: Secondary | ICD-10-CM | POA: Insufficient documentation

## 2024-04-15 DIAGNOSIS — E11622 Type 2 diabetes mellitus with other skin ulcer: Secondary | ICD-10-CM | POA: Insufficient documentation

## 2024-04-15 DIAGNOSIS — L89159 Pressure ulcer of sacral region, unspecified stage: Secondary | ICD-10-CM | POA: Insufficient documentation

## 2024-04-15 DIAGNOSIS — I5042 Chronic combined systolic (congestive) and diastolic (congestive) heart failure: Secondary | ICD-10-CM | POA: Insufficient documentation

## 2024-05-06 ENCOUNTER — Encounter (HOSPITAL_BASED_OUTPATIENT_CLINIC_OR_DEPARTMENT_OTHER): Admitting: General Surgery

## 2024-05-12 ENCOUNTER — Other Ambulatory Visit: Payer: Self-pay | Admitting: Physical Medicine and Rehabilitation

## 2024-05-12 ENCOUNTER — Telehealth: Payer: Self-pay

## 2024-05-12 ENCOUNTER — Ambulatory Visit (HOSPITAL_COMMUNITY): Admitting: Cardiology

## 2024-05-12 ENCOUNTER — Telehealth: Payer: Self-pay | Admitting: Physical Medicine and Rehabilitation

## 2024-05-12 MED ORDER — TAMSULOSIN HCL 0.4 MG PO CAPS: 0.4000 mg | ORAL_CAPSULE | Freq: Two times a day (BID) | ORAL | 0 refills | Status: DC

## 2024-05-12 MED ORDER — GLIPIZIDE 5 MG PO TABS: 5.0000 mg | ORAL_TABLET | Freq: Two times a day (BID) | ORAL | 3 refills | Status: AC

## 2024-05-12 MED ORDER — SCOPOLAMINE 1 MG/3DAYS TD PT72: 1.0000 | MEDICATED_PATCH | TRANSDERMAL | 12 refills | Status: DC

## 2024-05-12 MED ORDER — ISOSORBIDE MONONITRATE ER 30 MG PO TB24: 15.0000 mg | ORAL_TABLET | Freq: Every morning | ORAL | 3 refills | Status: DC

## 2024-05-12 MED ORDER — POTASSIUM CHLORIDE CRYS ER 20 MEQ PO TBCR: EXTENDED_RELEASE_TABLET | ORAL | 3 refills | Status: AC

## 2024-05-12 MED ORDER — TORSEMIDE 20 MG PO TABS: ORAL_TABLET | ORAL | 3 refills | Status: DC

## 2024-05-12 MED ORDER — CARVEDILOL 6.25 MG PO TABS: 6.2500 mg | ORAL_TABLET | Freq: Two times a day (BID) | ORAL | 5 refills | Status: AC

## 2024-05-12 MED ORDER — GLYCOPYRROLATE 1 MG PO TABS: 1.0000 mg | ORAL_TABLET | Freq: Three times a day (TID) | ORAL | 3 refills | Status: AC

## 2024-05-12 MED ORDER — ASPIRIN 81 MG PO CHEW: 81.0000 mg | CHEWABLE_TABLET | Freq: Every day | ORAL | 3 refills | Status: AC

## 2024-05-12 MED ORDER — EZETIMIBE 10 MG PO TABS: 10.0000 mg | ORAL_TABLET | Freq: Every day | ORAL | 3 refills | Status: AC

## 2024-05-12 NOTE — Progress Notes (Unsigned)
 "  Evaluation Performed:  Follow-up visit  Date:  05/13/2024   ID:  Allen Dyer, DOB 10/08/1935, MRN [id number]  PCP:  Regino Slater, MD  Cardiologist:  Ezra Shuck, MD Sleep Medicine: Wilbert Bihari, MD Electrophysiologist:  None   Chief Complaint:  OSA and HTN  History of Present Illness:    Allen Dyer is a 89 y.o. male with a history of type 2 diabetes mellitus, rheumatic fever, hypertension, peripheral neuropathy with edema, hyperlipidemia, chronic CKD stage III, prostate CA status post XRT.   2D echo 05/29/2018 showed moderate to severe LV dysfunction with EF 30 to 35% with moderately dilated LV and grade 2 diastolic dysfunction, moderate pulmonary hypertension with PASP 59 mmHg.   He underwent R/L heart cath showing mildly elevated right and left heart filling pressures with moderate pulmonary venous HTN with normal PVR 1.7 and preserved CO. He had nonobstructive CAD with 50% pLAD.  He was changed form Lasix  to Torsemide  40mg  BID.   Repeat 2D echo on guideline directed HF therapy revealed persistent LV dysfunction with EF 25-30% and grade 2 DD and mild to moderate AR.  He is followed in AHF clinic.     He was dx with severe OSA with an AHI of 59/hr and nocturnal hypoxemia with O2 desats as low as 63% and was titrated and now on BiPAP at 19/15cm H2O.  He ultimately was changed to auto BiPAP with IPAP max 20cm H2O, EPAP min 5cm H2O and PS 4cm H2O.    He is doing well with his PAP device. He has problems with the face mask fitting his face.  He has had a CVA and it has been complicated by numbness of that side of his face as well as drooling so he cannot keep a good seal on his face.  He also is having problems with his eye watering as well.  He says that the inside of his face feels swollen. He is going to go to Neuro for possible botox for his drooling.  He feels the pressure is adequate.  He feels rested in the am but gets sleepy during the day and has to sleep a lot.  He denies any  significant mouth or nasal dryness or nasal congestion.  He does not think that he snores. An Epworth Sleepiness Scale score was calculated the office today and this endorsed at 20 arguing against residual daytime sleepiness. Prior CV studies:   The following studies were reviewed today:  PAP compliance download  Past Medical History:  Diagnosis Date   Chronic combined systolic and diastolic CHF (congestive heart failure) (HCC)    CKD (chronic kidney disease), stage III (HCC)    Hyperlipidemia    Hypertension    Lower extremity edema    Mild CAD    a. mild-mod by cath 05/2018.   Multiple myeloma (HCC) 11/15/2018   NICM (nonischemic cardiomyopathy) (HCC)    Peripheral neuropathy    Prostate cancer (HCC)    Status post XRT   Rheumatic fever    Stroke [facility])    Trifascicular block    Type 2 diabetes mellitus (HCC)    Vertigo    Past Surgical History:  Procedure Laterality Date   RIGHT HEART CATH N/A 07/10/2018   Procedure: RIGHT HEART CATH;  Surgeon: Shuck Ezra RAMAN, MD;  Location: [facility] INVASIVE CV LAB;  Service: Cardiovascular;  Laterality: N/A;   RIGHT/LEFT HEART CATH AND CORONARY ANGIOGRAPHY N/A 06/18/2018   Procedure: RIGHT/LEFT HEART CATH AND CORONARY ANGIOGRAPHY;  Surgeon:  Burnard Debby LABOR, MD;  Location: [facility] INVASIVE CV LAB;  Service: Cardiovascular;  Laterality: N/A;     Current Meds  Medication Sig   acetaminophen  (TYLENOL ) 325 MG tablet Take 2 tablets (650 mg total) by mouth every 4 (four) hours as needed for mild pain (pain score 1-3) (or temp > 37.5 C (99.5 F)).   Ascorbic Acid (VITAMIN C) 1000 MG tablet Take 4,000 mg by mouth every morning.   aspirin  81 MG chewable tablet Chew 1 tablet (81 mg total) by mouth daily.   Blood Glucose Monitoring Suppl (FREESTYLE LITE) w/Device KIT 1 Device by Does not apply route daily in the afternoon.   carvedilol  (COREG ) 6.25 MG tablet Take 1 tablet (6.25 mg total) by mouth 2 (two) times daily with a meal.   clopidogrel  (PLAVIX ) 75 MG  tablet Take 1 tablet (75 mg total) by mouth daily.   cyanocobalamin  1000 MCG tablet Take 1 tablet (1,000 mcg total) by mouth every morning.   ezetimibe  (ZETIA ) 10 MG tablet Take 1 tablet (10 mg total) by mouth daily.   Garlic 1000 MG CAPS Take 1,000 mg by mouth every morning.   glipiZIDE  (GLUCOTROL ) 5 MG tablet Take 1 tablet (5 mg total) by mouth 2 (two) times daily before a meal.   glucose blood (FREESTYLE LITE) test strip 1 each by Other route daily in the afternoon. Use as instructed   hydrALAZINE  (APRESOLINE ) 25 MG tablet Take 0.5 tablets (12.5 mg total) by mouth 3 (three) times daily.   isosorbide  mononitrate (IMDUR ) 30 MG 24 hr tablet Take 1 tablet (30 mg total) by mouth every morning.   Omega-3 Fatty Acids (FISH OIL) 1200 MG CAPS Take 1,200 mg by mouth daily.   potassium chloride  SA (KLOR-CON  M) 20 MEQ tablet Take 2 tablets (40 mEq total) by mouth every morning AND 1 tablet (20 mEq total) every evening.   tamsulosin  (FLOMAX ) 0.4 MG CAPS capsule Take 1 capsule (0.4 mg total) by mouth 2 (two) times daily.   torsemide  (DEMADEX ) 20 MG tablet Take 3 tablets (60 mg total) by mouth 2 (two) times daily.     Allergies:   Tramadol, Finasteride, Jardiance  [empagliflozin ], Lisinopril, Ciprofloxacin, Simvastatin, and Sulfa antibiotics   Social History   Tobacco Use   Smoking status: Former    Current packs/day: 0.00    Average packs/day: 2.0 packs/day for 10.0 years (20.0 ttl pk-yrs)    Types: Cigarettes    Start date: 1963    Quit date: 14    Years since quitting: 53.0   Smokeless tobacco: Never  Vaping Use   Vaping status: Never Used  Substance Use Topics   Alcohol use: Not Currently    Comment: quit in 1975, no h/o heavy use   Drug use: Never     Family Hx: The patient's family history includes Cancer in his mother and sister; Congestive Heart Failure in his brother and father. There is no history of Stroke.  ROS:   Please see the history of present illness.     All other  systems reviewed and are negative.   Labs/Other Tests and Data Reviewed:    Recent Labs: 09/09/2023: TSH 2.615 01/30/2024: B Natriuretic Peptide 670.1 03/18/2024: ALT 9; Hemoglobin 10.2; Platelets 217 05/13/2024: BUN 21; Creatinine, Ser 1.79; Potassium 3.5; Pro Brain Natriuretic Peptide 2,546.0; Sodium 141   Recent Lipid Panel Lab Results  Component Value Date/Time   CHOL 101 05/13/2024 09:30 AM   TRIG 75 05/13/2024 09:30 AM   HDL 38 (L) 05/13/2024  09:30 AM   CHOLHDL 2.6 05/13/2024 09:30 AM   LDLCALC 48 05/13/2024 09:30 AM    Wt Readings from Last 3 Encounters:  05/13/24 188 lb (85.3 kg)  05/13/24 185 lb 3.2 oz (84 kg)  03/18/24 195 lb 15.8 oz (88.9 kg)    EKG Interpretation Date/Time:  Tuesday May 13 2024 13:33:27 EST Ventricular Rate:  79 PR Interval:  202 QRS Duration:  160 QT Interval:  410 QTC Calculation: 470 R Axis:   -75  Text Interpretation: Sinus rhythm with sinus arrhythmia with occasional Premature ventricular complexes Left axis deviation Right bundle branch block Possible Anteroseptal infarct (cited on or before 13-May-2024) When compared with ECG of 13-May-2024 09:08, Premature ventricular complexes are now Present Questionable change in initial forces of Anteroseptal leads Confirmed by Shlomo Corning ([id number]) on 05/13/2024 1:44:17 PM      Objective:    Vital Signs:  BP (!) 90/50   Pulse 76   Ht 5' 7 (1.702 m)   Wt 188 lb (85.3 kg)   SpO2 94%   BMI 29.44 kg/m   GEN: Well nourished, well developed in no acute distress HEENT: Normal NECK: No JVD; No carotid bruits LYMPHATICS: No lymphadenopathy CARDIAC:RRR, no murmurs, rubs, gallops occasional ectopy RESPIRATORY:  Clear to auscultation without rales, wheezing or rhonchi  ABDOMEN: Soft, non-tender, non-distended MUSCULOSKELETAL:  No edema; No deformity  SKIN: Warm and dry NEUROLOGIC:  Alert and oriented x 3 PSYCHIATRIC:  Normal affect  ASSESSMENT & PLAN:    OSA - The patient is tolerating PAP  therapy well without any problems. The PAP download performed by his DME was personally reviewed and interpreted by me today and showed an AHI of 9.4/hr on auto BiPAP with IPAP max 20cm H2O, EPAP min 5cm H2O and PS 4cm H2O cm H2O with 33% compliance in using more than 4 hours nightly.  The patient has been using and benefiting from PAP use and will continue to benefit from therapy.  -I am going to increase EPAP min to 8cm H2O -he has had a lot of drooling from his CVA and I think we will have a hard time finding a mask that will seal.  I am going to order him a nasal pillow mask to see if we can get a better fit.  I will hold off on chin strap given all his drooling until he gets the Botox  HTN -BP controlled on exam today -continue Carvedilol  6.25mg  BID and Imdur  15mg  daily with PRN refills   Medication Adjustments/Labs and Tests Ordered: Current medicines are reviewed at length with the patient today.  Concerns regarding medicines are outlined above.  Tests Ordered: Orders Placed This Encounter  Procedures   EKG 12-Lead    Medication Changes: No orders of the defined types were placed in this encounter.    Disposition:  Follow up in 2 months  Signed, Corning Shlomo, MD  05/13/2024 1:43 PM    [facility] "

## 2024-05-12 NOTE — Telephone Encounter (Signed)
 SABRA

## 2024-05-12 NOTE — Telephone Encounter (Signed)
 Pt daughter call asking for a med refill and pt has been out of his meds for two weeks

## 2024-05-13 ENCOUNTER — Encounter (HOSPITAL_COMMUNITY): Payer: Self-pay | Admitting: Cardiology

## 2024-05-13 ENCOUNTER — Encounter: Payer: Self-pay | Admitting: Cardiology

## 2024-05-13 ENCOUNTER — Ambulatory Visit: Admitting: Cardiology

## 2024-05-13 ENCOUNTER — Other Ambulatory Visit: Payer: Self-pay | Admitting: Physical Medicine and Rehabilitation

## 2024-05-13 ENCOUNTER — Ambulatory Visit: Admission: RE | Admit: 2024-05-13 | Discharge: 2024-05-13 | Attending: Cardiology | Admitting: Cardiology

## 2024-05-13 ENCOUNTER — Ambulatory Visit (HOSPITAL_COMMUNITY): Payer: Self-pay | Admitting: Cardiology

## 2024-05-13 ENCOUNTER — Encounter: Payer: Self-pay | Admitting: Physical Medicine and Rehabilitation

## 2024-05-13 VITALS — BP 130/60 | HR 77 | Wt 185.2 lb

## 2024-05-13 VITALS — BP 90/50 | HR 76 | Ht 67.0 in | Wt 188.0 lb

## 2024-05-13 DIAGNOSIS — E7800 Pure hypercholesterolemia, unspecified: Secondary | ICD-10-CM | POA: Diagnosis not present

## 2024-05-13 DIAGNOSIS — M7989 Other specified soft tissue disorders: Secondary | ICD-10-CM | POA: Diagnosis not present

## 2024-05-13 DIAGNOSIS — G4733 Obstructive sleep apnea (adult) (pediatric): Secondary | ICD-10-CM | POA: Insufficient documentation

## 2024-05-13 DIAGNOSIS — Z7984 Long term (current) use of oral hypoglycemic drugs: Secondary | ICD-10-CM | POA: Diagnosis not present

## 2024-05-13 DIAGNOSIS — I428 Other cardiomyopathies: Secondary | ICD-10-CM | POA: Diagnosis not present

## 2024-05-13 DIAGNOSIS — E1122 Type 2 diabetes mellitus with diabetic chronic kidney disease: Secondary | ICD-10-CM | POA: Diagnosis not present

## 2024-05-13 DIAGNOSIS — N184 Chronic kidney disease, stage 4 (severe): Secondary | ICD-10-CM | POA: Diagnosis not present

## 2024-05-13 DIAGNOSIS — I5042 Chronic combined systolic (congestive) and diastolic (congestive) heart failure: Secondary | ICD-10-CM | POA: Diagnosis not present

## 2024-05-13 DIAGNOSIS — I130 Hypertensive heart and chronic kidney disease with heart failure and stage 1 through stage 4 chronic kidney disease, or unspecified chronic kidney disease: Secondary | ICD-10-CM | POA: Diagnosis not present

## 2024-05-13 DIAGNOSIS — R531 Weakness: Secondary | ICD-10-CM | POA: Diagnosis not present

## 2024-05-13 DIAGNOSIS — I2510 Atherosclerotic heart disease of native coronary artery without angina pectoris: Secondary | ICD-10-CM | POA: Insufficient documentation

## 2024-05-13 DIAGNOSIS — E1142 Type 2 diabetes mellitus with diabetic polyneuropathy: Secondary | ICD-10-CM | POA: Insufficient documentation

## 2024-05-13 DIAGNOSIS — D472 Monoclonal gammopathy: Secondary | ICD-10-CM | POA: Insufficient documentation

## 2024-05-13 DIAGNOSIS — I5022 Chronic systolic (congestive) heart failure: Secondary | ICD-10-CM | POA: Insufficient documentation

## 2024-05-13 DIAGNOSIS — E8770 Fluid overload, unspecified: Secondary | ICD-10-CM | POA: Diagnosis not present

## 2024-05-13 DIAGNOSIS — I4891 Unspecified atrial fibrillation: Secondary | ICD-10-CM | POA: Diagnosis not present

## 2024-05-13 DIAGNOSIS — I10 Essential (primary) hypertension: Secondary | ICD-10-CM | POA: Diagnosis not present

## 2024-05-13 DIAGNOSIS — Z8673 Personal history of transient ischemic attack (TIA), and cerebral infarction without residual deficits: Secondary | ICD-10-CM | POA: Insufficient documentation

## 2024-05-13 DIAGNOSIS — I4510 Unspecified right bundle-branch block: Secondary | ICD-10-CM | POA: Diagnosis not present

## 2024-05-13 DIAGNOSIS — Z79899 Other long term (current) drug therapy: Secondary | ICD-10-CM | POA: Insufficient documentation

## 2024-05-13 DIAGNOSIS — K117 Disturbances of salivary secretion: Secondary | ICD-10-CM

## 2024-05-13 DIAGNOSIS — Z87891 Personal history of nicotine dependence: Secondary | ICD-10-CM | POA: Insufficient documentation

## 2024-05-13 DIAGNOSIS — Z7982 Long term (current) use of aspirin: Secondary | ICD-10-CM | POA: Insufficient documentation

## 2024-05-13 DIAGNOSIS — I779 Disorder of arteries and arterioles, unspecified: Secondary | ICD-10-CM | POA: Insufficient documentation

## 2024-05-13 LAB — PRO BRAIN NATRIURETIC PEPTIDE: Pro Brain Natriuretic Peptide: 2546 pg/mL — ABNORMAL HIGH

## 2024-05-13 LAB — LIPID PANEL
Cholesterol: 101 mg/dL (ref 0–200)
HDL: 38 mg/dL — ABNORMAL LOW
LDL Cholesterol: 48 mg/dL (ref 0–99)
Total CHOL/HDL Ratio: 2.6 ratio
Triglycerides: 75 mg/dL
VLDL: 15 mg/dL (ref 0–40)

## 2024-05-13 LAB — BASIC METABOLIC PANEL WITH GFR
Anion gap: 10 (ref 5–15)
BUN: 21 mg/dL (ref 8–23)
CO2: 33 mmol/L — ABNORMAL HIGH (ref 22–32)
Calcium: 10 mg/dL (ref 8.9–10.3)
Chloride: 98 mmol/L (ref 98–111)
Creatinine, Ser: 1.79 mg/dL — ABNORMAL HIGH (ref 0.61–1.24)
GFR, Estimated: 36 mL/min — ABNORMAL LOW
Glucose, Bld: 78 mg/dL (ref 70–99)
Potassium: 3.5 mmol/L (ref 3.5–5.1)
Sodium: 141 mmol/L (ref 135–145)

## 2024-05-13 MED ORDER — CLOPIDOGREL BISULFATE 75 MG PO TABS: 75.0000 mg | ORAL_TABLET | Freq: Every day | ORAL | 3 refills | Status: AC

## 2024-05-13 MED ORDER — TAMSULOSIN HCL 0.4 MG PO CAPS: 0.4000 mg | ORAL_CAPSULE | Freq: Two times a day (BID) | ORAL | 0 refills | Status: AC

## 2024-05-13 MED ORDER — TORSEMIDE 20 MG PO TABS: 60.0000 mg | ORAL_TABLET | Freq: Two times a day (BID) | ORAL | 3 refills | Status: AC

## 2024-05-13 MED ORDER — HYDRALAZINE HCL 25 MG PO TABS: 12.5000 mg | ORAL_TABLET | Freq: Three times a day (TID) | ORAL | 3 refills | Status: AC

## 2024-05-13 MED ORDER — ISOSORBIDE MONONITRATE ER 30 MG PO TB24: 30.0000 mg | ORAL_TABLET | Freq: Every morning | ORAL | 3 refills | Status: AC

## 2024-05-13 NOTE — Patient Instructions (Signed)
 CHANGE Torsemide  to 60 mg Twice daily  CHANGE Imdur  to 30 mg daily.  START Hydralazine  12.5 mg ( 1/2 Tab) Three times a day  Labs done today, your results will be available in MyChart, we will contact you for abnormal readings.  REPEAT blood work in 10 days.  You have been referred to the Lipid Clinic. They will call you to arrange your appointment.  Your provider has ordered a ultra sound of your left arm. You will be called to have this test arranged.  Your physician recommends that you schedule a follow-up appointment in: 3 months.  If you have any questions or concerns before your next appointment please send us  a message through [facility] or call our office at [phone].    TO LEAVE A MESSAGE FOR THE NURSE SELECT OPTION 2, PLEASE LEAVE A MESSAGE INCLUDING: YOUR NAME DATE OF BIRTH CALL BACK NUMBER REASON FOR CALL**this is important as we prioritize the call backs  YOU WILL RECEIVE A CALL BACK THE SAME DAY AS LONG AS YOU CALL BEFORE 4:00 PM  At the [facility], you and your health needs are our priority. As part of our continuing mission to provide you with exceptional heart care, we have created designated Provider Care Teams. These Care Teams include your primary Cardiologist (physician) and Advanced Practice Providers (APPs- Physician Assistants and Nurse Practitioners) who all work together to provide you with the care you need, when you need it.   You may see any of the following providers on your designated Care Team at your next follow up: Dr Toribio Fuel Dr Ezra Shuck Dr. Morene Brownie Greig Mosses, NP Caffie Shed, [place] [facility] [place], [place] Beckey Coe, NP Jordan Lee, NP Ellouise Class, NP Tinnie Redman, PharmD Jaun Bash, PharmD   Please be sure to bring in all your medications bottles to every appointment.    Thank you for choosing [facility]

## 2024-05-13 NOTE — Progress Notes (Signed)
 "    [facility] PCP:  Regino Slater, MD  Primary Cardiologist:  Wilbert Bihari, MD Nephrology: Dr. Dennise SERUM HF Cardiologist: Dr. Rolan   Chief complaint: CHF  HPI: Allen Dyer is a 89 y.o. male with a history of DM, rheumatic fever, HTN, hyperlipidemia, CKD IV, prostate CA s/p XRT, systolic HF due to NICM (EF 30-35%) 05/2018, pulmonary venous HTN, and mild nonobstructive CAD.  He served for > 20 years in the [employer] including in [place].    Admitted 3/11-3/18/20 with volume overload. Diuresed with IV lasix . HF team consulted with concern for pulmonary HTN due to ongoing oxygen need. RHC completed and showed moderate pulmonary venous HTN with normal PVR 1.7 (no PAH).   Patient was diagnosed with smoldering myeloma in 7/20, no treatment planned at this time.   Cardiolite in 2/21 showed EF 36%, fixed anteroapical defect, no ischemia.  Echo in 11/21 showed EF 30-35%, diffuse hypokinesis, normal RV, PASP 45 mmHg, dilated IVC.   Seen in ED 08/05/21 with blurred vision. Felt to be lightheadedness vs vertigo. Orthostatics negative. He had PCP follow up and was diagnosed with vertigo.  CVA in 5/23, echo showed EF 35-40%, mild LVH, normal RV.  Carotid dopplers showed minimal disease.  MRA head showed severe vertebral artery disease bilaterally.   Admitted 5/25 with CVA. MRI showed subacute stroke. CT angiogram showed significant vertebral artery stenosis. Echo showed EF 25-30%, G1DD, normal RV. He was discharged to CIR. Re-admitted 6/25 with new CVA, as well as subacute strokes. Neuro recommending DAPT indefinitely, cardiac monitor placed at discharge.  Zio 7/25 showed most NSR, 5% PVC burden. Seen in ED 8/25 for a fall, CT head negative for bleed.  Today he returns for HF follow up with his daughter. He has left forearm and hand swelling. He had this before in 11/25, US  negative for DVT. Swelling improved, now is worsened again.  Patient uses a walker because of left-sided  weakness.  Generally, no dyspnea walking with his walker though he is not very active. Using compression stockings.  No chest pain.  Sleeps in a recliner long-term.  He is out of Flomax  and is having some trouble urinating.  Weight down 11 lbs.   ECG (personally reviewed): NSR, LAFB, RBBB (personally reviewed)  Labs (6/25): K 3.5, creatinine 1.65, LDL 73 Labs (9/25): K 3.8, creatinine 1.93, hgb 9.4, BNP 860 Labs (10/25): K 4.0, creatinine 1.83 Labs (12/25): K 4, creatinine 2.08  PMH: 1. CKD: Stage 4.  2. Hyperlipidemia: Statin intolerant.  3. HTN 4. Smoldering myeloma: Diagnosed in 7/20.  - Bone marrow biopsy stained negative for amyloidosis.  5. Prostate cancer s/p XRT.  6. Type 2 diabetes.  7. Chronic systolic CHF: Nonischemic cardiomyopathy.  - RHC/LHC (2/20): Nonobstructive CAD with 50% pLAD; mean RA 13, PA 72/27 mean 47, mean PCWP 29, CI 2.8, PVR 3 WU - Echo (2/20): EF 30-35%.  - RHC (3/20): mean RA 9, PA 55/20 mean 32, mean PCWP 20, CI 3.29, PVR 1.7 WU - Echo (7/20): EF 25-30%, moderate LV dilation without LVH, normal RV size and systolic function, mild-moderate AI.  - Cardiolite (2/21): EF 36%, fixed anteroapical defect, no ischemia.  - Echo (11/21): EF 30-35%, diffuse hypokinesis, normal RV, PASP 45 mmHg, dilated IVC.  - Echo (3/23): EF 30-35%, moderately decreased LV with global HK, grade I DD, normal RV, ascending aorta dilation 44 mm. - Echo (5/23): EF 35-40%, mild LVH, normal RV - Echo (5/25): EF 25-30%, normal RV. 8.  Pulmonary venous hypertension.  - V/Q scan (3/20): No evidence for chronic PE.  9. OSA: Using CPAP.  10. ABIs normal in 1/23 11. CVA: 5/23, 5/25, 6/25.  - Carotid dopplers (5/23): minimal disease.  - MRA head (5/23): no flow right vertebral, severe stenosis left vertebral.  - Carotid dopplers (6/25): Minimal disease in the extracranial carotids.  - MRA head (6/25): Known bilateral vertebral artery occlusions. Unchanged advanced anterior circulation  atherosclerosis including severe right and moderate left ICA stenoses.  - Zio 7/25: mostly NSR, 5% PVC burden  Past Surgical History:  Procedure Laterality Date   RIGHT HEART CATH N/A 07/10/2018   Procedure: RIGHT HEART CATH;  Surgeon: [name], MD;  Location: [facility] INVASIVE CV LAB;  Service: Cardiovascular;  Laterality: N/A;   RIGHT/LEFT HEART CATH AND CORONARY ANGIOGRAPHY N/A 06/18/2018   Procedure: RIGHT/LEFT HEART CATH AND CORONARY ANGIOGRAPHY;  Surgeon: Burnard Debby LABOR, MD;  Location: [facility] INVASIVE CV LAB;  Service: Cardiovascular;  Laterality: N/A;   Current Outpatient Medications  Medication Sig Dispense Refill   acetaminophen  (TYLENOL ) 325 MG tablet Take 2 tablets (650 mg total) by mouth every 4 (four) hours as needed for mild pain (pain score 1-3) (or temp > 37.5 C (99.5 F)).     Ascorbic Acid (VITAMIN C) 1000 MG tablet Take 4,000 mg by mouth every morning.     aspirin  81 MG chewable tablet Chew 1 tablet (81 mg total) by mouth daily. 90 tablet 3   Blood Glucose Monitoring Suppl (FREESTYLE LITE) w/Device KIT 1 Device by Does not apply route daily in the afternoon. 1 kit 0   carvedilol  (COREG ) 6.25 MG tablet Take 1 tablet (6.25 mg total) by mouth 2 (two) times daily with a meal. 60 tablet 5   cyanocobalamin  1000 MCG tablet Take 1 tablet (1,000 mcg total) by mouth every morning. 30 tablet 0   ezetimibe  (ZETIA ) 10 MG tablet Take 1 tablet (10 mg total) by mouth daily. 90 tablet 3   Garlic 1000 MG CAPS Take 1,000 mg by mouth every morning.     glipiZIDE  (GLUCOTROL ) 5 MG tablet Take 1 tablet (5 mg total) by mouth 2 (two) times daily before a meal. 180 tablet 3   glucose blood (FREESTYLE LITE) test strip 1 each by Other route daily in the afternoon. Use as instructed 100 each 3   hydrALAZINE  (APRESOLINE ) 25 MG tablet Take 0.5 tablets (12.5 mg total) by mouth 3 (three) times daily. 270 tablet 3   Omega-3 Fatty Acids (FISH OIL) 1200 MG CAPS Take 1,200 mg by mouth daily.     potassium chloride   SA (KLOR-CON  M) 20 MEQ tablet Take 2 tablets (40 mEq total) by mouth every morning AND 1 tablet (20 mEq total) every evening. 180 tablet 3   clopidogrel  (PLAVIX ) 75 MG tablet Take 1 tablet (75 mg total) by mouth daily. 90 tablet 3   glycopyrrolate  (ROBINUL ) 1 MG tablet Take 1 tablet (1 mg total) by mouth 3 (three) times daily. (Patient not taking: Reported on 05/13/2024) 90 tablet 3   isosorbide  mononitrate (IMDUR ) 30 MG 24 hr tablet Take 1 tablet (30 mg total) by mouth every morning. 90 tablet 3   tamsulosin  (FLOMAX ) 0.4 MG CAPS capsule Take 1 capsule (0.4 mg total) by mouth 2 (two) times daily. 90 capsule 0   torsemide  (DEMADEX ) 20 MG tablet Take 3 tablets (60 mg total) by mouth 2 (two) times daily. 180 tablet 3   No current facility-administered medications for this encounter.  Allergies:   Tramadol, Finasteride, Jardiance  [empagliflozin ], Lisinopril, Ciprofloxacin, Simvastatin, and Sulfa antibiotics   Social History:  The patient  reports that he quit smoking about 53 years ago. His smoking use included cigarettes. He started smoking about 63 years ago. He has a 20 pack-year smoking history. He has never used smokeless tobacco. He reports that he does not currently use alcohol. He reports that he does not use drugs.   Family History:  The patient's family history includes Cancer in his mother and sister; Congestive Heart Failure in his brother and father.   ROS:  Please see the history of present illness.   All other systems are personally reviewed and negative.   Wt Readings from Last 3 Encounters:  05/13/24 85.3 kg (188 lb)  05/13/24 84 kg (185 lb 3.2 oz)  03/18/24 88.9 kg (195 lb 15.8 oz)   BP 130/60   Pulse 77   Wt 84 kg (185 lb 3.2 oz)   SpO2 96%   BMI 28.58 kg/m   Physical Exam General: NAD Neck: JVP 8 cm, no thyromegaly or thyroid  nodule.  Lungs: Clear to auscultation bilaterally with normal respiratory effort. CV: Nondisplaced PMI.  Heart regular S1/S2, no S3/S4, no  murmur.  1+ edema to knees.  No carotid bruit.  Normal pedal pulses.  Abdomen: Soft, nontender, no hepatosplenomegaly, no distention.  Skin: Intact without lesions or rashes.  Neurologic: Alert and oriented x 3.  Psych: Normal affect. Extremities: No clubbing or cyanosis. Left forearm and hand swelling.  HEENT: Normal.   Assessment & Plan: 1. Chronic systolic HF:  Diagnosed 05/2018, EF 30-35% by echo. Due to NICM. LHC 05/2018 with mild non-osbtructive CAD (50% proximal LAD stenosis). Prior hx of ETOH abuse, but none in 40+ years. Did not receive chemotherapy with prior prostate cancer. QRS has been wide on EKG, but it is RBBB.  He has smoldering myeloma but he does not have ventricular wall thickening that would be suggestive of cardiac amyloidosis.  RHC 3/20 with mildly elevated filling pressures, CO preserved, pulmonary venous HTN. No PAH.  Echo in 7/20 showed that EF remained low at 25-30% with moderate LV dilation, no LVH, and normal RV.  Echo in 11/21 showed EF 30-35%, diffuse hypokinesis, normal RV, PASP 45 mmHg, dilated IVC. Echo (3/23) with EF 30-35%, echo in 5/23 showed EF 35-40%, mild LVH, normal RV.  Echo 5/25 showed EF 25-30%, normal RV. NYHA class II-III, functional status confounded by CVA and deconditioning. Though weight is down, he looks at least mildly volume overloaded on exam.  GDMT limited by CKD stage IV.  - Increase torsemide  to 60 mg bid.  BMET/BNP today, BMET in 10 days.  - Continue carvedilol  6.25 mg bid.   - Restart hydralazine  12.5 mg tid and increase Imdur  to 30 mg daily.  - No MRA/ACE/ARB/ARNI with CKD stage 4 and baseline creatinine around 2.  - Off SGLT2-i with yeast infections/frequent UTIs. - With advanced age and nonischemic etiology of cardiomyopathy, would avoid ICD.  He has RBBB so would not likely benefit from CRT.  2. CKD Stage IV: Creatinine baseline around 2. - Follows with Nephrology - No SGLT2i with urinary retention/yeast infections - BMET today.  3.  CAD: 50% pLAD on LHC in 2/20. Cardiolite in 2/21 showed no ischemia.  No chest pain.  - Continue Zetia  10 mg daily. Check lipids today.  - He has been referred to lipid clinic for Repatha.   - Continue ASA 81.   4. Severe OSA: Needs  new mask for CPAP, to followup with Dr. Shlomo.  5. Pulmonary hypertension: He had pulmonary venous hypertension based on prior RHC.  6. Smoldering myeloma:  No plans for chemotherapy treatment.  - Sees Dr Lonn.  7. CVA: In 5/23.  He has severe vertebral artery disease.  New CVA 5/25 to bilateral cerebellar region and 6/25 to R pons. Zio monitor showed no AF/AFL.  - Continue DAPT with ASA/Plavix  indefinitely per Neuro - Continue Zetia . Has not tolerated statins.   Follow up in 3 months with APP  I spent 32 minutes reviewing records, interviewing/examining patient, and managing orders.   Ezra Shuck, MD  05/13/2024  "

## 2024-05-13 NOTE — Telephone Encounter (Signed)
 I called and let them know. The wife said Dr. Mccalin had prescribe plavix . Also, she mentioned that haven't heard from [facility] and when the called they told them that you needed to send a referral

## 2024-05-13 NOTE — Patient Instructions (Signed)
 Medication Instructions:  Your physician recommends that you continue on your current medications as directed. Please refer to the Current Medication list given to you today.  *If you need a refill on your cardiac medications before your next appointment, please call your pharmacy*  Lab Work: None.  If you have labs (blood work) drawn today and your tests are completely normal, you will receive your results only by: MyChart Message (if you have MyChart) OR A paper copy in the mail If you have any lab test that is abnormal or we need to change your treatment, we will call you to review the results.  Testing/Procedures: None.  Follow-Up: At [facility], you and your health needs are our priority.  As part of our continuing mission to provide you with exceptional heart care, our providers are all part of one team.  This team includes your primary Cardiologist (physician) and Advanced Practice Providers or APPs (Physician Assistants and Nurse Practitioners) who all work together to provide you with the care you need, when you need it.  Your next appointment:   2 month(s)  Provider:   Wilbert Bihari, MD

## 2024-05-15 ENCOUNTER — Telehealth (HOSPITAL_COMMUNITY): Payer: Self-pay | Admitting: Cardiology

## 2024-05-15 ENCOUNTER — Telehealth: Payer: Self-pay | Admitting: *Deleted

## 2024-05-15 ENCOUNTER — Ambulatory Visit (HOSPITAL_COMMUNITY)
Admission: RE | Admit: 2024-05-15 | Discharge: 2024-05-15 | Disposition: A | Discharge status: Home / Self Care | Source: Ambulatory Visit | Attending: Cardiology | Admitting: Cardiology

## 2024-05-15 DIAGNOSIS — I10 Essential (primary) hypertension: Secondary | ICD-10-CM

## 2024-05-15 DIAGNOSIS — G4733 Obstructive sleep apnea (adult) (pediatric): Secondary | ICD-10-CM

## 2024-05-15 DIAGNOSIS — I4891 Unspecified atrial fibrillation: Secondary | ICD-10-CM

## 2024-05-15 DIAGNOSIS — I5042 Chronic combined systolic (congestive) and diastolic (congestive) heart failure: Secondary | ICD-10-CM

## 2024-05-15 DIAGNOSIS — I5022 Chronic systolic (congestive) heart failure: Secondary | ICD-10-CM

## 2024-05-15 DIAGNOSIS — M7989 Other specified soft tissue disorders: Secondary | ICD-10-CM | POA: Insufficient documentation

## 2024-05-15 DIAGNOSIS — I272 Pulmonary hypertension, unspecified: Secondary | ICD-10-CM

## 2024-05-15 NOTE — Telephone Encounter (Signed)
 Pts daughter completed walk in form  On Apr 03, 2024 I called into the office because my dads BP was constantly low and he was not as active, so Dr Rolan actually took him off hydralazine  25 mg due to low readings.  When we ere here on 05/12/2024 Dr Rolan nor I knew at that time why my dad was not taking the meds. I realized it once I got home a read my notes from my call into the office. I did not know if another was another med he could be placed on instead of hydralazine .     Please advise

## 2024-05-15 NOTE — Telephone Encounter (Signed)
-----   Message from Wilbert Bihari, MD sent at 05/13/2024  1:16 PM EST ----- Please order a ResMed Airfit nasal pillow mask and also increase EPAP min to 8cm H2O.  Get a download in 4 weeks

## 2024-05-15 NOTE — Telephone Encounter (Signed)
 I put him back on a lower dose.  If he is getting dizzy/low BP on the lower dose, he may have to stop it.  If BP is ok, would continue.

## 2024-05-15 NOTE — Telephone Encounter (Signed)
Order placed to [place] via community message

## 2024-05-16 NOTE — Telephone Encounter (Signed)
 Pts daughter aware.

## 2024-05-21 ENCOUNTER — Encounter (HOSPITAL_COMMUNITY): Payer: Self-pay

## 2024-05-23 ENCOUNTER — Ambulatory Visit (HOSPITAL_COMMUNITY)

## 2024-05-30 ENCOUNTER — Telehealth (HOSPITAL_COMMUNITY): Payer: Self-pay

## 2024-05-30 ENCOUNTER — Ambulatory Visit (HOSPITAL_COMMUNITY): Admission: RE | Admit: 2024-05-30

## 2024-05-30 DIAGNOSIS — I5042 Chronic combined systolic (congestive) and diastolic (congestive) heart failure: Secondary | ICD-10-CM

## 2024-05-30 LAB — BASIC METABOLIC PANEL WITH GFR
Anion gap: 10 (ref 5–15)
BUN: 24 mg/dL — ABNORMAL HIGH (ref 8–23)
CO2: 33 mmol/L — ABNORMAL HIGH (ref 22–32)
Calcium: 9.7 mg/dL (ref 8.9–10.3)
Chloride: 98 mmol/L (ref 98–111)
Creatinine, Ser: 1.91 mg/dL — ABNORMAL HIGH (ref 0.61–1.24)
GFR, Estimated: 34 mL/min — ABNORMAL LOW
Glucose, Bld: 108 mg/dL — ABNORMAL HIGH (ref 70–99)
Potassium: 4.1 mmol/L (ref 3.5–5.1)
Sodium: 141 mmol/L (ref 135–145)

## 2024-05-30 NOTE — Telephone Encounter (Signed)
 If it helped to take him off, he can stay off it.  If it made no difference when she stopped it, he should restart it.

## 2024-05-30 NOTE — Telephone Encounter (Signed)
 Patient came in today for labs and his daughter stated she has chosen to stop the patients Hydralazine  sure to confusion and restlessness since he started it he took the last dose on Monday 05/26/24. Please advise if he needs a different medication or if it was okay to stop.

## 2024-05-30 NOTE — Telephone Encounter (Signed)
 Patient's daughter notified.

## 2024-06-16 ENCOUNTER — Encounter: Admitting: Physical Medicine and Rehabilitation

## 2024-06-27 ENCOUNTER — Ambulatory Visit: Admitting: Cardiology

## 2024-07-07 ENCOUNTER — Ambulatory Visit: Admitting: Pharmacist

## 2024-07-10 ENCOUNTER — Ambulatory Visit: Admitting: Podiatry

## 2024-07-15 ENCOUNTER — Ambulatory Visit: Admitting: Cardiology

## 2024-08-11 ENCOUNTER — Ambulatory Visit (HOSPITAL_COMMUNITY)

## 2025-01-02 ENCOUNTER — Ambulatory Visit: Admitting: Hematology and Oncology

## 2025-01-02 ENCOUNTER — Other Ambulatory Visit
